# Patient Record
Sex: Female | Born: 1965 | Race: Black or African American | Hispanic: No | Smoking: Former smoker
Health system: Southern US, Community
[De-identification: ages and names within clinical notes are randomized; demographics above are authoritative.]

## PROBLEM LIST (undated history)

## (undated) DIAGNOSIS — R0602 Shortness of breath: Secondary | ICD-10-CM

## (undated) DIAGNOSIS — K449 Diaphragmatic hernia without obstruction or gangrene: Secondary | ICD-10-CM

## (undated) DIAGNOSIS — D649 Anemia, unspecified: Secondary | ICD-10-CM

## (undated) DIAGNOSIS — K589 Irritable bowel syndrome without diarrhea: Secondary | ICD-10-CM

## (undated) DIAGNOSIS — M1990 Unspecified osteoarthritis, unspecified site: Secondary | ICD-10-CM

## (undated) DIAGNOSIS — R011 Cardiac murmur, unspecified: Secondary | ICD-10-CM

## (undated) DIAGNOSIS — K219 Gastro-esophageal reflux disease without esophagitis: Secondary | ICD-10-CM

## (undated) DIAGNOSIS — T4145XA Adverse effect of unspecified anesthetic, initial encounter: Secondary | ICD-10-CM

## (undated) DIAGNOSIS — I10 Essential (primary) hypertension: Secondary | ICD-10-CM

## (undated) DIAGNOSIS — N8110 Cystocele, unspecified: Secondary | ICD-10-CM

## (undated) DIAGNOSIS — C9000 Multiple myeloma not having achieved remission: Secondary | ICD-10-CM

## (undated) DIAGNOSIS — G629 Polyneuropathy, unspecified: Secondary | ICD-10-CM

## (undated) DIAGNOSIS — T8859XA Other complications of anesthesia, initial encounter: Secondary | ICD-10-CM

## (undated) DIAGNOSIS — E7800 Pure hypercholesterolemia, unspecified: Secondary | ICD-10-CM

## (undated) DIAGNOSIS — I639 Cerebral infarction, unspecified: Secondary | ICD-10-CM

## (undated) HISTORY — PX: ESOPHAGOGASTRODUODENOSCOPY: SHX1529

## (undated) HISTORY — DX: Pure hypercholesterolemia, unspecified: E78.00

## (undated) HISTORY — DX: Gastro-esophageal reflux disease without esophagitis: K21.9

## (undated) HISTORY — DX: Shortness of breath: R06.02

## (undated) HISTORY — DX: Unspecified osteoarthritis, unspecified site: M19.90

## (undated) HISTORY — PX: COLONOSCOPY: SHX174

## (undated) HISTORY — DX: Cardiac murmur, unspecified: R01.1

## (undated) HISTORY — DX: Other complications of anesthesia, initial encounter: T88.59XA

## (undated) HISTORY — PX: WISDOM TOOTH EXTRACTION: SHX21

## (undated) HISTORY — DX: Cystocele, unspecified: N81.10

## (undated) HISTORY — PX: UPPER GASTROINTESTINAL ENDOSCOPY: SHX188

## (undated) HISTORY — DX: Anemia, unspecified: D64.9

## (undated) HISTORY — PX: BONE BIOPSY: SHX375

## (undated) HISTORY — PX: TUBAL LIGATION: SHX77

---
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
## 2003-03-21 DIAGNOSIS — C9000 Multiple myeloma not having achieved remission: Secondary | ICD-10-CM

## 2003-03-21 HISTORY — PX: BONE BIOPSY: SHX375

## 2003-03-21 HISTORY — DX: Multiple myeloma not having achieved remission: C90.00

## 2015-03-21 DIAGNOSIS — I639 Cerebral infarction, unspecified: Secondary | ICD-10-CM

## 2015-03-21 HISTORY — DX: Cerebral infarction, unspecified: I63.9

## 2016-12-05 ENCOUNTER — Emergency Department (HOSPITAL_BASED_OUTPATIENT_CLINIC_OR_DEPARTMENT_OTHER): Payer: Self-pay

## 2016-12-05 ENCOUNTER — Inpatient Hospital Stay (HOSPITAL_BASED_OUTPATIENT_CLINIC_OR_DEPARTMENT_OTHER)
Admission: EM | Admit: 2016-12-05 | Discharge: 2016-12-08 | DRG: 065 | Disposition: A | Discharge status: Home Health | Payer: Self-pay | Attending: Internal Medicine | Admitting: Internal Medicine

## 2016-12-05 ENCOUNTER — Encounter (HOSPITAL_BASED_OUTPATIENT_CLINIC_OR_DEPARTMENT_OTHER): Payer: Self-pay | Admitting: *Deleted

## 2016-12-05 DIAGNOSIS — K449 Diaphragmatic hernia without obstruction or gangrene: Secondary | ICD-10-CM | POA: Diagnosis present

## 2016-12-05 DIAGNOSIS — I10 Essential (primary) hypertension: Secondary | ICD-10-CM

## 2016-12-05 DIAGNOSIS — I160 Hypertensive urgency: Secondary | ICD-10-CM | POA: Diagnosis present

## 2016-12-05 DIAGNOSIS — R519 Headache, unspecified: Secondary | ICD-10-CM

## 2016-12-05 DIAGNOSIS — Z823 Family history of stroke: Secondary | ICD-10-CM

## 2016-12-05 DIAGNOSIS — Z87891 Personal history of nicotine dependence: Secondary | ICD-10-CM

## 2016-12-05 DIAGNOSIS — Z885 Allergy status to narcotic agent status: Secondary | ICD-10-CM

## 2016-12-05 DIAGNOSIS — M79605 Pain in left leg: Secondary | ICD-10-CM

## 2016-12-05 DIAGNOSIS — I639 Cerebral infarction, unspecified: Secondary | ICD-10-CM

## 2016-12-05 DIAGNOSIS — G8194 Hemiplegia, unspecified affecting left nondominant side: Secondary | ICD-10-CM | POA: Diagnosis present

## 2016-12-05 DIAGNOSIS — E876 Hypokalemia: Secondary | ICD-10-CM | POA: Diagnosis not present

## 2016-12-05 DIAGNOSIS — Z599 Problem related to housing and economic circumstances, unspecified: Secondary | ICD-10-CM

## 2016-12-05 DIAGNOSIS — Z8249 Family history of ischemic heart disease and other diseases of the circulatory system: Secondary | ICD-10-CM

## 2016-12-05 DIAGNOSIS — R42 Dizziness and giddiness: Secondary | ICD-10-CM

## 2016-12-05 DIAGNOSIS — G629 Polyneuropathy, unspecified: Secondary | ICD-10-CM | POA: Diagnosis present

## 2016-12-05 DIAGNOSIS — C9000 Multiple myeloma not having achieved remission: Secondary | ICD-10-CM | POA: Diagnosis present

## 2016-12-05 DIAGNOSIS — Z9114 Patient's other noncompliance with medication regimen: Secondary | ICD-10-CM

## 2016-12-05 DIAGNOSIS — F1210 Cannabis abuse, uncomplicated: Secondary | ICD-10-CM | POA: Diagnosis present

## 2016-12-05 DIAGNOSIS — I5189 Other ill-defined heart diseases: Secondary | ICD-10-CM

## 2016-12-05 DIAGNOSIS — R51 Headache: Secondary | ICD-10-CM

## 2016-12-05 DIAGNOSIS — K589 Irritable bowel syndrome without diarrhea: Secondary | ICD-10-CM | POA: Diagnosis present

## 2016-12-05 DIAGNOSIS — R000 Tachycardia, unspecified: Secondary | ICD-10-CM

## 2016-12-05 DIAGNOSIS — Q211 Atrial septal defect: Secondary | ICD-10-CM

## 2016-12-05 DIAGNOSIS — H5500 Unspecified nystagmus: Secondary | ICD-10-CM | POA: Diagnosis present

## 2016-12-05 DIAGNOSIS — I6340 Cerebral infarction due to embolism of unspecified cerebral artery: Principal | ICD-10-CM | POA: Diagnosis present

## 2016-12-05 DIAGNOSIS — G8929 Other chronic pain: Secondary | ICD-10-CM | POA: Diagnosis present

## 2016-12-05 HISTORY — DX: Diaphragmatic hernia without obstruction or gangrene: K44.9

## 2016-12-05 HISTORY — DX: Irritable bowel syndrome, unspecified: K58.9

## 2016-12-05 HISTORY — DX: Polyneuropathy, unspecified: G62.9

## 2016-12-05 HISTORY — DX: Essential (primary) hypertension: I10

## 2016-12-05 HISTORY — DX: Multiple myeloma not having achieved remission: C90.00

## 2016-12-05 LAB — CBC WITH DIFFERENTIAL/PLATELET
BASOS PCT: 0 %
Basophils Absolute: 0 10*3/uL (ref 0.0–0.1)
EOS ABS: 0 10*3/uL (ref 0.0–0.7)
Eosinophils Relative: 0 %
HCT: 45 % (ref 36.0–46.0)
Hemoglobin: 15.7 g/dL — ABNORMAL HIGH (ref 12.0–15.0)
Lymphocytes Relative: 52 %
Lymphs Abs: 3.7 10*3/uL (ref 0.7–4.0)
MCH: 28.6 pg (ref 26.0–34.0)
MCHC: 34.9 g/dL (ref 30.0–36.0)
MCV: 82.1 fL (ref 78.0–100.0)
MONO ABS: 0.7 10*3/uL (ref 0.1–1.0)
MONOS PCT: 10 %
Neutro Abs: 2.8 10*3/uL (ref 1.7–7.7)
Neutrophils Relative %: 38 %
Platelets: 190 10*3/uL (ref 150–400)
RBC: 5.48 MIL/uL — ABNORMAL HIGH (ref 3.87–5.11)
RDW: 14.4 % (ref 11.5–15.5)
WBC: 7.2 10*3/uL (ref 4.0–10.5)

## 2016-12-05 LAB — COMPREHENSIVE METABOLIC PANEL
ALBUMIN: 4.6 g/dL (ref 3.5–5.0)
ALK PHOS: 99 U/L (ref 38–126)
ALT: 18 U/L (ref 14–54)
ANION GAP: 13 (ref 5–15)
AST: 34 U/L (ref 15–41)
BUN: 15 mg/dL (ref 6–20)
CHLORIDE: 103 mmol/L (ref 101–111)
CO2: 22 mmol/L (ref 22–32)
Calcium: 10.1 mg/dL (ref 8.9–10.3)
Creatinine, Ser: 0.94 mg/dL (ref 0.44–1.00)
GFR calc Af Amer: >60 mL/min (ref >60)
GFR calc non Af Amer: >60 mL/min (ref >60)
GLUCOSE: 101 mg/dL — AB (ref 65–99)
POTASSIUM: 3.5 mmol/L (ref 3.5–5.1)
SODIUM: 138 mmol/L (ref 135–145)
Total Bilirubin: 1.1 mg/dL (ref 0.3–1.2)
Total Protein: 8.7 g/dL — ABNORMAL HIGH (ref 6.5–8.1)

## 2016-12-05 LAB — LIPASE, BLOOD: Lipase: 54 U/L — ABNORMAL HIGH (ref 11–51)

## 2016-12-05 LAB — URINALYSIS, ROUTINE W REFLEX MICROSCOPIC
BILIRUBIN URINE: NEGATIVE
Glucose, UA: NEGATIVE mg/dL
KETONES UR: 15 mg/dL — AB
Leukocytes, UA: NEGATIVE
Nitrite: NEGATIVE
Protein, ur: NEGATIVE mg/dL
SPECIFIC GRAVITY, URINE: 1.015 (ref 1.005–1.030)
pH: 6 (ref 5.0–8.0)

## 2016-12-05 LAB — RAPID URINE DRUG SCREEN, HOSP PERFORMED
Amphetamines: NOT DETECTED
BARBITURATES: NOT DETECTED
BENZODIAZEPINES: NOT DETECTED
COCAINE: NOT DETECTED
Opiates: NOT DETECTED
Tetrahydrocannabinol: POSITIVE — AB

## 2016-12-05 LAB — URINALYSIS, MICROSCOPIC (REFLEX)

## 2016-12-05 LAB — ETHANOL: Alcohol, Ethyl (B): <5 mg/dL (ref <5)

## 2016-12-05 LAB — PROTIME-INR
INR: 0.96
PROTHROMBIN TIME: 12.7 s (ref 11.4–15.2)

## 2016-12-05 LAB — TROPONIN I

## 2016-12-05 LAB — APTT: aPTT: 41 seconds — ABNORMAL HIGH (ref 24–36)

## 2016-12-05 MED ORDER — SODIUM CHLORIDE 0.9 % IV SOLN
Freq: Once | INTRAVENOUS | Status: AC
  Administered 2016-12-05: 11:00:00 via INTRAVENOUS

## 2016-12-05 MED ORDER — ASPIRIN 300 MG RE SUPP: 300.0000 mg | Freq: Every day | RECTAL | Status: DC

## 2016-12-05 MED ORDER — SODIUM CHLORIDE 0.9 % IV SOLN
INTRAVENOUS | Status: DC
  Administered 2016-12-06 – 2016-12-08 (×3): via INTRAVENOUS

## 2016-12-05 MED ORDER — KETOROLAC TROMETHAMINE 15 MG/ML IJ SOLN
15.0000 mg | Freq: Once | INTRAMUSCULAR | Status: AC
  Administered 2016-12-05: 15 mg via INTRAVENOUS
  Filled 2016-12-05: qty 1

## 2016-12-05 MED ORDER — LABETALOL HCL 5 MG/ML IV SOLN
20.0000 mg | Freq: Once | INTRAVENOUS | Status: AC
  Administered 2016-12-05: 20 mg via INTRAVENOUS
  Filled 2016-12-05: qty 4

## 2016-12-05 MED ORDER — ACETAMINOPHEN 650 MG RE SUPP: 650.0000 mg | RECTAL | Status: DC | PRN

## 2016-12-05 MED ORDER — ASPIRIN 325 MG PO TABS
325.0000 mg | ORAL_TABLET | Freq: Every day | ORAL | Status: DC
  Administered 2016-12-06 – 2016-12-08 (×3): 325 mg via ORAL
  Filled 2016-12-05 (×3): qty 1

## 2016-12-05 MED ORDER — STROKE: EARLY STAGES OF RECOVERY BOOK
Freq: Once | Status: AC
  Administered 2016-12-06: 01:00:00

## 2016-12-05 MED ORDER — ACETAMINOPHEN 500 MG PO TABS
1000.0000 mg | ORAL_TABLET | Freq: Once | ORAL | Status: AC
  Administered 2016-12-05: 1000 mg via ORAL
  Filled 2016-12-05: qty 2

## 2016-12-05 MED ORDER — ACETAMINOPHEN 160 MG/5ML PO SOLN: 650.0000 mg | ORAL | Status: DC | PRN

## 2016-12-05 MED ORDER — ACETAMINOPHEN 325 MG PO TABS
650.0000 mg | ORAL_TABLET | ORAL | Status: DC | PRN
  Administered 2016-12-06 (×2): 650 mg via ORAL
  Filled 2016-12-05 (×2): qty 2

## 2016-12-05 MED ORDER — PROMETHAZINE HCL 25 MG/ML IJ SOLN
12.5000 mg | Freq: Once | INTRAMUSCULAR | Status: AC
  Administered 2016-12-05: 12.5 mg via INTRAVENOUS
  Filled 2016-12-05: qty 1

## 2016-12-05 MED ORDER — HYDRALAZINE HCL 20 MG/ML IJ SOLN: 10.0000 mg | INTRAMUSCULAR | Status: DC | PRN

## 2016-12-05 NOTE — ED Triage Notes (Signed)
Hx of high blood pressure, states she hasn't been on her meds x 1 year. C/o elevated b/p, vertigo, vomiting, body "vibrating" x 3 days

## 2016-12-05 NOTE — ED Notes (Signed)
Numbness persists on L side

## 2016-12-05 NOTE — ED Provider Notes (Signed)
[facility] Provider Note   CSN: [id number] Arrival date & time: 12/05/16  7591     History   Chief Complaint Chief Complaint  Patient presents with  . Emesis    HPI Brittany [name] is a 51 y.o. female.  HPI Patient presents with headaches and vomiting for 3 days. She states that she is numb and vibrating over her whole body. She has a history of hypertension and has been off of her medications for a year. Face also feels numb. States both light and dark bother her but feels better with the lights on. No diarrhea. Has a history of neuropathy in all her nerves are hurting right now. Also has had some constipation but is not unusual with her irritable bowel syndrome. No chest pain. No vision changes. States that she had had some chills also. She has been vomiting stomach contents. She also has a diffuse headache.Patient is complaining that she feels dizzy. Past Medical History:  Diagnosis Date  . Hiatal hernia   . Hypertension   . IBS (irritable bowel syndrome)   . Multiple myeloma ([place])   . Peripheral neuropathy     There are no active problems to display for this patient.   Past Surgical History:  Procedure Laterality Date  . TUBAL LIGATION    . WISDOM TOOTH EXTRACTION      OB History    No data available       Home Medications    Prior to Admission medications   Not on File    Family History No family history on file.  Social History Social History  Substance Use Topics  . Smoking status: Former Smoker    Quit date: 12/06/2015  . Smokeless tobacco: Never Used  . Alcohol use No     Allergies   Other   Review of Systems Review of Systems  Constitutional: Positive for appetite change and chills. Negative for fever.  HENT: Negative for congestion.   Eyes: Positive for photophobia.  Respiratory: Negative for shortness of breath.   Cardiovascular: Negative for chest pain.  Gastrointestinal: Positive for nausea and vomiting.    Genitourinary: Negative for enuresis.  Musculoskeletal: Negative for back pain.  Neurological: Positive for headaches.  Hematological: Negative for adenopathy.  Psychiatric/Behavioral: Negative for confusion.     Physical Exam Updated Vital Signs BP (!) 198/96   Pulse 68   Temp 99.1 F (37.3 C) (Rectal)   Resp 17   Ht _0  (1.549 m)   Wt 72.6 kg (160 lb)   SpO2 98%   BMI 30.23 kg/m   Physical Exam  Constitutional: She appears [name]-developed.  HENT:  Head: Atraumatic.  Eyes: Pupils are equal, round, and reactive to light. EOM are normal.  Neck: Neck supple.  No meningismus  Cardiovascular: Normal rate.   Pulmonary/Chest: Effort normal.  Abdominal: There is tenderness.  Mild diffuse tenderness  Musculoskeletal: She exhibits no tenderness.  Neurological: She is alert.  Patient is moving her extremities. Laying in bed with her eyes closed and besides moving extremities cannot participate too much in the physical exam, particularly ventricular/cerebellar exam. No nystagmus seen.  Skin: Skin is warm. Capillary refill takes less than 2 seconds.     ED Treatments / Results  Labs (all labs ordered are listed, but only abnormal results are displayed) Labs Reviewed  COMPREHENSIVE METABOLIC PANEL - Abnormal; Notable for the following:       Result Value   Glucose, Bld 101 (*)    Total Protein  8.7 (*)    All other components within normal limits  LIPASE, BLOOD - Abnormal; Notable for the following:    Lipase 54 (*)    All other components within normal limits  CBC WITH DIFFERENTIAL/PLATELET - Abnormal; Notable for the following:    RBC 5.48 (*)    Hemoglobin 15.7 (*)    All other components within normal limits  ETHANOL  URINALYSIS, ROUTINE W REFLEX MICROSCOPIC  RAPID URINE DRUG SCREEN, HOSP PERFORMED  TROPONIN I  PROTIME-INR  APTT    EKG  EKG Interpretation  Date/Time:  Tuesday December 05 2016 09:53:11 EDT Ventricular Rate:  90 PR Interval:    QRS  Duration: 77 QT Interval:  358 QTC Calculation: 438 R Axis:   35 Text Interpretation:  Sinus rhythm Borderline T abnormalities, lateral leads Minimal ST elevation, anterior leads Baseline wander in lead(s) III V6 Confirmed by Davonna Belling [phone]) on 12/05/2016 10:00:28 AM       Radiology Ct Head Wo Contrast  Result Date: 12/05/2016 CLINICAL DATA:  Vertigo. Hypertension. History of multiple myeloma. EXAM: CT HEAD WITHOUT CONTRAST TECHNIQUE: Contiguous axial images were obtained from the base of the skull through the vertex without intravenous contrast. COMPARISON:  None. FINDINGS: Brain: The ventricles are normal in size and configuration. There is no demonstrable intracranial mass, hemorrhage, extra-axial fluid collection, or midline shift. There is decreased attenuation in the medial superior right occipital lobe. This appearance is concerning for recent infarct in this area. There is evidence of an age uncertain infarct in the posterior mid left cerebellum. Elsewhere gray-white compartments appear normal. Vascular: There is no appreciable hyperdense vessel. There is no appreciable vascular calcification. Skull: The bony calvarium appears intact. Sinuses/Orbits: There is mucosal thickening in several ethmoid air cells. Other visualized paranasal sinuses clear. Visualized orbits appear symmetric bilaterally. Other: Mastoid air cells are clear. IMPRESSION: 1. Suspect recent/ acute infarct in the superior, medial right occipital lobe. This finding warrants MRI with diffusion imaging to further evaluate. 2.  Age uncertain infarct posterior mid left cerebellum. 3. Gray-white compartments elsewhere appear unremarkable. No mass, hemorrhage, or extra-axial fluid collection. 4.  Mucosal thickening in several ethmoid air cells. Electronically Signed   By: Lowella Grip III M.D.   On: 12/05/2016 10:36    Procedures Procedures (including critical care time)  Medications Ordered in ED Medications    promethazine (PHENERGAN) injection 12.5 mg (12.5 mg Intravenous Given 12/05/16 1052)  0.9 %  sodium chloride infusion ( Intravenous New Bag/Given 12/05/16 1059)     Initial Impression / Assessment and Plan / ED Course  I have reviewed the triage vital signs and the nursing notes.  Pertinent labs & imaging results that were available during my care of the patient were reviewed by me and considered in my medical decision making (see chart for details).     Patient with hypertension and headache dizziness and "vibrating" all over her body area has been noncompliant with her medications. Somewhat difficult to get neurologic examination from due to laying in bed with a rise closed. However CT scan does show possible cerebellar stroke. Blood pressure is somewhat elevated. Will admit to hospitalist. Not a TPA candidate due to time of onset of 2-3 days ago.  Final Clinical Impressions(s) / ED Diagnoses   Final diagnoses:  Nonintractable headache, unspecified chronicity pattern, unspecified headache type  Dizziness  Hypertension, unspecified type  Noncompliance with medication regimen    New Prescriptions New Prescriptions   No medications on file  Davonna Belling, MD 12/05/16 1100

## 2016-12-05 NOTE — ED Notes (Signed)
Pt meal provided at this time.

## 2016-12-05 NOTE — ED Notes (Signed)
Patient transported to CT 

## 2016-12-05 NOTE — ED Notes (Signed)
ED Provider at bedside. 

## 2016-12-05 NOTE — ED Notes (Signed)
Okay for pt to eat/drink per Dr. Alvino [name] after passing stroke swallow screen

## 2016-12-05 NOTE — ED Notes (Signed)
EDP in to assess pt for transport at this time.

## 2016-12-05 NOTE — H&P (Signed)
History and Physical    Brittany [name] EHM:[id number] DOB: 07/19/1965 DOA: 12/05/2016  PCP: Patient, No Pcp Per  Patient coming from: Home.  Chief Complaint: Dizziness.  HPI: Brittany [name] is a 51 y.o. female with history of hypertension who has not been taking medications for the last 3 years due to financial issues presents to the ER at [place] [place] with complaints of persistent dizziness for the last 3 days. Patient also has been having headache mostly in the left side of the head with nausea and vomiting. Denies any visual symptoms or any weakness of the upper or lower extremities. Has been having tingling and numbness of the face and extremities.   ED Course: In the ER patient was found to have uncontrolled blood pressure. CT of the head shows possible stroke involving the cerebellar area. Patient is being admitted for further management of stroke.  Review of Systems: As per HPI, rest all negative.   Past Medical History:  Diagnosis Date  . Hiatal hernia   . Hypertension   . IBS (irritable bowel syndrome)   . Multiple myeloma ([place])   . Peripheral neuropathy     Past Surgical History:  Procedure Laterality Date  . BONE BIOPSY    . TUBAL LIGATION    . WISDOM TOOTH EXTRACTION       reports that she quit smoking about a year ago. She has never used smokeless tobacco. She reports that she does not drink alcohol or use drugs.  Allergies  Allergen Reactions  . Other     States can't take pain meds that end in "cet" or meds that end in "ine"    Family History  Problem Relation Age of Onset  . Hypertension Mother   . Sarcoidosis Mother   . Hypertension Father   . Stroke Maternal Uncle     Prior to Admission medications   Not on File    Physical Exam: Vitals:   12/05/16 2016 12/05/16 2030 12/05/16 2058 12/05/16 2143  BP: (!) 186/118 (!) 185/99 (!) 189/107 (!) 193/97  Pulse:  88  81  Resp: 20 19  20   Temp:    98.8 F (37.1 C)  TempSrc:    Oral    SpO2: 97% 99%  98%  Weight:    73.5 kg (162 lb 0.6 oz)  Height:    5' 1"  (1.549 m)      Constitutional: Moderately built and nourished. Vitals:   12/05/16 2016 12/05/16 2030 12/05/16 2058 12/05/16 2143  BP: (!) 186/118 (!) 185/99 (!) 189/107 (!) 193/97  Pulse:  88  81  Resp: 20 19  20   Temp:    98.8 F (37.1 C)  TempSrc:    Oral  SpO2: 97% 99%  98%  Weight:    73.5 kg (162 lb 0.6 oz)  Height:    5' 1"  (1.549 m)   Eyes: Anicteric no pallor. ENMT: No discharge from the ears eyes nose and mouth. Neck: No neck rigidity no JVD appreciated. Respiratory: No rhonchi or crepitations. Cardiovascular: S1-S2 no murmurs appreciated. Abdomen: Soft nontender bowel sounds present. Musculoskeletal: No edema. No joint effusion. Skin: No rash. Skin appears warm. Neurologic: Alert awake oriented to time place and person. Moves all extremities 5 x 5. No facial asymmetry. Tongue was midline. Pupils are equal and reacting to light. Psychiatric: Appears normal. Normal affect.   Labs on Admission: I have personally reviewed following labs and imaging studies  CBC:  Recent Labs Lab 12/05/16 1014  WBC 7.2  NEUTROABS 2.8  HGB 15.7*  HCT 45.0  MCV 82.1  PLT 818   Basic Metabolic Panel:  Recent Labs Lab 12/05/16 1014  NA 138  K 3.5  CL 103  CO2 22  GLUCOSE 101*  BUN 15  CREATININE 0.94  CALCIUM 10.1   GFR: Estimated Creatinine Clearance: 64.9 mL/min (by C-G formula based on SCr of 0.94 mg/dL). Liver Function Tests:  Recent Labs Lab 12/05/16 1014  AST 34  ALT 18  ALKPHOS 99  BILITOT 1.1  PROT 8.7*  ALBUMIN 4.6    Recent Labs Lab 12/05/16 1014  LIPASE 54*   No results for input(s): AMMONIA in the last 168 hours. Coagulation Profile:  Recent Labs Lab 12/05/16 1014  INR 0.96   Cardiac Enzymes:  Recent Labs Lab 12/05/16 1014  TROPONINI <0.03   BNP (last 3 results) No results for input(s): PROBNP in the last 8760 hours. HbA1C: No results for input(s):  HGBA1C in the last 72 hours. CBG: No results for input(s): GLUCAP in the last 168 hours. Lipid Profile: No results for input(s): CHOL, HDL, LDLCALC, TRIG, CHOLHDL, LDLDIRECT in the last 72 hours. Thyroid Function Tests: No results for input(s): TSH, T4TOTAL, FREET4, T3FREE, THYROIDAB in the last 72 hours. Anemia Panel: No results for input(s): VITAMINB12, FOLATE, FERRITIN, TIBC, IRON, RETICCTPCT in the last 72 hours. Urine analysis:    Component Value Date/Time   COLORURINE YELLOW 12/05/2016 1640   APPEARANCEUR CLEAR 12/05/2016 1640   LABSPEC 1.015 12/05/2016 1640   PHURINE 6.0 12/05/2016 1640   GLUCOSEU NEGATIVE 12/05/2016 1640   HGBUR TRACE (A) 12/05/2016 1640   BILIRUBINUR NEGATIVE 12/05/2016 1640   KETONESUR 15 (A) 12/05/2016 1640   PROTEINUR NEGATIVE 12/05/2016 1640   NITRITE NEGATIVE 12/05/2016 1640   LEUKOCYTESUR NEGATIVE 12/05/2016 1640   Sepsis Labs: @LABRCNTIP (procalcitonin:4,lacticidven:4) )No results found for this or any previous visit (from the past 240 hour(s)).   Radiological Exams on Admission: Ct Head Wo Contrast  Result Date: 12/05/2016 CLINICAL DATA:  Vertigo. Hypertension. History of multiple myeloma. EXAM: CT HEAD WITHOUT CONTRAST TECHNIQUE: Contiguous axial images were obtained from the base of the skull through the vertex without intravenous contrast. COMPARISON:  None. FINDINGS: Brain: The ventricles are normal in size and configuration. There is no demonstrable intracranial mass, hemorrhage, extra-axial fluid collection, or midline shift. There is decreased attenuation in the medial superior right occipital lobe. This appearance is concerning for recent infarct in this area. There is evidence of an age uncertain infarct in the posterior mid left cerebellum. Elsewhere gray-white compartments appear normal. Vascular: There is no appreciable hyperdense vessel. There is no appreciable vascular calcification. Skull: The bony calvarium appears intact.  Sinuses/Orbits: There is mucosal thickening in several ethmoid air cells. Other visualized paranasal sinuses clear. Visualized orbits appear symmetric bilaterally. Other: Mastoid air cells are clear. IMPRESSION: 1. Suspect recent/ acute infarct in the superior, medial right occipital lobe. This finding warrants MRI with diffusion imaging to further evaluate. 2.  Age uncertain infarct posterior mid left cerebellum. 3. Gray-white compartments elsewhere appear unremarkable. No mass, hemorrhage, or extra-axial fluid collection. 4.  Mucosal thickening in several ethmoid air cells. Electronically Signed   By: Lowella Grip III M.D.   On: 12/05/2016 10:36    EKG: Independently reviewed. Normal sinus rhythm with nonspecific ST-T changes.  Assessment/Plan Principal Problem:   Occipital stroke (HCC) Active Problems:   Stroke (cerebrum) (HCC)   Nonintractable headache   Hypertensive urgency   Hypertension    1. Occipital  stroke - discussed with on-call neurologist Dr. Wallie Char who at this time advised to get MRI brain. I have ordered MRI brain and MRA brain 2-D echo carotid Doppler lipid panel and hemoglobin A1c. Physical therapy. 2. Uncontrolled hypertension - for now we will allow for permissive hypertension. If MRI is negative for stroke then would need aggressive blood pressure control. For now I have placed patient on when necessary IV hydralazine for systolic blood pressure more than 583 and diastolic more than 074. 3. Marijuana abuse. 4. History of multiple myeloma per the patient and was treated in [place]. Patient states she is in remission.   DVT prophylaxis: SCDs. Once blood pressure better controlled change to Lovenox. Code Status: Full code.  Family Communication: Family at the bedside.  Disposition Plan: Home.  Consults called: Discussed with neurologist.  Admission status: Inpatient.    Rise Patience MD [facility] Pager [phone].  If 7PM-7AM, please  contact night-coverage [email or web address] Password [email or web address]  12/05/2016, 11:11 PM

## 2016-12-05 NOTE — ED Notes (Signed)
Numbness remains on L side.

## 2016-12-05 NOTE — Progress Notes (Signed)
Patient arrived  [facility]ia [facility] and is admitted to room 5M20.  Telemetry placed.  Patient oriented to room and unit.  [facility] notified of patient's arrival.

## 2016-12-06 ENCOUNTER — Inpatient Hospital Stay (HOSPITAL_COMMUNITY): Payer: Self-pay

## 2016-12-06 DIAGNOSIS — I10 Essential (primary) hypertension: Secondary | ICD-10-CM

## 2016-12-06 DIAGNOSIS — M79605 Pain in left leg: Secondary | ICD-10-CM

## 2016-12-06 DIAGNOSIS — I519 Heart disease, unspecified: Secondary | ICD-10-CM

## 2016-12-06 DIAGNOSIS — Z9114 Patient's other noncompliance with medication regimen: Secondary | ICD-10-CM

## 2016-12-06 DIAGNOSIS — R51 Headache: Secondary | ICD-10-CM

## 2016-12-06 DIAGNOSIS — I160 Hypertensive urgency: Secondary | ICD-10-CM

## 2016-12-06 DIAGNOSIS — R42 Dizziness and giddiness: Secondary | ICD-10-CM

## 2016-12-06 DIAGNOSIS — I5189 Other ill-defined heart diseases: Secondary | ICD-10-CM

## 2016-12-06 DIAGNOSIS — I639 Cerebral infarction, unspecified: Secondary | ICD-10-CM

## 2016-12-06 DIAGNOSIS — E876 Hypokalemia: Secondary | ICD-10-CM

## 2016-12-06 DIAGNOSIS — F1210 Cannabis abuse, uncomplicated: Secondary | ICD-10-CM

## 2016-12-06 DIAGNOSIS — Z87891 Personal history of nicotine dependence: Secondary | ICD-10-CM

## 2016-12-06 DIAGNOSIS — R000 Tachycardia, unspecified: Secondary | ICD-10-CM

## 2016-12-06 LAB — ECHOCARDIOGRAM COMPLETE
HEIGHTINCHES: 61 in
WEIGHTICAEL: 2592.61 [oz_av]

## 2016-12-06 LAB — COMPREHENSIVE METABOLIC PANEL
ALT: 15 U/L (ref 14–54)
ANION GAP: 12 (ref 5–15)
AST: 26 U/L (ref 15–41)
Albumin: 4 g/dL (ref 3.5–5.0)
Alkaline Phosphatase: 85 U/L (ref 38–126)
BUN: 10 mg/dL (ref 6–20)
CHLORIDE: 104 mmol/L (ref 101–111)
CO2: 22 mmol/L (ref 22–32)
Calcium: 9.3 mg/dL (ref 8.9–10.3)
Creatinine, Ser: 0.98 mg/dL (ref 0.44–1.00)
Glucose, Bld: 88 mg/dL (ref 65–99)
Potassium: 3.4 mmol/L — ABNORMAL LOW (ref 3.5–5.1)
SODIUM: 138 mmol/L (ref 135–145)
Total Bilirubin: 0.6 mg/dL (ref 0.3–1.2)
Total Protein: 6.9 g/dL (ref 6.5–8.1)

## 2016-12-06 LAB — VAS US CAROTID
LEFT ECA DIAS: -15 cm/s
LEFT VERTEBRAL DIAS: 18 cm/s
LICAPDIAS: -48 cm/s
LICAPSYS: -161 cm/s
Left CCA dist dias: -31 cm/s
Left CCA dist sys: -133 cm/s
Left CCA prox dias: 24 cm/s
Left CCA prox sys: 160 cm/s
Left ICA dist dias: -37 cm/s
Left ICA dist sys: -136 cm/s
RCCAPDIAS: 23 cm/s
RIGHT ECA DIAS: 19 cm/s
RIGHT VERTEBRAL DIAS: 15 cm/s
Right CCA prox sys: 124 cm/s
Right cca dist sys: -102 cm/s

## 2016-12-06 LAB — LIPID PANEL
CHOLESTEROL: 196 mg/dL (ref 0–200)
HDL: 38 mg/dL — AB (ref >40)
LDL CALC: 139 mg/dL — AB (ref 0–99)
TRIGLYCERIDES: 96 mg/dL (ref <150)
Total CHOL/HDL Ratio: 5.2 RATIO
VLDL: 19 mg/dL (ref 0–40)

## 2016-12-06 LAB — HIV ANTIBODY (ROUTINE TESTING W REFLEX): HIV Screen 4th Generation wRfx: NONREACTIVE

## 2016-12-06 LAB — CBC
HCT: 43.6 % (ref 36.0–46.0)
HEMOGLOBIN: 14.4 g/dL (ref 12.0–15.0)
MCH: 28.2 pg (ref 26.0–34.0)
MCHC: 33 g/dL (ref 30.0–36.0)
MCV: 85.5 fL (ref 78.0–100.0)
PLATELETS: 180 10*3/uL (ref 150–400)
RBC: 5.1 MIL/uL (ref 3.87–5.11)
RDW: 14.2 % (ref 11.5–15.5)
WBC: 6.2 10*3/uL (ref 4.0–10.5)

## 2016-12-06 LAB — HEMOGLOBIN A1C
HEMOGLOBIN A1C: 5.6 % (ref 4.8–5.6)
MEAN PLASMA GLUCOSE: 114.02 mg/dL

## 2016-12-06 LAB — TROPONIN I

## 2016-12-06 MED ORDER — ATORVASTATIN CALCIUM 40 MG PO TABS
40.0000 mg | ORAL_TABLET | Freq: Every day | ORAL | Status: DC
  Administered 2016-12-06 – 2016-12-07 (×2): 40 mg via ORAL
  Filled 2016-12-06 (×3): qty 1

## 2016-12-06 MED ORDER — PROMETHAZINE HCL 25 MG/ML IJ SOLN
12.5000 mg | Freq: Four times a day (QID) | INTRAMUSCULAR | Status: DC | PRN
  Administered 2016-12-06: 12.5 mg via INTRAVENOUS
  Filled 2016-12-06: qty 1

## 2016-12-06 MED ORDER — HYDRALAZINE HCL 20 MG/ML IJ SOLN
10.0000 mg | INTRAMUSCULAR | Status: DC | PRN
  Administered 2016-12-06 (×3): 10 mg via INTRAVENOUS
  Filled 2016-12-06 (×3): qty 1

## 2016-12-06 MED ORDER — ONDANSETRON HCL 4 MG/2ML IJ SOLN
INTRAMUSCULAR | Status: AC
  Administered 2016-12-06: 4 mg
  Filled 2016-12-06: qty 2

## 2016-12-06 MED ORDER — HYDROCODONE-ACETAMINOPHEN 5-325 MG PO TABS: 1.0000 | ORAL_TABLET | Freq: Four times a day (QID) | ORAL | Status: DC | PRN

## 2016-12-06 MED ORDER — POTASSIUM CHLORIDE CRYS ER 20 MEQ PO TBCR
40.0000 meq | EXTENDED_RELEASE_TABLET | Freq: Once | ORAL | Status: AC
  Administered 2016-12-06: 40 meq via ORAL
  Filled 2016-12-06: qty 2

## 2016-12-06 MED ORDER — ONDANSETRON HCL 4 MG/2ML IJ SOLN
4.0000 mg | Freq: Four times a day (QID) | INTRAMUSCULAR | Status: DC | PRN
  Administered 2016-12-06 – 2016-12-08 (×3): 4 mg via INTRAVENOUS
  Filled 2016-12-06 (×3): qty 2

## 2016-12-06 MED ORDER — HYDROCODONE-ACETAMINOPHEN 5-325 MG PO TABS
2.0000 | ORAL_TABLET | Freq: Once | ORAL | Status: AC
  Administered 2016-12-06: 2 via ORAL
  Filled 2016-12-06: qty 2

## 2016-12-06 NOTE — Progress Notes (Signed)
Pt has had nausea with vomiting. Zofran given earlier with no relief. Call MD on call. Order for phenergan given. Patients VSS. Monitoring and will continue to follow and report off to next shift.

## 2016-12-06 NOTE — Progress Notes (Signed)
PROGRESS NOTE    Brittany [name]  TMY:[id number] DOB: 27-Apr-1965 DOA: 12/05/2016 PCP: Patient, No Pcp Per   Chief Complaint  Patient presents with  . Emesis    Brief Narrative:  HPI on 12/05/2016 by Dr. Gean Birchwood  Brittany [name] is a 51 y.o. female with history of hypertension who has not been taking medications for the last 3 years due to financial issues presents to the ER at [facility] [place] with complaints of persistent dizziness for the last 3 days. Patient also has been having headache mostly in the left side of the head with nausea and vomiting. Denies any visual symptoms or any weakness of the upper or lower extremities. Has been having tingling and numbness of the face and extremities.   Assessment & Plan   Acute CVA -CT head: Suspect recent/acute infarct superior, medial right occipital lobe. Age uncertain infarct posterior mid left cerebellum -MRI brain: Acute left inferior cerebellar infarct, left PICA territory. -MRA: Unremarkable with regard to large vessel/vertebral occlusion; left PICA does not show flow related enhancement -Neurology consulted and appreciated -Echocardiogram: EF 14-10%, grade 1 diastolic dysfunction -LDL 139, hemoglobin A1c 5.6 -PT recommended CIR  Uncontrolled hypertension -Allowing for permissive hypertension -Continue IV hydralazine, will change parameters  Marijuana abuse -UDS positive for THC -Counseled  History multiple myeloma -Treated in [place], states she is in remission  Chronic pain -Patient states she was on gabapentin and other medications but cannot remember the names or dosages at this time. Patient is not been on these medications for quite some time due to loss of insurance and having no refills. -Will place patient on hydrocodone as needed -Case management consulted  Hypokalemia -Will replace and continue to monitor BMP  DVT Prophylaxis  SCDs  Code Status: Full  Family Communication: None at  bedside  Disposition Plan: Admitted. Pending stroke workup  Consultants Neurology  Procedures  Echocardiogram  Antibiotics   Anti-infectives    None      Subjective:   Brittany [name] seen and examined today.  Complains of hot flashes. Patient complains of pain in her neck and muscle spasms. States she was taking Neurontin at home however has been out of it. Denies chest pain, shortness of breath, abdominal pain.   Objective:   Vitals:   12/06/16 1235 12/06/16 1258 12/06/16 1401 12/06/16 1413  BP: (!) 209/111 (!) 217/105 (!) 219/98 (!) 197/110  Pulse: (!) 110 (!) 105 99 (!) 104  Resp:    18  Temp: 99.3 F (37.4 C)   98.3 F (36.8 C)  TempSrc: Oral   Oral  SpO2: 100%  98% 97%  Weight:      Height:       No intake or output data in the 24 hours ending 12/06/16 1436 Filed Weights   12/05/16 0949 12/05/16 2143  Weight: 72.6 kg (160 lb) 73.5 kg (162 lb 0.6 oz)    Exam  General: Well developed, well nourished, NAD, appears stated age  63: NCAT, mucous membranes moist.   Cardiovascular: S1 S2 auscultated, RRR, no murmurs  Respiratory: Clear to auscultation bilaterally with equal chest rise  Abdomen: Soft, nontender, nondistended, + bowel sounds  Extremities: warm dry without cyanosis clubbing or edema  Neuro: AAOx3, +nystagmus, strength equal and bilateral in upper/lower ext  Psych: Normal affect and demeanor  Data Reviewed: I have personally reviewed following labs and imaging studies  CBC:  Recent Labs Lab 12/05/16 1014 12/06/16 0749  WBC 7.2 6.2  NEUTROABS 2.8  --  HGB 15.7* 14.4  HCT 45.0 43.6  MCV 82.1 85.5  PLT 190 353   Basic Metabolic Panel:  Recent Labs Lab 12/05/16 1014 12/06/16 0749  NA 138 138  K 3.5 3.4*  CL 103 104  CO2 22 22  GLUCOSE 101* 88  BUN 15 10  CREATININE 0.94 0.98  CALCIUM 10.1 9.3   GFR: Estimated Creatinine Clearance: 62.3 mL/min (by C-G formula based on SCr of 0.98 mg/dL). Liver Function  Tests:  Recent Labs Lab 12/05/16 1014 12/06/16 0749  AST 34 26  ALT 18 15  ALKPHOS 99 85  BILITOT 1.1 0.6  PROT 8.7* 6.9  ALBUMIN 4.6 4.0    Recent Labs Lab 12/05/16 1014  LIPASE 54*   No results for input(s): AMMONIA in the last 168 hours. Coagulation Profile:  Recent Labs Lab 12/05/16 1014  INR 0.96   Cardiac Enzymes:  Recent Labs Lab 12/05/16 1014 12/06/16 0749  TROPONINI <0.03 <0.03   BNP (last 3 results) No results for input(s): PROBNP in the last 8760 hours. HbA1C:  Recent Labs  12/06/16 0749  HGBA1C 5.6   CBG: No results for input(s): GLUCAP in the last 168 hours. Lipid Profile:  Recent Labs  12/06/16 0749  CHOL 196  HDL 38*  LDLCALC 139*  TRIG 96  CHOLHDL 5.2   Thyroid Function Tests: No results for input(s): TSH, T4TOTAL, FREET4, T3FREE, THYROIDAB in the last 72 hours. Anemia Panel: No results for input(s): VITAMINB12, FOLATE, FERRITIN, TIBC, IRON, RETICCTPCT in the last 72 hours. Urine analysis:    Component Value Date/Time   COLORURINE YELLOW 12/05/2016 1640   APPEARANCEUR CLEAR 12/05/2016 1640   LABSPEC 1.015 12/05/2016 1640   PHURINE 6.0 12/05/2016 1640   GLUCOSEU NEGATIVE 12/05/2016 1640   HGBUR TRACE (A) 12/05/2016 1640   BILIRUBINUR NEGATIVE 12/05/2016 1640   KETONESUR 15 (A) 12/05/2016 1640   PROTEINUR NEGATIVE 12/05/2016 1640   NITRITE NEGATIVE 12/05/2016 1640   LEUKOCYTESUR NEGATIVE 12/05/2016 1640   Sepsis Labs: @LABRCNTIP (procalcitonin:4,lacticidven:4)  )No results found for this or any previous visit (from the past 240 hour(s)).    Radiology Studies: Ct Head Wo Contrast  Result Date: 12/05/2016 CLINICAL DATA:  Vertigo. Hypertension. History of multiple myeloma. EXAM: CT HEAD WITHOUT CONTRAST TECHNIQUE: Contiguous axial images were obtained from the base of the skull through the vertex without intravenous contrast. COMPARISON:  None. FINDINGS: Brain: The ventricles are normal in size and configuration. There is  no demonstrable intracranial mass, hemorrhage, extra-axial fluid collection, or midline shift. There is decreased attenuation in the medial superior right occipital lobe. This appearance is concerning for recent infarct in this area. There is evidence of an age uncertain infarct in the posterior mid left cerebellum. Elsewhere gray-white compartments appear normal. Vascular: There is no appreciable hyperdense vessel. There is no appreciable vascular calcification. Skull: The bony calvarium appears intact. Sinuses/Orbits: There is mucosal thickening in several ethmoid air cells. Other visualized paranasal sinuses clear. Visualized orbits appear symmetric bilaterally. Other: Mastoid air cells are clear. IMPRESSION: 1. Suspect recent/ acute infarct in the superior, medial right occipital lobe. This finding warrants MRI with diffusion imaging to further evaluate. 2.  Age uncertain infarct posterior mid left cerebellum. 3. Gray-white compartments elsewhere appear unremarkable. No mass, hemorrhage, or extra-axial fluid collection. 4.  Mucosal thickening in several ethmoid air cells. Electronically Signed   By: Lowella Grip III M.D.   On: 12/05/2016 10:36   Mr Brain Wo Contrast  Result Date: 12/06/2016 CLINICAL DATA:  History of hypertension, not  taking medications, dizziness. History of myeloma. EXAM: MRI HEAD WITHOUT CONTRAST MRA HEAD WITHOUT CONTRAST TECHNIQUE: Multiplanar, multiecho pulse sequences of the brain and surrounding structures were obtained without intravenous contrast. Angiographic images of the head were obtained using MRA technique without contrast. COMPARISON:  CT head 12/05/2016. FINDINGS: MRI HEAD FINDINGS Brain: Multifocal areas of restricted diffusion are seen throughout the LEFT cerebellum, predominantly LEFT posterior inferior cerebellar artery territory, consistent with acute infarction. No brainstem involvement. No significant mass effect, visible hemorrhage, or other similar areas of  acute ischemia. Normal cerebral volume.  No white matter disease. Vascular: Normal flow voids. Skull and upper cervical spine: Normal marrow signal. Sinuses/Orbits: Negative. Other: None. Compared with the CT from yesterday, the LEFT cerebellar infarct is difficult to visualize. The questioned area of hypoattenuation in the RIGHT occipital lobe is artifactual. MRA HEAD FINDINGS The internal carotid arteries are widely patent. Basilar artery is widely patent with vertebrals codominant. There is no proximal stenosis of the anterior, middle, or posterior cerebral arteries. There is patency of the BILATERAL superior cerebellar arteries and BILATERAL anterior inferior cerebellar arteries. The RIGHT posterior inferior cerebellar artery is patent. The LEFT posterior inferior cerebellar artery does not demonstrate flow related enhancement. No saccular aneurysm. IMPRESSION: Acute LEFT inferior cerebellar infarct, LEFT PICA territory. No involvement of the brainstem or other vascular territories. No hemorrhage or mass effect. MRA unremarkable with regard to large vessel/vertebral occlusion; LEFT PICA does not show flow related enhancement. Electronically Signed   By: Staci Righter M.D.   On: 12/06/2016 07:16   Mr Jodene Nam Head Wo Contrast  Result Date: 12/06/2016 CLINICAL DATA:  History of hypertension, not taking medications, dizziness. History of myeloma. EXAM: MRI HEAD WITHOUT CONTRAST MRA HEAD WITHOUT CONTRAST TECHNIQUE: Multiplanar, multiecho pulse sequences of the brain and surrounding structures were obtained without intravenous contrast. Angiographic images of the head were obtained using MRA technique without contrast. COMPARISON:  CT head 12/05/2016. FINDINGS: MRI HEAD FINDINGS Brain: Multifocal areas of restricted diffusion are seen throughout the LEFT cerebellum, predominantly LEFT posterior inferior cerebellar artery territory, consistent with acute infarction. No brainstem involvement. No significant mass effect,  visible hemorrhage, or other similar areas of acute ischemia. Normal cerebral volume.  No white matter disease. Vascular: Normal flow voids. Skull and upper cervical spine: Normal marrow signal. Sinuses/Orbits: Negative. Other: None. Compared with the CT from yesterday, the LEFT cerebellar infarct is difficult to visualize. The questioned area of hypoattenuation in the RIGHT occipital lobe is artifactual. MRA HEAD FINDINGS The internal carotid arteries are widely patent. Basilar artery is widely patent with vertebrals codominant. There is no proximal stenosis of the anterior, middle, or posterior cerebral arteries. There is patency of the BILATERAL superior cerebellar arteries and BILATERAL anterior inferior cerebellar arteries. The RIGHT posterior inferior cerebellar artery is patent. The LEFT posterior inferior cerebellar artery does not demonstrate flow related enhancement. No saccular aneurysm. IMPRESSION: Acute LEFT inferior cerebellar infarct, LEFT PICA territory. No involvement of the brainstem or other vascular territories. No hemorrhage or mass effect. MRA unremarkable with regard to large vessel/vertebral occlusion; LEFT PICA does not show flow related enhancement. Electronically Signed   By: Staci Righter M.D.   On: 12/06/2016 07:16     Scheduled Meds: . aspirin  300 mg Rectal Daily   Or  . aspirin  325 mg Oral Daily   Continuous Infusions: . sodium chloride 50 mL/hr at 12/06/16 0050     LOS: 1 day   Time Spent in minutes   30 minutes  Rahmon Heigl D.O. on 12/06/2016 at 2:36 PM  Between 7am to 7pm - Pager - [phone]  After 7pm go to [email or web address] - password [email or web address]  And look for the night coverage person covering for me after hours  [facility] Office  [phone]

## 2016-12-06 NOTE — Progress Notes (Signed)
Occupational Therapy Evaluation Patient Details Name: Brittany [name] MRN: [id number] DOB: September 04, 1965 Today's Date: 12/06/2016    History of Present Illness 51 y.o. female with history of hypertension who has not been taking medications for the last 3 years due to financial issues presented to the ER at [facility] [place] with complaints of persistent dizziness for 3 days. Patient also has been having headache mostly in the left side of the head with nausea and vomiting. 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 PMH significant for HTN; multiple myeloma and PN.    Clinical Impression   PTA, pt independent with ADL and mobility and helped take care of her mother. Pt currently requiress min A with mobility dn ADL. At this time, recommend CIR to return to independent level of function. Will follow acutely to progress rehab and maximize functional level of independence to facilitate safe DC home.     Follow Up Recommendations  CIR;Supervision/Assistance - 24 hour    Equipment Recommendations  3 in 1 bedside commode    Recommendations for Other Services Rehab consult     Precautions / Restrictions Precautions Precautions: Fall Restrictions Weight Bearing Restrictions: No      Mobility Bed Mobility Overal bed mobility: Needs Assistance Bed Mobility: Supine to Sit     Supine to sit: Supervision     General bed mobility comments: pt sitting OOB in recliner chair upon arrival  Transfers Overall transfer level: Needs assistance Equipment used: None;Rolling walker (2 wheeled) Transfers: Sit to/from Stand Sit to Stand: Min assist;Min guard         General transfer comment: increased time, cautious secondary to dizziness, min guard to min A for stability with transition    Balance Overall balance assessment: Needs assistance Sitting-balance support: Feet supported Sitting balance-Leahy Scale: Fair     Standing balance support: During functional activity;No upper  extremity supported Standing balance-Leahy Scale: Poor Standing balance comment: a when turning head                           ADL either performed or assessed with clinical judgement   ADL Overall ADL's : Needs assistance/impaired     Grooming: Minimal assistance;Standing   Upper Body Bathing: Supervision/ safety;Set up;Sitting   Lower Body Bathing: Minimal assistance;Sit to/from stand   Upper Body Dressing : Minimal assistance;Sitting   Lower Body Dressing: Minimal assistance;Sit to/from stand   Toilet Transfer: Minimal assistance;Ambulation   Toileting- Clothing Manipulation and Hygiene: Moderate assistance;Sit to/from stand       Functional mobility during ADLs: Minimal assistance (very slow ambulation. complains of feeling dizzy)       Vision Baseline Vision/History: Wears glasses Patient Visual Report: Blurring of vision Vision Assessment?: Vision impaired- to be further tested in functional context     Perception     Praxis      Pertinent Vitals/Pain Pain Assessment: No/denies pain     Hand Dominance Right   Extremity/Trunk Assessment Upper Extremity Assessment Upper Extremity Assessment: Defer to OT evaluation LUE Deficits / Details: generalized weakness. hx of painful LUE from PN per pt. States she was unable to to lift LUE above her head PTA due to pain. State she had increaed numbness PTA. LUE Sensation: decreased light touch LUE Coordination: decreased fine motor   Lower Extremity Assessment Lower Extremity Assessment: Defer to PT evaluation   Cervical / Trunk Assessment Cervical / Trunk Assessment: Other exceptions (L bias during dynamic activity)   Communication Communication  Communication: No difficulties   Cognition Arousal/Alertness: Awake/alert Behavior During Therapy: WFL for tasks assessed/performed Overall Cognitive Status: Within Functional Limits for tasks assessed                                 General  Comments: will further assess. Labile?   General Comments       Exercises Exercises: Other exercises Other Exercises Other Exercises: began education on gaze stabilization   Shoulder Instructions      Home Living Family/patient expects to be discharged to:: Private residence Living Arrangements: Parent Available Help at Discharge: Family;Available 24 hours/day Type of Home: House Home Access: Level entry     Home Layout: One level     Bathroom Shower/Tub: Tub/shower unit;Door   [employer] Toilet: Handicapped height Bathroom Accessibility: Yes How Accessible: Accessible via walker Home Equipment: None          Prior Functioning/Environment Level of Independence: Independent        Comments: lmiited driving dur to peripheral neuropathy. 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        OT Problem List: Decreased strength;Decreased activity tolerance;Impaired balance (sitting and/or standing);Impaired vision/perception;Decreased coordination;Decreased safety awareness;Obesity;Pain      OT Treatment/Interventions: Self-care/ADL training;Therapeutic exercise;Neuromuscular education;Energy conservation;DME and/or AE instruction;Therapeutic activities;Patient/family education;Balance training;Visual/perceptual remediation/compensation    OT Goals(Current goals can be found in the care plan section) Acute Rehab OT Goals Patient Stated Goal: return to PLOF OT Goal Formulation: With patient Time For Goal Achievement: 12/20/16 Potential to Achieve Goals: Good  OT Frequency: Min 3X/week   Barriers to D/C:            Co-evaluation              AM-PAC PT "6 Clicks" Daily Activity     Outcome Measure Help from another person eating meals?: None Help from another person taking care of personal grooming?: A Little Help from another person toileting, which includes using toliet, bedpan, or urinal?: A Little Help from another person bathing (including washing,  rinsing, drying)?: A Little Help from another person to put on and taking off regular upper body clothing?: A Little Help from another person to put on and taking off regular lower body clothing?: A Little 6 Click Score: 19   End of Session Equipment Utilized During Treatment: Gait belt Nurse Communication: Mobility status  Activity Tolerance: Patient tolerated treatment well Patient left: in chair;with call bell/phone within reach;with chair alarm set;with family/visitor present  OT Visit Diagnosis: Unsteadiness on feet (R26.81);Muscle weakness (generalized) (M62.81)                Time: 0940-1005 OT Time Calculation (min): 25 min Charges:  OT General Charges $OT Visit: 1 Visit OT Evaluation $OT Eval Moderate Complexity: 1 Mod OT Treatments $Self Care/Home Management : 8-22 mins G-Codes:     [facility], OT/L  [phone] 12/06/2016  Jalynn Waddell,HILLARY 12/06/2016, 3:05 PM

## 2016-12-06 NOTE — Progress Notes (Signed)
*  PRELIMINARY RESULTS* Vascular Ultrasound Carotid Duplex (Doppler) has been completed.  Preliminary findings: Bilateral: No significant (1-39%) ICA stenosis. Antegrade vertebral flow.    Landry Mellow, RDMS, RVT  12/06/2016, 3:43 PM

## 2016-12-06 NOTE — Progress Notes (Signed)
Patient is alert and oriented. C/o neck pain, nausea and dizziness with difficulty breathing. BP elevated but has been since admission, O2 & pulse indicates no distress. MD notified. Patient states she is supposed to take medications but has been unable to because she cannot afford them. Physician aware. Will continue to monitor. Medication given for pain. Monitoring vital signs. Heat pack given for neck. Patient states some improvement with these interventions.

## 2016-12-06 NOTE — Consult Note (Signed)
Requesting Physician: Dr. Ree Kida    Chief Complaint:  stroke  History obtained from:  Patient     HPI:                                                                                                                                         Brittany [name] is an 51 y.o. female with history of hypertension, myeloma that has been treated, irritable bowel, peripheral neuropathy, who has not been taking medications for the last 3 years due to financial issues presents to the ER at [facility] [place] with complaints of persistent dizziness for the last 3 days. Patient also has been having headache mostly in the left side of the head with nausea and vomiting.   Currently having nausea and feeling as if the room is spinning right to left. Feels very weak due to chronic emesis.   Date last known well: Date: 12/03/2016 Time last known well: Unable to determine tPA Given: No: out of window Modified Rankin: Rankin Score=0   Past Medical History:  Diagnosis Date  . Hiatal hernia   . Hypertension   . IBS (irritable bowel syndrome)   . Multiple myeloma ([place])   . Peripheral neuropathy     Past Surgical History:  Procedure Laterality Date  . BONE BIOPSY    . TUBAL LIGATION    . WISDOM TOOTH EXTRACTION      Family History  Problem Relation Age of Onset  . Hypertension Mother   . Sarcoidosis Mother   . Hypertension Father   . Stroke Maternal Uncle    Social History:  reports that she quit smoking about a year ago. She has never used smokeless tobacco. She reports that she does not drink alcohol or use drugs.  Allergies:  Allergies  Allergen Reactions  . Other     States can't take pain meds that end in "cet" or meds that end in "ine"    Medications:                                                                                                                           Scheduled: . aspirin  300 mg Rectal Daily   Or  . aspirin  325 mg Oral Daily    ROS:  History obtained from the patient  General ROS: negative for - chills, fatigue, fever, night sweats, weight gain or weight loss Psychological ROS: negative for - behavioral disorder, hallucinations, memory difficulties, mood swings or suicidal ideation Ophthalmic ROS: negative for - blurry vision, double vision, eye pain or loss of vision ENT ROS: negative for - epistaxis, nasal discharge, oral lesions, sore throat, tinnitus or vertigo Allergy and Immunology ROS: negative for - hives or itchy/watery eyes Hematological and Lymphatic ROS: negative for - bleeding problems, bruising or swollen lymph nodes Endocrine ROS: negative for - galactorrhea, hair pattern changes, polydipsia/polyuria or temperature intolerance Respiratory ROS: negative for - cough, hemoptysis, shortness of breath or wheezing Cardiovascular ROS: negative for - chest pain, dyspnea on exertion, edema or irregular heartbeat Gastrointestinal ROS: negative for - abdominal pain, diarrhea, hematemesis, nausea/vomiting or stool incontinence Genito-Urinary ROS: negative for - dysuria, hematuria, incontinence or urinary frequency/urgency Musculoskeletal ROS: negative for - joint swelling or muscular weakness Neurological ROS: as noted in HPI Dermatological ROS: negative for rash and skin lesion changes  Neurologic Examination:                                                                                                      Blood pressure (!) 188/80, pulse 67, temperature 98.5 F (36.9 C), temperature source Oral, resp. rate 18, height 5' 1"  (1.549 m), weight 73.5 kg (162 lb 0.6 oz), SpO2 98 %.  HEENT-  Normocephalic, no lesions, without obvious abnormality.  Normal external eye and conjunctiva.  Normal TM's bilaterally.  Normal auditory canals and external ears. Normal external nose, mucus membranes and septum.  Normal  pharynx. Cardiovascular- S1, S2 normal, pulses palpable throughout   Lungs- chest clear, no wheezing, rales, normal symmetric air entry Abdomen- normal findings: bowel sounds normal Extremities- no edema Lymph-no adenopathy palpable Musculoskeletal-no joint tenderness, deformity or swelling Skin-warm and dry, no hyperpigmentation, vitiligo, or suspicious lesions  Neurological Examination Mental Status: Alert, oriented, thought content appropriate.  Speech fluent without evidence of aphasia.  Able to follow 3 step commands without difficulty. Cranial Nerves: II:  Visual fields grossly normal,  III,IV, VI: ptosis not present, extra-ocular motions intact bilaterally, pupils equal, round, reactive to light and accommodation--if looking closely there is mild slow phase nystagmis to the right and fast phase to the left. V,VII: smile symmetric, facial light touch sensation normal bilaterally VIII: hearing normal bilaterally IX,X: uvula rises symmetrically XI: bilateral shoulder shrug XII: midline tongue extension Motor: Right : Upper extremity   5/5    Left:     Upper extremity   5/5  Lower extremity   5/5     Lower extremity   5/5 Tone and bulk:normal tone throughout; no atrophy noted Sensory: Pinprick and light touch intact throughout, bilaterally Deep Tendon Reflexes: 2+ and symmetric throughout Plantars: Right: downgoing   Left: downgoing Cerebellar: normal finger-to-nose,  and normal heel-to-shin test Gait: to be tested by PT  Lab Results: Basic Metabolic Panel:  Recent Labs Lab 12/05/16 1014 12/06/16 0749  NA 138 138  K 3.5 3.4*  CL 103 104  CO2  22 22  GLUCOSE 101* 88  BUN 15 10  CREATININE 0.94 0.98  CALCIUM 10.1 9.3    Liver Function Tests:  Recent Labs Lab 12/05/16 1014 12/06/16 0749  AST 34 26  ALT 18 15  ALKPHOS 99 85  BILITOT 1.1 0.6  PROT 8.7* 6.9  ALBUMIN 4.6 4.0    Recent Labs Lab 12/05/16 1014  LIPASE 54*   No results for input(s): AMMONIA  in the last 168 hours.  CBC:  Recent Labs Lab 12/05/16 1014 12/06/16 0749  WBC 7.2 6.2  NEUTROABS 2.8  --   HGB 15.7* 14.4  HCT 45.0 43.6  MCV 82.1 85.5  PLT 190 180    Cardiac Enzymes:  Recent Labs Lab 12/05/16 1014 12/06/16 0749  TROPONINI <0.03 <0.03    Lipid Panel:  Recent Labs Lab 12/06/16 0749  CHOL 196  TRIG 96  HDL 38*  CHOLHDL 5.2  VLDL 19  LDLCALC 139*    CBG: No results for input(s): GLUCAP in the last 168 hours.  Microbiology: No results found for this or any previous visit.  Coagulation Studies:  Recent Labs  12/05/16 1014  LABPROT 12.7  INR 0.96    Imaging: Ct Head Wo Contrast  Result Date: 12/05/2016 CLINICAL DATA:  Vertigo. Hypertension. History of multiple myeloma. EXAM: CT HEAD WITHOUT CONTRAST TECHNIQUE: Contiguous axial images were obtained from the base of the skull through the vertex without intravenous contrast. COMPARISON:  None. FINDINGS: Brain: The ventricles are normal in size and configuration. There is no demonstrable intracranial mass, hemorrhage, extra-axial fluid collection, or midline shift. There is decreased attenuation in the medial superior right occipital lobe. This appearance is concerning for recent infarct in this area. There is evidence of an age uncertain infarct in the posterior mid left cerebellum. Elsewhere gray-white compartments appear normal. Vascular: There is no appreciable hyperdense vessel. There is no appreciable vascular calcification. Skull: The bony calvarium appears intact. Sinuses/Orbits: There is mucosal thickening in several ethmoid air cells. Other visualized paranasal sinuses clear. Visualized orbits appear symmetric bilaterally. Other: Mastoid air cells are clear. IMPRESSION: 1. Suspect recent/ acute infarct in the superior, medial right occipital lobe. This finding warrants MRI with diffusion imaging to further evaluate. 2.  Age uncertain infarct posterior mid left cerebellum. 3. Gray-white  compartments elsewhere appear unremarkable. No mass, hemorrhage, or extra-axial fluid collection. 4.  Mucosal thickening in several ethmoid air cells. Electronically Signed   By: Lowella Grip III M.D.   On: 12/05/2016 10:36   Mr Brain Wo Contrast  Result Date: 12/06/2016 CLINICAL DATA:  History of hypertension, not taking medications, dizziness. History of myeloma. EXAM: MRI HEAD WITHOUT CONTRAST MRA HEAD WITHOUT CONTRAST TECHNIQUE: Multiplanar, multiecho pulse sequences of the brain and surrounding structures were obtained without intravenous contrast. Angiographic images of the head were obtained using MRA technique without contrast. COMPARISON:  CT head 12/05/2016. FINDINGS: MRI HEAD FINDINGS Brain: Multifocal areas of restricted diffusion are seen throughout the LEFT cerebellum, predominantly LEFT posterior inferior cerebellar artery territory, consistent with acute infarction. No brainstem involvement. No significant mass effect, visible hemorrhage, or other similar areas of acute ischemia. Normal cerebral volume.  No white matter disease. Vascular: Normal flow voids. Skull and upper cervical spine: Normal marrow signal. Sinuses/Orbits: Negative. Other: None. Compared with the CT from yesterday, the LEFT cerebellar infarct is difficult to visualize. The questioned area of hypoattenuation in the RIGHT occipital lobe is artifactual. MRA HEAD FINDINGS The internal carotid arteries are widely patent. Basilar artery is widely patent  with vertebrals codominant. There is no proximal stenosis of the anterior, middle, or posterior cerebral arteries. There is patency of the BILATERAL superior cerebellar arteries and BILATERAL anterior inferior cerebellar arteries. The RIGHT posterior inferior cerebellar artery is patent. The LEFT posterior inferior cerebellar artery does not demonstrate flow related enhancement. No saccular aneurysm. IMPRESSION: Acute LEFT inferior cerebellar infarct, LEFT PICA territory. No  involvement of the brainstem or other vascular territories. No hemorrhage or mass effect. MRA unremarkable with regard to large vessel/vertebral occlusion; LEFT PICA does not show flow related enhancement. Electronically Signed   By: Staci Righter M.D.   On: 12/06/2016 07:16   Mr Jodene Nam Head Wo Contrast  Result Date: 12/06/2016 CLINICAL DATA:  History of hypertension, not taking medications, dizziness. History of myeloma. EXAM: MRI HEAD WITHOUT CONTRAST MRA HEAD WITHOUT CONTRAST TECHNIQUE: Multiplanar, multiecho pulse sequences of the brain and surrounding structures were obtained without intravenous contrast. Angiographic images of the head were obtained using MRA technique without contrast. COMPARISON:  CT head 12/05/2016. FINDINGS: MRI HEAD FINDINGS Brain: Multifocal areas of restricted diffusion are seen throughout the LEFT cerebellum, predominantly LEFT posterior inferior cerebellar artery territory, consistent with acute infarction. No brainstem involvement. No significant mass effect, visible hemorrhage, or other similar areas of acute ischemia. Normal cerebral volume.  No white matter disease. Vascular: Normal flow voids. Skull and upper cervical spine: Normal marrow signal. Sinuses/Orbits: Negative. Other: None. Compared with the CT from yesterday, the LEFT cerebellar infarct is difficult to visualize. The questioned area of hypoattenuation in the RIGHT occipital lobe is artifactual. MRA HEAD FINDINGS The internal carotid arteries are widely patent. Basilar artery is widely patent with vertebrals codominant. There is no proximal stenosis of the anterior, middle, or posterior cerebral arteries. There is patency of the BILATERAL superior cerebellar arteries and BILATERAL anterior inferior cerebellar arteries. The RIGHT posterior inferior cerebellar artery is patent. The LEFT posterior inferior cerebellar artery does not demonstrate flow related enhancement. No saccular aneurysm. IMPRESSION: Acute LEFT  inferior cerebellar infarct, LEFT PICA territory. No involvement of the brainstem or other vascular territories. No hemorrhage or mass effect. MRA unremarkable with regard to large vessel/vertebral occlusion; LEFT PICA does not show flow related enhancement. Electronically Signed   By: Staci Righter M.D.   On: 12/06/2016 07:16   Assessment and plan discussed with with attending physician and they are in agreement.    Etta Quill PA-C [facility] [phone]  12/06/2016, 10:01 AM   Attending addendum Patient seen and examined independently Agree with the history and physical as dcoumented above. My assessment and plan as documented below.   Assessment: 50 y.o. female with past medical history of hypertension, multiple myeloma, acute LEFT inferior cerebellar infarct, LEFT PICA territory.  Exam notable for nystagmus otherwise no localizing symptoms.   Stroke Risk Factors - hypertension  Recommend 1. HgbA1c, fasting lipid panel 3. PT consult, OT consult, Speech consult 4. Echocardiogram and TEE if echo is normal  5. 80 mg of Atorvistatin 6. Prophylactic therapy-Antiplatelet med: ASA 325 mg daily 7. Risk factor modification 8. Telemetry monitoring 9. Frequent neuro checks 10 NPO until passes stroke swallow screen 11. Consider hypercoagulable workup and SPEP/UPEP  12 please page stroke NP  Or  PA  Or MD from 8am -4 pm  as this patient from this time will be  followed by the stroke.   You can look them up on [email or web address] [phone]  If 7pm to 7am, please call on  call as listed on AMION.

## 2016-12-06 NOTE — Evaluation (Signed)
Physical Therapy Evaluation Patient Details Name: Brittany [name] MRN: [id number] DOB: [birth year] Today's Date: 12/06/2016   History of Present Illness  51 y.o. female with history of hypertension who has not been taking medications for the last 3 years due to financial issues presented to the ER at [facility] [place] with complaints of persistent dizziness for 3 days. Patient also has been having headache mostly in the left side of the head with nausea and vomiting. 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 PMH significant for HTN; multiple myeloma and PN.   Clinical Impression  Pt presented sitting OOB in recliner chair, awake and willing to participate in therapy session. Prior to admission, pt reported that she was very independent with all functional mobility and ADLs and even assists her parents. Pt limited this session secondary to sustained dizziness in sitting and standing. Pt ambulated within room, with and without an AD. Pt with greater stability when using RW with min guard for safety. Pt is very pleasant and motivated to working with therapy services. PT recommending pt for CIR for further intensive therapy services to maximize her independence prior to returning home. PT will continue to follow acutely for mobility progression.     Follow Up Recommendations CIR    Equipment Recommendations  Other (comment) (defer to next venue)    Recommendations for Other Services Rehab consult     Precautions / Restrictions Precautions Precautions: Fall Restrictions Weight Bearing Restrictions: No      Mobility  Bed Mobility               General bed mobility comments: pt sitting OOB in recliner chair upon arrival  Transfers Overall transfer level: Needs assistance Equipment used: None;Rolling walker (2 wheeled) Transfers: Sit to/from Stand Sit to Stand: Min assist;Min guard         General transfer comment: increased time, cautious secondary to dizziness, min guard  to min A for stability with transition  Ambulation/Gait Ambulation/Gait assistance: Min guard;Min assist Ambulation Distance (Feet): 40 Feet Assistive device: Rolling walker (2 wheeled);None Gait Pattern/deviations: Step-to pattern;Decreased step length - right;Decreased step length - left;Decreased stride length Gait velocity: decreased Gait velocity interpretation: Below normal speed for age/gender General Gait Details: pt ambulated ~5' without an AD with very slow, cautious, labored gait and min A for stability; pt then ambulated with use of RW with improved stability. Distance ambulated limited secondary to dizziness.  Stairs            Wheelchair Mobility    Modified Rankin (Stroke Patients Only) Modified Rankin (Stroke Patients Only) Pre-Morbid Rankin Score: No symptoms Modified Rankin: Moderate disability     Balance Overall balance assessment: Needs assistance Sitting-balance support: Feet supported Sitting balance-Leahy Scale: Fair     Standing balance support: During functional activity;No upper extremity supported Standing balance-Leahy Scale: Poor Standing balance comment: close min guard for static stance without UE support                             Pertinent Vitals/Pain Pain Assessment: No/denies pain    Home Living Family/patient expects to be discharged to:: Private residence Living Arrangements: Parent Available Help at Discharge: Family;Available 24 hours/day Type of Home: House Home Access: Level entry     Home Layout: One level Home Equipment: None      Prior Function Level of Independence: Independent               Hand  Dominance   Dominant Hand: Right    Extremity/Trunk Assessment   Upper Extremity Assessment Upper Extremity Assessment: Defer to OT evaluation    Lower Extremity Assessment Lower Extremity Assessment: Generalized weakness (peripheral neuropathy at baseline)       Communication    Communication: No difficulties  Cognition Arousal/Alertness: Awake/alert Behavior During Therapy: WFL for tasks assessed/performed Overall Cognitive Status: Within Functional Limits for tasks assessed                                 General Comments: cognition not formally assessed but WNL for general conversation      General Comments      Exercises     Assessment/Plan    PT Assessment Patient needs continued PT services  PT Problem List Decreased strength;Decreased activity tolerance;Decreased balance;Decreased mobility;Decreased coordination;Decreased knowledge of use of DME;Decreased safety awareness;Decreased knowledge of precautions       PT Treatment Interventions DME instruction;Gait training;Stair training;Functional mobility training;Therapeutic activities;Balance training;Therapeutic exercise;Neuromuscular re-education;Patient/family education    PT Goals (Current goals can be found in the Care Plan section)  Acute Rehab PT Goals Patient Stated Goal: return to PLOF PT Goal Formulation: With patient Time For Goal Achievement: 12/20/16 Potential to Achieve Goals: Good    Frequency Min 4X/week   Barriers to discharge        Co-evaluation               AM-PAC PT "6 Clicks" Daily Activity  Outcome Measure Difficulty turning over in bed (including adjusting bedclothes, sheets and blankets)?: A Little Difficulty moving from lying on back to sitting on the side of the bed? : A Little Difficulty sitting down on and standing up from a chair with arms (e.g., wheelchair, bedside commode, etc,.)?: A Lot Help needed moving to and from a bed to chair (including a wheelchair)?: A Little Help needed walking in hospital room?: A Little Help needed climbing 3-5 steps with a railing? : A Lot 6 Click Score: 16    End of Session Equipment Utilized During Treatment: Gait belt Activity Tolerance: Other (comment) (limited secondary to reports of  dizziness) Patient left: in chair;with call bell/phone within reach;with chair alarm set Nurse Communication: Mobility status PT Visit Diagnosis: Other abnormalities of gait and mobility (R26.89);Other symptoms and signs involving the nervous system (R29.898)    Time: 1145-1200 PT Time Calculation (min) (ACUTE ONLY): 15 min   Charges:   PT Evaluation $PT Eval Moderate Complexity: 1 Mod     PT G Codes:        [place], PT, DPT [facility] 12/06/2016, 1:48 PM

## 2016-12-06 NOTE — Consult Note (Signed)
Physical Medicine and Rehabilitation Consult Reason for Consult: Dizziness with decreased functional mobility Referring Physician: [facility]  HPI: Brittany [name] is a 51 y.o. right handed female with history of remote tobacco abuse, hypertension who has not been taking his medications for the past few years due to financial issues. Per chart review, patient, and family patient lives with her parents, who are both disabled. Independent driving prior to admission. Presented 12/05/2016 with dizziness as well as headache. Blood pressure 198/96. CT reviewed, showing left cerebellar infarct.  Per CT/MRI report, acute left inferior cerebellar infarction, left PICA territory. MRA unremarkable. Patient did not receive TPA. Urine drug screen was positive for marijuana. Echocardiogram with ejection fraction of 83% grade 1 diastolic dysfunction. Carotid Dopplers are pending. Neurology consulted placed on aspirin for CVA prophylaxis. Physical therapy evaluation completed 12/06/2016 with recommendations of physical medicine rehabilitation consult.   Review of Systems  Constitutional: Negative for chills and fever.  HENT: Negative for hearing loss.   Eyes: Negative for blurred vision and double vision.  Respiratory: Negative for cough and shortness of breath.   Cardiovascular: Negative for chest pain and leg swelling.  Gastrointestinal: Positive for constipation, nausea and vomiting.  Genitourinary: Negative for dysuria and flank pain.  Musculoskeletal: Positive for myalgias.  Skin: Negative for rash.  Neurological: Positive for dizziness, focal weakness and headaches. Negative for seizures.  Psychiatric/Behavioral: The patient is nervous/anxious.   All other systems reviewed and are negative.  Past Medical History:  Diagnosis Date  . Hiatal hernia   . Hypertension   . IBS (irritable bowel syndrome)   . Multiple myeloma ([place])   . Peripheral neuropathy    Past Surgical History:  Procedure  Laterality Date  . BONE BIOPSY    . TUBAL LIGATION    . WISDOM TOOTH EXTRACTION     Family History  Problem Relation Age of Onset  . Hypertension Mother   . Sarcoidosis Mother   . Hypertension Father   . Stroke Maternal Uncle    Social History:  reports that she quit smoking about a year ago. She has never used smokeless tobacco. She reports that she does not drink alcohol or use drugs. Allergies:  Allergies  Allergen Reactions  . Other     States can't take pain meds that end in "cet" or meds that end in "ine"   No prescriptions prior to admission.    Home: Home Living Family/patient expects to be discharged to:: Private residence Living Arrangements: Parent Available Help at Discharge: Family, Available 24 hours/day Type of Home: House Home Access: Level entry [place]: One level Bathroom Shower/Tub: Tub/shower unit, Door [employer] Toilet: Handicapped height Bathroom Accessibility: Yes Home Equipment: None  Functional History: Prior Function Level of Independence: Independent Comments: lmiited driving dur to peripheral neuropathy. 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 Functional Status:  Mobility: Bed Mobility General bed mobility comments: pt sitting OOB in recliner chair upon arrival Transfers Overall transfer level: Needs assistance Equipment used: None, Rolling walker (2 wheeled) Transfers: Sit to/from Stand Sit to Stand: Min assist, Min guard General transfer comment: increased time, cautious secondary to dizziness, min guard to min A for stability with transition Ambulation/Gait Ambulation/Gait assistance: Min guard, Min assist Ambulation Distance (Feet): 40 Feet Assistive device: Rolling walker (2 wheeled), None Gait Pattern/deviations: Step-to pattern, Decreased step length - right, Decreased step length - left, Decreased stride length General Gait Details: pt ambulated ~5' without an AD with very slow, cautious, labored gait and  min A for  stability; pt then ambulated with use of RW with improved stability. Distance ambulated limited secondary to dizziness. Gait velocity: decreased Gait velocity interpretation: Below normal speed for age/gender    ADL:    Cognition: Cognition Overall Cognitive Status: Within Functional Limits for tasks assessed Arousal/Alertness: Awake/alert Orientation Level: Oriented X4 Attention: Sustained Sustained Attention: Appears intact Memory: Impaired Memory Impairment: Retrieval deficit (recalled 4/5 words independently, one with cue) Awareness: Appears intact Problem Solving: Appears intact Safety/Judgment: Appears intact Cognition Arousal/Alertness: Awake/alert Behavior During Therapy: WFL for tasks assessed/performed Overall Cognitive Status: Within Functional Limits for tasks assessed General Comments: cognition not formally assessed but WNL for general conversation  Blood pressure (!) 197/110, pulse (!) 104, temperature 98.3 F (36.8 C), temperature source Oral, resp. rate 18, height '5\' 1"'$  (1.549 m), weight 73.5 kg (162 lb 0.6 oz), SpO2 97 %. Physical Exam  Constitutional: She is oriented to person, place, and time. She appears well-developed and well-nourished.  HENT:  Head: Normocephalic and atraumatic.  Eyes: EOM are normal. Right eye exhibits no discharge. Left eye exhibits no discharge.  Neck: Normal range of motion. Neck supple. Thyromegaly present.  Cardiovascular: Normal rate, regular rhythm and normal heart sounds.   Respiratory: Effort normal and breath sounds normal. No respiratory distress.  GI: Soft. Bowel sounds are normal. She exhibits no distension.  Musculoskeletal: She exhibits no edema or tenderness.  Neurological: She is alert and oriented to person, place, and time.  She follows commands.  Fair awareness of deficits. Motor: RUE/RLE: 4+/5 proximal to distal LUE/LLE: 4-/5 (component of pain inhibition)  Skin: Skin is warm and dry.  Psychiatric: Her mood  appears anxious.    Results for orders placed or performed during the hospital encounter of 12/05/16 (from the past 24 hour(s))  Urinalysis, Routine w reflex microscopic     Status: Abnormal   Collection Time: 12/05/16  4:40 PM  Result Value Ref Range   Color, Urine YELLOW YELLOW   APPearance CLEAR CLEAR   Specific Gravity, Urine 1.015 1.005 - 1.030   pH 6.0 5.0 - 8.0   Glucose, UA NEGATIVE NEGATIVE mg/dL   Hgb urine dipstick TRACE (A) NEGATIVE   Bilirubin Urine NEGATIVE NEGATIVE   Ketones, ur 15 (A) NEGATIVE mg/dL   Protein, ur NEGATIVE NEGATIVE mg/dL   Nitrite NEGATIVE NEGATIVE   Leukocytes, UA NEGATIVE NEGATIVE  Urine rapid drug screen (hosp performed)     Status: Abnormal   Collection Time: 12/05/16  4:40 PM  Result Value Ref Range   Opiates NONE DETECTED NONE DETECTED   Cocaine NONE DETECTED NONE DETECTED   Benzodiazepines NONE DETECTED NONE DETECTED   Amphetamines NONE DETECTED NONE DETECTED   Tetrahydrocannabinol POSITIVE (A) NONE DETECTED   Barbiturates NONE DETECTED NONE DETECTED  Urinalysis, Microscopic (reflex)     Status: Abnormal   Collection Time: 12/05/16  4:40 PM  Result Value Ref Range   RBC / HPF 0-5 0 - 5 RBC/hpf   WBC, UA 0-5 0 - 5 WBC/hpf   Bacteria, UA FEW (A) NONE SEEN   Squamous Epithelial / LPF 6-30 (A) NONE SEEN   Mucus PRESENT    Hyaline Casts, UA PRESENT   HIV antibody (Routine Testing)     Status: None   Collection Time: 12/06/16  7:49 AM  Result Value Ref Range   HIV Screen 4th Generation wRfx Non Reactive Non Reactive  Hemoglobin A1c     Status: None   Collection Time: 12/06/16  7:49 AM  Result Value  Ref Range   Hgb A1c MFr Bld 5.6 4.8 - 5.6 %   Mean Plasma Glucose 114.02 mg/dL  Lipid panel     Status: Abnormal   Collection Time: 12/06/16  7:49 AM  Result Value Ref Range   Cholesterol 196 0 - 200 mg/dL   Triglycerides 96 <150 mg/dL   HDL 38 (L) >40 mg/dL   Total CHOL/HDL Ratio 5.2 RATIO   VLDL 19 0 - 40 mg/dL   LDL Cholesterol 139  (H) 0 - 99 mg/dL  Comprehensive metabolic panel     Status: Abnormal   Collection Time: 12/06/16  7:49 AM  Result Value Ref Range   Sodium 138 135 - 145 mmol/L   Potassium 3.4 (L) 3.5 - 5.1 mmol/L   Chloride 104 101 - 111 mmol/L   CO2 22 22 - 32 mmol/L   Glucose, Bld 88 65 - 99 mg/dL   BUN 10 6 - 20 mg/dL   Creatinine, Ser 0.98 0.44 - 1.00 mg/dL   Calcium 9.3 8.9 - 10.3 mg/dL   Total Protein 6.9 6.5 - 8.1 g/dL   Albumin 4.0 3.5 - 5.0 g/dL   AST 26 15 - 41 U/L   ALT 15 14 - 54 U/L   Alkaline Phosphatase 85 38 - 126 U/L   Total Bilirubin 0.6 0.3 - 1.2 mg/dL   GFR calc non Af Amer >60 >60 mL/min   GFR calc Af Amer >60 >60 mL/min   Anion gap 12 5 - 15  CBC     Status: None   Collection Time: 12/06/16  7:49 AM  Result Value Ref Range   WBC 6.2 4.0 - 10.5 K/uL   RBC 5.10 3.87 - 5.11 MIL/uL   Hemoglobin 14.4 12.0 - 15.0 g/dL   HCT 43.6 36.0 - 46.0 %   MCV 85.5 78.0 - 100.0 fL   MCH 28.2 26.0 - 34.0 pg   MCHC 33.0 30.0 - 36.0 g/dL   RDW 14.2 11.5 - 15.5 %   Platelets 180 150 - 400 K/uL  Troponin I     Status: None   Collection Time: 12/06/16  7:49 AM  Result Value Ref Range   Troponin I <0.03 <0.03 ng/mL   Ct Head Wo Contrast  Result Date: 12/05/2016 CLINICAL DATA:  Vertigo. Hypertension. History of multiple myeloma. EXAM: CT HEAD WITHOUT CONTRAST TECHNIQUE: Contiguous axial images were obtained from the base of the skull through the vertex without intravenous contrast. COMPARISON:  None. FINDINGS: Brain: The ventricles are normal in size and configuration. There is no demonstrable intracranial mass, hemorrhage, extra-axial fluid collection, or midline shift. There is decreased attenuation in the medial superior right occipital lobe. This appearance is concerning for recent infarct in this area. There is evidence of an age uncertain infarct in the posterior mid left cerebellum. Elsewhere gray-white compartments appear normal. Vascular: There is no appreciable hyperdense vessel. There  is no appreciable vascular calcification. Skull: The bony calvarium appears intact. Sinuses/Orbits: There is mucosal thickening in several ethmoid air cells. Other visualized paranasal sinuses clear. Visualized orbits appear symmetric bilaterally. Other: Mastoid air cells are clear. IMPRESSION: 1. Suspect recent/ acute infarct in the superior, medial right occipital lobe. This finding warrants MRI with diffusion imaging to further evaluate. 2.  Age uncertain infarct posterior mid left cerebellum. 3. Gray-white compartments elsewhere appear unremarkable. No mass, hemorrhage, or extra-axial fluid collection. 4.  Mucosal thickening in several ethmoid air cells. Electronically Signed   By: Lowella Grip III M.D.   On:  12/05/2016 10:36   Mr Brain Wo Contrast  Result Date: 12/06/2016 CLINICAL DATA:  History of hypertension, not taking medications, dizziness. History of myeloma. EXAM: MRI HEAD WITHOUT CONTRAST MRA HEAD WITHOUT CONTRAST TECHNIQUE: Multiplanar, multiecho pulse sequences of the brain and surrounding structures were obtained without intravenous contrast. Angiographic images of the head were obtained using MRA technique without contrast. COMPARISON:  CT head 12/05/2016. FINDINGS: MRI HEAD FINDINGS Brain: Multifocal areas of restricted diffusion are seen throughout the LEFT cerebellum, predominantly LEFT posterior inferior cerebellar artery territory, consistent with acute infarction. No brainstem involvement. No significant mass effect, visible hemorrhage, or other similar areas of acute ischemia. Normal cerebral volume.  No white matter disease. Vascular: Normal flow voids. Skull and upper cervical spine: Normal marrow signal. Sinuses/Orbits: Negative. Other: None. Compared with the CT from yesterday, the LEFT cerebellar infarct is difficult to visualize. The questioned area of hypoattenuation in the RIGHT occipital lobe is artifactual. MRA HEAD FINDINGS The internal carotid arteries are widely patent.  Basilar artery is widely patent with vertebrals codominant. There is no proximal stenosis of the anterior, middle, or posterior cerebral arteries. There is patency of the BILATERAL superior cerebellar arteries and BILATERAL anterior inferior cerebellar arteries. The RIGHT posterior inferior cerebellar artery is patent. The LEFT posterior inferior cerebellar artery does not demonstrate flow related enhancement. No saccular aneurysm. IMPRESSION: Acute LEFT inferior cerebellar infarct, LEFT PICA territory. No involvement of the brainstem or other vascular territories. No hemorrhage or mass effect. MRA unremarkable with regard to large vessel/vertebral occlusion; LEFT PICA does not show flow related enhancement. Electronically Signed   By: Staci Righter M.D.   On: 12/06/2016 07:16   Mr Jodene Nam Head Wo Contrast  Result Date: 12/06/2016 CLINICAL DATA:  History of hypertension, not taking medications, dizziness. History of myeloma. EXAM: MRI HEAD WITHOUT CONTRAST MRA HEAD WITHOUT CONTRAST TECHNIQUE: Multiplanar, multiecho pulse sequences of the brain and surrounding structures were obtained without intravenous contrast. Angiographic images of the head were obtained using MRA technique without contrast. COMPARISON:  CT head 12/05/2016. FINDINGS: MRI HEAD FINDINGS Brain: Multifocal areas of restricted diffusion are seen throughout the LEFT cerebellum, predominantly LEFT posterior inferior cerebellar artery territory, consistent with acute infarction. No brainstem involvement. No significant mass effect, visible hemorrhage, or other similar areas of acute ischemia. Normal cerebral volume.  No white matter disease. Vascular: Normal flow voids. Skull and upper cervical spine: Normal marrow signal. Sinuses/Orbits: Negative. Other: None. Compared with the CT from yesterday, the LEFT cerebellar infarct is difficult to visualize. The questioned area of hypoattenuation in the RIGHT occipital lobe is artifactual. MRA HEAD FINDINGS  The internal carotid arteries are widely patent. Basilar artery is widely patent with vertebrals codominant. There is no proximal stenosis of the anterior, middle, or posterior cerebral arteries. There is patency of the BILATERAL superior cerebellar arteries and BILATERAL anterior inferior cerebellar arteries. The RIGHT posterior inferior cerebellar artery is patent. The LEFT posterior inferior cerebellar artery does not demonstrate flow related enhancement. No saccular aneurysm. IMPRESSION: Acute LEFT inferior cerebellar infarct, LEFT PICA territory. No involvement of the brainstem or other vascular territories. No hemorrhage or mass effect. MRA unremarkable with regard to large vessel/vertebral occlusion; LEFT PICA does not show flow related enhancement. Electronically Signed   By: Staci Righter M.D.   On: 12/06/2016 07:16    Assessment/Plan: Diagnosis: Left inferior cerebellar infarct Labs and images independently reviewed.  Records reviewed and summated above. Stroke: Continue secondary stroke prophylaxis and Risk Factor Modification listed below:   Antiplatelet therapy:  Blood Pressure Management:  Continue current medication with prn's with permisive HTN per primary team Statin Agent:   Left sided hemiparesis: fit for orthosis to prevent contractures (resting hand splint for day, wrist cock up splint at night, PRAFO) Motor recovery: Fluoxetine  1. Does the need for close, 24 hr/day medical supervision in concert with the patient's rehab needs make it unreasonable for this patient to be served in a less intensive setting? Potentially 2. Co-Morbidities requiring supervision/potential complications: diastolic dysfunction (monitor for signs/symptoms of fluid overload), marijuana abuse (counsel), remote tobacco abuse, HTN with hypertensive emergency (monitor and provide prns in accordance with increased physical exertion and pain), Tachycardia (monitor in accordance with pain and increasing  activity), hypokalemia (continue to monitor and replete as necessary) 3. Due to safety, disease management, pain management and patient education, does the patient require 24 hr/day rehab nursing? Potentially 4. Does the patient require coordinated care of a physician, rehab nurse, PT (1-2 hrs/day, 5 days/week), OT (1-2 hrs/day, 5 days/week) and SLP (1-2 hrs/day, 5 days/week) to address physical and functional deficits in the context of the above medical diagnosis(es)? Potentially Addressing deficits in the following areas: balance, endurance, locomotion, strength, transferring, bathing, dressing, toileting, cognition and psychosocial support 5. Can the patient actively participate in an intensive therapy program of at least 3 hrs of therapy per day at least 5 days per week? Yes 6. The potential for patient to make measurable gains while on inpatient rehab is good and fair 7. Anticipated functional outcomes upon discharge from inpatient rehab are modified independent and supervision  with PT, modified independent and supervision with OT, independent and modified independent with SLP. 8. Estimated rehab length of stay to reach the above functional goals is: 4-7 days. 9. Anticipated D/C setting: Home 10. Anticipated post D/C treatments: HH therapy and Home excercise program 11. Overall Rehab/Functional Prognosis: good  RECOMMENDATIONS: This patient's condition is appropriate for continued rehabilitative care in the following setting: Patient doing functionally well on day of eval.  Anticipate pt will continue to progress and not require inpatient rehab by the time she is medically stable for discharge. Recommend follow up with PM&R as outpt. Patient has agreed to participate in recommended program. Potentially Note that insurance prior authorization may be required for reimbursement for recommended care.  Comment: Rehab Admissions Coordinator to follow up.  Delice Lesch, MD, ABPMR Lauraine Rinne  J., PA-C 12/06/2016

## 2016-12-06 NOTE — Care Management Note (Signed)
Case Management Note  Patient Details  Name: Brittany [name] MRN: [id number] Date of Birth: February 14, 1966  Subjective/Objective:   Pt admitted with CVA. She is from home with her parents.                  Action/Plan: PT recommending CIR. Awaiting OT eval. CM also consulted d/t patient not having insurance or PCP. CM inquired about her attending one of the [facility]. CM was able to obtain her an appointment at the [facility]. Information on the AVS.  CM also went over the use of the pharmacy at the [facility] while she is active with the clinic to assist with the cost of her medications.  If patient d/c late in the day or over the weekend she may need a [place] letter. CM following.  Expected Discharge Date:  12/08/16               Expected Discharge Plan:  [place]  In-House Referral:     Discharge planning Services  CM Consult, [facility], Medication Assistance  Post Acute Care Choice:    Choice offered to:     DME Arranged:    DME Agency:     HH Arranged:    HH Agency:     Status of Service:  In process, will continue to follow  If discussed at Long Length of Stay Meetings, dates discussed:    Additional Comments:  Pollie Friar, RN 12/06/2016, 2:05 PM

## 2016-12-06 NOTE — Progress Notes (Signed)
  Echocardiogram 2D Echocardiogram has been performed.  Brittany [name] 12/06/2016, 9:31 AM

## 2016-12-06 NOTE — Evaluation (Signed)
Speech Language Pathology Evaluation Patient Details Name: Brittany [name] MRN: [id number] DOB: [birth year] Today's Date: 12/06/2016 Time: 8675-4492 SLP Time Calculation (min) (ACUTE ONLY): 25 min  Problem List:  Patient Active Problem List   Diagnosis Date Noted  . Stroke (cerebrum) ([place]) 12/05/2016  . Nonintractable headache 12/05/2016  . 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 Hypertensive urgency 12/05/2016  . Hypertension    Past Medical History:  Past Medical History:  Diagnosis Date  . Hiatal hernia   . Hypertension   . IBS (irritable bowel syndrome)   . Multiple myeloma ([place])   . Peripheral neuropathy    Past Surgical History:  Past Surgical History:  Procedure Laterality Date  . BONE BIOPSY    . TUBAL LIGATION    . WISDOM TOOTH EXTRACTION     HPI:  51 yo female adm to [facility] with left sided weakness and nausea vomiting. Pt found to have occipital lobe and cerebellar CVA. PMH + for MM, HTN, IBS.  Speech evaluation ordered.  Pt has not worked outside of the home due to her medical issues.     Assessment / Plan / Recommendation Clinical Impression  MOCA 7.3 administered to patient *only verbal portions due to nausea from cerebellar CVA -  Pt scored 20/25 - areas of strengths include attention, language, abstract thought, orientation and recall.  Deficits included mental math task - use of written information facilitated her ability.   Lower left facial sensation is decreased compared to right.  Pt is not aphasic nor dysarthric.  SLP educated her to findings/recommendations.  No SLP follow up needed.     SLP Assessment  SLP Recommendation/Assessment: Patient does not need any further Speech Lanaguage Pathology Services SLP Visit Diagnosis: Cognitive communication deficit (R41.841)    Follow Up Recommendations  None    Frequency and Duration           SLP Evaluation Cognition  Overall Cognitive Status: Within Functional Limits for tasks assessed Arousal/Alertness:  Awake/alert Orientation Level: Oriented X4 Attention: Sustained Sustained Attention: Appears intact Memory: Impaired Memory Impairment: Retrieval deficit (recalled 4/5 words independently, one with cue) Awareness: Appears intact Problem Solving: Appears intact Safety/Judgment: Appears intact       Comprehension  Auditory Comprehension Yes/No Questions: Not tested Commands: Within Functional Limits Conversation: Complex Visual Recognition/Discrimination Discrimination: Not tested Reading Comprehension Reading Status: Not tested (secondary to pt's vision difficulties and nausea)    Expression Expression Primary Mode of Expression: Verbal Verbal Expression Overall Verbal Expression: Appears within functional limits for tasks assessed Initiation: No impairment Repetition: No impairment Naming: No impairment Pragmatics: No impairment Written Expression Dominant Hand: Right Written Expression: Not tested   Oral / Motor  Oral Motor/Sensory Function Overall Oral Motor/Sensory Function: Mild impairment Facial Sensation: Reduced left (reduced left lower facial sensation) Motor Speech Overall Motor Speech: Appears within functional limits for tasks assessed Respiration: Within functional limits Resonance: Within functional limits Articulation: Within functional limitis Intelligibility: Intelligible Motor Planning: Witnin functional limits Motor Speech Errors: Not applicable Effective Techniques: Slow rate   GO                    Macario Golds 12/06/2016, 11:29 AM  Luanna Salk, [facility] SLP [phone]

## 2016-12-07 LAB — BASIC METABOLIC PANEL
ANION GAP: 6 (ref 5–15)
BUN: 10 mg/dL (ref 6–20)
CALCIUM: 9.4 mg/dL (ref 8.9–10.3)
CHLORIDE: 106 mmol/L (ref 101–111)
CO2: 26 mmol/L (ref 22–32)
Creatinine, Ser: 0.9 mg/dL (ref 0.44–1.00)
GFR calc non Af Amer: >60 mL/min (ref >60)
GLUCOSE: 97 mg/dL (ref 65–99)
POTASSIUM: 3.6 mmol/L (ref 3.5–5.1)
Sodium: 138 mmol/L (ref 135–145)

## 2016-12-07 LAB — MAGNESIUM: Magnesium: 1.9 mg/dL (ref 1.7–2.4)

## 2016-12-07 LAB — CBC
HCT: 43.7 % (ref 36.0–46.0)
Hemoglobin: 14.4 g/dL (ref 12.0–15.0)
MCH: 28 pg (ref 26.0–34.0)
MCHC: 33 g/dL (ref 30.0–36.0)
MCV: 84.9 fL (ref 78.0–100.0)
Platelets: 190 10*3/uL (ref 150–400)
RBC: 5.15 MIL/uL — ABNORMAL HIGH (ref 3.87–5.11)
RDW: 14 % (ref 11.5–15.5)
WBC: 9.3 10*3/uL (ref 4.0–10.5)

## 2016-12-07 LAB — SEDIMENTATION RATE: SED RATE: 8 mm/h (ref 0–22)

## 2016-12-07 MED ORDER — LISINOPRIL 20 MG PO TABS
20.0000 mg | ORAL_TABLET | Freq: Every day | ORAL | Status: DC
  Administered 2016-12-07 – 2016-12-08 (×2): 20 mg via ORAL
  Filled 2016-12-07 (×2): qty 1

## 2016-12-07 NOTE — Progress Notes (Signed)
PROGRESS NOTE    Brittany [name]  WHQ:[id number] DOB: Oct 30, 1965 DOA: 12/05/2016 PCP: Patient, No Pcp Per   Chief Complaint  Patient presents with  . Emesis    Brief Narrative:  HPI on 12/05/2016 by Dr. Gean Birchwood  Brittany [name] is a 51 y.o. female with history of hypertension who has not been taking medications for the last 3 years due to financial issues presents to the ER at [place]., [place] with complaints of persistent dizziness for the last 3 days. Patient also has been having headache mostly in the left side of the head with nausea and vomiting. Denies any visual symptoms or any weakness of the upper or lower extremities. Has been having tingling and numbness of the face and extremities.   Assessment & Plan   Acute CVA -CT head: Suspect recent/acute infarct superior, medial right occipital lobe. Age uncertain infarct posterior mid left cerebellum -MRI brain: Acute left inferior cerebellar infarct, left PICA territory. -MRA: Unremarkable with regard to large vessel/vertebral occlusion; left PICA does not show flow related enhancement -Neurology consulted and appreciated -carotid doppler: No significant ICA stenosis. Antegrade vertebral flow -Echocardiogram: EF 65-99%, grade 1 diastolic dysfunction -LDL 139, hemoglobin A1c 5.6 -PT/OT recommended CIR. CIR consulted and felt patient did not inpatient rehab -PT now recommending home health -Discussed with Dr. Leonie Man, recommended patient have TEE and loop recorder however this cannot be done until Monday 12/11/2016. Also recommended ESR, ANA  Uncontrolled hypertension -Allowing for permissive hypertension -Continue IV hydralazine -Patient does not know what medication she was on in the past. We'll start her on lisinopril  Marijuana abuse -UDS positive for THC -Counseled  History multiple myeloma -Treated in [place], states she is in remission  Chronic pain -Patient states she was on gabapentin and other medications  but cannot remember the names or dosages at this time. Patient is not been on these medications for quite some time due to loss of insurance and having no refills. -Will place patient on hydrocodone as needed -Case management consulted  Hypokalemia -Resolved, continue to monitor BMP  Nausea -Possibly secondary to stroke -Continue antiemetics as needed  DVT Prophylaxis  SCDs  Code Status: Full  Family Communication: Family at bedside  Disposition Plan: Admitted. Pending TEE  Consultants Neurology  Procedures  Echocardiogram  Antibiotics   Anti-infectives    None      Subjective:   Brittany [name] seen and examined today.  Patient feeling mildly better today. Feels nausea and vomiting have mildly improved. Denies chest pain, short of breath, abdominal pain, diarrhea or constipation.  Objective:   Vitals:   12/07/16 0118 12/07/16 0528 12/07/16 1127 12/07/16 1440  BP: (!) 167/76 (!) 156/100 (!) 204/93 (!) 189/86  Pulse: (!) 125 (!) 106 100 86  Resp: 18 18 20    Temp: 99.7 F (37.6 C) 99.1 F (37.3 C) 97.7 F (36.5 C) 98.3 F (36.8 C)  TempSrc: Oral Oral Oral   SpO2: 100% 100% 98% 100%  Weight:      Height:        Intake/Output Summary (Last 24 hours) at 12/07/16 1545 Last data filed at 12/07/16 0900  Gross per 24 hour  Intake          1598.33 ml  Output                0 ml  Net          1598.33 ml   Filed Weights   12/05/16 0949 12/05/16 2143  Weight: 72.6  kg (160 lb) 73.5 kg (162 lb 0.6 oz)   Exam  General: Well developed, well nourished, NAD, appears stated age  HEENT: NCAT, mucous membranes moist.   Cardiovascular: S1 S2 auscultated, no rubs, murmurs or gallops. Regular rate and rhythm.  Respiratory: Clear to auscultation bilaterally with equal chest rise  Abdomen: Soft, nontender, nondistended, + bowel sounds  Extremities: warm dry without cyanosis clubbing or edema  Neuro: AAOx3, nonfocal  Psych: Appropriate   Data Reviewed: I have  personally reviewed following labs and imaging studies  CBC:  Recent Labs Lab 12/05/16 1014 12/06/16 0749 12/07/16 0355  WBC 7.2 6.2 9.3  NEUTROABS 2.8  --   --   HGB 15.7* 14.4 14.4  HCT 45.0 43.6 43.7  MCV 82.1 85.5 84.9  PLT 190 180 960   Basic Metabolic Panel:  Recent Labs Lab 12/05/16 1014 12/06/16 0749 12/07/16 0355  NA 138 138 138  K 3.5 3.4* 3.6  CL 103 104 106  CO2 22 22 26   GLUCOSE 101* 88 97  BUN 15 10 10   CREATININE 0.94 0.98 0.90  CALCIUM 10.1 9.3 9.4  MG  --   --  1.9   GFR: Estimated Creatinine Clearance: 67.8 mL/min (by C-G formula based on SCr of 0.9 mg/dL). Liver Function Tests:  Recent Labs Lab 12/05/16 1014 12/06/16 0749  AST 34 26  ALT 18 15  ALKPHOS 99 85  BILITOT 1.1 0.6  PROT 8.7* 6.9  ALBUMIN 4.6 4.0    Recent Labs Lab 12/05/16 1014  LIPASE 54*   No results for input(s): AMMONIA in the last 168 hours. Coagulation Profile:  Recent Labs Lab 12/05/16 1014  INR 0.96   Cardiac Enzymes:  Recent Labs Lab 12/05/16 1014 12/06/16 0749  TROPONINI <0.03 <0.03   BNP (last 3 results) No results for input(s): PROBNP in the last 8760 hours. HbA1C:  Recent Labs  12/06/16 0749  HGBA1C 5.6   CBG: No results for input(s): GLUCAP in the last 168 hours. Lipid Profile:  Recent Labs  12/06/16 0749  CHOL 196  HDL 38*  LDLCALC 139*  TRIG 96  CHOLHDL 5.2   Thyroid Function Tests: No results for input(s): TSH, T4TOTAL, FREET4, T3FREE, THYROIDAB in the last 72 hours. Anemia Panel: No results for input(s): VITAMINB12, FOLATE, FERRITIN, TIBC, IRON, RETICCTPCT in the last 72 hours. Urine analysis:    Component Value Date/Time   COLORURINE YELLOW 12/05/2016 1640   APPEARANCEUR CLEAR 12/05/2016 1640   LABSPEC 1.015 12/05/2016 1640   PHURINE 6.0 12/05/2016 1640   GLUCOSEU NEGATIVE 12/05/2016 1640   HGBUR TRACE (A) 12/05/2016 1640   BILIRUBINUR NEGATIVE 12/05/2016 1640   KETONESUR 15 (A) 12/05/2016 1640   PROTEINUR  NEGATIVE 12/05/2016 1640   NITRITE NEGATIVE 12/05/2016 1640   LEUKOCYTESUR NEGATIVE 12/05/2016 1640   Sepsis Labs: @LABRCNTIP (procalcitonin:4,lacticidven:4)  )No results found for this or any previous visit (from the past 240 hour(s)).    Radiology Studies: Mr Brain Wo Contrast  Result Date: 12/06/2016 CLINICAL DATA:  History of hypertension, not taking medications, dizziness. History of myeloma. EXAM: MRI HEAD WITHOUT CONTRAST MRA HEAD WITHOUT CONTRAST TECHNIQUE: Multiplanar, multiecho pulse sequences of the brain and surrounding structures were obtained without intravenous contrast. Angiographic images of the head were obtained using MRA technique without contrast. COMPARISON:  CT head 12/05/2016. FINDINGS: MRI HEAD FINDINGS Brain: Multifocal areas of restricted diffusion are seen throughout the LEFT cerebellum, predominantly LEFT posterior inferior cerebellar artery territory, consistent with acute infarction. No brainstem involvement. No significant mass effect,  visible hemorrhage, or other similar areas of acute ischemia. Normal cerebral volume.  No white matter disease. Vascular: Normal flow voids. Skull and upper cervical spine: Normal marrow signal. Sinuses/Orbits: Negative. Other: None. Compared with the CT from yesterday, the LEFT cerebellar infarct is difficult to visualize. The questioned area of hypoattenuation in the RIGHT occipital lobe is artifactual. MRA HEAD FINDINGS The internal carotid arteries are widely patent. Basilar artery is widely patent with vertebrals codominant. There is no proximal stenosis of the anterior, middle, or posterior cerebral arteries. There is patency of the BILATERAL superior cerebellar arteries and BILATERAL anterior inferior cerebellar arteries. The RIGHT posterior inferior cerebellar artery is patent. The LEFT posterior inferior cerebellar artery does not demonstrate flow related enhancement. No saccular aneurysm. IMPRESSION: Acute LEFT inferior cerebellar  infarct, LEFT PICA territory. No involvement of the brainstem or other vascular territories. No hemorrhage or mass effect. MRA unremarkable with regard to large vessel/vertebral occlusion; LEFT PICA does not show flow related enhancement. Electronically Signed   By: Staci Righter M.D.   On: 12/06/2016 07:16   Mr Jodene Nam Head Wo Contrast  Result Date: 12/06/2016 CLINICAL DATA:  History of hypertension, not taking medications, dizziness. History of myeloma. EXAM: MRI HEAD WITHOUT CONTRAST MRA HEAD WITHOUT CONTRAST TECHNIQUE: Multiplanar, multiecho pulse sequences of the brain and surrounding structures were obtained without intravenous contrast. Angiographic images of the head were obtained using MRA technique without contrast. COMPARISON:  CT head 12/05/2016. FINDINGS: MRI HEAD FINDINGS Brain: Multifocal areas of restricted diffusion are seen throughout the LEFT cerebellum, predominantly LEFT posterior inferior cerebellar artery territory, consistent with acute infarction. No brainstem involvement. No significant mass effect, visible hemorrhage, or other similar areas of acute ischemia. Normal cerebral volume.  No white matter disease. Vascular: Normal flow voids. Skull and upper cervical spine: Normal marrow signal. Sinuses/Orbits: Negative. Other: None. Compared with the CT from yesterday, the LEFT cerebellar infarct is difficult to visualize. The questioned area of hypoattenuation in the RIGHT occipital lobe is artifactual. MRA HEAD FINDINGS The internal carotid arteries are widely patent. Basilar artery is widely patent with vertebrals codominant. There is no proximal stenosis of the anterior, middle, or posterior cerebral arteries. There is patency of the BILATERAL superior cerebellar arteries and BILATERAL anterior inferior cerebellar arteries. The RIGHT posterior inferior cerebellar artery is patent. The LEFT posterior inferior cerebellar artery does not demonstrate flow related enhancement. No saccular  aneurysm. IMPRESSION: Acute LEFT inferior cerebellar infarct, LEFT PICA territory. No involvement of the brainstem or other vascular territories. No hemorrhage or mass effect. MRA unremarkable with regard to large vessel/vertebral occlusion; LEFT PICA does not show flow related enhancement. Electronically Signed   By: Staci Righter M.D.   On: 12/06/2016 07:16     Scheduled Meds: . aspirin  300 mg Rectal Daily   Or  . aspirin  325 mg Oral Daily  . atorvastatin  40 mg Oral q1800   Continuous Infusions: . sodium chloride 50 mL/hr at 12/06/16 2023     LOS: 2 days   Time Spent in minutes   30 minutes  Tashianna Broome D.O. on 12/07/2016 at 3:45 PM  Between 7am to 7pm - Pager - [phone]  After 7pm go to [email or web address] - password [email or web address]  And look for the night coverage person covering for me after hours  [facility] Office  [phone]

## 2016-12-07 NOTE — Progress Notes (Addendum)
PT/OT recommending CIR. CIR feels she is doing too well and can do rehab elsewhere. CM asked Jermaine with [facility] to follow her for possible [facility] services. MD aware. Pt with orders for walker and 3 in 1. Jermaine with [facility] aware and will deliver to the room.  CM following.

## 2016-12-07 NOTE — Progress Notes (Signed)
OT is now recommending HH. Dr. Delice Lesch also recommends [facility] due to functional improvement. RN CM is aware that pt is not an inpt rehab candidate at this time. We will sign off at this time. [phone]

## 2016-12-07 NOTE — Progress Notes (Signed)
STROKE TEAM PROGRESS NOTE   HISTORY OF PRESENT ILLNESS (per record) Brittany [name] is an 51 y.o. female with history of hypertension, multiple myeloma that has been treated, irritable bowel, peripheral neuropathy, who has not been taking medications for the last 3 years due to financial issues presents to the ER at [place]., [place] with complaints of persistent dizziness for the last 3 days. Patient also has been having headache mostly in the left side of the head with nausea and vomiting.   Currently having nausea and feeling as if the room is spinning right to left. Feels very weak due to chronic emesis.   Date last known well: Date: 12/03/2016 Time last known well: Unable to determine Modified Rankin: Rankin Score=0   Patient was not administered IV t-PA secondary to arriving outside of the tPA treatment window. She was admitted to General Neurology for further evaluation and treatment.   SUBJECTIVE (INTERVAL HISTORY) Her parents are at the bedside.  The pt states she was diagnosed with peripheral neuropathy in [place], but did not complete workup to diagnose cause due to financial issues.  She is not a diabetic.  Pt recently moved from [place] to live with her parents, and needs to establish care with a local PCP.  Pt states she is unemployed, and considers herself disabled due to neuropathy, but has not yet qualified for [employer].  TEE and loop recorder insertion tomorrow.   OBJECTIVE Temp:  [97.7 F (36.5 C)-99.7 F (37.6 C)] 98.3 F (36.8 C) (09/20 1440) Pulse Rate:  [86-127] 86 (09/20 1440) Cardiac Rhythm: Sinus tachycardia (09/20 0704) Resp:  [18-20] 20 (09/20 1127) BP: (151-222)/(67-115) 189/86 (09/20 1440) SpO2:  [98 %-100 %] 100 % (09/20 1440)  CBC:   Recent Labs Lab 12/05/16 1014 12/06/16 0749 12/07/16 0355  WBC 7.2 6.2 9.3  NEUTROABS 2.8  --   --   HGB 15.7* 14.4 14.4  HCT 45.0 43.6 43.7  MCV 82.1 85.5 84.9  PLT 190 180 657    Basic Metabolic Panel:    Recent Labs Lab 12/06/16 0749 12/07/16 0355  NA 138 138  K 3.4* 3.6  CL 104 106  CO2 22 26  GLUCOSE 88 97  BUN 10 10  CREATININE 0.98 0.90  CALCIUM 9.3 9.4  MG  --  1.9    Lipid Panel:     Component Value Date/Time   CHOL 196 12/06/2016 0749   TRIG 96 12/06/2016 0749   HDL 38 (L) 12/06/2016 0749   CHOLHDL 5.2 12/06/2016 0749   VLDL 19 12/06/2016 0749   LDLCALC 139 (H) 12/06/2016 0749   HgbA1c:  Lab Results  Component Value Date   HGBA1C 5.6 12/06/2016   Urine Drug Screen:     Component Value Date/Time   LABOPIA NONE DETECTED 12/05/2016 1640   COCAINSCRNUR NONE DETECTED 12/05/2016 1640   LABBENZ NONE DETECTED 12/05/2016 1640   AMPHETMU NONE DETECTED 12/05/2016 1640   THCU POSITIVE (A) 12/05/2016 1640   LABBARB NONE DETECTED 12/05/2016 1640    Alcohol Level     Component Value Date/Time   ETH <5 12/05/2016 1014    IMAGING  Mr Brain Wo Contrast 12/06/2016 IMPRESSION: Acute LEFT inferior cerebellar infarct, LEFT PICA territory. No involvement of the brainstem or other vascular territories. No hemorrhage or mass effect.   Mr Virgel Paling Wo Contrast 12/06/2016 MRA unremarkable with regard to large vessel/vertebral occlusion; LEFT PICA does not show flow related enhancement.    Carotid US 12/06/2016 Summary: - The vertebral arteries  appear patent with antegrade flow. - Findings consistent with 1-39 percent stenosis involving the right internal carotid artery and the left internal carotid artery.  TTE 12/06/2016 Study Conclusions - Left ventricle: The cavity size was normal. There was severe focal basal and mild concentric hypertrophy. Systolic function was vigorous. The estimated ejection fraction was in the range of 65% to 70%. Wall motion was normal; there were no regional wall   motion abnormalities. There was an increased relative contribution of atrial contraction to ventricular filling.  Doppler parameters are consistent with abnormal left ventricular  relaxation (grade 1 diastolic dysfunction). - Aortic valve: Valve area (Vmax): 1.75 cm^2. - Mitral valve: There was trivial regurgitation. Valve area by pressure half-time: 1.55 cm^2. - Atrial septum: There was increased thickness of the septum, consistent with lipomatous hypertrophy. - Pulmonary arteries: Systolic pressure could not be accurately estimated.  TEE 12/08/2016 pending   PHYSICAL EXAM Middle aged african american lady not in distress. . Afebrile. Head is nontraumatic. Neck is supple without bruit.    Cardiac exam no murmur or gallop. Lungs are clear to auscultation. Distal pulses are well felt. Neurological Exam ;  Awake  Alert oriented x 3. Normal speech and language.eye movements full without nystagmus.fundi were not visualized. Vision acuity and fields appear normal. Hearing is normal. Palatal movements are normal. Face symmetric. Tongue midline. Normal strength, tone, reflexes and coordination. Subjective diminished touch/pinprick  Sensation. From thigh down bilaterally Gait deferred.  ASSESSMENT/PLAN [name] is a 51 y.o. female with history of hypertension, myeloma that has been treated, irritable bowel, peripheral neuropathy, who has not been taking medications for the last 3 years due to financial issues presents to the ER at [place]., [place] with complaints of persistent dizziness for the last 3 days. Patient also has been having headache mostly in the left side of the head with nausea and vomiting. She did not receive IV t-PA due to arriving outside of the tPA treatment window.   Stroke: Acute LEFT inferior cerebellar infarct, LEFT PICA territory, likely embolic, of undetermined etiology.  TEE on 12/08/2016.  Resultant  Subjective dizziness  CT head: not performed  MRI head: Acute LEFT inferior cerebellar infarct, LEFT PICA territory.  MRA head: No LVo or high-grade stenosis  Carotid Doppler : B ICA 1-39% stenosis, VAs antegrade  2D Echo: EF 65-70%.  No source of embolus   LDL 139  HgbA1c 5.6  SCDs for VTE prophylaxis Diet Heart Room service appropriate? Yes; Fluid consistency: Thin Diet NPO time specified  No antithrombotic prior to admission, now on aspirin 325 mg daily  Patient counseled to be compliant with her antithrombotic medications  Ongoing aggressive stroke risk factor management  Therapy recommendations: CIR  Disposition: pending  Hypertension  Stable  Permissive hypertension (OK if < 220/120) but gradually normalize in 5-7 days  Long-term BP goal normotensive  Hyperlipidemia  Home meds: none  LDL 139, goal < 70  Add atorvastatin 40 mg PO daily  Continue statin at discharge  Other Stroke Risk Factors  Obesity, Body mass index is 30.62 kg/m., recommend weight loss, diet and exercise as appropriate   Family hx stroke (uncle)  History of cancer (multiple myeloma)  Other Active Problems  UDS positive for [facility] day # 2  I have personally examined this patient, reviewed notes, independently viewed imaging studies, participated in medical decision making and plan of care.ROS completed by me personally and pertinent positives fully documented  I have made any additions or clarifications  directly to the above note.  She presented with several days of dizziness secondary to embolic left posterior cerebellar infarct. Start aspirin, statin and check TEE and cardiac monitoring for A. Fib. Long discussion with patient and parents and answered questions. Greater than 50% time during this 35 minute visit was spent on counseling and coordination of care about her embolic stroke, evaluation and treatment plan discussion. Antony Contras, MD Medical Director [place] Pager: [phone] 12/07/2016 8:43 PM   To contact Stroke Continuity provider, please refer to [email or web address] After hours, contact General Neurology

## 2016-12-07 NOTE — Progress Notes (Signed)
Occupational Therapy Treatment Patient Details Name: Brittany [name] MRN: [id number] DOB: [birth year] Today's Date: 12/07/2016    History of present illness 51 y.o. female with history of hypertension who has not been taking medications for the last 3 years due to financial issues presented to the ER at [place] [place] with complaints of persistent dizziness for 3 days. Patient also has been having headache mostly in the left side of the head with nausea and vomiting. 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 PMH significant for HTN; multiple myeloma and PN.    OT comments  Pt making good progress. Completed ADL with overall set up using RW and compensatory strategies to maximize independence and reduce risk of falls. Pt reports feeling better and that she only feels unsteady instead of feeling that the room is "spinning". Will follow acutely to address home safety to reduce risk of falling and maximize independence in the home. Recommend HHOT,   Follow Up Recommendations  Home health OT;Supervision - Intermittent    Equipment Recommendations  3 in 1 bedside commode    Recommendations for Other Services      Precautions / Restrictions Precautions Precautions: Fall Restrictions Weight Bearing Restrictions: No       Mobility Bed Mobility Overal bed mobility: Modified Independent Bed Mobility: Supine to Sit     Supine to sit: Supervision        Transfers Overall transfer level: Needs assistance Equipment used: Rolling walker (2 wheeled) Transfers: Sit to/from Stand Sit to Stand: Supervision         General transfer comment: increased time, cautious secondary to dizziness, min guard for stability with transition    Balance Overall balance assessment: Needs assistance Sitting-balance support: Feet supported Sitting balance-Leahy Scale: Good     Standing balance support: During functional activity Standing balance-Leahy Scale: Fair                             ADL either performed or assessed with clinical judgement   ADL               Lower Body Bathing: Set up;Sit to/from stand           Toilet Transfer: Supervision/safety;RW;Comfort height toilet;Ambulation   Toileting- Clothing Manipulation and Hygiene: Supervision/safety;Sit to/from stand       Functional mobility during ADLs: Supervision/safety;Rolling walker General ADL Comments: Completed ADL task wtih S after set up. Pt able to complete bathing and dressing. Educatedon strategies to reduce likelihood of provoking feelings of "ddizziness" during ADl tasks. Required min vc to follow.      Vision  Pt reports vision is better. Lights kept off during session per pt's request     Perception     Praxis      Cognition Arousal/Alertness: Awake/alert Behavior During Therapy:  (tearful at times); "childlike behavior" at times Overall Cognitive Status: No family/caregiver present to determine baseline cognitive functioning Area of Impairment: Memory;Problem solving                     Memory: Decreased short-term memory       Problem Solving: Decreased initiation          Exercises     Shoulder Instructions       General Comments      Pertinent Vitals/ Pain       Pain Assessment: No/denies pain  Home Living  Prior Functioning/Environment              Frequency  Min 3X/week        Progress Toward Goals  OT Goals(current goals can now be found in the care plan section)  Progress towards OT goals: Progressing toward goals  Acute Rehab OT Goals Patient Stated Goal: return to PLOF OT Goal Formulation: With patient Time For Goal Achievement: 12/20/16 Potential to Achieve Goals: Good ADL Goals Pt Will Perform Lower Body Bathing: with modified independence;sit to/from stand Pt Will Perform Lower Body Dressing: with modified independence;sit to/from stand Pt Will  Transfer to Toilet: with modified independence;ambulating Pt Will Perform Toileting - Clothing Manipulation and hygiene: with modified independence;sit to/from stand Pt Will Perform Tub/Shower Transfer: Tub transfer;ambulating;with modified independence;rolling walker Additional ADL Goal #1: Pt will demonstrate gaze stabilization techniques independently during ADL to decrease symptoms of dizziness and reduce risk of falls.  Plan Discharge plan needs to be updated;Frequency remains appropriate    Co-evaluation                 AM-PAC PT "6 Clicks" Daily Activity     Outcome Measure   Help from another person eating meals?: None Help from another person taking care of personal grooming?: None Help from another person toileting, which includes using toliet, bedpan, or urinal?: A Little Help from another person bathing (including washing, rinsing, drying)?: None Help from another person to put on and taking off regular upper body clothing?: None Help from another person to put on and taking off regular lower body clothing?: None 6 Click Score: 23    End of Session Equipment Utilized During Treatment: Gait belt;Rolling walker  OT Visit Diagnosis: Unsteadiness on feet (R26.81);Muscle weakness (generalized) (M62.81)   Activity Tolerance Patient tolerated treatment well   Patient Left     Nurse Communication          Time: 0930-1010 OT Time Calculation (min): 40 min  Charges: OT General Charges $OT Visit: 1 Visit OT Treatments $Self Care/Home Management : 38-52 mins  [facility], OT/L  [phone] 12/07/2016   Rockie Schnoor,HILLARY 12/07/2016, 10:21 AM

## 2016-12-07 NOTE — Progress Notes (Signed)
Physical Therapy Treatment Patient Details Name: Brittany [name] MRN: [id number] DOB: 12-Oct-1965 Today's Date: 12/07/2016    History of Present Illness 51 y.o. female with history of hypertension who has not been taking medications for the last 3 years due to financial issues presented to the ER at [facility] [place] with complaints of persistent dizziness for 3 days. Patient also has been having headache mostly in the left side of the head with nausea and vomiting. 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 PMH significant for HTN; multiple myeloma and PN.     PT Comments    Pt making slow progress with mobility and continues to be limited secondary to sustained dizziness and nausea. Pt was denied for CIR and therefore d/c recommendations have been updated to HHPT with 24/7 supervision/assist for safety. Pt would continue to benefit from skilled physical therapy services at this time while admitted and after d/c to address the below listed limitations in order to improve overall safety and independence with functional mobility.    Follow Up Recommendations  Home health PT;Supervision/Assistance - 24 hour     Equipment Recommendations  Rolling walker with 5" wheels    Recommendations for Other Services       Precautions / Restrictions Precautions Precautions: Fall Restrictions Weight Bearing Restrictions: No    Mobility  Bed Mobility Overal bed mobility: Modified Independent Bed Mobility: Supine to Sit     Supine to sit: Supervision        Transfers Overall transfer level: Needs assistance Equipment used: Rolling walker (2 wheeled) Transfers: Sit to/from Stand Sit to Stand: Supervision         General transfer comment: increased time, cautious secondary to dizziness, min guard for stability with transition  Ambulation/Gait Ambulation/Gait assistance: Min guard Ambulation Distance (Feet): 50 Feet Assistive device: Rolling walker (2 wheeled) Gait  Pattern/deviations: Step-to pattern;Decreased step length - right;Decreased step length - left;Decreased stride length Gait velocity: decreased Gait velocity interpretation: Below normal speed for age/gender General Gait Details: pt with very slow, cautious gait pattern and reports of dizziness throughout (limiting distance)   Stairs            Wheelchair Mobility    Modified Rankin (Stroke Patients Only) Modified Rankin (Stroke Patients Only) Pre-Morbid Rankin Score: No symptoms Modified Rankin: Moderate disability     Balance Overall balance assessment: Needs assistance Sitting-balance support: Feet supported Sitting balance-Leahy Scale: Good     Standing balance support: During functional activity Standing balance-Leahy Scale: Fair                              Cognition Arousal/Alertness: Awake/alert Behavior During Therapy:  (tearful at times) Overall Cognitive Status: No family/caregiver present to determine baseline cognitive functioning Area of Impairment: Memory;Problem solving                     Memory: Decreased short-term memory       Problem Solving: Decreased initiation        Exercises      General Comments        Pertinent Vitals/Pain Pain Assessment: No/denies pain    Home Living                      Prior Function            PT Goals (current goals can now be found in the care plan section) Acute Rehab PT  Goals Patient Stated Goal: return to PLOF PT Goal Formulation: With patient Time For Goal Achievement: 12/20/16 Potential to Achieve Goals: Good Progress towards PT goals: Progressing toward goals    Frequency    Min 4X/week      PT Plan Discharge plan needs to be updated    Co-evaluation              AM-PAC PT "6 Clicks" Daily Activity  Outcome Measure  Difficulty turning over in bed (including adjusting bedclothes, sheets and blankets)?: A Little Difficulty moving from lying  on back to sitting on the side of the bed? : A Little Difficulty sitting down on and standing up from a chair with arms (e.g., wheelchair, bedside commode, etc,.)?: A Lot Help needed moving to and from a bed to chair (including a wheelchair)?: A Little Help needed walking in hospital room?: A Little Help needed climbing 3-5 steps with a railing? : A Lot 6 Click Score: 16    End of Session Equipment Utilized During Treatment: Gait belt Activity Tolerance: Other (comment) (limited secondary to dizziness and nausea) Patient left: in bed;with call bell/phone within reach;Other (comment) (sitting EOB with OT in room) Nurse Communication: Mobility status PT Visit Diagnosis: Other abnormalities of gait and mobility (R26.89);Other symptoms and signs involving the nervous system (T05.697)     Time: 9480-1655 PT Time Calculation (min) (ACUTE ONLY): 14 min  Charges:  $Gait Training: 8-22 mins                    G Codes:       [place], [place], [place] [employer] 12/07/2016, 11:04 AM

## 2016-12-07 NOTE — Progress Notes (Signed)
Pt observed to be lethargic in bed with her sheets wet due to wet wash cloth, vomited several times during day shift, had iv Zofran with little effect, day shift RN earlier called for Phernegan 12.5mg  order, same given at 2013, pt cleaned up with complete change of bed sheets. Pt's BP read 200/96, iv hydralazine 10mg  given accordingly at 2031. Pt reassured and made comfortable in bed, call light and family at bedside, will however continue to monitor. Obasogie-Asidi, Lilybelle Mayeda Efe

## 2016-12-07 NOTE — Progress Notes (Signed)
    [facility] has been requested to perform a transesophageal echocardiogram on 12/08/16 for CVA.  After careful review of history and examination, the risks and benefits of transesophageal echocardiogram have been explained including risks of esophageal damage, perforation (1:10,000 risk), bleeding, pharyngeal hematoma as well as other potential complications associated with conscious sedation including aspiration, arrhythmia, respiratory failure and death. Alternatives to treatment were discussed, questions were answered. Patient is willing to proceed.   Labs and vital signs are stable today.  Reino Bellis, NP-C 12/07/2016 4:44 PM

## 2016-12-08 ENCOUNTER — Inpatient Hospital Stay (HOSPITAL_COMMUNITY): Payer: Self-pay | Admitting: Certified Registered Nurse Anesthetist

## 2016-12-08 ENCOUNTER — Inpatient Hospital Stay (HOSPITAL_COMMUNITY): Payer: Self-pay

## 2016-12-08 ENCOUNTER — Encounter (HOSPITAL_COMMUNITY)
Admission: EM | Disposition: A | Discharge status: Home Health | Payer: Self-pay | Source: Home / Self Care | Attending: Internal Medicine

## 2016-12-08 ENCOUNTER — Encounter (HOSPITAL_COMMUNITY): Payer: Self-pay | Admitting: Emergency Medicine

## 2016-12-08 DIAGNOSIS — I638 Other cerebral infarction: Secondary | ICD-10-CM

## 2016-12-08 DIAGNOSIS — R2990 Unspecified symptoms and signs involving the nervous system: Secondary | ICD-10-CM

## 2016-12-08 HISTORY — PX: TEE WITHOUT CARDIOVERSION: SHX5443

## 2016-12-08 LAB — BASIC METABOLIC PANEL
Anion gap: 8 (ref 5–15)
BUN: 9 mg/dL (ref 6–20)
CALCIUM: 9.3 mg/dL (ref 8.9–10.3)
CHLORIDE: 104 mmol/L (ref 101–111)
CO2: 26 mmol/L (ref 22–32)
CREATININE: 0.9 mg/dL (ref 0.44–1.00)
Glucose, Bld: 93 mg/dL (ref 65–99)
Potassium: 3.4 mmol/L — ABNORMAL LOW (ref 3.5–5.1)
SODIUM: 138 mmol/L (ref 135–145)

## 2016-12-08 LAB — ANA W/REFLEX IF POSITIVE: Anti Nuclear Antibody(ANA): NEGATIVE

## 2016-12-08 SURGERY — ECHOCARDIOGRAM, TRANSESOPHAGEAL
Anesthesia: Monitor Anesthesia Care

## 2016-12-08 MED ORDER — PROPOFOL 500 MG/50ML IV EMUL
INTRAVENOUS | Status: DC | PRN
  Administered 2016-12-08: 100 ug/kg/min via INTRAVENOUS

## 2016-12-08 MED ORDER — LISINOPRIL 20 MG PO TABS: 20.0000 mg | ORAL_TABLET | Freq: Every day | ORAL | 0 refills | Status: DC

## 2016-12-08 MED ORDER — BUTAMBEN-TETRACAINE-BENZOCAINE 2-2-14 % EX AERO
INHALATION_SPRAY | CUTANEOUS | Status: DC | PRN
  Administered 2016-12-08: 2 via TOPICAL

## 2016-12-08 MED ORDER — ATORVASTATIN CALCIUM 40 MG PO TABS: 40.0000 mg | ORAL_TABLET | Freq: Every day | ORAL | 0 refills | Status: DC

## 2016-12-08 MED ORDER — SODIUM CHLORIDE 0.9 % IV SOLN: INTRAVENOUS | Status: DC

## 2016-12-08 MED ORDER — PROPOFOL 10 MG/ML IV BOLUS
INTRAVENOUS | Status: DC | PRN
  Administered 2016-12-08 (×3): 40 mg via INTRAVENOUS

## 2016-12-08 MED ORDER — LIDOCAINE HCL (CARDIAC) 20 MG/ML IV SOLN
INTRAVENOUS | Status: DC | PRN
  Administered 2016-12-08: 80 mg via INTRAVENOUS

## 2016-12-08 MED ORDER — ASPIRIN 325 MG PO TABS: 325.0000 mg | ORAL_TABLET | Freq: Every day | ORAL | 0 refills | Status: DC

## 2016-12-08 MED FILL — LISINOPRIL 20 MG TABS: 20 | 30 days supply | Qty: 30 | Fill #0

## 2016-12-08 MED FILL — ATORVASTATIN 40 MG TABLET: 40 | 30 days supply | Qty: 30 | Fill #0

## 2016-12-08 NOTE — Progress Notes (Signed)
OT Cancellation Note  Patient Details Name: Brittany [name] MRN: [id number] DOB: [birth year]   Cancelled Treatment:    Reason Eval/Treat Not Completed: Patient at procedure or test/ unavailable. OT will continue to follow as schedule allows.  [place] 12/08/2016, 8:24 AM  Hulda Humphrey OTR/L [phone]

## 2016-12-08 NOTE — Interval H&P Note (Signed)
History and Physical Interval Note:  12/08/2016 8:01 AM  Brittany [name]  has presented today for surgery, with the diagnosis of stroke  The various methods of treatment have been discussed with the patient and family. After consideration of risks, benefits and other options for treatment, the patient has consented to  Procedure(s): TRANSESOPHAGEAL ECHOCARDIOGRAM (TEE) (N/A) as a surgical intervention .  The patient's history has been reviewed, patient examined, no change in status, stable for surgery.  I have reviewed the patient's chart and labs.  Questions were answered to the patient's satisfaction.     [name]

## 2016-12-08 NOTE — Progress Notes (Signed)
  Echocardiogram 2D Echocardiogram has been performed.  Merrie Roof F 12/08/2016, 9:50 AM

## 2016-12-08 NOTE — Anesthesia Preprocedure Evaluation (Addendum)
Anesthesia Evaluation  Patient identified by MRN, date of birth, ID band Patient awake    Reviewed: Allergy & Precautions, H&P , NPO status , Patient's Chart, lab work & pertinent test results  Airway Mallampati: II   Neck ROM: full    Dental  (+) Poor Dentition, Dental Advisory Given   Pulmonary former smoker,    breath sounds clear to auscultation       Cardiovascular hypertension,  Rhythm:regular Rate:Normal     Neuro/Psych  Headaches,  Neuromuscular disease CVA    GI/Hepatic hiatal hernia, IBS   Endo/Other    Renal/GU      Musculoskeletal   Abdominal   Peds  Hematology Multiple myeloma   Anesthesia Other Findings   Reproductive/Obstetrics                            Anesthesia Physical Anesthesia Plan  ASA: III  Anesthesia Plan: MAC   Post-op Pain Management:    Induction: Intravenous  PONV Risk Score and Plan: 2 and Ondansetron, Dexamethasone, Treatment may vary due to age or medical condition and Propofol infusion  Airway Management Planned: Nasal Cannula  Additional Equipment:   Intra-op Plan:   Post-operative Plan:   Informed Consent: I have reviewed the patients History and Physical, chart, labs and discussed the procedure including the risks, benefits and alternatives for the proposed anesthesia with the patient or authorized representative who has indicated his/her understanding and acceptance.     Plan Discussed with: CRNA, Anesthesiologist and Surgeon  Anesthesia Plan Comments:         Anesthesia Quick Evaluation

## 2016-12-08 NOTE — Progress Notes (Signed)
PT Cancellation Note  Patient Details Name: Brittany [name] MRN: [id number] DOB: 1965-06-22   Cancelled Treatment:    Reason Eval/Treat Not Completed: Patient at procedure or test/unavailable. PT will continue to f/u with pt as available.    Clearnce Sorrel Tomie Elko 12/08/2016, 8:23 AM

## 2016-12-08 NOTE — Care Management Note (Addendum)
Case Management Note Original Note Created Pollie Friar, RN 12/06/2016, 2:05 PM  Patient Details  Name: Brittany [name] MRN: [id number] Date of Birth: [birth year]  Subjective/Objective:   Pt admitted with CVA. She is from home with her parents.                  Action/Plan: PT recommending CIR. Awaiting OT eval. CM also consulted d/t patient not having insurance or PCP. CM inquired about her attending one of the [facility]. CM was able to obtain her an appointment at the [facility]. Information on the AVS.  CM also went over the use of the pharmacy at the [facility] while she is active with the clinic to assist with the cost of her medications.  If patient d/c late in the day or over the weekend she may need a [place] letter. CM following.  Additional CM follow up notes:  12/08/16- 1020- Yaretsi Humphres RN, CM- noted HH and DME orders placed- pt un-insured- will be charity care for [facility] services with [facility]- spoke with Jermaine with [facility] who is already aware of pt- and following for [facility] services PT/OT/SW- will also deliver- DME- RW and 3n1 to room  Prior to discharge. CM to continue to follow for any further needs.   Expected Discharge Date:  12/08/16               Expected Discharge Plan:  [place]  In-House Referral:     Discharge planning Services  CM Consult, [facility], Medication Assistance [place] Acute Care Choice:  Durable Medical Equipment, Home Health Choice offered to:  Patient  DME Arranged:  3-N-1, Walker rolling DME Agency:  [place] Arranged:  PT, OT, Social Work [employer] Agency:  [place]  Status of Service:  Completed, signed off  If discussed at [employer], dates discussed:    Discharge Disposition: home/home health   Additional Comments:  12/08/16- 1700- Marvetta Gibbons RN, CM- pt to d/c home today- call received from bedside RN- Nira Conn pt will need medication assistance-  scripts have been sent to [place] provided pt [place] letter $3 cost per script (total $9) pt states she can handle this cost.   Dahlia Client, Romeo Rabon, RN 12/08/2016, 10:26 AM [phone]

## 2016-12-08 NOTE — Discharge Summary (Signed)
Physician Discharge Summary  Brittany [name] HWE:[id number] DOB: 04/06/1965 DOA: 12/05/2016  [name]: Brittany [name] Per  Admit date: 12/05/2016 Discharge date: 12/08/2016  Time spent: 45 minutes  Recommendations for Outpatient Follow-up:  Brittany [name] will be discharged to home with home health physical and occupational therapy.  Brittany [name] will need to follow up with primary care provider within one week of discharge.  Follow up with Dr. Leonie Man, neurology, in 6 weeks. Brittany [name] should continue medications as prescribed.  Brittany [name] should follow a heart healthy diet.   Discharge Diagnoses:  Acute CVA Uncontrolled hypertension Marijuana abuse History multiple myeloma Chronic pain Hypokalemia Nausea  Discharge Condition: Stable  Diet recommendation: heart healthy  Filed Weights   12/05/16 0949 12/05/16 2143 12/08/16 0825  Weight: 72.6 kg (160 lb) 73.5 kg (162 lb 0.6 oz) 73.5 kg (162 lb)    History of present illness:  on 12/05/2016 by Dr. Candace Cruise Seversonis a 51 y.o.femalewith history of hypertension who has not been taking medications for the last 3 years due to financial issues presents to the ER at [facility] with complaints of persistent dizziness for the last 3 days. Brittany [name] also has been having headache mostly in the left side of the head with nausea and vomiting. Denies any visual symptoms or any weakness of the upper or lower extremities. Has been having tingling and numbness of the face and extremities.  Hospital Course:  Acute CVA -CT head: Suspect recent/acute infarct superior, medial right occipital lobe. Age uncertain infarct posterior mid left cerebellum -MRI brain: Acute left inferior cerebellar infarct, left PICA territory. -MRA: Unremarkable with regard to large vessel/vertebral occlusion; left PICA does not show flow related enhancement -Neurology consulted and appreciated -carotid doppler: No significant ICA stenosis. Antegrade vertebral  flow -Echocardiogram: EF 89-38%, grade 1 diastolic dysfunction -LDL 139, hemoglobin A1c 5.6 -PT/OT recommended CIR. CIR consulted and felt Brittany [name] did not inpatient rehab -PT now recommending home health -Discussed with Dr. Leonie Man, recommended Brittany [name] have TEE and loop recorder. Continue aspirin and statin. Follow up in 6 weeks. -S/p TEE which showed positive PFO -Lower extremity doppler: negative for DVT or baker's cyst -Brittany [name] is to follow up with [facility] for loop recorder once she obtains insurance  Uncontrolled hypertension -Allowing for permissive hypertension -Continue IV hydralazine -Brittany [name] does not know what medication she was on in the past. We'll start her on lisinopril  Marijuana abuse -UDS positive for THC -Counseled  History multiple myeloma -Treated in [place], states she is in remission  Chronic pain -Brittany [name] states she was on gabapentin and other medications but cannot remember the names or dosages at this time. Brittany [name] is not been on these medications for quite some time due to loss of insurance and having no refills. -Will place Brittany [name] on hydrocodone as needed -Case management consulted  Hypokalemia -Resolved, continue to monitor BMP  Nausea -Possibly secondary to stroke -Continue antiemetics as needed  Procedures: Echocardiogram Carotid doppler Lower extremity doppler TEE  Consultations: Neurology Cardiology  Discharge Exam: Vitals:   12/08/16 0940 12/08/16 1445  BP: (!) 156/95 (!) 175/101  Pulse: (!) 104 (!) 101  Resp: 20 18  Temp:  98.5 F (36.9 C)  SpO2: 100% 97%   Brittany [name] states she is feeling better today. Denies chest pain, shortness of breath, abdominal pain, N/V/D/C.    General: Well developed, well nourished, NAD, appears stated age  HEENT: NCAT, mucous membranes moist.  Cardiovascular: S1 S2 auscultated, no rubs, murmurs or gallops. Regular rate and rhythm.  Respiratory: Clear to auscultation bilaterally with  equal chest rise  Abdomen: Soft, nontender, nondistended, + bowel sounds  Extremities: warm dry without cyanosis clubbing or edema  Neuro: AAOx3, nonfocal  Psych: Appropriate mood and affect  Discharge Instructions Discharge Instructions    Ambulatory referral to Physical Medicine Rehab    Complete by:  As directed    1 month post-stroke hospital follow up   Discharge instructions    Complete by:  As directed    Brittany [name] will be discharged to home with home health physical and occupational therapy.  Brittany [name] will need to follow up with primary care provider within one week of discharge.  Follow up with Dr. Leonie Man, neurology, in 6 weeks. Brittany [name] should continue medications as prescribed.  Brittany [name] should follow a heart healthy diet.     Current Discharge Medication List    START taking these medications   Details  aspirin 325 MG tablet Take 1 tablet (325 mg total) by mouth daily. Qty: 30 tablet, Refills: 0    atorvastatin (LIPITOR) 40 MG tablet Take 1 tablet (40 mg total) by mouth daily at 6 PM. Qty: 30 tablet, Refills: 0    lisinopril (PRINIVIL,ZESTRIL) 20 MG tablet Take 1 tablet (20 mg total) by mouth daily. Qty: 30 tablet, Refills: 0      CONTINUE these medications which have NOT CHANGED   Details  docusate sodium (COLACE) 100 MG capsule Take 400 mg by mouth 2 (two) times daily.    Multiple Vitamin (MULTIVITAMIN) capsule Take 4 capsules by mouth 2 (two) times daily. Centrum plus       Allergies  Allergen Reactions  . Hydrocodone Nausea And Vomiting  . Lactose Intolerance (Gi)   . Other     States can't take pain meds that end in "cet" or meds that end in "ine" Darvocet  . Oxycodone Nausea And Vomiting   Follow-up Information    [facility] Follow up on 12/19/2016.   Why:  Your appointment time is 10:30. Please arrive 15 min early and bring a picture ID and your current medications.  Contact information: [facility] [place]  [phone]       [facility].   Why:  HHPT/OT/SW arranged- They will contact you for the first appointment. Contact information: [place] [address] [place] Follow up.   Why:  rolling walker and 3n1 arranged- to be delivered to room prior to discharge Contact information: [place] [address] [phone]        Garvin Fila, MD. Schedule an appointment as soon as possible for a visit in 1 week(s).   Specialties:  Neurology, Radiology Why:  Hospital follow up: Stroke clinic Contact information: [address] [place] [address] [phone]            The results of significant diagnostics from this hospitalization (including imaging, microbiology, ancillary and laboratory) are listed below for reference.    Significant Diagnostic Studies: Ct Head Wo Contrast  Result Date: 12/05/2016 CLINICAL DATA:  Vertigo. Hypertension. History of multiple myeloma. EXAM: CT HEAD WITHOUT CONTRAST TECHNIQUE: Contiguous axial images were obtained from the base of the skull through the vertex without intravenous contrast. COMPARISON:  None. FINDINGS: Brain: The ventricles are normal in size and configuration. There is no demonstrable intracranial mass, hemorrhage, extra-axial fluid collection, or midline shift. There  is decreased attenuation in the medial superior right occipital lobe. This appearance is concerning for recent infarct in this area. There is evidence of an age uncertain infarct in the posterior mid left cerebellum. Elsewhere gray-white compartments appear normal. Vascular: There is no appreciable hyperdense vessel. There is no appreciable vascular calcification. Skull: The bony calvarium appears intact. Sinuses/Orbits: There is mucosal thickening in several ethmoid air cells. Other visualized paranasal sinuses clear. Visualized orbits appear  symmetric bilaterally. Other: Mastoid air cells are clear. IMPRESSION: 1. Suspect recent/ acute infarct in the superior, medial right occipital lobe. This finding warrants MRI with diffusion imaging to further evaluate. 2.  Age uncertain infarct posterior mid left cerebellum. 3. Gray-white compartments elsewhere appear unremarkable. No mass, hemorrhage, or extra-axial fluid collection. 4.  Mucosal thickening in several ethmoid air cells. Electronically Signed   By: Lowella Grip III M.D.   On: 12/05/2016 10:36   Mr Brain Wo Contrast  Result Date: 12/06/2016 CLINICAL DATA:  History of hypertension, not taking medications, dizziness. History of myeloma. EXAM: MRI HEAD WITHOUT CONTRAST MRA HEAD WITHOUT CONTRAST TECHNIQUE: Multiplanar, multiecho pulse sequences of the brain and surrounding structures were obtained without intravenous contrast. Angiographic images of the head were obtained using MRA technique without contrast. COMPARISON:  CT head 12/05/2016. FINDINGS: MRI HEAD FINDINGS Brain: Multifocal areas of restricted diffusion are seen throughout the LEFT cerebellum, predominantly LEFT posterior inferior cerebellar artery territory, consistent with acute infarction. No brainstem involvement. No significant mass effect, visible hemorrhage, or other similar areas of acute ischemia. Normal cerebral volume.  No white matter disease. Vascular: Normal flow voids. Skull and upper cervical spine: Normal marrow signal. Sinuses/Orbits: Negative. Other: None. Compared with the CT from yesterday, the LEFT cerebellar infarct is difficult to visualize. The questioned area of hypoattenuation in the RIGHT occipital lobe is artifactual. MRA HEAD FINDINGS The internal carotid arteries are widely patent. Basilar artery is widely patent with vertebrals codominant. There is no proximal stenosis of the anterior, middle, or posterior cerebral arteries. There is patency of the BILATERAL superior cerebellar arteries and BILATERAL  anterior inferior cerebellar arteries. The RIGHT posterior inferior cerebellar artery is patent. The LEFT posterior inferior cerebellar artery does not demonstrate flow related enhancement. No saccular aneurysm. IMPRESSION: Acute LEFT inferior cerebellar infarct, LEFT PICA territory. No involvement of the brainstem or other vascular territories. No hemorrhage or mass effect. MRA unremarkable with regard to large vessel/vertebral occlusion; LEFT PICA does not show flow related enhancement. Electronically Signed   By: Staci Righter M.D.   On: 12/06/2016 07:16   Mr Jodene Nam Head Wo Contrast  Result Date: 12/06/2016 CLINICAL DATA:  History of hypertension, not taking medications, dizziness. History of myeloma. EXAM: MRI HEAD WITHOUT CONTRAST MRA HEAD WITHOUT CONTRAST TECHNIQUE: Multiplanar, multiecho pulse sequences of the brain and surrounding structures were obtained without intravenous contrast. Angiographic images of the head were obtained using MRA technique without contrast. COMPARISON:  CT head 12/05/2016. FINDINGS: MRI HEAD FINDINGS Brain: Multifocal areas of restricted diffusion are seen throughout the LEFT cerebellum, predominantly LEFT posterior inferior cerebellar artery territory, consistent with acute infarction. No brainstem involvement. No significant mass effect, visible hemorrhage, or other similar areas of acute ischemia. Normal cerebral volume.  No white matter disease. Vascular: Normal flow voids. Skull and upper cervical spine: Normal marrow signal. Sinuses/Orbits: Negative. Other: None. Compared with the CT from yesterday, the LEFT cerebellar infarct is difficult to visualize. The questioned area of hypoattenuation in the RIGHT occipital lobe is artifactual. MRA HEAD FINDINGS The internal carotid  arteries are widely patent. Basilar artery is widely patent with vertebrals codominant. There is no proximal stenosis of the anterior, middle, or posterior cerebral arteries. There is patency of the  BILATERAL superior cerebellar arteries and BILATERAL anterior inferior cerebellar arteries. The RIGHT posterior inferior cerebellar artery is patent. The LEFT posterior inferior cerebellar artery does not demonstrate flow related enhancement. No saccular aneurysm. IMPRESSION: Acute LEFT inferior cerebellar infarct, LEFT PICA territory. No involvement of the brainstem or other vascular territories. No hemorrhage or mass effect. MRA unremarkable with regard to large vessel/vertebral occlusion; LEFT PICA does not show flow related enhancement. Electronically Signed   By: Staci Righter M.D.   On: 12/06/2016 07:16    Microbiology: No results found for this or any previous visit (from the past 240 hour(s)).   Labs: Basic Metabolic Panel:  Recent Labs Lab 12/05/16 1014 12/06/16 0749 12/07/16 0355 12/08/16 0312  NA 138 138 138 138  K 3.5 3.4* 3.6 3.4*  CL 103 104 106 104  CO2 _0 GLUCOSE 101* 88 97 93  BUN _1 CREATININE 0.94 0.98 0.90 0.90  CALCIUM 10.1 9.3 9.4 9.3  MG  --   --  1.9  --    Liver Function Tests:  Recent Labs Lab 12/05/16 1014 12/06/16 0749  AST 34 26  ALT 18 15  ALKPHOS 99 85  BILITOT 1.1 0.6  PROT 8.7* 6.9  ALBUMIN 4.6 4.0    Recent Labs Lab 12/05/16 1014  LIPASE 54*   No results for input(s): AMMONIA in the last 168 hours. CBC:  Recent Labs Lab 12/05/16 1014 12/06/16 0749 12/07/16 0355  WBC 7.2 6.2 9.3  NEUTROABS 2.8  --   --   HGB 15.7* 14.4 14.4  HCT 45.0 43.6 43.7  MCV 82.1 85.5 84.9  PLT 190 180 190   Cardiac Enzymes:  Recent Labs Lab 12/05/16 1014 12/06/16 0749  TROPONINI <0.03 <0.03   BNP: BNP (last 3 results) No results for input(s): BNP in the last 8760 hours.  ProBNP (last 3 results) No results for input(s): PROBNP in the last 8760 hours.  CBG: No results for input(s): GLUCAP in the last 168 hours.     SignedCristal Ford  [facility] 12/08/2016, 4:41 PM

## 2016-12-08 NOTE — Consult Note (Signed)
ELECTROPHYSIOLOGY CONSULT NOTE  Patient ID: Brittany [name] MRN: [id number], DOB/AGE: 1965-08-27   Admit date: 12/05/2016 Date of Consult: 12/08/2016  Primary Physician: Patient, No Pcp Per Primary Cardiologist: new to [facility] Reason for Consultation: Cryptogenic stroke; recommendations regarding Implantable Loop Recorder  History of Present Illness EP has been asked to evaluate [name] for placement of an implantable loop recorder to monitor for atrial fibrillation by Dr Leonie Man.  The patient was admitted on 12/05/2016 with dizziness.   Imaging demonstrated acute left inferior cerebellar infarct felt to be embolic 2/2 unknown source.  She has undergone workup for stroke including echocardiogram and carotid dopplers.  The patient has been monitored on telemetry which has demonstrated sinus rhythm with no arrhythmias.  Inpatient stroke work-up is to be completed with a TEE.   Echocardiogram this admission demonstrated EF 65-70%, no RWMA, LA 34.  Lab work is reviewed.  Prior to admission, the patient denies chest pain, shortness of breath, palpitations, or syncope.     She recently moved to [place] from [place] to live with her parents. She does not work 2/2 neuropathy. She has not been on medications for several months 2/2 financial issues.   Past Medical History:  Diagnosis Date  . Hiatal hernia   . Hypertension   . IBS (irritable bowel syndrome)   . Multiple myeloma ([place])   . Peripheral neuropathy      Surgical History:  Past Surgical History:  Procedure Laterality Date  . BONE BIOPSY    . TUBAL LIGATION    . WISDOM TOOTH EXTRACTION       Prescriptions Prior to Admission  Medication Sig Dispense Refill Last Dose  . docusate sodium (COLACE) 100 MG capsule Take 400 mg by mouth 2 (two) times daily.   Past Week at Unknown time  . Multiple Vitamin (MULTIVITAMIN) capsule Take 4 capsules by mouth 2 (two) times daily. Centrum plus   Past Week at Unknown time    Inpatient  Medications:  . aspirin  300 mg Rectal Daily   Or  . aspirin  325 mg Oral Daily  . atorvastatin  40 mg Oral q1800  . lisinopril  20 mg Oral Daily    Allergies:  Allergies  Allergen Reactions  . Hydrocodone Nausea And Vomiting  . Lactose Intolerance (Gi)   . Other     States can't take pain meds that end in "cet" or meds that end in "ine" Darvocet  . Oxycodone Nausea And Vomiting    Social History   Social History  . Marital status: Legally Separated    Spouse name: N/A  . Number of children: N/A  . Years of education: N/A   Occupational History  . Not on file.   Social History Main Topics  . Smoking status: Former Smoker    Quit date: 12/06/2015  . Smokeless tobacco: Never Used  . Alcohol use No  . Drug use: No  . Sexual activity: Not on file   Other Topics Concern  . Not on file   Social History Narrative  . No narrative on file     Family History  Problem Relation Age of Onset  . Hypertension Mother   . Sarcoidosis Mother   . Hypertension Father   . Stroke Maternal Uncle       Review of Systems: All other systems reviewed and are otherwise negative except as noted above.  Physical Exam: Vitals:   12/07/16 1820 12/07/16 2047 12/08/16 0023 12/08/16 0601  BP: (!) 193/98 Marland Kitchen)  162/99 (!) 155/74 (!) 170/87  Pulse: 90 (!) 108 (!) 117 100  Resp: 17 18 18 18   Temp: 98.8 F (37.1 C) 98.9 F (37.2 C) 99.3 F (37.4 C) 98.9 F (37.2 C)  TempSrc: Oral Oral Oral Oral  SpO2: 100% 99% 100% 100%  Weight:      Height:        GEN- The patient is well appearing, alert and oriented x 3 today.   Head- normocephalic, atraumatic Eyes-  Sclera clear, conjunctiva pink Ears- hearing intact Oropharynx- clear Neck- supple Lungs- Clear to ausculation bilaterally, normal work of breathing Heart- Regular rate and rhythm, no murmurs, rubs or gallops  GI- soft, NT, ND, + BS Extremities- no clubbing, cyanosis, or edema MS- no significant deformity or atrophy Skin- no  rash or lesion Psych- euthymic mood, full affect   Labs:   Lab Results  Component Value Date   WBC 9.3 12/07/2016   HGB 14.4 12/07/2016   HCT 43.7 12/07/2016   MCV 84.9 12/07/2016   PLT 190 12/07/2016    Recent Labs Lab 12/06/16 0749  12/08/16 0312  NA 138  < > 138  K 3.4*  < > 3.4*  CL 104  < > 104  CO2 22  < > 26  BUN 10  < > 9  CREATININE 0.98  < > 0.90  CALCIUM 9.3  < > 9.3  PROT 6.9  --   --   BILITOT 0.6  --   --   ALKPHOS 85  --   --   ALT 15  --   --   AST 26  --   --   GLUCOSE 88  < > 93  < > = values in this interval not displayed.   Radiology/Studies: Ct Head Wo Contrast  Result Date: 12/05/2016 CLINICAL DATA:  Vertigo. Hypertension. History of multiple myeloma. EXAM: CT HEAD WITHOUT CONTRAST TECHNIQUE: Contiguous axial images were obtained from the base of the skull through the vertex without intravenous contrast. COMPARISON:  None. FINDINGS: Brain: The ventricles are normal in size and configuration. There is no demonstrable intracranial mass, hemorrhage, extra-axial fluid collection, or midline shift. There is decreased attenuation in the medial superior right occipital lobe. This appearance is concerning for recent infarct in this area. There is evidence of an age uncertain infarct in the posterior mid left cerebellum. Elsewhere gray-white compartments appear normal. Vascular: There is no appreciable hyperdense vessel. There is no appreciable vascular calcification. Skull: The bony calvarium appears intact. Sinuses/Orbits: There is mucosal thickening in several ethmoid air cells. Other visualized paranasal sinuses clear. Visualized orbits appear symmetric bilaterally. Other: Mastoid air cells are clear. IMPRESSION: 1. Suspect recent/ acute infarct in the superior, medial right occipital lobe. This finding warrants MRI with diffusion imaging to further evaluate. 2.  Age uncertain infarct posterior mid left cerebellum. 3. Gray-white compartments elsewhere appear  unremarkable. No mass, hemorrhage, or extra-axial fluid collection. 4.  Mucosal thickening in several ethmoid air cells. Electronically Signed   By: Lowella Grip III M.D.   On: 12/05/2016 10:36   Mr Brain Wo Contrast  Result Date: 12/06/2016 CLINICAL DATA:  History of hypertension, not taking medications, dizziness. History of myeloma. EXAM: MRI HEAD WITHOUT CONTRAST MRA HEAD WITHOUT CONTRAST TECHNIQUE: Multiplanar, multiecho pulse sequences of the brain and surrounding structures were obtained without intravenous contrast. Angiographic images of the head were obtained using MRA technique without contrast. COMPARISON:  CT head 12/05/2016. FINDINGS: MRI HEAD FINDINGS Brain: Multifocal areas of restricted diffusion  are seen throughout the LEFT cerebellum, predominantly LEFT posterior inferior cerebellar artery territory, consistent with acute infarction. No brainstem involvement. No significant mass effect, visible hemorrhage, or other similar areas of acute ischemia. Normal cerebral volume.  No white matter disease. Vascular: Normal flow voids. Skull and upper cervical spine: Normal marrow signal. Sinuses/Orbits: Negative. Other: None. Compared with the CT from yesterday, the LEFT cerebellar infarct is difficult to visualize. The questioned area of hypoattenuation in the RIGHT occipital lobe is artifactual. MRA HEAD FINDINGS The internal carotid arteries are widely patent. Basilar artery is widely patent with vertebrals codominant. There is no proximal stenosis of the anterior, middle, or posterior cerebral arteries. There is patency of the BILATERAL superior cerebellar arteries and BILATERAL anterior inferior cerebellar arteries. The RIGHT posterior inferior cerebellar artery is patent. The LEFT posterior inferior cerebellar artery does not demonstrate flow related enhancement. No saccular aneurysm. IMPRESSION: Acute LEFT inferior cerebellar infarct, LEFT PICA territory. No involvement of the brainstem or  other vascular territories. No hemorrhage or mass effect. MRA unremarkable with regard to large vessel/vertebral occlusion; LEFT PICA does not show flow related enhancement. Electronically Signed   By: Staci Righter M.D.   On: 12/06/2016 07:16   Mr Jodene Nam Head Wo Contrast  Result Date: 12/06/2016 CLINICAL DATA:  History of hypertension, not taking medications, dizziness. History of myeloma. EXAM: MRI HEAD WITHOUT CONTRAST MRA HEAD WITHOUT CONTRAST TECHNIQUE: Multiplanar, multiecho pulse sequences of the brain and surrounding structures were obtained without intravenous contrast. Angiographic images of the head were obtained using MRA technique without contrast. COMPARISON:  CT head 12/05/2016. FINDINGS: MRI HEAD FINDINGS Brain: Multifocal areas of restricted diffusion are seen throughout the LEFT cerebellum, predominantly LEFT posterior inferior cerebellar artery territory, consistent with acute infarction. No brainstem involvement. No significant mass effect, visible hemorrhage, or other similar areas of acute ischemia. Normal cerebral volume.  No white matter disease. Vascular: Normal flow voids. Skull and upper cervical spine: Normal marrow signal. Sinuses/Orbits: Negative. Other: None. Compared with the CT from yesterday, the LEFT cerebellar infarct is difficult to visualize. The questioned area of hypoattenuation in the RIGHT occipital lobe is artifactual. MRA HEAD FINDINGS The internal carotid arteries are widely patent. Basilar artery is widely patent with vertebrals codominant. There is no proximal stenosis of the anterior, middle, or posterior cerebral arteries. There is patency of the BILATERAL superior cerebellar arteries and BILATERAL anterior inferior cerebellar arteries. The RIGHT posterior inferior cerebellar artery is patent. The LEFT posterior inferior cerebellar artery does not demonstrate flow related enhancement. No saccular aneurysm. IMPRESSION: Acute LEFT inferior cerebellar infarct, LEFT PICA  territory. No involvement of the brainstem or other vascular territories. No hemorrhage or mass effect. MRA unremarkable with regard to large vessel/vertebral occlusion; LEFT PICA does not show flow related enhancement. Electronically Signed   By: Staci Righter M.D.   On: 12/06/2016 07:16    12-lead ECG sinus rhythm (personally reviewed) All prior EKG's in EPIC reviewed with no documented atrial fibrillation  Telemetry sinus rhythm, ST (personally reviewed)  Assessment and Plan:  1. Cryptogenic stroke The patient presents with cryptogenic stroke.  The patient has a TEE planned for this AM. EP has been requested to consider ILR for evaluation of AF.  She currently does not have insurance. We discussed today monthly costs associated with ILR monitoring. She is clear that she can not afford and does not want to proceed at this time. When she obtains insurance, we are happy to discuss with her further.  Please call with questions.  Chanetta Marshall, NP 12/08/2016 7:21 AM  I have seen and examined this patient with Chanetta Marshall.  Agree with above, note added to reflect my findings.  On exam, RRR, no murmurs, lungs clear. Presented to the hospital with cryptogenic stroke. TEE planned for later today. Would qualify for LINQ implant, but that patient does not have insurance at the moment. Due to that, have told her to call the office when she has insurance and we would discuss LINQ further as an outpatient.    Hartley Wyke M. Nakira Litzau MD 12/08/2016 11:14 AM

## 2016-12-08 NOTE — Progress Notes (Signed)
*  PRELIMINARY RESULTS* Vascular Ultrasound Bilateral lower extremity venous duplex has been completed.  Preliminary findings: No evidence of deep vein thrombosis or baker's cysts bilaterally.   Everrett Coombe 12/08/2016, 3:50 PM

## 2016-12-08 NOTE — Progress Notes (Signed)
Occupational Therapy Treatment Patient Details Name: Brittany [name] MRN: [id number] DOB: 1966/03/19 Today's Date: 12/08/2016    History of present illness 51 y.o. female with history of hypertension who has not been taking medications for the last 3 years due to financial issues presented to the ER at [place] [place] with complaints of persistent dizziness for 3 days. Patient also has been having headache mostly in the left side of the head with nausea and vomiting. 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 PMH significant for HTN; multiple myeloma and PN.    OT comments  Pt progressing towards goals. Pt reports that dizziness is better than yesterday. Pt tearful about food tray. See general comments below. Pt educated on Psychiatrist and continued education for home set up for dizziness and to increase safety. Pt able to verbalize and practice gaze stabilization during stand pivot to recliner, OT will continue to follow in the acute setting.   Follow Up Recommendations  Home health OT;Supervision - Intermittent    Equipment Recommendations  3 in 1 bedside commode    Recommendations for Other Services      Precautions / Restrictions Precautions Precautions: Fall Restrictions Weight Bearing Restrictions: No       Mobility Bed Mobility Overal bed mobility: Modified Independent Bed Mobility: Supine to Sit     Supine to sit: Modified independent (Device/Increase time)     General bed mobility comments: use of bed rail  Transfers Overall transfer level: Needs assistance Equipment used: 1 person hand held assist Transfers: Stand Pivot Transfers   Stand pivot transfers: Min guard       General transfer comment: increased time, cautious secondary to dizziness, min guard for stability with transition    Balance Overall balance assessment: Needs assistance Sitting-balance support: Feet supported Sitting balance-Leahy Scale: Good     Standing balance support:  No upper extremity supported;During functional activity Standing balance-Leahy Scale: Fair                             ADL either performed or assessed with clinical judgement   ADL Overall ADL's : Needs assistance/impaired                     Lower Body Dressing: Supervision/safety;With adaptive equipment;Sit to/from stand Lower Body Dressing Details (indicate cue type and reason): edcuated and practiced with grabber/reacher for LB dressing Toilet Transfer: Comfort height toilet;Ambulation;Min guard Armed forces technical officer Details (indicate cue type and reason): simulated through recliner transfer         Functional mobility during ADLs: Min guard General ADL Comments: Continued/reinforced education on home set up to reduce dizziness and increase safety in addition to education on Archivist     Praxis      Cognition Arousal/Alertness: Awake/alert Behavior During Therapy: Anxious (crying) Overall Cognitive Status: No family/caregiver present to determine baseline cognitive functioning                                 General Comments: continues to be labile        Exercises     Shoulder Instructions       General Comments Pt tearful throughout session about food tray. This OT personally heard the RN call on 3 separate occations to check on the status of the Pt's tray and  gave them specific request that this patient DOES NOT EAT MEAT. Food tray showed up with fish. Talked with nutrition person who came with tray that all the Pt wants is a fruit tray. Pt tearful and nutrition said they would bring the fruit tray right away.    Pertinent Vitals/ Pain       Pain Assessment: No/denies pain  Home Living                                          Prior Functioning/Environment              Frequency  Min 3X/week        Progress Toward Goals  OT Goals(current goals can now be found in  the care plan section)  Progress towards OT goals: Progressing toward goals  Acute Rehab OT Goals Patient Stated Goal: return to PLOF OT Goal Formulation: With patient Time For Goal Achievement: 12/20/16 Potential to Achieve Goals: Good  Plan Discharge plan remains appropriate;Frequency remains appropriate    Co-evaluation                 AM-PAC PT "6 Clicks" Daily Activity     Outcome Measure   Help from another person eating meals?: None Help from another person taking care of personal grooming?: None Help from another person toileting, which includes using toliet, bedpan, or urinal?: A Little Help from another person bathing (including washing, rinsing, drying)?: None Help from another person to put on and taking off regular upper body clothing?: None Help from another person to put on and taking off regular lower body clothing?: None 6 Click Score: 23    End of Session Equipment Utilized During Treatment: Gait belt  OT Visit Diagnosis: Unsteadiness on feet (R26.81);Muscle weakness (generalized) (M62.81)   Activity Tolerance Patient tolerated treatment well   Patient Left in chair;with call bell/phone within reach   Nurse Communication Mobility status (food tray update)        Time: 6160-7371 OT Time Calculation (min): 20 min  Charges: OT General Charges $OT Visit: 1 Visit OT Treatments $Self Care/Home Management : 8-22 mins  Hulda Humphrey OTR/L [place] 12/08/2016, 2:19 PM

## 2016-12-08 NOTE — Transfer of Care (Signed)
Immediate Anesthesia Transfer of Care Note  Patient: Brittany [name]  Procedure(s) Performed: Procedure(s): TRANSESOPHAGEAL ECHOCARDIOGRAM (TEE) (N/A)  Patient Location: Endoscopy Unit  Anesthesia Type:General  Level of Consciousness: awake, alert  and oriented  Airway & Oxygen Therapy: Patient Spontanous Breathing and Patient connected to nasal cannula oxygen  Post-op Assessment: Report given to RN  Post vital signs: Reviewed and stable  Last Vitals:  Vitals:   12/08/16 0932 12/08/16 0940  BP: (!) 145/94 (!) 156/95  Pulse: (!) 104 (!) 104  Resp: 19 20  Temp:    SpO2: 100% 100%    Last Pain:  Vitals:   12/08/16 0825  TempSrc: Oral  PainSc:       Patients Stated Pain Goal: 6 (44/96/75 [id number])  Complications: No apparent anesthesia complications

## 2016-12-08 NOTE — Progress Notes (Signed)
STROKE TEAM PROGRESS NOTE   HISTORY OF PRESENT ILLNESS (per record) Brittany [name] is an 51 y.o. female with history of hypertension, multiple myeloma that has been treated, irritable bowel, peripheral neuropathy, who has not been taking medications for the last 3 years due to financial issues presents to the ER at [place]., [place] with complaints of persistent dizziness for the last 3 days. Patient also has been having headache mostly in the left side of the head with nausea and vomiting.   Currently having nausea and feeling as if the room is spinning right to left. Feels very weak due to chronic emesis.   Date last known well: Date: 12/03/2016 Time last known well: Unable to determine Modified Rankin: Rankin Score=0   Patient was not administered IV t-PA secondary to arriving outside of the tPA treatment window. She was admitted to General Neurology for further evaluation and treatment.   SUBJECTIVE (INTERVAL HISTORY) Patient just returned back from the. It showed a patent foramen ovale but no clot or vegetation. She has no neurological complaints.  OBJECTIVE Temp:  [98.3 F (36.8 C)-99.3 F (37.4 C)] 98.5 F (36.9 C) (09/21 0825) Pulse Rate:  [86-117] 104 (09/21 0940) Cardiac Rhythm: Normal sinus rhythm (09/21 0700) Resp:  [13-20] 20 (09/21 0940) BP: (145-193)/(74-105) 156/95 (09/21 0940) SpO2:  [99 %-100 %] 100 % (09/21 0940) Weight:  [162 lb (73.5 kg)] 162 lb (73.5 kg) (09/21 0825)  CBC:   Recent Labs Lab 12/05/16 1014 12/06/16 0749 12/07/16 0355  WBC 7.2 6.2 9.3  NEUTROABS 2.8  --   --   HGB 15.7* 14.4 14.4  HCT 45.0 43.6 43.7  MCV 82.1 85.5 84.9  PLT 190 180 161    Basic Metabolic Panel:   Recent Labs Lab 12/07/16 0355 12/08/16 0312  NA 138 138  K 3.6 3.4*  CL 106 104  CO2 26 26  GLUCOSE 97 93  BUN 10 9  CREATININE 0.90 0.90  CALCIUM 9.4 9.3  MG 1.9  --     Lipid Panel:     Component Value Date/Time   CHOL 196 12/06/2016 0749   TRIG 96  12/06/2016 0749   HDL 38 (L) 12/06/2016 0749   CHOLHDL 5.2 12/06/2016 0749   VLDL 19 12/06/2016 0749   LDLCALC 139 (H) 12/06/2016 0749   HgbA1c:  Lab Results  Component Value Date   HGBA1C 5.6 12/06/2016   Urine Drug Screen:     Component Value Date/Time   LABOPIA NONE DETECTED 12/05/2016 1640   COCAINSCRNUR NONE DETECTED 12/05/2016 1640   LABBENZ NONE DETECTED 12/05/2016 1640   AMPHETMU NONE DETECTED 12/05/2016 1640   THCU POSITIVE (A) 12/05/2016 1640   LABBARB NONE DETECTED 12/05/2016 1640    Alcohol Level     Component Value Date/Time   ETH <5 12/05/2016 1014    IMAGING  Mr Brain Wo Contrast 12/06/2016 IMPRESSION: Acute LEFT inferior cerebellar infarct, LEFT PICA territory. No involvement of the brainstem or other vascular territories. No hemorrhage or mass effect.   Mr Virgel Paling Wo Contrast 12/06/2016 MRA unremarkable with regard to large vessel/vertebral occlusion; LEFT PICA does not show flow related enhancement.    Carotid US 12/06/2016 Summary: - The vertebral arteries appear patent with antegrade flow. - Findings consistent with 1-39 percent stenosis involving the right internal carotid artery and the left internal carotid artery.  TTE 12/06/2016 Study Conclusions - Left ventricle: The cavity size was normal. There was severe focal basal and mild concentric hypertrophy. Systolic function was  vigorous. The estimated ejection fraction was in the range of 65% to 70%. Wall motion was normal; there were no regional wall   motion abnormalities. There was an increased relative contribution of atrial contraction to ventricular filling.  Doppler parameters are consistent with abnormal left ventricular relaxation (grade 1 diastolic dysfunction). - Aortic valve: Valve area (Vmax): 1.75 cm^2. - Mitral valve: There was trivial regurgitation. Valve area by pressure half-time: 1.55 cm^2. - Atrial septum: There was increased thickness of the septum, consistent with lipomatous  hypertrophy. - Pulmonary arteries: Systolic pressure could not be accurately estimated.  TEE 12/08/2016 No clot or vegetation. Positive PFO with right-to-left shunting by color flow Doppler   PHYSICAL EXAM Middle aged african american lady not in distress. . Afebrile. Head is nontraumatic. Neck is supple without bruit.    Cardiac exam no murmur or gallop. Lungs are clear to auscultation. Distal pulses are well felt. Neurological Exam ;  Awake  Alert oriented x 3. Normal speech and language.eye movements full without nystagmus.fundi were not visualized. Vision acuity and fields appear normal. Hearing is normal. Palatal movements are normal. Face symmetric. Tongue midline. Normal strength, tone, reflexes and coordination. Subjective diminished touch/pinprick  Sensation. From thigh down bilaterally Gait deferred.  ASSESSMENT/PLAN [name] is a 51 y.o. female with history of hypertension, myeloma that has been treated, irritable bowel, peripheral neuropathy, who has not been taking medications for the last 3 years due to financial issues presents to the ER at [place]., [place] with complaints of persistent dizziness for the last 3 days. Patient also has been having headache mostly in the left side of the head with nausea and vomiting. She did not receive IV t-PA due to arriving outside of the tPA treatment window.    Stroke: Acute LEFT inferior cerebellar infarct, LEFT PICA territory, likely embolic, of undetermined etiology.   Resultant  Subjective dizziness  CT head: not performed  MRI head: Acute LEFT inferior cerebellar infarct, LEFT PICA territory.  MRA head: No LVo or high-grade stenosis  Carotid Doppler : B ICA 1-39% stenosis, VAs antegrade  2D Echo: EF 65-70%. No source of embolus   LDL 139  HgbA1c 5.6  SCDs for VTE prophylaxis Diet Heart Room service appropriate? Yes; Fluid consistency: Thin  No antithrombotic prior to admission, now on aspirin 325 mg  daily  Patient counseled to be compliant with her antithrombotic medications  Ongoing aggressive stroke risk factor management  Therapy recommendations: CIR  Disposition: pending  Hypertension  Stable  Permissive hypertension (OK if < 220/120) but gradually normalize in 5-7 days  Long-term BP goal normotensive  Hyperlipidemia  Home meds: none  LDL 139, goal < 70  Add atorvastatin 40 mg PO daily  Continue statin at discharge  Other Stroke Risk Factors  Obesity, Body mass index is 30.61 kg/m., recommend weight loss, diet and exercise as appropriate   Family hx stroke (uncle)  History of cancer (multiple myeloma)  Other Active Problems  UDS positive for [facility] day # 3  I have personally examined this patient, reviewed notes, independently viewed imaging studies, participated in medical decision making and plan of care.ROS completed by me personally and pertinent positives fully documented  I have made any additions or clarifications directly to the above note.  She presented with several days of dizziness secondary to embolic left posterior cerebellar infarct. Continue aspirin aspirin, statin and check lower extremity venous Doppler and if negative for DVT recommend loop recorder for prolonged cardiac monitoring for  A. Fib. Long discussion with patient and  Dr. Ree Kida and answered questions. Patient was counseled to quit smoking marijuana and cigarettes. Greater than 50% time during this 25 minute visit was spent on counseling and coordination of care about her embolic stroke, evaluation and treatment plan discussion. Follow-up as an outpatient in stroke clinic in 6 weeks. Stroke team will sign off. Kindly call for questions. Antony Contras, MD Medical Director [facility] Pager: [phone] 12/08/2016 12:05 PM   To contact Stroke Continuity provider, please refer to [email or web address] After hours, contact General Neurology

## 2016-12-08 NOTE — Anesthesia Postprocedure Evaluation (Signed)
Anesthesia Post Note  Patient: Brittany [name]  Procedure(s) Performed: Procedure(s) (LRB): TRANSESOPHAGEAL ECHOCARDIOGRAM (TEE) (N/A)     Patient location during evaluation: PACU Anesthesia Type: MAC Level of consciousness: awake and alert Pain management: pain level controlled Vital Signs Assessment: post-procedure vital signs reviewed and stable Respiratory status: spontaneous breathing, nonlabored ventilation, respiratory function stable and patient connected to nasal cannula oxygen Cardiovascular status: stable and blood pressure returned to baseline Postop Assessment: no apparent nausea or vomiting Anesthetic complications: no    Last Vitals:  Vitals:   12/08/16 0932 12/08/16 0940  BP: (!) 145/94 (!) 156/95  Pulse: (!) 104 (!) 104  Resp: 19 20  Temp:    SpO2: 100% 100%    Last Pain:  Vitals:   12/08/16 0825  TempSrc: Oral  PainSc:                  [place] S

## 2016-12-08 NOTE — Discharge Instructions (Signed)
Please call [facility] at [phone] when you obtain insurance if you would like to proceed with implantable loop recorder to monitor for atrial fibrillation.

## 2016-12-08 NOTE — CV Procedure (Signed)
    PROCEDURE NOTE:  Procedure:  Transesophageal echocardiogram Operator:  [name], MD Indications:  CVA Complications: None  During this procedure the patient is administered a total of Propofol 200 mg to achieve and maintain moderate conscious sedation.  The patient's heart rate, blood pressure, and oxygen saturation are monitored continuously during the procedure by anesthesia.   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 Normal thoracic and ascending aorta.  Positive study for PFO with right to left shunting by colorflow doppler The patient tolerated the procedure well and was transferred back to their room in stable condition.  Signed: [name], MD [facility]

## 2016-12-10 ENCOUNTER — Encounter (HOSPITAL_COMMUNITY): Payer: Self-pay | Admitting: Cardiology

## 2016-12-11 ENCOUNTER — Telehealth: Payer: Self-pay | Admitting: Licensed Clinical Social Worker

## 2016-12-11 NOTE — Telephone Encounter (Signed)
LCSWA received an incoming call from pt's mother, Riki Sheer [phone].   Ms. Mikael Spray had questions on how to apply for Medicaid for pt. LCSWA explained the application process and provided social services contact information.   Caller was transferred to [employer] to schedule pt an appointment with Financial Counselor to apply for CAFA and/or orange card program.

## 2016-12-12 ENCOUNTER — Telehealth: Payer: Self-pay

## 2016-12-12 NOTE — Telephone Encounter (Signed)
Brittany [name] PT [facility] called, requesting  Verbal orders for 2xwk X 4wks then 1xwk X 2wks for strength, gait, and balance training, called her back and approved verbal orders

## 2016-12-18 ENCOUNTER — Emergency Department (HOSPITAL_COMMUNITY): Payer: Medicaid Other

## 2016-12-18 ENCOUNTER — Encounter (HOSPITAL_COMMUNITY): Payer: Self-pay | Admitting: *Deleted

## 2016-12-18 ENCOUNTER — Inpatient Hospital Stay (HOSPITAL_COMMUNITY)
Admission: EM | Admit: 2016-12-18 | Discharge: 2016-12-21 | DRG: 313 | Disposition: A | Discharge status: Home Health | Payer: Medicaid Other | Attending: Internal Medicine | Admitting: Internal Medicine

## 2016-12-18 ENCOUNTER — Other Ambulatory Visit: Payer: Self-pay

## 2016-12-18 DIAGNOSIS — Z7982 Long term (current) use of aspirin: Secondary | ICD-10-CM

## 2016-12-18 DIAGNOSIS — Z590 Homelessness: Secondary | ICD-10-CM

## 2016-12-18 DIAGNOSIS — Z888 Allergy status to other drugs, medicaments and biological substances status: Secondary | ICD-10-CM

## 2016-12-18 DIAGNOSIS — Z91040 Latex allergy status: Secondary | ICD-10-CM

## 2016-12-18 DIAGNOSIS — Z885 Allergy status to narcotic agent status: Secondary | ICD-10-CM

## 2016-12-18 DIAGNOSIS — J029 Acute pharyngitis, unspecified: Secondary | ICD-10-CM | POA: Diagnosis not present

## 2016-12-18 DIAGNOSIS — I69354 Hemiplegia and hemiparesis following cerebral infarction affecting left non-dominant side: Secondary | ICD-10-CM

## 2016-12-18 DIAGNOSIS — C9000 Multiple myeloma not having achieved remission: Secondary | ICD-10-CM | POA: Diagnosis present

## 2016-12-18 DIAGNOSIS — Z6829 Body mass index (BMI) 29.0-29.9, adult: Secondary | ICD-10-CM

## 2016-12-18 DIAGNOSIS — Y92009 Unspecified place in unspecified non-institutional (private) residence as the place of occurrence of the external cause: Secondary | ICD-10-CM

## 2016-12-18 DIAGNOSIS — E669 Obesity, unspecified: Secondary | ICD-10-CM | POA: Diagnosis present

## 2016-12-18 DIAGNOSIS — E7800 Pure hypercholesterolemia, unspecified: Secondary | ICD-10-CM | POA: Diagnosis present

## 2016-12-18 DIAGNOSIS — K589 Irritable bowel syndrome without diarrhea: Secondary | ICD-10-CM | POA: Diagnosis present

## 2016-12-18 DIAGNOSIS — Z79899 Other long term (current) drug therapy: Secondary | ICD-10-CM

## 2016-12-18 DIAGNOSIS — R0789 Other chest pain: Principal | ICD-10-CM | POA: Diagnosis present

## 2016-12-18 DIAGNOSIS — I10 Essential (primary) hypertension: Secondary | ICD-10-CM | POA: Diagnosis present

## 2016-12-18 DIAGNOSIS — Q211 Atrial septal defect: Secondary | ICD-10-CM

## 2016-12-18 DIAGNOSIS — Z87891 Personal history of nicotine dependence: Secondary | ICD-10-CM

## 2016-12-18 DIAGNOSIS — R079 Chest pain, unspecified: Secondary | ICD-10-CM | POA: Diagnosis present

## 2016-12-18 DIAGNOSIS — G629 Polyneuropathy, unspecified: Secondary | ICD-10-CM | POA: Diagnosis present

## 2016-12-18 DIAGNOSIS — Y939 Activity, unspecified: Secondary | ICD-10-CM

## 2016-12-18 DIAGNOSIS — M79602 Pain in left arm: Secondary | ICD-10-CM | POA: Diagnosis present

## 2016-12-18 DIAGNOSIS — E538 Deficiency of other specified B group vitamins: Secondary | ICD-10-CM

## 2016-12-18 DIAGNOSIS — I639 Cerebral infarction, unspecified: Secondary | ICD-10-CM | POA: Diagnosis present

## 2016-12-18 HISTORY — DX: Cerebral infarction, unspecified: I63.9

## 2016-12-18 LAB — BASIC METABOLIC PANEL
Anion gap: 9 (ref 5–15)
BUN: 5 mg/dL — AB (ref 6–20)
CALCIUM: 9.4 mg/dL (ref 8.9–10.3)
CO2: 24 mmol/L (ref 22–32)
Chloride: 108 mmol/L (ref 101–111)
Creatinine, Ser: 0.65 mg/dL (ref 0.44–1.00)
GFR calc Af Amer: >60 mL/min (ref >60)
GFR calc non Af Amer: >60 mL/min (ref >60)
GLUCOSE: 99 mg/dL (ref 65–99)
Potassium: 3.8 mmol/L (ref 3.5–5.1)
Sodium: 141 mmol/L (ref 135–145)

## 2016-12-18 LAB — CBC
HEMATOCRIT: 40.5 % (ref 36.0–46.0)
HEMOGLOBIN: 13.5 g/dL (ref 12.0–15.0)
MCH: 28.4 pg (ref 26.0–34.0)
MCHC: 33.3 g/dL (ref 30.0–36.0)
MCV: 85.1 fL (ref 78.0–100.0)
Platelets: 212 10*3/uL (ref 150–400)
RBC: 4.76 MIL/uL (ref 3.87–5.11)
RDW: 13.5 % (ref 11.5–15.5)
WBC: 7.1 10*3/uL (ref 4.0–10.5)

## 2016-12-18 LAB — I-STAT TROPONIN, ED
TROPONIN I, POC: 0.03 ng/mL (ref 0.00–0.08)
Troponin i, poc: 0.04 ng/mL (ref 0.00–0.08)

## 2016-12-18 MED ORDER — DOCUSATE SODIUM 100 MG PO CAPS
200.0000 mg | ORAL_CAPSULE | Freq: Two times a day (BID) | ORAL | Status: DC
  Administered 2016-12-19 – 2016-12-21 (×6): 200 mg via ORAL
  Filled 2016-12-18 (×7): qty 2

## 2016-12-18 MED ORDER — ACETAMINOPHEN 325 MG PO TABS
650.0000 mg | ORAL_TABLET | ORAL | Status: DC | PRN
  Administered 2016-12-19: 650 mg via ORAL
  Filled 2016-12-18: qty 2

## 2016-12-18 MED ORDER — ENOXAPARIN SODIUM 40 MG/0.4ML ~~LOC~~ SOLN
40.0000 mg | Freq: Every day | SUBCUTANEOUS | Status: DC
  Administered 2016-12-19 – 2016-12-21 (×3): 40 mg via SUBCUTANEOUS
  Filled 2016-12-18 (×4): qty 0.4

## 2016-12-18 MED ORDER — ASPIRIN 325 MG PO TABS
325.0000 mg | ORAL_TABLET | Freq: Every day | ORAL | Status: DC
  Administered 2016-12-19 – 2016-12-21 (×3): 325 mg via ORAL
  Filled 2016-12-18 (×3): qty 1

## 2016-12-18 MED ORDER — ATORVASTATIN CALCIUM 40 MG PO TABS
40.0000 mg | ORAL_TABLET | Freq: Every day | ORAL | Status: DC
  Administered 2016-12-19 – 2016-12-20 (×2): 40 mg via ORAL
  Filled 2016-12-18 (×3): qty 1

## 2016-12-18 MED ORDER — LISINOPRIL 40 MG PO TABS
40.0000 mg | ORAL_TABLET | Freq: Every day | ORAL | Status: DC
  Administered 2016-12-19 – 2016-12-21 (×3): 40 mg via ORAL
  Filled 2016-12-18 (×2): qty 1
  Filled 2016-12-18: qty 2

## 2016-12-18 MED ORDER — SENNA 8.6 MG PO TABS
2.0000 | ORAL_TABLET | Freq: Two times a day (BID) | ORAL | Status: DC
  Administered 2016-12-19 – 2016-12-21 (×5): 17.2 mg via ORAL
  Filled 2016-12-18 (×7): qty 2

## 2016-12-18 MED ORDER — ACETAMINOPHEN 500 MG PO TABS
1000.0000 mg | ORAL_TABLET | Freq: Once | ORAL | Status: DC
  Filled 2016-12-18: qty 2

## 2016-12-18 MED ORDER — ONDANSETRON HCL 4 MG/2ML IJ SOLN
4.0000 mg | Freq: Four times a day (QID) | INTRAMUSCULAR | Status: DC | PRN
  Administered 2016-12-20 (×2): 4 mg via INTRAVENOUS
  Filled 2016-12-18 (×2): qty 2

## 2016-12-18 MED ORDER — NITROGLYCERIN IN D5W 200-5 MCG/ML-% IV SOLN
0.0000 ug/min | Freq: Once | INTRAVENOUS | Status: AC
  Administered 2016-12-18: 5 ug/min via INTRAVENOUS
  Filled 2016-12-18: qty 250

## 2016-12-18 NOTE — ED Notes (Signed)
Pt aware of UA needed 
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
## 2016-12-18 NOTE — Patient Outreach (Signed)
[place] [facility]) Care Management  12/18/2016  Brittany [name] Jul 18, 1965 [id number]   EMMI: stroke Referral date: 12/18/16 Referral source: EMMI stroke red alert Referral reason: Feeling worse overall: YES,  New or worsening pain/ fever/ shortness of breath: YES, Questions / problems with meds: YES Day # 6 Attempt #1  Telephone call to patient regarding EMMI stroke. Unable to reach. HIPAA compliant voice message left with call back phone number.   PLAN: RNCM will attempt 2nd telephone call to patient within 3 business days.   Quinn Plowman RN,BSN,CCM [facility] Telephonic  [phone]

## 2016-12-18 NOTE — ED Triage Notes (Addendum)
Pt here via [facility] from home.  Was receiving PT at home for L sided deficits from stroke 2 weeks ago and began experiencing chest pain.  Pt states woke up with R arm pain that increased to 8/10.  Pain decreased to 6/10 with 1 sl nitro (pt had taken 325 of her own asa). ekg showed nsr.  Denies nausea emesis, though states sob.  184/88, hr 82, rr20, spo2 100% RA.
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
## 2016-12-18 NOTE — ED Notes (Signed)
Pt unable to use restroom at this time.
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
## 2016-12-18 NOTE — ED Provider Notes (Signed)
Medical screening examination/treatment/procedure(s) were conducted as a shared visit with non-physician practitioner(s) and myself.  I personally evaluated the patient during the encounter.   EKG Interpretation  Date/Time:  Monday December 18 2016 11:16:23 EDT Ventricular Rate:  83 PR Interval:  158 QRS Duration: 80 QT Interval:  432 QTC Calculation: 507 R Axis:   3 Text Interpretation:  Normal sinus rhythm Possible Left atrial enlargement Prolonged QT Abnormal ECG Confirmed by Lacretia Leigh ([id number]) on 12/18/2016 12:22:23 PM     51 year old female presents with son onset of right-sided chest pain with some associated right upper extremity paresthesias and tingling. Called EMS and they given nitroglycerin which made her symptoms greatly improved. Denies any other neurological symptoms. On exam her neurological exam is at baseline from her prior stroke. Will cycle cardiac enzymes and likely admit to the hospital   Lacretia Leigh, MD 12/18/16 1242

## 2016-12-18 NOTE — ED Notes (Signed)
Patient transported to X-ray 

## 2016-12-18 NOTE — ED Notes (Signed)
Explained delay to pt no complaints noted at this time

## 2016-12-19 ENCOUNTER — Encounter (HOSPITAL_COMMUNITY): Payer: Self-pay | Admitting: *Deleted

## 2016-12-19 ENCOUNTER — Ambulatory Visit: Payer: Self-pay | Admitting: Family Medicine

## 2016-12-19 ENCOUNTER — Other Ambulatory Visit: Payer: Self-pay

## 2016-12-19 DIAGNOSIS — I639 Cerebral infarction, unspecified: Secondary | ICD-10-CM

## 2016-12-19 DIAGNOSIS — E538 Deficiency of other specified B group vitamins: Secondary | ICD-10-CM

## 2016-12-19 DIAGNOSIS — R072 Precordial pain: Secondary | ICD-10-CM

## 2016-12-19 DIAGNOSIS — Z87891 Personal history of nicotine dependence: Secondary | ICD-10-CM

## 2016-12-19 DIAGNOSIS — R748 Abnormal levels of other serum enzymes: Secondary | ICD-10-CM

## 2016-12-19 LAB — BASIC METABOLIC PANEL
Anion gap: 9 (ref 5–15)
BUN: 5 mg/dL — ABNORMAL LOW (ref 6–20)
CO2: 24 mmol/L (ref 22–32)
Calcium: 9.2 mg/dL (ref 8.9–10.3)
Chloride: 105 mmol/L (ref 101–111)
Creatinine, Ser: 0.84 mg/dL (ref 0.44–1.00)
GFR calc Af Amer: >60 mL/min (ref >60)
GFR calc non Af Amer: >60 mL/min (ref >60)
Glucose, Bld: 85 mg/dL (ref 65–99)
Potassium: 3.6 mmol/L (ref 3.5–5.1)
Sodium: 138 mmol/L (ref 135–145)

## 2016-12-19 LAB — CBC
HCT: 38.5 % (ref 36.0–46.0)
Hemoglobin: 12.6 g/dL (ref 12.0–15.0)
MCH: 27.9 pg (ref 26.0–34.0)
MCHC: 32.7 g/dL (ref 30.0–36.0)
MCV: 85.2 fL (ref 78.0–100.0)
Platelets: 191 10*3/uL (ref 150–400)
RBC: 4.52 MIL/uL (ref 3.87–5.11)
RDW: 13.6 % (ref 11.5–15.5)
WBC: 5.9 10*3/uL (ref 4.0–10.5)

## 2016-12-19 LAB — URINALYSIS, ROUTINE W REFLEX MICROSCOPIC
Bilirubin Urine: NEGATIVE
GLUCOSE, UA: NEGATIVE mg/dL
Hgb urine dipstick: NEGATIVE
Ketones, ur: NEGATIVE mg/dL
LEUKOCYTES UA: NEGATIVE
NITRITE: NEGATIVE
PH: 5 (ref 5.0–8.0)
Protein, ur: NEGATIVE mg/dL
SPECIFIC GRAVITY, URINE: 1.014 (ref 1.005–1.030)

## 2016-12-19 LAB — TROPONIN I
TROPONIN I: 0.04 ng/mL — AB (ref <0.03)
Troponin I: 0.03 ng/mL (ref <0.03)
Troponin I: <0.03 ng/mL (ref <0.03)
Troponin I: <0.03 ng/mL (ref <0.03)

## 2016-12-19 LAB — RAPID URINE DRUG SCREEN, HOSP PERFORMED
AMPHETAMINES: NOT DETECTED
BARBITURATES: NOT DETECTED
BENZODIAZEPINES: NOT DETECTED
COCAINE: NOT DETECTED
Opiates: NOT DETECTED
TETRAHYDROCANNABINOL: POSITIVE — AB

## 2016-12-19 LAB — VITAMIN B12: VITAMIN B 12: 167 pg/mL — AB (ref 180–914)

## 2016-12-19 MED ORDER — HYDRALAZINE HCL 20 MG/ML IJ SOLN
10.0000 mg | Freq: Four times a day (QID) | INTRAMUSCULAR | Status: DC | PRN
  Administered 2016-12-20: 10 mg via INTRAVENOUS
  Filled 2016-12-19: qty 1

## 2016-12-19 MED ORDER — CYANOCOBALAMIN 1000 MCG/ML IJ SOLN
1000.0000 ug | Freq: Once | INTRAMUSCULAR | Status: AC
  Administered 2016-12-19: 1000 ug via INTRAMUSCULAR
  Filled 2016-12-19: qty 1

## 2016-12-19 MED ORDER — AMLODIPINE BESYLATE 5 MG PO TABS
5.0000 mg | ORAL_TABLET | Freq: Every day | ORAL | Status: DC
  Administered 2016-12-19 – 2016-12-21 (×3): 5 mg via ORAL
  Filled 2016-12-19 (×3): qty 1

## 2016-12-19 NOTE — Patient Outreach (Signed)
[facility]) Care Management  12/19/2016  Brittany [name] 04/17/1965 [id number]   Update received from Natividad Brood, RN hospitial liaison.  Patient does not qualify for [facility] care management services. Patient will continue to receive automated EMMI stroke calls.  PLAN: Patient currently inpatient.  No further follow up needed by this RNCM at this time.   Quinn Plowman RN,BSN,CCM [facility] Telephonic  [phone]
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
## 2016-12-19 NOTE — ED Notes (Signed)
Attempted report 

## 2016-12-19 NOTE — Progress Notes (Signed)
Received pt in 6e room 21 around 1530.  A/Ox4. No complaints of pain while resting. States has pain in Left arm to touch and with movement. MM moist and pink.  HRR. LSC bilat.  Has weakness in left arm.  Can move left fingers. NPO was discontinued and vegetarian diet order was placed by Dr. Eliseo Squires. Pt was oriented to room, call bell, meal/medication times.  Pt is resting in bed with call light bell within reach.  Idolina Primer, RN

## 2016-12-19 NOTE — ED Notes (Signed)
Pt states that chest pain "comes and goes" but denies any current pain at this time.

## 2016-12-19 NOTE — ED Notes (Signed)
Pt moved to inpatient hospital bed while holding in ED

## 2016-12-19 NOTE — Discharge Planning (Signed)
Pt currently active with [facility] for PT/OT/SW services and has DME 3n1.

## 2016-12-19 NOTE — Progress Notes (Signed)
Called by nurse on [place] who stated that patient was told by cardiology that she can eat. Will place vegetarian diet.  Eulogio Bear DO

## 2016-12-20 ENCOUNTER — Observation Stay (HOSPITAL_COMMUNITY): Payer: Medicaid Other

## 2016-12-20 DIAGNOSIS — J029 Acute pharyngitis, unspecified: Secondary | ICD-10-CM | POA: Diagnosis not present

## 2016-12-20 DIAGNOSIS — Z79899 Other long term (current) drug therapy: Secondary | ICD-10-CM | POA: Diagnosis not present

## 2016-12-20 DIAGNOSIS — Z590 Homelessness: Secondary | ICD-10-CM | POA: Diagnosis not present

## 2016-12-20 DIAGNOSIS — E538 Deficiency of other specified B group vitamins: Secondary | ICD-10-CM | POA: Diagnosis present

## 2016-12-20 DIAGNOSIS — R079 Chest pain, unspecified: Secondary | ICD-10-CM

## 2016-12-20 DIAGNOSIS — I10 Essential (primary) hypertension: Secondary | ICD-10-CM | POA: Diagnosis present

## 2016-12-20 DIAGNOSIS — Z87891 Personal history of nicotine dependence: Secondary | ICD-10-CM | POA: Diagnosis not present

## 2016-12-20 DIAGNOSIS — Z91040 Latex allergy status: Secondary | ICD-10-CM | POA: Diagnosis not present

## 2016-12-20 DIAGNOSIS — I69354 Hemiplegia and hemiparesis following cerebral infarction affecting left non-dominant side: Secondary | ICD-10-CM | POA: Diagnosis not present

## 2016-12-20 DIAGNOSIS — R0789 Other chest pain: Secondary | ICD-10-CM | POA: Diagnosis present

## 2016-12-20 DIAGNOSIS — M79602 Pain in left arm: Secondary | ICD-10-CM | POA: Diagnosis present

## 2016-12-20 DIAGNOSIS — C9000 Multiple myeloma not having achieved remission: Secondary | ICD-10-CM | POA: Diagnosis present

## 2016-12-20 DIAGNOSIS — E669 Obesity, unspecified: Secondary | ICD-10-CM | POA: Diagnosis present

## 2016-12-20 DIAGNOSIS — E7800 Pure hypercholesterolemia, unspecified: Secondary | ICD-10-CM | POA: Diagnosis present

## 2016-12-20 DIAGNOSIS — Y92009 Unspecified place in unspecified non-institutional (private) residence as the place of occurrence of the external cause: Secondary | ICD-10-CM | POA: Diagnosis not present

## 2016-12-20 DIAGNOSIS — K589 Irritable bowel syndrome without diarrhea: Secondary | ICD-10-CM | POA: Diagnosis present

## 2016-12-20 DIAGNOSIS — Z888 Allergy status to other drugs, medicaments and biological substances status: Secondary | ICD-10-CM | POA: Diagnosis not present

## 2016-12-20 DIAGNOSIS — Y939 Activity, unspecified: Secondary | ICD-10-CM | POA: Diagnosis not present

## 2016-12-20 DIAGNOSIS — Z6829 Body mass index (BMI) 29.0-29.9, adult: Secondary | ICD-10-CM | POA: Diagnosis not present

## 2016-12-20 DIAGNOSIS — Z7982 Long term (current) use of aspirin: Secondary | ICD-10-CM | POA: Diagnosis not present

## 2016-12-20 DIAGNOSIS — Q211 Atrial septal defect: Secondary | ICD-10-CM | POA: Diagnosis not present

## 2016-12-20 DIAGNOSIS — Z885 Allergy status to narcotic agent status: Secondary | ICD-10-CM | POA: Diagnosis not present

## 2016-12-20 DIAGNOSIS — G629 Polyneuropathy, unspecified: Secondary | ICD-10-CM | POA: Diagnosis present

## 2016-12-20 MED ORDER — NITROGLYCERIN 0.4 MG SL SUBL
SUBLINGUAL_TABLET | SUBLINGUAL | Status: AC
  Administered 2016-12-20: 0.8 mg
  Filled 2016-12-20: qty 2

## 2016-12-20 MED ORDER — IOPAMIDOL (ISOVUE-370) INJECTION 76%
INTRAVENOUS | Status: AC
  Administered 2016-12-20: 100 mL
  Filled 2016-12-20: qty 100

## 2016-12-20 MED ORDER — METOPROLOL TARTRATE 5 MG/5ML IV SOLN
INTRAVENOUS | Status: AC
  Administered 2016-12-20 (×3): 5 mg
  Filled 2016-12-20: qty 15

## 2016-12-20 MED ORDER — VITAMIN B-12 1000 MCG PO TABS
1000.0000 ug | ORAL_TABLET | Freq: Every day | ORAL | Status: DC
  Administered 2016-12-20 – 2016-12-21 (×2): 1000 ug via ORAL
  Filled 2016-12-20 (×2): qty 1

## 2016-12-20 MED ORDER — PHENOL 1.4 % MT LIQD: 1.0000 | OROMUCOSAL | Status: DC | PRN

## 2016-12-20 NOTE — Progress Notes (Signed)
[facility]  Patient Status: Active (receiving services up to time of hospitalization)  [facility] is providing the following services: PT, OT and MSW  If patient discharges after hours, please call [phone].   Brittany [name] 12/20/2016, 11:39 AM
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
## 2016-12-21 MED ORDER — LISINOPRIL 40 MG PO TABS: 40.0000 mg | ORAL_TABLET | Freq: Every day | ORAL | 0 refills | Status: DC

## 2016-12-21 MED ORDER — AMLODIPINE BESYLATE 10 MG PO TABS: 10.0000 mg | ORAL_TABLET | Freq: Every day | ORAL | 0 refills | Status: DC

## 2016-12-21 MED ORDER — CYANOCOBALAMIN 1000 MCG PO TABS: 1000.0000 ug | ORAL_TABLET | Freq: Every day | ORAL | 0 refills | Status: DC

## 2016-12-21 MED FILL — AMLODIPINE BESYLATE 10 MG T: 10 | 30 days supply | Qty: 30 | Fill #0

## 2016-12-21 MED FILL — LISINOPRIL 40 MG TABLET: 40 | 30 days supply | Qty: 30 | Fill #0

## 2016-12-21 NOTE — Progress Notes (Signed)
   Negative coronary CT with calcium score of 0.  No further ischemia w/u needed at this time.  WIll sign off. Please call with questions.  Jettie Booze, MD
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
## 2016-12-21 NOTE — Care Management Note (Addendum)
Case Management Note  Patient Details  Name: Brittany [name] MRN: [id number] Date of Birth: Feb 25, 1966  Subjective/Objective:   Pt presented for Chest Pain- Previous Stroke. PTA pt was active with [facility] for PT, OT SW. Plan for d/c home today.                  Action/Plan: Resumption orders received from MD- [facility] aware that pt will d/c today. Hospital f/u scheduled and placed on AVS. Pt has transportation home. No further needs from CM at this time.   Expected Discharge Date:  12/21/16               Expected Discharge Plan:  [place]  In-House Referral:  Clinical Social Work  Discharge planning Services  CM Consult, Follow-up appt scheduled, [facility], Medication Assistance  Post Acute Care Choice:  Home Health, Resumption of Svcs/PTA Provider Choice offered to:  Patient  DME Arranged:  N/A DME Agency:  NA  HH Arranged:  RN, PT, OT, Social Work [employer] Agency:  [place]  Status of Service:  Completed, signed off  If discussed at [employer], dates discussed:    Additional Comments:  Bethena Roys, RN 12/21/2016, 11:22 AM

## 2016-12-22 ENCOUNTER — Telehealth: Payer: Self-pay

## 2016-12-22 NOTE — Telephone Encounter (Signed)
Brittany [name] with [facility] has called stating she would like verbal orders for patient to continue HHPT. Verbal orders given. Ardelia Mems Can be reached at [phone] if there are any questions or concerns.

## 2016-12-28 ENCOUNTER — Encounter: Payer: Self-pay | Admitting: Physical Medicine & Rehabilitation

## 2017-01-02 ENCOUNTER — Encounter: Payer: Self-pay | Admitting: Family Medicine

## 2017-01-02 ENCOUNTER — Ambulatory Visit (INDEPENDENT_AMBULATORY_CARE_PROVIDER_SITE_OTHER): Payer: Self-pay | Admitting: Family Medicine

## 2017-01-02 VITALS — BP 132/77 | HR 76 | Temp 97.7°F | Resp 12 | Ht 61.0 in | Wt 155.0 lb

## 2017-01-02 DIAGNOSIS — I10 Essential (primary) hypertension: Secondary | ICD-10-CM

## 2017-01-02 DIAGNOSIS — I639 Cerebral infarction, unspecified: Secondary | ICD-10-CM

## 2017-01-02 DIAGNOSIS — R531 Weakness: Secondary | ICD-10-CM

## 2017-01-02 LAB — POCT URINALYSIS DIP (DEVICE)
Glucose, UA: NEGATIVE mg/dL
HGB URINE DIPSTICK: NEGATIVE
LEUKOCYTES UA: NEGATIVE
NITRITE: NEGATIVE
Protein, ur: 30 mg/dL — AB
UROBILINOGEN UA: 0.2 mg/dL (ref 0.0–1.0)
pH: 5.5 (ref 5.0–8.0)

## 2017-01-02 MED ORDER — AMLODIPINE BESYLATE 10 MG PO TABS: 10.0000 mg | ORAL_TABLET | Freq: Every day | ORAL | 4 refills | Status: DC

## 2017-01-02 MED ORDER — GABAPENTIN 300 MG PO CAPS: 300.0000 mg | ORAL_CAPSULE | Freq: Three times a day (TID) | ORAL | 3 refills | Status: DC

## 2017-01-02 MED ORDER — ALBUTEROL SULFATE HFA 108 (90 BASE) MCG/ACT IN AERS: 2.0000 | INHALATION_SPRAY | RESPIRATORY_TRACT | 2 refills | Status: DC | PRN

## 2017-01-02 MED FILL — VENTOLIN HFA 90 MCG INHALER: 108 (90 BAS | 17 days supply | Qty: 18 | Fill #0

## 2017-01-02 MED FILL — GABAPENTIN 300 MG CAPSULE: 300 | 30 days supply | Qty: 90 | Fill #0

## 2017-01-02 MED FILL — AMLODIPINE BESYLATE 10 MG T: 10 | 30 days supply | Qty: 30 | Fill #0 | Status: TO

## 2017-01-02 NOTE — Progress Notes (Signed)
Patient ID: Brittany [name], female    DOB: [birth year], 51 y.o.   MRN: [id number]  PCP: Scot Jun, FNP  Chief Complaint  Patient presents with  . Establish Care  . Hospitalization Follow-up    Subjective:  HPI-New Patient  Brittany [name] is a 51 y.o. female presents to establish care and for hospital follow-up.  Patient is in wheelchairand reports that she is too weak to stand during visit. Medical problems significant for stroke, hypertension, former tobacco use, multiple myeloma, grade 1 diastolic dysfunction with preserved EF and B12 deficiency. Patient originally presented to [facility] emergency department on 12/05/2016 with non-intractable headache and accelerated hypertension. CT of the head was significant for an acute inferior superior medial right occipital lobe infarct, MRI confirmed acute left inferior cerebral infarct. Hemoglobin A1c 5.6. She recently had a TEE with loop recorder placed on 12/08/2016. She is currently on statin and aspirin therapy. Continue to complain of increased dyspnea with activity. She has to sleep with pillows elevating her head. Denies wheezing or persistent cough. Reports palpitations and accelerated heart rate that occur mostly at rest. No history of anxiety. She is currently participating  in physical therapy twice per week and residing with ith family friends. Continue to feel her balance is off. Reports double vision as result of stroke. She has history of marijuana use in past although notes that she has stopped since CVA.Marland Kitchen   Social History   Social History  . Marital status: Legally Separated    Spouse name: N/A  . Number of children: N/A  . Years of education: N/A   Occupational History  . Not on file.   Social History Main Topics  . Smoking status: Former Smoker    Quit date: 12/06/2015  . Smokeless tobacco: Never Used  . Alcohol use No  . Drug use: No  . Sexual activity: Not on file   Other Topics Concern  . Not on file    Social History Narrative  . No narrative on file    Family History  Problem Relation Age of Onset  . Hypertension Mother   . Sarcoidosis Mother   . Hypertension Father   . Stroke Maternal Uncle    Review of Systems See HPI  Patient Active Problem List   Diagnosis Date Noted  . B12 deficiency 12/19/2016  . Chest pain 12/18/2016  . Cerebellar infarct ([place])   . Diastolic dysfunction   . Marijuana abuse   . History of tobacco abuse   . Benign essential HTN   . Tachycardia   . Hypokalemia   . Stroke (cerebrum) ([place]) 12/05/2016  . Nonintractable headache 12/05/2016  . Occipital stroke ([place]) 12/05/2016  . Hypertensive urgency 12/05/2016  . Hypertension     Allergies  Allergen Reactions  . Darvon [Propoxyphene] Nausea And Vomiting  . Hydrocodone Nausea And Vomiting  . Lactose Intolerance (Gi)   . Other     States can't take pain meds that end in "cet" or meds that end in "ine" Darvocet  . Oxycodone Nausea And Vomiting  . Percocet [Oxycodone-Acetaminophen] Nausea And Vomiting  . Latex Itching and Rash    Prior to Admission medications   Medication Sig Start Date End Date Taking? Authorizing Provider  amLODipine (NORVASC) 10 MG tablet Take 1 tablet (10 mg total) by mouth daily. 12/21/16   Geradine Girt, DO  aspirin 325 MG tablet Take 1 tablet (325 mg total) by mouth daily. 12/09/16   Mikhail, Velta Addison, DO  atorvastatin (LIPITOR) 40  MG tablet Take 1 tablet (40 mg total) by mouth daily at 6 PM. 12/08/16   Cristal Ford, DO  dimenhyDRINATE (DRAMAMINE) 50 MG tablet Take 50 mg by mouth every 8 (eight) hours as needed for dizziness.    [provider]  docusate sodium (COLACE) 100 MG capsule Take 200 mg by mouth 2 (two) times daily.     [provider]  lisinopril (PRINIVIL,ZESTRIL) 40 MG tablet Take 1 tablet (40 mg total) by mouth daily. 12/21/16   Geradine Girt, DO  Multiple Vitamin (MULTIVITAMIN) capsule Take 4 capsules by mouth 2 (two) times daily.  Centrum plus    [provider]  senna (SENOKOT) 8.6 MG TABS tablet Take 2 tablets by mouth 2 (two) times daily.    [provider]  vitamin B-12 1000 MCG tablet Take 1 tablet (1,000 mcg total) by mouth daily. 12/21/16   Geradine Girt, DO    Past Medical, Surgical Family and Social History reviewed and updated.    Objective:   Today's Vitals   01/02/17 1036  BP: 132/77  Pulse: 76  Resp: 12  Temp: 97.7 F (36.5 C)  TempSrc: Oral  SpO2: 100%  Weight: 155 lb (70.3 kg)  Height: 5' 1"  (1.549 m)    Wt Readings from Last 3 Encounters:  12/21/16 156 lb 14.4 oz (71.2 kg)  12/08/16 162 lb (73.5 kg)   Physical Exam  Constitutional: She is oriented to person, place, and time. She appears well-developed and well-nourished.  HENT:  Head: Normocephalic and atraumatic.  Eyes: Pupils are equal, round, and reactive to light. Conjunctivae and EOM are normal.  Neck: Normal range of motion. Neck supple. No thyromegaly present.  Cardiovascular: Normal rate, regular rhythm, normal heart sounds and intact distal pulses.   No murmur heard. Pulmonary/Chest: Effort normal and breath sounds normal. She has no wheezes. She exhibits no tenderness.  Abdominal: Soft. Bowel sounds are normal.  Musculoskeletal: Normal range of motion.  Lymphadenopathy:    She has no cervical adenopathy.  Neurological: She is alert and oriented to person, place, and time.  Limited neuro exam   Skin: Skin is warm and dry.  Psychiatric: Her behavior is normal. Judgment and thought content normal.  Flat affect    Assessment & Plan:  1. Cerebrovascular accident (CVA), unspecified mechanism ([place]), reports an intolerance statin. -continue aspirin and stroke rehabilitation. Keep scheduled follow-up with Dr. Leonie Man at [facility].  2. Essential hypertension, Controlled  -Continue amlodipine 10 mg once daily.    3. Weakness - Continue physical therapy sessions.   Orders Placed This Encounter   Procedures  . POCT urinalysis dip (device)     Meds ordered this encounter  Medications  . DISCONTD: gabapentin (NEURONTIN) 300 MG capsule    Sig: Take 1 capsule (300 mg total) by mouth 3 (three) times daily.    Dispense:  90 capsule    Refill:  3    Order Specific Question:   Supervising Provider    Answer:   Tresa Garter [id number]  . DISCONTD: amLODipine (NORVASC) 10 MG tablet    Sig: Take 1 tablet (10 mg total) by mouth daily.    Dispense:  30 tablet    Refill:  4    Order Specific Question:   Supervising Provider    Answer:   Tresa Garter [id number]  . DISCONTD: albuterol (PROVENTIL HFA;VENTOLIN HFA) 108 (90 Base) MCG/ACT inhaler    Sig: Inhale 2 puffs into the lungs every 4 (four) hours  as needed for wheezing or shortness of breath (cough, shortness of breath or wheezing.).    Dispense:  1 Inhaler    Refill:  2    Order Specific Question:   Supervising Provider    Answer:   Tresa Garter [id number]  . gabapentin (NEURONTIN) 300 MG capsule    Sig: Take 1 capsule (300 mg total) by mouth 3 (three) times daily.    Dispense:  90 capsule    Refill:  3    Order Specific Question:   Supervising Provider    Answer:   Tresa Garter [id number]  . albuterol (PROVENTIL HFA;VENTOLIN HFA) 108 (90 Base) MCG/ACT inhaler    Sig: Inhale 2 puffs into the lungs every 4 (four) hours as needed for wheezing or shortness of breath (cough, shortness of breath or wheezing.).    Dispense:  1 Inhaler    Refill:  2    Order Specific Question:   Supervising Provider    Answer:   Tresa Garter [id number]  . amLODipine (NORVASC) 10 MG tablet    Sig: Take 1 tablet (10 mg total) by mouth daily.    Dispense:  30 tablet    Refill:  4    Order Specific Question:   Supervising Provider    Answer:   Tresa Garter [[id number]]    Return for care 2 months for chronic condition management and CPE  Carroll Sage. Kenton Kingfisher, MSN, FNP-C [facility]  [address] [place], [place] [address] [phone]

## 2017-01-02 NOTE — Patient Instructions (Signed)
For shortness of breath have prescribed albuterol. You may use 2 puffs every 4-6 hours as needed for shortness of breath. If symptoms persist please follow up here at the office or go to the emergency department.  Your  blood pressure looks good today continue amlodipine 10 mg once daily.  Complete the [place] patient assistance application in order to get some financial assistance so that we can address the rest of your health care needs with specialist referrals.   Rehabilitation After a Stroke, Adult A stroke causes damage to the brain cells, which can affect your ability to walk, talk, or remember things. The impact of a stroke is different for everyone, and so is recovery. Some people have progress during the first few days after treatment. Others may take weeks or longer to make progress. Stroke rehabilitation includes a variety of treatments to help you recover and promote your independence after a stroke. You may not be able do everything that you did before the stroke, but you can learn ways to manage your lifestyle and be as independent as possible. Rehabilitation will start as soon as you are able to participate after your stroke, and it involves care from a team that may include:  Family and friends. Your loved ones know you best and can be very helpful in your recovery.  Physicians.  Nurses.  Physical and occupational therapists.  Speech-language therapists.  A nutritionist.  A psychologist.  A Education officer, museum.  Keep open communication with all members of your care team. Share your medical records if needed, and take notes about each provider's recommendations. What is physical therapy? Physical therapists (PTs) help you to improve your coordination, balance, and muscle strength. Physical therapy may involve:  Range of motion exercises.  Help to move between lying, sitting, and standing positions.  Walking with a cane or walker, if needed.  Help using  stairs.  What is occupational therapy? Occupational therapists (OTs) help you rebuild your ability to do everyday tasks, such as brushing your teeth, going to the bathroom, eating, and getting dressed. Occupational therapy may also help with:  Vision. Visual scanning is a technique that is used to prevent falls.  Memory and cognitive training. This therapy includes problem-solving techniques and relearning tasks like making a phone call.  Fine muscle movements such as buttoning a shirt or picking up small objects.  What is speech therapy? Speech-language therapists help you communicate. After a stroke, you may have problems understanding what people are saying, or you may have trouble writing, speaking, or finding the right word for what you want to say. You may also need speech therapy if you have difficulty swallowing while eating and drinking. Examples of speech-language therapies include:  Techniques to strengthen muscles used in swallowing.  Naming objects or describing pictures. This helps retrain the brain to recognize and remember words.  Exercises to strengthen the muscles involved in talking, including your tongue and lips.  Exercises to retrain your brain in understanding what you read and hear.  How often will I need therapy? Therapy will begin as soon as you are able to participate, which is often within the first few days after a stroke. Sessions will be frequent at first. For example, you may have therapy 2-3 hours a day on most days of the week during the first few months. The intensity depends on the type and severity of your stroke. You may need therapy for several months. Therapy may take place in the hospital, at a rehabilitation  center, or in your home. Are there any side effects of therapy? Therapy is safe and is usually well-tolerated. You may feel physically and mentally tired after therapy, especially during the first few weeks. Rest before therapy sessions if you  need to so you can get the most out of your rehabilitation. Follow these instructions at home:  Involve your family and friends in your recovery, if possible. Having another person to encourage you is beneficial.  Follow instructions from your speech-language therapist, nutritionist, or health care provider about what you can safely eat and drink. Eat healthy foods. If your ability to swallow was affected by the stroke, you may need to take steps to avoid choking, such as: ? Taking small bites when eating. ? Eating foods that are soft or pureed. ? Drinking liquids that have been thickened.  Maintain social connections and interactions with friends, family, and community groups. This is an important part of your recovery. Communication challenges and physical challenges may cause you to feel isolated after a stroke.  Consider joining a support group that allows you to talk about the impact of stroke on your life. A psychologist or counselor may be recommended. Your emotional recovery from stroke is just as important as your physical recovery.  Keep all follow-up visits as told by your health care providers. This is important. Summary  Stroke rehabilitation includes a variety of treatments to help you recover and promote your independence after a stroke.  Rehabilitation will start as soon as you are able to participate after your stroke, and it includes care from a team of experts.  The intensity of therapy depends on the type and severity of your stroke. You may need therapy for several months. This information is not intended to replace advice given to you by your health care provider. Make sure you discuss any questions you have with your health care provider. Document Released: 03/26/2007 Document Revised: 03/07/2016 Document Reviewed: 03/07/2016 Elsevier Interactive Patient Education  2017 [employer].

## 2017-01-04 ENCOUNTER — Telehealth: Payer: Self-pay

## 2017-01-04 NOTE — Telephone Encounter (Signed)
[place] worker called [phone]) requesting verbal orders for a 1 time visit eval, verbal orders approved and informed her to fax written documentation to ordering doctor

## 2017-01-11 ENCOUNTER — Ambulatory Visit: Payer: Self-pay | Admitting: Neurology

## 2017-01-11 ENCOUNTER — Telehealth: Payer: Self-pay

## 2017-01-11 NOTE — Telephone Encounter (Signed)
Contact home health nurse to advise patient she needs to be seen tomorrow.  She will have to be seen by [name] as I have no availability on my schedule for tomorrow.  If she experiences any worsening neurological symptoms, headache, dizziness, chest pain, shortness of breath she will actually need to go to the emergency department.  Carroll Sage. Kenton Kingfisher, MSN, FNP-C [facility]  [address] [place], [place] [address] [phone]

## 2017-01-11 NOTE — Telephone Encounter (Signed)
Shay with [facility] called and stated that patient vitals today were 170/98, temp-97.8, and O2 was 96.  Patient states that the rash is coming back and that she is having blurred vision off and on. Patient bp on Tuesday was 180/88. Please advise [phone]

## 2017-01-12 NOTE — Telephone Encounter (Signed)
Left a vm for Shay to have patient schedule appointment

## 2017-01-15 ENCOUNTER — Encounter: Payer: Self-pay | Admitting: Family Medicine

## 2017-01-15 ENCOUNTER — Ambulatory Visit (INDEPENDENT_AMBULATORY_CARE_PROVIDER_SITE_OTHER): Payer: Self-pay | Admitting: Family Medicine

## 2017-01-15 VITALS — BP 144/72 | HR 94 | Temp 98.1°F | Resp 16 | Ht 61.0 in | Wt 161.0 lb

## 2017-01-15 DIAGNOSIS — I10 Essential (primary) hypertension: Secondary | ICD-10-CM

## 2017-01-15 DIAGNOSIS — L509 Urticaria, unspecified: Secondary | ICD-10-CM

## 2017-01-15 MED ORDER — TRIAMCINOLONE ACETONIDE 0.1 % EX CREA: 1.0000 "application " | TOPICAL_CREAM | Freq: Two times a day (BID) | CUTANEOUS | 0 refills | Status: DC

## 2017-01-15 MED ORDER — METHYLPREDNISOLONE SODIUM SUCC 125 MG IJ SOLR
62.5000 mg | Freq: Once | INTRAMUSCULAR | Status: AC
  Administered 2017-01-15: 62.5 mg via INTRAMUSCULAR

## 2017-01-15 MED ORDER — PREDNISONE 20 MG PO TABS: 40.0000 mg | ORAL_TABLET | Freq: Every day | ORAL | 0 refills | Status: DC

## 2017-01-15 MED ORDER — NIFEDIPINE ER 30 MG PO TB24: 30.0000 mg | ORAL_TABLET | Freq: Every day | ORAL | 3 refills | Status: DC

## 2017-01-15 MED ORDER — DEXAMETHASONE SODIUM PHOSPHATE 10 MG/ML IJ SOLN
20.0000 mg | Freq: Once | INTRAMUSCULAR | Status: DC
Start: 2017-01-15 — End: 2017-01-15

## 2017-01-15 MED FILL — NIFEDIPINE ER 30 MG TABLET: 30 | 30 days supply | Qty: 30 | Fill #0

## 2017-01-15 MED FILL — TRIAMCINOLONE ACETONIDE 0.1: 0.1 | 30 days supply | Qty: 454 | Fill #0

## 2017-01-15 MED FILL — ?PREDNISONE 20MG TABLET: 20 | 5 days supply | Qty: 10 | Fill #0

## 2017-01-15 NOTE — Progress Notes (Signed)
Patient ID: Noelie Renfrow, female    DOB: 02-24-1966, 51 y.o.   MRN: [id number]  PCP: Scot Jun, FNP  Chief Complaint  Patient presents with  . Follow-up    blood pressure  . Rash    on back     Subjective:  HPI Woodrow Dulski is a 51 y.o. female presents for evaluation of hypertension and pruritic rash. Marka receives home health services several days weekly. Home health RN called on 01/11/17 to advise of BP elevated 170/98. Patient was asymptomatic and was only able to come into the office today.  Continues to experience shortness of breath intermittently. She notified me today that due to an ongoing worsening rash on her back another provider recently discontinue her lisinopril and Lipitor to evaluate if these medications were the source of the rash. The rash has persistent and is now present on her neck and lateral face. She admits that the rash started while in the hospital and has never completely resolved. She has only attempted relief with benadryl which induces sleep, only temporally relieves itching. Mohini reports that she lives in poor conditions and is concern that poor sanitation may also be contributing to her rash.  Social History   Social History  . Marital status: Legally Separated    Spouse name: N/A  . Number of children: N/A  . Years of education: N/A   Occupational History  . Not on file.   Social History Main Topics  . Smoking status: Former Smoker    Quit date: 12/06/2015  . Smokeless tobacco: Never Used  . Alcohol use No  . Drug use: No  . Sexual activity: Not on file   Other Topics Concern  . Not on file   Social History Narrative  . No narrative on file    Family History  Problem Relation Age of Onset  . Hypertension Mother   . Sarcoidosis Mother   . Hypertension Father   . Stroke Maternal Uncle    Review of Systems See HPI  Patient Active Problem List   Diagnosis Date Noted  . B12 deficiency 12/19/2016  . Chest pain  12/18/2016  . Cerebellar infarct ([place])   . Diastolic dysfunction   . Marijuana abuse   . History of tobacco abuse   . Benign essential HTN   . Tachycardia   . Hypokalemia   . Stroke (cerebrum) ([place]) 12/05/2016  . Nonintractable headache 12/05/2016  . Occipital stroke ([place]) 12/05/2016  . Hypertensive urgency 12/05/2016  . Hypertension     Allergies  Allergen Reactions  . Darvon [Propoxyphene] Nausea And Vomiting  . Hydrocodone Nausea And Vomiting  . Lactose Intolerance ([place])   . Other     States can't take pain meds that end in "cet" or meds that end in "ine" Darvocet  . Oxycodone Nausea And Vomiting  . Percocet [Oxycodone-Acetaminophen] Nausea And Vomiting  . Latex Itching and Rash    Prior to Admission medications   Medication Sig Start Date End Date Taking? Authorizing Provider  albuterol (PROVENTIL HFA;VENTOLIN HFA) 108 (90 Base) MCG/ACT inhaler Inhale 2 puffs into the lungs every 4 (four) hours as needed for wheezing or shortness of breath (cough, shortness of breath or wheezing.). 01/02/17  Yes Scot Jun, FNP  amLODipine (NORVASC) 10 MG tablet Take 1 tablet (10 mg total) by mouth daily. 01/02/17  Yes Scot Jun, FNP  aspirin 325 MG tablet Take 1 tablet (325 mg total) by mouth daily. 12/09/16  Yes Mikhail, Velta Addison, DO  dimenhyDRINATE (DRAMAMINE) 50 MG tablet Take 50 mg by mouth every 8 (eight) hours as needed for dizziness.   Yes [provider]  docusate sodium (COLACE) 100 MG capsule Take 200 mg by mouth 2 (two) times daily.    Yes [provider]  gabapentin (NEURONTIN) 300 MG capsule Take 1 capsule (300 mg total) by mouth 3 (three) times daily. 01/02/17  Yes Scot Jun, FNP  Multiple Vitamin (MULTIVITAMIN) capsule Take 4 capsules by mouth 2 (two) times daily. Centrum plus   Yes [provider]  senna (SENOKOT) 8.6 MG TABS tablet Take 2 tablets by mouth 2 (two) times daily.   Yes [provider]  vitamin B-12 1000  MCG tablet Take 1 tablet (1,000 mcg total) by mouth daily. Patient not taking: Reported on 01/15/2017 12/21/16   Geradine Girt, DO    Past Medical, Surgical Family and Social History reviewed and updated.    Objective:   Today's Vitals   01/15/17 1034 01/15/17 1050  BP: (!) 152/90 (!) 144/72  Pulse: 94   Resp: 16   Temp: 98.1 F (36.7 C)   TempSrc: Oral   SpO2: 100%   Weight: 161 lb (73 kg)   Height: 5\' 1"  (1.549 m)     Wt Readings from Last 3 Encounters:  01/15/17 161 lb (73 kg)  01/02/17 155 lb (70.3 kg)  12/21/16 156 lb 14.4 oz (71.2 kg)    Physical Exam  Constitutional: She is oriented to person, place, and time. She appears well-developed and well-nourished.  HENT:  Head: Normocephalic and atraumatic.  Eyes: Pupils are equal, round, and reactive to light. Conjunctivae and EOM are normal.  Neck: Normal range of motion. Neck supple.  Cardiovascular: Normal rate, regular rhythm, normal heart sounds and intact distal pulses.   Pulmonary/Chest: Effort normal and breath sounds normal. No respiratory distress. She has no wheezes. She exhibits no tenderness.  Musculoskeletal: Normal range of motion.  Neurological: She is alert and oriented to person, place, and time.  Skin: Skin is warm and dry. Rash noted. Rash is maculopapular.  Psychiatric: She has a normal mood and affect. Her behavior is normal. Judgment and thought content normal.   Assessment & Plan:  1. Essential hypertension, stable, uncontrolled. Will add procardia 30 mg with amlodipine to improve blood pressure. She is encouraged to adhere to [employer], physical activity as tolerated, and notify the office of any consistent increases of blood pressure greater than 150/90.  2. Hives - methylPREDNISolone sodium succinate (SOLU-MEDROL) 125 mg/2 mL injection 62.5 mg; Inject 1 mL (62.5 mg total) into the muscle once. -Start prednisone 40 mg x 5 days with breakfast.    Return for follow-up 4 weeks for chronic  condition management.   Carroll Sage. Kenton Kingfisher, MSN, FNP-C [facility]  [address] [place], [place] [address] [phone]

## 2017-01-15 NOTE — Patient Instructions (Signed)
Notify me here in office if BP is greater than 150/90.

## 2017-01-23 ENCOUNTER — Telehealth: Payer: Self-pay

## 2017-01-23 MED ORDER — POLYETHYLENE GLYCOL 3350 17 GM/SCOOP PO POWD: 17.0000 g | Freq: Two times a day (BID) | ORAL | 1 refills | Status: DC | PRN

## 2017-01-23 NOTE — Telephone Encounter (Signed)
Contact patient to advise she should resume medication, nifedipine. It is important for her to achieve good blood pressure control to prevent another stroke from occurring.  I will send over a prescription for Miralax to help minimize symptoms of constipation. This has been sent to [facility]. I will see her in office on 02/20/2017. She should also increase intake of foods rich in fiber to reduce constipation. Fiber recommendation is 25 grams per day.   Carroll Sage. Kenton Kingfisher, MSN, FNP-C [facility]  [address] [place],  [address] [phone]

## 2017-01-23 NOTE — Telephone Encounter (Signed)
Patient states that the Nifedipine is making her very constipated and she has stop taking it for last 2 days. Please advise

## 2017-01-23 NOTE — Telephone Encounter (Signed)
Patient notified

## 2017-02-06 MED FILL — !VENTOLIN HFA INHALER: 108 (90 BAS | 25 days supply | Qty: 18 | Fill #0

## 2017-02-06 MED FILL — AMLODIPINE BESYLATE 10 MG T: 10 | 30 days supply | Qty: 30 | Fill #0

## 2017-02-06 MED FILL — GABAPENTIN 300 MG CAPSULE: 300 | 30 days supply | Qty: 90 | Fill #0

## 2017-02-16 ENCOUNTER — Telehealth: Payer: Self-pay

## 2017-02-19 MED FILL — ?NIFEDIPINE ER 30 MG TABLET: 30 | 30 days supply | Qty: 30 | Fill #0

## 2017-02-19 NOTE — Telephone Encounter (Signed)
Handicap placard request is completed and placed on your desk.   Thanks,   Carroll Sage. Kenton Kingfisher, MSN, FNP-C [facility]  [address] [place], [place] [address] [phone]

## 2017-02-20 ENCOUNTER — Encounter: Payer: Self-pay | Admitting: Family Medicine

## 2017-02-20 ENCOUNTER — Ambulatory Visit (INDEPENDENT_AMBULATORY_CARE_PROVIDER_SITE_OTHER): Payer: Self-pay | Admitting: Family Medicine

## 2017-02-20 VITALS — BP 140/82 | HR 94 | Temp 97.9°F | Resp 14 | Ht 61.0 in | Wt 167.0 lb

## 2017-02-20 DIAGNOSIS — I10 Essential (primary) hypertension: Secondary | ICD-10-CM

## 2017-02-20 DIAGNOSIS — G629 Polyneuropathy, unspecified: Secondary | ICD-10-CM

## 2017-02-20 DIAGNOSIS — E538 Deficiency of other specified B group vitamins: Secondary | ICD-10-CM

## 2017-02-20 DIAGNOSIS — G4709 Other insomnia: Secondary | ICD-10-CM

## 2017-02-20 MED ORDER — HYDROXYZINE PAMOATE 50 MG PO CAPS: 50.0000 mg | ORAL_CAPSULE | Freq: Three times a day (TID) | ORAL | 0 refills | Status: DC | PRN

## 2017-02-20 MED ORDER — GABAPENTIN 300 MG PO CAPS: 600.0000 mg | ORAL_CAPSULE | Freq: Three times a day (TID) | ORAL | 3 refills | Status: DC

## 2017-02-20 MED ORDER — NIFEDIPINE ER 30 MG PO TB24: 60.0000 mg | ORAL_TABLET | Freq: Every day | ORAL | 3 refills | Status: DC

## 2017-02-20 MED FILL — HYDROXYZINE PAM 50 MG CAP: 50 | 5 days supply | Qty: 30 | Fill #0

## 2017-02-20 MED FILL — GABAPENTIN 300 MG CAPSULE: 300 | 30 days supply | Qty: 180 | Fill #0

## 2017-02-20 NOTE — Telephone Encounter (Signed)
Patient notified and will pick up form today at appointment

## 2017-02-20 NOTE — Progress Notes (Signed)
Patient ID: Brittany [name], female    DOB: 1965/06/06, 51 y.o.   MRN: [id number]  PCP: Scot Jun, FNP  Chief Complaint  Patient presents with  . Follow-up    2 MONTH    Subjective:  HPI Brittany [name] is a 51 y.o. female presents for evaluation of chronic condition 2 month follow-up. Medical problems significant for stroke, hypertension, former tobacco use, multiple myeloma, grade 1 diastolic dysfunction with preserved EF and B12 deficiency. [name] is ambulatory today, without use of wheelchair. She continues to complain of generalized weakness resulting from CVA. Today she complains persistent ongoing bilateral arm pain. This is not a new problem.  He characterizes the pain as sharp, burning, aching.  Times the pain is so severe that it radiates into the right side of her neck.  She has a long-standing history of chronic neuropathic pain.  Recent evaluation of the symptoms as she has no insurance.  Been unable to follow-up with neurology for post CVA follow-up due to lack of a payer source. Emsley reports home monitoring of blood pressure and obtaining readings within 676'H systolic and less than 90 diastolic.  Which are improved readings prior visits.Reports adherence to blood pressure medications. Reports efforts to adhere to low sodium diet. She is a nonsmoker, although a history of cigarette use. Denies headaches, dizziness, shortness of breath, or chest pain. Social History   Socioeconomic History  . Marital status: Legally Separated    Spouse name: Not on file  . Number of children: Not on file  . Years of education: Not on file  . Highest education level: Not on file  Social Needs  . Financial resource strain: Not on file  . Food insecurity - worry: Not on file  . Food insecurity - inability: Not on file  . Transportation needs - medical: Not on file  . Transportation needs - non-medical: Not on file  Occupational History  . Not on file  Tobacco Use  . Smoking  status: Former Smoker    Last attempt to quit: 12/06/2015    Years since quitting: 1.2  . Smokeless tobacco: Never Used  Substance and Sexual Activity  . Alcohol use: No  . Drug use: No  . Sexual activity: Not on file  Other Topics Concern  . Not on file  Social History Narrative  . Not on file    Family History  Problem Relation Age of Onset  . Hypertension Mother   . Sarcoidosis Mother   . Hypertension Father   . Stroke Maternal Uncle    Review of Systems  Constitutional: Negative for activity change, appetite change, chills, diaphoresis, fatigue, fever and unexpected weight change.  Respiratory: Negative.   Cardiovascular: Negative.   Gastrointestinal: Negative.   Genitourinary: Negative.   Skin: Negative.   Neurological: Positive for weakness. Negative for dizziness, facial asymmetry and headaches.  Hematological: Negative.   Psychiatric/Behavioral: Positive for sleep disturbance.       Insomnia-trouble falling and staying asleep    Patient Active Problem List   Diagnosis Date Noted  . B12 deficiency 12/19/2016  . Chest pain 12/18/2016  . Cerebellar infarct ([place])   . Diastolic dysfunction   . Marijuana abuse   . History of tobacco abuse   . Benign essential HTN   . Tachycardia   . Hypokalemia   . Stroke (cerebrum) ([place]) 12/05/2016  . Nonintractable headache 12/05/2016  . Occipital stroke ([place]) 12/05/2016  . Hypertensive urgency 12/05/2016  . Hypertension     Allergies  Allergen Reactions  . Darvon [Propoxyphene] Nausea And Vomiting  . Hydrocodone Nausea And Vomiting  . Lactose Intolerance ([place])   . Other     States can't take pain meds that end in "cet" or meds that end in "ine" Darvocet  . Oxycodone Nausea And Vomiting  . Percocet [Oxycodone-Acetaminophen] Nausea And Vomiting  . Latex Itching and Rash    Prior to Admission medications   Medication Sig Start Date End Date Taking? Authorizing Provider  albuterol (PROVENTIL HFA;VENTOLIN HFA) 108 (90  Base) MCG/ACT inhaler Inhale 2 puffs into the lungs every 4 (four) hours as needed for wheezing or shortness of breath (cough, shortness of breath or wheezing.). 01/02/17  Yes Scot Jun, FNP  amLODipine (NORVASC) 10 MG tablet Take 1 tablet (10 mg total) by mouth daily. 01/02/17  Yes Scot Jun, FNP  aspirin 325 MG tablet Take 1 tablet (325 mg total) by mouth daily. 12/09/16  Yes Mikhail, Velta Addison, DO  dimenhyDRINATE (DRAMAMINE) 50 MG tablet Take 50 mg by mouth every 8 (eight) hours as needed for dizziness.   Yes [provider]  gabapentin (NEURONTIN) 300 MG capsule Take 1 capsule (300 mg total) by mouth 3 (three) times daily. 01/02/17  Yes Scot Jun, FNP  NIFEdipine (PROCARDIA-XL/ADALAT CC) 30 MG 24 hr tablet Take 1 tablet (30 mg total) by mouth daily. 01/15/17  Yes Scot Jun, FNP  senna (SENOKOT) 8.6 MG TABS tablet Take 2 tablets by mouth 2 (two) times daily.   Yes [provider]  triamcinolone cream (KENALOG) 0.1 % Apply 1 application topically 2 (two) times daily. 01/15/17  Yes Scot Jun, FNP  vitamin B-12 1000 MCG tablet Take 1 tablet (1,000 mcg total) by mouth daily. 12/21/16  Yes Vann, Jessica U, DO  docusate sodium (COLACE) 100 MG capsule Take 200 mg by mouth 2 (two) times daily.     [provider]  Multiple Vitamin (MULTIVITAMIN) capsule Take 4 capsules by mouth 2 (two) times daily. Centrum plus    [provider]  polyethylene glycol powder (GLYCOLAX/MIRALAX) powder Take 17 g 2 (two) times daily as needed by mouth. Patient not taking: Reported on 02/20/2017 01/23/17   Scot Jun, FNP  predniSONE (DELTASONE) 20 MG tablet Take 2 tablets (40 mg total) by mouth daily with breakfast. Patient not taking: Reported on 02/20/2017 01/15/17   Scot Jun, FNP    Past Medical, Surgical Family and Social History reviewed and updated.    Objective:   Today's Vitals   02/20/17 1023  BP: 140/82  Pulse: 94   Resp: 14  Temp: 97.9 F (36.6 C)  TempSrc: Oral  SpO2: 100%  Weight: 167 lb (75.8 kg)  Height: 5' 1"  (1.549 m)    Wt Readings from Last 3 Encounters:  02/20/17 167 lb (75.8 kg)  01/15/17 161 lb (73 kg)  01/02/17 155 lb (70.3 kg)    Physical Exam Constitutional: She is oriented to person, place, and time. She appears well-developed and well-nourished.  HENT:  Head: Normocephalic and atraumatic.  Eyes: Pupils are equal, round, and reactive to light. Conjunctivae and EOM are normal.  Neck: Normal range of motion. Neck supple.  Cardiovascular: Normal rate, regular rhythm, normal heart sounds and intact distal pulses.   Pulmonary/Chest: Effort normal and breath sounds normal. No respiratory distress. She has no wheezes. She exhibits no tenderness.  Musculoskeletal: Normal range of motion.  Neurological: She is alert and oriented to person, place, and time. Decreased motor coordination. 4/5  BUE strength, Bilateral hand grips equal symmetrical.  Skin: Skin is warm and dry.   Psychiatric: She has a normal mood and affect. Her behavior is normal. Judgment and thought content normal.    Assessment & Plan:  1. 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  Patient likely needs a complete conduction test by neurology.  Patient has been given a [facility] financial assistance application to complete in order to obtain a payer source.  He has a history of B12 deficiency we will repeat a B12 level today.  She chronically takes B12 1000 mcg daily.  Gabapentin 600 mg 3 times daily neuropathic pain symptoms.  2. Other insomnia, problem has occurred intermittently post CVA will trial Vistaril 50 mg up to 2 times daily as needed, dose 200 mg at bedtime as needed for sleep.  3. Hypertension, unspecified type, controlled today at goal 140/80 or less.We have discussed target BP range and blood pressure goal. I have advised patient to check BP regularly and to call us back or  report to clinic if the numbers are consistently higher than 140/90. We discussed the importance of compliance with medical therapy and DASH diet recommended, consequences of uncontrolled hypertension discussed.  - continue current BP medications  4. B12 deficiency, checking B12 level today.  Patient continues to experience profound neuropathic pain of the upper extremities. Continue B12 1000 mcg daily for now.   Patient provided [facility] Application.    Meds ordered this encounter  Medications  . gabapentin (NEURONTIN) 300 MG capsule    Sig: Take 2 capsules (600 mg total) by mouth 3 (three) times daily.    Dispense:  120 capsule    Refill:  3    Order Specific Question:   Supervising Provider    Answer:   Tresa Garter [id number]  . hydrOXYzine (VISTARIL) 50 MG capsule    Sig: Take 1-2 capsules (50-100 mg total) by mouth 3 (three) times daily as needed for itching.    Dispense:  30 capsule    Refill:  0    Order Specific Question:   Supervising Provider    Answer:   Tresa Garter [id number]  . NIFEdipine (PROCARDIA-XL/ADALAT CC) 30 MG 24 hr tablet    Sig: Take 2 tablets (60 mg total) by mouth daily.    Dispense:  60 tablet    Refill:  3    Order Specific Question:   Supervising Provider    Answer:   Tresa Garter [id number]    Orders Placed This Encounter  Procedures  . Vitamin B12     RTC: 3 months for chronic condition follow-up.   Carroll Sage. Kenton Kingfisher, MSN, FNP-C [facility]  [address] [place], [place] [address] [phone]

## 2017-02-20 NOTE — Patient Instructions (Addendum)
Increased Gabapentin 600 mg, 3 times daily. I am also increasing your nifedipine 60 mg once daily in efforts to improve your blood pressure.  I am checking a B12 level to ensure is not the cause of your worsening neuropathy.  Please complete the [facility] financial assistance application in order to be evaluated by neuro specialist.   Peripheral Neuropathy Peripheral neuropathy is a type of nerve damage. It affects nerves that carry signals between the spinal cord and other parts of the body. These are called peripheral nerves. With peripheral neuropathy, one nerve or a group of nerves may be damaged. What are the causes? Many things can damage peripheral nerves. For some people with peripheral neuropathy, the cause is unknown. Some causes include:  Diabetes. This is the most common cause of peripheral neuropathy.  Injury to a nerve.  Pressure or stress on a nerve that lasts a long time.  Too little vitamin B. Alcoholism can lead to this.  Infections.  Autoimmune diseases, such as multiple sclerosis and systemic lupus erythematosus.  Inherited nerve diseases.  Some medicines, such as cancer drugs.  Toxic substances, such as lead and mercury.  Too little blood flowing to the legs.  Kidney disease.  Thyroid disease.  What are the signs or symptoms? Different people have different symptoms. The symptoms you have will depend on which of your nerves is damaged. Common symptoms include:  Loss of feeling (numbness) in the feet and hands.  Tingling in the feet and hands.  Pain that burns.  Very sensitive skin.  Weakness.  Not being able to move a part of the body (paralysis).  Muscle twitching.  Clumsiness or poor coordination.  Loss of balance.  Not being able to control your bladder.  Feeling dizzy.  Sexual problems.  How is this diagnosed? Peripheral neuropathy is a symptom, not a disease. Finding the cause of peripheral neuropathy can be hard. To figure that  out, your health care provider will take a medical history and do a physical exam. A neurological exam will also be done. This involves checking things affected by your brain, spinal cord, and nerves (nervous system). For example, your health care provider will check your reflexes, how you move, and what you can feel. Other types of tests may also be ordered, such as:  Blood tests.  A test of the fluid in your spinal cord.  Imaging tests, such as CT scans or an MRI.  Electromyography (EMG). This test checks the nerves that control muscles.  Nerve conduction velocity tests. These tests check how fast messages pass through your nerves.  Nerve biopsy. A small piece of nerve is removed. It is then checked under a microscope.  How is this treated?  Medicine is often used to treat peripheral neuropathy. Medicines may include: ? Pain-relieving medicines. Prescription or over-the-counter medicine may be suggested. ? Antiseizure medicine. This may be used for pain. ? Antidepressants. These also may help ease pain from neuropathy. ? Lidocaine. This is a numbing medicine. You might wear a patch or be given a shot. ? Mexiletine. This medicine is typically used to help control irregular heart rhythms.  Surgery. Surgery may be needed to relieve pressure on a nerve or to destroy a nerve that is causing pain.  Physical therapy to help movement.  Assistive devices to help movement. Follow these instructions at home:  Only take over-the-counter or prescription medicines as directed by your health care provider. Follow the instructions carefully for any given medicines. Do not take any  other medicines without first getting approval from your health care provider.  If you have diabetes, work closely with your health care provider to keep your blood sugar under control.  If you have numbness in your feet: ? Check every day for signs of injury or infection. Watch for redness, warmth, and  swelling. ? Wear padded socks and comfortable shoes. These help protect your feet.  Do not do things that put pressure on your damaged nerve.  Do not smoke. Smoking keeps blood from getting to damaged nerves.  Avoid or limit alcohol. Too much alcohol can cause a lack of B vitamins. These vitamins are needed for healthy nerves.  Develop a good support system. Coping with peripheral neuropathy can be stressful. Talk to a mental health specialist or join a support group if you are struggling.  Follow up with your health care provider as directed. Contact a health care provider if:  You have new signs or symptoms of peripheral neuropathy.  You are struggling emotionally from dealing with peripheral neuropathy.  You have a fever. Get help right away if:  You have an injury or infection that is not healing.  You feel very dizzy or begin vomiting.  You have chest pain.  You have trouble breathing. This information is not intended to replace advice given to you by your health care provider. Make sure you discuss any questions you have with your health care provider. Document Released: 02/24/2002 Document Revised: 08/12/2015 Document Reviewed: 11/11/2012 Elsevier Interactive Patient Education  2017 [employer]  Hypertension Hypertension is another name for high blood pressure. High blood pressure forces your heart to work harder to pump blood. This can cause problems over time. There are two numbers in a blood pressure reading. There is a top number (systolic) over a bottom number (diastolic). It is best to have a blood pressure below 120/80. Healthy choices can help lower your blood pressure. You may need medicine to help lower your blood pressure if:  Your blood pressure cannot be lowered with healthy choices.  Your blood pressure is higher than 130/80.  Follow these instructions at home: Eating and drinking  If directed, follow the DASH eating plan. This diet  includes: ? Filling half of your plate at each meal with fruits and vegetables. ? Filling one quarter of your plate at each meal with whole grains. Whole grains include whole wheat pasta, brown rice, and whole grain bread. ? Eating or drinking low-fat dairy products, such as skim milk or low-fat yogurt. ? Filling one quarter of your plate at each meal with low-fat (lean) proteins. Low-fat proteins include fish, skinless chicken, eggs, beans, and tofu. ? Avoiding fatty meat, cured and processed meat, or chicken with skin. ? Avoiding premade or processed food.  Eat less than 1,500 mg of salt (sodium) a day.  Limit alcohol use to no more than 1 drink a day for nonpregnant women and 2 drinks a day for men. One drink equals 12 oz of beer, 5 oz of wine, or 1 oz of hard liquor. Lifestyle  Work with your doctor to stay at a healthy weight or to lose weight. Ask your doctor what the best weight is for you.  Get at least 30 minutes of exercise that causes your heart to beat faster (aerobic exercise) most days of the week. This may include walking, swimming, or biking.  Get at least 30 minutes of exercise that strengthens your muscles (resistance exercise) at least 3 days a week. This may include  lifting weights or pilates.  Do not use any products that contain nicotine or tobacco. This includes cigarettes and e-cigarettes. If you need help quitting, ask your doctor.  Check your blood pressure at home as told by your doctor.  Keep all follow-up visits as told by your doctor. This is important. Medicines  Take over-the-counter and prescription medicines only as told by your doctor. Follow directions carefully.  Do not skip doses of blood pressure medicine. The medicine does not work as well if you skip doses. Skipping doses also puts you at risk for problems.  Ask your doctor about side effects or reactions to medicines that you should watch for. Contact a doctor if:  You think you are having a  reaction to the medicine you are taking.  You have headaches that keep coming back (recurring).  You feel dizzy.  You have swelling in your ankles.  You have trouble with your vision. Get help right away if:  You get a very bad headache.  You start to feel confused.  You feel weak or numb.  You feel faint.  You get very bad pain in your: ? Chest. ? Belly (abdomen).  You throw up (vomit) more than once.  You have trouble breathing. Summary  Hypertension is another name for high blood pressure.  Making healthy choices can help lower blood pressure. If your blood pressure cannot be controlled with healthy choices, you may need to take medicine. This information is not intended to replace advice given to you by your health care provider. Make sure you discuss any questions you have with your health care provider. Document Released: 08/23/2007 Document Revised: 02/02/2016 Document Reviewed: 02/02/2016 Elsevier Interactive Patient Education  [employer].

## 2017-02-21 ENCOUNTER — Ambulatory Visit: Payer: Self-pay | Admitting: Neurology

## 2017-02-21 LAB — VITAMIN B12: VITAMIN B 12: 1100 pg/mL (ref 232–1245)

## 2017-02-28 ENCOUNTER — Ambulatory Visit: Payer: Medicaid Other

## 2017-03-06 ENCOUNTER — Ambulatory Visit: Payer: Medicaid Other

## 2017-03-06 ENCOUNTER — Ambulatory Visit: Payer: Self-pay | Admitting: Family Medicine

## 2017-03-06 MED FILL — AMLODIPINE BESYLATE 10 MG T: 10 | 30 days supply | Qty: 30 | Fill #1

## 2017-03-06 MED FILL — ?NIFEDIPINE ER 30 MG TABLET: 30 | 30 days supply | Qty: 60 | Fill #0

## 2017-04-05 MED FILL — AMLODIPINE BESYLATE 10 MG T: 10 | 30 days supply | Qty: 30 | Fill #2

## 2017-04-05 MED FILL — ?NIFEDIPINE ER 30 MG TABLET: 30 | 30 days supply | Qty: 60 | Fill #1

## 2017-05-09 MED FILL — AMLODIPINE BESYLATE 10 MG T: 10 | 30 days supply | Qty: 30 | Fill #3

## 2017-05-09 MED FILL — GABAPENTIN 300 MG CAPSULE: 300 | 30 days supply | Qty: 180 | Fill #1

## 2017-05-10 MED FILL — ?NIFEDIPINE ER 30MG TAB: 30 | 30 days supply | Qty: 60 | Fill #0

## 2017-05-21 ENCOUNTER — Encounter: Payer: Self-pay | Admitting: Family Medicine

## 2017-05-21 ENCOUNTER — Ambulatory Visit (INDEPENDENT_AMBULATORY_CARE_PROVIDER_SITE_OTHER): Payer: Self-pay | Admitting: Family Medicine

## 2017-05-21 VITALS — BP 132/74 | HR 90 | Temp 98.0°F | Ht 61.0 in | Wt 169.0 lb

## 2017-05-21 DIAGNOSIS — I10 Essential (primary) hypertension: Secondary | ICD-10-CM

## 2017-05-21 DIAGNOSIS — M791 Myalgia, unspecified site: Secondary | ICD-10-CM

## 2017-05-21 DIAGNOSIS — E538 Deficiency of other specified B group vitamins: Secondary | ICD-10-CM

## 2017-05-21 DIAGNOSIS — I639 Cerebral infarction, unspecified: Secondary | ICD-10-CM

## 2017-05-21 DIAGNOSIS — G629 Polyneuropathy, unspecified: Secondary | ICD-10-CM

## 2017-05-21 LAB — POCT URINALYSIS DIP (DEVICE)
Bilirubin Urine: NEGATIVE
GLUCOSE, UA: NEGATIVE mg/dL
HGB URINE DIPSTICK: NEGATIVE
Ketones, ur: NEGATIVE mg/dL
LEUKOCYTES UA: NEGATIVE
NITRITE: NEGATIVE
Protein, ur: NEGATIVE mg/dL
Specific Gravity, Urine: 1.02 (ref 1.005–1.030)
Urobilinogen, UA: 0.2 mg/dL (ref 0.0–1.0)
pH: 6.5 (ref 5.0–8.0)

## 2017-05-21 MED ORDER — FUROSEMIDE 20 MG PO TABS: 20.0000 mg | ORAL_TABLET | Freq: Every day | ORAL | 3 refills | Status: DC | PRN

## 2017-05-21 MED ORDER — METHOCARBAMOL 500 MG PO TABS: 500.0000 mg | ORAL_TABLET | Freq: Three times a day (TID) | ORAL | 2 refills | Status: DC

## 2017-05-21 MED ORDER — POTASSIUM CHLORIDE CRYS ER 20 MEQ PO TBCR: 20.0000 meq | EXTENDED_RELEASE_TABLET | Freq: Every day | ORAL | 3 refills | Status: DC | PRN

## 2017-05-21 MED ORDER — KETOROLAC TROMETHAMINE 30 MG/ML IJ SOLN
30.0000 mg | Freq: Once | INTRAMUSCULAR | Status: AC
  Administered 2017-05-21: 30 mg via INTRAMUSCULAR

## 2017-05-21 MED FILL — FUROSEMIDE 20 MG TABLET: 20 | 30 days supply | Qty: 30 | Fill #0

## 2017-05-21 MED FILL — METHOCARBAMOL 500 MG TABLET: 500 | 30 days supply | Qty: 90 | Fill #0

## 2017-05-21 MED FILL — POTASSIUM CL ER 20 MEQ TAB: 20 | 30 days supply | Qty: 30 | Fill #0

## 2017-05-21 NOTE — Progress Notes (Signed)
Patient ID: Brittany [name], female    DOB: 05-01-1965, 52 y.o.   MRN: [id number]  PCP: Scot Jun, FNP  Chief Complaint  Patient presents with  . Follow-up    3 month on chronic condition     Subjective:  HPI Brittany [name] is a 52 y.o. female with CVA, Hypertension, Chronic Diastolic Dysfunction, and immobility presents for hypertension and chronic neuropathy follow-up.  Brittany [name] was referred to follow-up with neurology during her last office visit and she reports due to lack of payer source she is been unable to proceed with the specialty visit.  She reports she has attempted to complete financial assistance however she can not have any one verify that she is homeless as she moves around from home to home and no one is willing to certify that she is homeless. She reports that she is currently in the middle of a Medicaid and disability appeal and has undergone hearings and is currently waiting for approval.  She continues to have neuropathic pain to both upper and lower extremities.  She continues to ambulate on a walker. Today she complains of bilateral lower leg edema which only resolved with wearing compression stockings.  Once the compression stockings are removed the swelling returns.  She reports that the swelling is so severe at times she is unable to wear shoes. Reports compliance with BP medications. In the past, BP had been difficult to control. She denies headaches, chest pain, shortness of breath, new weakness, or dizziness. Current Body mass index is 31.93 kg/m.    Component Value Date/Time   BILITOT 0.6 12/06/2016 0749  .  Social History   Socioeconomic History  . Marital status: Legally Separated    Spouse name: Not on file  . Number of children: Not on file  . Years of education: Not on file  . Highest education level: Not on file  Social Needs  . Financial resource strain: Not on file  . Food insecurity - worry: Not on file  . Food insecurity - inability: Not  on file  . Transportation needs - medical: Not on file  . Transportation needs - non-medical: Not on file  Occupational History  . Not on file  Tobacco Use  . Smoking status: Former Smoker    Last attempt to quit: 12/06/2015    Years since quitting: 1.4  . Smokeless tobacco: Never Used  Substance and Sexual Activity  . Alcohol use: No  . Drug use: No  . Sexual activity: Not on file  Other Topics Concern  . Not on file  Social History Narrative  . Not on file    Family History  Problem Relation Age of Onset  . Hypertension Mother   . Sarcoidosis Mother   . Hypertension Father   . Stroke Maternal Uncle    Review of Systems Patient Active Problem List   Diagnosis Date Noted  . B12 deficiency 12/19/2016  . Chest pain 12/18/2016  . Cerebellar infarct ([place])   . Diastolic dysfunction   . Marijuana abuse   . History of tobacco abuse   . Benign essential HTN   . Tachycardia   . Hypokalemia   . Stroke (cerebrum) ([place]) 12/05/2016  . Nonintractable headache 12/05/2016  . Occipital stroke ([place]) 12/05/2016  . Hypertensive urgency 12/05/2016  . Hypertension     Allergies  Allergen Reactions  . Darvon [Propoxyphene] Nausea And Vomiting  . Hydrocodone Nausea And Vomiting  . Lactose Intolerance ([place])   . Other  States can't take pain meds that end in "cet" or meds that end in "ine" Darvocet  . Oxycodone Nausea And Vomiting  . Percocet [Oxycodone-Acetaminophen] Nausea And Vomiting  . Latex Itching and Rash    Prior to Admission medications   Medication Sig Start Date End Date Taking? Authorizing Provider  albuterol (PROVENTIL HFA;VENTOLIN HFA) 108 (90 Base) MCG/ACT inhaler Inhale 2 puffs into the lungs every 4 (four) hours as needed for wheezing or shortness of breath (cough, shortness of breath or wheezing.). 01/02/17  Yes Scot Jun, FNP  amLODipine (NORVASC) 10 MG tablet Take 1 tablet (10 mg total) by mouth daily. 01/02/17  Yes Scot Jun, FNP  aspirin  325 MG tablet Take 1 tablet (325 mg total) by mouth daily. 12/09/16  Yes Mikhail, Velta Addison, DO  dimenhyDRINATE (DRAMAMINE) 50 MG tablet Take 50 mg by mouth every 8 (eight) hours as needed for dizziness.   Yes [provider]  docusate sodium (COLACE) 100 MG capsule Take 200 mg by mouth 2 (two) times daily.    Yes [provider]  gabapentin (NEURONTIN) 300 MG capsule Take 2 capsules (600 mg total) by mouth 3 (three) times daily. 02/20/17  Yes Scot Jun, FNP  hydrOXYzine (VISTARIL) 50 MG capsule Take 1-2 capsules (50-100 mg total) by mouth 3 (three) times daily as needed for itching. 02/20/17  Yes Scot Jun, FNP  Multiple Vitamin (MULTIVITAMIN) capsule Take 4 capsules by mouth 2 (two) times daily. Centrum plus   Yes [provider]  NIFEdipine (PROCARDIA-XL/ADALAT CC) 30 MG 24 hr tablet Take 2 tablets (60 mg total) by mouth daily. 02/20/17  Yes Scot Jun, FNP  polyethylene glycol powder (GLYCOLAX/MIRALAX) powder Take 17 g 2 (two) times daily as needed by mouth. 01/23/17  Yes Scot Jun, FNP  senna (SENOKOT) 8.6 MG TABS tablet Take 2 tablets by mouth 2 (two) times daily.   Yes [provider]  triamcinolone cream (KENALOG) 0.1 % Apply 1 application topically 2 (two) times daily. 01/15/17  Yes Scot Jun, FNP  vitamin B-12 1000 MCG tablet Take 1 tablet (1,000 mcg total) by mouth daily. 12/21/16  Yes Geradine Girt, DO  predniSONE (DELTASONE) 20 MG tablet Take 2 tablets (40 mg total) by mouth daily with breakfast. Patient not taking: Reported on 02/20/2017 01/15/17   Scot Jun, FNP    Past Medical, Surgical Family and Social History reviewed and updated.    Objective:   Today's Vitals   05/21/17 1059  BP: 132/74  Pulse: 90  Temp: 98 F (36.7 C)  TempSrc: Oral  SpO2: 100%  Weight: 169 lb (76.7 kg)  Height: 5\' 1"  (1.549 m)    Wt Readings from Last 3 Encounters:  05/21/17 169 lb (76.7 kg)  02/20/17 167 lb  (75.8 kg)  01/15/17 161 lb (73 kg)    Physical Exam  Constitutional: She appears well-developed and well-nourished.  HENT:  Head: Normocephalic and atraumatic.  Eyes: Conjunctivae and EOM are normal. Pupils are equal, round, and reactive to light.  Neck: Normal range of motion. Neck supple.  Cardiovascular: Normal rate, regular rhythm, normal heart sounds and intact distal pulses.  Pulmonary/Chest: Effort normal and breath sounds normal.  Musculoskeletal: She exhibits edema.  Trace edema present BLE  Neurological: She is alert. Coordination and gait abnormal.   Assessment & Plan:  1. Muscle pain,  Chronic secondary to chronic neuropathy. Patient has been unable to follow-up with neurology as her disability insurance has not been  approved. -Will order one dose of toradol 30 MG/ML injection IM one dose. Continue Gabapentin and will add methocarbamol 500 mg three times daily for pain.   2. B12 deficiency, continue B-12 orally.   3. Hypertension, unspecified type, stable, well-controlled. We have discussed target BP range and blood pressure goal. I have advised patient to check BP regularly and to call us back or report to clinic if the numbers are consistently higher than 140/90. We discussed the importance of compliance with medical therapy and DASH diet recommended, consequences of uncontrolled hypertension discussed. Continue current BP medications  4. Cerebrovascular accident (CVA), unspecified mechanism ([place]), with residual deficits, of gait and coordination instability. Patient would benefit greatly from physical therapy.    5. Neuropathy, chronic on-going. Patient was previously referred to neurology for further evaluation of persistent symptoms, however due to financial constraints, she's been unable to follow-up. Continue Neurontin 600 mg TID.      Carroll Sage. Kenton Kingfisher, MSN, FNP-C [facility]  [address] [place], [place]  [address] [phone]

## 2017-06-04 ENCOUNTER — Telehealth: Payer: Self-pay

## 2017-06-05 NOTE — Telephone Encounter (Signed)
Patient notified that she should come to office to sign a release of records to get information to [facility]

## 2017-06-21 MED FILL — AMLODIPINE BESYLATE 10 MG T: 10 | 30 days supply | Qty: 30 | Fill #4

## 2017-06-22 MED FILL — NIFEDIPINE ER 30 MG TABLET: 30 | 30 days supply | Qty: 60 | Fill #1

## 2017-07-16 MED FILL — POTASSIUM CL ER 20 MEQ TAB: 20 | 30 days supply | Qty: 30 | Fill #1

## 2017-07-16 MED FILL — GABAPENTIN 300 MG CAPSULE: 300 | 20 days supply | Qty: 120 | Fill #2

## 2017-07-16 MED FILL — METHOCARBAMOL 500 MG TABS: 500 | 30 days supply | Qty: 90 | Fill #1

## 2017-07-16 MED FILL — FUROSEMIDE 20 MG TABLET: 20 | 30 days supply | Qty: 30 | Fill #1

## 2017-07-30 MED FILL — NIFEDIPINE ER 30 MG TABLET: 30 | 30 days supply | Qty: 30 | Fill #1

## 2017-07-30 MED FILL — AMLODIPINE BESYLATE 10 MG T: 10 | 30 days supply | Qty: 30 | Fill #0

## 2017-08-21 ENCOUNTER — Encounter: Payer: Self-pay | Admitting: Family Medicine

## 2017-08-21 ENCOUNTER — Ambulatory Visit (INDEPENDENT_AMBULATORY_CARE_PROVIDER_SITE_OTHER): Payer: Self-pay | Admitting: Family Medicine

## 2017-08-21 VITALS — BP 136/82 | HR 84 | Temp 98.0°F | Ht 61.0 in | Wt 171.6 lb

## 2017-08-21 DIAGNOSIS — Z09 Encounter for follow-up examination after completed treatment for conditions other than malignant neoplasm: Secondary | ICD-10-CM

## 2017-08-21 DIAGNOSIS — R609 Edema, unspecified: Secondary | ICD-10-CM

## 2017-08-21 DIAGNOSIS — Z0000 Encounter for general adult medical examination without abnormal findings: Secondary | ICD-10-CM

## 2017-08-21 DIAGNOSIS — G629 Polyneuropathy, unspecified: Secondary | ICD-10-CM

## 2017-08-21 DIAGNOSIS — R600 Localized edema: Secondary | ICD-10-CM

## 2017-08-21 DIAGNOSIS — G4700 Insomnia, unspecified: Secondary | ICD-10-CM

## 2017-08-21 DIAGNOSIS — I10 Essential (primary) hypertension: Secondary | ICD-10-CM

## 2017-08-21 DIAGNOSIS — Z131 Encounter for screening for diabetes mellitus: Secondary | ICD-10-CM

## 2017-08-21 DIAGNOSIS — R413 Other amnesia: Secondary | ICD-10-CM

## 2017-08-21 LAB — POCT URINALYSIS DIP (MANUAL ENTRY)
Bilirubin, UA: NEGATIVE
Blood, UA: NEGATIVE
Glucose, UA: NEGATIVE mg/dL
Ketones, POC UA: NEGATIVE mg/dL
Leukocytes, UA: NEGATIVE
Nitrite, UA: NEGATIVE
Protein Ur, POC: NEGATIVE mg/dL
Spec Grav, UA: 1.02 (ref 1.010–1.025)
Urobilinogen, UA: 0.2 E.U./dL
pH, UA: 6.5 (ref 5.0–8.0)

## 2017-08-21 LAB — POCT GLYCOSYLATED HEMOGLOBIN (HGB A1C): Hemoglobin A1C: 5.9 % — AB (ref 4.0–5.6)

## 2017-08-21 MED ORDER — GABAPENTIN 300 MG PO CAPS: 600.0000 mg | ORAL_CAPSULE | Freq: Three times a day (TID) | ORAL | 3 refills | Status: DC

## 2017-08-21 MED FILL — GABAPENTIN 300 MG CAPSULE: 300 | 20 days supply | Qty: 120 | Fill #0

## 2017-08-21 NOTE — Progress Notes (Signed)
Subjective:    Patient ID: Brittany [name], female    DOB: December 13, 1965, 51 y.o.   MRN: [id number]   PCP: Brittany Becton, NP  Chief Complaint  Patient presents with  . Follow-up    3 month HTN and chronic condition  . Urinary Retention  . Leg Swelling   HPI  Brittany [name] has a history of Stroke, Neuropathy, Multiple Myeloma, Hypertension, and Irritiable Bowel Syndrome. She is here for follow up.  Current Status: Since her last office visit she has been having problems with urinary retention. She states that she continues to have increased fluid retention also, and feels that Lasix is not helping. She continues to have increased swelling in her legs; left leg > right leg. She denies dysuria, flank pain and hematuria. She arrives today with the assistance of a rolling walker for ambulation. She denies fevers, chills, increased fatigue, and night sweats. Denies headaches, dizziness, visual changes, and falls. Denies abdominal pain, nausea, vomiting, and diarrhea. She has occasional constipation, which she uses Colace for relief.   She continues to have peripheral neuropathy in legs and arms, which she uses Gabapetin.   She feels like she is having some memory loss.   Past Medical History:  Diagnosis Date  . Hiatal hernia   . Hypertension   . IBS (irritable bowel syndrome)   . Multiple myeloma ([place])   . Peripheral neuropathy   . Stroke [facility])     Family History  Problem Relation Age of Onset  . Hypertension Mother   . Sarcoidosis Mother   . Hypertension Father   . Stroke Maternal Uncle    Social History   Socioeconomic History  . Marital status: Legally Separated    Spouse name: Not on file  . Number of children: Not on file  . Years of education: Not on file  . Highest education level: Not on file  Occupational History  . Not on file  Social Needs  . Financial resource strain: Not on file  . Food insecurity:    Worry: Not on file    Inability: Not on file  .  Transportation needs:    Medical: Not on file    Non-medical: Not on file  Tobacco Use  . Smoking status: Former Smoker    Last attempt to quit: 12/06/2015    Years since quitting: 1.7  . Smokeless tobacco: Never Used  Substance and Sexual Activity  . Alcohol use: No  . Drug use: No  . Sexual activity: Not on file  Lifestyle  . Physical activity:    Days per week: Not on file    Minutes per session: Not on file  . Stress: Not on file  Relationships  . Social connections:    Talks on phone: Not on file    Gets together: Not on file    Attends religious service: Not on file    Active member of club or organization: Not on file    Attends meetings of clubs or organizations: Not on file    Relationship status: Not on file  . Intimate partner violence:    Fear of current or ex partner: Not on file    Emotionally abused: Not on file    Physically abused: Not on file    Forced sexual activity: Not on file  Other Topics Concern  . Not on file  Social History Narrative  . Not on file    Past Surgical History:  Procedure Laterality Date  . BONE BIOPSY    .  TEE WITHOUT CARDIOVERSION N/A 12/08/2016   Procedure: TRANSESOPHAGEAL ECHOCARDIOGRAM (TEE);  Surgeon: Sueanne Margarita, MD;  [facility] ENDOSCOPY;  Service: Cardiovascular;  Laterality: N/A;  . TUBAL LIGATION    . WISDOM TOOTH EXTRACTION      There is no immunization history on file for this patient.  Current Meds  Medication Sig  . albuterol (PROVENTIL HFA;VENTOLIN HFA) 108 (90 Base) MCG/ACT inhaler Inhale 2 puffs into the lungs every 4 (four) hours as needed for wheezing or shortness of breath (cough, shortness of breath or wheezing.).  Marland Kitchen amLODipine (NORVASC) 10 MG tablet Take 1 tablet (10 mg total) by mouth daily.  Marland Kitchen aspirin 325 MG tablet Take 1 tablet (325 mg total) by mouth daily.  Marland Kitchen dimenhyDRINATE (DRAMAMINE) 50 MG tablet Take 50 mg by mouth every 8 (eight) hours as needed for dizziness.  . docusate sodium (COLACE)  100 MG capsule Take 200 mg by mouth 2 (two) times daily.   Marland Kitchen gabapentin (NEURONTIN) 300 MG capsule Take 2 capsules (600 mg total) by mouth 3 (three) times daily.  . hydrOXYzine (VISTARIL) 50 MG capsule Take 1-2 capsules (50-100 mg total) by mouth 3 (three) times daily as needed for itching.  . methocarbamol (ROBAXIN) 500 MG tablet Take 1 tablet (500 mg total) by mouth 3 (three) times daily.  . Multiple Vitamin (MULTIVITAMIN) capsule Take 4 capsules by mouth 2 (two) times daily. Centrum plus  . NIFEdipine (PROCARDIA-XL/ADALAT CC) 30 MG 24 hr tablet Take 2 tablets (60 mg total) by mouth daily.  . polyethylene glycol powder (GLYCOLAX/MIRALAX) powder Take 17 g 2 (two) times daily as needed by mouth.  . potassium chloride SA (K-DUR,KLOR-CON) 20 MEQ tablet Take 1 tablet (20 mEq total) by mouth daily as needed. Only if taking lasix.  Marland Kitchen senna (SENOKOT) 8.6 MG TABS tablet Take 2 tablets by mouth 2 (two) times daily.  Marland Kitchen triamcinolone cream (KENALOG) 0.1 % Apply 1 application topically 2 (two) times daily.  . vitamin B-12 1000 MCG tablet Take 1 tablet (1,000 mcg total) by mouth daily.  . [DISCONTINUED] furosemide (LASIX) 20 MG tablet Take 1 tablet (20 mg total) by mouth daily as needed.  . [DISCONTINUED] gabapentin (NEURONTIN) 300 MG capsule Take 2 capsules (600 mg total) by mouth 3 (three) times daily.    Allergies  Allergen Reactions  . Darvon [Propoxyphene] Nausea And Vomiting  . Hydrocodone Nausea And Vomiting  . Lactose Intolerance ([place])   . Other     States can't take pain meds that end in "cet" or meds that end in "ine" Darvocet  . Oxycodone Nausea And Vomiting  . Percocet [Oxycodone-Acetaminophen] Nausea And Vomiting  . Latex Itching and Rash    BP 136/82 (BP Location: Left Arm, Patient Position: Sitting, Cuff Size: Large)   Pulse 84   Temp 98 F (36.7 C) (Oral)   Ht 5' 1"  (1.549 m)   Wt 171 lb 9.6 oz (77.8 kg)   SpO2 98%   BMI 32.42 kg/m    Review of Systems  Constitutional:  Negative.   HENT: Negative.   Eyes: Negative.   Respiratory: Negative.   Cardiovascular: Negative.   Gastrointestinal: Negative.   Endocrine: Negative.   Genitourinary: Negative.   Musculoskeletal: Negative.   Skin: Negative.   Allergic/Immunologic: Negative.   Neurological: Negative.   Hematological: Negative.   Psychiatric/Behavioral: Negative.    Objective:   Physical Exam  Constitutional: She is oriented to person, place, and time. She appears well-developed and well-nourished.  HENT:  Head: Normocephalic.  Right Ear: External ear normal.  Left Ear: External ear normal.  Nose: Nose normal.  Mouth/Throat: Oropharynx is clear and moist.  Eyes: Pupils are equal, round, and reactive to light. Conjunctivae are normal.  Neck: Normal range of motion.  Cardiovascular: Normal rate, regular rhythm, normal heart sounds and intact distal pulses.  Pulmonary/Chest: Effort normal and breath sounds normal.  Abdominal: Soft. Bowel sounds are normal.  Musculoskeletal: Normal range of motion.  Neurological: She is alert and oriented to person, place, and time.  Skin: Skin is warm and dry. Capillary refill takes less than 2 seconds.  Psychiatric: She has a normal mood and affect. Her behavior is normal. Judgment and thought content normal.  Nursing note and vitals reviewed.  Assessment & Plan:   1. Screening for diabetes mellitus She is Pre-diabetic with a Hgb A1c at 5.9 from 5.6 on 01/02/2018. Urinalysis is negative today. She will continue to eat a healthier diet, decreasing high fats, high carbs, and high sugar content, and increasing vegetables, low-fat foods, and increasing water intake. She will also get at least 30 minutes of cardio activity on a daily basis.  - POCT urinalysis dipstick - POCT glycosylated hemoglobin (Hb A1C)  2. Insomnia, unspecified type Improving. Continue to monitor.   3. Health care maintenance - Comprehensive metabolic panel - TSH - Lipid Panel  4.  Hypertension, unspecified type Blood pressure is stable at 136/82 today. Continue Norvasc, Lasix, and Procardia. - CBC with Differential  5. Neuropathy Stable. Not worsening.  - gabapentin (NEURONTIN) 300 MG capsule; Take 2 capsules (600 mg total) by mouth 3 (three) times daily.  Dispense: 120 capsule; Refill: 3  6. Memory changes Mini-Mental Assessment Test performed today. She passed with the maximum score of 30.   7. Peripheral edema 1+ edema in legs. L > R. We will increase Lasix 20 mg to BID as needed.   8. Follow up She will follow up in 1 month.   Meds ordered this encounter  Medications  . gabapentin (NEURONTIN) 300 MG capsule    Sig: Take 2 capsules (600 mg total) by mouth 3 (three) times daily.    Dispense:  120 capsule    Refill:  3  . furosemide (LASIX) 20 MG tablet    Sig: Take 1 tablet (20 mg total) by mouth 2 (two) times daily as needed.    Dispense:  60 tablet    Refill:  [place],  MSN, FNP-BC [facility] [place] [address] [place], [place] [address] [phone]

## 2017-08-22 ENCOUNTER — Telehealth: Payer: Self-pay

## 2017-08-22 LAB — COMPREHENSIVE METABOLIC PANEL
ALT: 12 IU/L (ref 0–32)
AST: 16 IU/L (ref 0–40)
Albumin/Globulin Ratio: 1.7 (ref 1.2–2.2)
Albumin: 4.7 g/dL (ref 3.5–5.5)
Alkaline Phosphatase: 130 IU/L — ABNORMAL HIGH (ref 39–117)
BUN/Creatinine Ratio: 8 — ABNORMAL LOW (ref 9–23)
BUN: 6 mg/dL (ref 6–24)
Bilirubin Total: 0.4 mg/dL (ref 0.0–1.2)
CO2: 20 mmol/L (ref 20–29)
Calcium: 9.9 mg/dL (ref 8.7–10.2)
Chloride: 107 mmol/L — ABNORMAL HIGH (ref 96–106)
Creatinine, Ser: 0.73 mg/dL (ref 0.57–1.00)
GFR calc Af Amer: 110 mL/min/{1.73_m2} (ref >59)
GFR calc non Af Amer: 96 mL/min/{1.73_m2} (ref >59)
Globulin, Total: 2.7 g/dL (ref 1.5–4.5)
Glucose: 96 mg/dL (ref 65–99)
Potassium: 3.9 mmol/L (ref 3.5–5.2)
Sodium: 144 mmol/L (ref 134–144)
Total Protein: 7.4 g/dL (ref 6.0–8.5)

## 2017-08-22 LAB — CBC WITH DIFFERENTIAL/PLATELET
Basophils Absolute: 0 10*3/uL (ref 0.0–0.2)
Basos: 0 %
EOS (ABSOLUTE): 0.1 10*3/uL (ref 0.0–0.4)
Eos: 2 %
Hematocrit: 37.9 % (ref 34.0–46.6)
Hemoglobin: 12.8 g/dL (ref 11.1–15.9)
Immature Grans (Abs): 0 10*3/uL (ref 0.0–0.1)
Immature Granulocytes: 0 %
Lymphocytes Absolute: 2.5 10*3/uL (ref 0.7–3.1)
Lymphs: 47 %
MCH: 27.9 pg (ref 26.6–33.0)
MCHC: 33.8 g/dL (ref 31.5–35.7)
MCV: 83 fL (ref 79–97)
Monocytes Absolute: 0.5 10*3/uL (ref 0.1–0.9)
Monocytes: 10 %
Neutrophils Absolute: 2.2 10*3/uL (ref 1.4–7.0)
Neutrophils: 41 %
Platelets: 259 10*3/uL (ref 150–450)
RBC: 4.59 x10E6/uL (ref 3.77–5.28)
RDW: 14.4 % (ref 12.3–15.4)
WBC: 5.4 10*3/uL (ref 3.4–10.8)

## 2017-08-22 LAB — LIPID PANEL
Chol/HDL Ratio: 4.8 ratio — ABNORMAL HIGH (ref 0.0–4.4)
Cholesterol, Total: 209 mg/dL — ABNORMAL HIGH (ref 100–199)
HDL: 44 mg/dL (ref >39)
LDL Calculated: 138 mg/dL — ABNORMAL HIGH (ref 0–99)
Triglycerides: 133 mg/dL (ref 0–149)
VLDL Cholesterol Cal: 27 mg/dL (ref 5–40)

## 2017-08-22 LAB — TSH: TSH: 3.64 u[IU]/mL (ref 0.450–4.500)

## 2017-08-22 MED ORDER — FUROSEMIDE 20 MG PO TABS: 20.0000 mg | ORAL_TABLET | Freq: Every day | ORAL | 3 refills | Status: DC | PRN

## 2017-08-22 MED FILL — FUROSEMIDE 20 MG TABLET: 20 | 30 days supply | Qty: 30 | Fill #0

## 2017-08-22 NOTE — Telephone Encounter (Signed)
Medication resent

## 2017-08-23 MED FILL — POTASSIUM CL ER 20 MEQ TAB: 20 | 30 days supply | Qty: 30 | Fill #2

## 2017-08-24 MED ORDER — FUROSEMIDE 20 MG PO TABS: 20.0000 mg | ORAL_TABLET | Freq: Two times a day (BID) | ORAL | 1 refills | Status: DC | PRN

## 2017-08-27 NOTE — Telephone Encounter (Signed)
Patient states that her blood pressure stable when she was taking 2 tablets of the Nifedipine and now since she is only doing one her readings have been in the 180 to 160 range. Please advise?

## 2017-08-30 MED FILL — NIFEDIPINE ER 30 MG TABLET: 30 | 30 days supply | Qty: 30 | Fill #2

## 2017-08-30 MED FILL — AMLODIPINE BESYLATE 10 MG T: 10 | 30 days supply | Qty: 30 | Fill #1

## 2017-09-10 NOTE — Telephone Encounter (Signed)
-----   Message from Brittany [name], [place] sent at 09/06/2017  3:42 PM EDT ----- Regarding: "Blood Pressure Medication" [name],  Please inform patient that she is prescribed Procardia 30 mg, 2 tablets daily she is taking it correctly. She should continue 2 tablets daily as prescribed to continue better blood pressure management. We will discuss at next office visit.  Thanks!

## 2017-09-10 NOTE — Telephone Encounter (Signed)
Patient states that she was only taking it once a day and will start doing it twice a day.

## 2017-09-24 ENCOUNTER — Ambulatory Visit: Payer: Medicaid Other | Admitting: Family Medicine

## 2017-09-24 MED FILL — NIFEDIPINE ER 30 MG TABLET: 30 | 30 days supply | Qty: 30 | Fill #3

## 2017-09-24 MED FILL — POTASSIUM CL ER 20 MEQ TAB: 20 | 30 days supply | Qty: 30 | Fill #3

## 2017-09-24 MED FILL — FUROSEMIDE 20 MG TABLET: 20 | 30 days supply | Qty: 30 | Fill #1

## 2017-09-24 MED FILL — GABAPENTIN 300 MG CAPSULE: 300 | 20 days supply | Qty: 120 | Fill #1

## 2017-09-24 MED FILL — AMLODIPINE BESYLATE 10 MG T: 10 | 30 days supply | Qty: 30 | Fill #2

## 2017-09-26 ENCOUNTER — Ambulatory Visit: Payer: Medicaid Other | Admitting: Family Medicine

## 2017-09-28 ENCOUNTER — Encounter: Payer: Self-pay | Admitting: Family Medicine

## 2017-09-28 ENCOUNTER — Ambulatory Visit (INDEPENDENT_AMBULATORY_CARE_PROVIDER_SITE_OTHER): Payer: Self-pay | Admitting: Family Medicine

## 2017-09-28 VITALS — BP 136/84 | HR 84 | Temp 97.6°F | Ht 61.0 in | Wt 178.0 lb

## 2017-09-28 DIAGNOSIS — R609 Edema, unspecified: Secondary | ICD-10-CM

## 2017-09-28 DIAGNOSIS — G4700 Insomnia, unspecified: Secondary | ICD-10-CM

## 2017-09-28 DIAGNOSIS — I10 Essential (primary) hypertension: Secondary | ICD-10-CM

## 2017-09-28 DIAGNOSIS — Z09 Encounter for follow-up examination after completed treatment for conditions other than malignant neoplasm: Secondary | ICD-10-CM

## 2017-09-28 DIAGNOSIS — G629 Polyneuropathy, unspecified: Secondary | ICD-10-CM

## 2017-09-28 DIAGNOSIS — R600 Localized edema: Secondary | ICD-10-CM

## 2017-09-28 LAB — POCT URINALYSIS DIP (MANUAL ENTRY)
Bilirubin, UA: NEGATIVE
Blood, UA: NEGATIVE
Glucose, UA: NEGATIVE mg/dL
Ketones, POC UA: NEGATIVE mg/dL
Leukocytes, UA: NEGATIVE
Nitrite, UA: NEGATIVE
Protein Ur, POC: NEGATIVE mg/dL
Spec Grav, UA: 1.02 (ref 1.010–1.025)
Urobilinogen, UA: 0.2 E.U./dL
pH, UA: 6.5 (ref 5.0–8.0)

## 2017-09-28 MED ORDER — FUROSEMIDE 20 MG PO TABS: 20.0000 mg | ORAL_TABLET | Freq: Two times a day (BID) | ORAL | 2 refills | Status: DC | PRN

## 2017-09-28 NOTE — Progress Notes (Signed)
Subjective:    Patient ID: Brittany [name], female    DOB: 24-Dec-1965, 52 y.o.   MRN: [id number]   PCP: Brittany Becton, NP  Chief Complaint  Patient presents with  . Follow-up    chronic condition   HPI  Brittany [name] has a history of Stroke, Neuropathy, Multiple Myeloma, Hypertension, and Irritiable Bowel Syndrome. She is here for follow up.  Current Status: Since her last office visit she has been having problems with urinary retention.   She states that she continues to have increased fluid retention also, and feels that Lasix is not helping. She continues to have increased swelling in her legs; left leg > right leg. She denies dysuria, flank pain and hematuria. She arrives today with the assistance of a rolling walker for ambulation.   She denies fevers, chills, increased fatigue, and night sweats. Denies headaches, dizziness, visual changes, and falls.   Mild left lower quadrant abdominal pain, nausea, vomiting, and diarrhea. She has occasional constipation, which she uses Colace for relief.   She continues to have peripheral neuropathy in legs and arms, which she uses Gabapetin.   She states that her insomnia is improving.   Past Medical History:  Diagnosis Date  . Hiatal hernia   . Hypertension   . IBS (irritable bowel syndrome)   . Multiple myeloma ([place])   . Peripheral neuropathy   . Stroke [facility])     Family History  Problem Relation Age of Onset  . Hypertension Mother   . Sarcoidosis Mother   . Hypertension Father   . Stroke Maternal Uncle    Social History   Socioeconomic History  . Marital status: Legally Separated    Spouse name: Not on file  . Number of children: Not on file  . Years of education: Not on file  . Highest education level: Not on file  Occupational History  . Not on file  Social Needs  . Financial resource strain: Not on file  . Food insecurity:    Worry: Not on file    Inability: Not on file  . Transportation needs:    Medical:  Not on file    Non-medical: Not on file  Tobacco Use  . Smoking status: Former Smoker    Last attempt to quit: 12/06/2015    Years since quitting: 1.8  . Smokeless tobacco: Never Used  Substance and Sexual Activity  . Alcohol use: No  . Drug use: No  . Sexual activity: Not on file  Lifestyle  . Physical activity:    Days per week: Not on file    Minutes per session: Not on file  . Stress: Not on file  Relationships  . Social connections:    Talks on phone: Not on file    Gets together: Not on file    Attends religious service: Not on file    Active member of club or organization: Not on file    Attends meetings of clubs or organizations: Not on file    Relationship status: Not on file  . Intimate partner violence:    Fear of current or ex partner: Not on file    Emotionally abused: Not on file    Physically abused: Not on file    Forced sexual activity: Not on file  Other Topics Concern  . Not on file  Social History Narrative  . Not on file    Past Surgical History:  Procedure Laterality Date  . BONE BIOPSY    . TEE  WITHOUT CARDIOVERSION N/A 12/08/2016   Procedure: TRANSESOPHAGEAL ECHOCARDIOGRAM (TEE);  Surgeon: Sueanne Margarita, MD;  Location: [facility] ENDOSCOPY;  Service: Cardiovascular;  Laterality: N/A;  . TUBAL LIGATION    . WISDOM TOOTH EXTRACTION      There is no immunization history on file for this patient.  Current Meds  Medication Sig  . albuterol (PROVENTIL HFA;VENTOLIN HFA) 108 (90 Base) MCG/ACT inhaler Inhale 2 puffs into the lungs every 4 (four) hours as needed for wheezing or shortness of breath (cough, shortness of breath or wheezing.).  Marland Kitchen amLODipine (NORVASC) 10 MG tablet Take 1 tablet (10 mg total) by mouth daily.  Marland Kitchen aspirin 325 MG tablet Take 1 tablet (325 mg total) by mouth daily.  Marland Kitchen dimenhyDRINATE (DRAMAMINE) 50 MG tablet Take 50 mg by mouth every 8 (eight) hours as needed for dizziness.  . docusate sodium (COLACE) 100 MG capsule Take 200 mg by mouth  2 (two) times daily.   . furosemide (LASIX) 20 MG tablet Take 1 tablet (20 mg total) by mouth 2 (two) times daily as needed.  . gabapentin (NEURONTIN) 300 MG capsule Take 2 capsules (600 mg total) by mouth 3 (three) times daily.  . hydrOXYzine (VISTARIL) 50 MG capsule Take 1-2 capsules (50-100 mg total) by mouth 3 (three) times daily as needed for itching.  . methocarbamol (ROBAXIN) 500 MG tablet Take 1 tablet (500 mg total) by mouth 3 (three) times daily.  . Multiple Vitamin (MULTIVITAMIN) capsule Take 4 capsules by mouth 2 (two) times daily. Centrum plus  . NIFEdipine (PROCARDIA-XL/ADALAT CC) 30 MG 24 hr tablet Take 2 tablets (60 mg total) by mouth daily.  . polyethylene glycol powder (GLYCOLAX/MIRALAX) powder Take 17 g 2 (two) times daily as needed by mouth.  . potassium chloride SA (K-DUR,KLOR-CON) 20 MEQ tablet Take 1 tablet (20 mEq total) by mouth daily as needed. Only if taking lasix.  Marland Kitchen senna (SENOKOT) 8.6 MG TABS tablet Take 2 tablets by mouth 2 (two) times daily.  Marland Kitchen triamcinolone cream (KENALOG) 0.1 % Apply 1 application topically 2 (two) times daily.  . [DISCONTINUED] furosemide (LASIX) 20 MG tablet Take 1 tablet (20 mg total) by mouth 2 (two) times daily as needed.    Allergies  Allergen Reactions  . Darvon [Propoxyphene] Nausea And Vomiting  . Hydrocodone Nausea And Vomiting  . Lactose Intolerance (Gi)   . Other     States can't take pain meds that end in "cet" or meds that end in "ine" Darvocet  . Oxycodone Nausea And Vomiting  . Percocet [Oxycodone-Acetaminophen] Nausea And Vomiting  . Latex Itching and Rash    BP 136/84 (BP Location: Left Arm, Patient Position: Sitting, Cuff Size: Small)   Pulse 84   Temp 97.6 F (36.4 C) (Oral)   Ht '5\' 1"'$  (1.549 m)   Wt 178 lb (80.7 kg)   SpO2 100%   BMI 33.63 kg/m   Review of Systems  Constitutional: Negative.   HENT: Negative.   Eyes: Negative.   Respiratory: Negative.   Cardiovascular: Negative.   Gastrointestinal:  Negative.   Endocrine: Negative.   Genitourinary: Negative.   Musculoskeletal: Negative.   Skin: Negative.   Allergic/Immunologic: Negative.   Neurological: Negative.   Hematological: Negative.   Psychiatric/Behavioral: Negative.    Objective:   Physical Exam  Constitutional: She is oriented to person, place, and time. She appears well-developed and well-nourished.  HENT:  Head: Normocephalic.  Right Ear: External ear normal.  Left Ear: External ear normal.  Nose: Nose normal.  Mouth/Throat: Oropharynx is clear and moist.  Eyes: Pupils are equal, round, and reactive to light. Conjunctivae are normal.  Neck: Normal range of motion.  Cardiovascular: Normal rate, regular rhythm, normal heart sounds and intact distal pulses.  Pulmonary/Chest: Effort normal and breath sounds normal.  Abdominal: Soft. Bowel sounds are normal.  Musculoskeletal: Normal range of motion.  Neurological: She is alert and oriented to person, place, and time.  Skin: Skin is warm and dry. Capillary refill takes less than 2 seconds.  Psychiatric: She has a normal mood and affect. Her behavior is normal. Judgment and thought content normal.  Nursing note and vitals reviewed.  Assessment & Plan:   1. Insomnia, unspecified type Improving. Continue to monitor.   2. Hypertension, unspecified type Blood pressure is stable at 136/84 today. Continue Norvasc, Lasix, and Procardia.  3. Neuropathy Stable. Not worsening.  - gabapentin (NEURONTIN) 300 MG capsule; Take 2 capsules (600 mg total) by mouth 3 (three) times daily.  Dispense: 120 capsule; Refill: 3  4. Peripheral edema 1+ edema in legs. L > R.  She states that Lasix is effective. We will increase Lasix 20 mg to BID as needed.   5. Follow up She will follow up in 3 months.   Meds ordered this encounter  Medications  . furosemide (LASIX) 20 MG tablet    Sig: Take 1 tablet (20 mg total) by mouth 2 (two) times daily as needed.    Dispense:  60 tablet     Refill:  2    Brittany Becton,  MSN, FNP-BC [facility] [address] [place], [place] [address] [phone]

## 2017-10-17 MED FILL — FUROSEMIDE 20 MG TABLET: 20 | 30 days supply | Qty: 60 | Fill #0

## 2017-10-24 ENCOUNTER — Other Ambulatory Visit: Payer: Self-pay

## 2017-10-24 MED ORDER — POTASSIUM CHLORIDE CRYS ER 20 MEQ PO TBCR: 20.0000 meq | EXTENDED_RELEASE_TABLET | Freq: Every day | ORAL | 3 refills | Status: DC | PRN

## 2017-10-24 MED ORDER — HYDROXYZINE PAMOATE 50 MG PO CAPS: 50.0000 mg | ORAL_CAPSULE | Freq: Three times a day (TID) | ORAL | 0 refills | Status: DC | PRN

## 2017-10-24 MED FILL — POTASSIUM CL ER 20 MEQ TAB: 20 | 30 days supply | Qty: 30 | Fill #0

## 2017-10-24 MED FILL — HYDROXYZINE PAM 50 MG CAP: 50 | 5 days supply | Qty: 30 | Fill #0

## 2017-10-24 NOTE — Telephone Encounter (Signed)
Medication refilled

## 2017-10-29 ENCOUNTER — Telehealth: Payer: Self-pay

## 2017-10-29 MED ORDER — NIFEDIPINE ER 30 MG PO TB24: 60.0000 mg | ORAL_TABLET | Freq: Every day | ORAL | 1 refills | Status: DC

## 2017-10-29 MED FILL — NIFEDIPINE ER 30 MG TABLET: 30 | 30 days supply | Qty: 60 | Fill #0

## 2017-10-29 NOTE — Telephone Encounter (Signed)
Medication sent to pharmacy  

## 2017-11-02 MED FILL — METHOCARBAMOL 500 MG TABS: 500 | 30 days supply | Qty: 90 | Fill #2

## 2017-11-02 MED FILL — GABAPENTIN 300 MG CAPSULE: 300 | 20 days supply | Qty: 120 | Fill #2

## 2017-11-02 MED FILL — AMLODIPINE BESYLATE 10 MG T: 10 | 30 days supply | Qty: 30 | Fill #3

## 2017-12-03 ENCOUNTER — Other Ambulatory Visit: Payer: Self-pay

## 2017-12-03 MED ORDER — AMLODIPINE BESYLATE 10 MG PO TABS: 10.0000 mg | ORAL_TABLET | Freq: Every day | ORAL | 3 refills | Status: DC

## 2017-12-03 MED FILL — NIFEDIPINE ER 30 MG TABLET: 30 | 30 days supply | Qty: 60 | Fill #1

## 2017-12-03 MED FILL — AMLODIPINE BESYLATE 10 MG T: 10 | 30 days supply | Qty: 30 | Fill #0

## 2017-12-03 MED FILL — FUROSEMIDE 20 MG TABLET: 20 | 30 days supply | Qty: 60 | Fill #1

## 2017-12-03 MED FILL — POTASSIUM CL ER 20 MEQ TAB: 20 | 30 days supply | Qty: 30 | Fill #1

## 2017-12-03 NOTE — Telephone Encounter (Signed)
Medication resent

## 2017-12-18 MED FILL — GABAPENTIN 300 MG CAPSULE: 300 | 20 days supply | Qty: 120 | Fill #3

## 2017-12-18 MED FILL — PROAIR HFA 90 MCG INHALER: 108 (90 BAS | 16 days supply | Qty: 9 | Fill #1

## 2017-12-27 ENCOUNTER — Telehealth: Payer: Self-pay

## 2017-12-27 NOTE — Telephone Encounter (Signed)
Left a vm letting patient no that her appointment is 10/14 at 11am and to give [place] a call if she is unable to make it.

## 2017-12-31 ENCOUNTER — Encounter: Payer: Self-pay | Admitting: Family Medicine

## 2017-12-31 ENCOUNTER — Ambulatory Visit (INDEPENDENT_AMBULATORY_CARE_PROVIDER_SITE_OTHER): Payer: Medicaid Other | Admitting: Family Medicine

## 2017-12-31 VITALS — BP 144/86 | HR 84 | Temp 98.4°F | Ht 61.0 in | Wt 177.6 lb

## 2017-12-31 DIAGNOSIS — M25532 Pain in left wrist: Secondary | ICD-10-CM

## 2017-12-31 DIAGNOSIS — I639 Cerebral infarction, unspecified: Secondary | ICD-10-CM | POA: Diagnosis not present

## 2017-12-31 DIAGNOSIS — M25551 Pain in right hip: Secondary | ICD-10-CM | POA: Diagnosis not present

## 2017-12-31 DIAGNOSIS — Z09 Encounter for follow-up examination after completed treatment for conditions other than malignant neoplasm: Secondary | ICD-10-CM | POA: Diagnosis not present

## 2017-12-31 DIAGNOSIS — K219 Gastro-esophageal reflux disease without esophagitis: Secondary | ICD-10-CM

## 2017-12-31 DIAGNOSIS — R609 Edema, unspecified: Secondary | ICD-10-CM

## 2017-12-31 DIAGNOSIS — G629 Polyneuropathy, unspecified: Secondary | ICD-10-CM

## 2017-12-31 DIAGNOSIS — Z131 Encounter for screening for diabetes mellitus: Secondary | ICD-10-CM

## 2017-12-31 DIAGNOSIS — G4700 Insomnia, unspecified: Secondary | ICD-10-CM | POA: Diagnosis not present

## 2017-12-31 DIAGNOSIS — M25552 Pain in left hip: Secondary | ICD-10-CM

## 2017-12-31 DIAGNOSIS — R600 Localized edema: Secondary | ICD-10-CM

## 2017-12-31 DIAGNOSIS — I10 Essential (primary) hypertension: Secondary | ICD-10-CM

## 2017-12-31 DIAGNOSIS — N329 Bladder disorder, unspecified: Secondary | ICD-10-CM | POA: Diagnosis not present

## 2017-12-31 LAB — POCT URINALYSIS DIP (MANUAL ENTRY)
Bilirubin, UA: NEGATIVE
Blood, UA: NEGATIVE
Glucose, UA: NEGATIVE mg/dL
Ketones, POC UA: NEGATIVE mg/dL
Leukocytes, UA: NEGATIVE
Nitrite, UA: NEGATIVE
Protein Ur, POC: NEGATIVE mg/dL
Spec Grav, UA: 1.015 (ref 1.010–1.025)
Urobilinogen, UA: 0.2 E.U./dL
pH, UA: 5.5 (ref 5.0–8.0)

## 2017-12-31 LAB — POCT GLYCOSYLATED HEMOGLOBIN (HGB A1C): Hemoglobin A1C: 5.8 % — AB (ref 4.0–5.6)

## 2017-12-31 MED ORDER — NAPROXEN 500 MG PO TABS: 500.0000 mg | ORAL_TABLET | Freq: Two times a day (BID) | ORAL | 2 refills | Status: DC

## 2017-12-31 MED ORDER — OMEPRAZOLE 20 MG PO CPDR: 20.0000 mg | DELAYED_RELEASE_CAPSULE | Freq: Every day | ORAL | 2 refills | Status: DC

## 2017-12-31 MED FILL — NAPROXEN 500 MG TABLET: 500 | 30 days supply | Qty: 60 | Fill #0 | Status: TO

## 2017-12-31 MED FILL — OMEPRAZOLE 20 MG CAP: 20 | 30 days supply | Qty: 30 | Fill #0

## 2017-12-31 NOTE — Progress Notes (Signed)
Follow Up  Subjective:    Patient ID: Brittany [name], female    DOB: 1965/12/22, 52 y.o.   MRN: [id number]   Chief Complaint  Patient presents with  . Follow-up    chronic condition  . Edema    arms and legs  . Irritable Bowel Syndrome   HPI  Brittany [name] is a 52 year old female with a past medical history of Stroke, Peripheral Neuropathy, MM, IBS, Hypertension, Prolapsed Bladder, and Hiatal Hernia. She is here today for follow up.  Current Status: Since her last office visit she continues to have problems with urinary retention, because of prolapsed bladder. She states that she continues to have increased fluid retention also, and feels that Lasix is not helping. She continues to have increased swelling in her legs; left leg > right leg. She has began to have increased swelling in her left arm. She states that swelling causes pain around her ankle and heels when she ambulates.   She denies dysuria, flank pain and hematuria. She arrives today with the assistance of a rolling walker for ambulation. She reports shortness of breath on exertion. She denies visual changes, chest pain, cough, heart palpitations, and falls. She has occasionally headaches and dizziness with position changes.   She does have chronic constipation, which she takes several medications with minimal relief. She has occasional nausea r/t GERD. No reports of any other GI problems such as vomiting, and diarrhea. Denies severe headaches, confusion, seizures, double vision, and blurred vision, and vomiting. She denies suicidal ideations, homicidal ideations, or auditory hallucinations. She has mild pain in her left wrist.   Her anxiety is improving with no place to live.  She denies fevers, chills, fatigue, recent infections, weight loss, and night sweats. She has not had any headaches, visual changes, and falls. She has no reports of blood in stools, dysuria and hematuria.   Past Medical History:  Diagnosis Date  . Hiatal  hernia   . Hypertension   . IBS (irritable bowel syndrome)   . Multiple myeloma ([place])   . Peripheral neuropathy   . Stroke [facility])     Family History  Problem Relation Age of Onset  . Hypertension Mother   . Sarcoidosis Mother   . Hypertension Father   . Stroke Maternal Uncle     Social History   Socioeconomic History  . Marital status: Legally Separated    Spouse name: Not on file  . Number of children: Not on file  . Years of education: Not on file  . Highest education level: Not on file  Occupational History  . Not on file  Social Needs  . Financial resource strain: Not on file  . Food insecurity:    Worry: Not on file    Inability: Not on file  . Transportation needs:    Medical: Not on file    Non-medical: Not on file  Tobacco Use  . Smoking status: Former Smoker    Last attempt to quit: 12/06/2015    Years since quitting: 2.0  . Smokeless tobacco: Never Used  Substance and Sexual Activity  . Alcohol use: No  . Drug use: No  . Sexual activity: Not on file  Lifestyle  . Physical activity:    Days per week: Not on file    Minutes per session: Not on file  . Stress: Not on file  Relationships  . Social connections:    Talks on phone: Not on file    Gets together: Not on  file    Attends religious service: Not on file    Active member of club or organization: Not on file    Attends meetings of clubs or organizations: Not on file    Relationship status: Not on file  . Intimate partner violence:    Fear of current or ex partner: Not on file    Emotionally abused: Not on file    Physically abused: Not on file    Forced sexual activity: Not on file  Other Topics Concern  . Not on file  Social History Narrative  . Not on file    Past Surgical History:  Procedure Laterality Date  . BONE BIOPSY    . TEE WITHOUT CARDIOVERSION N/A 12/08/2016   Procedure: TRANSESOPHAGEAL ECHOCARDIOGRAM (TEE);  Surgeon: Sueanne Margarita, MD;  Location: [facility] ENDOSCOPY;  Service:  Cardiovascular;  Laterality: N/A;  . TUBAL LIGATION    . WISDOM TOOTH EXTRACTION       There is no immunization history on file for this patient.   Current Meds  Medication Sig  . albuterol (PROVENTIL HFA;VENTOLIN HFA) 108 (90 Base) MCG/ACT inhaler Inhale 2 puffs into the lungs every 4 (four) hours as needed for wheezing or shortness of breath (cough, shortness of breath or wheezing.).  Marland Kitchen amLODipine (NORVASC) 10 MG tablet Take 1 tablet (10 mg total) by mouth daily.  Marland Kitchen aspirin 325 MG tablet Take 1 tablet (325 mg total) by mouth daily.  Marland Kitchen dimenhyDRINATE (DRAMAMINE) 50 MG tablet Take 50 mg by mouth every 8 (eight) hours as needed for dizziness.  . docusate sodium (COLACE) 100 MG capsule Take 200 mg by mouth 2 (two) times daily.   . furosemide (LASIX) 20 MG tablet Take 1 tablet (20 mg total) by mouth 2 (two) times daily as needed.  . gabapentin (NEURONTIN) 300 MG capsule Take 2 capsules (600 mg total) by mouth 3 (three) times daily.  . hydrOXYzine (VISTARIL) 50 MG capsule Take 1-2 capsules (50-100 mg total) by mouth 3 (three) times daily as needed for itching.  . methocarbamol (ROBAXIN) 500 MG tablet Take 1 tablet (500 mg total) by mouth 3 (three) times daily.  . Multiple Vitamin (MULTIVITAMIN) capsule Take 4 capsules by mouth 2 (two) times daily. Centrum plus  . NIFEdipine (PROCARDIA-XL/ADALAT CC) 30 MG 24 hr tablet Take 2 tablets (60 mg total) by mouth daily.  . potassium chloride SA (K-DUR,KLOR-CON) 20 MEQ tablet Take 1 tablet (20 mEq total) by mouth daily as needed. Only if taking lasix.  Marland Kitchen senna (SENOKOT) 8.6 MG TABS tablet Take 2 tablets by mouth 2 (two) times daily.  Marland Kitchen triamcinolone cream (KENALOG) 0.1 % Apply 1 application topically 2 (two) times daily.    Allergies  Allergen Reactions  . Darvon [Propoxyphene] Nausea And Vomiting  . Hydrocodone Nausea And Vomiting  . Lactose Intolerance (Gi)   . Other     States can't take pain meds that end in "cet" or meds that end in  "ine" Darvocet  . Oxycodone Nausea And Vomiting  . Percocet [Oxycodone-Acetaminophen] Nausea And Vomiting  . Latex Itching and Rash   BP (!) 144/86 (BP Location: Left Arm, Patient Position: Sitting, Cuff Size: Large)   Pulse 84   Temp 98.4 F (36.9 C) (Oral)   Ht _0  (1.549 m)   Wt 177 lb 9.6 oz (80.6 kg)   SpO2 100%   BMI 33.56 kg/m    Review of Systems  Constitutional: Negative.   Respiratory: Negative.   Cardiovascular: Negative.  Gastrointestinal: Positive for abdominal distention (Obese).  Genitourinary: Positive for difficulty urinating.  Musculoskeletal: Positive for arthralgias (Generalized).  Skin: Negative.   Neurological: Positive for dizziness and headaches.  Psychiatric/Behavioral: Negative.    Objective:   Physical Exam  Constitutional: She is oriented to person, place, and time. She appears well-developed and well-nourished.  HENT:  Head: Normocephalic and atraumatic.  Neck: Normal range of motion. Neck supple.  Cardiovascular: Normal rate, regular rhythm, normal heart sounds and intact distal pulses.  Pulmonary/Chest: Effort normal and breath sounds normal.  Abdominal: Soft. Bowel sounds are normal.  Musculoskeletal: She exhibits edema and tenderness.  Left arm/wrist pain  Neurological: She is alert and oriented to person, place, and time.  Skin: Skin is warm and dry.  Psychiatric: She has a normal mood and affect. Her behavior is normal. Judgment and thought content normal.  Nursing note and vitals reviewed.  Assessment & Plan:   1. Hypertension, unspecified type Blood pressure is stable at 144/86 today. Continue Nifedipine and Amlodipine as prescribed. She will continue to decrease high sodium intake, excessive alcohol intake, increase potassium intake, smoking cessation, and increase physical activity of at least 30 minutes of cardio activity daily. She will continue to follow Heart Healthy or DASH diet.  2. Left wrist pain We will initiate  Naproxen today.   3. Neuropathy Stable. Continue Gabapentin as prescribed.   4. Cerebrovascular accident (CVA), unspecified mechanism ([place]) - Ambulatory referral to Neurology  5. Peripheral edema Continue Lasix as prescribed.  6. Insomnia, unspecified type Stable.  7. Bilateral hip pain We will initiate Naproxen today. - naproxen (NAPROSYN) 500 MG tablet; Take 1 tablet (500 mg total) by mouth 2 (two) times daily with a meal.  Dispense: 60 tablet; Refill: 2  8. Gastroesophageal reflux disease without esophagitis We will initiate Prilosec today.  - omeprazole (PRILOSEC) 20 MG capsule; Take 1 capsule (20 mg total) by mouth daily.  Dispense: 30 capsule; Refill: 2  9. Urinary bladder disorder She has history of prolapsed bladder.  - Ambulatory referral to Urology  10. Screening for diabetes mellitus Hgb A1c  Mildly decreased at 5.8 today, from 5.9 on 08/21/2017. She will continue to decrease foods/beverages high in sugars and carbs and follow Heart Healthy or DASH diet. Increase physical activity to at least 30 minutes cardio exercise daily.  - POCT glycosylated hemoglobin (Hb A1C) - POCT urinalysis dipstick  11. Follow up She will follow up in 3 months.   Meds ordered this encounter  Medications  . omeprazole (PRILOSEC) 20 MG capsule    Sig: Take 1 capsule (20 mg total) by mouth daily.    Dispense:  30 capsule    Refill:  2  . naproxen (NAPROSYN) 500 MG tablet    Sig: Take 1 tablet (500 mg total) by mouth 2 (two) times daily with a meal.    Dispense:  60 tablet    Refill:  2    Kathe Becton,  MSN, FNP-C [facility] [address] [place], [place] [address] [phone]

## 2017-12-31 NOTE — Patient Instructions (Signed)
Omeprazole capsules (sprinkle caps) - Rx What is this medicine? OMEPRAZOLE (oh ME pray zol) prevents the production of acid in the stomach. It is used to treat gastroesophageal reflux disease (GERD), ulcers, certain bacteria in the stomach, inflammation of the esophagus, and Zollinger-Ellison Syndrome. It is also used to treat other conditions that cause too much stomach acid. This medicine may be used for other purposes; ask your health care provider or pharmacist if you have questions. COMMON BRAND NAME(S): Prilosec What should I tell my health care provider before I take this medicine? They need to know if you have any of these conditions: -liver disease -low levels of magnesium in the blood -lupus -an unusual or allergic reaction to omeprazole, other medicines, foods, dyes, or preservatives -pregnant or trying to get pregnant -breast-feeding How should I use this medicine? Take this medicine by mouth with a glass of water. Follow the directions on the prescription label. Do not crush, break or chew the capsules. They can be opened and the contents sprinkled on a small amount of applesauce or yogurt, given with fruit juices, or swallowed immediately with water. This medicine works best if taken on an empty stomach 30 to 60 minutes before breakfast. Take your doses at regular intervals. Do not take your medicine more often than directed. Talk to your pediatrician regarding the use of this medicine in children. Special care may be needed. Overdosage: If you think you have taken too much of this medicine contact a poison control center or emergency room at once. NOTE: This medicine is only for you. Do not share this medicine with others. What if I miss a dose? If you miss a dose, take it as soon as you can. If it is almost time for your next dose, take only that dose. Do not take double or extra doses. What may interact with this medicine? Do not take this medicine with any of the following  medications: -atazanavir -clopidogrel -nelfinavir This medicine may also interact with the following medications: -ampicillin -certain medicines for anxiety or sleep -certain medicines that treat or prevent blood clots like warfarin -cyclosporine -diazepam -digoxin -disulfiram -diuretics -iron salts -methotrexate -mycophenolate mofetil -phenytoin -prescription medicine for fungal or yeast infection like itraconazole, ketoconazole, voriconazole -saquinavir -tacrolimus This list may not describe all possible interactions. Give your health care provider a list of all the medicines, herbs, non-prescription drugs, or dietary supplements you use. Also tell them if you smoke, drink alcohol, or use illegal drugs. Some items may interact with your medicine. What should I watch for while using this medicine? It can take several days before your stomach pain gets better. Check with your doctor or health care professional if your condition does not start to get better, or if it gets worse. You may need blood work done while you are taking this medicine. What side effects may I notice from receiving this medicine? Side effects that you should report to your doctor or health care professional as soon as possible: -allergic reactions like skin rash, itching or hives, swelling of the face, lips, or tongue -bone, muscle or joint pain -breathing problems -chest pain or chest tightness -dark yellow or brown urine -dizziness -fast, irregular heartbeat -feeling faint or lightheaded -fever or sore throat -muscle spasm -palpitations -rash on cheeks or arms that gets worse in the sun -redness, blistering, peeling or loosening of the skin, including inside the mouth -seizures -tremors -unusual bleeding or bruising -unusually weak or tired -yellowing of the eyes or skin Side effects  that usually do not require medical attention (report to your doctor or health care professional if they continue or  are bothersome): -constipation -diarrhea -dry mouth -headache -nausea This list may not describe all possible side effects. Call your doctor for medical advice about side effects. You may report side effects to FDA at 1-800-FDA-1088. Where should I keep my medicine? Keep out of the reach of children. Store at room temperature between 15 and 30 degrees C (59 and 86 degrees F). Protect from light and moisture. Throw away any unused medicine after the expiration date. NOTE: This sheet is a summary. It may not cover all possible information. If you have questions about this medicine, talk to your doctor, pharmacist, or health care provider.  2018 Elsevier/Gold Standard (2015-04-08 12:18:47) Naproxen Sodium oral tablet, extended-release What is this medicine? NAPROXEN (na PROX en) is a non-steroidal anti-inflammatory drug (NSAID). It is used to reduce swelling and to treat pain. This medicine may be used for dental pain, headache, or painful monthly periods. It is also used for painful joint and muscular problems such as arthritis, tendinitis, bursitis, and gout. This medicine may be used for other purposes; ask your health care provider or pharmacist if you have questions. COMMON BRAND NAME(S): Midol Extended Relief, Naprelan Dose Card What should I tell my health care provider before I take this medicine? They need to know if you have any of these conditions: -asthma -cigarette smoker -drink more than 3 alcohol containing drinks a day -heart disease or circulation problems such as heart failure or leg edema (fluid retention) -high blood pressure -kidney disease -liver disease -stomach bleeding or ulcers -an unusual or allergic reaction to naproxen, aspirin, other NSAIDs, other medicines, foods, dyes, or preservatives -pregnant or trying to get pregnant -breast-feeding How should I use this medicine? Take this medicine by mouth with a glass of water. Follow the directions on the  prescription label. Take this medicine with food if it upsets your stomach. Try to not lie down for at least 10 minutes after you take it. Take your medicine at regular intervals. Do not take your medicine more often than directed. Long-term, continuous use may increase the risk of heart attack or stroke. A special MedGuide will be given to you by the pharmacist with each prescription and refill. Be sure to read this information carefully each time. Talk to your pediatrician regarding the use of this medicine in children. Special care may be needed. Overdosage: If you think you have taken too much of this medicine contact a poison control center or emergency room at once. NOTE: This medicine is only for you. Do not share this medicine with others. What if I miss a dose? If you miss a dose, take it as soon as you can. If it is almost time for your next dose, take only that dose. Do not take double or extra doses. What may interact with this medicine? -alcohol -aspirin -cidofovir -diuretics -lithium -methotrexate -other drugs for inflammation like ketorolac or prednisone -pemetrexed -probenecid -warfarin This list may not describe all possible interactions. Give your health care provider a list of all the medicines, herbs, non-prescription drugs, or dietary supplements you use. Also tell them if you smoke, drink alcohol, or use illegal drugs. Some items may interact with your medicine. What should I watch for while using this medicine? Tell your doctor or health care professional if your pain does not get better. Talk to your doctor before taking another medicine for pain. Do not treat yourself.  This medicine does not prevent heart attack or stroke. In fact, this medicine may increase the chance of a heart attack or stroke. The chance may increase with longer use of this medicine and in people who have heart disease. If you take aspirin to prevent heart attack or stroke, talk with your doctor or  health care professional. Do not take other medicines that contain aspirin, ibuprofen, or naproxen with this medicine. Side effects such as stomach upset, nausea, or ulcers may be more likely to occur. Many medicines available without a prescription should not be taken with this medicine. This medicine can cause ulcers and bleeding in the stomach and intestines at any time during treatment. Do not smoke cigarettes or drink alcohol. These increase irritation to your stomach and can make it more susceptible to damage from this medicine. Ulcers and bleeding can happen without warning symptoms and can cause death. You may get drowsy or dizzy. Do not drive, use machinery, or do anything that needs mental alertness until you know how this medicine affects you. Do not stand or sit up quickly, especially if you are an older patient. This reduces the risk of dizzy or fainting spells. This medicine can cause you to bleed more easily. Try to avoid damage to your teeth and gums when you brush or floss your teeth. What side effects may I notice from receiving this medicine? Side effects that you should report to your doctor or health care professional as soon as possible: -black or bloody stools, blood in the urine or vomit -blurred vision -chest pain -difficulty breathing or wheezing -nausea or vomiting -severe stomach pain -skin rash, skin redness, blistering or peeling skin, hives, or itching -slurred speech or weakness on one side of the body -swelling of eyelids, throat, lips -unexplained weight gain or swelling -unusually weak or tired -yellowing of eyes or skin Side effects that usually do not require medical attention (report to your doctor or health care professional if they continue or are bothersome): -constipation -headache -heartburn This list may not describe all possible side effects. Call your doctor for medical advice about side effects. You may report side effects to FDA at  1-800-FDA-1088. Where should I keep my medicine? Keep out of the reach of children. Store at room temperature between 20 and 25 degrees C (68 and 77 degrees F). Keep container tightly closed. Throw away any unused medicine after the expiration date. NOTE: This sheet is a summary. It may not cover all possible information. If you have questions about this medicine, talk to your doctor, pharmacist, or health care provider.  2018 Elsevier/Gold Standard (2009-03-08 20:26:54)

## 2018-01-07 ENCOUNTER — Other Ambulatory Visit: Payer: Self-pay | Admitting: Family Medicine

## 2018-01-07 MED FILL — POTASSIUM CL ER 20 MEQ TAB: 20 | 30 days supply | Qty: 30 | Fill #2

## 2018-01-07 MED FILL — AMLODIPINE BESYLATE 10 MG T: 10 | 30 days supply | Qty: 30 | Fill #1

## 2018-01-07 MED FILL — FUROSEMIDE 20 MG TABLET: 20 | 30 days supply | Qty: 60 | Fill #2

## 2018-01-10 ENCOUNTER — Other Ambulatory Visit: Payer: Self-pay | Admitting: Family Medicine

## 2018-01-10 ENCOUNTER — Other Ambulatory Visit: Payer: Self-pay

## 2018-01-10 MED ORDER — NIFEDIPINE ER 30 MG PO TB24: 60.0000 mg | ORAL_TABLET | Freq: Every day | ORAL | 1 refills | Status: DC

## 2018-01-11 MED FILL — NIFEDIPINE ER 30 MG TABLET: 30 | 30 days supply | Qty: 60 | Fill #0

## 2018-01-22 ENCOUNTER — Telehealth: Payer: Self-pay

## 2018-01-22 DIAGNOSIS — G629 Polyneuropathy, unspecified: Secondary | ICD-10-CM

## 2018-01-22 MED ORDER — GABAPENTIN 300 MG PO CAPS: 600.0000 mg | ORAL_CAPSULE | Freq: Three times a day (TID) | ORAL | 1 refills | Status: DC

## 2018-01-22 MED FILL — GABAPENTIN 300 MG CAPSULE: 300 | 20 days supply | Qty: 120 | Fill #0 | Status: TO

## 2018-01-22 NOTE — Telephone Encounter (Signed)
Medication sent to pharmacy  

## 2018-01-23 ENCOUNTER — Ambulatory Visit: Payer: Medicaid Other | Admitting: Neurology

## 2018-01-23 ENCOUNTER — Encounter: Payer: Self-pay | Admitting: Neurology

## 2018-01-23 VITALS — BP 152/90 | HR 70 | Ht 61.0 in | Wt 177.0 lb

## 2018-01-23 DIAGNOSIS — I639 Cerebral infarction, unspecified: Secondary | ICD-10-CM | POA: Diagnosis not present

## 2018-01-23 DIAGNOSIS — G603 Idiopathic progressive neuropathy: Secondary | ICD-10-CM | POA: Diagnosis not present

## 2018-01-23 DIAGNOSIS — Q211 Atrial septal defect: Secondary | ICD-10-CM | POA: Diagnosis not present

## 2018-01-23 DIAGNOSIS — Q2112 Patent foramen ovale: Secondary | ICD-10-CM

## 2018-01-23 MED ORDER — TOPIRAMATE 50 MG PO TABS: 50.0000 mg | ORAL_TABLET | Freq: Two times a day (BID) | ORAL | 2 refills | Status: DC

## 2018-01-23 MED ORDER — ATORVASTATIN CALCIUM 40 MG PO TABS: 40.0000 mg | ORAL_TABLET | Freq: Every day | ORAL | 3 refills | Status: DC

## 2018-01-23 MED FILL — ATORVASTATIN CALCIUM 40 MG: 40 | 30 days supply | Qty: 30 | Fill #0 | Status: TO

## 2018-01-23 MED FILL — TOPIRAMATE 50 MG TABLET: 50 | 30 days supply | Qty: 60 | Fill #0 | Status: TO

## 2018-01-23 NOTE — Patient Instructions (Signed)
I had a long discussion with the patient with regards to her paresthesias and pain from her long-standing chronic peripheral neuropathy.  I recommend she start Topamax 50 mg daily for a week increase if tolerated without side effects to twice daily and continue gabapentin and the current dose of 600 mg 3 times daily which she is taken for years.  Check neuropathy panel labs and EMG nerve conduction study.  Continue aspirin for stroke prevention and start Lipitor 40 mg daily with aim to get LDL cholesterol below 70.  Continue treatment for hypertension with blood pressure goal below 130/90.  She was advised not to smoke and to eat a healthy diet and be active and lose weight.  Check transcranial Doppler bubble study for PFO and may consider endovascular closure in the future if PFO size is significant.  She was advised to use her wheeled walker at all times and we discussed fall and injury prevention precautions.  She will return for follow-up in 3 months or call earlier if necessary.  Neuropathic Pain Neuropathic pain is pain caused by damage to the nerves that are responsible for certain sensations in your body (sensory nerves). The pain can be caused by damage to:  The sensory nerves that send signals to your spinal cord and brain (peripheral nervous system).  The sensory nerves in your brain or spinal cord (central nervous system).  Neuropathic pain can make you more sensitive to pain. What would be a minor sensation for most people may feel very painful if you have neuropathic pain. This is usually a long-term condition that can be difficult to treat. The type of pain can differ from person to person. It may start suddenly (acute), or it may develop slowly and last for a long time (chronic). Neuropathic pain may come and go as damaged nerves heal or may stay at the same level for years. It often causes emotional distress, loss of sleep, and a lower quality of life. What are the causes? The most common  cause of damage to a sensory nerve is diabetes. Many other diseases and conditions can also cause neuropathic pain. Causes of neuropathic pain can be classified as:  Toxic. Many drugs and chemicals can cause toxic damage. The most common cause of toxic neuropathic pain is damage from drug treatment for cancer (chemotherapy).  Metabolic. This type of pain can happen when a disease causes imbalances that damage nerves. Diabetes is the most common of these diseases. Vitamin B deficiency caused by long-term alcohol abuse is another common cause.  Traumatic. Any injury that cuts, crushes, or stretches a nerve can cause damage and pain. A common example is feeling pain after losing an arm or leg (phantom limb pain).  Compression-related. If a sensory nerve gets trapped or compressed for a long period of time, the blood supply to the nerve can be cut off.  Vascular. Many blood vessel diseases can cause neuropathic pain by decreasing blood supply and oxygen to nerves.  Autoimmune. This type of pain results from diseases in which the body's defense system mistakenly attacks sensory nerves. Examples of autoimmune diseases that can cause neuropathic pain include lupus and multiple sclerosis.  Infectious. Many types of viral infections can damage sensory nerves and cause pain. Shingles infection is a common cause of this type of pain.  Inherited. Neuropathic pain can be a symptom of many diseases that are passed down through families (genetic).  What are the signs or symptoms? The main symptom is pain. Neuropathic pain is  often described as:  Burning.  Shock-like.  Stinging.  Hot or cold.  Itching.  How is this diagnosed? No single test can diagnose neuropathic pain. Your health care provider will do a physical exam and ask you about your pain. You may use a pain scale to describe how bad your pain is. You may also have tests to see if you have a high sensitivity to pain and to help find the  cause and location of any sensory nerve damage. These tests may include:  Imaging studies, such as: ? X-rays. ? CT scan. ? MRI.  Nerve conduction studies to test how well nerve signals travel through your sensory nerves (electrodiagnostic testing).  Stimulating your sensory nerves through electrodes on your skin and measuring the response in your spinal cord and brain (somatosensory evoked potentials).  How is this treated? Treatment for neuropathic pain may change over time. You may need to try different treatment options or a combination of treatments. Some options include:  Over-the-counter pain relievers.  Prescription medicines. Some medicines used to treat other conditions may also help neuropathic pain. These include medicines to: ? Control seizures (anticonvulsants). ? Relieve depression (antidepressants).  Prescription-strength pain relievers (narcotics). These are usually used when other pain relievers do not help.  Transcutaneous nerve stimulation (TENS). This uses electrical currents to block painful nerve signals. The treatment is painless.  Topical and local anesthetics. These are medicines that numb the nerves. They can be injected as a nerve block or applied to the skin.  Alternative treatments, such as: ? Acupuncture. ? Meditation. ? Massage. ? Physical therapy. ? Pain management programs. ? Counseling.  Follow these instructions at home:  Learn as much as you can about your condition.  Take medicines only as directed by your health care provider.  Work closely with all your health care providers to find what works best for you.  Have a good support system at home.  Consider joining a chronic pain support group. Contact a health care provider if:  Your pain treatments are not helping.  You are having side effects from your medicines.  You are struggling with fatigue, mood changes, depression, or anxiety. This information is not intended to replace  advice given to you by your health care provider. Make sure you discuss any questions you have with your health care provider. Document Released: 12/02/2003 Document Revised: 09/24/2015 Document Reviewed: 08/14/2013 Elsevier Interactive Patient Education  [employer].

## 2018-01-23 NOTE — Progress Notes (Signed)
[facility] [address] [place]. [place] [address] [phone]       OFFICE CONSULT NOTE  Brittany. Brittany [name] Date of Birth:  1965/05/16 Medical Record Number:  [id number]   Referring MD:  Kathe Becton, NP  Reason for Referral:  neuropathy  HPI: Brittany [name] is a 52 year pleasant African-American lady who seen today for initial office consultation visit for neuropathy.  History is provided by the patient and review of electronic medical records.  I have personally reviewed imaging films in PACS.  Patient states that she is had no diagnosis of peripheral neuropathy for more than 10 years.  She was diagnosed initially while she was living in [place].  She states she is on a neurologist who did EMG nerve conduction study to confirm the diagnosis.  Extensive testing was done but no specific etiology was determined.  She had significant paresthesias and discomfort in the feet.  She has been taking gabapentin off and on for years.  She stopped taking it a few years ago when she had no insurance but recently she has started it back for a year and is currently taking 600 mg 3 times daily which is tolerating well without side effects but feels that her pain and paresthesias in the feet and at times even in her hands is bothersome now.  She uses a wheeled walker for balance.  She had a few falls but no major injuries.  She denies significant weakness in her hands or feet.  Patient was admitted to [facility] in September 2018 with dizziness and was found to have left posterior inferior cerebellar artery infarct.  I saw her at that time.  MRI of the brain showed no dissection or large vessel occlusion.  Carotid ultrasound was unremarkable.  LDL cholesterol was 138 and A1c was 5.9.  Urine drug screen was positive for marijuana.  ESR was 8 mm.  ANA was negative.  Transesophageal echocardiogram showed no definite cardiac source of embolism but a small PFO.  Lower extremity venous  Dopplers were negative for DVT.  HIV screen was negative.  Patient was started on aspirin for stroke prevention and statin for lipids.  The patient was lost to follow-up as she had no insurance she chose not to follow-up in the clinic.  She states her dizziness did improve but since her neuropathy has gotten worse she is finding more difficulty walking and is now been using a wheeled walker.  She could not afford her medications and hence stopped taking aspirin until recently.  ROS:   14 system review of systems is positive for fatigue, shortness of breath, blurred vision, double vision, incontinence, constipation, spinning sensation, skin rash, itching, moles, urination problems, joint pain and swelling, aching muscles, headache, numbness, weakness, dizziness, insomnia and all other systems negative  PMH:  Past Medical History:  Diagnosis Date  . Hiatal hernia   . Hypertension   . IBS (irritable bowel syndrome)   . Multiple myeloma ([place])   . Peripheral neuropathy   . Stroke [facility])     Social History:  Social History   Socioeconomic History  . Marital status: Legally Separated    Spouse name: Not on file  . Number of children: Not on file  . Years of education: Not on file  . Highest education level: Not on file  Occupational History  . Not on file  Social Needs  . Financial resource strain: Not on file  . Food insecurity:    Worry: Not  on file    Inability: Not on file  . Transportation needs:    Medical: Not on file    Non-medical: Not on file  Tobacco Use  . Smoking status: Former Smoker    Last attempt to quit: 12/06/2015    Years since quitting: 2.1  . Smokeless tobacco: Never Used  Substance and Sexual Activity  . Alcohol use: No  . Drug use: Not Currently    Types: Marijuana    Comment: in the past   . Sexual activity: Not on file  Lifestyle  . Physical activity:    Days per week: Not on file    Minutes per session: Not on file  . Stress: Not on file    Relationships  . Social connections:    Talks on phone: Not on file    Gets together: Not on file    Attends religious service: Not on file    Active member of club or organization: Not on file    Attends meetings of clubs or organizations: Not on file    Relationship status: Not on file  . Intimate partner violence:    Fear of current or ex partner: Not on file    Emotionally abused: Not on file    Physically abused: Not on file    Forced sexual activity: Not on file  Other Topics Concern  . Not on file  Social History Narrative  . Not on file    Medications:   Current Outpatient Medications on File Prior to Visit  Medication Sig Dispense Refill  . albuterol (PROVENTIL HFA;VENTOLIN HFA) 108 (90 Base) MCG/ACT inhaler Inhale 2 puffs into the lungs every 4 (four) hours as needed for wheezing or shortness of breath (cough, shortness of breath or wheezing.). 1 Inhaler 2  . amLODipine (NORVASC) 10 MG tablet Take 1 tablet (10 mg total) by mouth daily. 30 tablet 3  . aspirin 325 MG tablet Take 1 tablet (325 mg total) by mouth daily. 30 tablet 0  . dimenhyDRINATE (DRAMAMINE) 50 MG tablet Take 50 mg by mouth every 8 (eight) hours as needed for dizziness.    . docusate sodium (COLACE) 100 MG capsule Take 200 mg by mouth 2 (two) times daily.     . furosemide (LASIX) 20 MG tablet Take 1 tablet (20 mg total) by mouth 2 (two) times daily as needed. 60 tablet 2  . gabapentin (NEURONTIN) 300 MG capsule Take 2 capsules (600 mg total) by mouth 3 (three) times daily. 120 capsule 1  . hydrOXYzine (VISTARIL) 50 MG capsule Take 1-2 capsules (50-100 mg total) by mouth 3 (three) times daily as needed for itching. 30 capsule 0  . methocarbamol (ROBAXIN) 500 MG tablet Take 1 tablet (500 mg total) by mouth 3 (three) times daily. 90 tablet 2  . Multiple Vitamin (MULTIVITAMIN) capsule Take 4 capsules by mouth 2 (two) times daily. Centrum plus    . naproxen (NAPROSYN) 500 MG tablet Take 1 tablet (500 mg total)  by mouth 2 (two) times daily with a meal. 60 tablet 2  . NIFEdipine (ADALAT CC) 30 MG 24 hr tablet Take 2 tablets (60 mg total) by mouth daily. 60 tablet 1  . NIFEdipine (PROCARDIA-XL/NIFEDICAL-XL) 30 MG 24 hr tablet   1  . omeprazole (PRILOSEC) 20 MG capsule Take 1 capsule (20 mg total) by mouth daily. 30 capsule 2  . polyethylene glycol powder (GLYCOLAX/MIRALAX) powder Take 17 g 2 (two) times daily as needed by mouth. 3350 g 1  .  Potassium Chloride ER 20 MEQ TBCR   3  . senna (SENOKOT) 8.6 MG TABS tablet Take 2 tablets by mouth 2 (two) times daily.    Marland Kitchen triamcinolone cream (KENALOG) 0.1 % Apply 1 application topically 2 (two) times daily. 454 g 0  . vitamin B-12 1000 MCG tablet Take 1 tablet (1,000 mcg total) by mouth daily. 30 tablet 0   No current facility-administered medications on file prior to visit.     Allergies:   Allergies  Allergen Reactions  . Darvon [Propoxyphene] Nausea And Vomiting  . Hydrocodone Nausea And Vomiting  . Lactose Intolerance (Gi)   . Other     States can't take pain meds that end in "cet" or meds that end in "ine" Darvocet  . Oxycodone Nausea And Vomiting  . Percocet [Oxycodone-Acetaminophen] Nausea And Vomiting  . Latex Itching and Rash    Physical Exam General: well developed, well nourished, middle-aged African-American lady seated, in no evident distress Head: head normocephalic and atraumatic.   Neck: supple with no carotid or supraclavicular bruits Cardiovascular: regular rate and rhythm, no murmurs Musculoskeletal: no deformity Skin:  no rash/petichiae Vascular:  Normal pulses all extremities  Neurologic Exam Mental Status: Awake and fully alert. Oriented to place and time. Recent and remote memory intact. Attention span, concentration and fund of knowledge appropriate. Mood and affect appropriate.  Cranial Nerves: Fundoscopic exam reveals sharp disc margins. Pupils equal, briskly reactive to light. Extraocular movements full without  nystagmus. Visual fields full to confrontation. Hearing intact. Facial sensation intact. Face, tongue, palate moves normally and symmetrically.  Motor: Normal bulk and tone. Normal strength in all tested extremity muscles except mild weakness of ankle dorsiflexors bilaterally.. Sensory.:  Diminished touch , pinprick , position and vibratory sensation from ankle down.  Romberg sign is weakly positive Coordination: Rapid alternating movements normal in all extremities. Finger-to-nose and heel-to-shin performed accurately bilaterally. Gait and Station: Arises from chair with t difficulty. Stance is broad-based. Gait demonstrates wide-based with mild imbalance  .  Uses a wheeled walker. Not able to heel, toe and tandem walk   Reflexes: 1+ and symmetric except both ankle jerks are absent. Toes downgoing.       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  Vascular risk factors of hypertension, hyperlipidemia and patent foramen ovale.     PLAN: I had a long discussion with the patient with regards to her paresthesias and pain from her long-standing chronic peripheral neuropathy.  I recommend she start Topamax 50 mg daily for a week increase if tolerated without side effects to twice daily and continue gabapentin and the current dose of 600 mg 3 times daily which she is taken for years.  Check neuropathy panel labs and EMG nerve conduction study.  Continue aspirin for stroke prevention and start Lipitor 40 mg daily with aim to get LDL cholesterol below 70.  Continue treatment for hypertension with blood pressure goal below 130/90.  She was advised not to smoke and to eat a healthy diet and be active and lose weight.  Check transcranial Doppler bubble study for PFO and may consider endovascular closure in the  future if PFO size is significant.  She was advised to use her wheeled walker at all times and we discussed fall and injury prevention precautions.  Greater than 50% time during this 45-minute consultation visit was spent  on counseling and coordination of care about her neuropathic pain, peripheral neuropathy, cryptogenic stroke and PFO and answering questions she will return for follow-up in 3 months or call earlier if necessary. Antony Contras, MD  [facility] [address] [place], [place] [address]  Phone [phone] Fax [phone] Note: This document was prepared with digital dictation and possible smart phrase technology. Any transcriptional errors that result from this process are unintentional.

## 2018-01-24 LAB — NEUROPATHY PANEL
A/G RATIO SPE: 1.1 (ref 0.7–1.7)
ALBUMIN ELP: 4 g/dL (ref 2.9–4.4)
ALPHA 1: 0.2 g/dL (ref 0.0–0.4)
ALPHA 2: 0.8 g/dL (ref 0.4–1.0)
Angio Convert Enzyme: 67 U/L (ref 14–82)
Anti Nuclear Antibody(ANA): NEGATIVE
Beta: 1.2 g/dL (ref 0.7–1.3)
Gamma Globulin: 1.4 g/dL (ref 0.4–1.8)
Globulin, Total: 3.7 g/dL (ref 2.2–3.9)
M-Spike, %: 0.6 g/dL — ABNORMAL HIGH
Rhuematoid fact SerPl-aCnc: <10 IU/mL (ref 0.0–13.9)
SED RATE: 68 mm/h — AB (ref 0–40)
TOTAL PROTEIN: 7.7 g/dL (ref 6.0–8.5)
TSH: 2.69 u[IU]/mL (ref 0.450–4.500)
VITAMIN B 12: 471 pg/mL (ref 232–1245)
Vit D, 25-Hydroxy: 4.9 ng/mL — ABNORMAL LOW (ref 30.0–100.0)

## 2018-01-29 ENCOUNTER — Telehealth: Payer: Self-pay

## 2018-01-29 NOTE — Telephone Encounter (Signed)
RN call patient that her vitamin d levels are low at 4.9,and needs to see her primary doctor for urgent replacement. Rest of labs are satisfactory. Rn stated the labs will be sent to her primary doctor for urgent review, and Dr Leonie Man wants her to be notified. RN stated the labs will be sent to her primary doctor, and she needs to call them today about vitamin d replacement.Pt verbalized understanding. ------

## 2018-01-29 NOTE — Telephone Encounter (Signed)
Patient saw neurology and the labs show that her vitamin D was low and that she needs to be put on a replacement. Patient is a vegetarian.

## 2018-01-29 NOTE — Telephone Encounter (Signed)
-----   Message from Garvin Fila, MD sent at 01/25/2018 11:19 AM EST ----- Kindly inform patient that vitamin D levels are very low and she needs to see her primary MD for urgent replacement. Rest of lab results appear satisfactory but all are not back yet

## 2018-01-31 ENCOUNTER — Other Ambulatory Visit: Payer: Self-pay | Admitting: Family Medicine

## 2018-01-31 DIAGNOSIS — E559 Vitamin D deficiency, unspecified: Secondary | ICD-10-CM

## 2018-01-31 MED ORDER — VITAMIN D (ERGOCALCIFEROL) 1.25 MG (50000 UNIT) PO CAPS: 50000.0000 [IU] | ORAL_CAPSULE | ORAL | 3 refills | Status: DC

## 2018-01-31 NOTE — Progress Notes (Signed)
Rx of Vitamin D sent to pharmacy today.

## 2018-01-31 NOTE — Telephone Encounter (Signed)
Patient is calling again regarding Vitamin D supplement and a vegan option.

## 2018-02-01 NOTE — Telephone Encounter (Signed)
-----   Message from Azzie Glatter, [place] sent at 01/31/2018  2:11 PM EST ----- Regarding: "Vitamin D Supplement" Brittany [name],   Please inform patient that since she has a low Vitamin D level, we recommend that she takes a daily Vitamin D supplement of 50,000 IUs once weekly to increase vitamin d levels.     Thank you.

## 2018-02-01 NOTE — Telephone Encounter (Signed)
Patient notified

## 2018-02-04 ENCOUNTER — Telehealth: Payer: Self-pay

## 2018-02-05 ENCOUNTER — Other Ambulatory Visit: Payer: Self-pay | Admitting: Family Medicine

## 2018-02-05 DIAGNOSIS — E559 Vitamin D deficiency, unspecified: Secondary | ICD-10-CM

## 2018-02-05 MED ORDER — CYANOCOBALAMIN 1000 MCG PO TABS: 1000.0000 ug | ORAL_TABLET | Freq: Every day | ORAL | 4 refills | Status: DC

## 2018-02-05 NOTE — Telephone Encounter (Signed)
Patient needs to have the Vitamin D2 sent into pharmacy because she is vegan.

## 2018-02-05 NOTE — Progress Notes (Signed)
Refill for Vitamin D sent to pharmacy today.

## 2018-02-06 ENCOUNTER — Other Ambulatory Visit: Payer: Self-pay | Admitting: Family Medicine

## 2018-02-06 DIAGNOSIS — E559 Vitamin D deficiency, unspecified: Secondary | ICD-10-CM

## 2018-02-06 MED ORDER — VITAMIN D2 10 MCG (400 UNIT) PO TABS: 10.0000 ug | ORAL_TABLET | Freq: Every day | ORAL | 6 refills | Status: DC

## 2018-02-06 NOTE — Progress Notes (Signed)
Rx for Vitamin D2 sent to pharmacy today.

## 2018-02-07 MED FILL — NIFEDIPINE ER 30 MG TABLET: 30 | 30 days supply | Qty: 60 | Fill #1

## 2018-02-07 MED FILL — OMEPRAZOLE 20 MG CAP: 20 | 30 days supply | Qty: 30 | Fill #1 | Status: TO

## 2018-02-07 MED FILL — POTASSIUM CL ER 20 MEQ TAB: 20 | 30 days supply | Qty: 30 | Fill #3

## 2018-02-07 MED FILL — AMLODIPINE BESYLATE 10 MG T: 10 | 30 days supply | Qty: 30 | Fill #2 | Status: TO

## 2018-02-18 ENCOUNTER — Ambulatory Visit (HOSPITAL_COMMUNITY)
Admission: RE | Admit: 2018-02-18 | Discharge: 2018-02-18 | Disposition: A | Discharge status: Home / Self Care | Payer: Medicaid Other | Source: Ambulatory Visit | Attending: Neurology | Admitting: Neurology

## 2018-02-18 DIAGNOSIS — Q211 Atrial septal defect: Secondary | ICD-10-CM

## 2018-02-18 DIAGNOSIS — I639 Cerebral infarction, unspecified: Secondary | ICD-10-CM | POA: Diagnosis not present

## 2018-02-18 DIAGNOSIS — Q2112 Patent foramen ovale: Secondary | ICD-10-CM

## 2018-02-18 NOTE — Progress Notes (Signed)
TCD Bubble study completed. View preliminary results under "CV Proc" in chart review.  02/18/2018 3:43 PM Maudry Mayhew, MHA, RVT, RDCS, RDMS

## 2018-02-21 ENCOUNTER — Ambulatory Visit (INDEPENDENT_AMBULATORY_CARE_PROVIDER_SITE_OTHER): Payer: Medicaid Other | Admitting: Diagnostic Neuroimaging

## 2018-02-21 ENCOUNTER — Encounter (INDEPENDENT_AMBULATORY_CARE_PROVIDER_SITE_OTHER): Payer: Medicaid Other | Admitting: Diagnostic Neuroimaging

## 2018-02-21 DIAGNOSIS — G603 Idiopathic progressive neuropathy: Secondary | ICD-10-CM | POA: Diagnosis not present

## 2018-02-21 DIAGNOSIS — Z0289 Encounter for other administrative examinations: Secondary | ICD-10-CM

## 2018-02-22 ENCOUNTER — Other Ambulatory Visit: Payer: Self-pay

## 2018-02-22 MED ORDER — NIFEDIPINE ER 30 MG PO TB24: 60.0000 mg | ORAL_TABLET | Freq: Every day | ORAL | 1 refills | Status: DC

## 2018-02-22 NOTE — Telephone Encounter (Signed)
Medication sent.

## 2018-02-26 ENCOUNTER — Ambulatory Visit: Payer: Medicaid Other | Admitting: Family Medicine

## 2018-02-26 DIAGNOSIS — R350 Frequency of micturition: Secondary | ICD-10-CM | POA: Diagnosis not present

## 2018-02-26 DIAGNOSIS — N3946 Mixed incontinence: Secondary | ICD-10-CM | POA: Diagnosis not present

## 2018-02-26 DIAGNOSIS — R351 Nocturia: Secondary | ICD-10-CM | POA: Diagnosis not present

## 2018-02-26 NOTE — Procedures (Signed)
[facility]  NCS (NERVE CONDUCTION STUDY) WITH EMG (ELECTROMYOGRAPHY) REPORT   STUDY DATE: 02/21/18 PATIENT NAME: Brittany [name] DOB: February 18, 1966 MRN: [id number]  ORDERING CLINICIAN: .Antony Contras, MD   TECHNOLOGIST: Oneita Jolly ELECTROMYOGRAPHER: Earlean Polka. Azure Budnick, MD  CLINICAL INFORMATION: 52 year old female with numbness.  FINDINGS: NERVE CONDUCTION STUDY:  Left median, right ulnar, bilateral peroneal, bilateral tibial motor responses are normal.  Right median motor response has prolonged distal latency, normal amplitude, normal conduction velocity.  Right median sensory response is prolonged peak latency and decreased amplitude.    Left median sensory response is normal.  Left median to ulnar transcarpal comparison has prolonged peak latency difference.  Bilateral ulnar, bilateral sural and bilateral superficial peroneal sensory responses are normal.   NEEDLE ELECTROMYOGRAPHY:  Needle examination of right upper and lower extremities is normal.   IMPRESSION:   Abnormal study demonstrating: - Mild bilateral median neuropathies at the wrist consistent with bilateral carpal tunnel syndrome.     INTERPRETING PHYSICIAN:  Penni Bombard, MD Certified in Neurology, Neurophysiology and Neuroimaging  [facility] [address] [place], [place] [address] [phone]   [facility]    Nerve / Sites Muscle Latency Ref. Amplitude Ref. Rel Amp Segments Distance Velocity Ref. Area    ms ms mV mV %  cm m/s m/s mVms  L Median - APB     Wrist APB 4.0 ?4.4 5.9 ?4.0 100 Wrist - APB 7   19.6     Upper arm APB 7.7  5.1  86.2 Upper arm - Wrist 21 56 ?49 16.4  R Median - APB     Wrist APB 5.4 ?4.4 4.6 ?4.0 100 Wrist - APB 7   12.3     Upper arm APB 9.3  4.6  99 Upper arm - Wrist 21 53 ?49 13.6  R Ulnar - ADM     Wrist ADM 2.2 ?3.3 8.3 ?6.0 100 Wrist - ADM 7   21.8     B.Elbow ADM 5.2  7.9  94.7 B.Elbow - Wrist 19 64 ?49 22.1     A.Elbow ADM 6.8  7.1  90 A.Elbow - B.Elbow 10 62 ?49 21.3         A.Elbow - Wrist      L Peroneal - EDB     Ankle EDB 5.2 ?6.5 2.0 ?2.0 100 Ankle - EDB 9   5.3     Fib head EDB 11.1  1.7  85.5 Fib head - Ankle 27 45 ?44 5.1     Pop fossa EDB 13.3  1.6  93.2 Pop fossa - Fib head 10 47 ?44 6.8         Pop fossa - Ankle      R Peroneal - EDB     Ankle EDB 4.4 ?6.5 5.0 ?2.0 100 Ankle - EDB 9   13.1     Fib head EDB 10.4  4.7  93.2 Fib head - Ankle 27 45 ?44 13.4     Pop fossa EDB 12.3  4.5  96.4 Pop fossa - Fib head 10 53 ?44 12.8         Pop fossa - Ankle      L Tibial - AH     Ankle AH 3.5 ?5.8 13.1 ?4.0 100 Ankle - AH 9   25.9     Pop fossa AH 11.4  8.5  64.5 Pop fossa - Ankle 34 44 ?41 21.1  R Tibial - AH  Ankle AH 3.6 ?5.8 12.0 ?4.0 100 Ankle - AH 9   23.8     Pop fossa AH 11.6  7.6  63.5 Pop fossa - Ankle 34 43 ?41 20.1                      SNC    Nerve / Sites Rec. Site Peak Lat Ref.  Amp Ref. Segments Distance Peak Diff Ref.    ms ms V V  cm ms ms  L Sural - Ankle (Calf)     Calf Ankle 3.0 ?4.4 6 ?6 Calf - Ankle 14    R Sural - Ankle (Calf)     Calf Ankle 3.2 ?4.4 8 ?6 Calf - Ankle 14    L Superficial peroneal - Ankle     Lat leg Ankle 3.9 ?4.4 8 ?6 Lat leg - Ankle 14    R Superficial peroneal - Ankle     Lat leg Ankle 3.8 ?4.4 7 ?6 Lat leg - Ankle 14    L Median, Ulnar - Transcarpal comparison     Median Palm Wrist 2.6 ?2.2 29 ?35 Median Palm - Wrist 8       Ulnar Palm Wrist 1.9 ?2.2 17 ?12 Ulnar Palm - Wrist 8          Median Palm - Ulnar Palm  0.7 ?0.4  L Median - Orthodromic (Dig II, Mid palm)     Dig II Wrist 3.3 ?3.4 11 ?10 Dig II - Wrist 13    R Median - Orthodromic (Dig II, Mid palm)     Dig II Wrist 4.1 ?3.4 4 ?10 Dig II - Wrist 13    L Ulnar - Orthodromic, (Dig V, Mid palm)     Dig V Wrist 2.6 ?3.1 15 ?5 Dig V - Wrist 11    R Ulnar - Orthodromic, (Dig V, Mid palm)     Dig V Wrist 2.5 ?3.1 15 ?5 Dig V - Wrist 97                         F  Wave      Nerve F Lat Ref.   ms ms  L Tibial - AH 44.6 ?56.0  R Tibial - AH 44.8 ?56.0  R Ulnar - ADM 23.6 ?32.0           EMG full       EMG Summary Table    Spontaneous MUAP Recruitment  Muscle IA Fib PSW Fasc Other Amp Dur. Poly Pattern  R. Deltoid Normal None None None _______ Normal Normal Normal Normal  R. Biceps brachii Normal None None None _______ Normal Normal Normal Normal  R. Triceps brachii Normal None None None _______ Normal Normal Normal Normal  R. Flexor carpi radialis Normal None None None _______ Normal Normal Normal Normal  R. First dorsal interosseous Normal None None None _______ Normal Normal Normal Normal  R. Vastus medialis Normal None None None _______ Normal Normal Normal Normal  R. Tibialis anterior Normal None None None _______ Normal Normal Normal Normal  R. Gastrocnemius (Medial head) Normal None None None _______ Normal Normal Normal Normal

## 2018-03-05 ENCOUNTER — Emergency Department (HOSPITAL_BASED_OUTPATIENT_CLINIC_OR_DEPARTMENT_OTHER)
Admission: EM | Admit: 2018-03-05 | Discharge: 2018-03-05 | Disposition: A | Discharge status: Home / Self Care | Payer: Medicaid Other | Attending: Emergency Medicine | Admitting: Emergency Medicine

## 2018-03-05 ENCOUNTER — Emergency Department (HOSPITAL_BASED_OUTPATIENT_CLINIC_OR_DEPARTMENT_OTHER): Payer: Medicaid Other

## 2018-03-05 ENCOUNTER — Encounter (HOSPITAL_BASED_OUTPATIENT_CLINIC_OR_DEPARTMENT_OTHER): Payer: Self-pay | Admitting: *Deleted

## 2018-03-05 ENCOUNTER — Other Ambulatory Visit: Payer: Self-pay

## 2018-03-05 DIAGNOSIS — K5900 Constipation, unspecified: Secondary | ICD-10-CM | POA: Diagnosis not present

## 2018-03-05 DIAGNOSIS — Z7982 Long term (current) use of aspirin: Secondary | ICD-10-CM | POA: Insufficient documentation

## 2018-03-05 DIAGNOSIS — R109 Unspecified abdominal pain: Secondary | ICD-10-CM | POA: Diagnosis not present

## 2018-03-05 DIAGNOSIS — Z79899 Other long term (current) drug therapy: Secondary | ICD-10-CM | POA: Diagnosis not present

## 2018-03-05 DIAGNOSIS — R51 Headache: Secondary | ICD-10-CM | POA: Insufficient documentation

## 2018-03-05 DIAGNOSIS — Z87891 Personal history of nicotine dependence: Secondary | ICD-10-CM | POA: Diagnosis not present

## 2018-03-05 DIAGNOSIS — R519 Headache, unspecified: Secondary | ICD-10-CM

## 2018-03-05 DIAGNOSIS — I10 Essential (primary) hypertension: Secondary | ICD-10-CM | POA: Insufficient documentation

## 2018-03-05 DIAGNOSIS — Z91040 Latex allergy status: Secondary | ICD-10-CM | POA: Insufficient documentation

## 2018-03-05 DIAGNOSIS — Z8673 Personal history of transient ischemic attack (TIA), and cerebral infarction without residual deficits: Secondary | ICD-10-CM | POA: Insufficient documentation

## 2018-03-05 LAB — BASIC METABOLIC PANEL
Anion gap: 8 (ref 5–15)
BUN: 7 mg/dL (ref 6–20)
CO2: 22 mmol/L (ref 22–32)
Calcium: 9.2 mg/dL (ref 8.9–10.3)
Chloride: 109 mmol/L (ref 98–111)
Creatinine, Ser: 0.85 mg/dL (ref 0.44–1.00)
GFR calc Af Amer: >60 mL/min (ref >60)
GLUCOSE: 94 mg/dL (ref 70–99)
Potassium: 3.5 mmol/L (ref 3.5–5.1)
Sodium: 139 mmol/L (ref 135–145)

## 2018-03-05 LAB — CBC WITH DIFFERENTIAL/PLATELET
Abs Immature Granulocytes: 0.02 10*3/uL (ref 0.00–0.07)
Basophils Absolute: 0 10*3/uL (ref 0.0–0.1)
Basophils Relative: 0 %
Eosinophils Absolute: 0.1 10*3/uL (ref 0.0–0.5)
Eosinophils Relative: 1 %
HCT: 39 % (ref 36.0–46.0)
Hemoglobin: 12.5 g/dL (ref 12.0–15.0)
Immature Granulocytes: 0 %
Lymphocytes Relative: 34 %
Lymphs Abs: 2.3 10*3/uL (ref 0.7–4.0)
MCH: 26.9 pg (ref 26.0–34.0)
MCHC: 32.1 g/dL (ref 30.0–36.0)
MCV: 83.9 fL (ref 80.0–100.0)
Monocytes Absolute: 0.6 10*3/uL (ref 0.1–1.0)
Monocytes Relative: 8 %
Neutro Abs: 3.7 10*3/uL (ref 1.7–7.7)
Neutrophils Relative %: 57 %
Platelets: 256 10*3/uL (ref 150–400)
RBC: 4.65 MIL/uL (ref 3.87–5.11)
RDW: 14.5 % (ref 11.5–15.5)
WBC: 6.6 10*3/uL (ref 4.0–10.5)
nRBC: 0 % (ref 0.0–0.2)

## 2018-03-05 MED ORDER — KETOROLAC TROMETHAMINE 30 MG/ML IJ SOLN
30.0000 mg | Freq: Once | INTRAMUSCULAR | Status: AC
  Administered 2018-03-05: 30 mg via INTRAVENOUS
  Filled 2018-03-05: qty 1

## 2018-03-05 MED ORDER — SODIUM CHLORIDE 0.9 % IV BOLUS
1000.0000 mL | Freq: Once | INTRAVENOUS | Status: AC
  Administered 2018-03-05: 1000 mL via INTRAVENOUS

## 2018-03-05 MED ORDER — ONDANSETRON HCL 4 MG/2ML IJ SOLN
4.0000 mg | Freq: Once | INTRAMUSCULAR | Status: AC
  Administered 2018-03-05: 4 mg via INTRAVENOUS
  Filled 2018-03-05: qty 2

## 2018-03-05 MED ORDER — MORPHINE SULFATE (PF) 4 MG/ML IV SOLN
4.0000 mg | Freq: Once | INTRAVENOUS | Status: DC
  Filled 2018-03-05: qty 1

## 2018-03-05 NOTE — ED Notes (Signed)
Pt. Reports she has not had a BM in 2 weeks

## 2018-03-05 NOTE — ED Triage Notes (Signed)
Headache with eyes twitching. Constipation.

## 2018-03-05 NOTE — ED Provider Notes (Signed)
[facility] EMERGENCY DEPARTMENT Provider Note   CSN: [id number] Arrival date & time: 03/05/18  1228     History   Chief Complaint Chief Complaint  Patient presents with  . Headache  . Constipation    HPI Brittany [name] is a 52 y.o. female.  Patient is a 52 year old female with past medical history of hypertension, IBS, peripheral neuropathy, and prior CVA.  She presents today with complaints of headache.  This was present upon waking from sleep this morning and associated with right eye "twitching".  She describes severe pain to the right side of her head in the temple region.  She denies any injury, trauma, or fall.  She denies any numbness, tingling, or weakness.  She also reports constipation and has not had a good bowel movement in the last 2 weeks.   The history is provided by the patient.  Headache   This is a new problem. Episode onset: This morning. The problem occurs constantly. The problem has been rapidly worsening. The headache is associated with nothing. The pain is located in the right unilateral region. The pain is severe. The pain does not radiate. Associated symptoms include nausea. Pertinent negatives include no fever, no palpitations and no vomiting.    Past Medical History:  Diagnosis Date  . Hiatal hernia   . Hypertension   . IBS (irritable bowel syndrome)   . Multiple myeloma ([place])   . Peripheral neuropathy   . Stroke [facility])     Patient Active Problem List   Diagnosis Date Noted  . B12 deficiency 12/19/2016  . Chest pain 12/18/2016  . Cerebellar infarct ([place])   . Diastolic dysfunction   . Marijuana abuse   . History of tobacco abuse   . Benign essential HTN   . Tachycardia   . Hypokalemia   . Stroke (cerebrum) ([place]) 12/05/2016  . Nonintractable headache 12/05/2016  . Occipital stroke ([place]) 12/05/2016  . Hypertensive urgency 12/05/2016  . Hypertension     Past Surgical History:  Procedure Laterality Date  . BONE BIOPSY    .  TEE WITHOUT CARDIOVERSION N/A 12/08/2016   Procedure: TRANSESOPHAGEAL ECHOCARDIOGRAM (TEE);  Surgeon: Sueanne Margarita, MD;  Location: [facility] ENDOSCOPY;  Service: Cardiovascular;  Laterality: N/A;  . TUBAL LIGATION    . WISDOM TOOTH EXTRACTION       OB History   No obstetric history on file.      Home Medications    Prior to Admission medications   Medication Sig Start Date End Date Taking? Authorizing Provider  albuterol (PROVENTIL HFA;VENTOLIN HFA) 108 (90 Base) MCG/ACT inhaler Inhale 2 puffs into the lungs every 4 (four) hours as needed for wheezing or shortness of breath (cough, shortness of breath or wheezing.). 01/02/17   Scot Jun, FNP  amLODipine (NORVASC) 10 MG tablet Take 1 tablet (10 mg total) by mouth daily. 12/03/17   Azzie Glatter, FNP  aspirin 325 MG tablet Take 1 tablet (325 mg total) by mouth daily. 12/09/16   Mikhail, Velta Addison, DO  atorvastatin (LIPITOR) 40 MG tablet Take 1 tablet (40 mg total) by mouth daily. 01/23/18   Garvin Fila, MD  cyanocobalamin 1000 MCG tablet Take 1 tablet (1,000 mcg total) by mouth daily. 02/05/18   Azzie Glatter, FNP  dimenhyDRINATE (DRAMAMINE) 50 MG tablet Take 50 mg by mouth every 8 (eight) hours as needed for dizziness.    [provider]  docusate sodium (COLACE) 100 MG capsule Take 200 mg by mouth 2 (two)  times daily.     [provider]  Ergocalciferol (VITAMIN D2) 10 MCG (400 UNIT) TABS Take 10 mcg by mouth daily. 02/06/18   Azzie Glatter, FNP  furosemide (LASIX) 20 MG tablet Take 1 tablet (20 mg total) by mouth 2 (two) times daily as needed. 09/28/17   Azzie Glatter, FNP  gabapentin (NEURONTIN) 300 MG capsule Take 2 capsules (600 mg total) by mouth 3 (three) times daily. 01/22/18   Azzie Glatter, FNP  hydrOXYzine (VISTARIL) 50 MG capsule Take 1-2 capsules (50-100 mg total) by mouth 3 (three) times daily as needed for itching. 10/24/17   Azzie Glatter, FNP  methocarbamol (ROBAXIN) 500 MG tablet  Take 1 tablet (500 mg total) by mouth 3 (three) times daily. 05/21/17   Scot Jun, FNP  Multiple Vitamin (MULTIVITAMIN) capsule Take 4 capsules by mouth 2 (two) times daily. Centrum plus    [provider]  naproxen (NAPROSYN) 500 MG tablet Take 1 tablet (500 mg total) by mouth 2 (two) times daily with a meal. 12/31/17   Azzie Glatter, FNP  NIFEdipine (ADALAT CC) 30 MG 24 hr tablet Take 2 tablets (60 mg total) by mouth daily. 02/22/18   Azzie Glatter, FNP  NIFEdipine (PROCARDIA-XL/NIFEDICAL-XL) 30 MG 24 hr tablet  01/11/18   [provider]  omeprazole (PRILOSEC) 20 MG capsule Take 1 capsule (20 mg total) by mouth daily. 12/31/17   Azzie Glatter, FNP  polyethylene glycol powder (GLYCOLAX/MIRALAX) powder Take 17 g 2 (two) times daily as needed by mouth. 01/23/17   Scot Jun, FNP  Potassium Chloride ER 20 MEQ TBCR  01/07/18   [provider]  senna (SENOKOT) 8.6 MG TABS tablet Take 2 tablets by mouth 2 (two) times daily.    [provider]  topiramate (TOPAMAX) 50 MG tablet Take 1 tablet (50 mg total) by mouth 2 (two) times daily. Start 50 mg daily x 1 week and then twice daily 01/23/18 01/23/19  Garvin Fila, MD  triamcinolone cream (KENALOG) 0.1 % Apply 1 application topically 2 (two) times daily. 01/15/17   Scot Jun, FNP    Family History Family History  Problem Relation Age of Onset  . Hypertension Mother   . Sarcoidosis Mother   . Hypertension Father   . Stroke Maternal Uncle     Social History Social History   Tobacco Use  . Smoking status: Former Smoker    Last attempt to quit: 12/06/2015    Years since quitting: 2.2  . Smokeless tobacco: Never Used  Substance Use Topics  . Alcohol use: No  . Drug use: Not Currently    Types: Marijuana    Comment: in the past      Allergies   Darvon [propoxyphene]; Hydrocodone; Lactose intolerance (gi); Other; Oxycodone; Percocet [oxycodone-acetaminophen]; and  Latex   Review of Systems Review of Systems  Constitutional: Negative for fever.  Cardiovascular: Negative for palpitations.  Gastrointestinal: Positive for nausea. Negative for vomiting.  Neurological: Positive for headaches.  All other systems reviewed and are negative.    Physical Exam Updated Vital Signs BP (!) 175/93   Pulse (!) 103   Temp 97.9 F (36.6 C) (Oral)   Resp 16   Ht 5' 1"  (1.549 m)   Wt 80.3 kg   SpO2 99%   BMI 33.45 kg/m   Physical Exam Vitals signs and nursing note reviewed.  Constitutional:      General: She is not in acute distress.  Appearance: She is well-developed. She is not diaphoretic.  HENT:     Head: Normocephalic and atraumatic.     Mouth/Throat:     Mouth: Mucous membranes are moist.  Eyes:     Extraocular Movements: Extraocular movements intact.     Pupils: Pupils are equal, round, and reactive to light.  Neck:     Musculoskeletal: Normal range of motion and neck supple.  Cardiovascular:     Rate and Rhythm: Normal rate and regular rhythm.     Heart sounds: No murmur. No friction rub. No gallop.   Pulmonary:     Effort: Pulmonary effort is normal. No respiratory distress.     Breath sounds: Normal breath sounds. No wheezing.  Abdominal:     General: Bowel sounds are normal. There is no distension.     Palpations: Abdomen is soft.     Tenderness: There is no abdominal tenderness.  Musculoskeletal: Normal range of motion.  Skin:    General: Skin is warm and dry.  Neurological:     Mental Status: She is alert and oriented to person, place, and time.     Cranial Nerves: No cranial nerve deficit, dysarthria or facial asymmetry.     Coordination: Coordination normal.      ED Treatments / Results  Labs (all labs ordered are listed, but only abnormal results are displayed) Labs Reviewed  BASIC METABOLIC PANEL  CBC WITH DIFFERENTIAL/PLATELET    EKG None  Radiology No results found.  Procedures Procedures (including  critical care time)  Medications Ordered in ED Medications  sodium chloride 0.9 % bolus 1,000 mL (has no administration in time range)  ondansetron (ZOFRAN) injection 4 mg (has no administration in time range)  morphine 4 MG/ML injection 4 mg (has no administration in time range)  ketorolac (TORADOL) 30 MG/ML injection 30 mg (has no administration in time range)     Initial Impression / Assessment and Plan / ED Course  I have reviewed the triage vital signs and the nursing notes.  Pertinent labs & imaging results that were available during my care of the patient were reviewed by me and considered in my medical decision making (see chart for details).  Patient presenting with complaints of headache and constipation.  She was given Toradol, IV fluids, and Zofran and is feeling much better.  I am uncertain as to the etiology of her headache, however her CT scan is unremarkable and neurologic exam is nonfocal.  At this point, I see no indication for admission.  She will be discharged with magnesium citrate to help with her constipation and follow-up with her primary doctor in the near future.  Final Clinical Impressions(s) / ED Diagnoses   Final diagnoses:  None    ED Discharge Orders    None       Veryl Speak, MD 03/05/18 1513

## 2018-03-05 NOTE — Discharge Instructions (Addendum)
Magnesium citrate: Drink the entire 10 ounce bottle mixed with equal parts Sprite or Gatorade for relief of constipation.  Continue other medications as previously prescribed.  Follow-up with your primary doctor if your symptoms are not improving in the next 3 to 4 days.

## 2018-03-06 ENCOUNTER — Telehealth: Payer: Self-pay | Admitting: Neurology

## 2018-03-06 ENCOUNTER — Telehealth: Payer: Self-pay

## 2018-03-06 NOTE — Telephone Encounter (Addendum)
RN call patient about wanting a DME order for splints because she cannot afford them. Rn stated a DME order is not needed to buy splints to wear. RN stated if a DME order was placed via company she will be required to pay  for a splint. Rn stated it can brought at [facility],[employer] and dme supply company. RN stated she can buy non name brand splints. PT stated she has light bill other things to pay for. Rn recommend she look online for some prices or check retail stores to see if any are on sale. Pt verbalized understanding.

## 2018-03-06 NOTE — Telephone Encounter (Signed)
Pt has called back re: the suggestion made by Dr Leonie Man that she should use carpal tunnel splints at night.  Pt asking if Dr Leonie Man will write a prescription for these because she is unable to afford them.  Please call

## 2018-03-06 NOTE — Telephone Encounter (Signed)
-----   Message from Garvin Fila, MD sent at 03/06/2018 10:54 AM EST ----- I called the patient and gave her results of the EMG nerve conduction study showing no evidence of neuropathy.  There was evidence of mild bilateral carpal tunnel and advised the patient to avoid activities with rapid repetitive wrist flexion movements and to use carpal tunnel splints at night.  She voiced understanding.

## 2018-03-06 NOTE — Telephone Encounter (Signed)
Notes recorded by Garvin Fila, MD on 03/06/2018 at 10:54 AM EST I called the patient and gave her results of the EMG nerve conduction study showing no evidence of neuropathy. There was evidence of mild bilateral carpal tunnel and advised the patient to avoid activities with rapid repetitive wrist flexion movements and to use carpal tunnel splints at night. She voiced understanding.

## 2018-03-07 ENCOUNTER — Other Ambulatory Visit: Payer: Self-pay

## 2018-03-07 DIAGNOSIS — G629 Polyneuropathy, unspecified: Secondary | ICD-10-CM

## 2018-03-07 MED ORDER — AMLODIPINE BESYLATE 10 MG PO TABS: 10.0000 mg | ORAL_TABLET | Freq: Every day | ORAL | 2 refills | Status: DC

## 2018-03-07 MED ORDER — GABAPENTIN 300 MG PO CAPS: 600.0000 mg | ORAL_CAPSULE | Freq: Three times a day (TID) | ORAL | 1 refills | Status: DC

## 2018-03-07 MED ORDER — HYDROXYZINE PAMOATE 50 MG PO CAPS: 50.0000 mg | ORAL_CAPSULE | Freq: Three times a day (TID) | ORAL | 1 refills | Status: DC | PRN

## 2018-03-07 NOTE — Telephone Encounter (Signed)
Medication sent to pharmacy  

## 2018-03-11 NOTE — Telephone Encounter (Signed)
Made in error

## 2018-03-18 DIAGNOSIS — H524 Presbyopia: Secondary | ICD-10-CM | POA: Diagnosis not present

## 2018-03-18 DIAGNOSIS — H52223 Regular astigmatism, bilateral: Secondary | ICD-10-CM | POA: Diagnosis not present

## 2018-03-19 ENCOUNTER — Other Ambulatory Visit: Payer: Self-pay

## 2018-03-19 DIAGNOSIS — K219 Gastro-esophageal reflux disease without esophagitis: Secondary | ICD-10-CM

## 2018-03-19 DIAGNOSIS — H5213 Myopia, bilateral: Secondary | ICD-10-CM | POA: Diagnosis not present

## 2018-03-19 MED ORDER — ATORVASTATIN CALCIUM 40 MG PO TABS: 40.0000 mg | ORAL_TABLET | Freq: Every day | ORAL | 3 refills | Status: DC

## 2018-03-19 MED ORDER — OMEPRAZOLE 20 MG PO CPDR: 20.0000 mg | DELAYED_RELEASE_CAPSULE | Freq: Every day | ORAL | 2 refills | Status: DC

## 2018-03-19 NOTE — Telephone Encounter (Signed)
Medication sent to pharmacy  

## 2018-03-22 ENCOUNTER — Other Ambulatory Visit: Payer: Self-pay | Admitting: Family Medicine

## 2018-03-22 ENCOUNTER — Telehealth: Payer: Self-pay

## 2018-03-22 DIAGNOSIS — E876 Hypokalemia: Secondary | ICD-10-CM

## 2018-03-22 MED ORDER — POTASSIUM CHLORIDE ER 20 MEQ PO TBCR: 20.0000 meq | EXTENDED_RELEASE_TABLET | Freq: Every day | ORAL | 3 refills | Status: DC

## 2018-03-22 NOTE — Telephone Encounter (Signed)
Is it ok to refill the Potassium Chloride. It doesn't look like it was filled through [place]

## 2018-03-25 NOTE — Telephone Encounter (Signed)
Patient notified

## 2018-03-26 ENCOUNTER — Other Ambulatory Visit: Payer: Self-pay

## 2018-03-26 DIAGNOSIS — E876 Hypokalemia: Secondary | ICD-10-CM

## 2018-03-26 MED ORDER — POTASSIUM CHLORIDE ER 20 MEQ PO TBCR: 20.0000 meq | EXTENDED_RELEASE_TABLET | Freq: Every day | ORAL | 3 refills | Status: DC

## 2018-03-26 NOTE — Telephone Encounter (Signed)
Medication sent to pharmacy  

## 2018-04-02 ENCOUNTER — Ambulatory Visit (INDEPENDENT_AMBULATORY_CARE_PROVIDER_SITE_OTHER): Payer: Medicaid Other | Admitting: Family Medicine

## 2018-04-02 ENCOUNTER — Encounter: Payer: Self-pay | Admitting: Family Medicine

## 2018-04-02 VITALS — BP 128/74 | HR 82 | Temp 97.8°F | Ht 61.0 in | Wt 171.6 lb

## 2018-04-02 DIAGNOSIS — M79604 Pain in right leg: Secondary | ICD-10-CM | POA: Diagnosis not present

## 2018-04-02 DIAGNOSIS — I10 Essential (primary) hypertension: Secondary | ICD-10-CM

## 2018-04-02 DIAGNOSIS — K219 Gastro-esophageal reflux disease without esophagitis: Secondary | ICD-10-CM

## 2018-04-02 DIAGNOSIS — K5900 Constipation, unspecified: Secondary | ICD-10-CM | POA: Diagnosis not present

## 2018-04-02 DIAGNOSIS — I639 Cerebral infarction, unspecified: Secondary | ICD-10-CM

## 2018-04-02 DIAGNOSIS — M79605 Pain in left leg: Secondary | ICD-10-CM

## 2018-04-02 DIAGNOSIS — G629 Polyneuropathy, unspecified: Secondary | ICD-10-CM

## 2018-04-02 DIAGNOSIS — E876 Hypokalemia: Secondary | ICD-10-CM | POA: Diagnosis not present

## 2018-04-02 DIAGNOSIS — Z09 Encounter for follow-up examination after completed treatment for conditions other than malignant neoplasm: Secondary | ICD-10-CM | POA: Diagnosis not present

## 2018-04-02 DIAGNOSIS — K581 Irritable bowel syndrome with constipation: Secondary | ICD-10-CM | POA: Diagnosis not present

## 2018-04-02 LAB — POCT URINALYSIS DIP (MANUAL ENTRY)
Bilirubin, UA: NEGATIVE
Blood, UA: NEGATIVE
Glucose, UA: NEGATIVE mg/dL
Ketones, POC UA: NEGATIVE mg/dL
Nitrite, UA: NEGATIVE
Spec Grav, UA: 1.025 (ref 1.010–1.025)
Urobilinogen, UA: 0.2 E.U./dL
pH, UA: 5.5 (ref 5.0–8.0)

## 2018-04-02 MED ORDER — NIFEDIPINE ER 30 MG PO TB24: 60.0000 mg | ORAL_TABLET | Freq: Every day | ORAL | 6 refills | Status: DC

## 2018-04-02 NOTE — Progress Notes (Signed)
Follow Up  Subjective:    Patient ID: Brittany [name], female    DOB: [birth year], 53 y.o.   MRN: [id number]  Chief Complaint  Patient presents with  . Follow-up    chronic condition   . leg weakness  . Constipation   HPI  Brittany [name] is a 53 year old female with a past medical history of Stroke, Peripheral Neuropathy, MM, IBS, Hypertension, and Hiatal Hernia. She is here today for follow up.  Current Status: Since her last office visit, she is doing well with c/o bilateral leg pain, r/t residual side effects from recent Stroke. She continues to have constipation. No reports of any other GI problems such as nausea, vomiting, and diarrhea.  Her last bowel movement was a few days ago. She denies visual changes, chest pain, cough, shortness of breath, heart palpitations, and falls. She has occasionally headaches and dizziness with position changes. Denies severe headaches, confusion, seizures, double vision, and blurred vision, nausea and vomiting.  She denies fevers, chills, fatigue, recent infections, weight loss, and night sweats.  She has no reports of blood in stools, dysuria and hematuria. No depression or anxiety reported. She has moderate pain today.    Review of Systems  Constitutional: Negative.   HENT: Negative.   Eyes: Negative.   Respiratory: Negative.   Cardiovascular: Negative.   Gastrointestinal: Positive for constipation.  Endocrine: Negative.   Genitourinary: Negative.   Musculoskeletal: Negative.   Skin: Negative.   Allergic/Immunologic: Negative.   Neurological: Positive for dizziness, weakness (bilateral lower extremities) and headaches.  Hematological: Negative.   Psychiatric/Behavioral: Negative.    Objective:   Physical Exam Vitals signs and nursing note reviewed.  Constitutional:      Appearance: Normal appearance.  HENT:     Head: Normocephalic and atraumatic.     Right Ear: External ear normal.     Left Ear: External ear normal.     Nose: Nose  normal.     Mouth/Throat:     Mouth: Mucous membranes are moist.     Pharynx: Oropharynx is clear.  Eyes:     Conjunctiva/sclera: Conjunctivae normal.  Neck:     Musculoskeletal: Normal range of motion and neck supple.  Cardiovascular:     Rate and Rhythm: Normal rate and regular rhythm.     Pulses: Normal pulses.     Heart sounds: Normal heart sounds.  Pulmonary:     Effort: Pulmonary effort is normal.     Breath sounds: Normal breath sounds.  Abdominal:     General: Bowel sounds are normal.     Palpations: Abdomen is soft.  Musculoskeletal: Normal range of motion.  Skin:    General: Skin is warm and dry.     Capillary Refill: Capillary refill takes less than 2 seconds.  Neurological:     General: No focal deficit present.     Mental Status: She is alert and oriented to person, place, and time.  Psychiatric:        Mood and Affect: Mood normal.        Behavior: Behavior normal.        Thought Content: Thought content normal.        Judgment: Judgment normal.    Assessment & Plan:   1. Hypertension, unspecified type Antihypertensive medications are effective. Blood pressure is 128/74 today. Continue Nifedipine and  Amlodipine as prescribed. She will continue to decrease high sodium intake, excessive alcohol intake, increase potassium intake, smoking cessation, and increase physical activity of at least  30 minutes of cardio activity daily. She will continue to follow Heart Healthy or DASH diet. - NIFEdipine (ADALAT CC) 30 MG 24 hr tablet; Take 2 tablets (60 mg total) by mouth daily.  Dispense: 60 tablet; Refill: 6  2. Cerebrovascular accident (CVA), unspecified mechanism ([place]) - Ambulatory referral to Physical Therapy  3. Constipation, unspecified constipation type - Ambulatory referral to Gastroenterology  4. Bilateral leg pain - Ambulatory referral to Physical Therapy  5. Irritable bowel syndrome with constipation Continue Colace, Miralax as prescribed.  - Ambulatory  referral to Gastroenterology  6. Neuropathy Moderate. Continue Gabapentin as prescribed. We will refer her to Physical Therapy today.   7. Hypokalemia R/t daily use of Lasix. Stable. Potassium level within normal range of 3.5 on 03/05/2018. We will continue to monitor.   8. Gastroesophageal reflux disease without esophagitis Continue Omeprazole as prescribed.   9. Follow up She will follow up in 6 months.  - POCT urinalysis dipstick  Meds ordered this encounter  Medications  . NIFEdipine (ADALAT CC) 30 MG 24 hr tablet    Sig: Take 2 tablets (60 mg total) by mouth daily.    Dispense:  60 tablet    Refill:  6     Referral Orders     Ambulatory referral to Gastroenterology     Ambulatory referral to Physical Therapy   Kathe Becton,  MSN, FNP-C [facility] [address] [place], [place] [address] [phone]

## 2018-04-09 DIAGNOSIS — H5203 Hypermetropia, bilateral: Secondary | ICD-10-CM | POA: Diagnosis not present

## 2018-04-15 ENCOUNTER — Telehealth: Payer: Self-pay | Admitting: *Deleted

## 2018-04-15 NOTE — Telephone Encounter (Signed)
Pt 3 CDs at the front desk for p/c

## 2018-04-22 ENCOUNTER — Telehealth: Payer: Self-pay

## 2018-04-22 DIAGNOSIS — R609 Edema, unspecified: Secondary | ICD-10-CM

## 2018-04-22 DIAGNOSIS — G4700 Insomnia, unspecified: Secondary | ICD-10-CM

## 2018-04-22 MED ORDER — FUROSEMIDE 20 MG PO TABS: 20.0000 mg | ORAL_TABLET | Freq: Two times a day (BID) | ORAL | 2 refills | Status: DC | PRN

## 2018-04-22 NOTE — Telephone Encounter (Signed)
Medication sent to pharmacy  

## 2018-04-24 ENCOUNTER — Telehealth: Payer: Self-pay

## 2018-04-24 ENCOUNTER — Encounter: Payer: Self-pay | Admitting: Gastroenterology

## 2018-04-24 MED ORDER — ALBUTEROL SULFATE HFA 108 (90 BASE) MCG/ACT IN AERS: 2.0000 | INHALATION_SPRAY | RESPIRATORY_TRACT | 5 refills | Status: DC | PRN

## 2018-04-24 NOTE — Telephone Encounter (Signed)
Medication refilled

## 2018-04-24 NOTE — Telephone Encounter (Signed)
Left a vm for patient to callback 

## 2018-04-26 DIAGNOSIS — R351 Nocturia: Secondary | ICD-10-CM | POA: Diagnosis not present

## 2018-04-26 DIAGNOSIS — N3946 Mixed incontinence: Secondary | ICD-10-CM | POA: Diagnosis not present

## 2018-04-26 DIAGNOSIS — R350 Frequency of micturition: Secondary | ICD-10-CM | POA: Diagnosis not present

## 2018-04-29 ENCOUNTER — Encounter: Payer: Self-pay | Admitting: Neurology

## 2018-04-29 ENCOUNTER — Ambulatory Visit: Payer: Medicaid Other | Admitting: Neurology

## 2018-04-29 VITALS — BP 136/83 | HR 109 | Wt 172.0 lb

## 2018-04-29 DIAGNOSIS — G5603 Carpal tunnel syndrome, bilateral upper limbs: Secondary | ICD-10-CM

## 2018-04-29 DIAGNOSIS — M792 Neuralgia and neuritis, unspecified: Secondary | ICD-10-CM

## 2018-04-29 MED ORDER — PREGABALIN 50 MG PO CAPS: 50.0000 mg | ORAL_CAPSULE | Freq: Three times a day (TID) | ORAL | 2 refills | Status: DC

## 2018-04-29 NOTE — Patient Instructions (Signed)
I had a long discussion with patient regarding her neuropathic pain and discussed results of EMG nerve conduction study.  I recommend she try Lyrica 50 mg 3 times daily to help with the neuropathic pain.  I also advised her to wear wrist extension splints and to avoid activities with rapid repetitive hand flexion movements.  She was advised to use a walker at all times for ambulation.  She will return for follow-up in the future in 3 months with my nurse practitioner Janett Billow or call earlier if necessary.

## 2018-04-29 NOTE — Progress Notes (Signed)
[facility] [address] [place]. [place] [address] [phone]       OFFICE CONSULT NOTE  [name] Date of Birth:  [birth year] Medical Record Number:  [id number]   Referring MD:  Brittany Becton, NP  Reason for Referral:  neuropathy  HPI: [name] is a 53 year pleasant African-American lady who seen today for initial office consultation visit for neuropathy.  History is provided by the patient and review of electronic medical records.  I have personally reviewed imaging films in PACS.  Patient states that she is had no diagnosis of peripheral neuropathy for more than 10 years.  She was diagnosed initially while she was living in [place].  She states she is on a neurologist who did EMG nerve conduction study to confirm the diagnosis.  Extensive testing was done but no specific etiology was determined.  She had significant paresthesias and discomfort in the feet.  She has been taking gabapentin off and on for years.  She stopped taking it a few years ago when she had no insurance but recently she has started it back for a year and is currently taking 600 mg 3 times daily which is tolerating well without side effects but feels that her pain and paresthesias in the feet and at times even in her hands is bothersome now.  She uses a wheeled walker for balance.  She had a few falls but no major injuries.  She denies significant weakness in her hands or feet.  Patient was admitted to [facility] in September 2018 with dizziness and was found to have left posterior inferior cerebellar artery infarct.  I saw her at that time.  MRI of the brain showed no dissection or large vessel occlusion.  Carotid ultrasound was unremarkable.  LDL cholesterol was 138 and A1c was 5.9.  Urine drug screen was positive for marijuana.  ESR was 8 mm.  ANA was negative.  Transesophageal echocardiogram showed no definite cardiac source of embolism but a small PFO.  Lower extremity venous  Dopplers were negative for DVT.  HIV screen was negative.  Patient was started on aspirin for stroke prevention and statin for lipids.  The patient was lost to follow-up as she had no insurance she chose not to follow-up in the clinic.  She states her dizziness did improve but since her neuropathy has gotten worse she is finding more difficulty walking and is now been using a wheeled walker.  She could not afford her medications and hence stopped taking aspirin until recently. Update 04/29/2008 ; She returns for follow-up after last visit 3 months ago.  She states she did not tolerate Topamax as it affected her memory and hence she stopped it.  She continues to have pain and paresthesias in her feet and sometimes in the hand as well.  She feels that the foot paresthesias have now increased to involve occasionally the left hip and buttock as well.  She remains on gabapentin 603 times daily which she is tolerating well.  She had EMG nerve conduction study done on 02/21/2018 by Dr. Leta [name] which showed no evidence of peripheral neuropathy but did show mild bilateral carpal tunnel.  She also had transcranial doppler bubble study done on 02/18/2018 which confirmed a small right-to-left intra-cardiac shunt.  The patient states she had an episode of headache on 03/05/2018 for which she went to the ER she was treated with combination of Toradol, Zofran injection as well as IV fluids which resolved her headache.  She has had no further headaches since then. ROS:   14 system review of systems is positive for fatigue, shortness of breath, blurred vision, double vision, incontinence, constipation, spinning sensation, skin rash, itching, moles, urination problems, joint pain and swelling, aching muscles, headache, numbness, weakness, dizziness, insomnia and all other systems negative  PMH:  Past Medical History:  Diagnosis Date  . Hiatal hernia   . Hypertension   . IBS (irritable bowel syndrome)   . Multiple myeloma  ([place])   . Peripheral neuropathy   . Stroke [facility])     Social History:  Social History   Socioeconomic History  . Marital status: Legally Separated    Spouse name: Not on file  . Number of children: Not on file  . Years of education: Not on file  . Highest education level: Not on file  Occupational History  . Not on file  Social Needs  . Financial resource strain: Not on file  . Food insecurity:    Worry: Not on file    Inability: Not on file  . Transportation needs:    Medical: Not on file    Non-medical: Not on file  Tobacco Use  . Smoking status: Former Smoker    Last attempt to quit: 12/06/2015    Years since quitting: 2.3  . Smokeless tobacco: Never Used  Substance and Sexual Activity  . Alcohol use: No  . Drug use: Not Currently    Types: Marijuana    Comment: in the past   . Sexual activity: Not on file  Lifestyle  . Physical activity:    Days per week: Not on file    Minutes per session: Not on file  . Stress: Not on file  Relationships  . Social connections:    Talks on phone: Not on file    Gets together: Not on file    Attends religious service: Not on file    Active member of club or organization: Not on file    Attends meetings of clubs or organizations: Not on file    Relationship status: Not on file  . Intimate partner violence:    Fear of current or ex partner: Not on file    Emotionally abused: Not on file    Physically abused: Not on file    Forced sexual activity: Not on file  Other Topics Concern  . Not on file  Social History Narrative  . Not on file    Medications:   Current Outpatient Medications on File Prior to Visit  Medication Sig Dispense Refill  . albuterol (PROVENTIL HFA;VENTOLIN HFA) 108 (90 Base) MCG/ACT inhaler Inhale 2 puffs into the lungs every 4 (four) hours as needed for wheezing or shortness of breath (cough, shortness of breath or wheezing.). 1 Inhaler 5  . amLODipine (NORVASC) 10 MG tablet Take 1 tablet (10 mg total)  by mouth daily. 30 tablet 2  . aspirin 325 MG tablet Take 1 tablet (325 mg total) by mouth daily. 30 tablet 0  . atorvastatin (LIPITOR) 40 MG tablet Take 1 tablet (40 mg total) by mouth daily. 30 tablet 3  . cyanocobalamin 1000 MCG tablet Take 1 tablet (1,000 mcg total) by mouth daily. 30 tablet 4  . dimenhyDRINATE (DRAMAMINE) 50 MG tablet Take 50 mg by mouth every 8 (eight) hours as needed for dizziness.    . docusate sodium (COLACE) 100 MG capsule Take 200 mg by mouth 2 (two) times daily.     . Ergocalciferol (VITAMIN D2) 10  MCG (400 UNIT) TABS Take 10 mcg by mouth daily. 30 tablet 6  . furosemide (LASIX) 20 MG tablet Take 1 tablet (20 mg total) by mouth 2 (two) times daily as needed. 60 tablet 2  . gabapentin (NEURONTIN) 300 MG capsule Take 2 capsules (600 mg total) by mouth 3 (three) times daily. 120 capsule 1  . hydrOXYzine (VISTARIL) 50 MG capsule Take 1-2 capsules (50-100 mg total) by mouth 3 (three) times daily as needed for itching. 30 capsule 1  . methocarbamol (ROBAXIN) 500 MG tablet Take 1 tablet (500 mg total) by mouth 3 (three) times daily. 90 tablet 2  . Multiple Vitamin (MULTIVITAMIN) capsule Take 4 capsules by mouth 2 (two) times daily. Centrum plus    . naproxen (NAPROSYN) 500 MG tablet Take 1 tablet (500 mg total) by mouth 2 (two) times daily with a meal. 60 tablet 2  . NIFEdipine (ADALAT CC) 30 MG 24 hr tablet Take 2 tablets (60 mg total) by mouth daily. 60 tablet 6  . omeprazole (PRILOSEC) 20 MG capsule Take 1 capsule (20 mg total) by mouth daily. 30 capsule 2  . polyethylene glycol powder (GLYCOLAX/MIRALAX) powder Take 17 g 2 (two) times daily as needed by mouth. 3350 g 1  . senna (SENOKOT) 8.6 MG TABS tablet Take 2 tablets by mouth 2 (two) times daily.    Marland Kitchen triamcinolone cream (KENALOG) 0.1 % Apply 1 application topically 2 (two) times daily. 454 g 0  . Potassium Chloride ER 20 MEQ TBCR Take 20 mEq by mouth daily. 30 tablet 3   No current facility-administered medications  on file prior to visit.     Allergies:   Allergies  Allergen Reactions  . Darvon [Propoxyphene] Nausea And Vomiting  . Hydrocodone Nausea And Vomiting  . Lactose Intolerance (Gi)   . Other     States can't take pain meds that end in "cet" or meds that end in "ine" Darvocet  . Oxycodone Nausea And Vomiting  . Percocet [Oxycodone-Acetaminophen] Nausea And Vomiting  . Topamax [Topiramate]     Memory made her emotional   . Latex Itching and Rash    Physical Exam General: well developed, well nourished, middle-aged African-American lady seated, in no evident distress Head: head normocephalic and atraumatic.   Neck: supple with no carotid or supraclavicular bruits Cardiovascular: regular rate and rhythm, no murmurs Musculoskeletal: no deformity Skin:  no rash/petichiae 1+ pedal edema bilateral lower extremities left greater than right Vascular:  Normal pulses all extremities  Neurologic Exam Mental Status: Awake and fully alert. Oriented to place and time. Recent and remote memory intact. Attention span, concentration and fund of knowledge appropriate. Mood and affect appropriate.  Cranial Nerves: Fundoscopic exam reveals sharp disc margins. Pupils equal, briskly reactive to light. Extraocular movements full without nystagmus. Visual fields full to confrontation. Hearing intact. Facial sensation intact. Face, tongue, palate moves normally and symmetrically.  Motor: Normal bulk and tone. Normal strength in all tested extremity muscles except mild weakness of ankle dorsiflexors bilaterally.. Sensory.:  Diminished touch , pinprick , position and vibratory sensation from ankle down.  Romberg sign is weakly positive Coordination: Rapid alternating movements normal in all extremities. Finger-to-nose and heel-to-shin performed accurately bilaterally. Gait and Station: Arises from chair with t difficulty. Stance is broad-based. Gait demonstrates wide-based with mild imbalance  .  Uses a wheeled  walker. Not able to heel, toe and tandem walk   Reflexes: 1+ and symmetric except both ankle jerks are absent. Toes downgoing.  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  Vascular risk factors of hypertension, hyperlipidemia and patent foramen ovale.  Mild bilateral carpal tunnel syndrome     PLAN: I had a long discussion with patient regarding her neuropathic pain and discussed results of EMG nerve conduction study.  I recommend she try Lyrica 50 mg 3 times daily to help with the neuropathic pain.  I also advised her to wear wrist extension splints and to avoid activities with rapid repetitive hand flexion movements.  She was advised to use a walker at all times for ambulation.  She will return for follow-up in the future in 3 months with my nurse practitioner Janett Billow or call earlier if necessary. Greater than 50% time during this 25-minute  visit was spent on counseling and coordination of care about her neuropathic pain, peripheral neuropathy, cryptogenic stroke and PFO and answering questions she will return for follow-up in 3 months or call earlier if necessary. Antony Contras, MD  [facility] [address] [place], [place] [address]  Phone [phone] Fax [phone] Note: This document was prepared with digital dictation and possible smart phrase technology. Any transcriptional errors that result from this process are unintentional.

## 2018-05-01 ENCOUNTER — Telehealth: Payer: Self-pay | Admitting: Neurology

## 2018-05-01 NOTE — Telephone Encounter (Signed)
Called the patient and advised her to taper the gabapentin to 300 mg 3 times daily for a week, 300 mg twice daily for a week, once daily for a week and then stop.  She is to continue Lyrica as prescribed

## 2018-05-01 NOTE — Telephone Encounter (Signed)
Pt started lyrica last night. Pharmacist advised her she probably did not need to take gabapentin (NEURONTIN) 300 MG capsule since starting lyrica. Please call to advise

## 2018-05-06 ENCOUNTER — Ambulatory Visit: Payer: Medicaid Other | Admitting: Physical Therapy

## 2018-05-09 ENCOUNTER — Ambulatory Visit: Payer: Medicaid Other | Admitting: Gastroenterology

## 2018-05-13 ENCOUNTER — Ambulatory Visit: Payer: Medicaid Other | Admitting: Physical Therapy

## 2018-05-21 ENCOUNTER — Telehealth: Payer: Self-pay | Admitting: Neurology

## 2018-05-21 ENCOUNTER — Other Ambulatory Visit: Payer: Self-pay

## 2018-05-21 ENCOUNTER — Ambulatory Visit: Payer: Medicaid Other | Attending: Family Medicine | Admitting: Physical Therapy

## 2018-05-21 ENCOUNTER — Telehealth: Payer: Self-pay

## 2018-05-21 ENCOUNTER — Encounter: Payer: Self-pay | Admitting: Physical Therapy

## 2018-05-21 DIAGNOSIS — R42 Dizziness and giddiness: Secondary | ICD-10-CM | POA: Insufficient documentation

## 2018-05-21 DIAGNOSIS — R262 Difficulty in walking, not elsewhere classified: Secondary | ICD-10-CM

## 2018-05-21 DIAGNOSIS — R261 Paralytic gait: Secondary | ICD-10-CM | POA: Diagnosis present

## 2018-05-21 DIAGNOSIS — R29898 Other symptoms and signs involving the musculoskeletal system: Secondary | ICD-10-CM | POA: Insufficient documentation

## 2018-05-21 DIAGNOSIS — R2681 Unsteadiness on feet: Secondary | ICD-10-CM | POA: Diagnosis present

## 2018-05-21 DIAGNOSIS — M6281 Muscle weakness (generalized): Secondary | ICD-10-CM | POA: Diagnosis present

## 2018-05-21 NOTE — Therapy (Signed)
[facility] [address]  [place], [place], [address] Phone: [phone]   Fax:  [phone]  Physical Therapy Evaluation  Patient Details  Name: Brittany [name] MRN: [id number] Date of Birth: 1965/11/04 Referring Provider (PT): Kathe Becton, FNP   Encounter Date: 05/21/2018  PT End of Session - 05/21/18 1445    Visit Number  1    Number of Visits  4    Date for PT Re-Evaluation  06/11/18    Authorization Type  Medicaid    PT Start Time  1400    PT Stop Time  1440    PT Time Calculation (min)  40 min    Activity Tolerance  Patient tolerated treatment well;Patient limited by pain    Behavior During Therapy  [facility] for tasks assessed/performed       Past Medical History:  Diagnosis Date  . Hiatal hernia   . Hypertension   . IBS (irritable bowel syndrome)   . Multiple myeloma ([place])   . Peripheral neuropathy   . Stroke [facility])     Past Surgical History:  Procedure Laterality Date  . BONE BIOPSY    . TEE WITHOUT CARDIOVERSION N/A 12/08/2016   Procedure: TRANSESOPHAGEAL ECHOCARDIOGRAM (TEE);  Surgeon: Sueanne Margarita, MD;  Location[facility] ENDOSCOPY;  Service: Cardiovascular;  Laterality: N/A;  . TUBAL LIGATION    . WISDOM TOOTH EXTRACTION      There were no vitals filed for this visit.   Subjective Assessment - 05/21/18 1402    Subjective  Patient reports that she had a CVA in Oct 2018- lasting effects from this include decreased balance, B LE swelling and pain, lack of endurance with prolonged walking. Patient now ambulating with [phone] uses this at all times. Has vertigo off and on and uses this to avoid falls. Has had 3 falls in the last year; last one was when she was recovering from anesthesia. Denies injuries from falls. LE swelling and N/T and prickling occurs below knees on B sides. Reports that this pain intermittently radiates up to buttocks, worse with elevation. Prickling worse when she performs prolonged  sitting or when she starts moving. Also reports pinched nerves in her wrists- has pain with driving, washing dishes.    Pertinent History  stroke, peripheral neuropathy, multiple myeloma, HTN, hiatal hernia, TEE with cardioversion    Limitations  Sitting;Lifting;Standing;Walking;House hold activities    How long can you stand comfortably?  2-3 min limited by pain in B LEs    How long can you walk comfortably?  3-5 min limited by fatigue    Diagnostic tests  none recent    Patient Stated Goals  work on leg strength    Currently in Pain?  Yes    Pain Score  0-No pain    Pain Location  Leg    Pain Orientation  Right;Left;Anterior;Posterior    Pain Descriptors / Indicators  Tingling;Numbness;Pins and needles    Pain Type  Chronic pain    Pain Radiating Towards  throughout entire lower legs         [facility] PT Assessment - 05/21/18 1412      Assessment   Medical Diagnosis  CVA, B LE pain    Referring Provider (PT)  Kathe Becton, FNP    Onset Date/Surgical Date  12/18/16    Hand Dominance  Right    Next MD Visit  10/01/18    Prior Therapy  Yes- HH      Precautions  Precautions  Fall      Restrictions   Weight Bearing Restrictions  No      Balance Screen   Has the patient fallen in the past 6 months  Yes    How many times?  1    Has the patient had a decrease in activity level because of a fear of falling?   Yes    Is the patient reluctant to leave their home because of a fear of falling?   Yes      [place]  Private residence    Living Arrangements  Alone    Available Help at Discharge  Family    Type of [place] Access  Level entry    [place] - 4 wheels      Prior Function   Level of Independence  Independent with household mobility with device   transportation and grocery shopping   Vocation  On disability    Leisure  being social      Cognition   Overall Cognitive Status  Within  Functional Limits for tasks assessed      Sensation   Light Touch  Impaired by gross assessment   decreased sensation over R dorsum of foot     Coordination   Gross Motor Movements are Fluid and Coordinated  Yes      Posture/Postural Control   Posture/Postural Control  Postural limitations    Postural Limitations  Rounded Shoulders;Posterior pelvic tilt      ROM / Strength   AROM / PROM / Strength  Strength;AROM      AROM   AROM Assessment Site  Ankle    Right/Left Ankle  Right;Left    Right Ankle Dorsiflexion  -4    Left Ankle Dorsiflexion  -10      Strength   Strength Assessment Site  Hip;Knee;Ankle    Right/Left Hip  Right;Left    Right Hip Flexion  3+/5    Right Hip ABduction  3+/5    Right Hip ADduction  3/5    Left Hip Flexion  3+/5    Left Hip ABduction  3+/5    Left Hip ADduction  3/5    Right/Left Knee  Right;Left    Right Knee Flexion  3+/5    Right Knee Extension  4-/5    Left Knee Flexion  4-/5    Left Knee Extension  4-/5    Right/Left Ankle  Right;Left    Right Ankle Dorsiflexion  2+/5    Right Ankle Plantar Flexion  2+/5    Left Ankle Dorsiflexion  2+/5    Left Ankle Plantar Flexion  3+/5      Palpation   Palpation comment  no TTP      Ambulation/Gait   Assistive device  4-wheeled walker    Gait Pattern  Step-to pattern;Step-through pattern;Decreased hip/knee flexion - right;Decreased hip/knee flexion - left;Decreased dorsiflexion - right;Decreased dorsiflexion - left;Poor foot clearance - right;Poor foot clearance - left;Shuffle;Right flexed knee in stance;Left flexed knee in stance;Trunk flexed    Ambulation Surface  Level;Indoor    Gait velocity  significantly decreased      Standardized Balance Assessment   Standardized Balance Assessment  Five Times Sit to Stand;Timed Up and Go Test    Five times sit to stand comments   28.9 sec with B use of armrests   heavy use of UEs and  unable to stand fully upright     Timed Up and Go Test   Normal TUG  (seconds)  25.7   with 4WW               Objective measurements completed on examination: See above findings.              PT Education - 05/21/18 1443    Education Details  prognosis, POC, HEP    Person(s) Educated  Patient    Methods  Explanation;Demonstration;Tactile cues;Verbal cues;Handout    Comprehension  Verbalized understanding;Returned demonstration       PT Short Term Goals - 05/21/18 1825      PT SHORT TERM GOAL #1   Title  Patient to be independent with initial HEP.    Time  1    Period  Weeks    Status  New    Target Date  05/28/18        PT Long Term Goals - 05/21/18 1830      PT LONG TERM GOAL #1   Title  Patient to be independent with advanced HEP.    Time  3    Period  Weeks    Status  New    Target Date  06/11/18      PT LONG TERM GOAL #2   Title  Patient to score B LE strength atleast 3+/5.     Time  3    Period  Weeks    Status  New    Target Date  06/11/18      PT LONG TERM GOAL #3   Title  Patient to improve TUG score by 10 sec in order to make progress towards decreased risk of falls.    Baseline  05/21/18 TUG 25.7 sec    Time  3    Period  Weeks    Status  New    Target Date  06/11/18      PT LONG TERM GOAL #4   Title  Patient to improve 5xSTS score by 10 sec in order to make progress towards decreased risk of falls.    Baseline  05/21/18 5xSTS 28.9 sec with B UE support    Time  3    Period  Weeks    Status  New    Target Date  06/11/18      PT LONG TERM GOAL #5   Title  Patient to report tolerance of standing/walking for 15 min before pain or fatigue limit her.     Time  3    Period  Weeks    Status  New    Target Date  06/11/18      Additional Long Term Goals   Additional Long Term Goals  Yes      PT LONG TERM GOAL #6   Title  Patient to demonstrate 0 deg of B ankle AROM to decrease risk of falls.     Time  3    Period  Weeks    Status  New    Target Date  06/11/18             Plan -  05/21/18 1446    Clinical Impression Statement  Patient is a 53y/o F, PMH significant for CVA in October 2018, presenting to OPPT with c/o LE weakness, difficulty walking, and decreased balance. Patient also reports paresthesias in B LEs from knees down to toes. Notes that she has struggled with falls and decreased balance, lack of endurance  with walking and standing, pain and swelling in B LEs. Patient currently ambulating with 4WW in the household and out in the community. Patient today with marked B LE weakness, decreased B ankle DF ROM, decreased gait speed and gait deviations, and decreased functional activity tolerance. Educated patient on gentle stretching and strengthening HEP with standing HEP to be performed at counter top for safety. Patient reported understanding. Would benefit form skilled PT services 1x/week for 3 weeks to address aforementioned impairments.     Personal Factors and Comorbidities  Comorbidity 3+;Social Background;Time since onset of injury/illness/exacerbation;Finances    Comorbidities  stroke, peripheral neuropathy, multiple myeloma, HTN, hiatal hernia, TEE with cardioversion    Examination-Activity Limitations  Bathing;Bend;Carry;Squat;Stairs;Stand;Lift;Transfers;Locomotion Level    Examination-Participation Restrictions  Cleaning;Community Activity;Shop;Driving;Yard Work;Interpersonal Relationship;Laundry;Meal Prep    Stability/Clinical Decision Making  Evolving/Moderate complexity    Clinical Decision Making  Moderate    Rehab Potential  Good    PT Frequency  1x / week    PT Duration  3 weeks    PT Treatment/Interventions  ADLs/Self Care Home Management;Cryotherapy;Electrical Stimulation;Functional mobility training;Stair training;Gait training;DME Instruction;Moist Heat;Therapeutic activities;Therapeutic exercise;Balance training;Neuromuscular re-education;Patient/family education;Passive range of motion;Manual techniques;Dry needling;Energy  conservation;Splinting;Taping;Orthotic Fit/Training;Vasopneumatic Device;Vestibular    PT Next Visit Plan  reassess HEP    Consulted and Agree with Plan of Care  Patient       Patient will benefit from skilled therapeutic intervention in order to improve the following deficits and impairments:  Abnormal gait, Decreased endurance, Decreased activity tolerance, Decreased strength, Pain, Decreased balance, Decreased mobility, Difficulty walking, Increased muscle spasms, Improper body mechanics, Decreased range of motion, Decreased safety awareness, Impaired flexibility, Postural dysfunction  Visit Diagnosis: Paralytic gait  Difficulty in walking, not elsewhere classified  Muscle weakness (generalized)  Other symptoms and signs involving the musculoskeletal system     Problem List Patient Active Problem List   Diagnosis Date Noted  . B12 deficiency 12/19/2016  . Chest pain 12/18/2016  . Cerebellar infarct ([place])   . Diastolic dysfunction   . Marijuana abuse   . History of tobacco abuse   . Benign essential HTN   . Tachycardia   . Hypokalemia   . Stroke (cerebrum) ([place]) 12/05/2016  . Nonintractable headache 12/05/2016  . Occipital stroke ([place]) 12/05/2016  . Hypertensive urgency 12/05/2016  . Hypertension     Janene Harvey, PT, DPT 05/21/18 6:38 PM   [facility] [address]  [place], [place], [address] Phone: [phone]   Fax:  [phone]  Name: Brittany [name] MRN: [id number] Date of Birth: 11-22-1965

## 2018-05-21 NOTE — Telephone Encounter (Signed)
error 

## 2018-05-22 ENCOUNTER — Other Ambulatory Visit: Payer: Self-pay | Admitting: Family Medicine

## 2018-05-22 ENCOUNTER — Encounter: Payer: Self-pay | Admitting: Gastroenterology

## 2018-05-22 ENCOUNTER — Ambulatory Visit: Payer: Medicaid Other | Admitting: Gastroenterology

## 2018-05-22 VITALS — BP 134/78 | HR 95 | Ht 61.0 in | Wt 169.5 lb

## 2018-05-22 DIAGNOSIS — K219 Gastro-esophageal reflux disease without esophagitis: Secondary | ICD-10-CM | POA: Diagnosis not present

## 2018-05-22 DIAGNOSIS — R1032 Left lower quadrant pain: Secondary | ICD-10-CM

## 2018-05-22 DIAGNOSIS — K581 Irritable bowel syndrome with constipation: Secondary | ICD-10-CM

## 2018-05-22 DIAGNOSIS — G629 Polyneuropathy, unspecified: Secondary | ICD-10-CM

## 2018-05-22 MED ORDER — OMEPRAZOLE 20 MG PO CPDR: 20.0000 mg | DELAYED_RELEASE_CAPSULE | Freq: Two times a day (BID) | ORAL | 3 refills | Status: DC

## 2018-05-22 MED ORDER — SUPREP BOWEL PREP KIT 17.5-3.13-1.6 GM/177ML PO SOLN: 1.0000 | ORAL | 0 refills | Status: DC

## 2018-05-22 MED ORDER — GABAPENTIN 300 MG PO CAPS: 600.0000 mg | ORAL_CAPSULE | Freq: Three times a day (TID) | ORAL | 1 refills | Status: DC

## 2018-05-22 NOTE — Patient Instructions (Signed)
If you are age 53 or older, your body mass index should be between 23-30. Your Body mass index is 32.03 kg/m. If this is out of the aforementioned range listed, please consider follow up with your Primary Care Provider.  If you are age 34 or younger, your body mass index should be between 19-25. Your Body mass index is 32.03 kg/m. If this is out of the aformentioned range listed, please consider follow up with your Primary Care Provider.   We have sent the following medications to your pharmacy for you to pick up at your convenience: Omeprazole Suprep  You [facility]  [id number] [address] [facility]   You are scheduled on 05/30/18  at [place] should arrive 15 minutes prior to your appointment time for registration. Please follow the written instructions below on the day of your exam:  WARNING: IF YOU ARE ALLERGIC TO IODINE/X-RAY DYE, PLEASE NOTIFY RADIOLOGY IMMEDIATELY AT [phone]! YOU WILL BE GIVEN A 13 HOUR PREMEDICATION PREP.  1) Do not eat or drink anything after 5am (4 hours prior to your test) 2) You have been given 2 bottles of oral contrast to drink. The solution may taste better if refrigerated, but do NOT add ice or any other liquid to this solution. Shake well before drinking.    Drink 1 bottle of contrast @ 7am (2 hours prior to your exam)  Drink 1 bottle of contrast @ 8am (1 hour prior to your exam)  You may take any medications as prescribed with a small amount of water, if necessary. If you take any of the following medications: METFORMIN, GLUCOPHAGE, GLUCOVANCE, AVANDAMET, RIOMET, FORTAMET, [place] MET, JANUMET, GLUMETZA or METAGLIP, you MAY be asked to HOLD this medication 48 hours AFTER the exam.  The purpose of you drinking the oral contrast is to aid in the visualization of your intestinal tract. The contrast solution may cause some diarrhea. Depending on your  individual set of symptoms, you may also receive an intravenous injection of x-ray contrast/dye. Plan on being at [facility] for 30 minutes or longer, depending on the type of exam you are having performed.  This test typically takes 30-45 minutes to complete.  If you have any questions regarding your exam or if you need to reschedule, you may call the CT department at [phone] between the hours of 8:00 am and 5:00 pm, Monday-Friday.  ______________________________________________________________   Dennis Bast have been scheduled for an endoscopy and colonoscopy. Please follow the written instructions given to you at your visit today. Please pick up your prep supplies at the pharmacy within the next 1-3 days. If you use inhalers (even only as needed), please bring them with you on the day of your procedure. Your physician has requested that you go to www.startemmi.com and enter the access code given to you at your visit today. This web site gives a general overview about your procedure. However, you should still follow specific instructions given to you by our office regarding your preparation for the procedure.  Two days before your procedure: Mix 3 packs (or capfuls) of Miralax in 48 ounces of clear liquid and drink at 6pm.  Thank you,  Dr. Jackquline Denmark

## 2018-05-22 NOTE — Progress Notes (Signed)
Chief Complaint: Abdominal pain  Referring Provider:  Azzie Glatter, FNP      ASSESSMENT AND PLAN;   #1. GERD with [place] #2. LLQ abdominal pain. #3. Colorectal cancer screening. #4. IBS with predominant constipation (exacerbated by medications especially calcium channel blockers). Nl TSH  Plan: - CT abdo/pelvis with p.o. and IV contrast. -Thereafter, proceed with EGD/colon with 2-day prep. Discussed risks & benefits. (Risks including rare perforation req laparotomy, bleeding after biopsies/polypectomy req blood transfusion, rare chance of missing neoplasms, risks of anesthesia/sedation). Benefits outweigh the risks. Patient agrees to proceed. All the questions were answered. Consent forms given for review. - Continiue senna 4/day and colace 2/day for now. - Increase omeprazole 53m po bid. - Increase water intake.   HPI:    Brittany Knoebelis a 53y.o. female  On disability With left lower quadrant abdominal pain, worse over the last few days without any associated fever or chills. Has abdominal bloating Pain gets worse on walking. Has longstanding history of constipation with pellet-like stools, lower abdominal discomfort x 10 yrs.  This does get better with defecation.  However, the above left lower quadrant pain is somewhat new.   Failed MiraLAX in the past.  Currently taking 4 senna per day and Colace 2/day.  Still would have bowel movements at the frequency of 2/week.  She would not have bowel movement for weeks if she does not take any laxatives.  No melena or hematochezia.  Denies having any significant weight loss.  Blood pressure has been hard to control previously-interestingly she is on amlodipine and nifedipine.  The constipation has gotten little worse ever since she has been on these medications.  Has longstanding history of reflux.  Was told that she has hiatal hernia.  She has been taking omeprazole 20 mg p.o. once a day and still having breakthrough  symptoms specially at night.  No weight loss.  Has E[place]/Colon over10ys ago [place]   Past Medical History:  Diagnosis Date  . Anemia   . Elevated cholesterol   . Female bladder prolapse   . Hiatal hernia   . Hypertension   . IBS (irritable bowel syndrome)   . Multiple myeloma ([place]   . Peripheral neuropathy   . Stroke ([facility]     Past Surgical History:  Procedure Laterality Date  . BONE BIOPSY    . COLONOSCOPY     over 10 years x3  . ESOPHAGOGASTRODUODENOSCOPY     incomplete-over 10 years ago   . TEE WITHOUT CARDIOVERSION N/A 12/08/2016   Procedure: TRANSESOPHAGEAL ECHOCARDIOGRAM (TEE);  Surgeon: TSueanne Margarita MD;  [facility];  Service: Cardiovascular;  Laterality: N/A;  . TUBAL LIGATION    . WISDOM TOOTH EXTRACTION      Family History  Problem Relation Age of Onset  . Hypertension Mother   . Sarcoidosis Mother        currently in remission   . Diverticulitis Mother   . Irritable bowel syndrome Mother   . Hypertension Father   . Stomach cancer Father   . Congestive Heart Failure Father   . Stroke Maternal Uncle   . Colon cancer Neg Hx   . Esophageal cancer Neg Hx     Social History   Tobacco Use  . Smoking status: Former Smoker    Last attempt to quit: 12/06/2015    Years since quitting: 2.4  . Smokeless tobacco: Never Used  Substance Use Topics  . Alcohol use: Not Currently  . Drug  use: Not Currently    Types: Marijuana    Comment: in the past     Current Outpatient Medications  Medication Sig Dispense Refill  . albuterol (PROVENTIL HFA;VENTOLIN HFA) 108 (90 Base) MCG/ACT inhaler Inhale 2 puffs into the lungs every 4 (four) hours as needed for wheezing or shortness of breath (cough, shortness of breath or wheezing.). 1 Inhaler 5  . amLODipine (NORVASC) 10 MG tablet Take 1 tablet (10 mg total) by mouth daily. 30 tablet 2  . aspirin 325 MG tablet Take 1 tablet (325 mg total) by mouth daily. 30 tablet 0  . atorvastatin (LIPITOR) 40 MG tablet  Take 1 tablet (40 mg total) by mouth daily. 30 tablet 3  . cyanocobalamin 1000 MCG tablet Take 1 tablet (1,000 mcg total) by mouth daily. 30 tablet 4  . dimenhyDRINATE (DRAMAMINE) 50 MG tablet Take 50 mg by mouth every 8 (eight) hours as needed for dizziness.    . docusate sodium (COLACE) 100 MG capsule Take 200 mg by mouth 2 (two) times daily.     . Ergocalciferol (VITAMIN D2) 10 MCG (400 UNIT) TABS Take 10 mcg by mouth daily. 30 tablet 6  . furosemide (LASIX) 20 MG tablet Take 1 tablet (20 mg total) by mouth 2 (two) times daily as needed. 60 tablet 2  . gabapentin (NEURONTIN) 300 MG capsule Take 2 capsules (600 mg total) by mouth 3 (three) times daily. 120 capsule 1  . hydrOXYzine (VISTARIL) 50 MG capsule Take 1-2 capsules (50-100 mg total) by mouth 3 (three) times daily as needed for itching. 30 capsule 1  . methocarbamol (ROBAXIN) 500 MG tablet Take 1 tablet (500 mg total) by mouth 3 (three) times daily. 90 tablet 2  . Multiple Vitamin (MULTIVITAMIN) capsule Take 4 capsules by mouth 2 (two) times daily. Centrum plus    . NIFEdipine (ADALAT CC) 30 MG 24 hr tablet Take 2 tablets (60 mg total) by mouth daily. 60 tablet 6  . omeprazole (PRILOSEC) 20 MG capsule Take 1 capsule (20 mg total) by mouth daily. 30 capsule 2  . polyethylene glycol powder (GLYCOLAX/MIRALAX) powder Take 17 g 2 (two) times daily as needed by mouth. 3350 g 1  . pregabalin (LYRICA) 50 MG capsule Take 1 capsule (50 mg total) by mouth 3 (three) times daily. 90 capsule 2  . senna (SENOKOT) 8.6 MG TABS tablet Take 2 tablets by mouth 2 (two) times daily.    [place] Kitchen triamcinolone cream (KENALOG) 0.1 % Apply 1 application topically 2 (two) times daily. 454 g 0  . Potassium Chloride ER 20 MEQ TBCR Take 20 mEq by mouth daily. 30 tablet 3   No current facility-administered medications for this visit.     Allergies  Allergen Reactions  . Darvon [Propoxyphene] Nausea And Vomiting  . Hydrocodone Nausea And Vomiting  . Lactose Intolerance  (Gi)   . Naproxen   . Other     States can't take pain meds that end in "cet" or meds that end in "ine" Darvocet  . Oxycodone Nausea And Vomiting  . Percocet [Oxycodone-Acetaminophen] Nausea And Vomiting  . Topamax [Topiramate]     Memory made her emotional   . Latex Itching and Rash    Review of Systems:  Constitutional: Denies fever, chills, diaphoresis, appetite change and has fatigue.  HEENT: Denies photophobia, eye pain, redness, hearing loss, ear pain, congestion, sore throat, rhinorrhea, sneezing, mouth sores, neck pain, neck stiffness and tinnitus.   Respiratory: Denies SOB, DOE, cough, chest tightness,  and  wheezing.   Cardiovascular: Denies chest pain, palpitations and leg swelling.  Genitourinary: Has excessive frequent urination and urine leakage.  Has prolapsed urinary bladder. Musculoskeletal: Has myalgias, back pain, joint swelling, arthralgias and gait problem.  Skin: No rash.  Neurological: Denies dizziness, seizures, syncope, weakness, light-headedness, numbness and headaches.  Hematological: Denies adenopathy. Easy bruising, personal or family bleeding history  Psychiatric/Behavioral: Has anxiety or depression, sleeping problems     Physical Exam:    BP 134/78   Pulse 95   Ht _0  (1.549 m)   Wt 169 lb 8 oz (76.9 kg)   BMI 32.03 kg/m  Filed Weights   05/22/18 1511  Weight: 169 lb 8 oz (76.9 kg)   Constitutional:  Well-developed, in no acute distress. Psychiatric: Normal mood and affect. Behavior is normal. HEENT: Pupils normal.  Conjunctivae are normal. No scleral icterus. Neck supple.  Cardiovascular: Normal rate, regular rhythm. No edema Pulmonary/chest: Effort normal and breath sounds normal. No wheezing, rales or rhonchi. Abdominal: Soft, nondistended. LLQ tenderness.  No rebound. Bowel sounds active throughout. There are no masses palpable. No hepatomegaly. Rectal:  defered Neurological: Alert and oriented to person place and time. Skin: Skin  is warm and dry. No rashes noted.  Data Reviewed: I have personally reviewed following labs and imaging studies  CBC: CBC Latest Ref Rng & Units 03/05/2018 08/21/2017 12/19/2016  WBC 4.0 - 10.5 K/uL 6.6 5.4 5.9  Hemoglobin 12.0 - 15.0 g/dL 12.5 12.8 12.6  Hematocrit 36.0 - 46.0 % 39.0 37.9 38.5  Platelets 150 - 400 K/uL 256 259 191    CMP: CMP Latest Ref Rng & Units 03/05/2018 01/23/2018 08/21/2017  Glucose 70 - 99 mg/dL 94 - 96  BUN 6 - 20 mg/dL 7 - 6  Creatinine 0.44 - 1.00 mg/dL 0.85 - 0.73  Sodium 135 - 145 mmol/L 139 - 144  Potassium 3.5 - 5.1 mmol/L 3.5 - 3.9  Chloride 98 - 111 mmol/L 109 - 107(H)  CO2 22 - 32 mmol/L 22 - 20  Calcium 8.9 - 10.3 mg/dL 9.2 - 9.9  Total Protein 6.0 - 8.5 g/dL - 7.7 7.4  Total Bilirubin 0.0 - 1.2 mg/dL - - 0.4  Alkaline Phos 39 - 117 IU/L - - 130(H)  AST 0 - 40 IU/L - - 16  ALT 0 - 32 IU/L - - 12      Carmell Austria, MD 05/22/2018, 3:45 PM  Cc: Azzie Glatter, FNP

## 2018-05-22 NOTE — Telephone Encounter (Signed)
Patient notified

## 2018-05-28 ENCOUNTER — Encounter: Payer: Self-pay | Admitting: Physical Therapy

## 2018-05-28 ENCOUNTER — Ambulatory Visit: Payer: Medicaid Other | Admitting: Physical Therapy

## 2018-05-28 VITALS — BP 138/78 | HR 97

## 2018-05-28 DIAGNOSIS — R262 Difficulty in walking, not elsewhere classified: Secondary | ICD-10-CM

## 2018-05-28 DIAGNOSIS — R42 Dizziness and giddiness: Secondary | ICD-10-CM | POA: Diagnosis not present

## 2018-05-28 DIAGNOSIS — R261 Paralytic gait: Secondary | ICD-10-CM | POA: Diagnosis not present

## 2018-05-28 DIAGNOSIS — R2681 Unsteadiness on feet: Secondary | ICD-10-CM | POA: Diagnosis not present

## 2018-05-28 DIAGNOSIS — R29898 Other symptoms and signs involving the musculoskeletal system: Secondary | ICD-10-CM

## 2018-05-28 DIAGNOSIS — M6281 Muscle weakness (generalized): Secondary | ICD-10-CM | POA: Diagnosis not present

## 2018-05-28 NOTE — Therapy (Addendum)
[facility] [address]  [place], [place], [address] Phone: [phone]   Fax:  [phone]  Physical Therapy Treatment  Patient Details  Name: Brittany [name] MRN: [id number] Date of Birth: 09-Sep-1965 Referring Provider (PT): Kathe Becton, FNP   Encounter Date: 05/28/2018  PT End of Session - 05/28/18 1449    Visit Number  2    Number of Visits  4    Date for PT Re-Evaluation  06/11/18    Authorization Type  Medicaid    Authorization Time Period  05/28/18 - 06/17/18    Authorization - Visit Number  1    Authorization - Number of Visits  3    PT Start Time  1402    PT Stop Time  1445    PT Time Calculation (min)  43 min    Equipment Utilized During Treatment  Gait belt    Activity Tolerance  Patient tolerated treatment well;Patient limited by fatigue    Behavior During Therapy  [facility] for tasks assessed/performed       Past Medical History:  Diagnosis Date  . Anemia   . Elevated cholesterol   . Female bladder prolapse   . Hiatal hernia   . Hypertension   . IBS (irritable bowel syndrome)   . Multiple myeloma ([place])   . Peripheral neuropathy   . Stroke [facility])     Past Surgical History:  Procedure Laterality Date  . BONE BIOPSY    . COLONOSCOPY     over 10 years x3  . ESOPHAGOGASTRODUODENOSCOPY     incomplete-over 10 years ago   . TEE WITHOUT CARDIOVERSION N/A 12/08/2016   Procedure: TRANSESOPHAGEAL ECHOCARDIOGRAM (TEE);  Surgeon: Sueanne Margarita, MD;  [facility]OSCOPY;  Service: Cardiovascular;  Laterality: N/A;  . TUBAL LIGATION    . WISDOM TOOTH EXTRACTION      Vitals:   05/28/18 1405  BP: 138/78  Pulse: 97  SpO2: 97%    Subjective Assessment - 05/28/18 1408    Subjective  Reports that not much is new. Has been getting treated by a GI doctor. Has been performing HEP and no questions on this. 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 Denies dizziness, weakness.    Pertinent  History  stroke, peripheral neuropathy, multiple myeloma, HTN, hiatal hernia, TEE with cardioversion    Diagnostic tests  none recent    Patient Stated Goals  work on leg strength    Currently in Pain?  Yes    Multiple Pain Sites  Yes    Pain Score  8    Pain Location  Wrist    Pain Orientation  Left    Pain Descriptors / Indicators  Sharp    Pain Type  Chronic pain    Pain Radiating Towards  radiating up L arm                       [facility] Adult PT Treatment/Exercise - 05/28/18 0001      Exercises   Exercises  Knee/Hip      Knee/Hip Exercises: Aerobic   Nustep  L1 x 4 min R UE/LEs    took a break at 3 min d/t fatigue     Knee/Hip Exercises: Standing   Terminal Knee Extension  Strengthening;Right;Left;1 set;10 reps    Terminal Knee Extension Limitations  TKE into ball 10x3" with 4WW in front for support      Knee/Hip Exercises: Seated  Long [place] [employer]  Strengthening;Right;Left;1 set;10 reps;Weights    Long Arc [employer]  2 lbs.    Long [employer] Limitations  cues for slow eccentric lower    Hamstring Curl  Strengthening;Right;Left;1 set;10 reps    Hamstring Limitations  blue TB R, green TB L   cues to decrease speed   Sit to Sand  1 set;5 reps;with UE support   pushing off knees            PT Education - 05/28/18 1446    Education Details  update to HEP; administered blue and green TB    Person(s) Educated  Patient    Methods  Explanation;Demonstration;Tactile cues;Verbal cues;Handout    Comprehension  Verbalized understanding;Returned demonstration       PT Short Term Goals - 05/28/18 1451      PT SHORT TERM GOAL #1   Title  Patient to be independent with initial HEP.    Time  1    Period  Weeks    Status  On-going    Target Date  05/28/18        PT Long Term Goals - 05/28/18 1451      PT LONG TERM GOAL #1   Title  Patient to be independent with advanced HEP.    Time  3    Period  Weeks    Status  On-going      PT LONG TERM GOAL  #2   Title  Patient to score B LE strength atleast 3+/5.     Time  3    Period  Weeks    Status  On-going      PT LONG TERM GOAL #3   Title  Patient to improve TUG score by 10 sec in order to make progress towards decreased risk of falls.    Baseline  05/21/18 TUG 25.7 sec    Time  3    Period  Weeks    Status  On-going      PT LONG TERM GOAL #4   Title  Patient to improve 5xSTS score by 10 sec in order to make progress towards decreased risk of falls.    Baseline  05/21/18 5xSTS 28.9 sec with B UE support    Time  3    Period  Weeks    Status  On-going      PT LONG TERM GOAL #5   Title  Patient to report tolerance of standing/walking for 15 min before pain or fatigue limit her.     Time  3    Period  Weeks    Status  On-going      PT LONG TERM GOAL #6   Title  Patient to demonstrate 0 deg of B ankle AROM to decrease risk of falls.     Time  3    Period  Weeks    Status  On-going            Plan - 05/28/18 1449    Clinical Impression Statement  Patient arrived to session with no new complaints. Worked on STS with cues to scoot forward in the seat, place feet wider apart, and lean forward. Patient also requiring cues to utilize breaks on 4WW before beginning ambulation.  Patient able to tolerate LAQ and HS curl with light resistance, however lacking control with these exercises- worse on L LE. Introduced [employer] as patient with visible decrease in quad control in standing- good form, however patient easily fatigued and requiring sitting  rest break in between sets. Updated HEP with LAQ and HS curl as these were well-tolerated today. Patient reported understanding and with no complaints at end of session.     Personal Factors and Comorbidities  Comorbidity 3+;Social Background;Time since onset of injury/illness/exacerbation;Finances    Comorbidities  stroke, peripheral neuropathy, multiple myeloma, HTN, hiatal hernia, TEE with cardioversion    Examination-Activity Limitations   Bathing;Bend;Carry;Squat;Stairs;Stand;Lift;Transfers;Locomotion Level    Examination-Participation Restrictions  Cleaning;Community Activity;Shop;Driving;Yard Work;Interpersonal Relationship;Laundry;Meal Prep    Stability/Clinical Decision Making  Evolving/Moderate complexity    Rehab Potential  Good    PT Frequency  1x / week    PT Duration  3 weeks    PT Treatment/Interventions  ADLs/Self Care Home Management;Cryotherapy;Electrical Stimulation;Functional mobility training;Stair training;Gait training;DME Instruction;Moist Heat;Therapeutic activities;Therapeutic exercise;Balance training;Neuromuscular re-education;Patient/family education;Passive range of motion;Manual techniques;Dry needling;Energy conservation;Splinting;Taping;Orthotic Fit/Training;Vasopneumatic Device;Vestibular    PT Next Visit Plan  progress LE strengthening, address transfers and gait    Consulted and Agree with Plan of Care  Patient       Patient will benefit from skilled therapeutic intervention in order to improve the following deficits and impairments:  Abnormal gait, Decreased endurance, Decreased activity tolerance, Decreased strength, Pain, Decreased balance, Decreased mobility, Difficulty walking, Increased muscle spasms, Improper body mechanics, Decreased range of motion, Decreased safety awareness, Impaired flexibility, Postural dysfunction  Visit Diagnosis: Paralytic gait  Difficulty in walking, not elsewhere classified  Muscle weakness (generalized)  Other symptoms and signs involving the musculoskeletal system     Problem List Patient Active Problem List   Diagnosis Date Noted  . B12 deficiency 12/19/2016  . Chest pain 12/18/2016  . Cerebellar infarct ([place])   . Diastolic dysfunction   . Marijuana abuse   . History of tobacco abuse   . Benign essential HTN   . Tachycardia   . Hypokalemia   . Stroke (cerebrum) ([place]) 12/05/2016  . Nonintractable headache 12/05/2016  . 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 Hypertensive urgency 12/05/2016  . Hypertension      Janene Harvey, PT, DPT 05/28/18 2:54 PM   [facility] [address]  [place], [place], [address] Phone: [phone]   Fax:  [phone]  Name: Brittany [name] MRN: [id number] Date of Birth: Jun 03, 1965

## 2018-05-30 ENCOUNTER — Other Ambulatory Visit: Payer: Self-pay

## 2018-05-30 ENCOUNTER — Encounter (HOSPITAL_BASED_OUTPATIENT_CLINIC_OR_DEPARTMENT_OTHER): Payer: Self-pay

## 2018-05-30 ENCOUNTER — Ambulatory Visit (HOSPITAL_BASED_OUTPATIENT_CLINIC_OR_DEPARTMENT_OTHER)
Admission: RE | Admit: 2018-05-30 | Discharge: 2018-05-30 | Disposition: A | Discharge status: Home / Self Care | Payer: Medicaid Other | Source: Ambulatory Visit | Attending: Gastroenterology | Admitting: Gastroenterology

## 2018-05-30 DIAGNOSIS — R1032 Left lower quadrant pain: Secondary | ICD-10-CM | POA: Insufficient documentation

## 2018-05-30 DIAGNOSIS — K219 Gastro-esophageal reflux disease without esophagitis: Secondary | ICD-10-CM | POA: Diagnosis present

## 2018-05-30 DIAGNOSIS — K581 Irritable bowel syndrome with constipation: Secondary | ICD-10-CM

## 2018-05-30 MED ORDER — IOHEXOL 300 MG/ML  SOLN
100.0000 mL | Freq: Once | INTRAMUSCULAR | Status: AC | PRN
  Administered 2018-05-30: 100 mL via INTRAVENOUS

## 2018-06-04 ENCOUNTER — Other Ambulatory Visit: Payer: Self-pay

## 2018-06-04 ENCOUNTER — Ambulatory Visit: Payer: Medicaid Other | Admitting: Physical Therapy

## 2018-06-04 ENCOUNTER — Telehealth: Payer: Self-pay | Admitting: Neurology

## 2018-06-04 ENCOUNTER — Encounter: Payer: Self-pay | Admitting: Physical Therapy

## 2018-06-04 VITALS — BP 142/78 | HR 97

## 2018-06-04 DIAGNOSIS — R262 Difficulty in walking, not elsewhere classified: Secondary | ICD-10-CM | POA: Diagnosis not present

## 2018-06-04 DIAGNOSIS — R29898 Other symptoms and signs involving the musculoskeletal system: Secondary | ICD-10-CM

## 2018-06-04 DIAGNOSIS — R42 Dizziness and giddiness: Secondary | ICD-10-CM | POA: Diagnosis not present

## 2018-06-04 DIAGNOSIS — R2681 Unsteadiness on feet: Secondary | ICD-10-CM | POA: Diagnosis not present

## 2018-06-04 DIAGNOSIS — R261 Paralytic gait: Secondary | ICD-10-CM | POA: Diagnosis not present

## 2018-06-04 DIAGNOSIS — M6281 Muscle weakness (generalized): Secondary | ICD-10-CM | POA: Diagnosis not present

## 2018-06-04 NOTE — Telephone Encounter (Signed)
Pt is calling in requesting therapy for her arm due to her having a pinch nerve in both her wrist and arm, she doesn't want to do surgery so she was told to call and see if she can get into therapy

## 2018-06-04 NOTE — Therapy (Signed)
[facility] [address]  [place], [place], [address] Phone: [phone]   Fax:  [phone]  Physical Therapy Treatment  Patient Details  Name: Brittany [name] MRN: [id number] Date of Birth: 06-Dec-1965 Referring Provider (PT): Kathe Becton, FNP   Encounter Date: 06/04/2018  PT End of Session - 06/04/18 1458    Visit Number  3    Number of Visits  4    Date for PT Re-Evaluation  06/11/18    Authorization Type  Medicaid    Authorization Time Period  05/28/18 - 06/17/18    Authorization - Visit Number  2    Authorization - Number of Visits  3    PT Start Time  1402    PT Stop Time  1451    PT Time Calculation (min)  49 min    Activity Tolerance  Patient tolerated treatment well;Patient limited by fatigue    Behavior During Therapy  [facility] for tasks assessed/performed       Past Medical History:  Diagnosis Date  . Anemia   . Elevated cholesterol   . Female bladder prolapse   . Hiatal hernia   . Hypertension   . IBS (irritable bowel syndrome)   . Multiple myeloma ([place])   . Peripheral neuropathy   . Stroke [facility])     Past Surgical History:  Procedure Laterality Date  . BONE BIOPSY    . COLONOSCOPY     over 10 years x3  . ESOPHAGOGASTRODUODENOSCOPY     incomplete-over 10 years ago   . TEE WITHOUT CARDIOVERSION N/A 12/08/2016   Procedure: TRANSESOPHAGEAL ECHOCARDIOGRAM (TEE);  Surgeon: Sueanne Margarita, MD;  Location: [facility] ENDOSCOPY;  Service: Cardiovascular;  Laterality: N/A;  . TUBAL LIGATION    . WISDOM TOOTH EXTRACTION      Vitals:   06/04/18 1403 06/04/18 1408  BP: (!) 142/78   Pulse: (!) 102 97  SpO2: 95% 95%    Subjective Assessment - 06/04/18 1406    Subjective  Reports that she is a little sore from doing her HEP at home.     Pertinent History  stroke, peripheral neuropathy, multiple myeloma, HTN, hiatal hernia, TEE with cardioversion    Diagnostic tests  none recent    Patient Stated Goals   work on leg strength    Currently in Pain?  Yes    Pain Score  5     Pain Location  Calf    Pain Orientation  Left;Posterior    Pain Descriptors / Indicators  Aching    Pain Type  Acute pain                       [facility] Adult PT Treatment/Exercise - 06/04/18 0001      Ambulation/Gait   Ambulation Distance (Feet)  180 Feet    Assistive device  4-wheeled walker    Gait Pattern  Step-to pattern;Step-through pattern;Decreased hip/knee flexion - right;Decreased hip/knee flexion - left;Decreased dorsiflexion - right;Decreased dorsiflexion - left;Poor foot clearance - right;Poor foot clearance - left;Shuffle;Right flexed knee in stance;Left flexed knee in stance;Trunk flexed    Ambulation Surface  Level;Indoor    Gait Comments  gait training with 4WW and cues to "stay in the walker," avoid anterior trunk lean, and increase heel strike at initial contract      Knee/Hip Exercises: Aerobic   Nustep  L1 x 6 min (35mn no UEs, 4 min w/ UEs)   break after every  2 min d/t fatigue     Knee/Hip Exercises: Standing   Heel Raises  Both;10 reps;2 sets    Heel Raises Limitations  with 4WW; heavy TCs for L TKE    Functional Squat  1 set;10 reps    Functional Squat Limitations  touching bottom on 2 foam pads with 4WW in front      Knee/Hip Exercises: Seated   Other Seated Knee/Hip Exercises  isometric ab set 10x10" with ball   cues to contract core and maintain rhythmic breathing   Sit to Sand  5 reps;with UE support;2 sets   pushing off knees; cues for glute contraction once standing     Knee/Hip Exercises: Supine   Bridges  Strengthening;Both;10 reps;2 sets    Bridges Limitations  evident hip instabiliy             PT Education - 06/04/18 1457    Education Details  update to HEP; adjustment of walker height    Person(s) Educated  Patient    Methods  Explanation;Demonstration;Tactile cues;Verbal cues;Handout    Comprehension  Returned demonstration;Verbalized understanding        PT Short Term Goals - 06/04/18 1459      PT SHORT TERM GOAL #1   Title  Patient to be independent with initial HEP.    Time  1    Period  Weeks    Status  Achieved    Target Date  05/28/18        PT Long Term Goals - 05/28/18 1451      PT LONG TERM GOAL #1   Title  Patient to be independent with advanced HEP.    Time  3    Period  Weeks    Status  On-going      PT LONG TERM GOAL #2   Title  Patient to score B LE strength atleast 3+/5.     Time  3    Period  Weeks    Status  On-going      PT LONG TERM GOAL #3   Title  Patient to improve TUG score by 10 sec in order to make progress towards decreased risk of falls.    Baseline  05/21/18 TUG 25.7 sec    Time  3    Period  Weeks    Status  On-going      PT LONG TERM GOAL #4   Title  Patient to improve 5xSTS score by 10 sec in order to make progress towards decreased risk of falls.    Baseline  05/21/18 5xSTS 28.9 sec with B UE support    Time  3    Period  Weeks    Status  On-going      PT LONG TERM GOAL #5   Title  Patient to report tolerance of standing/walking for 15 min before pain or fatigue limit her.     Time  3    Period  Weeks    Status  On-going      PT LONG TERM GOAL #6   Title  Patient to demonstrate 0 deg of B ankle AROM to decrease risk of falls.     Time  3    Period  Weeks    Status  On-going            Plan - 06/04/18 1458    Clinical Impression Statement  Patient arrived to session with no new complaints. Able to tolerate increased length of time on NuStep for cardiovascular  warm-up. Worked on STS transfers with patient requiring UE support by pushing from knees, but able to perform increased reps today without fatigue. Limited L quad stability demonstrated with heel raises, which improved with manual cues to contract quad and maintain knee straight. Introduced bridges with patient demonstrating glute weakness and hip instability, but with food effort. Tolerated gait training with 4WW  and cues to "stay in the walker," avoid anterior trunk lean, and improve heel strike at initial contract- patient demonstrated good carryover, but requiring sitting rest break d/t fatigue. Vitals WFL throughout session. Updated HEP with bridge and adjusted walker height to promote upright gait pattern. Patient reported understanding and with no complaints at end of session.     Comorbidities  stroke, peripheral neuropathy, multiple myeloma, HTN, hiatal hernia, TEE with cardioversion    PT Treatment/Interventions  ADLs/Self Care Home Management;Cryotherapy;Electrical Stimulation;Functional mobility training;Stair training;Gait training;DME Instruction;Moist Heat;Therapeutic activities;Therapeutic exercise;Balance training;Neuromuscular re-education;Patient/family education;Passive range of motion;Manual techniques;Dry needling;Energy conservation;Splinting;Taping;Orthotic Fit/Training;Vasopneumatic Device;Vestibular    PT Next Visit Plan  progress LE strengthening, address transfers and gait    Consulted and Agree with Plan of Care  Patient       Patient will benefit from skilled therapeutic intervention in order to improve the following deficits and impairments:  Abnormal gait, Decreased endurance, Decreased activity tolerance, Decreased strength, Pain, Decreased balance, Decreased mobility, Difficulty walking, Increased muscle spasms, Improper body mechanics, Decreased range of motion, Decreased safety awareness, Impaired flexibility, Postural dysfunction  Visit Diagnosis: Paralytic gait  Difficulty in walking, not elsewhere classified  Muscle weakness (generalized)  Other symptoms and signs involving the musculoskeletal system     Problem List Patient Active Problem List   Diagnosis Date Noted  . B12 deficiency 12/19/2016  . Chest pain 12/18/2016  . Cerebellar infarct ([place])   . Diastolic dysfunction   . Marijuana abuse   . History of tobacco abuse   . Benign essential HTN   .  Tachycardia   . Hypokalemia   . Stroke (cerebrum) ([place]) 12/05/2016  . Nonintractable headache 12/05/2016  . Occipital stroke ([place]) 12/05/2016  . Hypertensive urgency 12/05/2016  . Hypertension      Janene Harvey, PT, DPT 06/04/18 3:00 PM   [facility] [address]  [place], [place], [address] Phone: [phone]   Fax:  [phone]  Name: Brittany [name] MRN: [id number] Date of Birth: 12-18-1965

## 2018-06-04 NOTE — Telephone Encounter (Signed)
I called pt back wanting therapy for her carpel tunnel. I stated Dr.Sethi recommend her to wear splints in Dec 2019 and to avoid repetitive movements.I explain therapy is not recommend for carpel tunnel per Dr. Larey Dresser stated she did not want surgery. I stated he recommend splints in 03/06/2018 per our phone call message. Pt states again she cannot afford the splints because she is on disability. I advise pt the splints would help her pain and hand and wrist. Pt still has not brought the splints that were recommend last year. I ask pt if she can ask a family friend or her family to give her the money for the splints. Also to save money per month. I also stated splints in the various stores are not that high. Pt verbalized understanding.

## 2018-06-11 ENCOUNTER — Encounter: Payer: Self-pay | Admitting: Physical Therapy

## 2018-06-11 ENCOUNTER — Ambulatory Visit: Payer: Medicaid Other | Admitting: Physical Therapy

## 2018-06-11 ENCOUNTER — Other Ambulatory Visit: Payer: Self-pay

## 2018-06-11 VITALS — BP 138/78 | HR 97

## 2018-06-11 DIAGNOSIS — R42 Dizziness and giddiness: Secondary | ICD-10-CM | POA: Diagnosis not present

## 2018-06-11 DIAGNOSIS — R262 Difficulty in walking, not elsewhere classified: Secondary | ICD-10-CM

## 2018-06-11 DIAGNOSIS — R2681 Unsteadiness on feet: Secondary | ICD-10-CM | POA: Diagnosis not present

## 2018-06-11 DIAGNOSIS — R29898 Other symptoms and signs involving the musculoskeletal system: Secondary | ICD-10-CM | POA: Diagnosis not present

## 2018-06-11 DIAGNOSIS — M6281 Muscle weakness (generalized): Secondary | ICD-10-CM

## 2018-06-11 DIAGNOSIS — R261 Paralytic gait: Secondary | ICD-10-CM

## 2018-06-11 NOTE — Therapy (Signed)
[facility] [address]  [place], [place], [address] Phone: [phone]   Fax:  [phone]  Physical Therapy Treatment  Patient Details  Name: Brittany [name] MRN: [id number] Date of Birth: June 18, 1965 Referring Provider (PT): Kathe Becton, FNP   Encounter Date: 06/11/2018  PT End of Session - 06/11/18 1027    Visit Number  4    Number of Visits  16    Date for PT Re-Evaluation  08/06/18   starting after clinic reopening   Authorization Type  Medicaid    Authorization Time Period  05/28/18 - 06/17/18    Authorization - Visit Number  3    Authorization - Number of Visits  3    PT Start Time  0906    PT Stop Time  1018    PT Time Calculation (min)  72 min    Equipment Utilized During Treatment  Gait belt    Activity Tolerance  Patient tolerated treatment well;Patient limited by fatigue    Behavior During Therapy  [facility] for tasks assessed/performed       Past Medical History:  Diagnosis Date  . Anemia   . Elevated cholesterol   . Female bladder prolapse   . Hiatal hernia   . Hypertension   . IBS (irritable bowel syndrome)   . Multiple myeloma ([place])   . Peripheral neuropathy   . Stroke [facility])     Past Surgical History:  Procedure Laterality Date  . BONE BIOPSY    . COLONOSCOPY     over 10 years x3  . ESOPHAGOGASTRODUODENOSCOPY     incomplete-over 10 years ago   . TEE WITHOUT CARDIOVERSION N/A 12/08/2016   Procedure: TRANSESOPHAGEAL ECHOCARDIOGRAM (TEE);  Surgeon: Sueanne Margarita, MD;  Location: [facility] ENDOSCOPY;  Service: Cardiovascular;  Laterality: N/A;  . TUBAL LIGATION    . WISDOM TOOTH EXTRACTION      Vitals:   06/11/18 0908  BP: 138/78  Pulse: 97  SpO2: 99%    Subjective Assessment - 06/11/18 0907    Subjective  Reports that she has been having muscle cramps in her calf at night. Reports 50% improvement since starting PT. Was able to walk around the entire grocery store rather than using an  electric scooter. Reports that since starting PT she has noticed more strength in her legs and is able to stand up straighter. Reports improvement in elbow pain since adjusting walker handles last session.     Pertinent History  stroke, peripheral neuropathy, multiple myeloma, HTN, hiatal hernia, TEE with cardioversion    Diagnostic tests  none recent    Patient Stated Goals  work on leg strength    Currently in Pain?  Yes    Pain Score  6     Pain Location  Arm    Pain Orientation  Left    Pain Descriptors / Indicators  Throbbing    Pain Type  Chronic pain    Pain Radiating Towards  from forearm to hand         [facility] PT Assessment - 06/11/18 0001      Assessment   Medical Diagnosis  CVA, B LE pain    Referring Provider (PT)  Kathe Becton, FNP    Onset Date/Surgical Date  12/18/16      AROM   Right Ankle Dorsiflexion  10    Left Ankle Dorsiflexion  6      Strength   Right Hip Flexion  4/5  Right Hip ABduction  4/5    Right Hip ADduction  4-/5    Left Hip Flexion  4/5    Left Hip ABduction  4/5    Left Hip ADduction  4-/5    Right Knee Flexion  4/5    Right Knee Extension  4/5    Left Knee Flexion  4+/5    Left Knee Extension  4/5    Right Ankle Dorsiflexion  3+/5    Right Ankle Plantar Flexion  3+/5    Left Ankle Dorsiflexion  3+/5    Left Ankle Plantar Flexion  3+/5      Standardized Balance Assessment   Standardized Balance Assessment  Five Times Sit to Stand;Timed Up and Go Test    Five times sit to stand comments   23.6 with B UEs on armrests      Timed Up and Go Test   Normal TUG (seconds)  15.5   with 4WW        Vestibular Assessment - 06/11/18 0001      Symptom Behavior   Type of Dizziness   Unsteady with head/body turns;"World moves";"Funny feeling in head"   "dizziness starts in my eyes"   Frequency of Dizziness  intermittent    Symptom Nature  Motion provoked    Aggravating Factors  Activity in general;Walking in a crowd   walking, standing    Relieving Factors  Closing eyes;Rest   sitting   Progression of Symptoms  --   onset since stroke in 2018     Oculomotor Exam   Oculomotor Alignment  Normal    Spontaneous  Absent    Gaze-induced   Absent    Smooth Pursuits  Intact    Saccades  Intact    Comment  convergence intact      Vestibulo-Ocular Reflex   VOR 1 Head Only (x 1 viewing)  dizziness and difficulty with gaze fixation with vertical and horizontal VOR    VOR Cancellation  Unable to maintain gaze    Comment  dizziness and difficulty with gaze fixation with vertical and horizontal VOR cancellation- worse with horizontal               [facility] Adult PT Treatment/Exercise - 06/11/18 0001      Neuro Re-ed    Neuro Re-ed Details   standing reaching for cones with CGA x7 min   c/o dizziness "starting in my eyes"     Knee/Hip Exercises: Stretches   Gastroc Stretch  Right;Left;1 rep;30 seconds    Gastroc Stretch Limitations  standing at counter top with towel roll under toes      Knee/Hip Exercises: Aerobic   Nustep  L1 x 6 min R UE/LEs   break after 5 min     Knee/Hip Exercises: Standing   Heel Raises  Both;10 reps;2 sets    Heel Raises Limitations  at counter top; cues to avoid excess UE support    Functional Squat  1 set;10 reps    Functional Squat Limitations  at counter top; cues to avoid anterior trunk lean    Other Standing Knee Exercises  standing at counter top toe raises x10      Knee/Hip Exercises: Supine   Bridges with Diona Foley Squeeze  Strengthening;Both;1 set;10 reps             PT Education - 06/11/18 1026    Education Details  update and consolidation of HEP; discussion on objective progress with PT thus far; edu on vestibular rehab  and its uses after stroke    Person(s) Educated  Patient    Methods  Explanation;Demonstration;Tactile cues;Verbal cues;Handout    Comprehension  Verbalized understanding;Returned demonstration       PT Short Term Goals - 06/11/18 0917      PT SHORT  TERM GOAL #1   Title  Patient to be independent with initial HEP.    Time  1    Period  Weeks    Status  Achieved    Target Date  05/28/18        PT Long Term Goals - 06/11/18 0917      PT LONG TERM GOAL #1   Title  Patient to be independent with advanced HEP.    Time  6    Period  Weeks    Status  Partially Met   met for current   Target Date  08/06/18      PT LONG TERM GOAL #2   Title  Patient to score B LE strength atleast 3+/5.     Time  6    Period  Weeks    Status  Partially Met   improvements demonstrated in all muscle groups; most limited in B hip adduction, ankle DF, and PF strength   Target Date  08/06/18      PT LONG TERM GOAL #3   Title  Patient to improve TUG score by 10 sec in order to make progress towards decreased risk of falls.    Baseline  05/21/18 TUG 25.7 sec    Time  3    Period  Weeks    Status  Achieved   06/11/18 15.5 sec      PT LONG TERM GOAL #4   Title  Patient to improve 5xSTS score by 10 sec in order to make progress towards decreased risk of falls.    Baseline  05/21/18 5xSTS 28.9 sec with B UE support    Time  6    Period  Weeks    Status  Partially Met   06/11/18 23.6 sec   Target Date  08/06/18      PT LONG TERM GOAL #5   Title  Patient to report tolerance of standing/walking for 15 min before pain or fatigue limit her.     Time  6    Period  Weeks    Status  Partially Met   5 min at this time   Target Date  08/06/18      Additional Long Term Goals   Additional Long Term Goals  Yes      PT LONG TERM GOAL #6   Title  Patient to demonstrate 0 deg of B ankle AROM to decrease risk of falls.     Time  3    Period  Weeks    Status  Achieved      PT LONG TERM GOAL #7   Title  Patient to score <14 sec on TUG with LRAD to decrease risk of falls.     Time  6    Period  Weeks    Status  New    Target Date  08/06/18      PT LONG TERM GOAL #8   Title  Patient to demonstrate 10 deg of B ankle AROM to decrease risk of falls.      Time  6    Period  Weeks    Status  New    Target Date  08/06/18      PT  LONG TERM GOAL  #9   TITLE  Patient to report <2/10 dizziness and mild sway with standing horizontal and vertical head turns while gaze fixed on target.     Time  6    Period  Weeks    Status  New    Target Date  08/06/18            Plan - 06/11/18 1037    Clinical Impression Statement  Patient arrived to session with report of 50% improvement since starting PT, with improvements in LE strength and ability to stand up straighter. Patient also reporting that she was able to walk around the grocery store rather than using a scooter like she usually does. Strength testing revealed improvements in all muscle groups; still most limited in B hip adduction, ankle DF, and PF strength. Patient has met B ankle AROM goal, now reaching neutral. Patient has also met TUG goal, with improvement in her time by 10 sec. Patient has also shown improvement in 5xSTS score. Reports 5 min of standing/walking before having to sit down d/t pain or fatigue. Patient requiring safety cues to lock walker brakes and push up from arm rests with transfers. Able to progress to standing LE strengthening at counter top today with intermittent cues for form and intermittent sitting breaks d/t fatigue. Patient reporting "dizziness starting from my eyes" with standing reaching activities. Oculomotor testing reveled limited gaze stability and dizziness with vertical and horizontal VOR and VOR  cancellation. Updated HEP to address these impairments. Patient reported understanding. Advised patient to perform all standing exercises at counter top and with chair behind for safety. Patient reported understanding. Patient has shown tremendous improvement since starting PT. Updated goals to address remaining deficits as patient is still limited in her functional activity tolerance. Would benefit form skilled PT services 2x/week for 6 weeks, starting after anticipated  re-opening of PT clinic on April 6th.     Comorbidities  stroke, peripheral neuropathy, multiple myeloma, HTN, hiatal hernia, TEE with cardioversion    Rehab Potential  Good    PT Frequency  2x / week    PT Duration  6 weeks    PT Treatment/Interventions  ADLs/Self Care Home Management;Cryotherapy;Electrical Stimulation;Functional mobility training;Stair training;Gait training;DME Instruction;Moist Heat;Therapeutic activities;Therapeutic exercise;Balance training;Neuromuscular re-education;Patient/family education;Passive range of motion;Manual techniques;Dry needling;Energy conservation;Splinting;Taping;Orthotic Fit/Training;Vasopneumatic Device;Vestibular    PT Next Visit Plan  progress LE strengthening, address transfers and gait, work on gaze stabilization     Consulted and Agree with Plan of Care  Patient       Patient will benefit from skilled therapeutic intervention in order to improve the following deficits and impairments:  Abnormal gait, Decreased endurance, Decreased activity tolerance, Decreased strength, Pain, Decreased balance, Decreased mobility, Difficulty walking, Increased muscle spasms, Improper body mechanics, Decreased range of motion, Decreased safety awareness, Impaired flexibility, Postural dysfunction, Dizziness  Visit Diagnosis: Paralytic gait  Difficulty in walking, not elsewhere classified  Muscle weakness (generalized)  Unsteadiness on feet  Dizziness and giddiness  Other symptoms and signs involving the musculoskeletal system     Problem List Patient Active Problem List   Diagnosis Date Noted  . B12 deficiency 12/19/2016  . Chest pain 12/18/2016  . Cerebellar infarct ([place])   . Diastolic dysfunction   . Marijuana abuse   . History of tobacco abuse   . Benign essential HTN   . Tachycardia   . Hypokalemia   . Stroke (cerebrum) ([place]) 12/05/2016  . Nonintractable headache 12/05/2016  . Occipital stroke ([place]) 12/05/2016  .  Hypertensive urgency  12/05/2016  . Hypertension      Janene Harvey, PT, DPT 06/11/18 10:52 AM   [facility] [address]  [place], [place], [address] Phone: [phone]   Fax:  [phone]  Name: Kyung Muto MRN: [id number] Date of Birth: 1965/11/27

## 2018-06-17 ENCOUNTER — Encounter: Payer: Self-pay | Admitting: Physical Medicine & Rehabilitation

## 2018-06-24 ENCOUNTER — Encounter: Payer: Medicaid Other | Admitting: Gastroenterology

## 2018-06-25 ENCOUNTER — Ambulatory Visit: Payer: Medicaid Other

## 2018-07-09 ENCOUNTER — Ambulatory Visit: Payer: Medicaid Other

## 2018-07-11 ENCOUNTER — Other Ambulatory Visit: Payer: Self-pay

## 2018-07-11 ENCOUNTER — Encounter: Payer: Medicaid Other | Attending: Physical Medicine & Rehabilitation | Admitting: Physical Medicine & Rehabilitation

## 2018-07-11 ENCOUNTER — Ambulatory Visit: Payer: Medicaid Other | Attending: Orthopedic Surgery

## 2018-07-11 VITALS — BP 142/78 | HR 101

## 2018-07-11 DIAGNOSIS — R261 Paralytic gait: Secondary | ICD-10-CM | POA: Diagnosis present

## 2018-07-11 DIAGNOSIS — R2681 Unsteadiness on feet: Secondary | ICD-10-CM | POA: Insufficient documentation

## 2018-07-11 DIAGNOSIS — M6281 Muscle weakness (generalized): Secondary | ICD-10-CM

## 2018-07-11 DIAGNOSIS — R42 Dizziness and giddiness: Secondary | ICD-10-CM | POA: Diagnosis present

## 2018-07-11 DIAGNOSIS — R262 Difficulty in walking, not elsewhere classified: Secondary | ICD-10-CM | POA: Insufficient documentation

## 2018-07-11 DIAGNOSIS — R29898 Other symptoms and signs involving the musculoskeletal system: Secondary | ICD-10-CM | POA: Diagnosis present

## 2018-07-11 NOTE — Therapy (Signed)
[facility] [address]  [place], [place], [address] Phone: [phone]   Fax:  [phone]  Physical Therapy Treatment  Patient Details  Name: Brittany [name] MRN: [id number] Date of Birth: Mar 17, 1966 Referring Provider (PT): Kathe Becton, FNP   Encounter Date: 07/11/2018  PT End of Session - 07/11/18 0937    Visit Number  5    Number of Visits  16    Date for PT Re-Evaluation  08/06/18   starting after clinic reopening   Authorization Type  Medicaid    Authorization Time Period  06/25/18 - 08/05/18    Authorization - Visit Number  1    Authorization - Number of Visits  12    PT Start Time  0930    PT Stop Time  1017    PT Time Calculation (min)  47 min    Equipment Utilized During Treatment  --    Activity Tolerance  Patient tolerated treatment well;Patient limited by fatigue    Behavior During Therapy  [facility] for tasks assessed/performed       Past Medical History:  Diagnosis Date  . Anemia   . Elevated cholesterol   . Female bladder prolapse   . Hiatal hernia   . Hypertension   . IBS (irritable bowel syndrome)   . Multiple myeloma ([place])   . Peripheral neuropathy   . Stroke [facility])     Past Surgical History:  Procedure Laterality Date  . BONE BIOPSY    . COLONOSCOPY     over 10 years x3  . ESOPHAGOGASTRODUODENOSCOPY     incomplete-over 10 years ago   . TEE WITHOUT CARDIOVERSION N/A 12/08/2016   Procedure: TRANSESOPHAGEAL ECHOCARDIOGRAM (TEE);  Surgeon: Sueanne Margarita, MD;  Location: [facility] ENDOSCOPY;  Service: Cardiovascular;  Laterality: N/A;  . TUBAL LIGATION    . WISDOM TOOTH EXTRACTION      Vitals:   07/11/18 0937 07/11/18 1150  BP: (!) 142/78   Pulse: 98 (!) 101    Subjective Assessment - 07/11/18 0937    Subjective  Pt. reporting no issues with updated HEP.    Pertinent History  stroke, peripheral neuropathy, multiple myeloma, HTN, hiatal hernia, TEE with cardioversion    Patient Stated  Goals  work on leg strength    Currently in Pain?  No/denies    Pain Score  --    Multiple Pain Sites  No    Pain Score  3    Pain Location  Wrist    Pain Orientation  Left    Pain Descriptors / Indicators  Sharp    Pain Type  Chronic pain                       [facility] Adult PT Treatment/Exercise - 07/11/18 0954      Transfers   Transfers  Sit to Stand;Stand to Sit    Sit to Stand  6: Modified independent (Device/Increase time);5: Supervision   cues proper for proper hand placement    Stand to Sit  6: Modified independent (Device/Increase time);5: Supervision   cues provided for proper hand placement      Knee/Hip Exercises: Stretches   Gastroc Stretch  Right;Left;1 rep;30 seconds    Gastroc Stretch Limitations  standing at [employer] rail       Knee/Hip Exercises: Aerobic   Nustep  L1 x 6 min R LEs only   HR rising to 101bpm at end of 6 min  Knee/Hip Exercises: Standing   Heel Raises  Both;15 reps    Heel Raises Limitations  at TM rail     Knee Flexion  Right;Left;10 reps;Strengthening    Knee Flexion Limitations  2#    Functional Squat  1 set;3 seconds   12 reps - seated rest break taken after per pt.    Functional Squat Limitations  at TM rail     Other Standing Knee Exercises  standing at counter top toe raises x 15   seated rest break taken after this set     Knee/Hip Exercises: Seated   Long Arc Quad  Right;Left    Sit to [employer]  10 reps;without UE support               PT Short Term Goals - 06/11/18 0917      PT SHORT TERM GOAL #1   Title  Patient to be independent with initial HEP.    Time  1    Period  Weeks    Status  Achieved    Target Date  05/28/18        PT Long Term Goals - 06/11/18 0917      PT LONG TERM GOAL #1   Title  Patient to be independent with advanced HEP.    Time  6    Period  Weeks    Status  Partially Met   met for current   Target Date  08/06/18      PT LONG TERM GOAL #2   Title  Patient to score B LE  strength atleast 3+/5.     Time  6    Period  Weeks    Status  Partially Met   improvements demonstrated in all muscle groups; most limited in B hip adduction, ankle DF, and PF strength   Target Date  08/06/18      PT LONG TERM GOAL #3   Title  Patient to improve TUG score by 10 sec in order to make progress towards decreased risk of falls.    Baseline  05/21/18 TUG 25.7 sec    Time  3    Period  Weeks    Status  Achieved   06/11/18 15.5 sec      PT LONG TERM GOAL #4   Title  Patient to improve 5xSTS score by 10 sec in order to make progress towards decreased risk of falls.    Baseline  05/21/18 5xSTS 28.9 sec with B UE support    Time  6    Period  Weeks    Status  Partially Met   06/11/18 23.6 sec   Target Date  08/06/18      PT LONG TERM GOAL #5   Title  Patient to report tolerance of standing/walking for 15 min before pain or fatigue limit her.     Time  6    Period  Weeks    Status  Partially Met   5 min at this time   Target Date  08/06/18      Additional Long Term Goals   Additional Long Term Goals  Yes      PT LONG TERM GOAL #6   Title  Patient to demonstrate 0 deg of B ankle AROM to decrease risk of falls.     Time  3    Period  Weeks    Status  Achieved      PT LONG TERM GOAL #7   Title  Patient to  score <14 sec on TUG with LRAD to decrease risk of falls.     Time  6    Period  Weeks    Status  New    Target Date  08/06/18      PT LONG TERM GOAL #8   Title  Patient to demonstrate 10 deg of B ankle AROM to decrease risk of falls.     Time  6    Period  Weeks    Status  New    Target Date  08/06/18      PT LONG TERM GOAL  #9   TITLE  Patient to report <2/10 dizziness and mild sway with standing horizontal and vertical head turns while gaze fixed on target.     Time  6    Period  Weeks    Status  New    Target Date  08/06/18            Plan - 07/11/18 1151    Clinical Impression Statement  Janah reporting she has been doing well.   returning to outpatient rehab after ~ 4 weeks due to concerns for COVID-19.  Reports she has been performing HEP daily and able to demo good overall technique and effort with review today.  Able to progress repetitions with sit<>stand, heel raise, and toe raise with only fatigue noted today.  Did require occasional sitting rest breaks.  Progressing well toward goals.      Personal Factors and Comorbidities  Comorbidity 3+;Social Background;Time since onset of injury/illness/exacerbation;Finances    Comorbidities  stroke, peripheral neuropathy, multiple myeloma, HTN, hiatal hernia, TEE with cardioversion    Examination-Activity Limitations  Bathing;Bend;Carry;Squat;Stairs;Stand;Lift;Transfers;Locomotion Level    Examination-Participation Restrictions  Cleaning;Community Activity;Shop;Driving;Yard Work;Interpersonal Relationship;Laundry;Meal Prep    PT Treatment/Interventions  ADLs/Self Care Home Management;Cryotherapy;Electrical Stimulation;Functional mobility training;Stair training;Gait training;DME Instruction;Moist Heat;Therapeutic activities;Therapeutic exercise;Balance training;Neuromuscular re-education;Patient/family education;Passive range of motion;Manual techniques;Dry needling;Energy conservation;Splinting;Taping;Orthotic Fit/Training;Vasopneumatic Device;Vestibular    PT Next Visit Plan  progress LE strengthening, work on gaze stabilization     Consulted and Agree with Plan of Care  Patient       Patient will benefit from skilled therapeutic intervention in order to improve the following deficits and impairments:  Abnormal gait, Decreased endurance, Decreased activity tolerance, Decreased strength, Pain, Decreased balance, Decreased mobility, Difficulty walking, Increased muscle spasms, Improper body mechanics, Decreased range of motion, Decreased safety awareness, Impaired flexibility, Postural dysfunction, Dizziness  Visit Diagnosis: Paralytic gait  Difficulty in walking, not elsewhere  classified  Muscle weakness (generalized)  Unsteadiness on feet  Dizziness and giddiness  Other symptoms and signs involving the musculoskeletal system     Problem List Patient Active Problem List   Diagnosis Date Noted  . B12 deficiency 12/19/2016  . Chest pain 12/18/2016  . Cerebellar infarct ([place])   . Diastolic dysfunction   . Marijuana abuse   . History of tobacco abuse   . Benign essential HTN   . Tachycardia   . Hypokalemia   . Stroke (cerebrum) ([place]) 12/05/2016  . Nonintractable headache 12/05/2016  . Occipital stroke ([place]) 12/05/2016  . Hypertensive urgency 12/05/2016  . Hypertension     Bess Harvest, [place] 07/11/18 11:56 AM   [facility] [address]  [place], [place], [address] Phone: [phone]   Fax:  [phone]  Name: Timiko Offutt MRN: [id number] Date of Birth: 1965/08/07

## 2018-07-15 ENCOUNTER — Ambulatory Visit: Payer: Medicaid Other | Admitting: Physical Therapy

## 2018-07-18 ENCOUNTER — Other Ambulatory Visit: Payer: Self-pay

## 2018-07-18 ENCOUNTER — Ambulatory Visit: Payer: Medicaid Other

## 2018-07-18 ENCOUNTER — Other Ambulatory Visit: Payer: Self-pay | Admitting: Family Medicine

## 2018-07-18 VITALS — BP 146/84 | HR 87

## 2018-07-18 DIAGNOSIS — R29898 Other symptoms and signs involving the musculoskeletal system: Secondary | ICD-10-CM | POA: Diagnosis not present

## 2018-07-18 DIAGNOSIS — E876 Hypokalemia: Secondary | ICD-10-CM

## 2018-07-18 DIAGNOSIS — R262 Difficulty in walking, not elsewhere classified: Secondary | ICD-10-CM

## 2018-07-18 DIAGNOSIS — M6281 Muscle weakness (generalized): Secondary | ICD-10-CM

## 2018-07-18 DIAGNOSIS — R2681 Unsteadiness on feet: Secondary | ICD-10-CM

## 2018-07-18 DIAGNOSIS — R351 Nocturia: Secondary | ICD-10-CM | POA: Diagnosis not present

## 2018-07-18 DIAGNOSIS — R42 Dizziness and giddiness: Secondary | ICD-10-CM | POA: Diagnosis not present

## 2018-07-18 DIAGNOSIS — R261 Paralytic gait: Secondary | ICD-10-CM | POA: Diagnosis not present

## 2018-07-18 DIAGNOSIS — N3946 Mixed incontinence: Secondary | ICD-10-CM | POA: Diagnosis not present

## 2018-07-18 NOTE — Therapy (Signed)
[facility] [address]  [place], [place], [address] Phone: [phone]   Fax:  [phone]  Physical Therapy Treatment  Patient Details  Name: Brittany [name] MRN: [id number] Date of Birth: [birth year] Referring Provider (PT): Kathe Becton, FNP   Encounter Date: 07/18/2018  PT End of Session - 07/18/18 1026    Visit Number  6    Number of Visits  16    Date for PT Re-Evaluation  08/06/18   starting after clinic reopening   Authorization Type  Medicaid    Authorization Time Period  06/25/18 - 08/05/18    Authorization - Visit Number  2    Authorization - Number of Visits  12    PT Start Time  1016    PT Stop Time  1100    PT Time Calculation (min)  44 min    Activity Tolerance  Patient tolerated treatment well;Patient limited by fatigue    Behavior During Therapy  [facility] for tasks assessed/performed       Past Medical History:  Diagnosis Date  . Anemia   . Elevated cholesterol   . Female bladder prolapse   . Hiatal hernia   . Hypertension   . IBS (irritable bowel syndrome)   . Multiple myeloma ([place])   . Peripheral neuropathy   . Stroke [facility])     Past Surgical History:  Procedure Laterality Date  . BONE BIOPSY    . COLONOSCOPY     over 10 years x3  . ESOPHAGOGASTRODUODENOSCOPY     incomplete-over 10 years ago   . TEE WITHOUT CARDIOVERSION N/A 12/08/2016   Procedure: TRANSESOPHAGEAL ECHOCARDIOGRAM (TEE);  Surgeon: Sueanne Margarita, MD;  Location: [facility] ENDOSCOPY;  Service: Cardiovascular;  Laterality: N/A;  . TUBAL LIGATION    . WISDOM TOOTH EXTRACTION      Vitals:   07/18/18 1024 07/18/18 1208  BP: (!) 156/90 (!) 146/84  Pulse: 91 87  SpO2: 99% 97%    Subjective Assessment - 07/18/18 1024    Subjective  Pt. noting R hip soreness on Sunday and Monday and attributes this to overdoing HEP.  Notes, "I do have a history of R hip bursitis".      Pertinent History  stroke, peripheral neuropathy, multiple  myeloma, HTN, hiatal hernia, TEE with cardioversion    How long can you stand comfortably?  8 min limited by pain and fatigue     How long can you walk comfortably?  10 min     Diagnostic tests  none recent    Patient Stated Goals  work on leg strength    Currently in Pain?  No/denies    Pain Score  0-No pain   pain up to 10/10 at R side at worst on Sunday and Monday   Pain Location  Hip    Pain Orientation  Right    Pain Descriptors / Indicators  Aching    Pain Type  Chronic pain    Multiple Pain Sites  No                       [facility] Adult PT Treatment/Exercise - 07/18/18 1040      Transfers   Transfers  Sit to Stand;Stand to Sit    Sit to Stand  6: Modified independent (Device/Increase time);5: Supervision    Sit to Stand Details  Visual cues for safe use of DME/AE    Stand to Sit  6: Modified independent (  Device/Increase time);5: Supervision    Stand to Sit Details (indicate cue type and reason)  Verbal cues for sequencing      Self-Care   Self-Care  Other Self-Care Comments    Other Self-Care Comments   Discussed current HEP to check for tolerance as it relates to reported R hip pain which she attributes to HEP performance on Sunday and Monday of this weeek       Knee/Hip Exercises: Aerobic   Nustep  L4 x 6 min (B UE/LE) - tolerated well       Knee/Hip Exercises: Standing   Heel Raises  Both;15 reps    Heel Raises Limitations  at counter     Functional Squat  1 set;3 seconds   x 12 reps to chair + airex pad   Functional Squat Limitations  at counter     Other Standing Knee Exercises  standing at counter top toe raises x 15      Knee/Hip Exercises: Seated   Long Arc Quad  Right;Left;15 reps    Long Arc Quad Weight  2 lbs.    Sit to Sand  10 reps;with UE support   minor use of UE; focusing on anterior wt. shift      Knee/Hip Exercises: Supine   Bridges with Diona Foley Squeeze  Both;15 reps;1 set;Strengthening   3" hold - cues required to prevent pt. from  holding breath              PT Short Term Goals - 06/11/18 0917      PT SHORT TERM GOAL #1   Title  Patient to be independent with initial HEP.    Time  1    Period  Weeks    Status  Achieved    Target Date  05/28/18        PT Long Term Goals - 06/11/18 0917      PT LONG TERM GOAL #1   Title  Patient to be independent with advanced HEP.    Time  6    Period  Weeks    Status  Partially Met   met for current   Target Date  08/06/18      PT LONG TERM GOAL #2   Title  Patient to score B LE strength atleast 3+/5.     Time  6    Period  Weeks    Status  Partially Met   improvements demonstrated in all muscle groups; most limited in B hip adduction, ankle DF, and PF strength   Target Date  08/06/18      PT LONG TERM GOAL #3   Title  Patient to improve TUG score by 10 sec in order to make progress towards decreased risk of falls.    Baseline  05/21/18 TUG 25.7 sec    Time  3    Period  Weeks    Status  Achieved   06/11/18 15.5 sec      PT LONG TERM GOAL #4   Title  Patient to improve 5xSTS score by 10 sec in order to make progress towards decreased risk of falls.    Baseline  05/21/18 5xSTS 28.9 sec with B UE support    Time  6    Period  Weeks    Status  Partially Met   06/11/18 23.6 sec   Target Date  08/06/18      PT LONG TERM GOAL #5   Title  Patient to report tolerance of standing/walking for 15  min before pain or fatigue limit her.     Time  6    Period  Weeks    Status  Partially Met   5 min at this time   Target Date  08/06/18      Additional Long Term Goals   Additional Long Term Goals  Yes      PT LONG TERM GOAL #6   Title  Patient to demonstrate 0 deg of B ankle AROM to decrease risk of falls.     Time  3    Period  Weeks    Status  Achieved      PT LONG TERM GOAL #7   Title  Patient to score <14 sec on TUG with LRAD to decrease risk of falls.     Time  6    Period  Weeks    Status  New    Target Date  08/06/18      PT LONG TERM  GOAL #8   Title  Patient to demonstrate 10 deg of B ankle AROM to decrease risk of falls.     Time  6    Period  Weeks    Status  New    Target Date  08/06/18      PT LONG TERM GOAL  #9   TITLE  Patient to report <2/10 dizziness and mild sway with standing horizontal and vertical head turns while gaze fixed on target.     Time  6    Period  Weeks    Status  New    Target Date  08/06/18            Plan - 07/18/18 1047    Clinical Impression Statement  Brittany [name] doing well today.  Reports she did have R hip pain on Sunday and Monday and attributes this to possibly, "overdoing housework and home program".  Does note, "I have a history of having R hip bursitis and it felt like that".  Notes she feels this pain has self-resolved today.  Did spend some time in session today reviewing home program to check for tolerance and unable to reproduce R hip pain in session.  Tolerated all LE strengthening activities well today.  Required cueing for proper/safe use of DME + proper hand placement today for safety however able to demo good carryover of instruction following this.  Ended visit pain free.      Personal Factors and Comorbidities  Comorbidity 3+;Social Background;Time since onset of injury/illness/exacerbation;Finances    Comorbidities  stroke, peripheral neuropathy, multiple myeloma, HTN, hiatal hernia, TEE with cardioversion    Examination-Activity Limitations  Bathing;Bend;Carry;Squat;Stairs;Stand;Lift;Transfers;Locomotion Level    Examination-Participation Restrictions  Cleaning;Community Activity;Shop;Driving;Yard Work;Interpersonal Relationship;Laundry;Meal Prep    Rehab Potential  Good    PT Treatment/Interventions  ADLs/Self Care Home Management;Cryotherapy;Electrical Stimulation;Functional mobility training;Stair training;Gait training;DME Instruction;Moist Heat;Therapeutic activities;Therapeutic exercise;Balance training;Neuromuscular re-education;Patient/family education;Passive range  of motion;Manual techniques;Dry needling;Energy conservation;Splinting;Taping;Orthotic Fit/Training;Vasopneumatic Device;Vestibular    PT Next Visit Plan  progress LE strengthening, work on gaze stabilization     Consulted and Agree with Plan of Care  Patient       Patient will benefit from skilled therapeutic intervention in order to improve the following deficits and impairments:  Abnormal gait, Decreased endurance, Decreased activity tolerance, Decreased strength, Pain, Decreased balance, Decreased mobility, Difficulty walking, Increased muscle spasms, Improper body mechanics, Decreased range of motion, Decreased safety awareness, Impaired flexibility, Postural dysfunction, Dizziness  Visit Diagnosis: Paralytic gait  Difficulty in walking, not elsewhere classified  Muscle weakness (  generalized)  Unsteadiness on feet  Dizziness and giddiness     Problem List Patient Active Problem List   Diagnosis Date Noted  . B12 deficiency 12/19/2016  . Chest pain 12/18/2016  . Cerebellar infarct ([place])   . Diastolic dysfunction   . Marijuana abuse   . History of tobacco abuse   . Benign essential HTN   . Tachycardia   . Hypokalemia   . Stroke (cerebrum) ([place]) 12/05/2016  . Nonintractable headache 12/05/2016  . Occipital stroke ([place]) 12/05/2016  . Hypertensive urgency 12/05/2016  . Hypertension     Brittany [name], [place] 07/18/18 12:17 PM    [facility] [address]  [place], [place], [address] Phone: [phone]   Fax:  [phone]  Name: Brittany [name] MRN: [id number] Date of Birth: 03/09/1966

## 2018-07-23 ENCOUNTER — Ambulatory Visit: Payer: Medicaid Other | Attending: Orthopedic Surgery

## 2018-07-23 ENCOUNTER — Other Ambulatory Visit: Payer: Self-pay

## 2018-07-23 DIAGNOSIS — R29898 Other symptoms and signs involving the musculoskeletal system: Secondary | ICD-10-CM

## 2018-07-23 DIAGNOSIS — R261 Paralytic gait: Secondary | ICD-10-CM | POA: Insufficient documentation

## 2018-07-23 DIAGNOSIS — R262 Difficulty in walking, not elsewhere classified: Secondary | ICD-10-CM | POA: Diagnosis not present

## 2018-07-23 DIAGNOSIS — M6281 Muscle weakness (generalized): Secondary | ICD-10-CM | POA: Diagnosis not present

## 2018-07-23 DIAGNOSIS — R2681 Unsteadiness on feet: Secondary | ICD-10-CM | POA: Insufficient documentation

## 2018-07-23 DIAGNOSIS — R42 Dizziness and giddiness: Secondary | ICD-10-CM

## 2018-07-23 NOTE — Therapy (Signed)
[facility] [place] [address]  [place], [place], [address] Phone: [phone]   Fax:  [phone]  Physical Therapy Treatment  Patient Details  Name: Brittany [name] MRN: [id number] Date of Birth: [birth year] Referring Provider (PT): Kathe Becton, FNP   Encounter Date: 07/23/2018  PT End of Session - 07/23/18 1025    Visit Number  7    Number of Visits  16    Date for PT Re-Evaluation  08/06/18   starting after clinic reopening   Authorization Type  Medicaid    Authorization Time Period  06/25/18 - 08/05/18    Authorization - Visit Number  3    Authorization - Number of Visits  12    PT Start Time  1019    PT Stop Time  1100    PT Time Calculation (min)  41 min    Activity Tolerance  Patient tolerated treatment well;Patient limited by fatigue    Behavior During Therapy  [facility] for tasks assessed/performed       Past Medical History:  Diagnosis Date  . Anemia   . Elevated cholesterol   . Female bladder prolapse   . Hiatal hernia   . Hypertension   . IBS (irritable bowel syndrome)   . Multiple myeloma ([place])   . Peripheral neuropathy   . Stroke [facility])     Past Surgical History:  Procedure Laterality Date  . BONE BIOPSY    . COLONOSCOPY     over 10 years x3  . ESOPHAGOGASTRODUODENOSCOPY     incomplete-over 10 years ago   . TEE WITHOUT CARDIOVERSION N/A 12/08/2016   Procedure: TRANSESOPHAGEAL ECHOCARDIOGRAM (TEE);  Surgeon: Sueanne Margarita, MD;  [facility]: [facility] ENDOSCOPY;  Service: Cardiovascular;  Laterality: N/A;  . TUBAL LIGATION    . WISDOM TOOTH EXTRACTION      There were no vitals filed for this visit.  Subjective Assessment - 07/23/18 1026    Subjective  Pt. reporting she has been performing HEP daily.  Notes R hip pain is no longer bothering her.      Pertinent History  stroke, peripheral neuropathy, multiple myeloma, HTN, hiatal hernia, TEE with cardioversion    How long can you stand comfortably?  8 min  limited by pain and fatigue     Patient Stated Goals  work on leg strength    Currently in Pain?  No/denies    Pain Score  0-No pain    Multiple Pain Sites  No                       [facility] Adult PT Treatment/Exercise - 07/23/18 0001      Knee/Hip Exercises: Aerobic   Nustep  L4 x 7 min (B UE/LE) - tolerated well       Knee/Hip Exercises: Standing   Heel Raises  Both;20 reps    Heel Raises Limitations  in RW    Hip Abduction  Left;Right;5 reps;2 sets;Knee straight;Stengthening   2 sets of 5 rpes    Abduction Limitations  yellow TB at ankles; holding onto RW    Hip Extension  Right;Left;5 reps;Knee straight;2 sets;Stengthening    Extension Limitations  yellow TB at ankles; RW support    Functional Squat  1 set;3 seconds   x 13 reps to airex pad on mat table    Functional Squat Limitations  in RW with less UE support     Other Standing Knee Exercises  standing at  RW toe raises x 15      Knee/Hip Exercises: Seated   Long Arc Quad  Right;Left;15 reps    Long Arc Quad Limitations  yellow TB at ankle              PT Education - 07/23/18 1508    Education Details  HEP update; yellow looped TB issued to pt.     Person(s) Educated  Patient    Methods  Explanation;Demonstration;Verbal cues;Handout    Comprehension  Need further instruction;Verbalized understanding;Returned demonstration;Verbal cues required       PT Short Term Goals - 06/11/18 0917      PT SHORT TERM GOAL #1   Title  Patient to be independent with initial HEP.    Time  1    Period  Weeks    Status  Achieved    Target Date  05/28/18        PT Long Term Goals - 06/11/18 0917      PT LONG TERM GOAL #1   Title  Patient to be independent with advanced HEP.    Time  6    Period  Weeks    Status  Partially Met   met for current   Target Date  08/06/18      PT LONG TERM GOAL #2   Title  Patient to score B LE strength atleast 3+/5.     Time  6    Period  Weeks    Status  Partially Met    improvements demonstrated in all muscle groups; most limited in B hip adduction, ankle DF, and PF strength   Target Date  08/06/18      PT LONG TERM GOAL #3   Title  Patient to improve TUG score by 10 sec in order to make progress towards decreased risk of falls.    Baseline  05/21/18 TUG 25.7 sec    Time  3    Period  Weeks    Status  Achieved   06/11/18 15.5 sec      PT LONG TERM GOAL #4   Title  Patient to improve 5xSTS score by 10 sec in order to make progress towards decreased risk of falls.    Baseline  05/21/18 5xSTS 28.9 sec with B UE support    Time  6    Period  Weeks    Status  Partially Met   06/11/18 23.6 sec   Target Date  08/06/18      PT LONG TERM GOAL #5   Title  Patient to report tolerance of standing/walking for 15 min before pain or fatigue limit her.     Time  6    Period  Weeks    Status  Partially Met   5 min at this time   Target Date  08/06/18      Additional Long Term Goals   Additional Long Term Goals  Yes      PT LONG TERM GOAL #6   Title  Patient to demonstrate 0 deg of B ankle AROM to decrease risk of falls.     Time  3    Period  Weeks    Status  Achieved      PT LONG TERM GOAL #7   Title  Patient to score <14 sec on TUG with LRAD to decrease risk of falls.     Time  6    Period  Weeks    Status  New    Target  Date  08/06/18      PT LONG TERM GOAL #8   Title  Patient to demonstrate 10 deg of B ankle AROM to decrease risk of falls.     Time  6    Period  Weeks    Status  New    Target Date  08/06/18      PT LONG TERM GOAL  #9   TITLE  Patient to report <2/10 dizziness and mild sway with standing horizontal and vertical head turns while gaze fixed on target.     Time  6    Period  Weeks    Status  New    Target Date  08/06/18            Plan - 07/23/18 1027    Clinical Impression Statement  Notes R hip pain is no longer bothering her since last week.  Pt. reporting she has been performing HEP daily.  Tolerated  progression of standing proximal hip strengthening activities well today.  Demonstrating improved control with sit<>stand activity and verbalized she noticed improvement in strength at home.  Progressing well toward goals.      Personal Factors and Comorbidities  Comorbidity 3+;Social Background;Time since onset of injury/illness/exacerbation;Finances    Comorbidities  stroke, peripheral neuropathy, multiple myeloma, HTN, hiatal hernia, TEE with cardioversion    Examination-Activity Limitations  Bathing;Bend;Carry;Squat;Stairs;Stand;Lift;Transfers;Locomotion Level    Examination-Participation Restrictions  Cleaning;Community Activity;Shop;Driving;Yard Work;Interpersonal Relationship;Laundry;Meal Prep    PT Treatment/Interventions  ADLs/Self Care Home Management;Cryotherapy;Electrical Stimulation;Functional mobility training;Stair training;Gait training;DME Instruction;Moist Heat;Therapeutic activities;Therapeutic exercise;Balance training;Neuromuscular re-education;Patient/family education;Passive range of motion;Manual techniques;Dry needling;Energy conservation;Splinting;Taping;Orthotic Fit/Training;Vasopneumatic Device;Vestibular    PT Next Visit Plan  progress LE strengthening, work on gaze stabilization     Consulted and Agree with Plan of Care  Patient       Patient will benefit from skilled therapeutic intervention in order to improve the following deficits and impairments:  Abnormal gait, Decreased endurance, Decreased activity tolerance, Decreased strength, Pain, Decreased balance, Decreased mobility, Difficulty walking, Increased muscle spasms, Improper body mechanics, Decreased range of motion, Decreased safety awareness, Impaired flexibility, Postural dysfunction, Dizziness  Visit Diagnosis: Paralytic gait  Difficulty in walking, not elsewhere classified  Muscle weakness (generalized)  Unsteadiness on feet  Dizziness and giddiness  Other symptoms and signs involving the  musculoskeletal system     Problem List Patient Active Problem List   Diagnosis Date Noted  . B12 deficiency 12/19/2016  . Chest pain 12/18/2016  . Cerebellar infarct ([place])   . Diastolic dysfunction   . Marijuana abuse   . History of tobacco abuse   . Benign essential HTN   . Tachycardia   . Hypokalemia   . Stroke (cerebrum) ([place]) 12/05/2016  . Nonintractable headache 12/05/2016  . Occipital stroke ([place]) 12/05/2016  . Hypertensive urgency 12/05/2016  . Hypertension     Bess Harvest, [place] 07/23/18 3:12 PM   [facility] [address]  [place], [place], [address] Phone: [phone]   Fax:  [phone]  Name: Brittany [name] MRN: [id number] Date of Birth: 03-23-1965

## 2018-07-24 ENCOUNTER — Other Ambulatory Visit: Payer: Self-pay

## 2018-07-24 MED ORDER — AMLODIPINE BESYLATE 10 MG PO TABS: 10.0000 mg | ORAL_TABLET | Freq: Every day | ORAL | 2 refills | Status: DC

## 2018-07-24 NOTE — Telephone Encounter (Signed)
Medication sent to pharmacy  

## 2018-07-25 ENCOUNTER — Ambulatory Visit: Payer: Medicaid Other | Admitting: Physical Therapy

## 2018-07-25 ENCOUNTER — Encounter: Payer: Self-pay | Admitting: Physical Therapy

## 2018-07-25 ENCOUNTER — Other Ambulatory Visit: Payer: Self-pay

## 2018-07-25 VITALS — BP 136/78 | HR 99

## 2018-07-25 DIAGNOSIS — M6281 Muscle weakness (generalized): Secondary | ICD-10-CM

## 2018-07-25 DIAGNOSIS — R29898 Other symptoms and signs involving the musculoskeletal system: Secondary | ICD-10-CM | POA: Diagnosis not present

## 2018-07-25 DIAGNOSIS — R42 Dizziness and giddiness: Secondary | ICD-10-CM

## 2018-07-25 DIAGNOSIS — R2681 Unsteadiness on feet: Secondary | ICD-10-CM

## 2018-07-25 DIAGNOSIS — R262 Difficulty in walking, not elsewhere classified: Secondary | ICD-10-CM

## 2018-07-25 DIAGNOSIS — R261 Paralytic gait: Secondary | ICD-10-CM | POA: Diagnosis not present

## 2018-07-25 NOTE — Telephone Encounter (Signed)
Message sent to provider 

## 2018-07-25 NOTE — Therapy (Signed)
[facility] [address]  [address] [place], [place], [address] Phone: [phone]   Fax:  [phone]  Physical Therapy Treatment  Patient Details  Name: Brittany [name] MRN: [id number] Date of Birth: 12/03/1965 Referring Provider (PT): Natalie Stroud, FNP   Encounter Date: 07/25/2018  PT End of Session - 07/25/18 1056    Visit Number  8    Number of Visits  16    Date for PT Re-Evaluation  08/06/18   starting after clinic reopening   Authorization Type  Medicaid    Authorization Time Period  06/25/18 - 08/05/18    Authorization - Visit Number  4    Authorization - Number of Visits  12    PT Start Time  1014    PT Stop Time  1055    PT Time Calculation (min)  41 min    Equipment Utilized During Treatment  Gait belt    Activity Tolerance  Patient tolerated treatment well;Patient limited by fatigue    Behavior During Therapy  WFL for tasks assessed/performed       Past Medical History:  Diagnosis Date  . Anemia   . Elevated cholesterol   . Female bladder prolapse   . Hiatal hernia   . Hypertension   . IBS (irritable bowel syndrome)   . Multiple myeloma ([facility])   . Peripheral neuropathy   . Stroke ([facility])     Past Surgical History:  Procedure Laterality Date  . BONE BIOPSY    . COLONOSCOPY     over 10 years x3  . ESOPHAGOGASTRODUODENOSCOPY     incomplete-over 10 years ago   . TEE WITHOUT CARDIOVERSION N/A 12/08/2016   Procedure: TRANSESOPHAGEAL ECHOCARDIOGRAM (TEE);  Surgeon: Turner, Traci R, MD;  Location: [facility] ENDOSCOPY;  Service: Cardiovascular;  Laterality: N/A;  . TUBAL LIGATION    . WISDOM TOOTH EXTRACTION      Vitals:   07/25/18 1016  BP: 136/78  Pulse: 99  SpO2: 95%    Subjective Assessment - 07/25/18 1020    Subjective  Reports that she is doing well. Reports that her VOR HEP is going well but she feels dizzy on the elevator.     Pertinent History  stroke, peripheral neuropathy, multiple myeloma, HTN,  hiatal hernia, TEE with cardioversion    Diagnostic tests  none recent    Patient Stated Goals  work on leg strength    Currently in Pain?  No/denies                       [facility] Adult PT Treatment/Exercise - 07/25/18 0001      Knee/Hip Exercises: Aerobic   Nustep  L4 x 6 min (B UE/LE) - tolerated well       Knee/Hip Exercises: Standing   Terminal Knee Extension  Strengthening;Right;Left;1 set;10 reps    Terminal Knee Extension Limitations  TKE at walker with ball 10x5"    Hip Extension  Stengthening;Right;Left;1 set;10 reps;Knee straight    Extension Limitations  yellow TB at ankles; counter support    Functional Squat  1 set;3 seconds;15 reps    Functional Squat Limitations  at counter top    Other Standing Knee Exercises  sidestepping along counter top 2x length of counter with yellow TB at ankles      Knee/Hip Exercises: Seated   Sit to Sand  3 sets;5 reps;with UE support   2nd set with gaze stabilization; 3rd set on foam       Vestibular Treatment/Exercise - 07/25/18 0001      Vestibular Treatment/Exercise   Vestibular Treatment Provided  Gaze    Habituation Exercises  Seated Vertical Head Turns;Standing Horizontal Head Turns      Seated Vertical Head Turns   Number of Reps   10    Symptom Description   VOR and VOR cancellation      Standing Horizontal Head Turns   Number of Reps   10    Symptom Description   VOR and VOR cancellation              PT Short Term Goals - 06/11/18 0917      PT SHORT TERM GOAL #1   Title  Patient to be independent with initial HEP.    Time  1    Period  Weeks    Status  Achieved    Target Date  05/28/18        PT Long Term Goals - 06/11/18 0917      PT LONG TERM GOAL #1   Title  Patient to be independent with advanced HEP.    Time  6    Period  Weeks    Status  Partially Met   met for current   Target Date  08/06/18      PT LONG TERM GOAL #2   Title  Patient to score B LE strength atleast 3+/5.      Time  6    Period  Weeks    Status  Partially Met   improvements demonstrated in all muscle groups; most limited in B hip adduction, ankle DF, and PF strength   Target Date  08/06/18      PT LONG TERM GOAL #3   Title  Patient to improve TUG score by 10 sec in order to make progress towards decreased risk of falls.    Baseline  05/21/18 TUG 25.7 sec    Time  3    Period  Weeks    Status  Achieved   06/11/18 15.5 sec      PT LONG TERM GOAL #4   Title  Patient to improve 5xSTS score by 10 sec in order to make progress towards decreased risk of falls.    Baseline  05/21/18 5xSTS 28.9 sec with B UE support    Time  6    Period  Weeks    Status  Partially Met   06/11/18 23.6 sec   Target Date  08/06/18      PT LONG TERM GOAL #5   Title  Patient to report tolerance of standing/walking for 15 min before pain or fatigue limit her.     Time  6    Period  Weeks    Status  Partially Met   5 min at this time   Target Date  08/06/18      Additional Long Term Goals   Additional Long Term Goals  Yes      PT LONG TERM GOAL #6   Title  Patient to demonstrate 0 deg of B ankle AROM to decrease risk of falls.     Time  3    Period  Weeks    Status  Achieved      PT LONG TERM GOAL #7   Title  Patient to score <14 sec on TUG with LRAD to decrease risk of falls.     Time  6    Period  Weeks    Status  New      Target Date  08/06/18      PT LONG TERM GOAL #8   Title  Patient to demonstrate 10 deg of B ankle AROM to decrease risk of falls.     Time  6    Period  Weeks    Status  New    Target Date  08/06/18      PT LONG TERM GOAL  #9   TITLE  Patient to report <2/10 dizziness and mild sway with standing horizontal and vertical head turns while gaze fixed on target.     Time  6    Period  Weeks    Status  New    Target Date  08/06/18            Plan - 07/25/18 1056    Clinical Impression Statement  Patient arrived to session with no new complaints. Reports compliance with  vestibular HEP, but still reporting dizziness when she is on the elevator. Reviewed VOR HEP to correct patient's form and adjust speed for proper challenge. Worked on STS with gaze stabilization and compliant surface, challenging standing balance for several seconds once standing. Patient still quite limited in static standing balance without UE support. However, showing good improvement and tolerance in standing ther-ex. Ended session with no complaints. Patient progressing well per POC.     Comorbidities  stroke, peripheral neuropathy, multiple myeloma, HTN, hiatal hernia, TEE with cardioversion    PT Treatment/Interventions  ADLs/Self Care Home Management;Cryotherapy;Electrical Stimulation;Functional mobility training;Stair training;Gait training;DME Instruction;Moist Heat;Therapeutic activities;Therapeutic exercise;Balance training;Neuromuscular re-education;Patient/family education;Passive range of motion;Manual techniques;Dry needling;Energy conservation;Splinting;Taping;Orthotic Fit/Training;Vasopneumatic Device;Vestibular    PT Next Visit Plan  progress LE strengthening, work on gaze stabilization     Consulted and Agree with Plan of Care  Patient       Patient will benefit from skilled therapeutic intervention in order to improve the following deficits and impairments:  Abnormal gait, Decreased endurance, Decreased activity tolerance, Decreased strength, Pain, Decreased balance, Decreased mobility, Difficulty walking, Increased muscle spasms, Improper body mechanics, Decreased range of motion, Decreased safety awareness, Impaired flexibility, Postural dysfunction, Dizziness  Visit Diagnosis: Paralytic gait  Difficulty in walking, not elsewhere classified  Muscle weakness (generalized)  Unsteadiness on feet  Dizziness and giddiness  Other symptoms and signs involving the musculoskeletal system     Problem List Patient Active Problem List   Diagnosis Date Noted  . B12 deficiency  12/19/2016  . Chest pain 12/18/2016  . Cerebellar infarct ([place])   . Diastolic dysfunction   . Marijuana abuse   . History of tobacco abuse   . Benign essential HTN   . Tachycardia   . Hypokalemia   . Stroke (cerebrum) ([place]) 12/05/2016  . Nonintractable headache 12/05/2016  . Occipital stroke ([place]) 12/05/2016  . Hypertensive urgency 12/05/2016  . Hypertension     Janene Harvey, PT, DPT 07/25/18 11:00 AM    [facility] [address]  [place], [place], [address] Phone: [phone]   Fax:  [phone]  Name: Brittany [name] MRN: [id number] Date of Birth: 08/06/1965

## 2018-07-30 ENCOUNTER — Other Ambulatory Visit: Payer: Self-pay

## 2018-07-30 ENCOUNTER — Ambulatory Visit: Payer: Medicaid Other

## 2018-07-30 DIAGNOSIS — R262 Difficulty in walking, not elsewhere classified: Secondary | ICD-10-CM

## 2018-07-30 DIAGNOSIS — R2681 Unsteadiness on feet: Secondary | ICD-10-CM

## 2018-07-30 DIAGNOSIS — R42 Dizziness and giddiness: Secondary | ICD-10-CM | POA: Diagnosis not present

## 2018-07-30 DIAGNOSIS — M6281 Muscle weakness (generalized): Secondary | ICD-10-CM | POA: Diagnosis not present

## 2018-07-30 DIAGNOSIS — R29898 Other symptoms and signs involving the musculoskeletal system: Secondary | ICD-10-CM | POA: Diagnosis not present

## 2018-07-30 DIAGNOSIS — R261 Paralytic gait: Secondary | ICD-10-CM

## 2018-07-30 NOTE — Therapy (Signed)
[facility] [address]  [place], [place], [address] Phone: [phone]   Fax:  [phone]  Physical Therapy Treatment  Patient Details  Name: Brittany [name] MRN: [id number] Date of Birth: 03-16-1966 Referring Provider (PT): Kathe Becton, FNP   Encounter Date: 07/30/2018  PT End of Session - 07/30/18 1026    Visit Number  9    Number of Visits  16    Date for PT Re-Evaluation  08/06/18   starting after clinic reopening   Authorization Type  Medicaid    Authorization Time Period  06/25/18 - 08/05/18    Authorization - Visit Number  5    Authorization - Number of Visits  12    PT Start Time  1019    PT Stop Time  1100    PT Time Calculation (min)  41 min    Equipment Utilized During Treatment  --    Activity Tolerance  Patient tolerated treatment well;Patient limited by fatigue    Behavior During Therapy  [facility] for tasks assessed/performed       Past Medical History:  Diagnosis Date  . Anemia   . Elevated cholesterol   . Female bladder prolapse   . Hiatal hernia   . Hypertension   . IBS (irritable bowel syndrome)   . Multiple myeloma ([place])   . Peripheral neuropathy   . Stroke [facility])     Past Surgical History:  Procedure Laterality Date  . BONE BIOPSY    . COLONOSCOPY     over 10 years x3  . ESOPHAGOGASTRODUODENOSCOPY     incomplete-over 10 years ago   . TEE WITHOUT CARDIOVERSION N/A 12/08/2016   Procedure: TRANSESOPHAGEAL ECHOCARDIOGRAM (TEE);  Surgeon: Sueanne Margarita, MD;  Location: [facility] ENDOSCOPY;  Service: Cardiovascular;  Laterality: N/A;  . TUBAL LIGATION    . WISDOM TOOTH EXTRACTION      There were no vitals filed for this visit.  Subjective Assessment - 07/30/18 1024    Subjective  Pt. doing well today.  Reports dizziness when coming and leaving session after navigating in elevator.      Pertinent History  stroke, peripheral neuropathy, multiple myeloma, HTN, hiatal hernia, TEE with  cardioversion    Diagnostic tests  none recent    Patient Stated Goals  work on leg strength    Currently in Pain?  No/denies    Pain Score  0-No pain    Multiple Pain Sites  No                       [facility] Adult PT Treatment/Exercise - 07/30/18 0001      Knee/Hip Exercises: Aerobic   Nustep  L4 x 7 min (B UE/LE) - tolerated well       Knee/Hip Exercises: Standing   Heel Raises  Both;20 reps    Heel Raises Limitations  Heel raise/toe raise in RW    Forward Step Up  Right;Left;10 reps;Step Height: 6";Hand Hold: 2   well tolerated    Forward Step Up Limitations  RW UE support and HH support on L UE    Functional Squat  15 reps;1 set;3 seconds    Functional Squat Limitations  in RW    Wall Squat  3 seconds   x 12 reps   Wall Squat Limitations  therapist guarding knees and occasional 1 UE support on RW    Other Standing Knee Exercises  sidestepping at TM rail with yellow  TB at ankles  x 5 down/backs       Knee/Hip Exercises: Supine   Bridges  15 reps;Strengthening;Both    Bridges Limitations  Cues required to full hip extension                PT Short Term Goals - 06/11/18 0917      PT SHORT TERM GOAL #1   Title  Patient to be independent with initial HEP.    Time  1    Period  Weeks    Status  Achieved    Target Date  05/28/18        PT Long Term Goals - 06/11/18 0917      PT LONG TERM GOAL #1   Title  Patient to be independent with advanced HEP.    Time  6    Period  Weeks    Status  Partially Met   met for current   Target Date  08/06/18      PT LONG TERM GOAL #2   Title  Patient to score B LE strength atleast 3+/5.     Time  6    Period  Weeks    Status  Partially Met   improvements demonstrated in all muscle groups; most limited in B hip adduction, ankle DF, and PF strength   Target Date  08/06/18      PT LONG TERM GOAL #3   Title  Patient to improve TUG score by 10 sec in order to make progress towards decreased risk of falls.     Baseline  05/21/18 TUG 25.7 sec    Time  3    Period  Weeks    Status  Achieved   06/11/18 15.5 sec      PT LONG TERM GOAL #4   Title  Patient to improve 5xSTS score by 10 sec in order to make progress towards decreased risk of falls.    Baseline  05/21/18 5xSTS 28.9 sec with B UE support    Time  6    Period  Weeks    Status  Partially Met   06/11/18 23.6 sec   Target Date  08/06/18      PT LONG TERM GOAL #5   Title  Patient to report tolerance of standing/walking for 15 min before pain or fatigue limit her.     Time  6    Period  Weeks    Status  Partially Met   5 min at this time   Target Date  08/06/18      Additional Long Term Goals   Additional Long Term Goals  Yes      PT LONG TERM GOAL #6   Title  Patient to demonstrate 0 deg of B ankle AROM to decrease risk of falls.     Time  3    Period  Weeks    Status  Achieved      PT LONG TERM GOAL #7   Title  Patient to score <14 sec on TUG with LRAD to decrease risk of falls.     Time  6    Period  Weeks    Status  New    Target Date  08/06/18      PT LONG TERM GOAL #8   Title  Patient to demonstrate 10 deg of B ankle AROM to decrease risk of falls.     Time  6    Period  Weeks    Status  New    Target Date  08/06/18      PT LONG TERM GOAL  #9   TITLE  Patient to report <2/10 dizziness and mild sway with standing horizontal and vertical head turns while gaze fixed on target.     Time  6    Period  Weeks    Status  New    Target Date  08/06/18            Plan - 07/30/18 1031    Clinical Impression Statement  Brittany [name] reporting she felt fine after last session.  Reports she has been performing VOR HEP "correctly" after having supervising PT adjusting pt. technique last session.  Tolerated progression of LE strengthening activities with addition of wall sit and 6"step-up well today.  Ended session with pt. pain free and verbalizing LE fatigue.  Progressing well toward goals.      Personal Factors and  Comorbidities  Comorbidity 3+;Social Background;Time since onset of injury/illness/exacerbation;Finances    Comorbidities  stroke, peripheral neuropathy, multiple myeloma, HTN, hiatal hernia, TEE with cardioversion    Examination-Activity Limitations  Bathing;Bend;Carry;Squat;Stairs;Stand;Lift;Transfers;Locomotion Level    Examination-Participation Restrictions  Cleaning;Community Activity;Shop;Driving;Yard Work;Interpersonal Relationship;Laundry;Meal Prep    PT Treatment/Interventions  ADLs/Self Care Home Management;Cryotherapy;Electrical Stimulation;Functional mobility training;Stair training;Gait training;DME Instruction;Moist Heat;Therapeutic activities;Therapeutic exercise;Balance training;Neuromuscular re-education;Patient/family education;Passive range of motion;Manual techniques;Dry needling;Energy conservation;Splinting;Taping;Orthotic Fit/Training;Vasopneumatic Device;Vestibular    PT Next Visit Plan  progress LE strengthening, work on gaze stabilization     Consulted and Agree with Plan of Care  Patient       Patient will benefit from skilled therapeutic intervention in order to improve the following deficits and impairments:  Abnormal gait, Decreased endurance, Decreased activity tolerance, Decreased strength, Pain, Decreased balance, Decreased mobility, Difficulty walking, Increased muscle spasms, Improper body mechanics, Decreased range of motion, Decreased safety awareness, Impaired flexibility, Postural dysfunction, Dizziness  Visit Diagnosis: Paralytic gait  Difficulty in walking, not elsewhere classified  Muscle weakness (generalized)  Unsteadiness on feet  Dizziness and giddiness  Other symptoms and signs involving the musculoskeletal system     Problem List Patient Active Problem List   Diagnosis Date Noted  . B12 deficiency 12/19/2016  . Chest pain 12/18/2016  . Cerebellar infarct ([place])   . Diastolic dysfunction   . Marijuana abuse   . History of tobacco abuse    . Benign essential HTN   . Tachycardia   . Hypokalemia   . Stroke (cerebrum) ([place]) 12/05/2016  . Nonintractable headache 12/05/2016  . Occipital stroke ([place]) 12/05/2016  . Hypertensive urgency 12/05/2016  . Hypertension     Bess Harvest, [place] 07/30/18 12:53 PM   [facility] [address]  [place], [place], [address] Phone: [phone]   Fax:  [phone]  Name: Brittany [name] MRN: [id number] Date of Birth: 23-Jul-1965

## 2018-07-31 ENCOUNTER — Telehealth: Payer: Self-pay

## 2018-07-31 NOTE — Telephone Encounter (Signed)
Spoke with the patient and she has given verbal consent to do a [email or web address] visit and to file her insurance. E-mail has been sent and confirmed.  E-mail: [email or web address]

## 2018-08-01 ENCOUNTER — Ambulatory Visit: Payer: Medicaid Other | Admitting: Physical Therapy

## 2018-08-01 ENCOUNTER — Other Ambulatory Visit: Payer: Self-pay

## 2018-08-01 ENCOUNTER — Encounter: Payer: Self-pay | Admitting: Physical Therapy

## 2018-08-01 DIAGNOSIS — R42 Dizziness and giddiness: Secondary | ICD-10-CM | POA: Diagnosis not present

## 2018-08-01 DIAGNOSIS — R262 Difficulty in walking, not elsewhere classified: Secondary | ICD-10-CM | POA: Diagnosis not present

## 2018-08-01 DIAGNOSIS — M6281 Muscle weakness (generalized): Secondary | ICD-10-CM

## 2018-08-01 DIAGNOSIS — R261 Paralytic gait: Secondary | ICD-10-CM

## 2018-08-01 DIAGNOSIS — R29898 Other symptoms and signs involving the musculoskeletal system: Secondary | ICD-10-CM | POA: Diagnosis not present

## 2018-08-01 DIAGNOSIS — R2681 Unsteadiness on feet: Secondary | ICD-10-CM | POA: Diagnosis not present

## 2018-08-01 NOTE — Therapy (Signed)
[facility] [address]  [place], [place], [address] Phone: [phone]   Fax:  [phone]  Physical Therapy Treatment  Patient Details  Name: Brittany [name] MRN: [id number] Date of Birth: [birth year] Referring Provider (PT): Kathe Becton, FNP   Encounter Date: 08/01/2018  PT End of Session - 08/01/18 1114    Visit Number  10    Number of Visits  16    Date for PT Re-Evaluation  09/12/18    Authorization Type  Medicaid    Authorization Time Period  06/25/18 - 08/05/18    Authorization - Visit Number  6    Authorization - Number of Visits  12    PT Start Time  1013    PT Stop Time  1105    PT Time Calculation (min)  52 min    Equipment Utilized During Treatment  Gait belt    Activity Tolerance  Patient tolerated treatment well    Behavior During Therapy  WFL for tasks assessed/performed       Past Medical History:  Diagnosis Date  . Anemia   . Elevated cholesterol   . Female bladder prolapse   . Hiatal hernia   . Hypertension   . IBS (irritable bowel syndrome)   . Multiple myeloma ([place])   . Peripheral neuropathy   . Stroke [facility])     Past Surgical History:  Procedure Laterality Date  . BONE BIOPSY    . COLONOSCOPY     over 10 years x3  . ESOPHAGOGASTRODUODENOSCOPY     incomplete-over 10 years ago   . TEE WITHOUT CARDIOVERSION N/A 12/08/2016   Procedure: TRANSESOPHAGEAL ECHOCARDIOGRAM (TEE);  Surgeon: Sueanne Margarita, MD;  Location: [facility] ENDOSCOPY;  Service: Cardiovascular;  Laterality: N/A;  . TUBAL LIGATION    . WISDOM TOOTH EXTRACTION      There were no vitals filed for this visit.  Subjective Assessment - 08/01/18 1013    Subjective  Reports she is doing well. Asking about walker with handles reversed. Reports that she feels stronger since starting PT- notes that she is better able to tolerate walking in her yard. Reports 60% improvement- would like to contnue working on dizziness and balance.      Pertinent History  stroke, peripheral neuropathy, multiple myeloma, HTN, hiatal hernia, TEE with cardioversion    Diagnostic tests  none recent    Patient Stated Goals  work on leg strength    Currently in Pain?  No/denies         [facility] PT Assessment - 08/01/18 0001      Assessment   Medical Diagnosis  CVA, B LE pain    Referring Provider (PT)  Kathe Becton, FNP    Onset Date/Surgical Date  12/18/16      AROM   Right Ankle Dorsiflexion  12    Left Ankle Dorsiflexion  10      Strength   Right Hip Flexion  4/5    Right Hip ABduction  4/5    Right Hip ADduction  4/5    Left Hip Flexion  4/5    Left Hip ABduction  4/5    Left Hip ADduction  4/5    Right Knee Flexion  4-/5    Right Knee Extension  4/5    Left Knee Flexion  4-/5    Left Knee Extension  4/5    Right Ankle Dorsiflexion  3+/5    Right Ankle Plantar Flexion  3+/5  Left Ankle Dorsiflexion  3+/5    Left Ankle Plantar Flexion  3+/5      Standardized Balance Assessment   Standardized Balance Assessment  Five Times Sit to Stand;Timed Up and Go Test    Five times sit to stand comments   25.3 w/ B UEs on armrests   c/o L arm pain from pushing up from armrests     Timed Up and Go Test   Normal TUG (seconds)  18.9   taking extra time to lock/unlock walker brakes                  [facility] Adult PT Treatment/Exercise - 08/01/18 0001      Knee/Hip Exercises: Aerobic   Nustep  L4 x 6 min (B UE/LE) - tolerated well       Knee/Hip Exercises: Standing   Forward Step Up  Right;Left;1 set;Hand Hold: 2;Step Height: 6"    Forward Step Up Limitations  sink support   cues for upright posture after each rep   Other Standing Knee Exercises  anterior step up on foam at sink 5x each LE    improved posture     Vestibular Treatment/Exercise - 08/01/18 0001      Vestibular Treatment/Exercise   Habituation Exercises  Standing Horizontal Head Turns;Standing Vertical Head Turns      Standing Horizontal Head Turns    Number of Reps   10    Symptom Description   VOR   cues to maintain gaze stabiilization on target     Standing Vertical Head Turns   Number of Reps   10    Symptom Description   VOR            PT Education - 08/01/18 1111    Education Details  update and consolidation of HEP; discussion on objective progress with PT and remaining impairments    Person(s) Educated  Patient    Methods  Explanation;Demonstration;Tactile cues;Verbal cues;Handout    Comprehension  Verbalized understanding;Returned demonstration       PT Short Term Goals - 08/01/18 1028      PT SHORT TERM GOAL #1   Title  Patient to be independent with initial HEP.    Time  1    Period  Weeks    Status  Achieved    Target Date  05/28/18        PT Long Term Goals - 08/01/18 1029      PT LONG TERM GOAL #1   Title  Patient to be independent with advanced HEP.    Time  6    Period  Weeks    Status  Partially Met   met for current   Target Date  09/12/18      PT LONG TERM GOAL #2   Title  Patient to score B LE strength atleast 3+/5.     Time  6    Period  Weeks    Status  Achieved      PT LONG TERM GOAL #3   Title  Patient to improve TUG score by 10 sec in order to make progress towards decreased risk of falls.    Baseline  05/21/18 TUG 25.7 sec    Time  3    Period  Weeks    Status  Achieved   06/11/18 15.5 sec      PT LONG TERM GOAL #4   Title  Patient to improve 5xSTS score by 10 sec in order to make progress towards  decreased risk of falls.    Baseline  05/21/18 5xSTS 28.9 sec with B UE support    Time  6    Period  Weeks    Status  Partially Met   08/01/18: 25.3 sec   Target Date  09/12/18      PT LONG TERM GOAL #5   Title  Patient to report tolerance of standing/walking for 15 min before pain or fatigue limit her.     Time  6    Period  Weeks    Status  Partially Met   10 min at this time   Target Date  09/12/18      PT LONG TERM GOAL #6   Title  Patient to demonstrate 0 deg  of B ankle AROM to decrease risk of falls.     Time  3    Period  Weeks    Status  Achieved      PT LONG TERM GOAL #7   Title  Patient to score <14 sec on TUG with LRAD to decrease risk of falls.     Time  6    Period  Weeks    Status  On-going   08/01/18: 18.9 sec with 4WW   Target Date  09/12/18      PT LONG TERM GOAL #8   Title  Patient to demonstrate 10 deg of B ankle AROM to decrease risk of falls.     Time  6    Period  Weeks    Status  Achieved      PT LONG TERM GOAL  #9   TITLE  Patient to report <2/10 dizziness and mild sway with standing horizontal and vertical head turns while gaze fixed on target.     Time  6    Period  Weeks    Status  On-going   c/o trouble focusing with vertical & horizontal head turns with walker support and unsteadiness   Target Date  09/12/18            Plan - 08/01/18 1125    Clinical Impression Statement  Patient arrived to session with report of 60% improvement since starting PT. Would like to continue working on her balance and dizziness, as riding the elevator and getting into the shower is difficult for her. Notes that she feels that her legs are stronger and is now better able to tolerate walking in her backyard. Patient has met strength goal at this time; B dorsiflexion/plantarflexion and knee flexion strength most limiting. Now reporting tolerance of 10 min on her feet before pain or fatigue limit her. Has also met B ankle AROM dorsiflexion goal at this time. Patient with c/o trouble focusing with vertical & horizontal VOR with walker support, as well as feeling of unsteadiness. Patient scored slightly slower on TUG and 5xSTS testing this session, however believe this is due to improvement in safety awareness/use of walker brakes as well as more upright posture with STS. Worked on progressive LE strengthening and balance training without walker support. Patient reporting more difficulty with step ups on step compared to foam surface d/t  LE fatigue. Updated and consolidated HEP to target remaining limitations- patient reported understanding. Would benefit from continued skilled PT services 1x/week for 6 weeks to address remaining goals.     Comorbidities  stroke, peripheral neuropathy, multiple myeloma, HTN, hiatal hernia, TEE with cardioversion    PT Frequency  1x / week    PT Duration  6 weeks  PT Treatment/Interventions  ADLs/Self Care Home Management;Cryotherapy;Electrical Stimulation;Functional mobility training;Stair training;Gait training;DME Instruction;Moist Heat;Therapeutic activities;Therapeutic exercise;Balance training;Neuromuscular re-education;Patient/family education;Passive range of motion;Manual techniques;Dry needling;Energy conservation;Splinting;Taping;Orthotic Fit/Training;Vasopneumatic Device;Vestibular    PT Next Visit Plan  continue challenging standing balance, work on gaze stabilization     Consulted and Agree with Plan of Care  Patient       Patient will benefit from skilled therapeutic intervention in order to improve the following deficits and impairments:  Abnormal gait, Decreased endurance, Decreased activity tolerance, Decreased strength, Pain, Decreased balance, Decreased mobility, Difficulty walking, Increased muscle spasms, Improper body mechanics, Decreased range of motion, Decreased safety awareness, Impaired flexibility, Postural dysfunction, Dizziness  Visit Diagnosis: Paralytic gait  Difficulty in walking, not elsewhere classified  Muscle weakness (generalized)  Unsteadiness on feet  Dizziness and giddiness  Other symptoms and signs involving the musculoskeletal system     Problem List Patient Active Problem List   Diagnosis Date Noted  . B12 deficiency 12/19/2016  . Chest pain 12/18/2016  . Cerebellar infarct ([place])   . Diastolic dysfunction   . Marijuana abuse   . History of tobacco abuse   . Benign essential HTN   . Tachycardia   . Hypokalemia   . Stroke (cerebrum)  ([place]) 12/05/2016  . Nonintractable headache 12/05/2016  . Occipital stroke ([place]) 12/05/2016  . Hypertensive urgency 12/05/2016  . Hypertension     Janene Harvey, PT, DPT 08/01/18 11:28 AM    [facility] [address]  [place], [place], [address] Phone: [phone]   Fax:  [phone]  Name: Evanee Lubrano MRN: [id number] Date of Birth: [birth year]

## 2018-08-02 ENCOUNTER — Ambulatory Visit: Payer: Medicaid Other | Admitting: Physical Medicine & Rehabilitation

## 2018-08-05 ENCOUNTER — Encounter: Payer: Self-pay | Admitting: Adult Health

## 2018-08-05 ENCOUNTER — Ambulatory Visit (INDEPENDENT_AMBULATORY_CARE_PROVIDER_SITE_OTHER): Payer: Medicaid Other | Admitting: Adult Health

## 2018-08-05 ENCOUNTER — Other Ambulatory Visit: Payer: Self-pay

## 2018-08-05 ENCOUNTER — Telehealth: Payer: Self-pay | Admitting: Adult Health

## 2018-08-05 DIAGNOSIS — Z8673 Personal history of transient ischemic attack (TIA), and cerebral infarction without residual deficits: Secondary | ICD-10-CM

## 2018-08-05 DIAGNOSIS — R609 Edema, unspecified: Secondary | ICD-10-CM

## 2018-08-05 DIAGNOSIS — M792 Neuralgia and neuritis, unspecified: Secondary | ICD-10-CM | POA: Diagnosis not present

## 2018-08-05 DIAGNOSIS — M5412 Radiculopathy, cervical region: Secondary | ICD-10-CM

## 2018-08-05 DIAGNOSIS — G5603 Carpal tunnel syndrome, bilateral upper limbs: Secondary | ICD-10-CM

## 2018-08-05 MED ORDER — PREGABALIN 75 MG PO CAPS: 75.0000 mg | ORAL_CAPSULE | Freq: Three times a day (TID) | ORAL | 0 refills | Status: DC

## 2018-08-05 NOTE — Progress Notes (Signed)
[facility] [address] [place]. [place] [address] [phone]       FOLLOW UP NOTE  [name] Date of Birth:  1965-07-17 Medical Record Number:  [id number]   Referring MD:  Kathe Becton, NP Reason for Referral/visit:  neuropathy  Virtual Visit via Video Note  I connected with [name] on 08/05/18 at 12:45 PM EDT by a video enabled telemedicine application located remotely in my own home and verified that I am speaking with the correct person using two identifiers who was located at their own home.   I discussed the limitations of evaluation and management by telemedicine and the availability of in person appointments. The patient expressed understanding and agreed to proceed.   HPI:   Ms Perazzo is a 53 year pleasant African-American lady who was initially scheduled today for follow-up visit regarding neuropathy but due to COVID-19 safety precautions, visit transition to telemedicine via [email or web address] with patient's consent.    She endorses significant paresthesias and discomfort in her feet, hands bilaterally and left arm > right arm which has been present for greater than 10 years.  She did undergo EMG which did not show evidence of peripheral neuropathy but did show mild bilateral carpal tunnel syndrome. Patient was admitted to [facility] in September 2018 with dizziness and was found to have left posterior inferior cerebellar artery infarct which was evidenced on MRI.  Initial visit on 01/23/2018 with Dr. Leonie Man and recommended initiating Topamax and continue gabapentin which she was previously on.  Neuropathy panel and EMG unremarkable except for mild bilateral carpal tunnel syndrome.  She unfortunately was unable to tolerate Topamax due to memory side effects and therefore recommended initiating Lyrica at prior office visit on 04/30/2051 with discontinuing gabapentin.    At today's visit, she does endorse mild benefit with use of Lyrica  but does continue to experience paresthesias.  She does endorse occasional subjective weakness of left hand where she will frequently drop items.  Left arm pain has been worsening where she feels a burning and numbness sensation from her shoulder down to her fingertips with left-sided neck, trapezius and shoulder pain.  This pain has been present "for as long as I can remember" but has recently only been worsening.    She does endorse previously trying physical therapy for this neck pain but did not gain much benefit.  She unfortunately has not used wrist splints as recommended for carpal tunnel syndrome as she is currently on disability and unable to afford them.  She declined interest in release procedure at this time.She also endorses left>right bilateral lower extremity swelling and occasional left wrist/hand swelling.  She continues to work with her PCP in regards to the swelling and is currently participating in therapy but has not gained much benefit.    ROS:   14 system review of systems is positive for numbness, weakness, swelling and all other systems negative  PMH:  Past Medical History:  Diagnosis Date  . Anemia   . Elevated cholesterol   . Female bladder prolapse   . Hiatal hernia   . Hypertension   . IBS (irritable bowel syndrome)   . Multiple myeloma ([place])   . Peripheral neuropathy   . Stroke [facility])     Social History:  Social History   Socioeconomic History  . Marital status: Legally Separated    Spouse name: Not on file  . Number of children: Not on file  . Years of education: Not on file  .  Highest education level: Not on file  Occupational History  . Occupation: Disabled  Social Needs  . Financial resource strain: Not on file  . Food insecurity:    Worry: Not on file    Inability: Not on file  . Transportation needs:    Medical: Not on file    Non-medical: Not on file  Tobacco Use  . Smoking status: Former Smoker    Last attempt to quit: 12/06/2015    Years  since quitting: 2.6  . Smokeless tobacco: Never Used  Substance and Sexual Activity  . Alcohol use: Not Currently  . Drug use: Not Currently    Types: Marijuana    Comment: in the past   . Sexual activity: Not on file  Lifestyle  . Physical activity:    Days per week: Not on file    Minutes per session: Not on file  . Stress: Not on file  Relationships  . Social connections:    Talks on phone: Not on file    Gets together: Not on file    Attends religious service: Not on file    Active member of club or organization: Not on file    Attends meetings of clubs or organizations: Not on file    Relationship status: Not on file  . Intimate partner violence:    Fear of current or ex partner: Not on file    Emotionally abused: Not on file    Physically abused: Not on file    Forced sexual activity: Not on file  Other Topics Concern  . Not on file  Social History Narrative  . Not on file    Medications:   Current Outpatient Medications on File Prior to Visit  Medication Sig Dispense Refill  . albuterol (PROVENTIL HFA;VENTOLIN HFA) 108 (90 Base) MCG/ACT inhaler Inhale 2 puffs into the lungs every 4 (four) hours as needed for wheezing or shortness of breath (cough, shortness of breath or wheezing.). 1 Inhaler 5  . amLODipine (NORVASC) 10 MG tablet Take 1 tablet (10 mg total) by mouth daily. 30 tablet 2  . aspirin 325 MG tablet Take 1 tablet (325 mg total) by mouth daily. 30 tablet 0  . atorvastatin (LIPITOR) 40 MG tablet Take 1 tablet (40 mg total) by mouth daily. 30 tablet 3  . cyanocobalamin 1000 MCG tablet Take 1 tablet (1,000 mcg total) by mouth daily. 30 tablet 4  . dimenhyDRINATE (DRAMAMINE) 50 MG tablet Take 50 mg by mouth every 8 (eight) hours as needed for dizziness.    . docusate sodium (COLACE) 100 MG capsule Take 200 mg by mouth 2 (two) times daily.     . Ergocalciferol (VITAMIN D2) 10 MCG (400 UNIT) TABS Take 10 mcg by mouth daily. 30 tablet 6  . furosemide (LASIX) 20 MG  tablet Take 1 tablet (20 mg total) by mouth 2 (two) times daily as needed. 60 tablet 2  . gabapentin (NEURONTIN) 300 MG capsule Take 2 capsules (600 mg total) by mouth 3 (three) times daily. 120 capsule 1  . hydrOXYzine (VISTARIL) 50 MG capsule Take 1-2 capsules (50-100 mg total) by mouth 3 (three) times daily as needed for itching. 30 capsule 1  . methocarbamol (ROBAXIN) 500 MG tablet Take 1 tablet (500 mg total) by mouth 3 (three) times daily. 90 tablet 2  . Multiple Vitamin (MULTIVITAMIN) capsule Take 4 capsules by mouth 2 (two) times daily. Centrum plus    . NIFEdipine (ADALAT CC) 30 MG 24 hr tablet Take 2  tablets (60 mg total) by mouth daily. 60 tablet 6  . omeprazole (PRILOSEC) 20 MG capsule Take 1 capsule (20 mg total) by mouth daily. 30 capsule 2  . omeprazole (PRILOSEC) 20 MG capsule Take 1 capsule (20 mg total) by mouth 2 (two) times daily before a meal. 60 capsule 3  . polyethylene glycol powder (GLYCOLAX/MIRALAX) powder Take 17 g 2 (two) times daily as needed by mouth. 3350 g 1  . Potassium Chloride ER 20 MEQ TBCR Take 20 mEq by mouth daily. 30 tablet 3  . pregabalin (LYRICA) 50 MG capsule Take 1 capsule (50 mg total) by mouth 3 (three) times daily. 90 capsule 2  . senna (SENOKOT) 8.6 MG TABS tablet Take 2 tablets by mouth 2 (two) times daily.    [place] Gunning BOWEL PREP KIT 17.5-3.13-1.6 GM/177ML SOLN Take 1 kit by mouth as directed. 354 mL 0  . triamcinolone cream (KENALOG) 0.1 % Apply 1 application topically 2 (two) times daily. 454 g 0   No current facility-administered medications on file prior to visit.     Allergies:   Allergies  Allergen Reactions  . Darvon [Propoxyphene] Nausea And Vomiting  . Hydrocodone Nausea And Vomiting  . Lactose Intolerance (Gi)   . Naproxen   . Other     States can't take pain meds that end in "cet" or meds that end in "ine" Darvocet  . Oxycodone Nausea And Vomiting  . Percocet [Oxycodone-Acetaminophen] Nausea And Vomiting  . Topamax [Topiramate]      Memory made her emotional   . Latex Itching and Rash    Physical Exam General: Obese pleasant middle-aged African-American lady seated, in no evident distress Head: head normocephalic and atraumatic.     Neurologic Exam Mental Status: Awake and fully alert. Oriented to place and time. Recent and remote memory intact. Attention span, concentration and fund of knowledge appropriate. Mood and affect appropriate.  Cranial Nerves: Extraocular movements full without nystagmus. Hearing intact to voice. Facial sensation intact. Face, tongue, palate moves normally and symmetrically.  Shoulder shrug symmetric. Motor: Unable to appreciate weakness per drift assessment Sensory.:  UTA Coordination: Rapid alternating movements normal in all extremities. Finger-to-nose and heel-to-shin performed accurately bilaterally. Gait and Station: Arises from chair with mild difficulty. Stance is broad-based. Gait demonstrates wide-based with mild imbalance  .  Uses a wheeled walker.  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  Vascular risk factors of hypertension, hyperlipidemia and patent foramen ovale.  Mild bilateral carpal tunnel syndrome.  Mild benefit with use of Lyrica with ongoing bilateral foot and hand numbness and does endorse left arm radiculopathy with paresthesias with neck pain.     PLAN: Increase Lyrica dosage to 75 mg 3 times daily due to ongoing neuropathic pain  ?  Cervical radiculopathy -order placed for MRI cervical w/o contrast -neck pain present >10 years with worsening radiculopathy pain over the past couple years consisting of burning and radiating pain with numbness located left side of neck down to fingertips.  Conservative treatments attempted without  benefit.  Advised her that if imaging unremarkable, consider physical therapy to assist with cervicalgia Encouraged use of wrist splints due to evidence of bilateral carpal tunnel syndrome and avoidance of repetitive wrist flexing movements Continue to follow with PCP regarding lower extremity edema which could be  related to cardiovascular versus medication side effects such as amlodipine.  Discussion regarding use of compression stockings, limiting sodium intake, regular exercise and raising legs frequently  Follow-up in 1 month or call earlier if needed  Greater than 50% time during this 25-minute non-face-to-face visit was spent on counseling and coordination of care about her neuropathic pain, potential cervical radiculopathy and ongoing paresthesias.  All questions answered to patient satisfaction.   Venancio Poisson, AGNP-BC  [facility] [address] [place], [place] [address]  Phone [phone] Fax [phone] Note: This document was prepared with digital dictation and possible smart phrase technology. Any transcriptional errors that result from this process are unintentional.

## 2018-08-05 NOTE — Progress Notes (Signed)
I agree with the above plan 

## 2018-08-05 NOTE — Telephone Encounter (Signed)
medicaid order sent to [facility]. They will obtain the auth and reach out to the pt to schedule.  °

## 2018-08-06 ENCOUNTER — Other Ambulatory Visit: Payer: Self-pay | Admitting: Family Medicine

## 2018-08-06 DIAGNOSIS — E876 Hypokalemia: Secondary | ICD-10-CM

## 2018-08-08 ENCOUNTER — Encounter: Payer: Self-pay | Admitting: Physical Therapy

## 2018-08-08 ENCOUNTER — Other Ambulatory Visit: Payer: Self-pay

## 2018-08-08 ENCOUNTER — Ambulatory Visit: Payer: Medicaid Other | Admitting: Physical Therapy

## 2018-08-08 DIAGNOSIS — M6281 Muscle weakness (generalized): Secondary | ICD-10-CM | POA: Diagnosis not present

## 2018-08-08 DIAGNOSIS — R262 Difficulty in walking, not elsewhere classified: Secondary | ICD-10-CM | POA: Diagnosis not present

## 2018-08-08 DIAGNOSIS — R261 Paralytic gait: Secondary | ICD-10-CM | POA: Diagnosis not present

## 2018-08-08 DIAGNOSIS — R29898 Other symptoms and signs involving the musculoskeletal system: Secondary | ICD-10-CM

## 2018-08-08 DIAGNOSIS — R2681 Unsteadiness on feet: Secondary | ICD-10-CM

## 2018-08-08 DIAGNOSIS — R42 Dizziness and giddiness: Secondary | ICD-10-CM | POA: Diagnosis not present

## 2018-08-08 NOTE — Therapy (Signed)
[facility] [address]  [place], [place], [address] Phone: [phone]   Fax:  [phone]  Physical Therapy Treatment  Patient Details  Name: Brittany [name] MRN: [id number] Date of Birth: [birth year] Referring Provider (PT): Kathe Becton, FNP   Encounter Date: 08/08/2018  PT End of Session - 08/08/18 1050    Visit Number  11    Number of Visits  16    Date for PT Re-Evaluation  09/12/18    Authorization Type  Medicaid    Authorization Time Period  08/08/18 - 09/18/18    Authorization - Visit Number  1    Authorization - Number of Visits  6    PT Start Time  1245    PT Stop Time  1045    PT Time Calculation (min)  43 min    Equipment Utilized During Treatment  --    Activity Tolerance  Patient tolerated treatment well;Patient limited by fatigue    Behavior During Therapy  [facility] for tasks assessed/performed       Past Medical History:  Diagnosis Date  . Anemia   . Elevated cholesterol   . Female bladder prolapse   . Hiatal hernia   . Hypertension   . IBS (irritable bowel syndrome)   . Multiple myeloma ([place])   . Peripheral neuropathy   . Stroke [facility])     Past Surgical History:  Procedure Laterality Date  . BONE BIOPSY    . COLONOSCOPY     over 10 years x3  . ESOPHAGOGASTRODUODENOSCOPY     incomplete-over 10 years ago   . TEE WITHOUT CARDIOVERSION N/A 12/08/2016   Procedure: TRANSESOPHAGEAL ECHOCARDIOGRAM (TEE);  Surgeon: Sueanne Margarita, MD;  Location: [facility] ENDOSCOPY;  Service: Cardiovascular;  Laterality: N/A;  . TUBAL LIGATION    . WISDOM TOOTH EXTRACTION      There were no vitals filed for this visit.  Subjective Assessment - 08/08/18 1002    Subjective  Reports that she has been having more edema in her legs this week- was told it is a result of the stroke. Has been taking her fluid pill as directed by MD.     Pertinent History  stroke, peripheral neuropathy, multiple myeloma, HTN, hiatal  hernia, TEE with cardioversion    Diagnostic tests  none recent    Patient Stated Goals  work on leg strength    Pain Score  4    Pain Location  Arm   forearm   Pain Orientation  Left    Pain Descriptors / Indicators  Burning    Pain Type  Chronic pain                       [facility] Adult PT Treatment/Exercise - 08/08/18 0001      Knee/Hip Exercises: Stretches   Gastroc Stretch  Right;Left;1 rep;30 seconds    Gastroc Stretch Limitations  at treadmill rail      Knee/Hip Exercises: Aerobic   Nustep  L4 x 6 min (B UE/LE) - tolerated well       Knee/Hip Exercises: Standing   Hip Flexion  Stengthening;Both;Knee bent    Hip Flexion Limitations  marching in place on foam w/ 2/1 UE support on treadmill rail x2 min   c/o fatigue   SLS  R & L SLS on foam w/ B UE support on treadmill rail x30" each    Other Standing Knee Exercises  lateral steps over pincushion &  cone 10x each with 2/1 UE support on treadmill rail   CGA & cues for max hip flexion & DF     Knee/Hip Exercises: Seated   Long Arc Quad  Right;Left;10 reps    Long Arc Quad Limitations  red TB at ankle     Clamshell with TheraBand  Red   20x     Knee/Hip Exercises: Supine   Bridges with Ball Squeeze  Both;15 reps;1 set;Strengthening    Bridges with Clamshell  Strengthening;Both;1 set;15 reps   red TB            PT Education - 08/08/18 1050    Education Details  administered red TB for LAQ    Person(s) Educated  Patient    Methods  Explanation;Demonstration;Tactile cues;Verbal cues    Comprehension  Verbalized understanding;Returned demonstration       PT Short Term Goals - 08/01/18 1028      PT SHORT TERM GOAL #1   Title  Patient to be independent with initial HEP.    Time  1    Period  Weeks    Status  Achieved    Target Date  05/28/18        PT Long Term Goals - 08/01/18 1029      PT LONG TERM GOAL #1   Title  Patient to be independent with advanced HEP.    Time  6    Period  Weeks     Status  Partially Met   met for current   Target Date  09/12/18      PT LONG TERM GOAL #2   Title  Patient to score B LE strength atleast 3+/5.     Time  6    Period  Weeks    Status  Achieved      PT LONG TERM GOAL #3   Title  Patient to improve TUG score by 10 sec in order to make progress towards decreased risk of falls.    Baseline  05/21/18 TUG 25.7 sec    Time  3    Period  Weeks    Status  Achieved   06/11/18 15.5 sec      PT LONG TERM GOAL #4   Title  Patient to improve 5xSTS score by 10 sec in order to make progress towards decreased risk of falls.    Baseline  05/21/18 5xSTS 28.9 sec with B UE support    Time  6    Period  Weeks    Status  Partially Met   08/01/18: 25.3 sec   Target Date  09/12/18      PT LONG TERM GOAL #5   Title  Patient to report tolerance of standing/walking for 15 min before pain or fatigue limit her.     Time  6    Period  Weeks    Status  Partially Met   10 min at this time   Target Date  09/12/18      PT LONG TERM GOAL #6   Title  Patient to demonstrate 0 deg of B ankle AROM to decrease risk of falls.     Time  3    Period  Weeks    Status  Achieved      PT LONG TERM GOAL #7   Title  Patient to score <14 sec on TUG with LRAD to decrease risk of falls.     Time  6    Period  Weeks    Status  On-going   08/01/18: 18.9 sec with 4WW   Target Date  09/12/18      PT LONG TERM GOAL #8   Title  Patient to demonstrate 10 deg of B ankle AROM to decrease risk of falls.     Time  6    Period  Weeks    Status  Achieved      PT LONG TERM GOAL  #9   TITLE  Patient to report <2/10 dizziness and mild sway with standing horizontal and vertical head turns while gaze fixed on target.     Time  6    Period  Weeks    Status  On-going   c/o trouble focusing with vertical & horizontal head turns with walker support and unsteadiness   Target Date  09/12/18            Plan - 08/08/18 1051    Clinical Impression Statement  Patient  arrived to session with report of increased swelling in B LEs in the past week- patient reporting that she is taking medication for this as directed by MD. Worked on progressive hip strengthening ther-ex on mat with good effort and form. Patient with report of muscle fatigue after bridge with banded resistance. Challenged dynamic standing balance on firm and compliant surfaces with patient most limited by fatigue. Introduced SLS on foam with B UE support and cues to maintain TKE and prevent hip drop. Administered red TB for increased resistance with LAQ- patient reported understanding. Patient without complaints at end of session.     Comorbidities  stroke, peripheral neuropathy, multiple myeloma, HTN, hiatal hernia, TEE with cardioversion    PT Treatment/Interventions  ADLs/Self Care Home Management;Cryotherapy;Electrical Stimulation;Functional mobility training;Stair training;Gait training;DME Instruction;Moist Heat;Therapeutic activities;Therapeutic exercise;Balance training;Neuromuscular re-education;Patient/family education;Passive range of motion;Manual techniques;Dry needling;Energy conservation;Splinting;Taping;Orthotic Fit/Training;Vasopneumatic Device;Vestibular    PT Next Visit Plan  continue challenging standing balance, work on gaze stabilization     Consulted and Agree with Plan of Care  Patient       Patient will benefit from skilled therapeutic intervention in order to improve the following deficits and impairments:  Abnormal gait, Decreased endurance, Decreased activity tolerance, Decreased strength, Pain, Decreased balance, Decreased mobility, Difficulty walking, Increased muscle spasms, Improper body mechanics, Decreased range of motion, Decreased safety awareness, Impaired flexibility, Postural dysfunction, Dizziness  Visit Diagnosis: Paralytic gait  Difficulty in walking, not elsewhere classified  Muscle weakness (generalized)  Unsteadiness on feet  Dizziness and  giddiness  Other symptoms and signs involving the musculoskeletal system     Problem List Patient Active Problem List   Diagnosis Date Noted  . B12 deficiency 12/19/2016  . Chest pain 12/18/2016  . Cerebellar infarct ([place])   . Diastolic dysfunction   . Marijuana abuse   . History of tobacco abuse   . Benign essential HTN   . Tachycardia   . Hypokalemia   . Stroke (cerebrum) ([place]) 12/05/2016  . Nonintractable headache 12/05/2016  . Occipital stroke ([place]) 12/05/2016  . Hypertensive urgency 12/05/2016  . Hypertension     Janene Harvey, PT, DPT 08/08/18 10:56 AM    [facility] [address]  [place], [place], [address] Phone: [phone]   Fax:  [phone]  Name: Brittany [name] MRN: [id number] Date of Birth: Sep 13, 1965

## 2018-08-09 ENCOUNTER — Other Ambulatory Visit: Payer: Self-pay | Admitting: Family Medicine

## 2018-08-09 ENCOUNTER — Other Ambulatory Visit: Payer: Self-pay

## 2018-08-09 ENCOUNTER — Telehealth: Payer: Self-pay

## 2018-08-09 DIAGNOSIS — E876 Hypokalemia: Secondary | ICD-10-CM

## 2018-08-09 MED ORDER — ATORVASTATIN CALCIUM 40 MG PO TABS: 40.0000 mg | ORAL_TABLET | Freq: Every day | ORAL | 3 refills | Status: DC

## 2018-08-09 MED ORDER — POTASSIUM CHLORIDE ER 20 MEQ PO TBCR: 20.0000 meq | EXTENDED_RELEASE_TABLET | Freq: Every day | ORAL | 3 refills | Status: DC

## 2018-08-09 NOTE — Progress Notes (Signed)
Medication sent to pharmacy  

## 2018-08-09 NOTE — Telephone Encounter (Signed)
Medication sent to pharmacy  

## 2018-08-09 NOTE — Telephone Encounter (Signed)
Patient notified

## 2018-08-13 ENCOUNTER — Encounter: Payer: Self-pay | Admitting: Physical Medicine & Rehabilitation

## 2018-08-14 ENCOUNTER — Encounter: Payer: Medicaid Other | Admitting: Physical Medicine & Rehabilitation

## 2018-08-15 ENCOUNTER — Other Ambulatory Visit: Payer: Self-pay

## 2018-08-15 ENCOUNTER — Ambulatory Visit: Payer: Medicaid Other

## 2018-08-15 DIAGNOSIS — R42 Dizziness and giddiness: Secondary | ICD-10-CM

## 2018-08-15 DIAGNOSIS — M6281 Muscle weakness (generalized): Secondary | ICD-10-CM

## 2018-08-15 DIAGNOSIS — R262 Difficulty in walking, not elsewhere classified: Secondary | ICD-10-CM | POA: Diagnosis not present

## 2018-08-15 DIAGNOSIS — R2681 Unsteadiness on feet: Secondary | ICD-10-CM | POA: Diagnosis not present

## 2018-08-15 DIAGNOSIS — R29898 Other symptoms and signs involving the musculoskeletal system: Secondary | ICD-10-CM

## 2018-08-15 DIAGNOSIS — R261 Paralytic gait: Secondary | ICD-10-CM | POA: Diagnosis not present

## 2018-08-15 NOTE — Therapy (Signed)
[facility] [address]  [place], [place], [address] Phone: [phone]   Fax:  [phone]  Physical Therapy Treatment  Patient Details  Name: Brittany [name] MRN: [id number] Date of Birth: 07/24/1965 Referring Provider (PT): Kathe Becton, FNP   Encounter Date: 08/15/2018  PT End of Session - 08/15/18 0909    Visit Number  12    Number of Visits  16    Date for PT Re-Evaluation  09/12/18    Authorization Type  Medicaid    Authorization Time Period  08/08/18 - 09/18/18    Authorization - Visit Number  2    Authorization - Number of Visits  6    PT Start Time  0904    PT Stop Time  0952    PT Time Calculation (min)  48 min    Activity Tolerance  Patient tolerated treatment well;Patient limited by fatigue    Behavior During Therapy  [facility] for tasks assessed/performed       Past Medical History:  Diagnosis Date  . Anemia   . Elevated cholesterol   . Female bladder prolapse   . Hiatal hernia   . Hypertension   . IBS (irritable bowel syndrome)   . Multiple myeloma ([place])   . Peripheral neuropathy   . Stroke [facility])     Past Surgical History:  Procedure Laterality Date  . BONE BIOPSY    . COLONOSCOPY     over 10 years x3  . ESOPHAGOGASTRODUODENOSCOPY     incomplete-over 10 years ago   . TEE WITHOUT CARDIOVERSION N/A 12/08/2016   Procedure: TRANSESOPHAGEAL ECHOCARDIOGRAM (TEE);  Surgeon: Sueanne Margarita, MD;  Location: [facility] ENDOSCOPY;  Service: Cardiovascular;  Laterality: N/A;  . TUBAL LIGATION    . WISDOM TOOTH EXTRACTION      There were no vitals filed for this visit.  Subjective Assessment - 08/15/18 0908    Subjective  Pt. reporting increased L UE/LE achiness which she attributes to weather change.      Pertinent History  stroke, peripheral neuropathy, multiple myeloma, HTN, hiatal hernia, TEE with cardioversion    Diagnostic tests  none recent    Patient Stated Goals  work on leg strength     Currently in Pain?  Yes    Pain Score  6     Pain Location  Leg    Pain Orientation  Left    Pain Descriptors / Indicators  Aching    Pain Type  Chronic pain    Pain Onset  More than a month ago    Multiple Pain Sites  No    Pain Score  6    Pain Location  Arm    Pain Orientation  Left    Pain Descriptors / Indicators  Aching    Pain Type  Chronic pain    Pain Onset  More than a month ago    Pain Frequency  Constant                       [facility] Adult PT Treatment/Exercise - 08/15/18 0001      Neuro Re-ed    Neuro Re-ed Details   Alternating cone nock over/righting with R UE HH assist from therapist 2 x 7 cones each LE; sitting rest break taken between sets at mat table       Knee/Hip Exercises: Aerobic   Nustep  L4 x 6 min (B UE/LE)  - PTA discussing pt.  current HEP performance/adherence and tolerance       Knee/Hip Exercises: Standing   Heel Raises  Both;20 reps    Heel Raises Limitations  Heel raise/toe raise; 1 hand support at TM rail     Knee Flexion  Right;Left;10 reps;Strengthening   at TM rail    Knee Flexion Limitations  2#    Hip Flexion  Right;Left;10 reps;Knee bent    Hip Flexion Limitations  1#; to ~ 12" box toe-clears     Forward Step Up  Right;Left;1 set;Step Height: 6";Hand Hold: 1   x 12 reps    Forward Step Up Limitations  1 RW support; x 12 reps     Other Standing Knee Exercises  Side stepping with yellow TB at ankles and two HH assistande from therapist 2 x 20 ft       Knee/Hip Exercises: Seated   Long Arc Quad  Right;Left;10 reps    Long Arc Quad Weight  2 lbs.   3" hold    Long Arc Quad Limitations  3" hold                PT Short Term Goals - 08/01/18 1028      PT SHORT TERM GOAL #1   Title  Patient to be independent with initial HEP.    Time  1    Period  Weeks    Status  Achieved    Target Date  05/28/18        PT Long Term Goals - 08/01/18 1029      PT LONG TERM GOAL #1   Title  Patient to be independent with  advanced HEP.    Time  6    Period  Weeks    Status  Partially Met   met for current   Target Date  09/12/18      PT LONG TERM GOAL #2   Title  Patient to score B LE strength atleast 3+/5.     Time  6    Period  Weeks    Status  Achieved      PT LONG TERM GOAL #3   Title  Patient to improve TUG score by 10 sec in order to make progress towards decreased risk of falls.    Baseline  05/21/18 TUG 25.7 sec    Time  3    Period  Weeks    Status  Achieved   06/11/18 15.5 sec      PT LONG TERM GOAL #4   Title  Patient to improve 5xSTS score by 10 sec in order to make progress towards decreased risk of falls.    Baseline  05/21/18 5xSTS 28.9 sec with B UE support    Time  6    Period  Weeks    Status  Partially Met   08/01/18: 25.3 sec   Target Date  09/12/18      PT LONG TERM GOAL #5   Title  Patient to report tolerance of standing/walking for 15 min before pain or fatigue limit her.     Time  6    Period  Weeks    Status  Partially Met   10 min at this time   Target Date  09/12/18      PT LONG TERM GOAL #6   Title  Patient to demonstrate 0 deg of B ankle AROM to decrease risk of falls.     Time  3    Period  Weeks  Status  Achieved      PT LONG TERM GOAL #7   Title  Patient to score <14 sec on TUG with LRAD to decrease risk of falls.     Time  6    Period  Weeks    Status  On-going   08/01/18: 18.9 sec with 4WW   Target Date  09/12/18      PT LONG TERM GOAL #8   Title  Patient to demonstrate 10 deg of B ankle AROM to decrease risk of falls.     Time  6    Period  Weeks    Status  Achieved      PT LONG TERM GOAL  #9   TITLE  Patient to report <2/10 dizziness and mild sway with standing horizontal and vertical head turns while gaze fixed on target.     Time  6    Period  Weeks    Status  On-going   c/o trouble focusing with vertical & horizontal head turns with walker support and unsteadiness   Target Date  09/12/18            Plan - 08/15/18 0910     Clinical Impression Statement  Brittany [name] reporting increased L-sided UE/LE soreness today however attributes this to weather change.  Tolerated all standing balance and LE strengthening activities focused on SLS stability well today.  Also focused on LE toe-clears with 1# at ankle to ~ 12" box as pt. still having some difficulty stepping over shower seal.  Denies need for HEP update reporting exercises are still challenging.  Ended visit with pt. noting LE fatigue.  Noted improved confidence and stability with 6" step-ups today.  Will continue to progress toward goals.      Personal Factors and Comorbidities  Comorbidity 3+;Social Background;Time since onset of injury/illness/exacerbation;Finances    Comorbidities  stroke, peripheral neuropathy, multiple myeloma, HTN, hiatal hernia, TEE with cardioversion    Examination-Activity Limitations  Bathing;Bend;Carry;Squat;Stairs;Stand;Lift;Transfers;Locomotion Level    Examination-Participation Restrictions  Cleaning;Community Activity;Shop;Driving;Yard Work;Interpersonal Relationship;Laundry;Meal Prep    PT Treatment/Interventions  ADLs/Self Care Home Management;Cryotherapy;Electrical Stimulation;Functional mobility training;Stair training;Gait training;DME Instruction;Moist Heat;Therapeutic activities;Therapeutic exercise;Balance training;Neuromuscular re-education;Patient/family education;Passive range of motion;Manual techniques;Dry needling;Energy conservation;Splinting;Taping;Orthotic Fit/Training;Vasopneumatic Device;Vestibular    PT Next Visit Plan  continue challenging standing balance, work on gaze stabilization     Consulted and Agree with Plan of Care  Patient       Patient will benefit from skilled therapeutic intervention in order to improve the following deficits and impairments:  Abnormal gait, Decreased endurance, Decreased activity tolerance, Decreased strength, Pain, Decreased balance, Decreased mobility, Difficulty walking, Increased muscle  spasms, Improper body mechanics, Decreased range of motion, Decreased safety awareness, Impaired flexibility, Postural dysfunction, Dizziness  Visit Diagnosis: Paralytic gait  Difficulty in walking, not elsewhere classified  Muscle weakness (generalized)  Unsteadiness on feet  Dizziness and giddiness  Other symptoms and signs involving the musculoskeletal system     Problem List Patient Active Problem List   Diagnosis Date Noted  . B12 deficiency 12/19/2016  . Chest pain 12/18/2016  . Cerebellar infarct ([place])   . Diastolic dysfunction   . Marijuana abuse   . History of tobacco abuse   . Benign essential HTN   . Tachycardia   . Hypokalemia   . Stroke (cerebrum) ([place]) 12/05/2016  . Nonintractable headache 12/05/2016  . Occipital stroke ([place]) 12/05/2016  . Hypertensive urgency 12/05/2016  . Hypertension     Bess Harvest, PTA 08/15/18 10:03 AM  [facility] [address]  [place], [place], [address] Phone: [phone]   Fax:  [phone]  Name: Brittany [name] MRN: [id number] Date of Birth: 12/14/1965

## 2018-08-20 ENCOUNTER — Ambulatory Visit: Payer: Medicaid Other

## 2018-08-20 ENCOUNTER — Other Ambulatory Visit: Payer: Self-pay

## 2018-08-20 VITALS — Ht 61.0 in | Wt 170.0 lb

## 2018-08-20 DIAGNOSIS — R1032 Left lower quadrant pain: Secondary | ICD-10-CM

## 2018-08-20 DIAGNOSIS — K219 Gastro-esophageal reflux disease without esophagitis: Secondary | ICD-10-CM

## 2018-08-20 MED ORDER — NA SULFATE-K SULFATE-MG SULF 17.5-3.13-1.6 GM/177ML PO SOLN: 1.0000 | Freq: Once | ORAL | 0 refills | Status: AC

## 2018-08-20 NOTE — Progress Notes (Signed)
Per pt, no allergies to soy or egg products.Pt not taking any weight loss meds or using  O2 at home. Pt states she has a hard time waking up past sedation!  The PV was done over the phone due to COVID-19. I verified the pt's insurance and address with her. I reviewed medical history and prep instructions with the pt and will mail paperwork to her today. The pt was informed to call if she has further questions or changes prior to her procedure. She understood.

## 2018-08-21 ENCOUNTER — Other Ambulatory Visit: Payer: Medicaid Other

## 2018-08-22 ENCOUNTER — Encounter: Payer: Self-pay | Admitting: Physical Therapy

## 2018-08-22 ENCOUNTER — Other Ambulatory Visit: Payer: Self-pay

## 2018-08-22 ENCOUNTER — Ambulatory Visit: Payer: Medicaid Other | Attending: Family Medicine | Admitting: Physical Therapy

## 2018-08-22 DIAGNOSIS — R2681 Unsteadiness on feet: Secondary | ICD-10-CM | POA: Insufficient documentation

## 2018-08-22 DIAGNOSIS — R262 Difficulty in walking, not elsewhere classified: Secondary | ICD-10-CM | POA: Diagnosis present

## 2018-08-22 DIAGNOSIS — R29898 Other symptoms and signs involving the musculoskeletal system: Secondary | ICD-10-CM | POA: Insufficient documentation

## 2018-08-22 DIAGNOSIS — M6281 Muscle weakness (generalized): Secondary | ICD-10-CM

## 2018-08-22 DIAGNOSIS — R42 Dizziness and giddiness: Secondary | ICD-10-CM | POA: Diagnosis present

## 2018-08-22 DIAGNOSIS — R261 Paralytic gait: Secondary | ICD-10-CM | POA: Diagnosis present

## 2018-08-22 NOTE — Therapy (Signed)
[facility] [address]  [place], [place], [address] Phone: [phone]   Fax:  [phone]  Physical Therapy Treatment  Patient Details  Name: Brittany [name] MRN: [id number] Date of Birth: 03/24/1965 Referring Provider (PT): Kathe Becton, FNP   Encounter Date: 08/22/2018  PT End of Session - 08/22/18 0949    Visit Number  13    Number of Visits  16    Date for PT Re-Evaluation  09/12/18    Authorization Type  Medicaid    Authorization Time Period  08/08/18 - 09/18/18    Authorization - Visit Number  3    Authorization - Number of Visits  6    PT Start Time  0904    PT Stop Time  0945    PT Time Calculation (min)  41 min    Equipment Utilized During Treatment  Gait belt    Activity Tolerance  Patient tolerated treatment well;Patient limited by fatigue    Behavior During Therapy  [facility] for tasks assessed/performed       Past Medical History:  Diagnosis Date  . Anemia   . Arthritis    trigger finger in left hand  . Complication of anesthesia    per pt, hard to wake up!  . Elevated cholesterol   . Female bladder prolapse    per urologist, does not have prolaspe  . GERD (gastroesophageal reflux disease)   . Heart murmur    pt unsure.  . Hiatal hernia   . Hypertension   . IBS (irritable bowel syndrome)   . Multiple myeloma ([place]) 2005   had partial chemo  . Peripheral neuropathy   . SOB (shortness of breath) on exertion    uses an inhaler  . Stroke [facility] 2017   paralysis left arm/uses a walker    Past Surgical History:  Procedure Laterality Date  . BONE BIOPSY  2005   in her back  . COLONOSCOPY     over 10 years x3  . ESOPHAGOGASTRODUODENOSCOPY     incomplete-over 10 years ago   . TEE WITHOUT CARDIOVERSION N/A 12/08/2016   Procedure: TRANSESOPHAGEAL ECHOCARDIOGRAM (TEE);  Surgeon: Sueanne Margarita, MD;  Location: [facility] ENDOSCOPY;  Service: Cardiovascular;  Laterality: N/A;  . TUBAL LIGATION    .  WISDOM TOOTH EXTRACTION      There were no vitals filed for this visit.  Subjective Assessment - 08/22/18 0906    Subjective  Reports not much is new. Denies falls since last session. Has been gardening more lately.     Pertinent History  stroke, peripheral neuropathy, multiple myeloma, HTN, hiatal hernia, TEE with cardioversion    Diagnostic tests  none recent    Patient Stated Goals  work on leg strength    Currently in Pain?  Yes    Pain Score  4    Pain Location  Arm    Pain Orientation  Left    Pain Descriptors / Indicators  Burning;Tingling    Pain Type  Chronic pain                       [facility] Adult PT Treatment/Exercise - 08/22/18 0001      Neuro Re-ed    Neuro Re-ed Details   tandem walk 2x length of counter top with 1 UE support      Knee/Hip Exercises: Stretches   Active Hamstring Stretch  Right;Left;2 reps;20 seconds    Active Hamstring Stretch  Limitations  sitting HS stretch with heel propped on stool    Other Knee/Hip Stretches  R & L hip hike on 4" step with B UE support x10 each side      Knee/Hip Exercises: Aerobic   Nustep  L4 x 6 min (UE/LEs)      Knee/Hip Exercises: Standing   Knee Flexion  Right;Left;Strengthening;15 reps    Knee Flexion Limitations  3# HS curls at counter top    Hip Abduction  Left;Right;Knee straight;Stengthening;1 set;10 reps    Abduction Limitations  2# at counter top    Hip Extension  Stengthening;Right;Left;1 set;10 reps;Knee straight    Extension Limitations  2# at counter top   cues for TKE   SLS  R & L SLS on foam w/ R UE support on counter top 2x30" each   cues for lateral hip activation and TKE   Other Standing Knee Exercises  alt foot tap on 8" step 3x30" with 1 UE support and CGA   slow and heavy UE support on counter top              PT Short Term Goals - 08/01/18 1028      PT SHORT TERM GOAL #1   Title  Patient to be independent with initial HEP.    Time  1    Period  Weeks    Status   Achieved    Target Date  05/28/18        PT Long Term Goals - 08/01/18 1029      PT LONG TERM GOAL #1   Title  Patient to be independent with advanced HEP.    Time  6    Period  Weeks    Status  Partially Met   met for current   Target Date  09/12/18      PT LONG TERM GOAL #2   Title  Patient to score B LE strength atleast 3+/5.     Time  6    Period  Weeks    Status  Achieved      PT LONG TERM GOAL #3   Title  Patient to improve TUG score by 10 sec in order to make progress towards decreased risk of falls.    Baseline  05/21/18 TUG 25.7 sec    Time  3    Period  Weeks    Status  Achieved   06/11/18 15.5 sec      PT LONG TERM GOAL #4   Title  Patient to improve 5xSTS score by 10 sec in order to make progress towards decreased risk of falls.    Baseline  05/21/18 5xSTS 28.9 sec with B UE support    Time  6    Period  Weeks    Status  Partially Met   08/01/18: 25.3 sec   Target Date  09/12/18      PT LONG TERM GOAL #5   Title  Patient to report tolerance of standing/walking for 15 min before pain or fatigue limit her.     Time  6    Period  Weeks    Status  Partially Met   10 min at this time   Target Date  09/12/18      PT LONG TERM GOAL #6   Title  Patient to demonstrate 0 deg of B ankle AROM to decrease risk of falls.     Time  3    Period  Weeks    Status  Achieved      PT LONG TERM GOAL #7   Title  Patient to score <14 sec on TUG with LRAD to decrease risk of falls.     Time  6    Period  Weeks    Status  On-going   08/01/18: 18.9 sec with 4WW   Target Date  09/12/18      PT LONG TERM GOAL #8   Title  Patient to demonstrate 10 deg of B ankle AROM to decrease risk of falls.     Time  6    Period  Weeks    Status  Achieved      PT LONG TERM GOAL  #9   TITLE  Patient to report <2/10 dizziness and mild sway with standing horizontal and vertical head turns while gaze fixed on target.     Time  6    Period  Weeks    Status  On-going   c/o trouble  focusing with vertical & horizontal head turns with walker support and unsteadiness   Target Date  09/12/18            Plan - 08/22/18 0949    Clinical Impression Statement  Patient arrived to session with no new complaints. Patient reporting that she has started gardening for the first time since her stroke and feels that she would not have been able to return to this activity had she not had PT. Worked on dynamic LE strengthening in standing today with good effort by patient. Able to tolerate increased resistance with hamstring curls. Patient demonstrating tendency for hip drop and lateral trunk lean with single leg exercises. Thus, worked on hip hiking with patient showing some difficulty but good technique. Ended session with no complaints. Patient progressing towards goals.     Comorbidities  stroke, peripheral neuropathy, multiple myeloma, HTN, hiatal hernia, TEE with cardioversion    PT Treatment/Interventions  ADLs/Self Care Home Management;Cryotherapy;Electrical Stimulation;Functional mobility training;Stair training;Gait training;DME Instruction;Moist Heat;Therapeutic activities;Therapeutic exercise;Balance training;Neuromuscular re-education;Patient/family education;Passive range of motion;Manual techniques;Dry needling;Energy conservation;Splinting;Taping;Orthotic Fit/Training;Vasopneumatic Device;Vestibular    PT Next Visit Plan  continue challenging standing balance, work on gaze stabilization     Consulted and Agree with Plan of Care  Patient       Patient will benefit from skilled therapeutic intervention in order to improve the following deficits and impairments:  Abnormal gait, Decreased endurance, Decreased activity tolerance, Decreased strength, Pain, Decreased balance, Decreased mobility, Difficulty walking, Increased muscle spasms, Improper body mechanics, Decreased range of motion, Decreased safety awareness, Impaired flexibility, Postural dysfunction, Dizziness  Visit  Diagnosis: Paralytic gait  Difficulty in walking, not elsewhere classified  Muscle weakness (generalized)  Unsteadiness on feet  Dizziness and giddiness  Other symptoms and signs involving the musculoskeletal system     Problem List Patient Active Problem List   Diagnosis Date Noted  . B12 deficiency 12/19/2016  . Chest pain 12/18/2016  . Cerebellar infarct ([place])   . Diastolic dysfunction   . Marijuana abuse   . History of tobacco abuse   . Benign essential HTN   . Tachycardia   . Hypokalemia   . Stroke (cerebrum) ([place]) 12/05/2016  . Nonintractable headache 12/05/2016  . Occipital stroke ([place]) 12/05/2016  . Hypertensive urgency 12/05/2016  . Hypertension      Janene Harvey, PT, DPT 08/22/18 9:53 AM   [facility] [address]  [place], [place], [address] Phone: [phone]   Fax:  [phone]  Name:  Brittany [name] MRN: [id number] Date of Birth: 1965/05/06

## 2018-08-26 NOTE — Telephone Encounter (Signed)
Peer to peer scheduled 08/27/18 at 930am with Dr. Bailey Mech.

## 2018-08-26 NOTE — Telephone Encounter (Signed)
Medicaid did not approve the MRI Cervical spine.  "Based on [employer] guidelines. Your records show that you have neck pain. The reason this request cannot be approved is because 1. Guidelines may support imaging in the evaluation of suspected or known spinal disease with one of the following. Failure to improve after a recent (within three months) six week trial of physician-guided clinical care with clinical re-evaluation. Any signs or symptoms such as significant motor weakness, malignancy, infections, cauda equina syndrome, for which conservative treatment is not needed. The clinical information received fails to support this"  If you would like to do a peer to peer the phone number is [phone] option 4. It would have be scheduled by Thursday 08/29/18 and the case number is [id number].

## 2018-08-27 ENCOUNTER — Telehealth: Payer: Self-pay | Admitting: Adult Health

## 2018-08-27 NOTE — Telephone Encounter (Signed)
Peer-to-peer insurance approval for MRI cervical spine completed on 08/27/2018 at 9:30 AM with Dr. Bailey Mech.  Approval confirmation [id number]

## 2018-08-27 NOTE — Telephone Encounter (Signed)
Noted, thank you

## 2018-08-29 ENCOUNTER — Encounter: Payer: Self-pay | Admitting: Physical Therapy

## 2018-08-29 ENCOUNTER — Other Ambulatory Visit: Payer: Self-pay

## 2018-08-29 ENCOUNTER — Ambulatory Visit: Payer: Medicaid Other | Admitting: Physical Therapy

## 2018-08-29 DIAGNOSIS — R29898 Other symptoms and signs involving the musculoskeletal system: Secondary | ICD-10-CM | POA: Diagnosis not present

## 2018-08-29 DIAGNOSIS — R2681 Unsteadiness on feet: Secondary | ICD-10-CM

## 2018-08-29 DIAGNOSIS — R262 Difficulty in walking, not elsewhere classified: Secondary | ICD-10-CM

## 2018-08-29 DIAGNOSIS — R42 Dizziness and giddiness: Secondary | ICD-10-CM

## 2018-08-29 DIAGNOSIS — R261 Paralytic gait: Secondary | ICD-10-CM

## 2018-08-29 DIAGNOSIS — M6281 Muscle weakness (generalized): Secondary | ICD-10-CM | POA: Diagnosis not present

## 2018-08-29 NOTE — Therapy (Signed)
[facility] [address]  [place], [place], [address] Phone: [phone]   Fax:  [phone]  Physical Therapy Treatment  Patient Details  Name: Brittany [name] MRN: [id number] Date of Birth: 1965/06/25 Referring Provider (PT): Kathe Becton, FNP   Encounter Date: 08/29/2018  PT End of Session - 08/29/18 1043    Visit Number  14    Number of Visits  16    Date for PT Re-Evaluation  09/12/18    Authorization Type  Medicaid    Authorization Time Period  08/08/18 - 09/18/18    Authorization - Visit Number  4    Authorization - Number of Visits  6    PT Start Time  [phone]    Equipment Utilized During Treatment  Gait belt    Activity Tolerance  Patient tolerated treatment well;Patient limited by fatigue    Behavior During Therapy  [facility] for tasks assessed/performed       Past Medical History:  Diagnosis Date  . Anemia   . Arthritis    trigger finger in left hand  . Complication of anesthesia    per pt, hard to wake up!  . Elevated cholesterol   . Female bladder prolapse    per urologist, does not have prolaspe  . GERD (gastroesophageal reflux disease)   . Heart murmur    pt unsure.  . Hiatal hernia   . Hypertension   . IBS (irritable bowel syndrome)   . Multiple myeloma ([place]) 2005   had partial chemo  . Peripheral neuropathy   . SOB (shortness of breath) on exertion    uses an inhaler  . Stroke [facility]) 2017   paralysis left arm/uses a walker    Past Surgical History:  Procedure Laterality Date  . BONE BIOPSY  2005   in her back  . COLONOSCOPY     over 10 years x3  . ESOPHAGOGASTRODUODENOSCOPY     incomplete-over 10 years ago   . TEE WITHOUT CARDIOVERSION N/A 12/08/2016   Procedure: TRANSESOPHAGEAL ECHOCARDIOGRAM (TEE);  Surgeon: Sueanne Margarita, MD;  Location: [facility] ENDOSCOPY;  Service: Cardiovascular;  Laterality: N/A;  . TUBAL LIGATION    . WISDOM TOOTH EXTRACTION      There were no vitals filed for  this visit.  Subjective Assessment - 08/29/18 1001    Subjective  Reports that she has been having lower leg swelling in B legs and feet and is scheduled to see an MD about this.    Pertinent History  stroke, peripheral neuropathy, multiple myeloma, HTN, hiatal hernia, TEE with cardioversion    Diagnostic tests  none recent    Patient Stated Goals  work on leg strength    Currently in Pain?  Yes    Pain Score  5    Pain Location  Arm    Pain Orientation  Left    Pain Descriptors / Indicators  Burning;Tingling    Pain Type  Chronic pain                       [facility] Adult PT Treatment/Exercise - 08/29/18 0001      Ambulation/Gait   Ambulation Distance (Feet)  90 Feet    Assistive device  None    Gait Pattern  Step-through pattern;Poor foot clearance - right;Poor foot clearance - left;Trunk flexed;Decreased step length - right;Decreased step length - left;Ataxic;Narrow base of support;Decreased arm swing - right;Decreased arm swing - left;Trendelenburg   slight  ataxia with foot placement, narrow stance, slow   Ambulation Surface  Level;Indoor    Gait velocity  decreased    Pre-Gait Activities  R & L forward/back steping with CGA- visible extension thrust in B knees and general instability    Gait Comments  demonstrated narrow stance which requiring cueing to correct; also with decreased toe clearance as patient got fatigued; extension thrust observable throughout      Neuro Re-ed    Neuro Re-ed Details   cross body reach to cone with CGA/min A x10 each side      Knee/Hip Exercises: Stretches   Gastroc Stretch  Right;30 seconds;3 reps;Left    Gastroc Stretch Limitations  staggered at counter top   to relieve muscle spasm     Knee/Hip Exercises: Aerobic   Nustep  L4 x 6 min (UE/LEs)      Knee/Hip Exercises: Standing   Heel Raises  Right;Left;1 set;10 reps    Heel Raises Limitations  B concentric/R & L eccentric at counter top             PT Education -  08/29/18 1042    Education Details  update to HEP    Person(s) Educated  Patient    Methods  Explanation;Demonstration;Tactile cues;Verbal cues    Comprehension  Verbalized understanding;Returned demonstration       PT Short Term Goals - 08/01/18 1028      PT SHORT TERM GOAL #1   Title  Patient to be independent with initial HEP.    Time  1    Period  Weeks    Status  Achieved    Target Date  05/28/18        PT Long Term Goals - 08/01/18 1029      PT LONG TERM GOAL #1   Title  Patient to be independent with advanced HEP.    Time  6    Period  Weeks    Status  Partially Met   met for current   Target Date  09/12/18      PT LONG TERM GOAL #2   Title  Patient to score B LE strength atleast 3+/5.     Time  6    Period  Weeks    Status  Achieved      PT LONG TERM GOAL #3   Title  Patient to improve TUG score by 10 sec in order to make progress towards decreased risk of falls.    Baseline  05/21/18 TUG 25.7 sec    Time  3    Period  Weeks    Status  Achieved   06/11/18 15.5 sec      PT LONG TERM GOAL #4   Title  Patient to improve 5xSTS score by 10 sec in order to make progress towards decreased risk of falls.    Baseline  05/21/18 5xSTS 28.9 sec with B UE support    Time  6    Period  Weeks    Status  Partially Met   08/01/18: 25.3 sec   Target Date  09/12/18      PT LONG TERM GOAL #5   Title  Patient to report tolerance of standing/walking for 15 min before pain or fatigue limit her.     Time  6    Period  Weeks    Status  Partially Met   10 min at this time   Target Date  09/12/18      PT LONG TERM GOAL #  6   Title  Patient to demonstrate 0 deg of B ankle AROM to decrease risk of falls.     Time  3    Period  Weeks    Status  Achieved      PT LONG TERM GOAL #7   Title  Patient to score <14 sec on TUG with LRAD to decrease risk of falls.     Time  6    Period  Weeks    Status  On-going   08/01/18: 18.9 sec with 4WW   Target Date  09/12/18      PT  LONG TERM GOAL #8   Title  Patient to demonstrate 10 deg of B ankle AROM to decrease risk of falls.     Time  6    Period  Weeks    Status  Achieved      PT LONG TERM GOAL  #9   TITLE  Patient to report <2/10 dizziness and mild sway with standing horizontal and vertical head turns while gaze fixed on target.     Time  6    Period  Weeks    Status  On-going   c/o trouble focusing with vertical & horizontal head turns with walker support and unsteadiness   Target Date  09/12/18            Plan - 08/29/18 1045    Clinical Impression Statement  Patient arrived to session with report of swelling in B lower legs- has an MD appointment to address this issue. Educated patient on ankle pumps to help with dependent swelling- patient reported understanding. Worked on pre-gait and gait training without use of AD today in order to challenge balance and stability. Patient able to tolerate 51f of ambulation without AD and with CGA- intermittently taking standing or sitting rest breaks d/t fatigue. Patient with observable B extensor thrust in B knees throughout gait and with decreased toe clearance as she got more fatigued- thus believe patient would benefit from AFO for improved knee stability and toe clearance for safety and prevention of injury. Patient agreeable. Able to perform progressive calf strengthening today with good tolerance. Required gastroc stretch for relief of muscle soreness. Ended session without complaints. Patient showing excellent progress towards goals.    Comorbidities  stroke, peripheral neuropathy, multiple myeloma, HTN, hiatal hernia, TEE with cardioversion    PT Treatment/Interventions  ADLs/Self Care Home Management;Cryotherapy;Electrical Stimulation;Functional mobility training;Stair training;Gait training;DME Instruction;Moist Heat;Therapeutic activities;Therapeutic exercise;Balance training;Neuromuscular re-education;Patient/family education;Passive range of motion;Manual  techniques;Dry needling;Energy conservation;Splinting;Taping;Orthotic Fit/Training;Vasopneumatic Device;Vestibular    PT Next Visit Plan  continue challenging standing balance & gait, work on gaze stabilization    Consulted and Agree with Plan of Care  Patient       Patient will benefit from skilled therapeutic intervention in order to improve the following deficits and impairments:  Abnormal gait, Decreased endurance, Decreased activity tolerance, Decreased strength, Pain, Decreased balance, Decreased mobility, Difficulty walking, Increased muscle spasms, Improper body mechanics, Decreased range of motion, Decreased safety awareness, Impaired flexibility, Postural dysfunction, Dizziness  Visit Diagnosis: Paralytic gait  Difficulty in walking, not elsewhere classified  Muscle weakness (generalized)  Unsteadiness on feet  Dizziness and giddiness  Other symptoms and signs involving the musculoskeletal system     Problem List Patient Active Problem List   Diagnosis Date Noted  . B12 deficiency 12/19/2016  . Chest pain 12/18/2016  . [place]   . Diastolic dysfunction   . Marijuana abuse   . History of tobacco  abuse   . Benign essential HTN   . Tachycardia   . Hypokalemia   . Stroke (cerebrum) ([place]) 12/05/2016  . Nonintractable headache 12/05/2016  . Occipital stroke ([place]) 12/05/2016  . Hypertensive urgency 12/05/2016  . Hypertension      Janene Harvey, PT, DPT 08/29/18 10:52 AM   [facility] [address]  [place], [place], [address] Phone: [phone]   Fax:  [phone]  Name: Brittany [name] MRN: [id number] Date of Birth: Sep 15, 1965

## 2018-08-29 NOTE — Patient Instructions (Signed)
Updated HEP with B concentric/R & L eccentric heel raises

## 2018-08-30 ENCOUNTER — Telehealth: Payer: Self-pay

## 2018-08-30 NOTE — Telephone Encounter (Signed)
Pt returned your call.  

## 2018-08-30 NOTE — Telephone Encounter (Signed)
Left message and gave a call back number

## 2018-09-02 ENCOUNTER — Ambulatory Visit (AMBULATORY_SURGERY_CENTER): Payer: Medicaid Other | Admitting: Gastroenterology

## 2018-09-02 ENCOUNTER — Encounter: Payer: Self-pay | Admitting: Gastroenterology

## 2018-09-02 ENCOUNTER — Other Ambulatory Visit: Payer: Self-pay

## 2018-09-02 VITALS — BP 115/62 | HR 86 | Temp 98.6°F | Resp 16 | Ht 61.0 in | Wt 170.0 lb

## 2018-09-02 DIAGNOSIS — K449 Diaphragmatic hernia without obstruction or gangrene: Secondary | ICD-10-CM | POA: Diagnosis not present

## 2018-09-02 DIAGNOSIS — K648 Other hemorrhoids: Secondary | ICD-10-CM | POA: Diagnosis not present

## 2018-09-02 DIAGNOSIS — K581 Irritable bowel syndrome with constipation: Secondary | ICD-10-CM

## 2018-09-02 DIAGNOSIS — K219 Gastro-esophageal reflux disease without esophagitis: Secondary | ICD-10-CM

## 2018-09-02 DIAGNOSIS — D125 Benign neoplasm of sigmoid colon: Secondary | ICD-10-CM | POA: Diagnosis not present

## 2018-09-02 DIAGNOSIS — K6389 Other specified diseases of intestine: Secondary | ICD-10-CM | POA: Diagnosis not present

## 2018-09-02 DIAGNOSIS — R1032 Left lower quadrant pain: Secondary | ICD-10-CM

## 2018-09-02 DIAGNOSIS — K2970 Gastritis, unspecified, without bleeding: Secondary | ICD-10-CM

## 2018-09-02 DIAGNOSIS — K2950 Unspecified chronic gastritis without bleeding: Secondary | ICD-10-CM | POA: Diagnosis not present

## 2018-09-02 MED ORDER — SODIUM CHLORIDE 0.9 % IV SOLN: 500.0000 mL | Freq: Once | INTRAVENOUS | Status: DC

## 2018-09-02 NOTE — Op Note (Signed)
[place] Patient Name: Brittany [name] Procedure Date: 09/02/2018 1:25 PM MRN: [id number] Endoscopist: Jackquline Denmark , MD Age: 53 Referring MD:  Date of Birth: [birth year] Gender: Female Account #: [id number] Procedure:                Upper GI endoscopy Indications:              Heartburn (refractory) Medicines:                Monitored Anesthesia Care Procedure:                Pre-Anesthesia Assessment:                           - Prior to the procedure, a History and Physical                            was performed, and patient medications and                            allergies were reviewed. The patient's tolerance of                            previous anesthesia was also reviewed. The risks                            and benefits of the procedure and the sedation                            options and risks were discussed with the patient.                            All questions were answered, and informed consent                            was obtained. Prior Anticoagulants: The patient has                            taken no previous anticoagulant or antiplatelet                            agents. ASA Grade Assessment: II - A patient with                            mild systemic disease. After reviewing the risks                            and benefits, the patient was deemed in                            satisfactory condition to undergo the procedure.                           - Prior to the procedure, a History and Physical  was performed, and patient medications and                            allergies were reviewed. The patient's tolerance of                            previous anesthesia was also reviewed. The risks                            and benefits of the procedure and the sedation                            options and risks were discussed with the patient.                            All questions were answered, and  informed consent                            was obtained. Prior Anticoagulants: The patient has                            taken no previous anticoagulant or antiplatelet                            agents. ASA Grade Assessment: II - A patient with                            mild systemic disease. After reviewing the risks                            and benefits, the patient was deemed in                            satisfactory condition to undergo the procedure.                           After obtaining informed consent, the endoscope was                            passed under direct vision. Throughout the                            procedure, the patient's blood pressure, pulse, and                            oxygen saturations were monitored continuously. The                            Endoscope was introduced through the mouth, and                            advanced to the second part of duodenum. The upper  GI endoscopy was accomplished without difficulty.                            The patient tolerated the procedure well. Scope In: Scope Out: Findings:                 The examined esophagus was normal.                           The Z-line was regular and was found 35 cm from the                            incisors. Examined by NBI. No obvious hiatal                            hernias.                           Localized mild inflammation characterized by                            erythema was found in the gastric antrum. Biopsies                            were taken with a cold forceps for histology.                            Estimated blood loss: none.                           The examined duodenum was normal. Complications:            No immediate complications. Estimated Blood Loss:     Estimated blood loss: none. Impression:               -Mild gastritis.                           -Otherwise normal EGD. Recommendation:           - Patient has a  contact number available for                            emergencies. The signs and symptoms of potential                            delayed complications were discussed with the                            patient. Return to normal activities tomorrow.                            Written discharge instructions were provided to the                            patient.                           -  Resume previous diet.                           - Can try to decrease omeprazole to 20 mg p.o. once                            a day.                           - Brochures regarding GERD. Avoid eating 3 hours                            before going to bed. Can raise the head of the bed                            by 6 inches blocks.                           - Await pathology results. Jackquline Denmark, MD 09/02/2018 2:09:11 PM This report has been signed electronically.

## 2018-09-02 NOTE — Progress Notes (Signed)
Called to room to assist during endoscopic procedure.  Patient ID and intended procedure confirmed with present staff. Received instructions for my participation in the procedure from the performing physician.  

## 2018-09-02 NOTE — Patient Instructions (Signed)
Information on polyps, hemorrhoids and gastritis given to you today,  Await pathology results.  Avoid non steriodals medications.  Continue Miralax 17 g by mouth twice a day.  Avoid eating three hours before going to bed and try raising the head of the bed by six inches if possible.  Can try to decrease Omeprazole to 20 mg by mouth once a day.  YOU HAD AN ENDOSCOPIC PROCEDURE TODAY AT [facility]:   Refer to the procedure report that was given to you for any specific questions about what was found during the examination.  If the procedure report does not answer your questions, please call your gastroenterologist to clarify.  If you requested that your care partner not be given the details of your procedure findings, then the procedure report has been included in a sealed envelope for you to review at your convenience later.  YOU SHOULD EXPECT: Some feelings of bloating in the abdomen. Passage of more gas than usual.  Walking can help get rid of the air that was put into your GI tract during the procedure and reduce the bloating. If you had a lower endoscopy (such as a colonoscopy or flexible sigmoidoscopy) you may notice spotting of blood in your stool or on the toilet paper. If you underwent a bowel prep for your procedure, you may not have a normal bowel movement for a few days.  Please Note:  You might notice some irritation and congestion in your nose or some drainage.  This is from the oxygen used during your procedure.  There is no need for concern and it should clear up in a day or so.  SYMPTOMS TO REPORT IMMEDIATELY:   Following lower endoscopy (colonoscopy or flexible sigmoidoscopy):  Excessive amounts of blood in the stool  Significant tenderness or worsening of abdominal pains  Swelling of the abdomen that is new, acute  Fever of 100F or higher   Following upper endoscopy (EGD)  Vomiting of blood or coffee ground material  New chest pain or pain under the  shoulder blades  Painful or persistently difficult swallowing  New shortness of breath  Fever of 100F or higher  Black, tarry-looking stools  For urgent or emergent issues, a gastroenterologist can be reached at any hour by calling [phone].   DIET:  We do recommend a small meal at first, but then you may proceed to your regular diet.  Drink plenty of fluids but you should avoid alcoholic beverages for 24 hours.  ACTIVITY:  You should plan to take it easy for the rest of today and you should NOT DRIVE or use heavy machinery until tomorrow (because of the sedation medicines used during the test).    FOLLOW UP: Our staff will call the number listed on your records 48-72 hours following your procedure to check on you and address any questions or concerns that you may have regarding the information given to you following your procedure. If we do not reach you, we will leave a message.  We will attempt to reach you two times.  During this call, we will ask if you have developed any symptoms of COVID 19. If you develop any symptoms (ie: fever, flu-like symptoms, shortness of breath, cough etc.) before then, please call [phone].  If you test positive for Covid 19 in the 2 weeks post procedure, please call and report this information to [place].    If any biopsies were taken you will be contacted by phone or by letter  within the next 1-3 weeks.  Please call us at [phone] if you have not heard about the biopsies in 3 weeks.    SIGNATURES/CONFIDENTIALITY: You and/or your care partner have signed paperwork which will be entered into your electronic medical record.  These signatures attest to the fact that that the information above on your After Visit Summary has been reviewed and is understood.  Full responsibility of the confidentiality of this discharge information lies with you and/or your care-partner.

## 2018-09-02 NOTE — Progress Notes (Signed)
Pt's states no medical or surgical changes since previsit or office visit.  Temp CW Vitals JB 

## 2018-09-02 NOTE — Progress Notes (Signed)
PT taken to PACU. Monitors in place. VSS. Report given to RN. 

## 2018-09-02 NOTE — Op Note (Signed)
[place] Patient Name: Brittany [name] Procedure Date: 09/02/2018 1:25 PM MRN: [id number] Endoscopist: Jackquline Denmark , MD Age: 53 Referring MD:  Date of Birth: [birth year] Gender: Female Account #: [id number] Procedure:                Colonoscopy Indications:              Screening for colorectal malignant neoplasm.                            Chronic constipation. Medicines:                Monitored Anesthesia Care Procedure:                Pre-Anesthesia Assessment:                           - Prior to the procedure, a History and Physical                            was performed, and patient medications and                            allergies were reviewed. The patient's tolerance of                            previous anesthesia was also reviewed. The risks                            and benefits of the procedure and the sedation                            options and risks were discussed with the patient.                            All questions were answered, and informed consent                            was obtained. Prior Anticoagulants: The patient has                            taken no previous anticoagulant or antiplatelet                            agents. ASA Grade Assessment: II - A patient with                            mild systemic disease. After reviewing the risks                            and benefits, the patient was deemed in                            satisfactory condition to undergo the procedure.  After obtaining informed consent, the colonoscope                            was passed under direct vision. Throughout the                            procedure, the patient's blood pressure, pulse, and                            oxygen saturations were monitored continuously. The                            Colonoscope was introduced through the anus and                            advanced to the 2 cm into the ileum. The                          colonoscopy was performed without difficulty. The                            patient tolerated the procedure well. The quality                            of the bowel preparation was good. The terminal                            ileum, ileocecal valve, appendiceal orifice, and                            rectum were photographed. Scope In: 1:44:40 PM Scope Out: 1:59:22 PM Scope Withdrawal Time: 0 hours 10 minutes 49 seconds  Total Procedure Duration: 0 hours 14 minutes 42 seconds  Findings:                 A 10 mm polyp was found in the distal sigmoid                            colon, 20 cm from the anal verge.. The polyp was                            semi-pedunculated. The polyp was removed with a hot                            snare. Resection and retrieval were complete.                            Estimated blood loss: none.                           Mild melanosis coli especially in the right colon.                            The colon was somewhat redundant.  Non-bleeding internal hemorrhoids were found during                            retroflexion. The hemorrhoids were small.                           The terminal ileum appeared normal.                           The exam was otherwise without abnormality on                            direct and retroflexion views. Complications:            No immediate complications. Estimated Blood Loss:     Estimated blood loss: none. Impression:               -Colonic polyp status post polypectomy.                           -Mild melanosis coli.                           -Mild internal hemorrhoids.                           -Otherwise normal colonoscopy to TI. The colon was                            somewhat redundant. Recommendation:           - Patient has a contact number available for                            emergencies. The signs and symptoms of potential                             delayed complications were discussed with the                            patient. Return to normal activities tomorrow.                            Written discharge instructions were provided to the                            patient.                           - Resume previous diet.                           - Continue present medications.                           - Continue MiraLAX 17 g p.o. twice daily.                           -  Avoid nonsteroidals.                           - Await pathology results.                           - Repeat colonoscopy for surveillance based on                            pathology results.                           - Return to GI clinic PRN. Jackquline Denmark, MD 09/02/2018 2:04:42 PM This report has been signed electronically.

## 2018-09-04 ENCOUNTER — Telehealth: Payer: Self-pay

## 2018-09-04 NOTE — Telephone Encounter (Signed)
Second follow up phone call attempt, no answer LM 

## 2018-09-04 NOTE — Telephone Encounter (Signed)
  Follow up Call-  Call back number 09/02/2018  Post procedure Call Back phone  # [phone]  Permission to leave phone message Yes     No ID on voicemail. No message left. Will try again midday.

## 2018-09-05 ENCOUNTER — Telehealth: Payer: Self-pay

## 2018-09-05 ENCOUNTER — Ambulatory Visit: Payer: Medicaid Other

## 2018-09-05 ENCOUNTER — Other Ambulatory Visit: Payer: Self-pay

## 2018-09-05 VITALS — BP 142/82 | HR 78

## 2018-09-05 DIAGNOSIS — R262 Difficulty in walking, not elsewhere classified: Secondary | ICD-10-CM

## 2018-09-05 DIAGNOSIS — R261 Paralytic gait: Secondary | ICD-10-CM

## 2018-09-05 DIAGNOSIS — R42 Dizziness and giddiness: Secondary | ICD-10-CM | POA: Diagnosis not present

## 2018-09-05 DIAGNOSIS — R29898 Other symptoms and signs involving the musculoskeletal system: Secondary | ICD-10-CM | POA: Diagnosis not present

## 2018-09-05 DIAGNOSIS — R2681 Unsteadiness on feet: Secondary | ICD-10-CM | POA: Diagnosis not present

## 2018-09-05 DIAGNOSIS — M6281 Muscle weakness (generalized): Secondary | ICD-10-CM | POA: Diagnosis not present

## 2018-09-05 NOTE — Telephone Encounter (Signed)
Left vm for patient that visit will be mychart video due to covid 19. Advise pt on vm.

## 2018-09-05 NOTE — Therapy (Signed)
[facility] [address]  [place], [place], [address] Phone: [phone]   Fax:  [phone]  Physical Therapy Treatment  Patient Details  Name: Kassy Mcenroe MRN: [id number] Date of Birth: [birth year] Referring Provider (PT): Kathe Becton, FNP   Encounter Date: 09/05/2018  PT End of Session - 09/05/18 1016    Visit Number  15    Number of Visits  16    Date for PT Re-Evaluation  09/12/18    Authorization Type  Medicaid    Authorization Time Period  08/08/18 - 09/18/18    Authorization - Visit Number  5    Authorization - Number of Visits  6    PT Start Time  1007    PT Stop Time  1055    PT Time Calculation (min)  48 min    Equipment Utilized During Treatment  Gait belt    Activity Tolerance  Patient tolerated treatment well;Patient limited by fatigue    Behavior During Therapy  [facility] for tasks assessed/performed       Past Medical History:  Diagnosis Date  . Anemia   . Arthritis    trigger finger in left hand  . Complication of anesthesia    per pt, hard to wake up!  . Elevated cholesterol   . Female bladder prolapse    per urologist, does not have prolaspe  . GERD (gastroesophageal reflux disease)   . Heart murmur    pt unsure.  . Hiatal hernia   . Hypertension   . IBS (irritable bowel syndrome)   . Multiple myeloma ([place]) 2005   had partial chemo  . Peripheral neuropathy   . SOB (shortness of breath) on exertion    uses an inhaler  . Stroke [facility]) 2017   paralysis left arm/uses a walker    Past Surgical History:  Procedure Laterality Date  . BONE BIOPSY  2005   in her back  . COLONOSCOPY     over 10 years x3  . ESOPHAGOGASTRODUODENOSCOPY     incomplete-over 10 years ago   . TEE WITHOUT CARDIOVERSION N/A 12/08/2016   Procedure: TRANSESOPHAGEAL ECHOCARDIOGRAM (TEE);  Surgeon: Sueanne Margarita, MD;  Location: [facility] ENDOSCOPY;  Service: Cardiovascular;  Laterality: N/A;  . TUBAL LIGATION    .  UPPER GASTROINTESTINAL ENDOSCOPY    . WISDOM TOOTH EXTRACTION      Vitals:   09/05/18 1015  BP: (!) 142/82  Pulse: 78  SpO2: 97%    Subjective Assessment - 09/05/18 1254    Subjective  pt. doing well today noting 3-way hip kicker with red TB getting easy.    Pertinent History  stroke, peripheral neuropathy, multiple myeloma, HTN, hiatal hernia, TEE with cardioversion    Diagnostic tests  none recent    Patient Stated Goals  work on leg strength    Currently in Pain?  No/denies    Pain Score  0-No pain    Multiple Pain Sites  No                       [facility] Adult PT Treatment/Exercise - 09/05/18 0001      Ambulation/Gait   Ambulation Distance (Feet)  135 Feet   3 trials of 45 ft each with chair rest breaks    Assistive device  None    Gait Pattern  Step-through pattern;Poor foot clearance - right;Poor foot clearance - left;Trunk flexed;Decreased step length - right;Decreased step length - left;Ataxic;Narrow  base of support;Decreased arm swing - right;Decreased arm swing - left;Trendelenburg    Ambulation Surface  Level;Indoor    Gait velocity  decreased    Gait Comments  Cues provided for upright posture and B arm swing       Knee/Hip Exercises: Stretches   Gastroc Stretch  Right;30 seconds;3 reps;Left    Gastroc Stretch Limitations  leaning into TM rail       Knee/Hip Exercises: Aerobic   Nustep  L4 x 6 min (UE/LEs)      Knee/Hip Exercises: Standing   Heel Raises  Right;Left;1 set;10 reps    Heel Raises Limitations  B concentric/R & L eccentric at counter top   x 10 reps each LE   Hip Abduction  Right;Left;Knee straight;10 reps    Abduction Limitations  green TB looped around ankles; at TM rail    Hip Extension  Right;Left;10 reps;Knee straight    Extension Limitations  green TB looped around ankles      Knee/Hip Exercises: Seated   Long Arc Quad  Right;Left;10 reps    Long Arc Quad Weight  2 lbs.    Long Arc Quad Limitations  3" hold                 PT Short Term Goals - 08/01/18 1028      PT SHORT TERM GOAL #1   Title  Patient to be independent with initial HEP.    Time  1    Period  Weeks    Status  Achieved    Target Date  05/28/18        PT Long Term Goals - 08/01/18 1029      PT LONG TERM GOAL #1   Title  Patient to be independent with advanced HEP.    Time  6    Period  Weeks    Status  Partially Met   met for current   Target Date  09/12/18      PT LONG TERM GOAL #2   Title  Patient to score B LE strength atleast 3+/5.     Time  6    Period  Weeks    Status  Achieved      PT LONG TERM GOAL #3   Title  Patient to improve TUG score by 10 sec in order to make progress towards decreased risk of falls.    Baseline  05/21/18 TUG 25.7 sec    Time  3    Period  Weeks    Status  Achieved   06/11/18 15.5 sec      PT LONG TERM GOAL #4   Title  Patient to improve 5xSTS score by 10 sec in order to make progress towards decreased risk of falls.    Baseline  05/21/18 5xSTS 28.9 sec with B UE support    Time  6    Period  Weeks    Status  Partially Met   08/01/18: 25.3 sec   Target Date  09/12/18      PT LONG TERM GOAL #5   Title  Patient to report tolerance of standing/walking for 15 min before pain or fatigue limit her.     Time  6    Period  Weeks    Status  Partially Met   10 min at this time   Target Date  09/12/18      PT LONG TERM GOAL #6   Title  Patient to demonstrate 0 deg of B ankle  AROM to decrease risk of falls.     Time  3    Period  Weeks    Status  Achieved      PT LONG TERM GOAL #7   Title  Patient to score <14 sec on TUG with LRAD to decrease risk of falls.     Time  6    Period  Weeks    Status  On-going   08/01/18: 18.9 sec with 4WW   Target Date  09/12/18      PT LONG TERM GOAL #8   Title  Patient to demonstrate 10 deg of B ankle AROM to decrease risk of falls.     Time  6    Period  Weeks    Status  Achieved      PT LONG TERM GOAL  #9   TITLE  Patient to  report <2/10 dizziness and mild sway with standing horizontal and vertical head turns while gaze fixed on target.     Time  6    Period  Weeks    Status  On-going   c/o trouble focusing with vertical & horizontal head turns with walker support and unsteadiness   Target Date  09/12/18            Plan - 09/05/18 1040    Clinical Impression Statement  Pt. doing well today.  Notes 3-way hip kicker with red TB getting easy now thus updated HEP with green looped TB as pt. able to demo good control of movement pattern with green TB.  Pt. reporting she has not spoken with AFO rep regarding AFO coverage.  Continued gait training to improve safety with trials of gait without AD today with close supervision provided from therapist.  Some cueing provided for increased LE clearance, upright posture, and B arm swing for improved gait stability.  Pt. with good carryover today following cueing.  Ended session with pt. noting LE fatigue however feels her LE strength has improved significantly since starting therapy.    Comorbidities  stroke, peripheral neuropathy, multiple myeloma, HTN, hiatal hernia, TEE with cardioversion    Rehab Potential  Good    PT Treatment/Interventions  ADLs/Self Care Home Management;Cryotherapy;Electrical Stimulation;Functional mobility training;Stair training;Gait training;DME Instruction;Moist Heat;Therapeutic activities;Therapeutic exercise;Balance training;Neuromuscular re-education;Patient/family education;Passive range of motion;Manual techniques;Dry needling;Energy conservation;Splinting;Taping;Orthotic Fit/Training;Vasopneumatic Device;Vestibular    PT Next Visit Plan  continue challenging standing balance & gait, work on gaze stabilization    Consulted and Agree with Plan of Care  Patient       Patient will benefit from skilled therapeutic intervention in order to improve the following deficits and impairments:  Abnormal gait, Decreased endurance, Decreased activity  tolerance, Decreased strength, Pain, Decreased balance, Decreased mobility, Difficulty walking, Increased muscle spasms, Improper body mechanics, Decreased range of motion, Decreased safety awareness, Impaired flexibility, Postural dysfunction, Dizziness  Visit Diagnosis: 1. Paralytic gait   2. Difficulty in walking, not elsewhere classified   3. Muscle weakness (generalized)   4. Unsteadiness on feet   5. Dizziness and giddiness   6. Other symptoms and signs involving the musculoskeletal system        Problem List Patient Active Problem List   Diagnosis Date Noted  . B12 deficiency 12/19/2016  . Chest pain 12/18/2016  . Cerebellar infarct ([place])   . Diastolic dysfunction   . Marijuana abuse   . History of tobacco abuse   . Benign essential HTN   . Tachycardia   . Hypokalemia   . Stroke (cerebrum) ([place]) 12/05/2016  .  Nonintractable headache 12/05/2016  . Occipital stroke ([place]) 12/05/2016  . Hypertensive urgency 12/05/2016  . Hypertension     Bess Harvest, [place] 09/05/18 12:55 PM   [facility] [address]  [place], [place], [address] Phone: [phone]   Fax:  [phone]  Name: Kaycie Pegues MRN: [id number] Date of Birth: Jun 19, 1965

## 2018-09-06 ENCOUNTER — Other Ambulatory Visit: Payer: Self-pay

## 2018-09-06 ENCOUNTER — Encounter: Payer: Self-pay | Admitting: Physical Medicine & Rehabilitation

## 2018-09-06 ENCOUNTER — Encounter: Payer: Medicaid Other | Attending: Physical Medicine & Rehabilitation | Admitting: Physical Medicine & Rehabilitation

## 2018-09-06 VITALS — BP 137/83 | HR 88 | Temp 98.1°F | Ht 61.0 in | Wt 173.4 lb

## 2018-09-06 DIAGNOSIS — R208 Other disturbances of skin sensation: Secondary | ICD-10-CM

## 2018-09-06 DIAGNOSIS — M79604 Pain in right leg: Secondary | ICD-10-CM

## 2018-09-06 DIAGNOSIS — I6930 Unspecified sequelae of cerebral infarction: Secondary | ICD-10-CM

## 2018-09-06 DIAGNOSIS — E559 Vitamin D deficiency, unspecified: Secondary | ICD-10-CM | POA: Diagnosis present

## 2018-09-06 DIAGNOSIS — M79605 Pain in left leg: Secondary | ICD-10-CM | POA: Diagnosis not present

## 2018-09-06 DIAGNOSIS — G479 Sleep disorder, unspecified: Secondary | ICD-10-CM | POA: Diagnosis not present

## 2018-09-06 DIAGNOSIS — M791 Myalgia, unspecified site: Secondary | ICD-10-CM | POA: Diagnosis not present

## 2018-09-06 DIAGNOSIS — R269 Unspecified abnormalities of gait and mobility: Secondary | ICD-10-CM | POA: Diagnosis not present

## 2018-09-06 DIAGNOSIS — R600 Localized edema: Secondary | ICD-10-CM

## 2018-09-06 MED ORDER — AMITRIPTYLINE HCL 10 MG PO TABS: 10.0000 mg | ORAL_TABLET | Freq: Every day | ORAL | 1 refills | Status: DC

## 2018-09-06 MED ORDER — DULOXETINE HCL 30 MG PO CPEP: 30.0000 mg | ORAL_CAPSULE | Freq: Every day | ORAL | 1 refills | Status: DC

## 2018-09-06 NOTE — Progress Notes (Addendum)
Subjective:    Patient ID: Brittany [name], female    DOB: 06/22/1965, 53 y.o.   MRN: [id number]  HPI  Female with past medical history of CVA in 2017 with residual left-sided weakness, peripheral neuropathy, multiple myeloma, IBS, HTN, GERD, OA presents with b/l leg pain.  Started after stroke. Progressively getting worse.  Tingling, pins/needles.  Associated numbness and weakness.  Heat and stimulation improve the pain.  Prolonged activities exacerbate the pain.  She has spasms in calves, rest improves that.  Constant.  She is in therapies, which is helping.  Lyrica is helping. Denies falls recently, uses rollator.  Pain limits ambulation.   Pain Inventory Average Pain 8 Pain Right Now 5 My pain is burning, dull, stabbing, tingling and aching  In the last 24 hours, has pain interfered with the following? General activity 4 Relation with others 4 Enjoyment of life 4 What TIME of day is your pain at its worst? daytime, evening and night Sleep (in general) Fair  Pain is worse with: walking, sitting, standing and some activites Pain improves with: medication Relief from Meds: 7  Mobility use a walker how many minutes can you walk? 10 ability to climb steps?  no do you drive?  yes  Function disabled: date disabled 2018  Neuro/Psych bladder control problems weakness numbness tremor trouble walking  Prior Studies bone scan  Physicians involved in your care Primary care Dr. Harlow Ohms Neurologist Dr. Leonie Man   Family History  Problem Relation Age of Onset  . Hypertension Mother   . Sarcoidosis Mother        currently in remission   . Diverticulitis Mother   . Irritable bowel syndrome Mother   . Liver cancer Mother   . Hypertension Father   . Stomach cancer Father   . Congestive Heart Failure Father   . Stroke Maternal Uncle   . Scoliosis Brother   . Colon cancer Neg Hx   . Esophageal cancer Neg Hx   . Colon polyps Neg Hx   . Rectal cancer Neg Hx     Social History   Socioeconomic History  . Marital status: Legally Separated    Spouse name: Not on file  . Number of children: Not on file  . Years of education: Not on file  . Highest education level: Not on file  Occupational History  . Occupation: Disabled  Social Needs  . Financial resource strain: Not on file  . Food insecurity    Worry: Not on file    Inability: Not on file  . Transportation needs    Medical: Not on file    Non-medical: Not on file  Tobacco Use  . Smoking status: Former Smoker    Quit date: 12/06/2015    Years since quitting: 2.7  . Smokeless tobacco: Never Used  Substance and Sexual Activity  . Alcohol use: Not Currently  . Drug use: Not Currently    Types: Marijuana    Comment: in the past   . Sexual activity: Not on file  Lifestyle  . Physical activity    Days per week: Not on file    Minutes per session: Not on file  . Stress: Not on file  Relationships  . Social Herbalist on phone: Not on file    Gets together: Not on file    Attends religious service: Not on file    Active member of club or organization: Not on file    Attends meetings of clubs  or organizations: Not on file    Relationship status: Not on file  Other Topics Concern  . Not on file  Social History Narrative  . Not on file   Past Surgical History:  Procedure Laterality Date  . BONE BIOPSY  2005   in her back  . COLONOSCOPY     over 10 years x3  . ESOPHAGOGASTRODUODENOSCOPY     incomplete-over 10 years ago   . TEE WITHOUT CARDIOVERSION N/A 12/08/2016   Procedure: TRANSESOPHAGEAL ECHOCARDIOGRAM (TEE);  Surgeon: Sueanne Margarita, MD;  Location: [facility] ENDOSCOPY;  Service: Cardiovascular;  Laterality: N/A;  . TUBAL LIGATION    . UPPER GASTROINTESTINAL ENDOSCOPY    . WISDOM TOOTH EXTRACTION     Past Medical History:  Diagnosis Date  . Anemia   . Arthritis    trigger finger in left hand  . Complication of anesthesia    per pt, hard to wake up!  . Elevated  cholesterol   . Female bladder prolapse    per urologist, does not have prolaspe  . GERD (gastroesophageal reflux disease)   . Heart murmur    pt unsure.  . Hiatal hernia   . Hypertension   . IBS (irritable bowel syndrome)   . Multiple myeloma ([place]) 2005   had partial chemo  . Peripheral neuropathy   . SOB (shortness of breath) on exertion    uses an inhaler  . Stroke [facility]) 2017   paralysis left arm/uses a walker   BP 137/83   Pulse 88   Temp 98.1 F (36.7 C)   Ht 5' 1"  (1.549 m)   Wt 173 lb 6.4 oz (78.7 kg)   SpO2 98%   BMI 32.76 kg/m   Opioid Risk Score:   Fall Risk Score:  `1  Depression screen PHQ 2/9  Depression screen [facility] 2/9 09/06/2018 12/31/2017 09/28/2017 08/21/2017 05/21/2017 02/20/2017 01/15/2017  Decreased Interest 0 0 0 0 0 0 0  Down, Depressed, Hopeless 0 0 0 0 0 0 0  PHQ - 2 Score 0 0 0 0 0 0 0     Review of Systems  Constitutional: Positive for diaphoresis.  HENT: Negative.   Eyes: Negative.  Negative for visual disturbance.  Respiratory: Negative.   Cardiovascular: Positive for leg swelling.  Gastrointestinal: Positive for constipation.  Endocrine: Negative.   Genitourinary: Negative.   Musculoskeletal: Negative.        Spasms Arm, hand and leg pain   Skin: Negative.   Allergic/Immunologic: Negative.   Neurological: Positive for tremors, weakness and numbness.  Hematological: Negative.   Psychiatric/Behavioral: Negative.   All other systems reviewed and are negative.      Objective:   Physical Exam Gen: NAD. Vital signs reviewed HENT: Normocephalic, Atraumatic Eyes: EOMI. No discharge.  Cardio: No JVD. Pulm: Effort normal Abd: Nondistended MSK:  Gait: slow cadence.   No TTP, including back  No edema.  Neuro: CN II-XII grossly intact.    Sensation intact to light touch in all LE dermatomes  Strength  4-/5 in all LE myotomes Skin: Warm and Dry. Intact    Assessment & Plan:  Female with past medical history of CVA in 2017 with  residual left-sided weakness, peripheral neuropathy, multiple myeloma, IBS, HTN, GERD, OA presents with b/l leg pain.    1. Chronic lower extremity pain  CT head reviewed, stable, MRI left PICA infarct  Labs reviewed  She states xrays of her back were ordered  No benefit with Gabapentin  Referral information reviewed  NCS/EMG 02/2018 reviewed - LE essentially normal  Trial Heat  Cont PT, avoid TENS due to hx of CA  Will consider Voltaren gel/Lidoderm  Will order Cymbalta 71m daily with food, educated on signs/symptoms of seratonin syndrome  Cont Lyrica  Cont Robaxin, encouraged qhs.  Also discussed reducing dose to 250 and evaluation for fatigue  Will consider Mobic  Will consider referral to Psychology  Patient states main goal is to ambulate   2. Gait abnormality  Cont rollator  3. Sleep disturbance  See #1  Will order Elavil 10 qhs  4. Obesity  Will consider referral to dietitian  5. Myalgia   Will consider trigger point injections  6. Vit D Deficiency  Cont supplement  Will reorder  7. LE edema  ?history of diastolic dysfunction. Echo reviewed - relatively unremarkable  Follow up with PCP.

## 2018-09-07 LAB — VITAMIN D 25 HYDROXY (VIT D DEFICIENCY, FRACTURES): Vit D, 25-Hydroxy: 26 ng/mL — ABNORMAL LOW (ref 30.0–100.0)

## 2018-09-09 ENCOUNTER — Telehealth: Payer: Self-pay

## 2018-09-09 ENCOUNTER — Ambulatory Visit: Payer: Medicaid Other | Admitting: Physical Therapy

## 2018-09-09 ENCOUNTER — Other Ambulatory Visit: Payer: Self-pay

## 2018-09-09 ENCOUNTER — Encounter: Payer: Self-pay | Admitting: Physical Therapy

## 2018-09-09 VITALS — BP 120/65 | HR 92

## 2018-09-09 DIAGNOSIS — R261 Paralytic gait: Secondary | ICD-10-CM

## 2018-09-09 DIAGNOSIS — R2681 Unsteadiness on feet: Secondary | ICD-10-CM

## 2018-09-09 DIAGNOSIS — M6281 Muscle weakness (generalized): Secondary | ICD-10-CM | POA: Diagnosis not present

## 2018-09-09 DIAGNOSIS — R29898 Other symptoms and signs involving the musculoskeletal system: Secondary | ICD-10-CM

## 2018-09-09 DIAGNOSIS — R262 Difficulty in walking, not elsewhere classified: Secondary | ICD-10-CM | POA: Diagnosis not present

## 2018-09-09 DIAGNOSIS — R42 Dizziness and giddiness: Secondary | ICD-10-CM | POA: Diagnosis not present

## 2018-09-09 NOTE — Therapy (Signed)
[facility] [address]  [place], [place], [address] Phone: [phone]   Fax:  [phone]  Physical Therapy Treatment  Patient Details  Name: Brittany [name] MRN: [id number] Date of Birth: [birth year] Referring Provider (PT): Kathe Becton, FNP   Encounter Date: 09/09/2018  PT End of Session - 09/09/18 1715    Visit Number  16    Number of Visits  22    Date for PT Re-Evaluation  10/21/18    Authorization Type  Medicaid    Authorization Time Period  08/08/18 - 09/18/18    Authorization - Visit Number  6    Authorization - Number of Visits  6    PT Start Time  1500    PT Stop Time  1546    PT Time Calculation (min)  46 min    Equipment Utilized During Treatment  Gait belt    Activity Tolerance  Patient tolerated treatment well;Patient limited by fatigue    Behavior During Therapy  [facility] for tasks assessed/performed       Past Medical History:  Diagnosis Date  . Anemia   . Arthritis    trigger finger in left hand  . Complication of anesthesia    per pt, hard to wake up!  . Elevated cholesterol   . Female bladder prolapse    per urologist, does not have prolaspe  . GERD (gastroesophageal reflux disease)   . Heart murmur    pt unsure.  . Hiatal hernia   . Hypertension   . IBS (irritable bowel syndrome)   . Multiple myeloma ([place]) 2005   had partial chemo  . Peripheral neuropathy   . SOB (shortness of breath) on exertion    uses an inhaler  . Stroke [facility]) 2017   paralysis left arm/uses a walker    Past Surgical History:  Procedure Laterality Date  . BONE BIOPSY  2005   in her back  . COLONOSCOPY     over 10 years x3  . ESOPHAGOGASTRODUODENOSCOPY     incomplete-over 10 years ago   . TEE WITHOUT CARDIOVERSION N/A 12/08/2016   Procedure: TRANSESOPHAGEAL ECHOCARDIOGRAM (TEE);  Surgeon: Sueanne Margarita, MD;  Location: [facility] ENDOSCOPY;  Service: Cardiovascular;  Laterality: N/A;  . TUBAL LIGATION    .  UPPER GASTROINTESTINAL ENDOSCOPY    . WISDOM TOOTH EXTRACTION      Vitals:   09/09/18 1500  BP: 120/65  Pulse: 92  SpO2: 93%    Subjective Assessment - 09/09/18 1503    Subjective  Feels okay today. Reports that she is still having trouble with leg swelling. Feels she has done well with therapy but would like to continue working on her balance. Reports 80% improvement.    Pertinent History  stroke, peripheral neuropathy, multiple myeloma, HTN, hiatal hernia, TEE with cardioversion    Diagnostic tests  none recent    Patient Stated Goals  work on leg strength    Currently in Pain?  Yes    Pain Score  5     Pain Location  Leg    Pain Orientation  Right;Left    Pain Descriptors / Indicators  Aching    Pain Type  Chronic pain    Pain Score  5    Pain Location  Arm    Pain Orientation  Left    Pain Descriptors / Indicators  Burning    Pain Type  Chronic pain  [facility] PT Assessment - 09/09/18 0001      Assessment   Medical Diagnosis  CVA, B LE pain    Referring Provider (PT)  Kathe Becton, FNP    Onset Date/Surgical Date  12/18/16      Strength   Right Hip Flexion  4+/5    Right Hip ABduction  4/5    Right Hip ADduction  4/5    Left Hip Flexion  4+/5    Left Hip ABduction  4/5    Left Hip ADduction  4/5    Right Knee Flexion  4/5    Right Knee Extension  4/5    Left Knee Flexion  4-/5    Left Knee Extension  4/5    Right Ankle Dorsiflexion  4-/5    Right Ankle Plantar Flexion  3+/5    Left Ankle Dorsiflexion  3+/5    Left Ankle Plantar Flexion  3+/5      Standardized Balance Assessment   Standardized Balance Assessment  Five Times Sit to Stand;Timed Up and Go Test    Five times sit to stand comments   18.62 w/ B UEs on armrests      Timed Up and Go Test   Normal TUG (seconds)  18.87         Vestibular Assessment - 09/09/18 0001      Positional Testing   Dix-Hallpike  Dix-Hallpike Right;Dix-Hallpike Left      Dix-Hallpike Right   Dix-Hallpike  Right Duration  ~15 sec    Dix-Hallpike Right Symptoms  No nystagmus      Dix-Hallpike Left   Dix-Hallpike Left Duration  ~15 sec    Dix-Hallpike Left Symptoms  No nystagmus               [facility] Adult PT Treatment/Exercise - 09/09/18 0001      Knee/Hip Exercises: Aerobic   Nustep  L5 x 6 min (UE/LEs)      Vestibular Treatment/Exercise - 09/09/18 0001      Vestibular Treatment/Exercise   Vestibular Treatment Provided  Habituation    Habituation Exercises  Comment   sitting R & L anterior habituation 10" each           PT Education - 09/09/18 1715    Education Details  update to HEP; edu on objective progress thus far with PT    Person(s) Educated  Patient    Methods  Explanation;Demonstration;Tactile cues;Verbal cues;Handout    Comprehension  Verbalized understanding;Returned demonstration       PT Short Term Goals - 09/09/18 1509      PT SHORT TERM GOAL #1   Title  Patient to be independent with initial HEP.    Time  1    Period  Weeks    Status  Achieved    Target Date  05/28/18        PT Long Term Goals - 09/09/18 1508      PT LONG TERM GOAL #1   Title  Patient to be independent with advanced HEP.    Time  6    Period  Weeks    Status  Partially Met   met for current   Target Date  10/21/18      PT LONG TERM GOAL #2   Title  Patient to score B LE strength atleast 3+/5.     Time  6    Period  Weeks    Status  Achieved      PT LONG TERM GOAL #3  Title  Patient to improve TUG score by 10 sec in order to make progress towards decreased risk of falls.    Baseline  05/21/18 TUG 25.7 sec    Time  3    Period  Weeks    Status  Achieved   06/11/18 15.5 sec      PT LONG TERM GOAL #4   Title  Patient to improve 5xSTS score by 10 sec in order to make progress towards decreased risk of falls.    Baseline  05/21/18 5xSTS 28.9 sec with B UE support    Time  6    Period  Weeks    Status  Achieved   09/09/18: 18.62 sec     PT LONG TERM GOAL #5    Title  Patient to report tolerance of standing/walking for 15 min before pain or fatigue limit her.     Time  6    Period  Weeks    Status  Partially Met   10 min at this time   Target Date  10/21/18      Additional Long Term Goals   Additional Long Term Goals  Yes      PT LONG TERM GOAL #6   Title  Patient to demonstrate 0 deg of B ankle AROM to decrease risk of falls.     Time  3    Period  Weeks    Status  Achieved      PT LONG TERM GOAL #7   Title  Patient to score <14 sec on TUG with LRAD to decrease risk of falls.     Time  6    Period  Weeks    Status  On-going   09/09/18: 18.87 sec with 4WW   Target Date  10/21/18      PT LONG TERM GOAL #8   Title  Patient to demonstrate 10 deg of B ankle AROM to decrease risk of falls.     Time  6    Period  Weeks    Status  Achieved      PT LONG TERM GOAL  #9   TITLE  Patient to report <2/10 dizziness and mild sway with standing horizontal and vertical head turns while gaze fixed on target.     Time  6    Period  Weeks    Status  Partially Met   report of 2-3/10 dizziness with horizontal head turns with walker support; no dizziness with vertical head turns   Target Date  10/21/18      PT LONG TERM GOAL  #10   TITLE  Patient to score atleast 4/5 in B LE strength.    Time  6    Period  Weeks    Status  New    Target Date  10/21/18            Plan - 09/09/18 1725    Clinical Impression Statement  Patient arrived to session with report of 80% improvement since initial eval. Notes that she has made big improvements with therapy, but continues to be dizzy when riding the elevator, bending forward, and rolling L. Also would like to work on her balance and use of AFO once she receives it. Patient reports that she is still limited to 10 minutes of standing or walking before the onset of fatigue or pain in her legs. Updated goals with additional LE strength goal at patient has met her recent goal, but still limited in  functional activities.  Patient scored 18.87 sec on TUG testing, indicating increased risk of falls. Patient also reporting 2-3/10 dizziness with standing horizontal VOR and no dizziness with vertical VOR, which is improved from last progress note. Patient has met 5xSTS goal, now scoring 18. 62 sec compared to 28.9 sec when first measured. D/t patient's subjective report, testing R & L Dix Hallpike- both symptomatic but negative for nystagmus. Patient also reporting dizziness with sitting anterior habituation exercises. Updated HEP with this exercise and instructed patient to perform when someone else is home with her for safety. Patient reported understanding. Patient showing good progress with PT, would benefit from additional skilled PT services 1x/week for 6 weeks to address remaining goals.    PT Frequency  1x / week    PT Duration  6 weeks    PT Treatment/Interventions  ADLs/Self Care Home Management;Cryotherapy;Electrical Stimulation;Functional mobility training;Stair training;Gait training;DME Instruction;Moist Heat;Therapeutic activities;Therapeutic exercise;Balance training;Neuromuscular re-education;Patient/family education;Passive range of motion;Manual techniques;Dry needling;Energy conservation;Splinting;Taping;Orthotic Fit/Training;Vasopneumatic Device;Vestibular    PT Next Visit Plan  continue challenging standing balance & gait, work on gaze stabilization and habituation    Consulted and Agree with Plan of Care  Patient       Patient will benefit from skilled therapeutic intervention in order to improve the following deficits and impairments:  Abnormal gait, Decreased endurance, Decreased activity tolerance, Decreased strength, Pain, Decreased balance, Decreased mobility, Difficulty walking, Increased muscle spasms, Improper body mechanics, Decreased range of motion, Decreased safety awareness, Impaired flexibility, Postural dysfunction, Dizziness  Visit Diagnosis: 1. Paralytic gait   2.  Difficulty in walking, not elsewhere classified   3. Muscle weakness (generalized)   4. Unsteadiness on feet   5. Dizziness and giddiness   6. Other symptoms and signs involving the musculoskeletal system        Problem List Patient Active Problem List   Diagnosis Date Noted  . 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 B12 deficiency 12/19/2016  . Chest pain 12/18/2016  . Cerebellar infarct ([place])   . Diastolic dysfunction   . Marijuana abuse   . History of tobacco abuse   . Benign essential HTN   . Tachycardia   . Hypokalemia   . Stroke (cerebrum) ([place]) 12/05/2016  . Nonintractable headache 12/05/2016  . Occipital stroke ([place]) 12/05/2016  . Hypertensive urgency 12/05/2016  . Hypertension     Janene Harvey, PT, DPT 09/09/18 5:29 PM   [facility] [address]  [place], [place], [address] Phone: [phone]   Fax:  [phone]  Name: Brittany [name] MRN: [id number] Date of Birth: 1965/12/27

## 2018-09-09 NOTE — Telephone Encounter (Signed)
LEft 2nd vm for pt that her visit will be change to mychart due to COVID 19. LEft vm for her to call back.

## 2018-09-11 ENCOUNTER — Ambulatory Visit: Payer: Medicaid Other | Admitting: Adult Health

## 2018-09-11 NOTE — Telephone Encounter (Signed)
Medicaid Josem Kaufmann: [id number] (exp. 08/19/18 to 02/15/19) patient is scheduled at [facility] on 09/19/18.

## 2018-09-12 ENCOUNTER — Ambulatory Visit: Payer: Medicaid Other

## 2018-09-16 ENCOUNTER — Ambulatory Visit: Payer: Medicaid Other

## 2018-09-18 ENCOUNTER — Encounter: Payer: Self-pay | Admitting: Gastroenterology

## 2018-09-19 ENCOUNTER — Other Ambulatory Visit: Payer: Medicaid Other

## 2018-09-20 ENCOUNTER — Ambulatory Visit: Payer: Medicaid Other | Admitting: Physical Therapy

## 2018-09-24 ENCOUNTER — Other Ambulatory Visit: Payer: Self-pay

## 2018-09-24 ENCOUNTER — Encounter: Payer: Self-pay | Admitting: Physical Therapy

## 2018-09-24 ENCOUNTER — Ambulatory Visit: Payer: Medicaid Other | Attending: Family Medicine | Admitting: Physical Therapy

## 2018-09-24 VITALS — BP 135/80 | HR 78

## 2018-09-24 DIAGNOSIS — R42 Dizziness and giddiness: Secondary | ICD-10-CM | POA: Diagnosis present

## 2018-09-24 DIAGNOSIS — R262 Difficulty in walking, not elsewhere classified: Secondary | ICD-10-CM | POA: Insufficient documentation

## 2018-09-24 DIAGNOSIS — M6281 Muscle weakness (generalized): Secondary | ICD-10-CM | POA: Diagnosis present

## 2018-09-24 DIAGNOSIS — R2681 Unsteadiness on feet: Secondary | ICD-10-CM | POA: Diagnosis present

## 2018-09-24 DIAGNOSIS — R261 Paralytic gait: Secondary | ICD-10-CM | POA: Diagnosis present

## 2018-09-24 DIAGNOSIS — R29898 Other symptoms and signs involving the musculoskeletal system: Secondary | ICD-10-CM | POA: Insufficient documentation

## 2018-09-24 NOTE — Therapy (Signed)
[facility] [address]  [place], [place], [address] Phone: [phone]   Fax:  [phone]  Physical Therapy Treatment  Patient Details  Name: Brittany [name] MRN: [id number] Date of Birth: [birth year] Referring Provider (PT): Kathe Becton, FNP   Encounter Date: 09/24/2018  PT End of Session - 09/24/18 1200    Visit Number  17    Number of Visits  22    Date for PT Re-Evaluation  10/21/18    Authorization Type  Medicaid    Authorization Time Period  07/02 - 08/12    Authorization - Visit Number  1    Authorization - Number of Visits  6    PT Start Time  1026   pt late   PT Stop Time  1104    PT Time Calculation (min)  38 min    Equipment Utilized During Treatment  --    Activity Tolerance  Patient tolerated treatment well    Behavior During Therapy  [facility] for tasks assessed/performed       Past Medical History:  Diagnosis Date  . Anemia   . Arthritis    trigger finger in left hand  . Complication of anesthesia    per pt, hard to wake up!  . Elevated cholesterol   . Female bladder prolapse    per urologist, does not have prolaspe  . GERD (gastroesophageal reflux disease)   . Heart murmur    pt unsure.  . Hiatal hernia   . Hypertension   . IBS (irritable bowel syndrome)   . Multiple myeloma ([place]) 2005   had partial chemo  . Peripheral neuropathy   . SOB (shortness of breath) on exertion    uses an inhaler  . Stroke [facility]) 2017   paralysis left arm/uses a walker    Past Surgical History:  Procedure Laterality Date  . BONE BIOPSY  2005   in her back  . COLONOSCOPY     over 10 years x3  . ESOPHAGOGASTRODUODENOSCOPY     incomplete-over 10 years ago   . TEE WITHOUT CARDIOVERSION N/A 12/08/2016   Procedure: TRANSESOPHAGEAL ECHOCARDIOGRAM (TEE);  Surgeon: Sueanne Margarita, MD;  Location: [facility] ENDOSCOPY;  Service: Cardiovascular;  Laterality: N/A;  . TUBAL LIGATION    . UPPER GASTROINTESTINAL  ENDOSCOPY    . WISDOM TOOTH EXTRACTION      Vitals:   09/24/18 1031  BP: 135/80  Pulse: 78  SpO2: 95%    Subjective Assessment - 09/24/18 1028    Subjective  Reports that she was put on a new medicine and her throat closed up. Was unable to eat for 2 days. Has not spoken with MD. Had an early dentist appointment and has not taken her meds this AM.    Pertinent History  stroke, peripheral neuropathy, multiple myeloma, HTN, hiatal hernia, TEE with cardioversion    Diagnostic tests  none recent    Patient Stated Goals  work on leg strength    Currently in Pain?  No/denies                       [facility] Adult PT Treatment/Exercise - 09/24/18 0001      Knee/Hip Exercises: Aerobic   Nustep  L4 x 6 min (UE/LEs)      Knee/Hip Exercises: Seated   Other Seated Knee/Hip Exercises  R & L 4 way ankle with red TB x15       Vestibular Treatment/Exercise -  09/24/18 0001      Vestibular Treatment/Exercise   Vestibular Treatment Provided  Habituation    Habituation Exercises  Nestor Lewandowsky   anterior habituation 3x each side- 5/10 dizziness     Nestor Lewandowsky   Number of Reps   5    Symptom Description   5x each side with min A to assist with sit up and speed   5/10 dizziness; worse on R           PT Education - 09/24/18 1159    Education Details  update to HEP    Person(s) Educated  Patient    Methods  Explanation;Demonstration;Tactile cues;Verbal cues;Handout    Comprehension  Verbalized understanding;Returned demonstration       PT Short Term Goals - 09/09/18 1509      PT SHORT TERM GOAL #1   Title  Patient to be independent with initial HEP.    Time  1    Period  Weeks    Status  Achieved    Target Date  05/28/18        PT Long Term Goals - 09/09/18 1508      PT LONG TERM GOAL #1   Title  Patient to be independent with advanced HEP.    Time  6    Period  Weeks    Status  Partially Met   met for current   Target Date  10/21/18      PT LONG  TERM GOAL #2   Title  Patient to score B LE strength atleast 3+/5.     Time  6    Period  Weeks    Status  Achieved      PT LONG TERM GOAL #3   Title  Patient to improve TUG score by 10 sec in order to make progress towards decreased risk of falls.    Baseline  05/21/18 TUG 25.7 sec    Time  3    Period  Weeks    Status  Achieved   06/11/18 15.5 sec      PT LONG TERM GOAL #4   Title  Patient to improve 5xSTS score by 10 sec in order to make progress towards decreased risk of falls.    Baseline  05/21/18 5xSTS 28.9 sec with B UE support    Time  6    Period  Weeks    Status  Achieved   09/09/18: 18.62 sec     PT LONG TERM GOAL #5   Title  Patient to report tolerance of standing/walking for 15 min before pain or fatigue limit her.     Time  6    Period  Weeks    Status  Partially Met   10 min at this time   Target Date  10/21/18      Additional Long Term Goals   Additional Long Term Goals  Yes      PT LONG TERM GOAL #6   Title  Patient to demonstrate 0 deg of B ankle AROM to decrease risk of falls.     Time  3    Period  Weeks    Status  Achieved      PT LONG TERM GOAL #7   Title  Patient to score <14 sec on TUG with LRAD to decrease risk of falls.     Time  6    Period  Weeks    Status  On-going   09/09/18: 18.87 sec with 6VE  Target Date  10/21/18      PT LONG TERM GOAL #8   Title  Patient to demonstrate 10 deg of B ankle AROM to decrease risk of falls.     Time  6    Period  Weeks    Status  Achieved      PT LONG TERM GOAL  #9   TITLE  Patient to report <2/10 dizziness and mild sway with standing horizontal and vertical head turns while gaze fixed on target.     Time  6    Period  Weeks    Status  Partially Met   report of 2-3/10 dizziness with horizontal head turns with walker support; no dizziness with vertical head turns   Target Date  10/21/18      PT LONG TERM GOAL  #10   TITLE  Patient to score atleast 4/5 in B LE strength.    Time  6    Period   Weeks    Status  New    Target Date  10/21/18            Plan - 09/24/18 1201    Clinical Impression Statement  Patient arrived to session with report of allergic reaction to new medicine which has since resolved. Advised patient to F/U with MD. Reviewed anterior habituation exercises for improved carryover. Patient demonstrating good form with this exercise; still reporting 5/10 dizziness to each direction. Introduced [employer] with min A for supine>sit motion for increased speed. Patient more symptomatic to L side than R. Updated HEP with this exercise to be performed at home- patient reported understanding. Reviewed 4 way ankle with cues to decrease speed as patient with decreased control with eccentric phase with inversion and eversion. Ended session with no complaints.    Comorbidities  stroke, peripheral neuropathy, multiple myeloma, HTN, hiatal hernia, TEE with cardioversion    PT Treatment/Interventions  ADLs/Self Care Home Management;Cryotherapy;Electrical Stimulation;Functional mobility training;Stair training;Gait training;DME Instruction;Moist Heat;Therapeutic activities;Therapeutic exercise;Balance training;Neuromuscular re-education;Patient/family education;Passive range of motion;Manual techniques;Dry needling;Energy conservation;Splinting;Taping;Orthotic Fit/Training;Vasopneumatic Device;Vestibular    PT Next Visit Plan  continue challenging standing balance & gait, work on gaze stabilization and habituation    Consulted and Agree with Plan of Care  Patient       Patient will benefit from skilled therapeutic intervention in order to improve the following deficits and impairments:  Abnormal gait, Decreased endurance, Decreased activity tolerance, Decreased strength, Pain, Decreased balance, Decreased mobility, Difficulty walking, Increased muscle spasms, Improper body mechanics, Decreased range of motion, Decreased safety awareness, Impaired flexibility, Postural dysfunction,  Dizziness  Visit Diagnosis: 1. Paralytic gait   2. Difficulty in walking, not elsewhere classified   3. Muscle weakness (generalized)   4. Unsteadiness on feet   5. Dizziness and giddiness   6. Other symptoms and signs involving the musculoskeletal system        Problem List Patient Active Problem List   Diagnosis Date Noted  . 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 B12 deficiency 12/19/2016  . Chest pain 12/18/2016  . Cerebellar infarct ([place])   . Diastolic dysfunction   . Marijuana abuse   . History of tobacco abuse   . Benign essential HTN   . Tachycardia   . Hypokalemia   . Stroke (cerebrum) ([place]) 12/05/2016  . Nonintractable headache 12/05/2016  . Occipital stroke ([place]) 12/05/2016  . Hypertensive urgency 12/05/2016  . Hypertension     Janene Harvey, PT, DPT 09/24/18 12:06 PM   [facility] [address]  [place], [place], [address] Phone: [phone]   Fax:  [phone]  Name: Brittany [name] MRN: [id number] Date of Birth: 1965/07/21

## 2018-09-28 ENCOUNTER — Emergency Department (HOSPITAL_BASED_OUTPATIENT_CLINIC_OR_DEPARTMENT_OTHER)
Admission: EM | Admit: 2018-09-28 | Discharge: 2018-09-28 | Disposition: A | Discharge status: Home / Self Care | Payer: Medicaid Other | Attending: Emergency Medicine | Admitting: Emergency Medicine

## 2018-09-28 ENCOUNTER — Other Ambulatory Visit: Payer: Self-pay

## 2018-09-28 ENCOUNTER — Encounter (HOSPITAL_BASED_OUTPATIENT_CLINIC_OR_DEPARTMENT_OTHER): Payer: Self-pay | Admitting: *Deleted

## 2018-09-28 DIAGNOSIS — I10 Essential (primary) hypertension: Secondary | ICD-10-CM | POA: Insufficient documentation

## 2018-09-28 DIAGNOSIS — Z7982 Long term (current) use of aspirin: Secondary | ICD-10-CM | POA: Insufficient documentation

## 2018-09-28 DIAGNOSIS — Z91040 Latex allergy status: Secondary | ICD-10-CM | POA: Diagnosis not present

## 2018-09-28 DIAGNOSIS — Z87891 Personal history of nicotine dependence: Secondary | ICD-10-CM | POA: Diagnosis not present

## 2018-09-28 DIAGNOSIS — I69359 Hemiplegia and hemiparesis following cerebral infarction affecting unspecified side: Secondary | ICD-10-CM | POA: Insufficient documentation

## 2018-09-28 DIAGNOSIS — Z79899 Other long term (current) drug therapy: Secondary | ICD-10-CM | POA: Diagnosis not present

## 2018-09-28 DIAGNOSIS — B370 Candidal stomatitis: Secondary | ICD-10-CM | POA: Insufficient documentation

## 2018-09-28 DIAGNOSIS — J029 Acute pharyngitis, unspecified: Secondary | ICD-10-CM | POA: Diagnosis present

## 2018-09-28 LAB — CBG MONITORING, ED: Glucose-Capillary: 95 mg/dL (ref 70–99)

## 2018-09-28 LAB — GROUP A STREP BY PCR: Group A Strep by PCR: NOT DETECTED

## 2018-09-28 MED ORDER — FLUCONAZOLE 200 MG PO TABS: 200.0000 mg | ORAL_TABLET | Freq: Every day | ORAL | 0 refills | Status: DC

## 2018-09-28 NOTE — ED Triage Notes (Signed)
Sore throat x 1 week. Throat is "on fire". She saw her dentist and has been taking medicine for thrush. Pt ambulatory to room 1 using her own walker

## 2018-09-28 NOTE — ED Provider Notes (Signed)
[facility] EMERGENCY DEPARTMENT Provider Note   CSN: [id number] Arrival date & time: 09/28/18  1321     History   Chief Complaint Chief Complaint  Patient presents with  . Sore Throat    HPI Brittany [name] is a 53 y.o. female.     Patient with history of previous stroke, GERD on omeprazole, irritable bowel syndrome, recent endoscopy last month with Dr. Lyndel Safe showing mild gastritis, negative biopsy --presents to the emergency department with 1 week of sore throat.  Pain is described as burning.  Pain is making it difficult for her to swallow.  No fevers.  She has had some pain in her left ear but no other runny nose, congestion.  No cough or shortness of breath.  She is able to swallow however.  Saw her dentist 3 or 4 days ago and they gave her some "lozenges" for thrush in the back of her throat.  No history of diabetes.  She does not use any inhaled or other systemic steroids.  No recent antibiotics. No history of immunocompromise.  The lozenges have not been helping much.  Onset of symptoms acute.  Course is constant.     Past Medical History:  Diagnosis Date  . Anemia   . Arthritis    trigger finger in left hand  . Complication of anesthesia    per pt, hard to wake up!  . Elevated cholesterol   . Female bladder prolapse    per urologist, does not have prolaspe  . GERD (gastroesophageal reflux disease)   . Heart murmur    pt unsure.  . Hiatal hernia   . Hypertension   . IBS (irritable bowel syndrome)   . Multiple myeloma ([place]) 2005   had partial chemo  . Peripheral neuropathy   . SOB (shortness of breath) on exertion    uses an inhaler  . Stroke [facility]) 2017   paralysis left arm/uses a walker    Patient Active Problem List   Diagnosis Date Noted  . 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 B12 deficiency 12/19/2016  . Chest pain 12/18/2016  . Cerebellar infarct ([place])   . Diastolic dysfunction   . Marijuana abuse   . History of  tobacco abuse   . Benign essential HTN   . Tachycardia   . Hypokalemia   . Stroke (cerebrum) ([place]) 12/05/2016  . Nonintractable headache 12/05/2016  . Occipital stroke ([place]) 12/05/2016  . Hypertensive urgency 12/05/2016  . Hypertension     Past Surgical History:  Procedure Laterality Date  . BONE BIOPSY  2005   in her back  . COLONOSCOPY     over 10 years x3  . ESOPHAGOGASTRODUODENOSCOPY     incomplete-over 10 years ago   . TEE WITHOUT CARDIOVERSION N/A 12/08/2016   Procedure: TRANSESOPHAGEAL ECHOCARDIOGRAM (TEE);  Surgeon: Sueanne Margarita, MD;  Location: [facility] ENDOSCOPY;  Service: Cardiovascular;  Laterality: N/A;  . TUBAL LIGATION    . UPPER GASTROINTESTINAL ENDOSCOPY    . WISDOM TOOTH EXTRACTION       OB History   No obstetric history on file.      Home Medications    Prior to Admission medications   Medication Sig Start Date End Date Taking? Authorizing Provider  albuterol (PROVENTIL HFA;VENTOLIN HFA) 108 (90 Base) MCG/ACT inhaler Inhale 2 puffs into the lungs every 4 (four) hours as needed for wheezing or shortness of breath (cough, shortness of breath or wheezing.). 04/24/18   Azzie Glatter, FNP  amitriptyline (ELAVIL) 10 MG tablet Take 1 tablet (10 mg total) by mouth at bedtime. 09/06/18   Jamse Arn, MD  amLODipine (NORVASC) 10 MG tablet Take 1 tablet (10 mg total) by mouth daily. 07/24/18   Azzie Glatter, FNP  aspirin 325 MG tablet Take 1 tablet (325 mg total) by mouth daily. 12/09/16   Mikhail, Velta Addison, DO  atorvastatin (LIPITOR) 40 MG tablet Take 1 tablet (40 mg total) by mouth daily. 08/09/18   Azzie Glatter, FNP  cyanocobalamin 1000 MCG tablet Take 1 tablet (1,000 mcg total) by mouth daily. 02/05/18   Azzie Glatter, FNP  dimenhyDRINATE (DRAMAMINE) 50 MG tablet Take 50 mg by mouth every 8 (eight) hours as needed for dizziness.    [provider]  docusate sodium (COLACE) 100 MG capsule Take 200 mg by mouth 2 (two) times daily.     [provider]  DULoxetine (CYMBALTA) 30 MG capsule Take 1 capsule (30 mg total) by mouth daily. 09/06/18   Jamse Arn, MD  Ergocalciferol (VITAMIN D2) 10 MCG (400 UNIT) TABS Take 10 mcg by mouth daily. 02/06/18   Azzie Glatter, FNP  furosemide (LASIX) 20 MG tablet Take 1 tablet (20 mg total) by mouth 2 (two) times daily as needed. 04/22/18   Azzie Glatter, FNP  hydrOXYzine (VISTARIL) 50 MG capsule Take 1-2 capsules (50-100 mg total) by mouth 3 (three) times daily as needed for itching. 03/07/18   Azzie Glatter, FNP  methocarbamol (ROBAXIN) 500 MG tablet Take 1 tablet (500 mg total) by mouth 3 (three) times daily. Patient taking differently: Take 500 mg by mouth as needed.  05/21/17   Scot Jun, FNP  Multiple Vitamin (MULTIVITAMIN) capsule Take 4 capsules by mouth 2 (two) times daily. Centrum plus    [provider]  NIFEdipine (ADALAT CC) 30 MG 24 hr tablet Take 2 tablets (60 mg total) by mouth daily. 04/02/18   Azzie Glatter, FNP  omeprazole (PRILOSEC) 20 MG capsule Take 1 capsule (20 mg total) by mouth 2 (two) times daily before a meal. 05/22/18   Jackquline Denmark, MD  polyethylene glycol powder (GLYCOLAX/MIRALAX) powder Take 17 g 2 (two) times daily as needed by mouth. 01/23/17   Scot Jun, FNP  Potassium Chloride ER 20 MEQ TBCR Take 20 mEq by mouth daily for 30 days. 08/09/18 09/08/18  Azzie Glatter, FNP  pregabalin (LYRICA) 75 MG capsule Take 1 capsule (75 mg total) by mouth 3 (three) times daily. 08/05/18   Venancio Poisson, NP  senna (SENOKOT) 8.6 MG TABS tablet Take 2 tablets by mouth 2 (two) times daily.    [provider]  triamcinolone cream (KENALOG) 0.1 % Apply 1 application topically 2 (two) times daily. 01/15/17   Scot Jun, FNP    Family History Family History  Problem Relation Age of Onset  . Hypertension Mother   . Sarcoidosis Mother        currently in remission   . Diverticulitis Mother   . Irritable bowel  syndrome Mother   . Liver cancer Mother   . Hypertension Father   . Stomach cancer Father   . Congestive Heart Failure Father   . Stroke Maternal Uncle   . Scoliosis Brother   . Colon cancer Neg Hx   . Esophageal cancer Neg Hx   . Colon polyps Neg Hx   . Rectal cancer Neg Hx     Social History Social History   Tobacco Use  .  Smoking status: Former Smoker    Quit date: 12/06/2015    Years since quitting: 2.8  . Smokeless tobacco: Never Used  Substance Use Topics  . Alcohol use: Not Currently  . Drug use: Not Currently    Types: Marijuana    Comment: in the past      Allergies   Naproxen, Other, Darvon [propoxyphene], Hydrocodone, Lactose intolerance (gi), Latex, Oxycodone, Percocet [oxycodone-acetaminophen], and Topamax [topiramate]   Review of Systems Review of Systems  Constitutional: Negative for fever.  HENT: Positive for ear pain and sore throat. Negative for rhinorrhea.   Eyes: Negative for redness.  Respiratory: Negative for cough.   Cardiovascular: Negative for chest pain.  Gastrointestinal: Negative for abdominal pain, diarrhea, nausea and vomiting.  Genitourinary: Negative for dysuria.  Musculoskeletal: Negative for myalgias.  Skin: Negative for rash.  Neurological: Negative for headaches.     Physical Exam Updated Vital Signs BP (!) 152/95 (BP Location: Right Arm)   Pulse (!) 108   Temp 99 F (37.2 C) (Oral)   Resp 20   Ht 5' 1"  (1.549 m)   Wt 74.8 kg   SpO2 96%   BMI 31.18 kg/m   Physical Exam Vitals signs and nursing note reviewed.  Constitutional:      Appearance: She is well-developed.  HENT:     Head: Normocephalic and atraumatic.     Right Ear: Tympanic membrane and ear canal normal. No tenderness. Tympanic membrane is not erythematous.     Left Ear: Tympanic membrane and ear canal normal. No tenderness. Tympanic membrane is not erythematous.     Nose: No congestion or rhinorrhea.     Mouth/Throat:     Mouth: Mucous membranes are  moist.     Pharynx: Oropharyngeal exudate and posterior oropharyngeal erythema present. No pharyngeal swelling.     Comments: White plaque covering the posterior oropharynx.  Eyes:     General:        Right eye: No discharge.        Left eye: No discharge.     Conjunctiva/sclera: Conjunctivae normal.  Neck:     Musculoskeletal: Normal range of motion and neck supple.  Cardiovascular:     Rate and Rhythm: Normal rate and regular rhythm.     Heart sounds: Normal heart sounds.  Pulmonary:     Effort: Pulmonary effort is normal.     Breath sounds: Normal breath sounds.  Abdominal:     Palpations: Abdomen is soft.     Tenderness: There is no abdominal tenderness.  Skin:    General: Skin is warm and dry.  Neurological:     Mental Status: She is alert.      ED Treatments / Results  Labs (all labs ordered are listed, but only abnormal results are displayed) Labs Reviewed  GROUP A STREP BY PCR    EKG None  Radiology No results found.  Procedures Procedures (including critical care time)  Medications Ordered in ED Medications - No data to display   Initial Impression / Assessment and Plan / ED Course  I have reviewed the triage vital signs and the nursing notes.  Pertinent labs & imaging results that were available during my care of the patient were reviewed by me and considered in my medical decision making (see chart for details).        Patient seen and examined.  Strep is pending.  Will check spot CBG.  If negative, will start on course of Diflucan to treat empiric candidal pharyngitis/esophagitis.  Patient will need to follow-up with her GI doctor for further evaluation.  Vital signs reviewed and are as follows: BP (!) 152/95 (BP Location: Right Arm)   Pulse (!) 108   Temp 99 F (37.2 C) (Oral)   Resp 20   Ht 5' 1"  (1.549 m)   Wt 74.8 kg   SpO2 96%   BMI 31.18 kg/m   2:16 PM normal CBG, negative strep.  Will treat for oral candidal pharyngitis.   Patient be given Diflucan 200 mg x 10 days.  Strongly encouraged PCP/GI follow-up to ensure resolution.  Encourage return with worsening pain, inability to move neck or swallow, new symptoms such as fever.  Final Clinical Impressions(s) / ED Diagnoses   Final diagnoses:  Oral pharyngeal candidiasis   Patient with sore throat and white plaques suspicious for oral candidiasis.  Strep test is negative.  Patient without other systemic symptoms.  Normal blood sugar today.  No history of immunocompromise.  No recent antibiotics or immunosuppressants.  Will treat empirically with Diflucan and have patient follow-up.  ED Discharge Orders    None       Carlisle Cater, Hershal Coria 09/28/18 1417    Margette Fast, MD 09/28/18 2011

## 2018-09-28 NOTE — Discharge Instructions (Signed)
Please read and follow all provided instructions.  Your diagnoses today include:  1. Oral pharyngeal candidiasis     Tests performed today include:  Strep test: was negative for strep throat  Vital signs. See below for your results today.   Medications prescribed:   Diflucan - anti-yeast medication for thrush  Home care instructions:  Please read the educational materials provided and follow any instructions contained in this packet.  Follow-up instructions: Please follow-up with your primary care or GI provider in 5 days for a recheck.  Return instructions:   Please return to the Emergency Department if you experience worsening symptoms.   Return if you have worsening problems swallowing, your neck becomes swollen, you cannot swallow your saliva or your voice becomes muffled.   Return with high persistent fever, persistent vomiting, or if you have trouble breathing.   Please return if you have any other emergent concerns.  Additional Information:  Your vital signs today were: BP (!) 152/95 (BP Location: Right Arm)    Pulse (!) 108    Temp 99 F (37.2 C) (Oral)    Resp 20    Ht 5\' 1"  (1.549 m)    Wt 74.8 kg    SpO2 96%    BMI 31.18 kg/m  If your blood pressure (BP) was elevated above 135/85 this visit, please have this repeated by your doctor within one month. --------------

## 2018-09-30 ENCOUNTER — Ambulatory Visit: Payer: Medicaid Other | Admitting: Physical Therapy

## 2018-10-01 ENCOUNTER — Ambulatory Visit: Payer: Medicaid Other | Admitting: Family Medicine

## 2018-10-03 ENCOUNTER — Encounter: Payer: Medicaid Other | Attending: Physical Medicine & Rehabilitation | Admitting: Physical Medicine & Rehabilitation

## 2018-10-03 DIAGNOSIS — E559 Vitamin D deficiency, unspecified: Secondary | ICD-10-CM | POA: Insufficient documentation

## 2018-10-07 ENCOUNTER — Ambulatory Visit: Payer: Medicaid Other

## 2018-10-08 ENCOUNTER — Other Ambulatory Visit: Payer: Self-pay | Admitting: Family Medicine

## 2018-10-08 ENCOUNTER — Encounter: Payer: Self-pay | Admitting: Family Medicine

## 2018-10-08 ENCOUNTER — Ambulatory Visit (INDEPENDENT_AMBULATORY_CARE_PROVIDER_SITE_OTHER): Payer: Medicaid Other | Admitting: Family Medicine

## 2018-10-08 ENCOUNTER — Other Ambulatory Visit: Payer: Self-pay

## 2018-10-08 VITALS — BP 132/84 | HR 94 | Temp 98.1°F | Ht 61.0 in | Wt 162.2 lb

## 2018-10-08 DIAGNOSIS — Z131 Encounter for screening for diabetes mellitus: Secondary | ICD-10-CM | POA: Diagnosis not present

## 2018-10-08 DIAGNOSIS — R7303 Prediabetes: Secondary | ICD-10-CM | POA: Diagnosis not present

## 2018-10-08 DIAGNOSIS — R0602 Shortness of breath: Secondary | ICD-10-CM | POA: Diagnosis not present

## 2018-10-08 DIAGNOSIS — M79604 Pain in right leg: Secondary | ICD-10-CM | POA: Insufficient documentation

## 2018-10-08 DIAGNOSIS — K5900 Constipation, unspecified: Secondary | ICD-10-CM | POA: Diagnosis not present

## 2018-10-08 DIAGNOSIS — M79605 Pain in left leg: Secondary | ICD-10-CM | POA: Diagnosis not present

## 2018-10-08 DIAGNOSIS — N390 Urinary tract infection, site not specified: Secondary | ICD-10-CM | POA: Diagnosis not present

## 2018-10-08 DIAGNOSIS — B370 Candidal stomatitis: Secondary | ICD-10-CM | POA: Diagnosis not present

## 2018-10-08 DIAGNOSIS — R8290 Unspecified abnormal findings in urine: Secondary | ICD-10-CM | POA: Diagnosis not present

## 2018-10-08 DIAGNOSIS — I639 Cerebral infarction, unspecified: Secondary | ICD-10-CM

## 2018-10-08 DIAGNOSIS — Z09 Encounter for follow-up examination after completed treatment for conditions other than malignant neoplasm: Secondary | ICD-10-CM

## 2018-10-08 LAB — POCT URINALYSIS DIP (MANUAL ENTRY)
Bilirubin, UA: NEGATIVE
Blood, UA: NEGATIVE
Glucose, UA: NEGATIVE mg/dL
Ketones, POC UA: NEGATIVE mg/dL
Nitrite, UA: NEGATIVE
Protein Ur, POC: NEGATIVE mg/dL
Spec Grav, UA: 1.02 (ref 1.010–1.025)
Urobilinogen, UA: 0.2 E.U./dL
pH, UA: 5.5 (ref 5.0–8.0)

## 2018-10-08 LAB — POCT GLYCOSYLATED HEMOGLOBIN (HGB A1C): Hemoglobin A1C: 6.2 % — AB (ref 4.0–5.6)

## 2018-10-08 MED ORDER — SULFAMETHOXAZOLE-TRIMETHOPRIM 800-160 MG PO TABS: 1.0000 | ORAL_TABLET | Freq: Two times a day (BID) | ORAL | 0 refills | Status: AC

## 2018-10-08 MED ORDER — ALBUTEROL SULFATE HFA 108 (90 BASE) MCG/ACT IN AERS: 2.0000 | INHALATION_SPRAY | RESPIRATORY_TRACT | 6 refills | Status: DC | PRN

## 2018-10-08 MED ORDER — FLUCONAZOLE 200 MG PO TABS: 200.0000 mg | ORAL_TABLET | Freq: Every day | ORAL | 0 refills | Status: AC

## 2018-10-08 NOTE — Progress Notes (Signed)
Patient [name] Follow Up  Subjective:  Patient ID: Brittany [name], female    DOB: December 07, 1965  Age: 53 y.o. MRN: [id number]  CC:  Chief Complaint  Patient presents with  . Hospitalization Follow-up    Thrush  . Back Pain    HPI Brittany [name] is a 53 year female presents for Hospital Follow Up today.    Past Medical History:  Diagnosis Date  . Anemia   . Arthritis    trigger finger in left hand  . Complication of anesthesia    per pt, hard to wake up!  . Elevated cholesterol   . Female bladder prolapse    per urologist, does not have prolaspe  . GERD (gastroesophageal reflux disease)   . Heart murmur    pt unsure.  . Hiatal hernia   . Hypertension   . IBS (irritable bowel syndrome)   . Multiple myeloma ([place]) 2005   had partial chemo  . Peripheral neuropathy   . SOB (shortness of breath) on exertion    uses an inhaler  . 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 She continues to have pain in sorenes in her mouth and throat. She arrives via a 'rollator' for assistance with ambulation. She has residual bilateral weakness in legs and left upper extremity weakness. She continues with Physical Therapy r/t recent Stroke X once weekly. She states that PT is helping her greatly.  She denies visual changes, chest pain, cough, shortness of breath, heart palpitations, and falls. She has occasional headaches and dizziness with position changes. Denies severe headaches, confusion, seizures, double vision, and blurred vision, nausea and vomiting.  She denies fevers, chills, fatigue, recent infections, weight loss, and night sweats. She has not had any  and falls. No chest pain, heart palpitations, cough and shortness of breath reported. she has c/o constipation. No reports of  GI problems such as diarrhea. She has no reports of blood in stools, dysuria and hematuria. No depression or anxiety reported. She denies pain today.   Past Surgical History:  Procedure Laterality Date  . BONE BIOPSY  2005   in her back  . COLONOSCOPY     over 10 years x3  . ESOPHAGOGASTRODUODENOSCOPY     incomplete-over 10 years ago   . TEE WITHOUT CARDIOVERSION N/A 12/08/2016   Procedure: TRANSESOPHAGEAL ECHOCARDIOGRAM (TEE);  Surgeon: Sueanne Margarita, MD;  Location: [facility] ENDOSCOPY;  Service: Cardiovascular;  Laterality: N/A;  . TUBAL LIGATION    . UPPER GASTROINTESTINAL ENDOSCOPY    . WISDOM TOOTH EXTRACTION      Family History  Problem Relation Age of Onset  . Hypertension Mother   . Sarcoidosis Mother        currently in remission   . Diverticulitis Mother   . Irritable bowel syndrome Mother   . Liver cancer Mother   . Hypertension Father   . Stomach cancer Father   . Congestive Heart Failure Father   . Stroke Maternal Uncle   . Scoliosis Brother   . Colon cancer Neg Hx   . Esophageal cancer Neg Hx   . Colon polyps Neg Hx   . Rectal cancer Neg Hx     Social History   Socioeconomic History  . Marital status: Legally Separated    Spouse name:  Not on file  . Number of children: Not on file  . Years of education: Not on file  . Highest education level: Not on file  Occupational History  . Occupation: Disabled  Social Needs  . Financial resource strain: Not on file  . Food insecurity    Worry: Not on file    Inability: Not on file  . Transportation needs    Medical: Not on file    Non-medical: Not on file  Tobacco Use  . Smoking status: Former Smoker    Quit date: 12/06/2015    Years since quitting: 2.8  . Smokeless tobacco: Never Used  Substance and Sexual Activity  . Alcohol use: Not Currently  . Drug use: Not Currently    Types: Marijuana    Comment: in the past   . Sexual activity: Not on file  Lifestyle  . Physical activity    Days per week: Not on  file    Minutes per session: Not on file  . Stress: Not on file  Relationships  . Social Herbalist on phone: Not on file    Gets together: Not on file    Attends religious service: Not on file    Active member of club or organization: Not on file    Attends meetings of clubs or organizations: Not on file    Relationship status: Not on file  . Intimate partner violence    Fear of current or ex partner: Not on file    Emotionally abused: Not on file    Physically abused: Not on file    Forced sexual activity: Not on file  Other Topics Concern  . Not on file  Social History Narrative  . Not on file    Outpatient Medications Prior to Visit  Medication Sig Dispense Refill  . amitriptyline (ELAVIL) 10 MG tablet Take 1 tablet (10 mg total) by mouth at bedtime. 30 tablet 1  . amLODipine (NORVASC) 10 MG tablet Take 1 tablet (10 mg total) by mouth daily. 30 tablet 2  . aspirin 325 MG tablet Take 1 tablet (325 mg total) by mouth daily. 30 tablet 0  . atorvastatin (LIPITOR) 40 MG tablet Take 1 tablet (40 mg total) by mouth daily. 30 tablet 3  . cyanocobalamin 1000 MCG tablet Take 1 tablet (1,000 mcg total) by mouth daily. 30 tablet 4  . dimenhyDRINATE (DRAMAMINE) 50 MG tablet Take 50 mg by mouth every 8 (eight) hours as needed for dizziness.    . docusate sodium (COLACE) 100 MG capsule Take 200 mg by mouth 2 (two) times daily.     . DULoxetine (CYMBALTA) 30 MG capsule Take 1 capsule (30 mg total) by mouth daily. 30 capsule 1  . Ergocalciferol (VITAMIN D2) 10 MCG (400 UNIT) TABS Take 10 mcg by mouth daily. 30 tablet 6  . furosemide (LASIX) 20 MG tablet Take 1 tablet (20 mg total) by mouth 2 (two) times daily as needed. 60 tablet 2  . hydrOXYzine (VISTARIL) 50 MG capsule Take 1-2 capsules (50-100 mg total) by mouth 3 (three) times daily as needed for itching. 30 capsule 1  . methocarbamol (ROBAXIN) 500 MG tablet Take 1 tablet (500 mg total) by mouth 3 (three) times daily. (Patient  taking differently: Take 500 mg by mouth as needed. ) 90 tablet 2  . Multiple Vitamin (MULTIVITAMIN) capsule Take 4 capsules by mouth 2 (two) times daily. Centrum plus    . NIFEdipine (ADALAT CC) 30 MG 24 hr  tablet Take 2 tablets (60 mg total) by mouth daily. 60 tablet 6  . omeprazole (PRILOSEC) 20 MG capsule Take 1 capsule (20 mg total) by mouth 2 (two) times daily before a meal. 60 capsule 3  . polyethylene glycol powder (GLYCOLAX/MIRALAX) powder Take 17 g 2 (two) times daily as needed by mouth. 3350 g 1  . pregabalin (LYRICA) 75 MG capsule Take 1 capsule (75 mg total) by mouth 3 (three) times daily. 90 capsule 0  . senna (SENOKOT) 8.6 MG TABS tablet Take 2 tablets by mouth 2 (two) times daily.    Marland Kitchen triamcinolone cream (KENALOG) 0.1 % Apply 1 application topically 2 (two) times daily. 454 g 0  . albuterol (PROVENTIL HFA;VENTOLIN HFA) 108 (90 Base) MCG/ACT inhaler Inhale 2 puffs into the lungs every 4 (four) hours as needed for wheezing or shortness of breath (cough, shortness of breath or wheezing.). 1 Inhaler 5  . Potassium Chloride ER 20 MEQ TBCR Take 20 mEq by mouth daily for 30 days. 30 tablet 3  . fluconazole (DIFLUCAN) 200 MG tablet Take 1 tablet (200 mg total) by mouth daily for 10 days. 10 tablet 0   No facility-administered medications prior to visit.     Allergies  Allergen Reactions  . Naproxen     Vomiting, sweating, abd spasms  . Other     States can't take pain meds that end in "cet" or meds that end in "ine" Darvocet/severe vomiting  . Darvon [Propoxyphene] Nausea And Vomiting  . Hydrocodone Nausea And Vomiting  . Lactose Intolerance (Gi)     Bloating, gas, abd pain  . Latex Itching and Rash  . Oxycodone Nausea And Vomiting  . Percocet [Oxycodone-Acetaminophen] Nausea And Vomiting  . Topamax [Topiramate]     Memory made her emotional     ROS Review of Systems  Constitutional: Negative.   HENT: Negative.   Eyes: Negative.   Respiratory: Negative.    Cardiovascular: Negative.   Gastrointestinal: Positive for constipation.  Endocrine: Negative.   Genitourinary: Negative.   Musculoskeletal: Positive for arthralgias (bilataral leg pain) and back pain (chronic).  Skin: Negative.   Allergic/Immunologic: Negative.   Neurological: Positive for dizziness (occasional ), weakness (bilateral lower extremities) and headaches (occasional).  Hematological: Negative.   Psychiatric/Behavioral: Negative.       Objective:    Physical Exam  Constitutional: She is oriented to person, place, and time. She appears well-developed and well-nourished.  HENT:  Head: Normocephalic and atraumatic.  Eyes: Conjunctivae are normal.  Neck: Normal range of motion. Neck supple.  Cardiovascular: Normal rate, normal heart sounds and intact distal pulses.  Pulmonary/Chest: Effort normal and breath sounds normal.  Abdominal: Soft. Bowel sounds are normal.  Musculoskeletal:     Comments: Limited ROM in lower extremities.   Neurological: She is alert and oriented to person, place, and time. She has normal reflexes.  Skin: Skin is warm and dry.  Psychiatric: She has a normal mood and affect. Her behavior is normal. Judgment and thought content normal.  Nursing note and vitals reviewed.   BP 132/84 (BP Location: Left Arm, Patient Position: Sitting, Cuff Size: Small)   Pulse 94   Temp 98.1 F (36.7 C) (Oral)   Ht 5' 1"  (1.549 m)   Wt 162 lb 3.2 oz (73.6 kg)   SpO2 100%   BMI 30.65 kg/m  Wt Readings from Last 3 Encounters:  10/08/18 162 lb 3.2 oz (73.6 kg)  09/28/18 165 lb (74.8 kg)  09/06/18 173 lb 6.4 oz (  78.7 kg)     Health Maintenance Due  Topic Date Due  . PAP SMEAR-Modifier  11/17/1986  . MAMMOGRAM  11/17/2015    There are no preventive care reminders to display for this patient.  Lab Results  Component Value Date   TSH 2.690 01/23/2018   Lab Results  Component Value Date   WBC 6.6 03/05/2018   HGB 12.5 03/05/2018   HCT 39.0 03/05/2018    MCV 83.9 03/05/2018   PLT 256 03/05/2018   Lab Results  Component Value Date   NA 139 03/05/2018   K 3.5 03/05/2018   CO2 22 03/05/2018   GLUCOSE 94 03/05/2018   BUN 7 03/05/2018   CREATININE 0.85 03/05/2018   BILITOT 0.4 08/21/2017   ALKPHOS 130 (H) 08/21/2017   AST 16 08/21/2017   ALT 12 08/21/2017   PROT 7.7 01/23/2018   ALBUMIN 4.7 08/21/2017   CALCIUM 9.2 03/05/2018   ANIONGAP 8 03/05/2018   Lab Results  Component Value Date   CHOL 209 (H) 08/21/2017   Lab Results  Component Value Date   HDL 44 08/21/2017   Lab Results  Component Value Date   LDLCALC 138 (H) 08/21/2017   Lab Results  Component Value Date   TRIG 133 08/21/2017   Lab Results  Component Value Date   CHOLHDL 4.8 (H) 08/21/2017   Lab Results  Component Value Date   HGBA1C 6.2 (A) 10/08/2018      Assessment & Plan:   1. Hospital discharge follow-up  2. Oral thrush We will initiate Fluconazole today.  - fluconazole (DIFLUCAN) 200 MG tablet; Take 1 tablet (200 mg total) by mouth daily for 10 days.  Dispense: 10 tablet; Refill: 0  3. Abnormal urinalysis Results are pending.  - Urine Culture  4. Cerebrovascular accident (CVA), unspecified mechanism ([place]) Stable.   5. Bilateral leg pain  6. Constipation, unspecified constipation type Stable.   7. Shortness of breath - albuterol (VENTOLIN HFA) 108 (90 Base) MCG/ACT inhaler; Inhale 2 puffs into the lungs every 4 (four) hours as needed for wheezing or shortness of breath (cough, shortness of breath or wheezing.).  Dispense: 6.7 g; Refill: 6  8. Urinary tract infection without hematuria, site unspecified We will initiate Bactrim today.  - sulfamethoxazole-trimethoprim (BACTRIM DS) 800-160 MG tablet; Take 1 tablet by mouth 2 (two) times daily for 7 days.  Dispense: 14 tablet; Refill: 0  9. Screening for diabetes mellitus Hgb A1c is stable at 6.2 today. She will continue to decrease foods/beverages high in sugars and carbs and follow  Heart Healthy or DASH diet. Increase physical activity to at least 30 minutes cardio exercise daily.  - POCT glycosylated hemoglobin (Hb A1C) - POCT urinalysis dipstick  10. Follow up She will follow up in 3 months.   Meds ordered this encounter  Medications  . albuterol (VENTOLIN HFA) 108 (90 Base) MCG/ACT inhaler    Sig: Inhale 2 puffs into the lungs every 4 (four) hours as needed for wheezing or shortness of breath (cough, shortness of breath or wheezing.).    Dispense:  6.7 g    Refill:  6  . sulfamethoxazole-trimethoprim (BACTRIM DS) 800-160 MG tablet    Sig: Take 1 tablet by mouth 2 (two) times daily for 7 days.    Dispense:  14 tablet    Refill:  0  . fluconazole (DIFLUCAN) 200 MG tablet    Sig: Take 1 tablet (200 mg total) by mouth daily for 10 days.    Dispense:  10 tablet    Refill:  0    Orders Placed This Encounter  Procedures  . Urine Culture  . POCT glycosylated hemoglobin (Hb A1C)  . POCT urinalysis dipstick    Referral Orders  No referral(s) requested today    Kathe Becton,  MSN, FNP-BC [address] [place], [place] [address] [phone] fax    Problem List Items Addressed This Visit      Cardiovascular and Mediastinum   Stroke (cerebrum) [facility])    Other Visit Diagnoses    Hospital discharge follow-up    -  Primary   Oral thrush       Relevant Medications   sulfamethoxazole-trimethoprim (BACTRIM DS) 800-160 MG tablet   fluconazole (DIFLUCAN) 200 MG tablet   Abnormal urinalysis       Relevant Orders   Urine Culture   Bilateral leg pain       Constipation, unspecified constipation type       Shortness of breath       Relevant Medications   albuterol (VENTOLIN HFA) 108 (90 Base) MCG/ACT inhaler   Urinary tract infection without hematuria, site unspecified       Relevant Medications   sulfamethoxazole-trimethoprim (BACTRIM DS) 800-160 MG tablet    fluconazole (DIFLUCAN) 200 MG tablet   Prediabetes       Screening for diabetes mellitus       Relevant Orders   POCT glycosylated hemoglobin (Hb A1C) (Completed)   POCT urinalysis dipstick (Completed)   Follow up          Meds ordered this encounter  Medications  . albuterol (VENTOLIN HFA) 108 (90 Base) MCG/ACT inhaler    Sig: Inhale 2 puffs into the lungs every 4 (four) hours as needed for wheezing or shortness of breath (cough, shortness of breath or wheezing.).    Dispense:  6.7 g    Refill:  6  . sulfamethoxazole-trimethoprim (BACTRIM DS) 800-160 MG tablet    Sig: Take 1 tablet by mouth 2 (two) times daily for 7 days.    Dispense:  14 tablet    Refill:  0  . fluconazole (DIFLUCAN) 200 MG tablet    Sig: Take 1 tablet (200 mg total) by mouth daily for 10 days.    Dispense:  10 tablet    Refill:  0    Follow-up: Return in about 3 months (around 01/08/2019).    Azzie Glatter, FNP

## 2018-10-08 NOTE — Patient Instructions (Signed)

## 2018-10-14 ENCOUNTER — Ambulatory Visit: Payer: Medicaid Other | Admitting: Physical Therapy

## 2018-10-15 ENCOUNTER — Other Ambulatory Visit: Payer: Self-pay

## 2018-10-15 ENCOUNTER — Other Ambulatory Visit: Payer: Medicaid Other

## 2018-10-15 ENCOUNTER — Emergency Department (HOSPITAL_BASED_OUTPATIENT_CLINIC_OR_DEPARTMENT_OTHER)
Admission: EM | Admit: 2018-10-15 | Discharge: 2018-10-15 | Disposition: A | Discharge status: Home / Self Care | Payer: Medicaid Other | Attending: Emergency Medicine | Admitting: Emergency Medicine

## 2018-10-15 ENCOUNTER — Encounter (HOSPITAL_BASED_OUTPATIENT_CLINIC_OR_DEPARTMENT_OTHER): Payer: Self-pay | Admitting: *Deleted

## 2018-10-15 ENCOUNTER — Emergency Department (HOSPITAL_BASED_OUTPATIENT_CLINIC_OR_DEPARTMENT_OTHER): Payer: Medicaid Other

## 2018-10-15 DIAGNOSIS — I10 Essential (primary) hypertension: Secondary | ICD-10-CM | POA: Diagnosis not present

## 2018-10-15 DIAGNOSIS — Z79899 Other long term (current) drug therapy: Secondary | ICD-10-CM | POA: Insufficient documentation

## 2018-10-15 DIAGNOSIS — R070 Pain in throat: Secondary | ICD-10-CM | POA: Diagnosis not present

## 2018-10-15 DIAGNOSIS — R079 Chest pain, unspecified: Secondary | ICD-10-CM | POA: Diagnosis not present

## 2018-10-15 DIAGNOSIS — R0602 Shortness of breath: Secondary | ICD-10-CM | POA: Diagnosis not present

## 2018-10-15 DIAGNOSIS — Z87891 Personal history of nicotine dependence: Secondary | ICD-10-CM | POA: Insufficient documentation

## 2018-10-15 DIAGNOSIS — R0789 Other chest pain: Secondary | ICD-10-CM | POA: Diagnosis not present

## 2018-10-15 DIAGNOSIS — R05 Cough: Secondary | ICD-10-CM | POA: Insufficient documentation

## 2018-10-15 DIAGNOSIS — R799 Abnormal finding of blood chemistry, unspecified: Secondary | ICD-10-CM | POA: Diagnosis not present

## 2018-10-15 DIAGNOSIS — R0689 Other abnormalities of breathing: Secondary | ICD-10-CM | POA: Diagnosis not present

## 2018-10-15 DIAGNOSIS — R52 Pain, unspecified: Secondary | ICD-10-CM | POA: Diagnosis not present

## 2018-10-15 DIAGNOSIS — R110 Nausea: Secondary | ICD-10-CM | POA: Diagnosis not present

## 2018-10-15 LAB — COMPREHENSIVE METABOLIC PANEL
ALT: 15 U/L (ref 0–44)
AST: 20 U/L (ref 15–41)
Albumin: 4.7 g/dL (ref 3.5–5.0)
Alkaline Phosphatase: 169 U/L — ABNORMAL HIGH (ref 38–126)
Anion gap: 15 (ref 5–15)
BUN: 11 mg/dL (ref 6–20)
CO2: 18 mmol/L — ABNORMAL LOW (ref 22–32)
Calcium: 9.8 mg/dL (ref 8.9–10.3)
Chloride: 100 mmol/L (ref 98–111)
Creatinine, Ser: 1.17 mg/dL — ABNORMAL HIGH (ref 0.44–1.00)
GFR calc Af Amer: >60 mL/min (ref >60)
GFR calc non Af Amer: 54 mL/min — ABNORMAL LOW (ref >60)
Glucose, Bld: 139 mg/dL — ABNORMAL HIGH (ref 70–99)
Potassium: 4.1 mmol/L (ref 3.5–5.1)
Sodium: 133 mmol/L — ABNORMAL LOW (ref 135–145)
Total Bilirubin: 0.9 mg/dL (ref 0.3–1.2)
Total Protein: 8.9 g/dL — ABNORMAL HIGH (ref 6.5–8.1)

## 2018-10-15 LAB — URINALYSIS, ROUTINE W REFLEX MICROSCOPIC
Bilirubin Urine: NEGATIVE
Glucose, UA: NEGATIVE mg/dL
Hgb urine dipstick: NEGATIVE
Ketones, ur: NEGATIVE mg/dL
Leukocytes,Ua: NEGATIVE
Nitrite: NEGATIVE
Protein, ur: 30 mg/dL — AB
Specific Gravity, Urine: 1.025 (ref 1.005–1.030)
pH: 6 (ref 5.0–8.0)

## 2018-10-15 LAB — CBC WITH DIFFERENTIAL/PLATELET
Abs Immature Granulocytes: 0.02 10*3/uL (ref 0.00–0.07)
Basophils Absolute: 0 10*3/uL (ref 0.0–0.1)
Basophils Relative: 0 %
Eosinophils Absolute: 0 10*3/uL (ref 0.0–0.5)
Eosinophils Relative: 0 %
HCT: 42.6 % (ref 36.0–46.0)
Hemoglobin: 14.2 g/dL (ref 12.0–15.0)
Immature Granulocytes: 0 %
Lymphocytes Relative: 40 %
Lymphs Abs: 3.4 10*3/uL (ref 0.7–4.0)
MCH: 27 pg (ref 26.0–34.0)
MCHC: 33.3 g/dL (ref 30.0–36.0)
MCV: 81.1 fL (ref 80.0–100.0)
Monocytes Absolute: 0.7 10*3/uL (ref 0.1–1.0)
Monocytes Relative: 8 %
Neutro Abs: 4.4 10*3/uL (ref 1.7–7.7)
Neutrophils Relative %: 52 %
Platelets: 300 10*3/uL (ref 150–400)
RBC: 5.25 MIL/uL — ABNORMAL HIGH (ref 3.87–5.11)
RDW: 13.4 % (ref 11.5–15.5)
Smear Review: NORMAL
WBC Morphology: >10
WBC: 8.6 10*3/uL (ref 4.0–10.5)
nRBC: 0 % (ref 0.0–0.2)

## 2018-10-15 LAB — D-DIMER, QUANTITATIVE: D-Dimer, Quant: 0.5 ug/mL-FEU (ref 0.00–0.50)

## 2018-10-15 LAB — URINALYSIS, MICROSCOPIC (REFLEX): RBC / HPF: NONE SEEN RBC/hpf (ref 0–5)

## 2018-10-15 MED ORDER — DIAZEPAM 5 MG PO TABS: 5.0000 mg | ORAL_TABLET | Freq: Two times a day (BID) | ORAL | 0 refills | Status: DC | PRN

## 2018-10-15 MED ORDER — LORAZEPAM 2 MG/ML IJ SOLN
1.0000 mg | Freq: Once | INTRAMUSCULAR | Status: AC
  Administered 2018-10-15: 1 mg via INTRAVENOUS
  Filled 2018-10-15: qty 1

## 2018-10-15 MED ORDER — ONDANSETRON HCL 4 MG/2ML IJ SOLN
4.0000 mg | Freq: Once | INTRAMUSCULAR | Status: AC
  Administered 2018-10-15: 16:00:00 4 mg via INTRAVENOUS
  Filled 2018-10-15: qty 2

## 2018-10-15 MED ORDER — SODIUM CHLORIDE 0.9 % IV BOLUS
1000.0000 mL | Freq: Once | INTRAVENOUS | Status: AC
  Administered 2018-10-15: 1000 mL via INTRAVENOUS

## 2018-10-15 NOTE — ED Triage Notes (Signed)
To ED via EMS c.o sore throat. States she has been taking antibiotics for thrush and UTI. Her Covid test a week ago was negative. She is hyperventilating at triage.

## 2018-10-15 NOTE — ED Provider Notes (Signed)
[facility] EMERGENCY DEPARTMENT Provider Note   CSN: [id number] Arrival date & time: 10/15/18  1412    History   Chief Complaint Chief Complaint  Patient presents with  . Sore Throat  . Rib Pain    HPI Brittany [name] is a 53 y.o. female.     Pt presents to the ED today with right sided CP.  She said it's been going on for awhile.  She has had a cough, but tested negative for covid 1 week ago.  She has had thrush, but it is gone now.  She denies any f/c.  No sob.     Past Medical History:  Diagnosis Date  . Anemia   . Arthritis    trigger finger in left hand  . Complication of anesthesia    per pt, hard to wake up!  . Elevated cholesterol   . Female bladder prolapse    per urologist, does not have prolaspe  . GERD (gastroesophageal reflux disease)   . Heart murmur    pt unsure.  . Hiatal hernia   . Hypertension   . IBS (irritable bowel syndrome)   . Multiple myeloma ([place]) 2005   had partial chemo  . Peripheral neuropathy   . SOB (shortness of breath) on exertion    uses an inhaler  . Stroke [facility]) 2017   paralysis left arm/uses a walker    Patient Active Problem List   Diagnosis Date Noted  . Oral thrush 10/08/2018  . Bilateral leg pain 10/08/2018  . Constipation 10/08/2018  . Shortness of breath 10/08/2018  . 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 B12 deficiency 12/19/2016  . Chest pain 12/18/2016  . Cerebellar infarct ([place])   . Diastolic dysfunction   . Marijuana abuse   . History of tobacco abuse   . Benign essential HTN   . Tachycardia   . Hypokalemia   . Stroke (cerebrum) ([place]) 12/05/2016  . Nonintractable headache 12/05/2016  . Occipital stroke ([place]) 12/05/2016  . Hypertensive urgency 12/05/2016  . Hypertension     Past Surgical History:  Procedure Laterality Date  . BONE BIOPSY  2005   in her back  . COLONOSCOPY     over 10 years x3  . ESOPHAGOGASTRODUODENOSCOPY     incomplete-over 10 years ago   .  TEE WITHOUT CARDIOVERSION N/A 12/08/2016   Procedure: TRANSESOPHAGEAL ECHOCARDIOGRAM (TEE);  Surgeon: Sueanne Margarita, MD;  Location[facility] ENDOSCOPY;  Service: Cardiovascular;  Laterality: N/A;  . TUBAL LIGATION    . UPPER GASTROINTESTINAL ENDOSCOPY    . WISDOM TOOTH EXTRACTION       OB History   No obstetric history on file.      Home Medications    Prior to Admission medications   Medication Sig Start Date End Date Taking? Authorizing Provider  albuterol (VENTOLIN HFA) 108 (90 Base) MCG/ACT inhaler Inhale 2 puffs into the lungs every 4 (four) hours as needed for wheezing or shortness of breath (cough, shortness of breath or wheezing.). 10/08/18   Azzie Glatter, FNP  amitriptyline (ELAVIL) 10 MG tablet Take 1 tablet (10 mg total) by mouth at bedtime. 09/06/18   Jamse Arn, MD  amLODipine (NORVASC) 10 MG tablet Take 1 tablet (10 mg total) by mouth daily. 07/24/18   Azzie Glatter, FNP  aspirin 325 MG tablet Take 1 tablet (325 mg total) by mouth daily. 12/09/16   Mikhail, Velta Addison, DO  atorvastatin (LIPITOR) 40 MG tablet Take 1 tablet (40  mg total) by mouth daily. 08/09/18   Azzie Glatter, FNP  cyanocobalamin 1000 MCG tablet Take 1 tablet (1,000 mcg total) by mouth daily. 02/05/18   Azzie Glatter, FNP  diazepam (VALIUM) 5 MG tablet Take 1 tablet (5 mg total) by mouth every 12 (twelve) hours as needed for anxiety or muscle spasms. 10/15/18   Isla Pence, MD  dimenhyDRINATE (DRAMAMINE) 50 MG tablet Take 50 mg by mouth every 8 (eight) hours as needed for dizziness.    [provider]  docusate sodium (COLACE) 100 MG capsule Take 200 mg by mouth 2 (two) times daily.     [provider]  DULoxetine (CYMBALTA) 30 MG capsule Take 1 capsule (30 mg total) by mouth daily. 09/06/18   Jamse Arn, MD  Ergocalciferol (VITAMIN D2) 10 MCG (400 UNIT) TABS Take 10 mcg by mouth daily. 02/06/18   Azzie Glatter, FNP  fluconazole (DIFLUCAN) 200 MG tablet Take 1 tablet  (200 mg total) by mouth daily for 10 days. 10/08/18 10/18/18  Azzie Glatter, FNP  furosemide (LASIX) 20 MG tablet Take 1 tablet (20 mg total) by mouth 2 (two) times daily as needed. 04/22/18   Azzie Glatter, FNP  hydrOXYzine (VISTARIL) 50 MG capsule Take 1-2 capsules (50-100 mg total) by mouth 3 (three) times daily as needed for itching. 03/07/18   Azzie Glatter, FNP  methocarbamol (ROBAXIN) 500 MG tablet Take 1 tablet (500 mg total) by mouth 3 (three) times daily. Patient taking differently: Take 500 mg by mouth as needed.  05/21/17   Scot Jun, FNP  Multiple Vitamin (MULTIVITAMIN) capsule Take 4 capsules by mouth 2 (two) times daily. Centrum plus    [provider]  NIFEdipine (ADALAT CC) 30 MG 24 hr tablet Take 2 tablets (60 mg total) by mouth daily. 04/02/18   Azzie Glatter, FNP  omeprazole (PRILOSEC) 20 MG capsule Take 1 capsule (20 mg total) by mouth 2 (two) times daily before a meal. 05/22/18   Jackquline Denmark, MD  polyethylene glycol powder (GLYCOLAX/MIRALAX) powder Take 17 g 2 (two) times daily as needed by mouth. 01/23/17   Scot Jun, FNP  Potassium Chloride ER 20 MEQ TBCR Take 20 mEq by mouth daily for 30 days. 08/09/18 09/08/18  Azzie Glatter, FNP  pregabalin (LYRICA) 75 MG capsule Take 1 capsule (75 mg total) by mouth 3 (three) times daily. 08/05/18   Venancio Poisson, NP  senna (SENOKOT) 8.6 MG TABS tablet Take 2 tablets by mouth 2 (two) times daily.    [provider]  sulfamethoxazole-trimethoprim (BACTRIM DS) 800-160 MG tablet Take 1 tablet by mouth 2 (two) times daily for 7 days. 10/08/18 10/15/18  Azzie Glatter, FNP  triamcinolone cream (KENALOG) 0.1 % Apply 1 application topically 2 (two) times daily. 01/15/17   Scot Jun, FNP    Family History Family History  Problem Relation Age of Onset  . Hypertension Mother   . Sarcoidosis Mother        currently in remission   . Diverticulitis Mother   . Irritable bowel syndrome  Mother   . Liver cancer Mother   . Hypertension Father   . Stomach cancer Father   . Congestive Heart Failure Father   . Stroke Maternal Uncle   . Scoliosis Brother   . Colon cancer Neg Hx   . Esophageal cancer Neg Hx   . Colon polyps Neg Hx   . Rectal cancer Neg Hx  Social History Social History   Tobacco Use  . Smoking status: Former Smoker    Quit date: 12/06/2015    Years since quitting: 2.8  . Smokeless tobacco: Never Used  Substance Use Topics  . Alcohol use: Not Currently  . Drug use: Not Currently    Types: Marijuana    Comment: in the past      Allergies   Naproxen, Other, Darvon [propoxyphene], Hydrocodone, Lactose intolerance (gi), Latex, Oxycodone, Percocet [oxycodone-acetaminophen], and Topamax [topiramate]   Review of Systems Review of Systems  Cardiovascular: Positive for chest pain.  All other systems reviewed and are negative.    Physical Exam Updated Vital Signs BP 122/72   Pulse 92   Temp 98.5 F (36.9 C) (Oral)   Resp 17   Ht _0  (1.549 m)   Wt 73.6 kg   SpO2 100%   BMI 30.66 kg/m   Physical Exam Vitals signs and nursing note reviewed.  Constitutional:      Appearance: She is well-developed.  HENT:     Head: Normocephalic and atraumatic.     Mouth/Throat:     Mouth: Mucous membranes are moist.     Pharynx: Oropharynx is clear.  Eyes:     Conjunctiva/sclera: Conjunctivae normal.     Pupils: Pupils are equal, round, and reactive to light.  Neck:     Musculoskeletal: Normal range of motion and neck supple.  Cardiovascular:     Rate and Rhythm: Regular rhythm. Tachycardia present.     Heart sounds: Normal heart sounds.  Pulmonary:     Effort: Pulmonary effort is normal.     Breath sounds: Normal breath sounds.  Chest:    Abdominal:     General: Bowel sounds are normal.     Palpations: Abdomen is soft.  Skin:    General: Skin is warm.     Capillary Refill: Capillary refill takes less than 2 seconds.  Neurological:      General: No focal deficit present.     Mental Status: She is alert and oriented to person, place, and time.  Psychiatric:        Mood and Affect: Mood normal.        Behavior: Behavior normal.      ED Treatments / Results  Labs (all labs ordered are listed, but only abnormal results are displayed) Labs Reviewed  CBC WITH DIFFERENTIAL/PLATELET - Abnormal; Notable for the following components:      Result Value   RBC 5.25 (*)    All other components within normal limits  COMPREHENSIVE METABOLIC PANEL - Abnormal; Notable for the following components:   Sodium 133 (*)    CO2 18 (*)    Glucose, Bld 139 (*)    Creatinine, Ser 1.17 (*)    Total Protein 8.9 (*)    Alkaline Phosphatase 169 (*)    GFR calc non Af Amer 54 (*)    All other components within normal limits  URINALYSIS, ROUTINE W REFLEX MICROSCOPIC - Abnormal; Notable for the following components:   Protein, ur 30 (*)    All other components within normal limits  URINALYSIS, MICROSCOPIC (REFLEX) - Abnormal; Notable for the following components:   Bacteria, UA RARE (*)    All other components within normal limits  D-DIMER, QUANTITATIVE (NOT AT [facility])  PATHOLOGIST SMEAR REVIEW    EKG EKG Interpretation  Date/Time:  Tuesday October 15 2018 16:09:04 EDT Ventricular Rate:  101 PR Interval:    QRS Duration: 79 QT Interval:  357 QTC Calculation: 463 R Axis:   28 Text Interpretation:  Sinus tachycardia Probable left atrial enlargement Borderline T wave abnormalities Since last tracing rate faster Confirmed by Isla Pence [phone]) on 10/15/2018 4:26:18 PM   Radiology Dg Chest 2 View  Result Date: 10/15/2018 CLINICAL DATA:  Cough and chest pain EXAM: CHEST - 2 VIEW COMPARISON:  December 18, 2016 FINDINGS: The lungs are clear. Heart size and pulmonary vascularity are normal. No adenopathy. No pneumothorax. No bone lesions. No pneumothorax. IMPRESSION: No edema or consolidation. Electronically Signed   By: Lowella Grip  III M.D.   On: 10/15/2018 17:23    Procedures Procedures (including critical care time)  Medications Ordered in ED Medications  sodium chloride 0.9 % bolus 1,000 mL (0 mLs Intravenous Stopped 10/15/18 1806)  ondansetron (ZOFRAN) injection 4 mg (4 mg Intravenous Given 10/15/18 1609)  LORazepam (ATIVAN) injection 1 mg (1 mg Intravenous Given 10/15/18 1608)     Initial Impression / Assessment and Plan / ED Course  I have reviewed the triage vital signs and the nursing notes.  Pertinent labs & imaging results that were available during my care of the patient were reviewed by me and considered in my medical decision making (see chart for details).       Pt feels much better.  Pain is likely msk.  She is stable for d/c.  Return if worse.  Final Clinical Impressions(s) / ED Diagnoses   Final diagnoses:  Right-sided chest wall pain    ED Discharge Orders         Ordered    diazepam (VALIUM) 5 MG tablet  Every 12 hours PRN     10/15/18 Crissie Figures, MD 10/15/18 1906

## 2018-10-15 NOTE — ED Notes (Signed)
Patient verbalizes understanding of discharge instructions. Opportunity for questioning and answers were provided. Armband removed by staff, pt discharged from ED.  

## 2018-10-16 LAB — PATHOLOGIST SMEAR REVIEW: Path Review: INCREASED

## 2018-10-21 ENCOUNTER — Encounter: Payer: Self-pay | Admitting: Physical Therapy

## 2018-10-21 ENCOUNTER — Ambulatory Visit: Payer: Medicaid Other | Attending: Family Medicine | Admitting: Physical Therapy

## 2018-10-21 ENCOUNTER — Other Ambulatory Visit: Payer: Self-pay

## 2018-10-21 VITALS — BP 145/90 | HR 77

## 2018-10-21 DIAGNOSIS — R29898 Other symptoms and signs involving the musculoskeletal system: Secondary | ICD-10-CM | POA: Diagnosis present

## 2018-10-21 DIAGNOSIS — R42 Dizziness and giddiness: Secondary | ICD-10-CM | POA: Insufficient documentation

## 2018-10-21 DIAGNOSIS — R261 Paralytic gait: Secondary | ICD-10-CM

## 2018-10-21 DIAGNOSIS — R262 Difficulty in walking, not elsewhere classified: Secondary | ICD-10-CM | POA: Diagnosis present

## 2018-10-21 DIAGNOSIS — R2681 Unsteadiness on feet: Secondary | ICD-10-CM | POA: Diagnosis present

## 2018-10-21 DIAGNOSIS — M6281 Muscle weakness (generalized): Secondary | ICD-10-CM | POA: Diagnosis present

## 2018-10-21 NOTE — Therapy (Signed)
[facility] [address]  [place], [place], [address] Phone: [phone]   Fax:  [phone]  Physical Therapy Treatment  Patient Details  Name: Brittany [name] MRN: [id number] Date of Birth: November 23, 1965 Referring Provider (PT): Kathe Becton, FNP   Encounter Date: 10/21/2018  PT End of Session - 10/21/18 1141    Visit Number  18    Number of Visits  22    Date for PT Re-Evaluation  10/21/18    Authorization Type  Medicaid    Authorization Time Period  07/02 - 08/12    Authorization - Visit Number  2    Authorization - Number of Visits  6    PT Start Time  1102    PT Stop Time  1144    PT Time Calculation (min)  42 min    Equipment Utilized During Treatment  Gait belt    Activity Tolerance  Patient tolerated treatment well    Behavior During Therapy  [facility] for tasks assessed/performed       Past Medical History:  Diagnosis Date  . Anemia   . Arthritis    trigger finger in left hand  . Complication of anesthesia    per pt, hard to wake up!  . Elevated cholesterol   . Female bladder prolapse    per urologist, does not have prolaspe  . GERD (gastroesophageal reflux disease)   . Heart murmur    pt unsure.  . Hiatal hernia   . Hypertension   . IBS (irritable bowel syndrome)   . Multiple myeloma ([place]) 2005   had partial chemo  . Peripheral neuropathy   . SOB (shortness of breath) on exertion    uses an inhaler  . Stroke [facility]) 2017   paralysis left arm/uses a walker    Past Surgical History:  Procedure Laterality Date  . BONE BIOPSY  2005   in her back  . COLONOSCOPY     over 10 years x3  . ESOPHAGOGASTRODUODENOSCOPY     incomplete-over 10 years ago   . TEE WITHOUT CARDIOVERSION N/A 12/08/2016   Procedure: TRANSESOPHAGEAL ECHOCARDIOGRAM (TEE);  Surgeon: Sueanne Margarita, MD;  Location: [facility] ENDOSCOPY;  Service: Cardiovascular;  Laterality: N/A;  . TUBAL LIGATION    . UPPER GASTROINTESTINAL ENDOSCOPY     . WISDOM TOOTH EXTRACTION      Vitals:   10/21/18 1105  BP: (!) 145/90  Pulse: 77  SpO2: 100%    Subjective Assessment - 10/21/18 1109    Subjective  Reports that she has been sick- had a sore throat and oral thrush. Also been having chest pain. On saturday she fell and hurt her R side- soreness on R arm and hip. Was tested for COVID-19 and it was negative.    Pertinent History  stroke, peripheral neuropathy, multiple myeloma, HTN, hiatal hernia, TEE with cardioversion    Limitations  Sitting;Lifting;Standing;Walking;House hold activities    Diagnostic tests  none recent    Patient Stated Goals  work on leg strength    Currently in Pain?  Yes    Pain Score  6     Pain Location  Shoulder    Pain Orientation  Right    Pain Descriptors / Indicators  Aching    Pain Type  Acute pain                       [facility] Adult PT Treatment/Exercise - 10/21/18 0001  Knee/Hip Exercises: Aerobic   Nustep  L3 x 6 min (UE/LEs)      Knee/Hip Exercises: Standing   Other Standing Knee Exercises  2x20 high knee marching on foam       Knee/Hip Exercises: Seated   Other Seated Knee/Hip Exercises  STS on foam x5 with no UE support    Marching  --    Marching Limitations  --    Sit to Sand  1 set;10 reps;without UE support   cues for set up and upright posture     Knee/Hip Exercises: Supine   Bridges  Strengthening;Both;1 set;10 reps    Bridges Limitations  good form    Single Leg Bridge  Strengthening;Both;1 set;10 reps   marching bridge   Other Supine Knee/Hip Exercises  deadbug x10 each UE/LE      Knee/Hip Exercises: Sidelying   Hip ABduction  Strengthening;Right;Left;1 set;10 reps    Hip ABduction Limitations  manual cues for form and set up             PT Education - 10/21/18 1145    Education Details  update to [employer]) Educated  Patient    Methods  Explanation;Demonstration;Tactile cues;Verbal cues;Handout    Comprehension  Verbalized  understanding;Returned demonstration       PT Short Term Goals - 09/09/18 1509      PT SHORT TERM GOAL #1   Title  Patient to be independent with initial HEP.    Time  1    Period  Weeks    Status  Achieved    Target Date  05/28/18        PT Long Term Goals - 09/09/18 1508      PT LONG TERM GOAL #1   Title  Patient to be independent with advanced HEP.    Time  6    Period  Weeks    Status  Partially Met   met for current   Target Date  10/21/18      PT LONG TERM GOAL #2   Title  Patient to score B LE strength atleast 3+/5.     Time  6    Period  Weeks    Status  Achieved      PT LONG TERM GOAL #3   Title  Patient to improve TUG score by 10 sec in order to make progress towards decreased risk of falls.    Baseline  05/21/18 TUG 25.7 sec    Time  3    Period  Weeks    Status  Achieved   06/11/18 15.5 sec      PT LONG TERM GOAL #4   Title  Patient to improve 5xSTS score by 10 sec in order to make progress towards decreased risk of falls.    Baseline  05/21/18 5xSTS 28.9 sec with B UE support    Time  6    Period  Weeks    Status  Achieved   09/09/18: 18.62 sec     PT LONG TERM GOAL #5   Title  Patient to report tolerance of standing/walking for 15 min before pain or fatigue limit her.     Time  6    Period  Weeks    Status  Partially Met   10 min at this time   Target Date  10/21/18      Additional Long Term Goals   Additional Long Term Goals  Yes      PT LONG  TERM GOAL #6   Title  Patient to demonstrate 0 deg of B ankle AROM to decrease risk of falls.     Time  3    Period  Weeks    Status  Achieved      PT LONG TERM GOAL #7   Title  Patient to score <14 sec on TUG with LRAD to decrease risk of falls.     Time  6    Period  Weeks    Status  On-going   09/09/18: 18.87 sec with 4WW   Target Date  10/21/18      PT LONG TERM GOAL #8   Title  Patient to demonstrate 10 deg of B ankle AROM to decrease risk of falls.     Time  6    Period  Weeks     Status  Achieved      PT LONG TERM GOAL  #9   TITLE  Patient to report <2/10 dizziness and mild sway with standing horizontal and vertical head turns while gaze fixed on target.     Time  6    Period  Weeks    Status  Partially Met   report of 2-3/10 dizziness with horizontal head turns with walker support; no dizziness with vertical head turns   Target Date  10/21/18      PT LONG TERM GOAL  #10   TITLE  Patient to score atleast 4/5 in B LE strength.    Time  6    Period  Weeks    Status  New    Target Date  10/21/18            Plan - 10/21/18 1151    Clinical Impression Statement  Patient returns to session after being ill- was tested for COVID-19 and this was negative. Had a fall on Saturday, hurting her R shoulder and hip. Currently having pain over R collarbone- advised patient to seek medical attention if this pain continues d/t potential fracture. Patient TTP over R distal triceps and distal clavicle but without bruising or excessive swelling. Worked on dynamic balance exercises on compliant surface without UE support- patient demonstrating ankle instability but able to self-correct imbalance well. Tolerated all core and hip strengthening on mat. Patient requesting update to HEP with theses exercises. No complaints at end of session.    Comorbidities  stroke, peripheral neuropathy, multiple myeloma, HTN, hiatal hernia, TEE with cardioversion    PT Treatment/Interventions  ADLs/Self Care Home Management;Cryotherapy;Electrical Stimulation;Functional mobility training;Stair training;Gait training;DME Instruction;Moist Heat;Therapeutic activities;Therapeutic exercise;Balance training;Neuromuscular re-education;Patient/family education;Passive range of motion;Manual techniques;Dry needling;Energy conservation;Splinting;Taping;Orthotic Fit/Training;Vasopneumatic Device;Vestibular    PT Next Visit Plan  continue challenging standing balance & gait, work on gaze stabilization and  habituation    Consulted and Agree with Plan of Care  Patient       Patient will benefit from skilled therapeutic intervention in order to improve the following deficits and impairments:  Abnormal gait, Decreased endurance, Decreased activity tolerance, Decreased strength, Pain, Decreased balance, Decreased mobility, Difficulty walking, Increased muscle spasms, Improper body mechanics, Decreased range of motion, Decreased safety awareness, Impaired flexibility, Postural dysfunction, Dizziness  Visit Diagnosis: 1. Paralytic gait   2. Difficulty in walking, not elsewhere classified   3. Muscle weakness (generalized)   4. Unsteadiness on feet   5. Dizziness and giddiness   6. Other symptoms and signs involving the musculoskeletal system        Problem List Patient Active Problem List   Diagnosis Date Noted  .  Oral thrush 10/08/2018  . Bilateral leg pain 10/08/2018  . Constipation 10/08/2018  . Shortness of breath 10/08/2018  . 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 B12 deficiency 12/19/2016  . Chest pain 12/18/2016  . Cerebellar infarct ([place])   . Diastolic dysfunction   . Marijuana abuse   . History of tobacco abuse   . Benign essential HTN   . Tachycardia   . Hypokalemia   . Stroke (cerebrum) ([place]) 12/05/2016  . Nonintractable headache 12/05/2016  . Occipital stroke ([place]) 12/05/2016  . Hypertensive urgency 12/05/2016  . Hypertension      Janene Harvey, PT, DPT 10/21/18 11:55 AM   [facility] [address]  [place], [place], [address] Phone: [phone]   Fax:  [phone]  Name: Charnika Herbst MRN: [id number] Date of Birth: October 16, 1965

## 2018-10-28 ENCOUNTER — Other Ambulatory Visit: Payer: Self-pay

## 2018-10-28 ENCOUNTER — Encounter: Payer: Self-pay | Admitting: Physical Therapy

## 2018-10-28 ENCOUNTER — Ambulatory Visit: Payer: Medicaid Other | Admitting: Physical Therapy

## 2018-10-28 DIAGNOSIS — R42 Dizziness and giddiness: Secondary | ICD-10-CM | POA: Diagnosis not present

## 2018-10-28 DIAGNOSIS — R262 Difficulty in walking, not elsewhere classified: Secondary | ICD-10-CM

## 2018-10-28 DIAGNOSIS — M6281 Muscle weakness (generalized): Secondary | ICD-10-CM | POA: Diagnosis not present

## 2018-10-28 DIAGNOSIS — R2681 Unsteadiness on feet: Secondary | ICD-10-CM | POA: Diagnosis not present

## 2018-10-28 DIAGNOSIS — R29898 Other symptoms and signs involving the musculoskeletal system: Secondary | ICD-10-CM | POA: Diagnosis not present

## 2018-10-28 DIAGNOSIS — R261 Paralytic gait: Secondary | ICD-10-CM | POA: Diagnosis not present

## 2018-10-28 NOTE — Therapy (Signed)
[facility] [address]  [place], [place], [address] Phone: [phone]   Fax:  [phone]  Physical Therapy Progress Note  Patient Details  Name: Brittany [name] MRN: [id number] Date of Birth: [birth year] Referring Provider (PT): Kathe Becton, FNP  Progress Note Reporting Period 08/08/18 to 10/28/18  See note below for Objective Data and Assessment of Progress/Goals.     Encounter Date: 10/28/2018  PT End of Session - 10/28/18 1207    Visit Number  19    Number of Visits  23    Date for PT Re-Evaluation  11/25/18    Authorization Type  Medicaid    Authorization Time Period  07/02 - 08/12    Authorization - Visit Number  3    Authorization - Number of Visits  6    PT Start Time  1107    PT Stop Time  1148    PT Time Calculation (min)  41 min    Activity Tolerance  Patient tolerated treatment well    Behavior During Therapy  WFL for tasks assessed/performed       Past Medical History:  Diagnosis Date  . Anemia   . Arthritis    trigger finger in left hand  . Complication of anesthesia    per pt, hard to wake up!  . Elevated cholesterol   . Female bladder prolapse    per urologist, does not have prolaspe  . GERD (gastroesophageal reflux disease)   . Heart murmur    pt unsure.  . Hiatal hernia   . Hypertension   . IBS (irritable bowel syndrome)   . Multiple myeloma ([place]) 2005   had partial chemo  . Peripheral neuropathy   . SOB (shortness of breath) on exertion    uses an inhaler  . Stroke [facility]) 2017   paralysis left arm/uses a walker    Past Surgical History:  Procedure Laterality Date  . BONE BIOPSY  2005   in her back  . COLONOSCOPY     over 10 years x3  . ESOPHAGOGASTRODUODENOSCOPY     incomplete-over 10 years ago   . TEE WITHOUT CARDIOVERSION N/A 12/08/2016   Procedure: TRANSESOPHAGEAL ECHOCARDIOGRAM (TEE);  Surgeon: Sueanne Margarita, MD;  Location: [facility] ENDOSCOPY;  Service:  Cardiovascular;  Laterality: N/A;  . TUBAL LIGATION    . UPPER GASTROINTESTINAL ENDOSCOPY    . WISDOM TOOTH EXTRACTION      There were no vitals filed for this visit.  Subjective Assessment - 10/28/18 1109    Subjective  Reports that she is feeling like herself again. She is having a good day. Reports 75% improvement since initial eval. Would like to continue working on her balance as she had a recent fall. Is scheduled for an AFO fitting on the 12th.    Pertinent History  stroke, peripheral neuropathy, multiple myeloma, HTN, hiatal hernia, TEE with cardioversion    Diagnostic tests  none recent    Patient Stated Goals  work on leg strength    Currently in Pain?  Yes         [facility] PT Assessment - 10/28/18 0001      Assessment   Medical Diagnosis  CVA, B LE pain    Referring Provider (PT)  Kathe Becton, FNP    Onset Date/Surgical Date  12/18/16      Strength   Right Hip Flexion  4+/5    Right Hip ABduction  4/5    Right Hip  ADduction  4/5    Left Hip Flexion  4+/5    Left Hip ABduction  4/5    Left Hip ADduction  4/5    Right Knee Flexion  4/5    Right Knee Extension  4+/5    Left Knee Flexion  4/5    Left Knee Extension  4+/5    Right Ankle Dorsiflexion  4/5    Right Ankle Plantar Flexion  4/5   heavy reliance on UEs   Left Ankle Dorsiflexion  4/5    Left Ankle Plantar Flexion  4/5   heavy reliance on UEs     Standardized Balance Assessment   Standardized Balance Assessment  Timed Up and Go Test      Timed Up and Go Test   Normal TUG (seconds)  12.33                   [facility] Adult PT Treatment/Exercise - 10/28/18 0001      Neuro Re-ed    Neuro Re-ed Details   standing horizontal VOR with UE support on chair 2x10; vertical 2x10   c/o feeling "crisscrossed" & double vision; 5/10 with vertic     Knee/Hip Exercises: Aerobic   Nustep  L4 x 6 min (UE/LEs)      Knee/Hip Exercises: Sidelying   Hip ADduction  Strengthening;Right;Left;2 sets;10 reps     Hip ADduction Limitations  opposite LE propped on bolster             PT Education - 10/28/18 1207    Education Details  update/consolidation of HEP; discussion on objective progress and remaining impairments    Person(s) Educated  Patient    Methods  Explanation;Demonstration;Tactile cues;Verbal cues;Handout    Comprehension  Verbalized understanding;Returned demonstration       PT Short Term Goals - 10/28/18 1113      PT SHORT TERM GOAL #1   Title  Patient to be independent with initial HEP.    Time  1    Period  Weeks    Status  Achieved    Target Date  05/28/18        PT Long Term Goals - 10/28/18 1113      PT LONG TERM GOAL #1   Title  Patient to be independent with advanced HEP.    Time  4    Period  Weeks    Status  Partially Met   met for current   Target Date  11/25/18      PT LONG TERM GOAL #2   Title  Patient to score B LE strength atleast 3+/5.     Time  6    Period  Weeks    Status  Achieved      PT LONG TERM GOAL #3   Title  Patient to improve TUG score by 10 sec in order to make progress towards decreased risk of falls.    Baseline  05/21/18 TUG 25.7 sec    Time  3    Period  Weeks    Status  Achieved   06/11/18 15.5 sec      PT LONG TERM GOAL #4   Title  Patient to improve 5xSTS score by 10 sec in order to make progress towards decreased risk of falls.    Baseline  05/21/18 5xSTS 28.9 sec with B UE support    Time  6    Period  Weeks    Status  Achieved   09/09/18: 18.62 sec  PT LONG TERM GOAL #5   Title  Patient to report tolerance of standing/walking for 15 min before pain or fatigue limit her.     Time  4    Period  Weeks    Status  Partially Met   10 min at this time- able to stand and wash dishes now   Target Date  11/25/18      Additional Long Term Goals   Additional Long Term Goals  Yes      PT LONG TERM GOAL #6   Title  Patient to demonstrate 0 deg of B ankle AROM to decrease risk of falls.     Time  3    Period   Weeks    Status  Achieved      PT LONG TERM GOAL #7   Title  Patient to score <14 sec on TUG with LRAD to decrease risk of falls.     Time  6    Period  Weeks    Status  Achieved   12.33 sec with 4WN     PT LONG TERM GOAL #8   Title  Patient to demonstrate 10 deg of B ankle AROM to decrease risk of falls.     Time  6    Period  Weeks    Status  Achieved      PT LONG TERM GOAL  #9   TITLE  Patient to report <2/10 dizziness and mild sway with standing horizontal and vertical head turns while gaze fixed on target.     Time  4    Period  Weeks    Status  Partially Met   report of 5/10 dizziness with vertical head turns with chair support   Target Date  11/25/18      PT LONG TERM GOAL  #10   TITLE  Patient to score atleast 4/5 in B LE strength.    Time  6    Period  Weeks    Status  Achieved   showing improvements in R knee extension, L knee flexion and extension, B ankle dorsiflexion and plantar flexion strength     PT LONG TERM GOAL  #11   TITLE  Patient to score atleast 4+/5 in B LE strength.    Time  4    Period  Weeks    Status  New    Target Date  11/25/18      PT LONG TERM GOAL  #12   TITLE  Patient to report <=1/10 dizziness and good stability with bending forward to pick something off the ground.    Time  4    Period  Weeks    Status  New    Target Date  11/25/18            Plan - 10/28/18 1208    Clinical Impression Statement  Patient arrived to session with report that she is feeling better after recent illness. Notes 75% improvement since starting PT. Would like to continue working on her balance, as she has had a recent fall, as well as gait training with her new AFO which is scheduled for fitting this week. Strength testing revealed improvements in R knee extension, L knee flexion and extension, B ankle dorsiflexion and plantar flexion strength. Patient has scored 12.33 sec on TUG testing, indicating decreased risk of falls. Patient has now met strength  and gait speed goals. Reports that she is still limited to 10 min of standing/walking at this time. Today  reporting 0/10 dizziness with horizontal VOR, but with remaining 5/10 dizziness with vertical VOR. Patient struggles with vertical displacement of head and body such as vertical head turns, leaning forward, elevators. Plan to continue working on habituation and VOR to address these issues as patient's dizziness puts her at risk of recurrent falls. Patient tolerated today's session well. Reported understanding of HEP update and consolidation and without complaints at end of session. Patient is showing steady progress without plateau, thus would benefit from continued skilled PT services 1x/week for 4 weeks to address remaining goals.    Comorbidities  stroke, peripheral neuropathy, multiple myeloma, HTN, hiatal hernia, TEE with cardioversion    Rehab Potential  Good    PT Frequency  1x / week    PT Duration  4 weeks    PT Treatment/Interventions  ADLs/Self Care Home Management;Cryotherapy;Electrical Stimulation;Functional mobility training;Stair training;Gait training;DME Instruction;Moist Heat;Therapeutic activities;Therapeutic exercise;Balance training;Neuromuscular re-education;Patient/family education;Passive range of motion;Manual techniques;Dry needling;Energy conservation;Splinting;Taping;Orthotic Fit/Training;Vasopneumatic Device;Vestibular    PT Next Visit Plan  continue challenging standing balance & gait, work on gaze stabilization and habituation    Consulted and Agree with Plan of Care  Patient       Patient will benefit from skilled therapeutic intervention in order to improve the following deficits and impairments:  Abnormal gait, Decreased endurance, Decreased activity tolerance, Decreased strength, Pain, Decreased balance, Decreased mobility, Difficulty walking, Increased muscle spasms, Improper body mechanics, Decreased range of motion, Decreased safety awareness, Impaired  flexibility, Postural dysfunction, Dizziness  Visit Diagnosis: 1. Paralytic gait   2. Difficulty in walking, not elsewhere classified   3. Muscle weakness (generalized)   4. Unsteadiness on feet   5. Dizziness and giddiness   6. Other symptoms and signs involving the musculoskeletal system        Problem List Patient Active Problem List   Diagnosis Date Noted  . Oral thrush 10/08/2018  . Bilateral leg pain 10/08/2018  . Constipation 10/08/2018  . Shortness of breath 10/08/2018  . 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 B12 deficiency 12/19/2016  . Chest pain 12/18/2016  . Cerebellar infarct ([place])   . Diastolic dysfunction   . Marijuana abuse   . History of tobacco abuse   . Benign essential HTN   . Tachycardia   . Hypokalemia   . Stroke (cerebrum) ([place]) 12/05/2016  . Nonintractable headache 12/05/2016  . Occipital stroke ([place]) 12/05/2016  . Hypertensive urgency 12/05/2016  . Hypertension      Janene Harvey, PT, DPT 10/28/18 12:13 PM   [facility] [address]  [place], [place], [address] Phone: [phone]   Fax:  [phone]  Name: Brittany [name] MRN: [id number] Date of Birth: [birth year]

## 2018-11-04 ENCOUNTER — Other Ambulatory Visit: Payer: Self-pay

## 2018-11-04 ENCOUNTER — Ambulatory Visit: Payer: Medicaid Other | Admitting: Physical Therapy

## 2018-11-04 ENCOUNTER — Encounter: Payer: Self-pay | Admitting: Physical Therapy

## 2018-11-04 VITALS — BP 142/90 | HR 92

## 2018-11-04 DIAGNOSIS — R29898 Other symptoms and signs involving the musculoskeletal system: Secondary | ICD-10-CM

## 2018-11-04 DIAGNOSIS — M6281 Muscle weakness (generalized): Secondary | ICD-10-CM | POA: Diagnosis not present

## 2018-11-04 DIAGNOSIS — R42 Dizziness and giddiness: Secondary | ICD-10-CM | POA: Diagnosis not present

## 2018-11-04 DIAGNOSIS — R262 Difficulty in walking, not elsewhere classified: Secondary | ICD-10-CM

## 2018-11-04 DIAGNOSIS — R261 Paralytic gait: Secondary | ICD-10-CM | POA: Diagnosis not present

## 2018-11-04 DIAGNOSIS — R2681 Unsteadiness on feet: Secondary | ICD-10-CM | POA: Diagnosis not present

## 2018-11-04 NOTE — Therapy (Signed)
[facility] [address]  [place], [place], [address] Phone: [phone]   Fax:  [phone]  Physical Therapy Treatment  Patient Details  Name: Brittany [name] MRN: [id number] Date of Birth: 13-Aug-1965 Referring Provider (PT): Kathe Becton, FNP   Encounter Date: 11/04/2018  PT End of Session - 11/04/18 1154    Visit Number  20    Number of Visits  23    Date for PT Re-Evaluation  11/25/18    Authorization Type  Medicaid    Authorization Time Period  4 visits from 08/17 - 09/13    Authorization - Visit Number  1    Authorization - Number of Visits  4    PT Start Time  1019    PT Stop Time  1101    PT Time Calculation (min)  42 min    Activity Tolerance  Patient tolerated treatment well    Behavior During Therapy  [place] for tasks assessed/performed       Past Medical History:  Diagnosis Date  . Anemia   . Arthritis    trigger finger in left hand  . Complication of anesthesia    per pt, hard to wake up!  . Elevated cholesterol   . Female bladder prolapse    per urologist, does not have prolaspe  . GERD (gastroesophageal reflux disease)   . Heart murmur    pt unsure.  . Hiatal hernia   . Hypertension   . IBS (irritable bowel syndrome)   . Multiple myeloma ([place]) 2005   had partial chemo  . Peripheral neuropathy   . SOB (shortness of breath) on exertion    uses an inhaler  . Stroke [facility]) 2017   paralysis left arm/uses a walker    Past Surgical History:  Procedure Laterality Date  . BONE BIOPSY  2005   in her back  . COLONOSCOPY     over 10 years x3  . ESOPHAGOGASTRODUODENOSCOPY     incomplete-over 10 years ago   . TEE WITHOUT CARDIOVERSION N/A 12/08/2016   Procedure: TRANSESOPHAGEAL ECHOCARDIOGRAM (TEE);  Surgeon: Sueanne Margarita, MD;  Location: [facility] ENDOSCOPY;  Service: Cardiovascular;  Laterality: N/A;  . TUBAL LIGATION    . UPPER GASTROINTESTINAL ENDOSCOPY    . WISDOM TOOTH EXTRACTION       Vitals:   11/04/18 1047  BP: (!) 142/90  Pulse: 92  SpO2: 96%    Subjective Assessment - 11/04/18 1020    Subjective  She got her B AFOs and says that she feels like a brand new person. No issues with them so far.    Pertinent History  stroke, peripheral neuropathy, multiple myeloma, HTN, hiatal hernia, TEE with cardioversion    Diagnostic tests  none recent    Patient Stated Goals  work on leg strength    Currently in Pain?  Yes    Pain Score  6     Pain Location  Arm    Pain Orientation  Left    Pain Descriptors / Indicators  Burning;Tingling    Pain Type  Chronic pain                       [facility] Adult PT Treatment/Exercise - 11/04/18 0001      Ambulation/Gait   Ambulation Distance (Feet)  90 Feet    Assistive device  4-wheeled walker    Gait Pattern  Step-through pattern;Poor foot clearance - right;Poor foot clearance -  left;Trunk flexed;Decreased step length - right;Decreased step length - left;Ataxic;Narrow base of support;Decreased arm swing - right;Decreased arm swing - left;Trendelenburg    Ambulation Surface  Level;Indoor    Gait Comments  gait training with 4WW and 2 AFO- improvement in B toe clearance       Knee/Hip Exercises: Aerobic   Other Aerobic  3 min walk with 4WW around gym track      Knee/Hip Exercises: Standing   Knee Flexion  Right;Left;Strengthening;15 reps    Knee Flexion Limitations  2# HS curl at treamill rail   improvement in extensor thrust   Hip Abduction  Right;Left;Knee straight;15 reps    Abduction Limitations  diagonal glute med kick back with 2# at TM rail    Forward Step Up  Right;Left;1 set;Step Height: 6";Hand Hold: 1    Forward Step Up Limitations  1 UE treadmill rail support; 10x each LE      Vestibular Treatment/Exercise - 11/04/18 0001      Vestibular Treatment/Exercise   Vestibular Treatment Provided  Habituation    Habituation Exercises  Standing Vertical Head Turns      Seated Vertical Head Turns    Number of Reps   20    Symptom Description   sitting EC head turns up/down   4-5/10 dizziness     Standing Horizontal Head Turns   Number of Reps   20    Symptom Description   standing EC head turns up/down with hands hovering over walker handles   3/10 dizziness            PT Education - 11/04/18 1153    Education Details  update to HEP; advised patient to F/U with cardiologist and provider that prescribed medicine for chest pain d/t c/o chest flutter    Person(s) Educated  Patient    Methods  Explanation;Demonstration;Tactile cues;Verbal cues;Handout    Comprehension  Returned demonstration;Verbalized understanding       PT Short Term Goals - 10/28/18 1113      PT SHORT TERM GOAL #1   Title  Patient to be independent with initial HEP.    Time  1    Period  Weeks    Status  Achieved    Target Date  05/28/18        PT Long Term Goals - 10/28/18 1113      PT LONG TERM GOAL #1   Title  Patient to be independent with advanced HEP.    Time  4    Period  Weeks    Status  Partially Met   met for current   Target Date  11/25/18      PT LONG TERM GOAL #2   Title  Patient to score B LE strength atleast 3+/5.     Time  6    Period  Weeks    Status  Achieved      PT LONG TERM GOAL #3   Title  Patient to improve TUG score by 10 sec in order to make progress towards decreased risk of falls.    Baseline  05/21/18 TUG 25.7 sec    Time  3    Period  Weeks    Status  Achieved   06/11/18 15.5 sec      PT LONG TERM GOAL #4   Title  Patient to improve 5xSTS score by 10 sec in order to make progress towards decreased risk of falls.    Baseline  05/21/18 5xSTS 28.9 sec with B UE  support    Time  6    Period  Weeks    Status  Achieved   09/09/18: 18.62 sec     PT LONG TERM GOAL #5   Title  Patient to report tolerance of standing/walking for 15 min before pain or fatigue limit her.     Time  4    Period  Weeks    Status  Partially Met   10 min at this time- able to  stand and wash dishes now   Target Date  11/25/18      Additional Long Term Goals   Additional Long Term Goals  Yes      PT LONG TERM GOAL #6   Title  Patient to demonstrate 0 deg of B ankle AROM to decrease risk of falls.     Time  3    Period  Weeks    Status  Achieved      PT LONG TERM GOAL #7   Title  Patient to score <14 sec on TUG with LRAD to decrease risk of falls.     Time  6    Period  Weeks    Status  Achieved   12.33 sec with 7YP     PT LONG TERM GOAL #8   Title  Patient to demonstrate 10 deg of B ankle AROM to decrease risk of falls.     Time  6    Period  Weeks    Status  Achieved      PT LONG TERM GOAL  #9   TITLE  Patient to report <2/10 dizziness and mild sway with standing horizontal and vertical head turns while gaze fixed on target.     Time  4    Period  Weeks    Status  Partially Met   report of 5/10 dizziness with vertical head turns with chair support   Target Date  11/25/18      PT LONG TERM GOAL  #10   TITLE  Patient to score atleast 4/5 in B LE strength.    Time  6    Period  Weeks    Status  Achieved   showing improvements in R knee extension, L knee flexion and extension, B ankle dorsiflexion and plantar flexion strength     PT LONG TERM GOAL  #11   TITLE  Patient to score atleast 4+/5 in B LE strength.    Time  4    Period  Weeks    Status  New    Target Date  11/25/18      PT LONG TERM GOAL  #12   TITLE  Patient to report <=1/10 dizziness and good stability with bending forward to pick something off the ground.    Time  4    Period  Weeks    Status  New    Target Date  11/25/18            Plan - 11/04/18 1155    Clinical Impression Statement  Patient arrived to session with B AFOs donned. Patient demonstrating improvement in B toe clearance with addition of AFOs with gait training today. Also showing improvement in extensor thrust and knee stability with standing LE strengthening exercises. Patient requiring sitting rest  breaks in between exercises today d/t fatigue. Patient with report of fluttering in her chest during middle of the session- vitals WFL. Patient reporting that she was told in the ED that this was a muscle spasm, and ran  out of the meds that help with this. Encouraged patient to F/U with the provider that prescribed these to her. Patient agreeable. Worked on sitting and standing vertical head turn habituation with patient reporting mild-moderate dizziness. Updated HEP with sitting habituation- patient reported understanding and with no further complaints at end of session.    Comorbidities  stroke, peripheral neuropathy, multiple myeloma, HTN, hiatal hernia, TEE with cardioversion    Rehab Potential  Good    PT Frequency  1x / week    PT Duration  4 weeks    PT Treatment/Interventions  ADLs/Self Care Home Management;Cryotherapy;Electrical Stimulation;Functional mobility training;Stair training;Gait training;DME Instruction;Moist Heat;Therapeutic activities;Therapeutic exercise;Balance training;Neuromuscular re-education;Patient/family education;Passive range of motion;Manual techniques;Dry needling;Energy conservation;Splinting;Taping;Orthotic Fit/Training;Vasopneumatic Device;Vestibular    PT Next Visit Plan  continue challenging standing balance & gait, work on gaze stabilization and habituation    Consulted and Agree with Plan of Care  Patient       Patient will benefit from skilled therapeutic intervention in order to improve the following deficits and impairments:  Abnormal gait, Decreased endurance, Decreased activity tolerance, Decreased strength, Pain, Decreased balance, Decreased mobility, Difficulty walking, Increased muscle spasms, Improper body mechanics, Decreased range of motion, Decreased safety awareness, Impaired flexibility, Postural dysfunction, Dizziness  Visit Diagnosis: 1. Paralytic gait   2. Difficulty in walking, not elsewhere classified   3. Muscle weakness (generalized)   4.  Unsteadiness on feet   5. Dizziness and giddiness   6. Other symptoms and signs involving the musculoskeletal system        Problem List Patient Active Problem List   Diagnosis Date Noted  . Oral thrush 10/08/2018  . Bilateral leg pain 10/08/2018  . Constipation 10/08/2018  . Shortness of breath 10/08/2018  . 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 B12 deficiency 12/19/2016  . Chest pain 12/18/2016  . Cerebellar infarct ([place])   . Diastolic dysfunction   . Marijuana abuse   . History of tobacco abuse   . Benign essential HTN   . Tachycardia   . Hypokalemia   . Stroke (cerebrum) ([place]) 12/05/2016  . Nonintractable headache 12/05/2016  . Occipital stroke ([place]) 12/05/2016  . Hypertensive urgency 12/05/2016  . Hypertension      Janene Harvey, PT, DPT 11/04/18 11:58 AM   [facility] [address]  [place], [place], [address] Phone: [phone]   Fax:  [phone]  Name: Brittany [name] MRN: [id number] Date of Birth: [birth year]

## 2018-11-05 ENCOUNTER — Ambulatory Visit
Admission: RE | Admit: 2018-11-05 | Discharge: 2018-11-05 | Disposition: A | Discharge status: Home / Self Care | Payer: Medicaid Other | Source: Ambulatory Visit | Attending: Adult Health | Admitting: Adult Health

## 2018-11-05 DIAGNOSIS — M5412 Radiculopathy, cervical region: Secondary | ICD-10-CM

## 2018-11-06 IMAGING — CT CT HEAD W/O CM
3 series · 15 of 47 positions shown, 18 images · non-contrast
Comparison: None.

CLINICAL DATA: Vertigo. Hypertension. History of multiple myeloma.

EXAM:
CT HEAD WITHOUT CONTRAST
TECHNIQUE: Contiguous axial images were obtained from the base of the skull
through the vertex without intravenous contrast.

[Series 2: head wo · axial · 0.41mm/px · z∈[+954,+1084]mm · 9 of 32 slices shown, 12 images]
[im 3/32  brain]
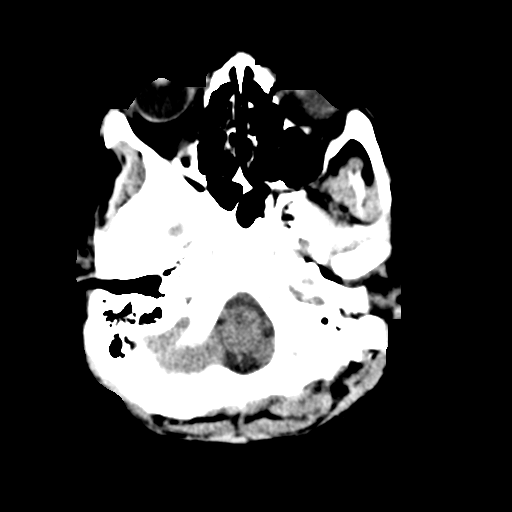
[im 3/32  bone]
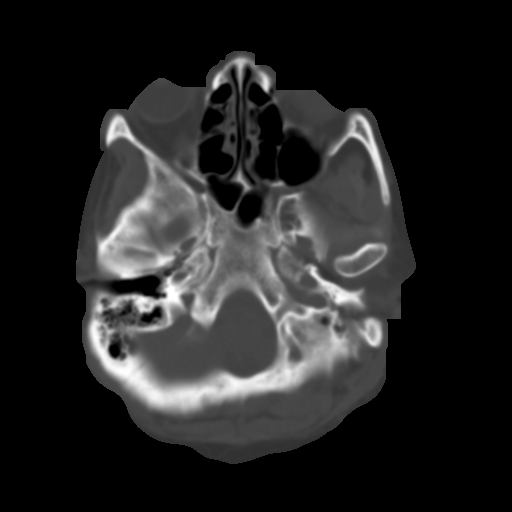
[im 6/32  brain]
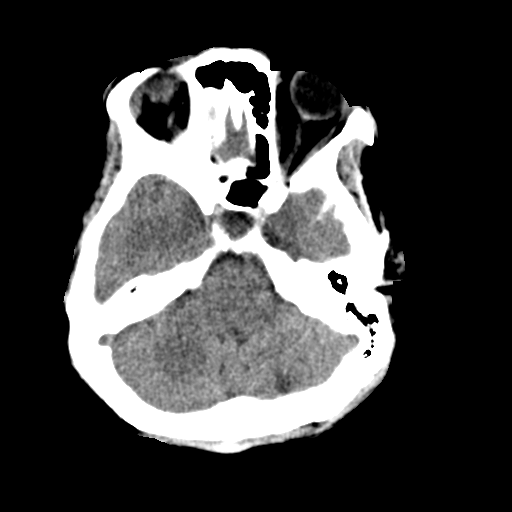
[im 9/32  brain]
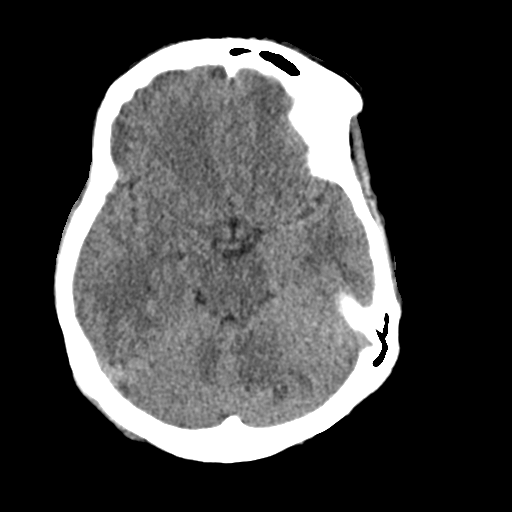
[im 12/32  brain]
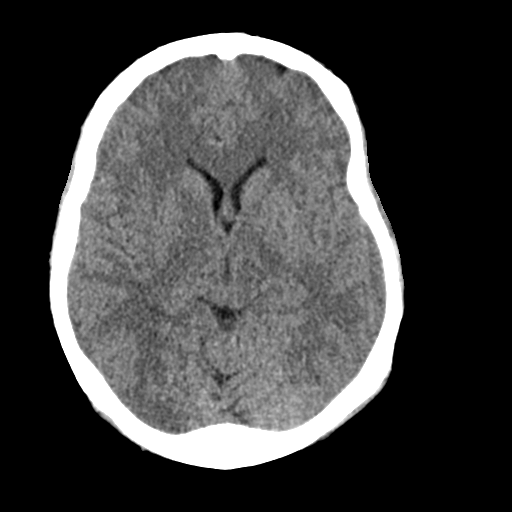
[im 17/32  brain]
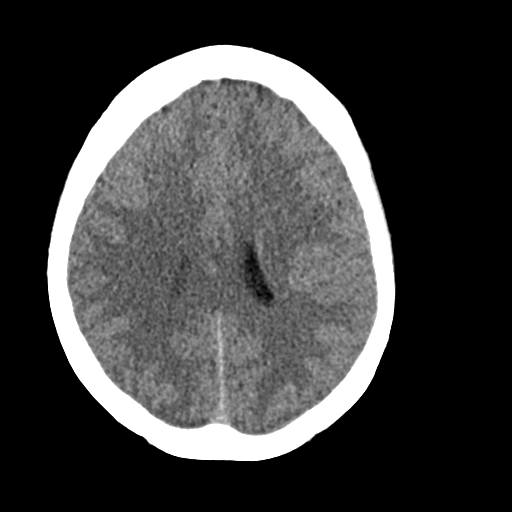
[im 17/32  bone]
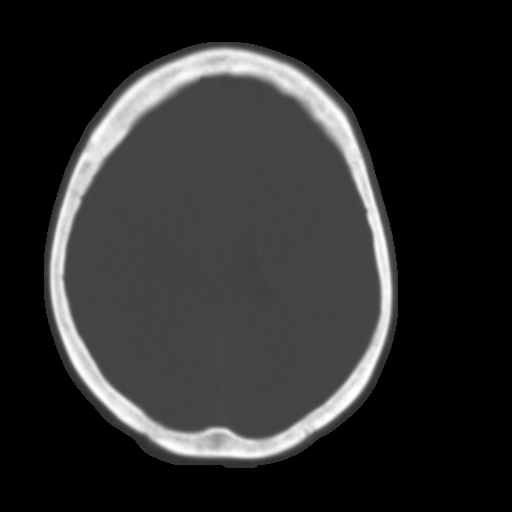
[im 20/32  brain]
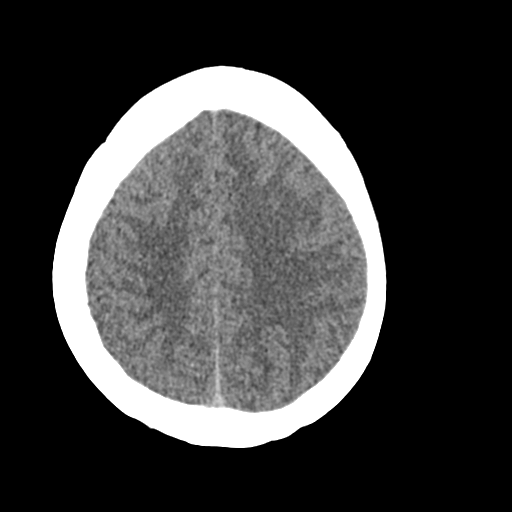
[im 23/32  brain]
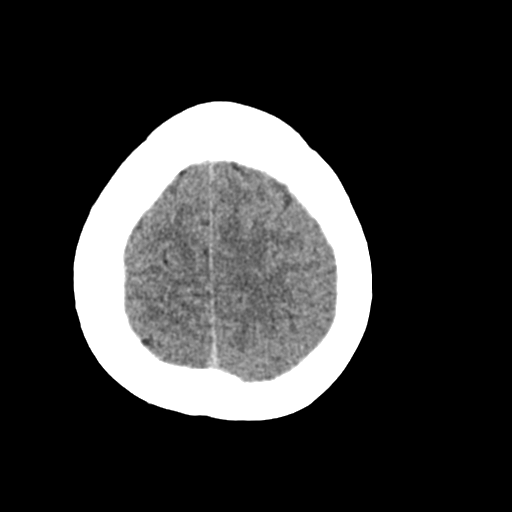
[im 26/32  brain]
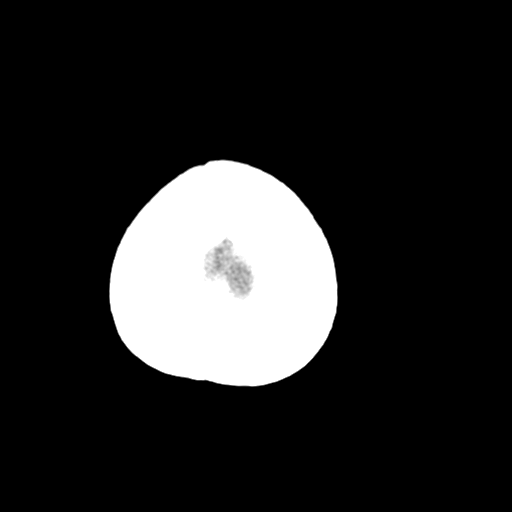
[im 29/32  brain]
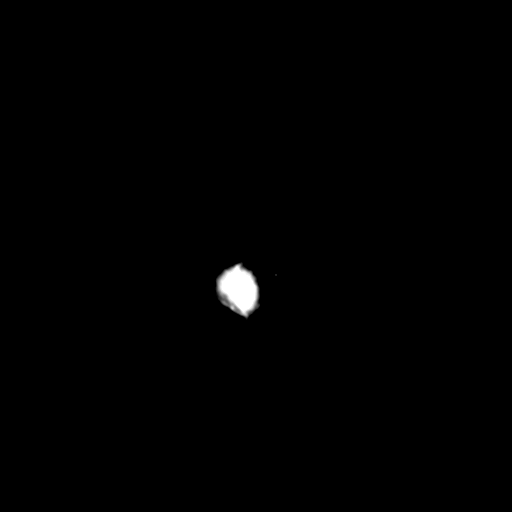
[im 29/32  bone]
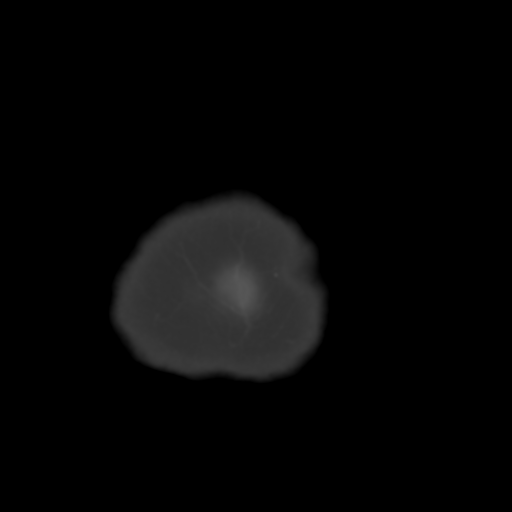

[Series 4: coronal soft · coronal · 0.31mm/px · 3 of 67 slices shown]
[im 23/67  brain]
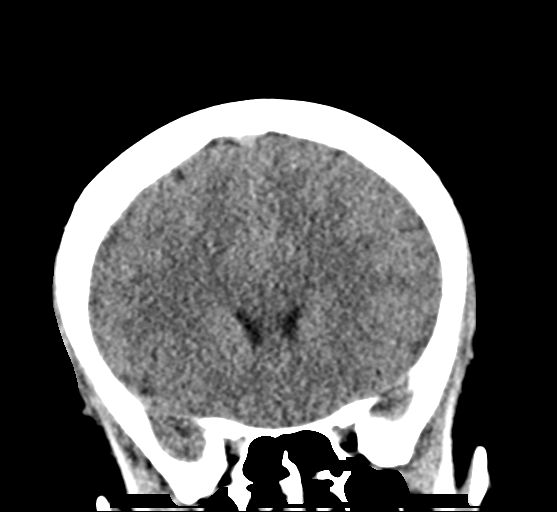
[im 30/67  brain]
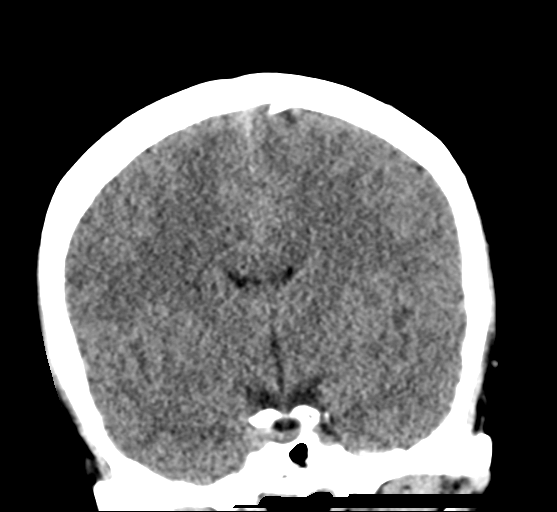
[im 37/67  brain]
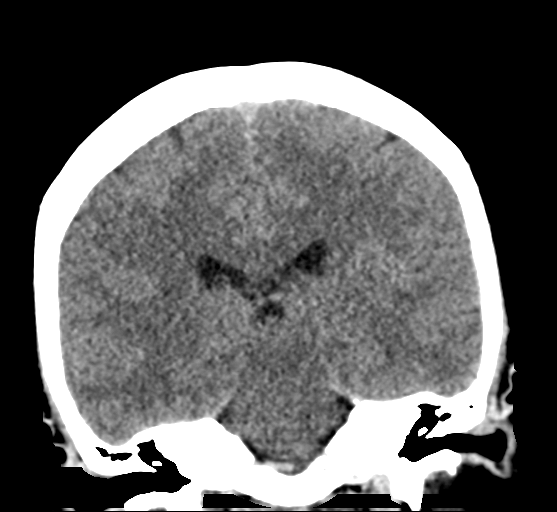

[Series 5: sag soft · sagittal · 0.31mm/px · 3 of 57 slices shown]
[im 19/57  brain]
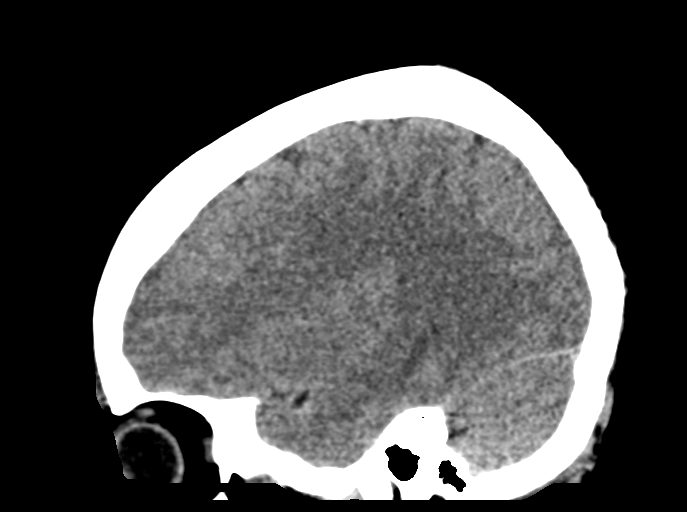
[im 29/57  brain]
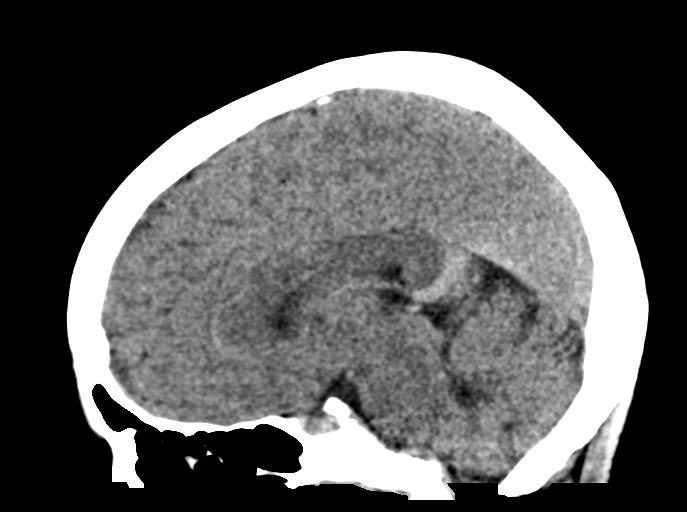
[im 38/57  brain]
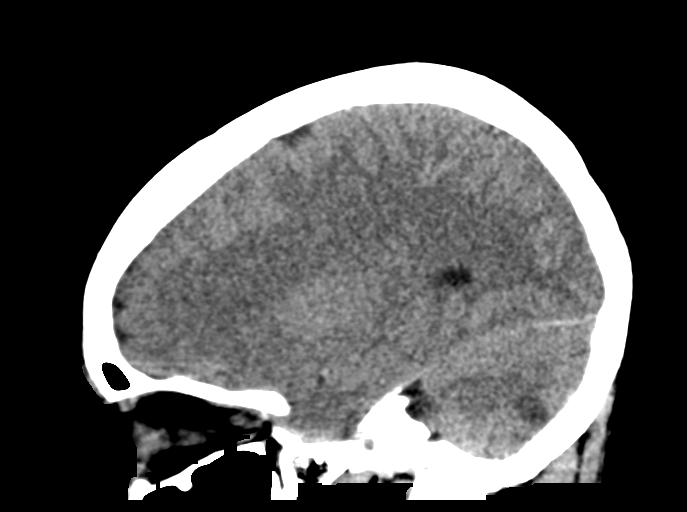

[15 of 47 positions shown; findings below may reference images not displayed]

FINDINGS: Brain: The ventricles are normal in size and configuration. There is
no demonstrable intracranial mass, hemorrhage, extra-axial fluid
collection, or midline shift.

There is decreased attenuation in the medial superior right
occipital lobe. This appearance is concerning for recent infarct in
this area. There is evidence of an age uncertain infarct in the
posterior mid left cerebellum. Elsewhere gray-white compartments
appear normal.

Vascular: There is no appreciable hyperdense vessel. There is no
appreciable vascular calcification.

Skull: The bony calvarium appears intact.

Sinuses/Orbits: There is mucosal thickening in several ethmoid air
cells. Other visualized paranasal sinuses clear. Visualized orbits
appear symmetric bilaterally.

Other: Mastoid air cells are clear.
IMPRESSION: 1. Suspect recent/ acute infarct in the superior, medial right
occipital lobe. This finding warrants MRI with diffusion imaging to
further evaluate.

2.  Age uncertain infarct posterior mid left cerebellum.

3. Gray-white compartments elsewhere appear unremarkable. No mass,
hemorrhage, or extra-axial fluid collection.

4.  Mucosal thickening in several ethmoid air cells.

## 2018-11-11 ENCOUNTER — Ambulatory Visit: Payer: Medicaid Other

## 2018-11-11 ENCOUNTER — Other Ambulatory Visit: Payer: Self-pay | Admitting: Adult Health

## 2018-11-11 ENCOUNTER — Other Ambulatory Visit: Payer: Self-pay

## 2018-11-11 ENCOUNTER — Telehealth: Payer: Self-pay

## 2018-11-11 VITALS — BP 138/84 | HR 87

## 2018-11-11 DIAGNOSIS — M6281 Muscle weakness (generalized): Secondary | ICD-10-CM

## 2018-11-11 DIAGNOSIS — R262 Difficulty in walking, not elsewhere classified: Secondary | ICD-10-CM

## 2018-11-11 DIAGNOSIS — R42 Dizziness and giddiness: Secondary | ICD-10-CM | POA: Diagnosis not present

## 2018-11-11 DIAGNOSIS — R2681 Unsteadiness on feet: Secondary | ICD-10-CM | POA: Diagnosis not present

## 2018-11-11 DIAGNOSIS — R261 Paralytic gait: Secondary | ICD-10-CM

## 2018-11-11 DIAGNOSIS — R29898 Other symptoms and signs involving the musculoskeletal system: Secondary | ICD-10-CM

## 2018-11-11 DIAGNOSIS — M5093 Cervical disc disorder, unspecified, cervicothoracic region: Secondary | ICD-10-CM

## 2018-11-11 NOTE — Telephone Encounter (Signed)
Lvm asking pt to call back to review results. [facility] # was provided.

## 2018-11-11 NOTE — Therapy (Signed)
[facility] [address]  [place], [place], [address] Phone: [phone]   Fax:  [phone]  Physical Therapy Treatment  Patient Details  Name: Brittany [name] MRN: [id number] Date of Birth: 01/23/1966 Referring Provider (PT): Kathe Becton, FNP   Encounter Date: 11/11/2018  PT End of Session - 11/11/18 1113    Visit Number  21    Number of Visits  23    Date for PT Re-Evaluation  11/25/18    Authorization Type  Medicaid    Authorization Time Period  4 visits from 08/17 - 09/13    Authorization - Visit Number  2    Authorization - Number of Visits  4    PT Start Time  1101    PT Stop Time  1144    PT Time Calculation (min)  43 min    Activity Tolerance  Patient tolerated treatment well    Behavior During Therapy  [facility] for tasks assessed/performed       Past Medical History:  Diagnosis Date  . Anemia   . Arthritis    trigger finger in left hand  . Complication of anesthesia    per pt, hard to wake up!  . Elevated cholesterol   . Female bladder prolapse    per urologist, does not have prolaspe  . GERD (gastroesophageal reflux disease)   . Heart murmur    pt unsure.  . Hiatal hernia   . Hypertension   . IBS (irritable bowel syndrome)   . Multiple myeloma ([place]) 2005   had partial chemo  . Peripheral neuropathy   . SOB (shortness of breath) on exertion    uses an inhaler  . Stroke [facility]) 2017   paralysis left arm/uses a walker    Past Surgical History:  Procedure Laterality Date  . BONE BIOPSY  2005   in her back  . COLONOSCOPY     over 10 years x3  . ESOPHAGOGASTRODUODENOSCOPY     incomplete-over 10 years ago   . TEE WITHOUT CARDIOVERSION N/A 12/08/2016   Procedure: TRANSESOPHAGEAL ECHOCARDIOGRAM (TEE);  Surgeon: Sueanne Margarita, MD;  Location: [facility] ENDOSCOPY;  Service: Cardiovascular;  Laterality: N/A;  . TUBAL LIGATION    . UPPER GASTROINTESTINAL ENDOSCOPY    . WISDOM TOOTH EXTRACTION       Vitals:   11/11/18 1110  BP: 138/84  Pulse: 87    Subjective Assessment - 11/11/18 1110    Subjective  Pt. doing well today.    Pertinent History  stroke, peripheral neuropathy, multiple myeloma, HTN, hiatal hernia, TEE with cardioversion    Diagnostic tests  none recent    Patient Stated Goals  work on leg strength    Currently in Pain?  Yes    Pain Score  6     Pain Location  Arm    Pain Orientation  Left    Pain Descriptors / Indicators  Burning;Tingling    Pain Type  Chronic pain    Multiple Pain Sites  No                       [facility] Adult PT Treatment/Exercise - 11/11/18 0001      Neuro Re-ed    Neuro Re-ed Details   Side stepping with 2 HH assistance 4 x 12 ft       Knee/Hip Exercises: Stretches   Piriformis Stretch  Right;Left;30 seconds;1 rep    Piriformis Stretch Limitations  KTOS  Knee/Hip Exercises: Aerobic   Nustep  L4 x 6 min (UE/LEs)      Knee/Hip Exercises: Standing   Heel Raises  Both;15 reps    Hip Abduction  Right;Left;10 reps;Knee straight;Stengthening    Abduction Limitations  red looped TB at ankles; 2 HH assist     Hip Extension  Right;Left;10 reps;Knee straight    Extension Limitations  red looped TB at ankles; 2 HH assist     Forward Step Up  Right;Left;10 reps;Hand Hold: 1;Step Height: 6"    Forward Step Up Limitations  chair      Knee/Hip Exercises: Seated   Sit to Sand  10 reps;1 set;without UE support   without UE pushoff     Knee/Hip Exercises: Supine   Bridges  15 reps;Both    Other Supine Knee/Hip Exercises  deadbug x12 each UE/LE               PT Short Term Goals - 10/28/18 1113      PT SHORT TERM GOAL #1   Title  Patient to be independent with initial HEP.    Time  1    Period  Weeks    Status  Achieved    Target Date  05/28/18        PT Long Term Goals - 10/28/18 1113      PT LONG TERM GOAL #1   Title  Patient to be independent with advanced HEP.    Time  4    Period  Weeks     Status  Partially Met   met for current   Target Date  11/25/18      PT LONG TERM GOAL #2   Title  Patient to score B LE strength atleast 3+/5.     Time  6    Period  Weeks    Status  Achieved      PT LONG TERM GOAL #3   Title  Patient to improve TUG score by 10 sec in order to make progress towards decreased risk of falls.    Baseline  05/21/18 TUG 25.7 sec    Time  3    Period  Weeks    Status  Achieved   06/11/18 15.5 sec      PT LONG TERM GOAL #4   Title  Patient to improve 5xSTS score by 10 sec in order to make progress towards decreased risk of falls.    Baseline  05/21/18 5xSTS 28.9 sec with B UE support    Time  6    Period  Weeks    Status  Achieved   09/09/18: 18.62 sec     PT LONG TERM GOAL #5   Title  Patient to report tolerance of standing/walking for 15 min before pain or fatigue limit her.     Time  4    Period  Weeks    Status  Partially Met   10 min at this time- able to stand and wash dishes now   Target Date  11/25/18      Additional Long Term Goals   Additional Long Term Goals  Yes      PT LONG TERM GOAL #6   Title  Patient to demonstrate 0 deg of B ankle AROM to decrease risk of falls.     Time  3    Period  Weeks    Status  Achieved      PT LONG TERM GOAL #7   Title  Patient to  score <14 sec on TUG with LRAD to decrease risk of falls.     Time  6    Period  Weeks    Status  Achieved   12.33 sec with 2AS     PT LONG TERM GOAL #8   Title  Patient to demonstrate 10 deg of B ankle AROM to decrease risk of falls.     Time  6    Period  Weeks    Status  Achieved      PT LONG TERM GOAL  #9   TITLE  Patient to report <2/10 dizziness and mild sway with standing horizontal and vertical head turns while gaze fixed on target.     Time  4    Period  Weeks    Status  Partially Met   report of 5/10 dizziness with vertical head turns with chair support   Target Date  11/25/18      PT LONG TERM GOAL  #10   TITLE  Patient to score atleast 4/5 in B  LE strength.    Time  6    Period  Weeks    Status  Achieved   showing improvements in R knee extension, L knee flexion and extension, B ankle dorsiflexion and plantar flexion strength     PT LONG TERM GOAL  #11   TITLE  Patient to score atleast 4+/5 in B LE strength.    Time  4    Period  Weeks    Status  New    Target Date  11/25/18      PT LONG TERM GOAL  #12   TITLE  Patient to report <=1/10 dizziness and good stability with bending forward to pick something off the ground.    Time  4    Period  Weeks    Status  New    Target Date  11/25/18            Plan - 11/11/18 1113    Clinical Impression Statement  Pt. denies need for HEP update at she feels HEP is still challanging.  Session focused on mild progression of LE strengthening focused on proximal hip strenthening and standing balance with reduction in UE assistance.  Pt. tolerated all activities well today and able to perform without pain.  Ended session with LE fatigue.  Will continue to progress toward goals.    Personal Factors and Comorbidities  Comorbidity 3+;Social Background;Time since onset of injury/illness/exacerbation;Finances    Comorbidities  stroke, peripheral neuropathy, multiple myeloma, HTN, hiatal hernia, TEE with cardioversion    Rehab Potential  Good    PT Treatment/Interventions  ADLs/Self Care Home Management;Cryotherapy;Electrical Stimulation;Functional mobility training;Stair training;Gait training;DME Instruction;Moist Heat;Therapeutic activities;Therapeutic exercise;Balance training;Neuromuscular re-education;Patient/family education;Passive range of motion;Manual techniques;Dry needling;Energy conservation;Splinting;Taping;Orthotic Fit/Training;Vasopneumatic Device;Vestibular    PT Next Visit Plan  continue challenging standing balance & gait, work on gaze stabilization and habituation    Consulted and Agree with Plan of Care  Patient       Patient will benefit from skilled therapeutic  intervention in order to improve the following deficits and impairments:  Abnormal gait, Decreased endurance, Decreased activity tolerance, Decreased strength, Pain, Decreased balance, Decreased mobility, Difficulty walking, Increased muscle spasms, Improper body mechanics, Decreased range of motion, Decreased safety awareness, Impaired flexibility, Postural dysfunction, Dizziness  Visit Diagnosis: Paralytic gait  Difficulty in walking, not elsewhere classified  Muscle weakness (generalized)  Unsteadiness on feet  Dizziness and giddiness  Other symptoms and signs involving the musculoskeletal system  Problem List Patient Active Problem List   Diagnosis Date Noted  . Oral thrush 10/08/2018  . Bilateral leg pain 10/08/2018  . Constipation 10/08/2018  . Shortness of breath 10/08/2018  . 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 B12 deficiency 12/19/2016  . Chest pain 12/18/2016  . Cerebellar infarct ([place])   . Diastolic dysfunction   . Marijuana abuse   . History of tobacco abuse   . Benign essential HTN   . Tachycardia   . Hypokalemia   . Stroke (cerebrum) ([place]) 12/05/2016  . Nonintractable headache 12/05/2016  . Occipital stroke ([place]) 12/05/2016  . Hypertensive urgency 12/05/2016  . Hypertension     Bess Harvest, [place] 11/11/18 12:13 PM   [facility] [address]  [place], [place], [address] Phone: [phone]   Fax:  [phone]  Name: Brittany [name] MRN: [id number] Date of Birth: 07-21-1965

## 2018-11-11 NOTE — Telephone Encounter (Signed)
Pt returned my call and I was able to advise on results. Pt verbalized understanding and will speak with PCP about orthopedic referral.

## 2018-11-11 NOTE — Progress Notes (Signed)
Referral placed to orthopedics for further evaluation with possible intervention regarding ongoing cervical neck pain with radiculopathy and recent MRI showing multiple levels of cervical disc bulging and foraminal stenosis.

## 2018-11-11 NOTE — Telephone Encounter (Signed)
-----   Message from [name], NP sent at 11/11/2018 11:13 AM EDT ----- Please advise patient that her recent imaging did show bulging disks and narrowing within her cervical spine.  I recommend evaluation by orthopedics for further management.  Referral will be placed.

## 2018-11-18 ENCOUNTER — Encounter: Payer: Self-pay | Admitting: Physical Therapy

## 2018-11-18 ENCOUNTER — Other Ambulatory Visit: Payer: Self-pay

## 2018-11-18 ENCOUNTER — Ambulatory Visit: Payer: Medicaid Other | Admitting: Physical Therapy

## 2018-11-18 DIAGNOSIS — R42 Dizziness and giddiness: Secondary | ICD-10-CM

## 2018-11-18 DIAGNOSIS — M6281 Muscle weakness (generalized): Secondary | ICD-10-CM | POA: Diagnosis not present

## 2018-11-18 DIAGNOSIS — R2681 Unsteadiness on feet: Secondary | ICD-10-CM

## 2018-11-18 DIAGNOSIS — R261 Paralytic gait: Secondary | ICD-10-CM

## 2018-11-18 DIAGNOSIS — R29898 Other symptoms and signs involving the musculoskeletal system: Secondary | ICD-10-CM

## 2018-11-18 DIAGNOSIS — R262 Difficulty in walking, not elsewhere classified: Secondary | ICD-10-CM | POA: Diagnosis not present

## 2018-11-18 NOTE — Therapy (Signed)
[facility] [address]  [place], [place], [address] Phone: [phone]   Fax:  [phone]  Physical Therapy Treatment  Patient Details  Name: Brittany [name] MRN: [id number] Date of Birth: [birth year] Referring Provider (PT): Kathe Becton, FNP   Encounter Date: 11/18/2018  PT End of Session - 11/18/18 1155    Visit Number  22    Number of Visits  23    Date for PT Re-Evaluation  11/25/18    Authorization Type  Medicaid    Authorization Time Period  4 visits from 08/17 - 09/13    Authorization - Visit Number  3    Authorization - Number of Visits  4    PT Start Time  1937    PT Stop Time  1058    PT Time Calculation (min)  40 min    Equipment Utilized During Treatment  Gait belt    Activity Tolerance  Patient tolerated treatment well    Behavior During Therapy  [facility] for tasks assessed/performed       Past Medical History:  Diagnosis Date  . Anemia   . Arthritis    trigger finger in left hand  . Complication of anesthesia    per pt, hard to wake up!  . Elevated cholesterol   . Female bladder prolapse    per urologist, does not have prolaspe  . GERD (gastroesophageal reflux disease)   . Heart murmur    pt unsure.  . Hiatal hernia   . Hypertension   . IBS (irritable bowel syndrome)   . Multiple myeloma ([place]) 2005   had partial chemo  . Peripheral neuropathy   . SOB (shortness of breath) on exertion    uses an inhaler  . Stroke [facility]) 2017   paralysis left arm/uses a walker    Past Surgical History:  Procedure Laterality Date  . BONE BIOPSY  2005   in her back  . COLONOSCOPY     over 10 years x3  . ESOPHAGOGASTRODUODENOSCOPY     incomplete-over 10 years ago   . TEE WITHOUT CARDIOVERSION N/A 12/08/2016   Procedure: TRANSESOPHAGEAL ECHOCARDIOGRAM (TEE);  Surgeon: Sueanne Margarita, MD;  Location: [facility] ENDOSCOPY;  Service: Cardiovascular;  Laterality: N/A;  . TUBAL LIGATION    . UPPER  GASTROINTESTINAL ENDOSCOPY    . WISDOM TOOTH EXTRACTION      There were no vitals filed for this visit.  Subjective Assessment - 11/18/18 1021    Subjective  Rpeorts that she had an MRI of her neck done which showed bulging discs. Has appointments with ortho and neuro this month.    Pertinent History  stroke, peripheral neuropathy, multiple myeloma, HTN, hiatal hernia, TEE with cardioversion    Currently in Pain?  Yes    Pain Score  7     Pain Location  Finger (Comment which one)   L 5th digit   Pain Orientation  Left    Pain Descriptors / Indicators  Aching;Sore    Pain Type  Acute pain                       [facility] Adult PT Treatment/Exercise - 11/18/18 0001      Neuro Re-ed    Neuro Re-ed Details   walking along counter picking up cones to/from floor; reaching to pick up bean bag + throwing into squares of agility ;ladder      Knee/Hip Exercises: Aerobic   Nustep  L5 x 6 min (UE/LEs)      Knee/Hip Exercises: Standing   Knee Flexion  Right;Left;Strengthening;1 set;10 reps    Knee Flexion Limitations  4# HS curl at counter top   difficulty     Knee/Hip Exercises: Seated   Sit to Sand  1 set;15 reps;with UE support   STS on airex with gaze stabilization and CGA     Vestibular Treatment/Exercise - 11/18/18 0001      Vestibular Treatment/Exercise   Habituation Exercises  Comment   anterior sitting habituation x3 each side      Standing Vertical Head Turns   Number of Reps   10    Symptom Description   standing VOR with CGA   slow             PT Short Term Goals - 10/28/18 1113      PT SHORT TERM GOAL #1   Title  Patient to be independent with initial HEP.    Time  1    Period  Weeks    Status  Achieved    Target Date  05/28/18        PT Long Term Goals - 10/28/18 1113      PT LONG TERM GOAL #1   Title  Patient to be independent with advanced HEP.    Time  4    Period  Weeks    Status  Partially Met   met for current   Target Date   11/25/18      PT LONG TERM GOAL #2   Title  Patient to score B LE strength atleast 3+/5.     Time  6    Period  Weeks    Status  Achieved      PT LONG TERM GOAL #3   Title  Patient to improve TUG score by 10 sec in order to make progress towards decreased risk of falls.    Baseline  05/21/18 TUG 25.7 sec    Time  3    Period  Weeks    Status  Achieved   06/11/18 15.5 sec      PT LONG TERM GOAL #4   Title  Patient to improve 5xSTS score by 10 sec in order to make progress towards decreased risk of falls.    Baseline  05/21/18 5xSTS 28.9 sec with B UE support    Time  6    Period  Weeks    Status  Achieved   09/09/18: 18.62 sec     PT LONG TERM GOAL #5   Title  Patient to report tolerance of standing/walking for 15 min before pain or fatigue limit her.     Time  4    Period  Weeks    Status  Partially Met   10 min at this time- able to stand and wash dishes now   Target Date  11/25/18      Additional Long Term Goals   Additional Long Term Goals  Yes      PT LONG TERM GOAL #6   Title  Patient to demonstrate 0 deg of B ankle AROM to decrease risk of falls.     Time  3    Period  Weeks    Status  Achieved      PT LONG TERM GOAL #7   Title  Patient to score <14 sec on TUG with LRAD to decrease risk of falls.     Time  6  Period  Weeks    Status  Achieved   12.33 sec with 4WW     PT LONG TERM GOAL #8   Title  Patient to demonstrate 10 deg of B ankle AROM to decrease risk of falls.     Time  6    Period  Weeks    Status  Achieved      PT LONG TERM GOAL  #9   TITLE  Patient to report <2/10 dizziness and mild sway with standing horizontal and vertical head turns while gaze fixed on target.     Time  4    Period  Weeks    Status  Partially Met   report of 5/10 dizziness with vertical head turns with chair support   Target Date  11/25/18      PT LONG TERM GOAL  #10   TITLE  Patient to score atleast 4/5 in B LE strength.    Time  6    Period  Weeks    Status   Achieved   showing improvements in R knee extension, L knee flexion and extension, B ankle dorsiflexion and plantar flexion strength     PT LONG TERM GOAL  #11   TITLE  Patient to score atleast 4+/5 in B LE strength.    Time  4    Period  Weeks    Status  New    Target Date  11/25/18      PT LONG TERM GOAL  #12   TITLE  Patient to report <=1/10 dizziness and good stability with bending forward to pick something off the ground.    Time  4    Period  Weeks    Status  New    Target Date  11/25/18            Plan - 11/18/18 1158    Clinical Impression Statement  Patient arrived to session with report that her recent cervical MRI revealed bulging discs- notes that she believes her UE N/T is caused by this. Worked on with habituation and VOR challenges d/t patient's remaining sensitivity to vertical position changes. Patient demonstrated mild instability with walking and picking up cones, and still reporting mild dizziness/visual distortion when coming up to stand. Continued to work on anterior habituation with beanbag toss activity- patient requiring wide BOS to maintain balance but did not require PT assistance to recover balance with forward bending. Patient overall has progressed enormously and is agreeable for D/C next session.    Personal Factors and Comorbidities  Comorbidity 3+;Social Background;Time since onset of injury/illness/exacerbation;Finances    Comorbidities  stroke, peripheral neuropathy, multiple myeloma, HTN, hiatal hernia, TEE with cardioversion    Rehab Potential  Good    PT Treatment/Interventions  ADLs/Self Care Home Management;Cryotherapy;Electrical Stimulation;Functional mobility training;Stair training;Gait training;DME Instruction;Moist Heat;Therapeutic activities;Therapeutic exercise;Balance training;Neuromuscular re-education;Patient/family education;Passive range of motion;Manual techniques;Dry needling;Energy conservation;Splinting;Taping;Orthotic  Fit/Training;Vasopneumatic Device;Vestibular    PT Next Visit Plan  continue challenging standing balance & gait, work on gaze stabilization and habituation    Consulted and Agree with Plan of Care  Patient       Patient will benefit from skilled therapeutic intervention in order to improve the following deficits and impairments:  Abnormal gait, Decreased endurance, Decreased activity tolerance, Decreased strength, Pain, Decreased balance, Decreased mobility, Difficulty walking, Increased muscle spasms, Improper body mechanics, Decreased range of motion, Decreased safety awareness, Impaired flexibility, Postural dysfunction, Dizziness  Visit Diagnosis: Paralytic gait  Difficulty in walking, not elsewhere classified  Muscle weakness (  generalized)  Unsteadiness on feet  Dizziness and giddiness  Other symptoms and signs involving the musculoskeletal system     Problem List Patient Active Problem List   Diagnosis Date Noted  . Oral thrush 10/08/2018  . Bilateral leg pain 10/08/2018  . Constipation 10/08/2018  . Shortness of breath 10/08/2018  . 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 B12 deficiency 12/19/2016  . Chest pain 12/18/2016  . Cerebellar infarct ([place])   . Diastolic dysfunction   . Marijuana abuse   . History of tobacco abuse   . Benign essential HTN   . Tachycardia   . Hypokalemia   . Stroke (cerebrum) ([place]) 12/05/2016  . Nonintractable headache 12/05/2016  . Occipital stroke ([place]) 12/05/2016  . Hypertensive urgency 12/05/2016  . Hypertension     Janene Harvey, PT, DPT 11/18/18 11:59 AM   [facility] [address]  [place], [place], [address] Phone: [phone]   Fax:  [phone]  Name: Liset Mcmonigle MRN: [id number] Date of Birth: 1965-11-19

## 2018-11-19 ENCOUNTER — Ambulatory Visit (INDEPENDENT_AMBULATORY_CARE_PROVIDER_SITE_OTHER): Payer: Medicaid Other | Admitting: Adult Health

## 2018-11-19 ENCOUNTER — Encounter: Payer: Self-pay | Admitting: Adult Health

## 2018-11-19 ENCOUNTER — Other Ambulatory Visit: Payer: Self-pay

## 2018-11-19 VITALS — BP 127/84 | HR 96 | Temp 97.1°F | Ht 61.0 in | Wt 167.0 lb

## 2018-11-19 DIAGNOSIS — Z8673 Personal history of transient ischemic attack (TIA), and cerebral infarction without residual deficits: Secondary | ICD-10-CM | POA: Diagnosis not present

## 2018-11-19 DIAGNOSIS — M5412 Radiculopathy, cervical region: Secondary | ICD-10-CM

## 2018-11-19 DIAGNOSIS — G603 Idiopathic progressive neuropathy: Secondary | ICD-10-CM | POA: Diagnosis not present

## 2018-11-19 MED ORDER — GABAPENTIN 300 MG PO CAPS: 300.0000 mg | ORAL_CAPSULE | Freq: Three times a day (TID) | ORAL | 3 refills | Status: DC

## 2018-11-19 MED ORDER — METHOCARBAMOL 500 MG PO TABS: 500.0000 mg | ORAL_TABLET | Freq: Three times a day (TID) | ORAL | 2 refills | Status: DC

## 2018-11-19 MED ORDER — VENLAFAXINE HCL ER 37.5 MG PO CP24: 75.0000 mg | ORAL_CAPSULE | Freq: Every day | ORAL | 3 refills | Status: DC

## 2018-11-19 NOTE — Progress Notes (Signed)
I agree with the above plan 

## 2018-11-19 NOTE — Progress Notes (Signed)
[facility] [address] [place]. [place] [address] [phone]       FOLLOW UP NOTE  Ms. Kalisi Bevill Date of Birth:  02-16-1966 Medical Record Number:  [id number]   Referring MD:  Kathe Becton, NP Reason for Referral/visit:  neuropathy   HPI:   Update 11/19/2018: Ms. Driggs is being seen today for ongoing neuropathy management along with past history of left PICA stroke in 11/2016.  Lyrica previously initiated with mild improvement therefore recommended increasing dose.  Due to left arm radiculopathy symptoms, MR cervical spine obtained on 11/05/2018 which showed multiple levels of disc bulging and moderate to severe foraminal stenosis.  She was referred to orthopedics for further evaluation and management with appointment scheduled in September.  She does continue to have numbness/tingling and increasing weakness compared to baseline left-sided weakness post stroke.  She states occasionally her hand will just give out on her and dropped object that she is holding.  She has continued on Lyrica 75 mg 3 times daily with initial mild benefit for the first 2 weeks but then symptoms returned.  She believes she had better benefit with use of gabapentin and requests restart of gabapentin.  She was unable to tolerate amitriptyline and duloxetine due to anticholinergic side effects.  She remains on Robaxin.  She continues to ambulate with a Rollator walker and is requesting use of a smaller walker as she has difficulty maneuvering current walker at her house.  She is also requesting shower bench.  She continues to work with physical therapy for ongoing gait difficulties.  Neuropathy pain managed by Dr. Posey Pronto at physical medicine rehab but due to COVID-19, she has not had recent follow-up visit.  She has been stable from a stroke standpoint with continuation of aspirin 325 mg daily and atorvastatin 40 mg daily without side effects.  Blood pressure today satisfactory at 127/84.  No  further concerns at this time.    Update virtual visit 08/05/2018: Ms Tasso is a 53 year pleasant African-American lady who was initially scheduled today for follow-up visit regarding neuropathy but due to COVID-19 safety precautions, visit transition to telemedicine via [email or web address] with patient's consent.   She endorses significant paresthesias and discomfort in her feet, hands bilaterally and left arm > right arm which has been present for greater than 10 years.  She did undergo EMG which did not show evidence of peripheral neuropathy but did show mild bilateral carpal tunnel syndrome. Patient was admitted to [facility] in September 2018 with dizziness and was found to have left posterior inferior cerebellar artery infarct which was evidenced on MRI. Initial visit on 01/23/2018 with Dr. Leonie Man and recommended initiating Topamax and continue gabapentin which she was previously on.  Neuropathy panel and EMG unremarkable except for mild bilateral carpal tunnel syndrome.  She unfortunately was unable to tolerate Topamax due to memory side effects and therefore recommended initiating Lyrica at prior office visit on 04/29/2018 with discontinuing gabapentin.  At today's visit, she does endorse mild benefit with use of Lyrica but does continue to experience paresthesias.  She does endorse occasional subjective weakness of left hand where she will frequently drop items.  Left arm pain has been worsening where she feels a burning and numbness sensation from her shoulder down to her fingertips with left-sided neck, trapezius and shoulder pain.  This pain has been present "for as long as I can remember" but has recently only been worsening.    She does endorse previously trying physical therapy for  this neck pain but did not gain much benefit.  She unfortunately has not used wrist splints as recommended for carpal tunnel syndrome as she is currently on disability and unable to afford them.  She declined interest in  release procedure at this time.She also endorses left>right bilateral lower extremity swelling and occasional left wrist/hand swelling.  She continues to work with her PCP in regards to the swelling and is currently participating in therapy but has not gained much benefit.    ROS:   14 system review of systems is positive for dizziness, numbness, weakness, joint pain, joint swelling, back pain, ecchymosis, muscle cramps, neck pain, neck stiffness, moles, itching, frequency of urination, abdominal pain, constipation, shortness of breath, chest pain, leg swelling, double vision, blurred vision, fatigue and excessive sweating and all other systems negative  PMH:  Past Medical History:  Diagnosis Date   Anemia    Arthritis    trigger finger in left hand   Complication of anesthesia    per pt, hard to wake up!   Elevated cholesterol    Female bladder prolapse    per urologist, does not have prolaspe   GERD (gastroesophageal reflux disease)    Heart murmur    pt unsure.   Hiatal hernia    Hypertension    IBS (irritable bowel syndrome)    Multiple myeloma ([place]) 2005   had partial chemo   Peripheral neuropathy    SOB (shortness of breath) on exertion    uses an inhaler   Stroke ([place]) 2017   paralysis left arm/uses a walker    Social History:  Social History   Socioeconomic History   Marital status: Legally Separated    Spouse name: Not on file   Number of children: Not on file   Years of education: Not on file   Highest education level: Not on file  Occupational History   Occupation: Disabled  Scientist, product/process development strain: Not on file   Food insecurity    Worry: Not on file    Inability: Not on file   Transportation needs    Medical: Not on file    Non-medical: Not on file  Tobacco Use   Smoking status: Former Smoker    Quit date: 12/06/2015    Years since quitting: 2.9   Smokeless tobacco: Never Used  Substance and Sexual Activity     Alcohol use: Not Currently   Drug use: Not Currently    Types: Marijuana    Comment: in the past    Sexual activity: Not on file  Lifestyle   Physical activity    Days per week: Not on file    Minutes per session: Not on file   Stress: Not on file  Relationships   Social connections    Talks on phone: Not on file    Gets together: Not on file    Attends religious service: Not on file    Active member of club or organization: Not on file    Attends meetings of clubs or organizations: Not on file    Relationship status: Not on file   Intimate partner violence    Fear of current or ex partner: Not on file    Emotionally abused: Not on file    Physically abused: Not on file    Forced sexual activity: Not on file  Other Topics Concern   Not on file  Social History Narrative   Not on file    Medications:  Current Outpatient Medications on File Prior to Visit  Medication Sig Dispense Refill   albuterol (VENTOLIN HFA) 108 (90 Base) MCG/ACT inhaler Inhale 2 puffs into the lungs every 4 (four) hours as needed for wheezing or shortness of breath (cough, shortness of breath or wheezing.). 6.7 g 6   amitriptyline (ELAVIL) 10 MG tablet Take 1 tablet (10 mg total) by mouth at bedtime. 30 tablet 1   amLODipine (NORVASC) 10 MG tablet Take 1 tablet (10 mg total) by mouth daily. 30 tablet 2   aspirin 325 MG tablet Take 1 tablet (325 mg total) by mouth daily. 30 tablet 0   atorvastatin (LIPITOR) 40 MG tablet Take 1 tablet (40 mg total) by mouth daily. 30 tablet 3   cyanocobalamin 1000 MCG tablet Take 1 tablet (1,000 mcg total) by mouth daily. 30 tablet 4   diazepam (VALIUM) 5 MG tablet Take 1 tablet (5 mg total) by mouth every 12 (twelve) hours as needed for anxiety or muscle spasms. 10 tablet 0   dimenhyDRINATE (DRAMAMINE) 50 MG tablet Take 50 mg by mouth every 8 (eight) hours as needed for dizziness.     docusate sodium (COLACE) 100 MG capsule Take 200 mg by mouth 2 (two)  times daily.      DULoxetine (CYMBALTA) 30 MG capsule Take 1 capsule (30 mg total) by mouth daily. 30 capsule 1   Ergocalciferol (VITAMIN D2) 10 MCG (400 UNIT) TABS Take 10 mcg by mouth daily. 30 tablet 6   furosemide (LASIX) 20 MG tablet Take 1 tablet (20 mg total) by mouth 2 (two) times daily as needed. 60 tablet 2   hydrOXYzine (VISTARIL) 50 MG capsule Take 1-2 capsules (50-100 mg total) by mouth 3 (three) times daily as needed for itching. 30 capsule 1   methocarbamol (ROBAXIN) 500 MG tablet Take 1 tablet (500 mg total) by mouth 3 (three) times daily. (Patient taking differently: Take 500 mg by mouth as needed. ) 90 tablet 2   Multiple Vitamin (MULTIVITAMIN) capsule Take 4 capsules by mouth 2 (two) times daily. Centrum plus     NIFEdipine (ADALAT CC) 30 MG 24 hr tablet Take 2 tablets (60 mg total) by mouth daily. 60 tablet 6   omeprazole (PRILOSEC) 20 MG capsule Take 1 capsule (20 mg total) by mouth 2 (two) times daily before a meal. 60 capsule 3   polyethylene glycol powder (GLYCOLAX/MIRALAX) powder Take 17 g 2 (two) times daily as needed by mouth. 3350 g 1   Potassium Chloride ER 20 MEQ TBCR Take 20 mEq by mouth daily for 30 days. 30 tablet 3   pregabalin (LYRICA) 75 MG capsule Take 1 capsule (75 mg total) by mouth 3 (three) times daily. 90 capsule 0   senna (SENOKOT) 8.6 MG TABS tablet Take 2 tablets by mouth 2 (two) times daily.     triamcinolone cream (KENALOG) 0.1 % Apply 1 application topically 2 (two) times daily. 454 g 0   No current facility-administered medications on file prior to visit.     Allergies:   Allergies  Allergen Reactions   Naproxen     Vomiting, sweating, abd spasms   Other     States can't take pain meds that end in "cet" or meds that end in "ine" Darvocet/severe vomiting   Darvon [Propoxyphene] Nausea And Vomiting   Hydrocodone Nausea And Vomiting   Lactose Intolerance (Gi)     Bloating, gas, abd pain   Latex Itching and Rash    Oxycodone Nausea And Vomiting  Percocet [Oxycodone-Acetaminophen] Nausea And Vomiting   Topamax [Topiramate]     Memory made her emotional     Today's Vitals   11/19/18 1025  BP: 127/84  Pulse: 96  Temp: (!) 97.1 F (36.2 C)  TempSrc: Oral  Weight: 167 lb (75.8 kg)  Height: _0  (1.549 m)   Body mass index is 31.55 kg/m.  Physical Exam General: well developed, well nourished,  pleasant middle-aged African-American female, seated, in no evident distress Head: head normocephalic and atraumatic.   Neck: supple with no carotid or supraclavicular bruits Cardiovascular: regular rate and rhythm, no murmurs Musculoskeletal: no deformity Skin:  no rash/petichiae Vascular:  Normal pulses all extremities   Neurologic Exam Mental Status: Awake and fully alert. Oriented to place and time. Recent and remote memory intact. Attention span, concentration and fund of knowledge appropriate. Mood and affect appropriate.  Cranial Nerves: Fundoscopic exam reveals sharp disc margins. Pupils equal, briskly reactive to light. Extraocular movements full without nystagmus. Visual fields full to confrontation. Hearing intact. Facial sensation intact. Face, tongue, palate moves normally and symmetrically.  Motor: Normal bulk and tone.  Chronic left hemiparesis from prior stroke with subjective worsening at times due to cervical radiculopathy Sensory.:  Decreased sensation BLE distally and LUE Coordination: Rapid alternating movements normal in all extremities except slightly diminished in left hand. Finger-to-nose and heel-to-shin performed accurately bilaterally. Gait and Station: Arises from chair without difficulty. Stance is normal. Gait demonstrates  broad-based gait with use of Rollator walker Reflexes: 1+ and symmetric. Toes downgoing.    IMAGING: MR CERVICAL SPINE WO CONTRAST 11/05/2018 IMPRESSION:  MRI cervical spine (without) demonstrating: - At C4-5. C5-6: disc bulging and uncovertebral  joint hypertrophy with severe left foraminal stenosis. - At C3-4: disc bulging and uncovertebral joint hypertrophy with moderate biforaminal stenosis.        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  Vascular risk factors of hypertension, hyperlipidemia and patent foramen ovale.  Mild bilateral carpal tunnel syndrome.  Due to complaints of left arm radiculopathy, MR cervical spine obtained which showed multiple levels of disc bulging and moderate to severe foraminal stenosis therefore referred to orthopedics for further management.  Difficulty tolerating duloxetine and amitriptyline due to anticholinergic side effects.     PLAN: -Denies benefit with use of Lyrica and request return to gabapentin.  Lyrica discontinued and order placed for gabapentin 300 mg 3 times daily (previously on 600 mg 3 times daily but advised her that dosage will slowly have to be increased) -Discontinued amitriptyline and duloxetine due to anticholinergic side effects -Initiate venlafaxine 37.5 mg for 7 days and then increase to 75 mg daily for ongoing neuropathic pain -Initial orthopedic evaluation scheduled in September for left arm likely cervical radiculopathy -Recommend rescheduling follow-up visit with Dr. Posey Pronto as she should continue to follow with him for ongoing pain management -this was discussed with patient and verbalized agreement -Continue aspirin and atorvastatin for secondary stroke prevention -Requesting different walker and shower chair -  will follow-up in regards to if this was sent to physical medicine rehab by therapy prior to ordering due to ongoing balance difficulties secondary to neuropathy and residual stroke deficits  Follow-up in 4 months or call earlier if needed  Greater than  50% time during this 25-minute visit was spent on counseling and coordination of care about her neuropathic pain,  potential cervical radiculopathy and ongoing paresthesias.  Discussion also regarding ongoing pain management and secondary stroke prevention.  All questions answered to patient satisfaction.   Frann Rider [facility]), AGNP-BC  [facility] [address] [place], [place] [address]  Phone [phone] Fax [phone] Note: This document was prepared with digital dictation and possible smart phrase technology. Any transcriptional errors that result from this process are unintentional.

## 2018-11-19 NOTE — Patient Instructions (Addendum)
Your Plan:  Recommend initiating Effexor 37.5 mg daily for the first 7 days and then increase to 75 mg (2 tablets) daily after that.  After the first month, please let me know if dosage needs to be increased.  Discontinue Lyrica and restart gabapentin as requested -will start at 300 mg 3 times daily  Please schedule follow-up visit with Dr. Posey Pronto for ongoing management of neuropathic pain     Thank you for coming to see [place] at [facility]. I hope we have been able to provide you high quality care today.  You may receive a patient satisfaction survey over the next few weeks. We would appreciate your feedback and comments so that we may continue to improve ourselves and the health of our patients.    Venlafaxine tablets What is this medicine? VENLAFAXINE (VEN la fax een) is used to treat depression, anxiety and panic disorder. This medicine may be used for other purposes; ask your health care provider or pharmacist if you have questions. COMMON BRAND NAME(S): Effexor What should I tell my health care provider before I take this medicine? They need to know if you have any of these conditions:  bleeding disorders  glaucoma  heart disease  high blood pressure  high cholesterol  kidney disease  liver disease  low levels of sodium in the blood  mania or bipolar disorder  seizures  suicidal thoughts, plans, or attempt; a previous suicide attempt by you or a family  take medicines that treat or prevent blood clots  thyroid disease  an unusual or allergic reaction to venlafaxine, desvenlafaxine, other medicines, foods, dyes, or preservatives  pregnant or trying to get pregnant  breast-feeding How should I use this medicine? Take this medicine by mouth with a glass of water. Follow the directions on the prescription label. Take it with food. Take your medicine at regular intervals. Do not take your medicine more often than directed. Do not stop taking  this medicine suddenly except upon the advice of your doctor. Stopping this medicine too quickly may cause serious side effects or your condition may worsen. A special MedGuide will be given to you by the pharmacist with each prescription and refill. Be sure to read this information carefully each time. Talk to your pediatrician regarding the use of this medicine in children. Special care may be needed. Overdosage: If you think you have taken too much of this medicine contact a poison control center or emergency room at once. NOTE: This medicine is only for you. Do not share this medicine with others. What if I miss a dose? If you miss a dose, take it as soon as you can. If it is almost time for your next dose, take only that dose. Do not take double or extra doses. What may interact with this medicine? Do not take this medicine with any of the following medications:  certain medicines for fungal infections like fluconazole, itraconazole, ketoconazole, posaconazole, voriconazole  cisapride  desvenlafaxine  dronedarone  duloxetine  levomilnacipran  linezolid  MAOIs like Carbex, Eldepryl, Marplan, Nardil, and Parnate  methylene blue (injected into a vein)  milnacipran  pimozide  thioridazine This medicine may also interact with the following medications:  amphetamines  aspirin and aspirin-like medicines  certain medicines for depression, anxiety, or psychotic disturbances  certain medicines for migraine headaches like almotriptan, eletriptan, frovatriptan, naratriptan, rizatriptan, sumatriptan, zolmitriptan  certain medicines for sleep  certain medicines that treat or prevent blood clots like dalteparin, enoxaparin, warfarin  cimetidine  clozapine  diuretics  fentanyl  furazolidone  indinavir  isoniazid  lithium  metoprolol  NSAIDS, medicines for pain and inflammation, like ibuprofen or naproxen  other medicines that prolong the QT interval (cause an  abnormal heart rhythm) like dofetilide, ziprasidone  procarbazine  rasagiline  supplements like St. John's wort, kava kava, valerian  tramadol  tryptophan This list may not describe all possible interactions. Give your health care provider a list of all the medicines, herbs, non-prescription drugs, or dietary supplements you use. Also tell them if you smoke, drink alcohol, or use illegal drugs. Some items may interact with your medicine. What should I watch for while using this medicine? Tell your doctor if your symptoms do not get better or if they get worse. Visit your doctor or health care professional for regular checks on your progress. Because it may take several weeks to see the full effects of this medicine, it is important to continue your treatment as prescribed by your doctor. Patients and their families should watch out for new or worsening thoughts of suicide or depression. Also watch out for sudden changes in feelings such as feeling anxious, agitated, panicky, irritable, hostile, aggressive, impulsive, severely restless, overly excited and hyperactive, or not being able to sleep. If this happens, especially at the beginning of treatment or after a change in dose, call your health care professional. This medicine can cause an increase in blood pressure. Check with your doctor for instructions on monitoring your blood pressure while taking this medicine. You may get drowsy or dizzy. Do not drive, use machinery, or do anything that needs mental alertness until you know how this medicine affects you. Do not stand or sit up quickly, especially if you are an older patient. This reduces the risk of dizzy or fainting spells. Alcohol may interfere with the effect of this medicine. Avoid alcoholic drinks. Your mouth may get dry. Chewing sugarless gum, sucking hard candy and drinking plenty of water will help. Contact your doctor if the problem does not go away or is severe. What side effects  may I notice from receiving this medicine? Side effects that you should report to your doctor or health care professional as soon as possible:  allergic reactions like skin rash, itching or hives, swelling of the face, lips, or tongue  anxious  breathing problems  confusion  changes in vision  chest pain  confusion  elevated mood, decreased need for sleep, racing thoughts, impulsive behavior  eye pain  fast, irregular heartbeat  feeling faint or lightheaded, falls  feeling agitated, angry, or irritable  hallucination, loss of contact with reality  high blood pressure  loss of balance or coordination  palpitations  redness, blistering, peeling or loosening of the skin, including inside the mouth  restlessness, pacing, inability to keep still  seizures  stiff muscles  suicidal thoughts or other mood changes  trouble passing urine or change in the amount of urine  trouble sleeping  unusual bleeding or bruising  unusually weak or tired  vomiting Side effects that usually do not require medical attention (report to your doctor or health care professional if they continue or are bothersome):  change in sex drive or performance  change in appetite or weight  constipation  dizziness  dry mouth  headache  increased sweating  nausea  tired This list may not describe all possible side effects. Call your doctor for medical advice about side effects. You may report side effects to FDA at 1-800-FDA-1088. Where should I  keep my medicine? Keep out of the reach of children. Store at a controlled temperature between 20 and 25 degrees C (68 and 77 degrees F), in a dry place. Throw away any unused medicine after the expiration date. NOTE: This sheet is a summary. It may not cover all possible information. If you have questions about this medicine, talk to your doctor, pharmacist, or health care provider.  2020 Elsevier/Gold Standard (2018-02-26 12:08:23)

## 2018-11-21 ENCOUNTER — Ambulatory Visit: Payer: Medicaid Other | Admitting: Adult Health

## 2018-11-26 ENCOUNTER — Ambulatory Visit: Payer: Medicaid Other | Attending: Family Medicine | Admitting: Physical Therapy

## 2018-11-26 ENCOUNTER — Encounter: Payer: Self-pay | Admitting: Physical Therapy

## 2018-11-26 ENCOUNTER — Other Ambulatory Visit: Payer: Self-pay

## 2018-11-26 DIAGNOSIS — R2681 Unsteadiness on feet: Secondary | ICD-10-CM | POA: Insufficient documentation

## 2018-11-26 DIAGNOSIS — M6281 Muscle weakness (generalized): Secondary | ICD-10-CM | POA: Diagnosis present

## 2018-11-26 DIAGNOSIS — R262 Difficulty in walking, not elsewhere classified: Secondary | ICD-10-CM | POA: Diagnosis present

## 2018-11-26 DIAGNOSIS — R42 Dizziness and giddiness: Secondary | ICD-10-CM | POA: Diagnosis present

## 2018-11-26 DIAGNOSIS — R261 Paralytic gait: Secondary | ICD-10-CM

## 2018-11-26 DIAGNOSIS — R29898 Other symptoms and signs involving the musculoskeletal system: Secondary | ICD-10-CM | POA: Insufficient documentation

## 2018-11-26 NOTE — Therapy (Signed)
[facility] [address]  [place], [place], [address] Phone: [phone]   Fax:  [phone]  Physical Therapy Discharge Summary  Patient Details  Name: Brittany [name] MRN: [id number] Date of Birth: 12-01-1965 Referring Provider (PT): Kathe Becton, FNP   Encounter Date: 11/26/2018  PT End of Session - 11/26/18 1150    Visit Number  23    Number of Visits  23    Date for PT Re-Evaluation  11/25/18    Authorization Type  Medicaid    Authorization Time Period  4 visits from 08/17 - 09/13    Authorization - Visit Number  4    Authorization - Number of Visits  4    PT Start Time  1104    PT Stop Time  1145    PT Time Calculation (min)  41 min    Equipment Utilized During Treatment  Gait belt    Activity Tolerance  Patient tolerated treatment well    Behavior During Therapy  [facility] for tasks assessed/performed       Past Medical History:  Diagnosis Date  . Anemia   . Arthritis    trigger finger in left hand  . Complication of anesthesia    per pt, hard to wake up!  . Elevated cholesterol   . Female bladder prolapse    per urologist, does not have prolaspe  . GERD (gastroesophageal reflux disease)   . Heart murmur    pt unsure.  . Hiatal hernia   . Hypertension   . IBS (irritable bowel syndrome)   . Multiple myeloma ([place]) 2005   had partial chemo  . Peripheral neuropathy   . SOB (shortness of breath) on exertion    uses an inhaler  . Stroke [facility]) 2017   paralysis left arm/uses a walker    Past Surgical History:  Procedure Laterality Date  . BONE BIOPSY  2005   in her back  . COLONOSCOPY     over 10 years x3  . ESOPHAGOGASTRODUODENOSCOPY     incomplete-over 10 years ago   . TEE WITHOUT CARDIOVERSION N/A 12/08/2016   Procedure: TRANSESOPHAGEAL ECHOCARDIOGRAM (TEE);  Surgeon: Sueanne Margarita, MD;  Location: [facility] ENDOSCOPY;  Service: Cardiovascular;  Laterality: N/A;  . TUBAL LIGATION    . UPPER  GASTROINTESTINAL ENDOSCOPY    . WISDOM TOOTH EXTRACTION      There were no vitals filed for this visit.  Subjective Assessment - 11/26/18 1105    Subjective  Has been feeling pretty good since last session. Reports 80% improvement since starting PT. Feels like she still needs to work on her balance. Feels ready to wrap up with PT- "It's bittersweet."    Pertinent History  stroke, peripheral neuropathy, multiple myeloma, HTN, hiatal hernia, TEE with cardioversion    Diagnostic tests  none recent    Patient Stated Goals  work on leg strength    Currently in Pain?  Yes    Pain Location  Elbow    Pain Orientation  Left    Pain Descriptors / Indicators  Burning    Pain Type  Chronic pain    Pain Radiating Towards  from elbow to hand         [facility] PT Assessment - 11/26/18 0001      Strength   Right Hip Flexion  4+/5    Right Hip ABduction  4+/5    Right Hip ADduction  4/5    Left Hip Flexion  4+/5    Left Hip ABduction  4/5    Left Hip ADduction  4/5    Right Knee Flexion  4+/5    Right Knee Extension  5/5    Left Knee Flexion  4+/5    Left Knee Extension  4+/5    Right Ankle Dorsiflexion  4+/5    Right Ankle Plantar Flexion  4+/5   15 reps   Left Ankle Dorsiflexion  4+/5    Left Ankle Plantar Flexion  4/5   13 reps with heavy UE reliance                  [facility] Adult PT Treatment/Exercise - 11/26/18 0001      Neuro Re-ed    Neuro Re-ed Details   walking with CGA picking up 5 beanbags from the ground    5-6/10 dizziness     Knee/Hip Exercises: Aerobic   Nustep  L5 x 6 min (UE/LEs)      Vestibular Treatment/Exercise - 11/26/18 0001      Standing Horizontal Head Turns   Number of Reps   20    Symptom Description   standing horizontal VOR with rollator   5/10 dizziness     Standing Vertical Head Turns   Number of Reps   20    Symptom Description   standing VOR with rollator   4/10 dizziness           PT Education - 11/26/18 1149    Education  Details  update/consolidate of HEP; discussion on objective progress with PT; edu on benefits of continuing a fitness regimen or walking program    Person(s) Educated  Patient    Methods  Explanation;Demonstration;Tactile cues;Verbal cues;Handout    Comprehension  Verbalized understanding;Returned demonstration       PT Short Term Goals - 11/26/18 1108      PT SHORT TERM GOAL #1   Title  Patient to be independent with initial HEP.    Time  1    Period  Weeks    Status  Achieved    Target Date  05/28/18        PT Long Term Goals - 11/26/18 1108      PT LONG TERM GOAL #1   Title  Patient to be independent with advanced HEP.    Time  4    Period  Weeks    Status  Achieved      PT LONG TERM GOAL #2   Title  Patient to score B LE strength atleast 3+/5.     Time  6    Period  Weeks    Status  Achieved      PT LONG TERM GOAL #3   Title  Patient to improve TUG score by 10 sec in order to make progress towards decreased risk of falls.    Baseline  05/21/18 TUG 25.7 sec    Time  3    Period  Weeks    Status  Achieved   06/11/18 15.5 sec      PT LONG TERM GOAL #4   Title  Patient to improve 5xSTS score by 10 sec in order to make progress towards decreased risk of falls.    Baseline  05/21/18 5xSTS 28.9 sec with B UE support    Time  6    Period  Weeks    Status  Achieved   09/09/18: 18.62 sec     PT LONG TERM GOAL #5   Title  Patient to report tolerance of standing/walking for 15 min before pain or fatigue limit her.     Time  4    Period  Weeks    Status  Partially Met   reports 10-15 min     PT LONG TERM GOAL #6   Title  Patient to demonstrate 0 deg of B ankle AROM to decrease risk of falls.     Time  3    Period  Weeks    Status  Achieved      PT LONG TERM GOAL #7   Title  Patient to score <14 sec on TUG with LRAD to decrease risk of falls.     Time  6    Period  Weeks    Status  Achieved   12.33 sec with 9FX     PT LONG TERM GOAL #8   Title  Patient to  demonstrate 10 deg of B ankle AROM to decrease risk of falls.     Time  6    Period  Weeks    Status  Achieved      PT LONG TERM GOAL  #9   TITLE  Patient to report <2/10 dizziness and mild sway with standing horizontal and vertical head turns while gaze fixed on target.     Time  4    Period  Weeks    Status  Partially Met   report of 5/10 dizziness with vertical head turns with chair support     PT LONG TERM GOAL  #10   TITLE  Patient to score atleast 4/5 in B LE strength.    Time  6    Period  Weeks    Status  Achieved   showing improvements in R knee extension, L knee flexion and extension, B ankle dorsiflexion and plantar flexion strength     PT LONG TERM GOAL  #11   TITLE  Patient to score atleast 4+/5 in B LE strength.    Time  4    Period  Weeks    Status  Partially Met   improved in R hip abduction, B knee flexion, R knee extension, and R plantarflexion strength     PT LONG TERM GOAL  #12   TITLE  Patient to report <=1/10 dizziness and good stability with bending forward to pick something off the ground.    Time  4    Period  Weeks    Status  Not Met   reporting 5-6/10 dizziness and instability when picking up objects from the floor without UE support           Plan - 11/26/18 1153    Clinical Impression Statement  Patient reporting 80% improvement in functional abilities since starting PT. Still feels like she needs to work on her balance, but is now able to tolerate ADLs like washing dishes and gardening, which she was not able to do before PT. Patient reports 10-15 min of standing/walking before onset of pain or fatigue. Strength testing revealed improvements in R hip abduction, B knee flexion, R knee extension, and R plantarflexion strength. Patient still with 4-6/10 dizziness with standing VOR and bending forward to pick objects of the ground. Overall, patient has met or partially met all goals at this time, with excepting of picking objects from the ground.  Updated HEP with exercises to continue LE strength improvements and educated patient on benefits of continued fitness regimen or walking program. Patient reported understanding and feels ready for discharge  at this time.    Personal Factors and Comorbidities  Comorbidity 3+;Social Background;Time since onset of injury/illness/exacerbation;Finances    Comorbidities  stroke, peripheral neuropathy, multiple myeloma, HTN, hiatal hernia, TEE with cardioversion    Rehab Potential  Good    PT Treatment/Interventions  ADLs/Self Care Home Management;Cryotherapy;Electrical Stimulation;Functional mobility training;Stair training;Gait training;DME Instruction;Moist Heat;Therapeutic activities;Therapeutic exercise;Balance training;Neuromuscular re-education;Patient/family education;Passive range of motion;Manual techniques;Dry needling;Energy conservation;Splinting;Taping;Orthotic Fit/Training;Vasopneumatic Device;Vestibular    PT Next Visit Plan  DC at this time    Consulted and Agree with Plan of Care  Patient       Patient will benefit from skilled therapeutic intervention in order to improve the following deficits and impairments:  Abnormal gait, Decreased endurance, Decreased activity tolerance, Decreased strength, Pain, Decreased balance, Decreased mobility, Difficulty walking, Increased muscle spasms, Improper body mechanics, Decreased range of motion, Decreased safety awareness, Impaired flexibility, Postural dysfunction, Dizziness  Visit Diagnosis: Paralytic gait  Difficulty in walking, not elsewhere classified  Muscle weakness (generalized)  Unsteadiness on feet  Dizziness and giddiness  Other symptoms and signs involving the musculoskeletal system     Problem List Patient Active Problem List   Diagnosis Date Noted  . Oral thrush 10/08/2018  . Bilateral leg pain 10/08/2018  . Constipation 10/08/2018  . Shortness of breath 10/08/2018  . Late effect of cerebrovascular accident (CVA)  09/06/2018  . B12 deficiency 12/19/2016  . Chest pain 12/18/2016  . Cerebellar infarct ([place])   . Diastolic dysfunction   . Marijuana abuse   . History of tobacco abuse   . Benign essential HTN   . Tachycardia   . Hypokalemia   . Stroke (cerebrum) ([place]) 12/05/2016  . Nonintractable headache 12/05/2016  . Occipital stroke ([place]) 12/05/2016  . Hypertensive urgency 12/05/2016  . Hypertension      PHYSICAL THERAPY DISCHARGE SUMMARY  Visits from Start of Care: 23  Current functional level related to goals / functional outcomes: See above clinical impression   Remaining deficits: Decreased LE strength, imbalance and dizziness with head turns and bending forward to pick up object from floor, limited standing tolerance   Education / Equipment: HEP  Plan: Patient agrees to discharge.  Patient goals were partially met. Patient is being discharged due to being pleased with the current functional level.  ?????     Janene Harvey, PT, DPT 11/26/18 11:59 AM   [facility] [address]  [place], [place], [address] Phone: [phone]   Fax:  [phone]  Name: Samone Guhl MRN: [id number] Date of Birth: 11-26-1965

## 2018-11-28 ENCOUNTER — Telehealth: Payer: Self-pay

## 2018-11-29 ENCOUNTER — Other Ambulatory Visit: Payer: Self-pay

## 2018-11-29 MED ORDER — AMLODIPINE BESYLATE 10 MG PO TABS: 10.0000 mg | ORAL_TABLET | Freq: Every day | ORAL | 2 refills | Status: DC

## 2018-11-29 NOTE — Telephone Encounter (Signed)
Sent refill to pharmacy. 

## 2018-12-04 ENCOUNTER — Encounter: Payer: Self-pay | Admitting: Orthopaedic Surgery

## 2018-12-04 ENCOUNTER — Other Ambulatory Visit: Payer: Self-pay

## 2018-12-04 ENCOUNTER — Ambulatory Visit (INDEPENDENT_AMBULATORY_CARE_PROVIDER_SITE_OTHER): Payer: Medicaid Other | Admitting: Orthopaedic Surgery

## 2018-12-04 ENCOUNTER — Ambulatory Visit (INDEPENDENT_AMBULATORY_CARE_PROVIDER_SITE_OTHER): Payer: Medicaid Other

## 2018-12-04 VITALS — BP 133/87 | HR 85 | Ht 61.0 in | Wt 167.0 lb

## 2018-12-04 DIAGNOSIS — I639 Cerebral infarction, unspecified: Secondary | ICD-10-CM

## 2018-12-04 DIAGNOSIS — M542 Cervicalgia: Secondary | ICD-10-CM | POA: Diagnosis not present

## 2018-12-05 NOTE — Progress Notes (Signed)
Office Visit Note   Patient: Brittany [name]           Date of Birth: [birth year]           MRN: [id number] Visit Date: 12/04/2018              Requested by: Frann Rider, NP [phone] [address]  [place] [address] PCP: Azzie Glatter, FNP   Assessment & Plan: Visit Diagnoses:  1. Neck pain   2. Cerebellar infarct ([place])   3. Occipital stroke [facility])     Plan: We reviewed the MRI scan with patient.  She does not have stenosis or cord changes that would suggest cervical surgery will improve her weakness.  Prescription given for narrow walker she may do well she gets a child's walker that she could leave at the bathroom door and switch walkers when she goes in her bathroom which has a narrow opening and a current walker will not fit.  Another alternative was she can put some handicap railing up in the bathroom and use this to get back and forth to the toilet.  Follow-Up Instructions: Return if symptoms worsen or fail to improve.   Orders:  Orders Placed This Encounter  Procedures  . XR Cervical Spine 2 or 3 views   No orders of the defined types were placed in this encounter.     Procedures: No procedures performed   Clinical Data: No additional findings.   Subjective: Chief Complaint  Patient presents with  . Neck - Pain    HPI 53 year old female here with a rolling walker with history of uncontrolled hypertension with stroke and left arm and hand residual weakness.  Her normal rolling walker would not fit into the bathroom.  She is requesting a narrower walker.  Patient had cerebellar infarct noted on MRI scan not visualized on last CT scan.  Cervical MRI scan 11/05/2018 showed some disc bulging at C4-5 C5-6 some foraminal stenosis on the left at C3-4 without significant central stenosis.  Review of Systems positive for CVA related to uncontrolled hypertension.  Cerebellar infarct.  B12 deficiency, occipital stroke.  Left arm weakness secondary to CVA.    Objective: Vital Signs: BP 133/87   Pulse 85   Ht _0  (1.549 m)   Wt 167 lb (75.8 kg)   BMI 31.55 kg/m   Physical Exam Constitutional:      Appearance: She is well-developed.  HENT:     Head: Normocephalic.     Right Ear: External ear normal.     Left Ear: External ear normal.  Eyes:     Pupils: Pupils are equal, round, and reactive to light.  Neck:     Thyroid: No thyromegaly.     Trachea: No tracheal deviation.  Cardiovascular:     Rate and Rhythm: Normal rate.  Pulmonary:     Effort: Pulmonary effort is normal.  Abdominal:     Palpations: Abdomen is soft.  Skin:    General: Skin is warm and dry.  Neurological:     Mental Status: She is alert and oriented to person, place, and time.  Psychiatric:        Behavior: Behavior normal.     Ortho Exam patient uses a rolling walker has difficulty gripping with the left hand mostly sets her hand on top of the rolling walker and grips a walker with her right hand.  She has bilateral AFOs.  Significant difficulty ambulating without the walker.  Specialty Comments:  No  specialty comments available.  Imaging: Xr Cervical Spine 2 Or 3 Views  Result Date: 12/05/2018 AP lateral cervical spine x-rays are obtained negative for acute fracture patient has disc space narrowing and spurring with mid cervical spondylosis.  Comparison MRI 11/07/2018. Impression: Mid cervical spondylosis.    PMFS History: Patient Active Problem List   Diagnosis Date Noted  . Oral thrush 10/08/2018  . Bilateral leg pain 10/08/2018  . Constipation 10/08/2018  . Shortness of breath 10/08/2018  . 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 B12 deficiency 12/19/2016  . Chest pain 12/18/2016  . Cerebellar infarct ([place])   . Diastolic dysfunction   . Marijuana abuse   . History of tobacco abuse   . Benign essential HTN   . Tachycardia   . Hypokalemia   . Stroke (cerebrum) ([place]) 12/05/2016  . Nonintractable headache 12/05/2016  .  Occipital stroke ([place]) 12/05/2016  . Hypertensive urgency 12/05/2016  . Hypertension    Past Medical History:  Diagnosis Date  . Anemia   . Arthritis    trigger finger in left hand  . Complication of anesthesia    per pt, hard to wake up!  . Elevated cholesterol   . Female bladder prolapse    per urologist, does not have prolaspe  . GERD (gastroesophageal reflux disease)   . Heart murmur    pt unsure.  . Hiatal hernia   . Hypertension   . IBS (irritable bowel syndrome)   . Multiple myeloma ([place]) 2005   had partial chemo  . Peripheral neuropathy   . SOB (shortness of breath) on exertion    uses an inhaler  . Stroke [facility]) 2017   paralysis left arm/uses a walker    Family History  Problem Relation Age of Onset  . Hypertension Mother   . Sarcoidosis Mother        currently in remission   . Diverticulitis Mother   . Irritable bowel syndrome Mother   . Liver cancer Mother   . Hypertension Father   . Stomach cancer Father   . Congestive Heart Failure Father   . Stroke Maternal Uncle   . Scoliosis Brother   . Colon cancer Neg Hx   . Esophageal cancer Neg Hx   . Colon polyps Neg Hx   . Rectal cancer Neg Hx     Past Surgical History:  Procedure Laterality Date  . BONE BIOPSY  2005   in her back  . COLONOSCOPY     over 10 years x3  . ESOPHAGOGASTRODUODENOSCOPY     incomplete-over 10 years ago   . TEE WITHOUT CARDIOVERSION N/A 12/08/2016   Procedure: TRANSESOPHAGEAL ECHOCARDIOGRAM (TEE);  Surgeon: Sueanne Margarita, MD;  Location[facility] ENDOSCOPY;  Service: Cardiovascular;  Laterality: N/A;  . TUBAL LIGATION    . UPPER GASTROINTESTINAL ENDOSCOPY    . WISDOM TOOTH EXTRACTION     Social History   Occupational History  . Occupation: Disabled  Tobacco Use  . Smoking status: Former Smoker    Quit date: 12/06/2015    Years since quitting: 3.0  . Smokeless tobacco: Never Used  Substance and Sexual Activity  . Alcohol use: Not Currently  . Drug use: Not Currently     Types: Marijuana    Comment: in the past   . Sexual activity: Not on file

## 2018-12-23 ENCOUNTER — Telehealth: Payer: Self-pay | Admitting: Orthopaedic Surgery

## 2018-12-23 NOTE — Telephone Encounter (Signed)
faxed

## 2018-12-23 NOTE — Telephone Encounter (Signed)
Patient called. Says the RX for her walker needs to say Rollator walker. Fax number for Advance is [phone]

## 2018-12-26 DIAGNOSIS — I639 Cerebral infarction, unspecified: Secondary | ICD-10-CM | POA: Diagnosis not present

## 2019-01-08 ENCOUNTER — Ambulatory Visit: Payer: Medicaid Other | Admitting: Family Medicine

## 2019-01-22 ENCOUNTER — Other Ambulatory Visit: Payer: Self-pay | Admitting: Family Medicine

## 2019-01-22 DIAGNOSIS — R609 Edema, unspecified: Secondary | ICD-10-CM

## 2019-01-22 DIAGNOSIS — G4700 Insomnia, unspecified: Secondary | ICD-10-CM

## 2019-02-04 ENCOUNTER — Ambulatory Visit (INDEPENDENT_AMBULATORY_CARE_PROVIDER_SITE_OTHER): Payer: Medicaid Other | Admitting: Family Medicine

## 2019-02-04 ENCOUNTER — Encounter: Payer: Self-pay | Admitting: Family Medicine

## 2019-02-04 ENCOUNTER — Other Ambulatory Visit: Payer: Self-pay

## 2019-02-04 VITALS — BP 128/62 | Temp 97.8°F | Ht 61.0 in | Wt 171.0 lb

## 2019-02-04 DIAGNOSIS — F419 Anxiety disorder, unspecified: Secondary | ICD-10-CM

## 2019-02-04 DIAGNOSIS — G8929 Other chronic pain: Secondary | ICD-10-CM

## 2019-02-04 DIAGNOSIS — Z1231 Encounter for screening mammogram for malignant neoplasm of breast: Secondary | ICD-10-CM

## 2019-02-04 DIAGNOSIS — I10 Essential (primary) hypertension: Secondary | ICD-10-CM

## 2019-02-04 DIAGNOSIS — Z09 Encounter for follow-up examination after completed treatment for conditions other than malignant neoplasm: Secondary | ICD-10-CM | POA: Diagnosis not present

## 2019-02-04 DIAGNOSIS — N390 Urinary tract infection, site not specified: Secondary | ICD-10-CM | POA: Diagnosis not present

## 2019-02-04 LAB — POCT URINALYSIS DIPSTICK
Bilirubin, UA: NEGATIVE
Blood, UA: NEGATIVE
Glucose, UA: NEGATIVE
Ketones, UA: NEGATIVE
Nitrite, UA: NEGATIVE
Protein, UA: NEGATIVE
Spec Grav, UA: 1.025 (ref 1.010–1.025)
Urobilinogen, UA: 0.2 E.U./dL
pH, UA: 6 (ref 5.0–8.0)

## 2019-02-04 MED ORDER — SULFAMETHOXAZOLE-TRIMETHOPRIM 800-160 MG PO TABS: 1.0000 | ORAL_TABLET | Freq: Two times a day (BID) | ORAL | 0 refills | Status: DC

## 2019-02-04 NOTE — Patient Instructions (Signed)
Sulfamethoxazole; Trimethoprim, SMX-TMP tablets What is this medicine? SULFAMETHOXAZOLE; TRIMETHOPRIM or SMX-TMP (suhl fuh meth OK suh zohl; trye METH oh prim) is a combination of a sulfonamide antibiotic and a second antibiotic, trimethoprim. It is used to treat or prevent certain kinds of bacterial infections. It will not work for colds, flu, or other viral infections. This medicine may be used for other purposes; ask your health care provider or pharmacist if you have questions. COMMON BRAND NAME(S): Bacter-Aid DS, Bactrim, Bactrim DS, Septra, Septra DS What should I tell my health care provider before I take this medicine? They need to know if you have any of these conditions:  anemia  asthma  being treated with anticonvulsants  if you frequently drink alcohol containing drinks  kidney disease  liver disease  low level of folic acid or VELFYBO-1-BPZWCHENI dehydrogenase  poor nutrition or malabsorption  porphyria  severe allergies  thyroid disorder  an unusual or allergic reaction to sulfamethoxazole, trimethoprim, sulfa drugs, other medicines, foods, dyes, or preservatives  pregnant or trying to get pregnant  breast-feeding How should I use this medicine? Take this medicine by mouth with a full glass of water. Follow the directions on the prescription label. Take your medicine at regular intervals. Do not take it more often than directed. Do not skip doses or stop your medicine early. Talk to your pediatrician regarding the use of this medicine in children. Special care may be needed. This medicine has been used in children as young as 92 months of age. Overdosage: If you think you have taken too much of this medicine contact a poison control center or emergency room at once. NOTE: This medicine is only for you. Do not share this medicine with others. What if I miss a dose? If you miss a dose, take it as soon as you can. If it is almost time for your next dose, take only  that dose. Do not take double or extra doses. What may interact with this medicine? Do not take this medicine with any of the following medications:  aminobenzoate potassium  dofetilide  metronidazole This medicine may also interact with the following medications:  ACE inhibitors like benazepril, enalapril, lisinopril, and ramipril  birth control pills  cyclosporine  digoxin  diuretics  indomethacin  medicines for diabetes  methenamine  methotrexate  phenytoin  potassium supplements  pyrimethamine  sulfinpyrazone  tricyclic antidepressants  warfarin This list may not describe all possible interactions. Give your health care provider a list of all the medicines, herbs, non-prescription drugs, or dietary supplements you use. Also tell them if you smoke, drink alcohol, or use illegal drugs. Some items may interact with your medicine. What should I watch for while using this medicine? Tell your doctor or health care professional if your symptoms do not improve. Drink several glasses of water a day to reduce the risk of kidney problems. Do not treat diarrhea with over the counter products. Contact your doctor if you have diarrhea that lasts more than 2 days or if it is severe and watery. This medicine can make you more sensitive to the sun. Keep out of the sun. If you cannot avoid being in the sun, wear protective clothing and use a sunscreen. Do not use sun lamps or tanning beds/booths. What side effects may I notice from receiving this medicine? Side effects that you should report to your doctor or health care professional as soon as possible:  allergic reactions like skin rash or hives, swelling of the face, lips,  or tongue  breathing problems  fever or chills, sore throat  irregular heartbeat, chest pain  joint or muscle pain  pain or difficulty passing urine  red pinpoint spots on skin  redness, blistering, peeling or loosening of the skin, including  inside the mouth  unusual bleeding or bruising  unusually weak or tired  yellowing of the eyes or skin Side effects that usually do not require medical attention (report to your doctor or health care professional if they continue or are bothersome):  diarrhea  dizziness  headache  loss of appetite  nausea, vomiting  nervousness This list may not describe all possible side effects. Call your doctor for medical advice about side effects. You may report side effects to FDA at 1-800-FDA-1088. Where should I keep my medicine? Keep out of the reach of children. Store at room temperature between 20 to 25 degrees C (68 to 77 degrees F). Protect from light. Throw away any unused medicine after the expiration date. NOTE: This sheet is a summary. It may not cover all possible information. If you have questions about this medicine, talk to your doctor, pharmacist, or health care provider.  2020 Elsevier/Gold Standard (2012-10-11 14:38:26) Urinary Tract Infection, Adult A urinary tract infection (UTI) is an infection of any part of the urinary tract. The urinary tract includes:  The kidneys.  The ureters.  The bladder.  The urethra. These organs make, store, and get rid of pee (urine) in the body. What are the causes? This is caused by germs (bacteria) in your genital area. These germs grow and cause swelling (inflammation) of your urinary tract. What increases the risk? You are more likely to develop this condition if:  You have a small, thin tube (catheter) to drain pee.  You cannot control when you pee or poop (incontinence).  You are female, and: ? You use these methods to prevent pregnancy: ? A medicine that kills sperm (spermicide). ? A device that blocks sperm (diaphragm). ? You have low levels of a female hormone (estrogen). ? You are pregnant.  You have genes that add to your risk.  You are sexually active.  You take antibiotic medicines.  You have trouble  peeing because of: ? A prostate that is bigger than normal, if you are female. ? A blockage in the part of your body that drains pee from the bladder (urethra). ? A kidney stone. ? A nerve condition that affects your bladder (neurogenic bladder). ? Not getting enough to drink. ? Not peeing often enough.  You have other conditions, such as: ? Diabetes. ? A weak disease-fighting system (immune system). ? Sickle cell disease. ? Gout. ? Injury of the spine. What are the signs or symptoms? Symptoms of this condition include:  Needing to pee right away (urgently).  Peeing often.  Peeing small amounts often.  Pain or burning when peeing.  Blood in the pee.  Pee that smells bad or not like normal.  Trouble peeing.  Pee that is cloudy.  Fluid coming from the vagina, if you are female.  Pain in the belly or lower back. Other symptoms include:  Throwing up (vomiting).  No urge to eat.  Feeling mixed up (confused).  Being tired and grouchy (irritable).  A fever.  Watery poop (diarrhea). How is this treated? This condition may be treated with:  Antibiotic medicine.  Other medicines.  Drinking enough water. Follow these instructions at home:  Medicines  Take over-the-counter and prescription medicines only as told by your doctor.    If you were prescribed an antibiotic medicine, take it as told by your doctor. Do not stop taking it even if you start to feel better. General instructions  Make sure you: ? Pee until your bladder is empty. ? Do not hold pee for a long time. ? Empty your bladder after sex. ? Wipe from front to back after pooping if you are a female. Use each tissue one time when you wipe.  Drink enough fluid to keep your pee pale yellow.  Keep all follow-up visits as told by your doctor. This is important. Contact a doctor if:  You do not get better after 1-2 days.  Your symptoms go away and then come back. Get help right away if:  You have  very bad back pain.  You have very bad pain in your lower belly.  You have a fever.  You are sick to your stomach (nauseous).  You are throwing up. Summary  A urinary tract infection (UTI) is an infection of any part of the urinary tract.  This condition is caused by germs in your genital area.  There are many risk factors for a UTI. These include having a small, thin tube to drain pee and not being able to control when you pee or poop.  Treatment includes antibiotic medicines for germs.  Drink enough fluid to keep your pee pale yellow. This information is not intended to replace advice given to you by your health care provider. Make sure you discuss any questions you have with your health care provider. Document Released: 08/23/2007 Document Revised: 02/21/2018 Document Reviewed: 09/13/2017 Elsevier Patient Education  2020 Elsevier Inc.  

## 2019-02-04 NOTE — Progress Notes (Signed)
Patient [facility]   Established Patient Office Visit  Subjective:  Patient ID: Brittany [name], female    DOB: 09-01-1965  Age: 53 y.o. MRN: [id number]  CC:  Chief Complaint  Patient presents with   Follow-up    3 month follow up HTN   Pain in Right Shoulder    grinding pain when lifting arm     HPI Brittany [name] is a 53 year old female who presents for Follow Up today.   Past Medical History:  Diagnosis Date   Anemia    Arthritis    trigger finger in left hand   Complication of anesthesia    per pt, hard to wake up!   Elevated cholesterol    Female bladder prolapse    per urologist, does not have prolaspe   GERD (gastroesophageal reflux disease)    Heart murmur    pt unsure.   Hiatal hernia    Hypertension    IBS (irritable bowel syndrome)    Multiple myeloma ([place]) 2005   had partial chemo   Peripheral neuropathy    SOB (shortness of breath) on exertion    uses an inhaler   Stroke ([place]) 2017   paralysis left arm/uses a walker   Current Status: Since her last office visit, she is doing well with no complaints.  She continues to follow up with Orthopedics as needed for She She has also been following up with Neurology and Rehab since her last office visit. She is currently having increased right shoulder pain, which she was recently following up with Pain Clinic, but has since left their office. She denies visual changes, chest pain, cough, shortness of breath, heart palpitations, and falls. She has occasional headaches and dizziness with position changes. Denies severe headaches, confusion, seizures, double vision, and blurred vision, nausea and vomiting. She denies fevers, chills, fatigue, recent infections, weight loss, and night sweats. No reports of GI problems such as nausea, vomiting, diarrhea, and constipation. She has no reports of blood in stools, dysuria and hematuria. He anxiety is moderate today.  She denies suicidal ideations, homicidal ideations, or auditory hallucinations.   Past Surgical History:  Procedure Laterality Date   BONE BIOPSY  2005   in her back   COLONOSCOPY     over 10 years x3   ESOPHAGOGASTRODUODENOSCOPY     incomplete-over 10 years ago    TEE WITHOUT CARDIOVERSION N/A 12/08/2016   Procedure: TRANSESOPHAGEAL ECHOCARDIOGRAM (TEE);  Surgeon: Sueanne Margarita, MD;  Location: [facility] ENDOSCOPY;  Service: Cardiovascular;  Laterality: N/A;   TUBAL LIGATION     UPPER GASTROINTESTINAL ENDOSCOPY     WISDOM TOOTH EXTRACTION      Family History  Problem Relation Age of Onset   Hypertension Mother    Sarcoidosis Mother        currently in remission    Diverticulitis Mother    Irritable bowel syndrome Mother    Liver cancer Mother    Hypertension Father    Stomach cancer Father    Congestive Heart Failure Father    Stroke Maternal Uncle    Scoliosis Brother    Colon cancer Neg Hx    Esophageal cancer Neg Hx    Colon polyps Neg Hx    Rectal cancer Neg Hx     Social History   Socioeconomic History   Marital status: Legally Separated    Spouse name: Not on file   Number of children: Not on file  Years of education: Not on file   Highest education level: Not on file  Occupational History   Occupation: Disabled  Social Needs   Financial resource strain: Not on file   Food insecurity    Worry: Not on file    Inability: Not on file   Transportation needs    Medical: Not on file    Non-medical: Not on file  Tobacco Use   Smoking status: Former Smoker    Quit date: 12/06/2015    Years since quitting: 3.1   Smokeless tobacco: Never Used  Substance and Sexual Activity   Alcohol use: Not Currently   Drug use: Not Currently    Types: Marijuana    Comment: in the past    Sexual activity: Not Currently  Lifestyle   Physical activity    Days per week: Not on file    Minutes per session: Not on file   Stress: Not on file    Relationships   Social connections    Talks on phone: Not on file    Gets together: Not on file    Attends religious service: Not on file    Active member of club or organization: Not on file    Attends meetings of clubs or organizations: Not on file    Relationship status: Not on file   Intimate partner violence    Fear of current or ex partner: Not on file    Emotionally abused: Not on file    Physically abused: Not on file    Forced sexual activity: Not on file  Other Topics Concern   Not on file  Social History Narrative   Not on file    Outpatient Medications Prior to Visit  Medication Sig Dispense Refill   albuterol (VENTOLIN HFA) 108 (90 Base) MCG/ACT inhaler Inhale 2 puffs into the lungs every 4 (four) hours as needed for wheezing or shortness of breath (cough, shortness of breath or wheezing.). 6.7 g 6   amLODipine (NORVASC) 10 MG tablet Take 1 tablet (10 mg total) by mouth daily. 30 tablet 2   aspirin 325 MG tablet Take 1 tablet (325 mg total) by mouth daily. 30 tablet 0   atorvastatin (LIPITOR) 40 MG tablet Take 1 tablet (40 mg total) by mouth daily. 30 tablet 3   cyanocobalamin 1000 MCG tablet Take 1 tablet (1,000 mcg total) by mouth daily. 30 tablet 4   dimenhyDRINATE (DRAMAMINE) 50 MG tablet Take 50 mg by mouth every 8 (eight) hours as needed for dizziness.     docusate sodium (COLACE) 100 MG capsule Take 200 mg by mouth 2 (two) times daily.      Ergocalciferol (VITAMIN D2) 10 MCG (400 UNIT) TABS Take 10 mcg by mouth daily. 30 tablet 6   furosemide (LASIX) 20 MG tablet Take 1 tablet by mouth twice daily as needed 180 tablet 0   gabapentin (NEURONTIN) 300 MG capsule Take 1 capsule (300 mg total) by mouth 3 (three) times daily. 90 capsule 3   hydrOXYzine (VISTARIL) 50 MG capsule Take 1-2 capsules (50-100 mg total) by mouth 3 (three) times daily as needed for itching. 30 capsule 1   methocarbamol (ROBAXIN) 500 MG tablet Take 1 tablet (500 mg total) by  mouth 3 (three) times daily. 90 tablet 2   Multiple Vitamin (MULTIVITAMIN) capsule Take 4 capsules by mouth 2 (two) times daily. Centrum plus     NIFEdipine (ADALAT CC) 30 MG 24 hr tablet Take 2 tablets (60 mg total) by mouth  daily. 60 tablet 6   omeprazole (PRILOSEC) 20 MG capsule Take 1 capsule (20 mg total) by mouth 2 (two) times daily before a meal. 60 capsule 3   senna (SENOKOT) 8.6 MG TABS tablet Take 2 tablets by mouth 2 (two) times daily.     triamcinolone cream (KENALOG) 0.1 % Apply 1 application topically 2 (two) times daily. 454 g 0   diazepam (VALIUM) 5 MG tablet Take 1 tablet (5 mg total) by mouth every 12 (twelve) hours as needed for anxiety or muscle spasms. 10 tablet 0   polyethylene glycol powder (GLYCOLAX/MIRALAX) powder Take 17 g 2 (two) times daily as needed by mouth. 3350 g 1   Potassium Chloride ER 20 MEQ TBCR Take 20 mEq by mouth daily for 30 days. 30 tablet 3   venlafaxine XR (EFFEXOR XR) 37.5 MG 24 hr capsule Take 2 capsules (75 mg total) by mouth daily with breakfast. 60 capsule 3   No facility-administered medications prior to visit.     Allergies  Allergen Reactions   Amitriptyline Other (See Comments)    Patient reported that it made her throat feel like its locking up and it also caused issues with her going to the bathroom   Duloxetine Other (See Comments)    Patient reported that it made her throat lock up and it caused her to have issue with going to the bathroom   Naproxen     Vomiting, sweating, abd spasms   Other     States can't take pain meds that end in "cet" or meds that end in "ine" Darvocet/severe vomiting   Darvon [Propoxyphene] Nausea And Vomiting   Hydrocodone Nausea And Vomiting   Lactose Intolerance (Gi)     Bloating, gas, abd pain   Latex Itching and Rash   Oxycodone Nausea And Vomiting   Percocet [Oxycodone-Acetaminophen] Nausea And Vomiting   Topamax [Topiramate]     Memory made her emotional     ROS Review of  Systems  Constitutional: Positive for fatigue.       Use of Rollator  HENT: Negative.   Eyes: Negative.   Respiratory: Negative.   Cardiovascular: Negative.   Gastrointestinal: Negative.   Endocrine: Negative.   Genitourinary: Negative.   Musculoskeletal: Positive for arthralgias (generalized).  Skin: Negative.   Allergic/Immunologic: Negative.   Neurological: Positive for dizziness (occasional ) and headaches (occasional ).  Hematological: Negative.   Psychiatric/Behavioral: Negative.       Objective:    Physical Exam  Constitutional: She is oriented to person, place, and time. She appears well-developed and well-nourished.  HENT:  Head: Normocephalic and atraumatic.  Eyes: Conjunctivae are normal.  Neck: Normal range of motion. Neck supple.  Cardiovascular: Normal rate, regular rhythm, normal heart sounds and intact distal pulses.  Pulmonary/Chest: Effort normal and breath sounds normal.  Abdominal: Soft. Bowel sounds are normal.  Musculoskeletal:     Comments: Limited ROM in extremities  Neurological: She is alert and oriented to person, place, and time. She has normal reflexes.  Skin: Skin is warm and dry.  Psychiatric: She has a normal mood and affect. Her behavior is normal. Judgment and thought content normal.  Nursing note and vitals reviewed.   BP 128/62    Temp 97.8 F (36.6 C) (Oral)    Ht '5\' 1"'$  (1.549 m)    Wt 171 lb (77.6 kg)    SpO2 98%    BMI 32.31 kg/m  Wt Readings from Last 3 Encounters:  02/04/19 171 lb (77.6 kg)  12/04/18 167 lb (75.8 kg)  11/19/18 167 lb (75.8 kg)     Health Maintenance Due  Topic Date Due   PAP SMEAR-Modifier  11/17/1986   MAMMOGRAM  11/17/2015   INFLUENZA VACCINE  10/19/2018    There are no preventive care reminders to display for this patient.  Lab Results  Component Value Date   TSH 2.690 01/23/2018   Lab Results  Component Value Date   WBC 8.6 10/15/2018   HGB 14.2 10/15/2018   HCT 42.6 10/15/2018   MCV  81.1 10/15/2018   PLT 300 10/15/2018   Lab Results  Component Value Date   NA 133 (L) 10/15/2018   K 4.1 10/15/2018   CO2 18 (L) 10/15/2018   GLUCOSE 139 (H) 10/15/2018   BUN 11 10/15/2018   CREATININE 1.17 (H) 10/15/2018   BILITOT 0.9 10/15/2018   ALKPHOS 169 (H) 10/15/2018   AST 20 10/15/2018   ALT 15 10/15/2018   PROT 8.9 (H) 10/15/2018   ALBUMIN 4.7 10/15/2018   CALCIUM 9.8 10/15/2018   ANIONGAP 15 10/15/2018   Lab Results  Component Value Date   CHOL 209 (H) 08/21/2017   Lab Results  Component Value Date   HDL 44 08/21/2017   Lab Results  Component Value Date   LDLCALC 138 (H) 08/21/2017   Lab Results  Component Value Date   TRIG 133 08/21/2017   Lab Results  Component Value Date   CHOLHDL 4.8 (H) 08/21/2017   Lab Results  Component Value Date   HGBA1C 6.2 (A) 10/08/2018      Assessment & Plan:   1. Hypertension, unspecified type The current medical regimen is effective; blood pressure is stable at 128/62 today; continue present plan and medications as prescribed. He will continue to take medications as prescribed, to decrease high sodium intake, excessive alcohol intake, increase potassium intake, smoking cessation, and increase physical activity of at least 30 minutes of cardio activity daily. He will continue to follow Heart Healthy or DASH diet. - Urinalysis Dipstick - CBC with Differential - Comp Met (CMET) - TSH - Lipid Panel - Vitamin B12 - Vitamin D, 25-hydroxy  2. Other chronic pain - Ambulatory referral to Pain Clinic  3. Urinary tract infection without hematuria, site unspecified We will initiate antibiotic today.  - sulfamethoxazole-trimethoprim (BACTRIM DS) 800-160 MG tablet; Take 1 tablet by mouth 2 (two) times daily.  Dispense: 14 tablet; Refill: 0  4. Encounter for screening mammogram for malignant neoplasm of breast This will be her initial Mammogram.  - MM Digital Diagnostic Bilat; Future  5. Anxiety Mild today.  6. Follow  up She will follow up in 6 months.   Meds ordered this encounter  Medications   sulfamethoxazole-trimethoprim (BACTRIM DS) 800-160 MG tablet    Sig: Take 1 tablet by mouth 2 (two) times daily.    Dispense:  14 tablet    Refill:  0    Orders Placed This Encounter  Procedures   MM Digital Diagnostic Bilat   CBC with Differential   Comp Met (CMET)   TSH   Lipid Panel   Vitamin B12   Vitamin D, 25-hydroxy   Ambulatory referral to Pain Clinic   Urinalysis Dipstick     Referral Orders     Ambulatory referral to [facility],  MSN, FNP-BC [facility] [address] [place], [place] [address] [phone] fax  Problem List Items Addressed This Visit  Cardiovascular and Mediastinum   Hypertension - Primary   Relevant Orders   Urinalysis Dipstick (Completed)   CBC with Differential   Comp Met (CMET)   TSH   Lipid Panel   Vitamin B12   Vitamin D, 25-hydroxy    Other Visit Diagnoses    Other chronic pain       Relevant Orders   Ambulatory referral to Pain Clinic   Urinary tract infection without hematuria, site unspecified       Relevant Medications   sulfamethoxazole-trimethoprim (BACTRIM DS) 800-160 MG tablet   Encounter for screening mammogram for malignant neoplasm of breast       Relevant Orders   MM Digital Diagnostic Bilat   Anxiety       Follow up          Meds ordered this encounter  Medications   sulfamethoxazole-trimethoprim (BACTRIM DS) 800-160 MG tablet    Sig: Take 1 tablet by mouth 2 (two) times daily.    Dispense:  14 tablet    Refill:  0    Follow-up: Return in about 6 months (around 08/04/2019).    Azzie Glatter, FNP

## 2019-02-05 DIAGNOSIS — F419 Anxiety disorder, unspecified: Secondary | ICD-10-CM | POA: Insufficient documentation

## 2019-02-05 LAB — CBC WITH DIFFERENTIAL/PLATELET
Basophils Absolute: 0 10*3/uL (ref 0.0–0.2)
Basos: 0 %
EOS (ABSOLUTE): 0.1 10*3/uL (ref 0.0–0.4)
Eos: 2 %
Hematocrit: 43.3 % (ref 34.0–46.6)
Hemoglobin: 14.5 g/dL (ref 11.1–15.9)
Immature Grans (Abs): 0 10*3/uL (ref 0.0–0.1)
Immature Granulocytes: 0 %
Lymphocytes Absolute: 2.4 10*3/uL (ref 0.7–3.1)
Lymphs: 40 %
MCH: 28 pg (ref 26.6–33.0)
MCHC: 33.5 g/dL (ref 31.5–35.7)
MCV: 84 fL (ref 79–97)
Monocytes Absolute: 0.3 10*3/uL (ref 0.1–0.9)
Monocytes: 6 %
Neutrophils Absolute: 3.1 10*3/uL (ref 1.4–7.0)
Neutrophils: 52 %
Platelets: 289 10*3/uL (ref 150–450)
RBC: 5.17 x10E6/uL (ref 3.77–5.28)
RDW: 14.1 % (ref 11.7–15.4)
WBC: 5.9 10*3/uL (ref 3.4–10.8)

## 2019-02-05 LAB — COMPREHENSIVE METABOLIC PANEL
ALT: 15 IU/L (ref 0–32)
AST: 22 IU/L (ref 0–40)
Albumin/Globulin Ratio: 1.6 (ref 1.2–2.2)
Albumin: 5 g/dL — ABNORMAL HIGH (ref 3.8–4.9)
Alkaline Phosphatase: 187 IU/L — ABNORMAL HIGH (ref 39–117)
BUN/Creatinine Ratio: 9 (ref 9–23)
BUN: 8 mg/dL (ref 6–24)
Bilirubin Total: 0.4 mg/dL (ref 0.0–1.2)
CO2: 22 mmol/L (ref 20–29)
Calcium: 10.6 mg/dL — ABNORMAL HIGH (ref 8.7–10.2)
Chloride: 102 mmol/L (ref 96–106)
Creatinine, Ser: 0.89 mg/dL (ref 0.57–1.00)
GFR calc Af Amer: 86 mL/min/{1.73_m2} (ref >59)
GFR calc non Af Amer: 74 mL/min/{1.73_m2} (ref >59)
Globulin, Total: 3.1 g/dL (ref 1.5–4.5)
Glucose: 94 mg/dL (ref 65–99)
Potassium: 4.6 mmol/L (ref 3.5–5.2)
Sodium: 140 mmol/L (ref 134–144)
Total Protein: 8.1 g/dL (ref 6.0–8.5)

## 2019-02-05 LAB — TSH: TSH: 2.74 u[IU]/mL (ref 0.450–4.500)

## 2019-02-05 LAB — LIPID PANEL
Chol/HDL Ratio: 4.6 ratio — ABNORMAL HIGH (ref 0.0–4.4)
Cholesterol, Total: 190 mg/dL (ref 100–199)
HDL: 41 mg/dL (ref >39)
LDL Chol Calc (NIH): 120 mg/dL — ABNORMAL HIGH (ref 0–99)
Triglycerides: 161 mg/dL — ABNORMAL HIGH (ref 0–149)
VLDL Cholesterol Cal: 29 mg/dL (ref 5–40)

## 2019-02-05 LAB — VITAMIN D 25 HYDROXY (VIT D DEFICIENCY, FRACTURES): Vit D, 25-Hydroxy: 22.3 ng/mL — ABNORMAL LOW (ref 30.0–100.0)

## 2019-02-05 LAB — VITAMIN B12: Vitamin B-12: 406 pg/mL (ref 232–1245)

## 2019-02-10 ENCOUNTER — Telehealth: Payer: Self-pay

## 2019-02-10 NOTE — Telephone Encounter (Signed)
Message left on patient voicemail that [employer] letter is ready for pick up. She need to pay fee & sign.

## 2019-02-27 ENCOUNTER — Other Ambulatory Visit: Payer: Self-pay | Admitting: Family Medicine

## 2019-02-27 DIAGNOSIS — Z1231 Encounter for screening mammogram for malignant neoplasm of breast: Secondary | ICD-10-CM

## 2019-02-28 ENCOUNTER — Ambulatory Visit
Admission: RE | Admit: 2019-02-28 | Discharge: 2019-02-28 | Disposition: A | Discharge status: Home / Self Care | Payer: Medicaid Other | Source: Ambulatory Visit | Attending: Family Medicine | Admitting: Family Medicine

## 2019-02-28 ENCOUNTER — Other Ambulatory Visit: Payer: Self-pay

## 2019-02-28 DIAGNOSIS — Z1231 Encounter for screening mammogram for malignant neoplasm of breast: Secondary | ICD-10-CM | POA: Diagnosis not present

## 2019-03-27 ENCOUNTER — Ambulatory Visit: Payer: Medicaid Other | Admitting: Adult Health

## 2019-03-27 NOTE — Progress Notes (Deleted)
[facility] [address] [place]. [place] [address] [phone]       FOLLOW UP NOTE  Brittany [name] Date of Birth:  02-24-1966 Medical Record Number:  [id number]   Referring MD:  Kathe Becton, NP Reason for Referral/visit:  neuropathy   HPI:   Update 03/27/2019: Brittany [name] is a 54 year old female who is being seen today for stroke follow-up as well as underlying neuropathy.  Residual stroke deficits ***.  She has since completed therapy due to ongoing improvement.  Continues to ambulate with Rollator walker and denies any recent falls.  She continues on aspirin 325 mg daily and atorvastatin 40 mg daily for secondary stroke prevention side effects.  Blood pressure today ***.  after prior visit, Lyrica discontinued as it did not provide any additional benefit about restarting gabapentin.  Her PCP recently placed a referral to pain clinic for ongoing management.  Update 11/19/2018: Brittany [name] is being seen today for ongoing neuropathy management along with past history of left PICA stroke in 11/2016.  Lyrica previously initiated with mild improvement therefore recommended increasing dose.  Due to left arm radiculopathy symptoms, MR cervical spine obtained on 11/05/2018 which showed multiple levels of disc bulging and moderate to severe foraminal stenosis.  She was referred to orthopedics for further evaluation and management with appointment scheduled in September.  She does continue to have numbness/tingling and increasing weakness compared to baseline left-sided weakness post stroke.  She states occasionally her hand will just give out on her and dropped object that she is holding.  She has continued on Lyrica 75 mg 3 times daily with initial mild benefit for the first 2 weeks but then symptoms returned.  She believes she had better benefit with use of gabapentin and requests restart of gabapentin.  She was unable to tolerate amitriptyline and duloxetine due to  anticholinergic side effects.  She remains on Robaxin.  She continues to ambulate with a Rollator walker and is requesting use of a smaller walker as she has difficulty maneuvering current walker at her house.  She is also requesting shower bench.  She continues to work with physical therapy for ongoing gait difficulties.  Neuropathy pain managed by Dr. Posey Pronto at physical medicine rehab but due to COVID-19, she has not had recent follow-up visit.  She has been stable from a stroke standpoint with continuation of aspirin 325 mg daily and atorvastatin 40 mg daily without side effects.  Blood pressure today satisfactory at 127/84.  No further concerns at this time.    Update virtual visit 08/05/2018: Brittany [name] is a 27 year pleasant African-American lady who was initially scheduled today for follow-up visit regarding neuropathy but due to COVID-19 safety precautions, visit transition to telemedicine via [email or web address] with patient's consent.   She endorses significant paresthesias and discomfort in her feet, hands bilaterally and left arm > right arm which has been present for greater than 10 years.  She did undergo EMG which did not show evidence of peripheral neuropathy but did show mild bilateral carpal tunnel syndrome. Patient was admitted to [facility] in September 2018 with dizziness and was found to have left posterior inferior cerebellar artery infarct which was evidenced on MRI. Initial visit on 01/23/2018 with Dr. Leonie Man and recommended initiating Topamax and continue gabapentin which she was previously on.  Neuropathy panel and EMG unremarkable except for mild bilateral carpal tunnel syndrome.  She unfortunately was unable to tolerate Topamax due to memory side effects and therefore recommended initiating Lyrica  at prior office visit on 04/29/2018 with discontinuing gabapentin.  At today's visit, she does endorse mild benefit with use of Lyrica but does continue to experience paresthesias.  She does  endorse occasional subjective weakness of left hand where she will frequently drop items.  Left arm pain has been worsening where she feels a burning and numbness sensation from her shoulder down to her fingertips with left-sided neck, trapezius and shoulder pain.  This pain has been present "for as long as I can remember" but has recently only been worsening.    She does endorse previously trying physical therapy for this neck pain but did not gain much benefit.  She unfortunately has not used wrist splints as recommended for carpal tunnel syndrome as she is currently on disability and unable to afford them.  She declined interest in release procedure at this time.She also endorses left>right bilateral lower extremity swelling and occasional left wrist/hand swelling.  She continues to work with her PCP in regards to the swelling and is currently participating in therapy but has not gained much benefit.    ROS:   14 system review of systems is positive for dizziness, numbness, weakness, joint pain, joint swelling, back pain, ecchymosis, muscle cramps, neck pain, neck stiffness, moles, itching, frequency of urination, abdominal pain, constipation, shortness of breath, chest pain, leg swelling, double vision, blurred vision, fatigue and excessive sweating and all other systems negative  PMH:  Past Medical History:  Diagnosis Date  . Anemia   . Arthritis    trigger finger in left hand  . Complication of anesthesia    per pt, hard to wake up!  . Elevated cholesterol   . Female bladder prolapse    per urologist, does not have prolaspe  . GERD (gastroesophageal reflux disease)   . Heart murmur    pt unsure.  . Hiatal hernia   . Hypertension   . IBS (irritable bowel syndrome)   . Multiple myeloma ([place]) 2005   had partial chemo  . Peripheral neuropathy   . SOB (shortness of breath) on exertion    uses an inhaler  . Stroke [facility]) 2017   paralysis left arm/uses a walker    Social History:    Social History   Socioeconomic History  . Marital status: Legally Separated    Spouse name: Not on file  . Number of children: Not on file  . Years of education: Not on file  . Highest education level: Not on file  Occupational History  . Occupation: Disabled  Tobacco Use  . Smoking status: Former Smoker    Quit date: 12/06/2015    Years since quitting: 3.3  . Smokeless tobacco: Never Used  Substance and Sexual Activity  . Alcohol use: Not Currently  . Drug use: Not Currently    Types: Marijuana    Comment: in the past   . Sexual activity: Not Currently  Other Topics Concern  . Not on file  Social History Narrative  . Not on file   Social Determinants of Health   Financial Resource Strain:   . Difficulty of Paying Living Expenses: Not on file  Food Insecurity:   . Worried About Charity fundraiser in the Last Year: Not on file  . Ran Out of Food in the Last Year: Not on file  Transportation Needs:   . Lack of Transportation (Medical): Not on file  . Lack of Transportation (Non-Medical): Not on file  Physical Activity:   . Days of Exercise  per Week: Not on file  . Minutes of Exercise per Session: Not on file  Stress:   . Feeling of Stress : Not on file  Social Connections:   . Frequency of Communication with Friends and Family: Not on file  . Frequency of Social Gatherings with Friends and Family: Not on file  . Attends Religious Services: Not on file  . Active Member of Clubs or Organizations: Not on file  . Attends Archivist Meetings: Not on file  . Marital Status: Not on file  Intimate Partner Violence:   . Fear of Current or Ex-Partner: Not on file  . Emotionally Abused: Not on file  . Physically Abused: Not on file  . Sexually Abused: Not on file    Medications:   Current Outpatient Medications on File Prior to Visit  Medication Sig Dispense Refill  . albuterol (VENTOLIN HFA) 108 (90 Base) MCG/ACT inhaler Inhale 2 puffs into the lungs every 4  (four) hours as needed for wheezing or shortness of breath (cough, shortness of breath or wheezing.). 6.7 g 6  . amLODipine (NORVASC) 10 MG tablet Take 1 tablet (10 mg total) by mouth daily. 30 tablet 2  . aspirin 325 MG tablet Take 1 tablet (325 mg total) by mouth daily. 30 tablet 0  . atorvastatin (LIPITOR) 40 MG tablet Take 1 tablet (40 mg total) by mouth daily. 30 tablet 3  . cyanocobalamin 1000 MCG tablet Take 1 tablet (1,000 mcg total) by mouth daily. 30 tablet 4  . dimenhyDRINATE (DRAMAMINE) 50 MG tablet Take 50 mg by mouth every 8 (eight) hours as needed for dizziness.    . docusate sodium (COLACE) 100 MG capsule Take 200 mg by mouth 2 (two) times daily.     . Ergocalciferol (VITAMIN D2) 10 MCG (400 UNIT) TABS Take 10 mcg by mouth daily. 30 tablet 6  . furosemide (LASIX) 20 MG tablet Take 1 tablet by mouth twice daily as needed 180 tablet 0  . gabapentin (NEURONTIN) 300 MG capsule Take 1 capsule (300 mg total) by mouth 3 (three) times daily. 90 capsule 3  . hydrOXYzine (VISTARIL) 50 MG capsule Take 1-2 capsules (50-100 mg total) by mouth 3 (three) times daily as needed for itching. 30 capsule 1  . methocarbamol (ROBAXIN) 500 MG tablet Take 1 tablet (500 mg total) by mouth 3 (three) times daily. 90 tablet 2  . Multiple Vitamin (MULTIVITAMIN) capsule Take 4 capsules by mouth 2 (two) times daily. Centrum plus    . NIFEdipine (ADALAT CC) 30 MG 24 hr tablet Take 2 tablets (60 mg total) by mouth daily. 60 tablet 6  . omeprazole (PRILOSEC) 20 MG capsule Take 1 capsule (20 mg total) by mouth 2 (two) times daily before a meal. 60 capsule 3  . senna (SENOKOT) 8.6 MG TABS tablet Take 2 tablets by mouth 2 (two) times daily.    Marland Kitchen sulfamethoxazole-trimethoprim (BACTRIM DS) 800-160 MG tablet Take 1 tablet by mouth 2 (two) times daily. 14 tablet 0  . triamcinolone cream (KENALOG) 0.1 % Apply 1 application topically 2 (two) times daily. 454 g 0   No current facility-administered medications on file prior  to visit.    Allergies:   Allergies  Allergen Reactions  . Amitriptyline Other (See Comments)    Patient reported that it made her throat feel like its locking up and it also caused issues with her going to the bathroom  . Duloxetine Other (See Comments)    Patient reported that it made  her throat lock up and it caused her to have issue with going to the bathroom  . Naproxen     Vomiting, sweating, abd spasms  . Other     States can't take pain meds that end in "cet" or meds that end in "ine" Darvocet/severe vomiting  . Darvon [Propoxyphene] Nausea And Vomiting  . Hydrocodone Nausea And Vomiting  . Lactose Intolerance (Gi)     Bloating, gas, abd pain  . Latex Itching and Rash  . Oxycodone Nausea And Vomiting  . Percocet [Oxycodone-Acetaminophen] Nausea And Vomiting  . Topamax [Topiramate]     Memory made her emotional     There were no vitals filed for this visit. There is no height or weight on file to calculate BMI.  Physical Exam General: well developed, well nourished,  pleasant middle-aged African-American female, seated, in no evident distress Head: head normocephalic and atraumatic.   Neck: supple with no carotid or supraclavicular bruits Cardiovascular: regular rate and rhythm, no murmurs Musculoskeletal: no deformity Skin:  no rash/petichiae Vascular:  Normal pulses all extremities   Neurologic Exam Mental Status: Awake and fully alert. Oriented to place and time. Recent and remote memory intact. Attention span, concentration and fund of knowledge appropriate. Mood and affect appropriate.  Cranial Nerves: Fundoscopic exam reveals sharp disc margins. Pupils equal, briskly reactive to light. Extraocular movements full without nystagmus. Visual fields full to confrontation. Hearing intact. Facial sensation intact. Face, tongue, palate moves normally and symmetrically.  Motor: Normal bulk and tone.  Chronic left hemiparesis from prior stroke with subjective worsening at  times due to cervical radiculopathy Sensory.:  Decreased sensation BLE distally and LUE Coordination: Rapid alternating movements normal in all extremities except slightly diminished in left hand. Finger-to-nose and heel-to-shin performed accurately bilaterally. Gait and Station: Arises from chair without difficulty. Stance is normal. Gait demonstrates  broad-based gait with use of Rollator walker Reflexes: 1+ and symmetric. Toes downgoing.    IMAGING: MR CERVICAL SPINE WO CONTRAST 11/05/2018 IMPRESSION:  MRI cervical spine (without) demonstrating: - At C4-5. C5-6: disc bulging and uncovertebral joint hypertrophy with severe left foraminal stenosis. - At C3-4: disc bulging and uncovertebral joint hypertrophy with moderate biforaminal stenosis.        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  Vascular risk factors of hypertension, hyperlipidemia and patent foramen ovale.  Mild bilateral carpal tunnel syndrome.  Due to complaints of left arm radiculopathy, MR cervical spine obtained which showed multiple levels of disc bulging and moderate to severe foraminal stenosis therefore referred to orthopedics for further management.  Difficulty tolerating duloxetine and amitriptyline due to anticholinergic side effects.     PLAN: -Denies benefit with use of Lyrica and request return to gabapentin.  Lyrica discontinued and order placed for gabapentin 300 mg 3 times daily (previously on 600 mg 3 times daily but advised her that dosage will slowly have to be increased) -Discontinued amitriptyline and duloxetine due to anticholinergic side effects -Initiate venlafaxine 37.5 mg for 7 days and then increase to 75 mg daily for ongoing neuropathic pain -Initial orthopedic evaluation scheduled in  September for left arm likely cervical radiculopathy -Recommend rescheduling follow-up visit with Dr. Posey Pronto as she should continue to follow with him for ongoing pain management -this was discussed with patient and verbalized agreement -Continue aspirin and atorvastatin for secondary stroke prevention -Requesting  different walker and shower chair -  will follow-up in regards to if this was sent to physical medicine rehab by therapy prior to ordering due to ongoing balance difficulties secondary to neuropathy and residual stroke deficits  Follow-up in 4 months or call earlier if needed  Greater than 50% time during this 25-minute visit was spent on counseling and coordination of care about her neuropathic pain, potential cervical radiculopathy and ongoing paresthesias.  Discussion also regarding ongoing pain management and secondary stroke prevention.  All questions answered to patient satisfaction.   Frann Rider, AGNP-BC  [facility] [address] [place], [place] [address]  Phone [phone] Fax [phone] Note: This document was prepared with digital dictation and possible smart phrase technology. Any transcriptional errors that result from this process are unintentional.

## 2019-03-31 ENCOUNTER — Encounter: Payer: Self-pay | Admitting: Adult Health

## 2019-04-28 ENCOUNTER — Other Ambulatory Visit: Payer: Self-pay | Admitting: Family Medicine

## 2019-04-28 DIAGNOSIS — I10 Essential (primary) hypertension: Secondary | ICD-10-CM

## 2019-04-28 NOTE — Telephone Encounter (Signed)
Patient has a follow up in May. Do you want to increase her refills?  Please advise.

## 2019-04-29 ENCOUNTER — Other Ambulatory Visit: Payer: Self-pay | Admitting: Family Medicine

## 2019-04-29 DIAGNOSIS — I10 Essential (primary) hypertension: Secondary | ICD-10-CM

## 2019-04-30 ENCOUNTER — Other Ambulatory Visit: Payer: Self-pay

## 2019-04-30 ENCOUNTER — Telehealth: Payer: Self-pay | Admitting: Family Medicine

## 2019-04-30 DIAGNOSIS — I10 Essential (primary) hypertension: Secondary | ICD-10-CM

## 2019-04-30 MED ORDER — NIFEDIPINE ER 30 MG PO TB24: 60.0000 mg | ORAL_TABLET | Freq: Every day | ORAL | 3 refills | Status: DC

## 2019-04-30 NOTE — Telephone Encounter (Signed)
Pt called about pharmacy request sent over days ago.

## 2019-07-18 ENCOUNTER — Telehealth: Payer: Self-pay

## 2019-07-18 NOTE — Telephone Encounter (Signed)
Patient will come in to fill out her part of the [employer] forms on Monday.

## 2019-07-29 ENCOUNTER — Telehealth: Payer: Self-pay | Admitting: Family Medicine

## 2019-07-29 NOTE — Telephone Encounter (Signed)
Pt called wanting to verify that her paperwork is ready and if there is anything she needs to fill out. Please follow up with pt.

## 2019-08-04 ENCOUNTER — Ambulatory Visit (HOSPITAL_COMMUNITY)
Admission: RE | Admit: 2019-08-04 | Discharge: 2019-08-04 | Disposition: A | Discharge status: Home / Self Care | Payer: Medicaid Other | Source: Ambulatory Visit | Attending: Family Medicine | Admitting: Family Medicine

## 2019-08-04 ENCOUNTER — Other Ambulatory Visit: Payer: Self-pay

## 2019-08-04 ENCOUNTER — Encounter: Payer: Self-pay | Admitting: Family Medicine

## 2019-08-04 ENCOUNTER — Ambulatory Visit (INDEPENDENT_AMBULATORY_CARE_PROVIDER_SITE_OTHER): Payer: Medicaid Other | Admitting: Family Medicine

## 2019-08-04 VITALS — BP 162/80 | HR 106 | Temp 98.3°F | Ht 61.0 in | Wt 157.0 lb

## 2019-08-04 DIAGNOSIS — M25511 Pain in right shoulder: Secondary | ICD-10-CM

## 2019-08-04 DIAGNOSIS — R531 Weakness: Secondary | ICD-10-CM | POA: Diagnosis not present

## 2019-08-04 DIAGNOSIS — M25512 Pain in left shoulder: Secondary | ICD-10-CM | POA: Diagnosis not present

## 2019-08-04 DIAGNOSIS — I639 Cerebral infarction, unspecified: Secondary | ICD-10-CM | POA: Diagnosis present

## 2019-08-04 DIAGNOSIS — M19011 Primary osteoarthritis, right shoulder: Secondary | ICD-10-CM | POA: Diagnosis not present

## 2019-08-04 DIAGNOSIS — Z0000 Encounter for general adult medical examination without abnormal findings: Secondary | ICD-10-CM | POA: Diagnosis not present

## 2019-08-04 DIAGNOSIS — G8929 Other chronic pain: Secondary | ICD-10-CM

## 2019-08-04 DIAGNOSIS — I10 Essential (primary) hypertension: Secondary | ICD-10-CM

## 2019-08-04 DIAGNOSIS — Z09 Encounter for follow-up examination after completed treatment for conditions other than malignant neoplasm: Secondary | ICD-10-CM

## 2019-08-04 LAB — POCT URINALYSIS DIPSTICK
Bilirubin, UA: NEGATIVE
Glucose, UA: NEGATIVE
Ketones, UA: NEGATIVE
Leukocytes, UA: NEGATIVE
Nitrite, UA: NEGATIVE
Protein, UA: NEGATIVE
Spec Grav, UA: 1.01 (ref 1.010–1.025)
Urobilinogen, UA: 0.2 E.U./dL
pH, UA: 6.5 (ref 5.0–8.0)

## 2019-08-04 LAB — GLUCOSE, POCT (MANUAL RESULT ENTRY): POC Glucose: 156 mg/dl — AB (ref 70–99)

## 2019-08-04 NOTE — Progress Notes (Signed)
[facility]   Established Patient Office Visit  Subjective:  Patient ID: Brittany [name], female    DOB: 03/12/1966  Age: 53 y.o. MRN: [id number]  CC:  Chief Complaint  Patient presents with  . Follow-up    HPI Brittany [name] is a 53 year old female who presents for Follow Up today.   Past Medical History:  Diagnosis Date  . Anemia   . Arthritis    trigger finger in left hand  . Complication of anesthesia    per pt, hard to wake up!  . Elevated cholesterol   . Female bladder prolapse    per urologist, does not have prolaspe  . GERD (gastroesophageal reflux disease)   . Heart murmur    pt unsure.  . Hiatal hernia   . Hypertension   . IBS (irritable bowel syndrome)   . Multiple myeloma (HCC) 2005   had partial chemo  . Peripheral neuropathy   . SOB (shortness of breath) on exertion    uses an inhaler  . Stroke (HCC) 2017   paralysis left arm/uses a walker   Current Status: Since her last office visit, she is doing well with no complaints. She is doing well, but is requesting assistive device for bathing. She also has c/o bilateral shoulder pain and is unable to perform full ROM of shoulders. She continues to ambulate with Rollator, because of residual weakness r/t to Stroke history. She denies fevers, chills, fatigue, recent infections, weight loss, and night sweats. Denies GI problems such as nausea, vomiting, diarrhea, and constipation. She has no reports of blood in stools, dysuria and hematuria. No depression or anxiety reported today. She denies suicidal ideations, homicidal ideations, or auditory hallucinations. She is taking all medications as prescribed.   Past Surgical History:  Procedure Laterality Date  . BONE BIOPSY  2005   in her back  . COLONOSCOPY     over 10 years x3  . ESOPHAGOGASTRODUODENOSCOPY     incomplete-over 10 years ago   . TEE WITHOUT CARDIOVERSION N/A 12/08/2016   Procedure: TRANSESOPHAGEAL  ECHOCARDIOGRAM (TEE);  Surgeon: Turner, Traci R, MD;  Location: [facility];  Service: Cardiovascular;  Laterality: N/A;  . TUBAL LIGATION    . UPPER GASTROINTESTINAL ENDOSCOPY    . WISDOM TOOTH EXTRACTION      Family History  Problem Relation Age of Onset  . Hypertension Mother   . Sarcoidosis Mother        currently in remission   . Diverticulitis Mother   . Irritable bowel syndrome Mother   . Liver cancer Mother   . Hypertension Father   . Stomach cancer Father   . Congestive Heart Failure Father   . Stroke Maternal Uncle   . Scoliosis Brother   . Colon cancer Neg Hx   . Esophageal cancer Neg Hx   . Colon polyps Neg Hx   . Rectal cancer Neg Hx     Social History   Socioeconomic History  . Marital status: Legally Separated    Spouse name: Not on file  . Number of children: Not on file  . Years of education: Not on file  . Highest education level: Not on file  Occupational History  . Occupation: Disabled  Tobacco Use  . Smoking status: Former Smoker    Quit date: 12/06/2015    Years since quitting: 3.6  . Smokeless tobacco: Never Used  Substance and Sexual Activity  . Alcohol use: Not Currently  .   Drug use: Not Currently    Types: Marijuana    Comment: in the past   . Sexual activity: Not Currently  Other Topics Concern  . Not on file  Social History Narrative  . Not on file   Social Determinants of Health   Financial Resource Strain:   . Difficulty of Paying Living Expenses:   Food Insecurity:   . Worried About Charity fundraiser in the Last Year:   . Arboriculturist in the Last Year:   Transportation Needs:   . Film/video editor (Medical):   Marland Kitchen Lack of Transportation (Non-Medical):   Physical Activity:   . Days of Exercise per Week:   . Minutes of Exercise per Session:   Stress:   . Feeling of Stress :   Social Connections:   . Frequency of Communication with Friends and Family:   . Frequency of Social Gatherings with Friends and Family:     . Attends Religious Services:   . Active Member of Clubs or Organizations:   . Attends Archivist Meetings:   Marland Kitchen Marital Status:   Intimate Partner Violence:   . Fear of Current or Ex-Partner:   . Emotionally Abused:   Marland Kitchen Physically Abused:   . Sexually Abused:     Outpatient Medications Prior to Visit  Medication Sig Dispense Refill  . albuterol (VENTOLIN HFA) 108 (90 Base) MCG/ACT inhaler Inhale 2 puffs into the lungs every 4 (four) hours as needed for wheezing or shortness of breath (cough, shortness of breath or wheezing.). 6.7 g 6  . amLODipine (NORVASC) 10 MG tablet Take 1 tablet (10 mg total) by mouth daily. 30 tablet 2  . aspirin 325 MG tablet Take 1 tablet (325 mg total) by mouth daily. 30 tablet 0  . atorvastatin (LIPITOR) 40 MG tablet Take 1 tablet (40 mg total) by mouth daily. 30 tablet 3  . cyanocobalamin 1000 MCG tablet Take 1 tablet (1,000 mcg total) by mouth daily. 30 tablet 4  . dimenhyDRINATE (DRAMAMINE) 50 MG tablet Take 50 mg by mouth every 8 (eight) hours as needed for dizziness.    . docusate sodium (COLACE) 100 MG capsule Take 200 mg by mouth 2 (two) times daily.     . Ergocalciferol (VITAMIN D2) 10 MCG (400 UNIT) TABS Take 10 mcg by mouth daily. 30 tablet 6  . furosemide (LASIX) 20 MG tablet Take 1 tablet by mouth twice daily as needed 180 tablet 0  . gabapentin (NEURONTIN) 300 MG capsule Take 1 capsule (300 mg total) by mouth 3 (three) times daily. 90 capsule 3  . hydrOXYzine (VISTARIL) 50 MG capsule Take 1-2 capsules (50-100 mg total) by mouth 3 (three) times daily as needed for itching. 30 capsule 1  . methocarbamol (ROBAXIN) 500 MG tablet Take 1 tablet (500 mg total) by mouth 3 (three) times daily. 90 tablet 2  . Multiple Vitamin (MULTIVITAMIN) capsule Take 4 capsules by mouth 2 (two) times daily. Centrum plus    . NIFEdipine (ADALAT CC) 30 MG 24 hr tablet Take 2 tablets (60 mg total) by mouth daily. 60 tablet 3  . omeprazole (PRILOSEC) 20 MG capsule  Take 1 capsule (20 mg total) by mouth 2 (two) times daily before a meal. 60 capsule 3  . senna (SENOKOT) 8.6 MG TABS tablet Take 2 tablets by mouth 2 (two) times daily.    Marland Kitchen triamcinolone cream (KENALOG) 0.1 % Apply 1 application topically 2 (two) times daily. 454 g 0  .  sulfamethoxazole-trimethoprim (BACTRIM DS) 800-160 MG tablet Take 1 tablet by mouth 2 (two) times daily. 14 tablet 0   No facility-administered medications prior to visit.    Allergies  Allergen Reactions  . Amitriptyline Other (See Comments)    Patient reported that it made her throat feel like its locking up and it also caused issues with her going to the bathroom  . Duloxetine Other (See Comments)    Patient reported that it made her throat lock up and it caused her to have issue with going to the bathroom  . Naproxen     Vomiting, sweating, abd spasms  . Other     States can't take pain meds that end in "cet" or meds that end in "ine" Darvocet/severe vomiting  . Darvon [Propoxyphene] Nausea And Vomiting  . Hydrocodone Nausea And Vomiting  . Lactose Intolerance (Gi)     Bloating, gas, abd pain  . Latex Itching and Rash  . Oxycodone Nausea And Vomiting  . Percocet [Oxycodone-Acetaminophen] Nausea And Vomiting  . Topamax [Topiramate]     Memory made her emotional     ROS Review of Systems  Constitutional: Negative.   HENT: Negative.   Eyes: Negative.   Respiratory: Negative.   Cardiovascular: Negative.   Gastrointestinal: Negative.   Endocrine: Negative.   Genitourinary: Negative.   Musculoskeletal: Positive for arthralgias (generalized).  Skin: Negative.   Allergic/Immunologic: Negative.   Neurological: Positive for dizziness (occasional ), weakness (generalized weakness r/t stroke history.) and headaches (occasional ).  Psychiatric/Behavioral: Negative.       Objective:    Physical Exam  Constitutional: She is oriented to person, place, and time. She appears well-developed and well-nourished.    rollator  HENT:  Head: Normocephalic and atraumatic.  Eyes: Conjunctivae are normal.  Cardiovascular: Normal rate, regular rhythm, normal heart sounds and intact distal pulses.  Pulmonary/Chest: Effort normal and breath sounds normal.  Abdominal: Soft. Bowel sounds are normal.  Musculoskeletal:     Cervical back: Normal range of motion and neck supple.     Comments: Limited ROM bilateral shoulders.   Neurological: She is alert and oriented to person, place, and time.  Skin: Skin is warm and dry.  Nursing note and vitals reviewed.   BP (!) 162/80   Pulse (!) 106   Temp 98.3 F (36.8 C)   Ht 5' 1" (1.549 m)   Wt 157 lb (71.2 kg)   SpO2 97%   BMI 29.66 kg/m  Wt Readings from Last 3 Encounters:  08/04/19 157 lb (71.2 kg)  02/04/19 171 lb (77.6 kg)  12/04/18 167 lb (75.8 kg)     Health Maintenance Due  Topic Date Due  . COVID-19 Vaccine (1) Never done  . PAP SMEAR-Modifier  Never done    There are no preventive care reminders to display for this patient.  Lab Results  Component Value Date   TSH 2.740 02/04/2019   Lab Results  Component Value Date   WBC 5.9 02/04/2019   HGB 14.5 02/04/2019   HCT 43.3 02/04/2019   MCV 84 02/04/2019   PLT 289 02/04/2019   Lab Results  Component Value Date   NA 140 02/04/2019   K 4.6 02/04/2019   CO2 22 02/04/2019   GLUCOSE 94 02/04/2019   BUN 8 02/04/2019   CREATININE 0.89 02/04/2019   BILITOT 0.4 02/04/2019   ALKPHOS 187 (H) 02/04/2019   AST 22 02/04/2019   ALT 15 02/04/2019   PROT 8.1 02/04/2019   ALBUMIN 5.0 (H)   02/04/2019   CALCIUM 10.6 (H) 02/04/2019   ANIONGAP 15 10/15/2018   Lab Results  Component Value Date   CHOL 190 02/04/2019   Lab Results  Component Value Date   HDL 41 02/04/2019   Lab Results  Component Value Date   LDLCALC 120 (H) 02/04/2019   Lab Results  Component Value Date   TRIG 161 (H) 02/04/2019   Lab Results  Component Value Date   CHOLHDL 4.6 (H) 02/04/2019   Lab Results   Component Value Date   HGBA1C 6.2 (A) 10/08/2018      Assessment & Plan:   1. Chronic pain of both shoulders  2. Cerebrovascular accident (CVA), unspecified mechanism ([place]) - Ambulatory referral to [place] Shoulder Left; Future - DG Shoulder Right; Future  3. Generalized weakness  4. Other chronic pain  5. Hypertension, unspecified type She will continue to take medications as prescribed, to decrease high sodium intake, excessive alcohol intake, increase potassium intake, smoking cessation, and increase physical activity of at least 30 minutes of cardio activity daily. She will continue to follow Heart Healthy or DASH diet.  6. Healthcare maintenance - POCT urinalysis dipstick - POCT glycosylated hemoglobin (Hb A1C) - POCT glucose (manual entry)  7. Follow up She will follow up in 6 months.   No orders of the defined types were placed in this encounter.   Orders Placed This Encounter  Procedures  . DG Shoulder Left  . DG Shoulder Right  . Ambulatory referral to Home Health  . POCT urinalysis dipstick  . POCT glycosylated hemoglobin (Hb A1C)  . POCT glucose (manual entry)     Referral Orders     Ambulatory referral to [place],  MSN, FNP-BC [place], [place] [address] [phone] fax  Problem List Items Addressed This Visit      Cardiovascular and Mediastinum   Hypertension   Stroke (cerebrum) [facility])   Relevant Orders   Ambulatory referral to [place]   DG Shoulder Left   DG Shoulder Right    Other Visit Diagnoses    Chronic pain of both shoulders    -  Primary   Generalized weakness       Other chronic pain       Healthcare maintenance       Relevant Orders   POCT urinalysis dipstick (Completed)   POCT glycosylated hemoglobin (Hb A1C)   POCT glucose (manual entry) (Completed)   Follow up          No  orders of the defined types were placed in this encounter.   Follow-up: Return in about 6 months (around 02/04/2020).    Azzie Glatter, FNP

## 2019-08-19 ENCOUNTER — Other Ambulatory Visit: Payer: Self-pay | Admitting: Family Medicine

## 2019-08-19 DIAGNOSIS — I639 Cerebral infarction, unspecified: Secondary | ICD-10-CM

## 2019-08-19 DIAGNOSIS — Z0189 Encounter for other specified special examinations: Secondary | ICD-10-CM

## 2019-08-19 DIAGNOSIS — R531 Weakness: Secondary | ICD-10-CM

## 2019-08-21 ENCOUNTER — Telehealth: Payer: Self-pay

## 2019-08-21 NOTE — Telephone Encounter (Signed)
Patient has been informed: The [employer] forms are at the front desk for pick up. Per Lanelle Bal NP she will refer her to PT/Rehab, to complete the forms.  Forms to front desk for pick up.

## 2019-08-28 ENCOUNTER — Telehealth: Payer: Self-pay | Admitting: Family Medicine

## 2019-08-28 NOTE — Telephone Encounter (Signed)
Pt having arm pain and collar bone pain. Pt has not received call regarding x-rays. Pt states she didn't get a call for therapy either. Can you verify a referral was sent for therapy

## 2019-09-23 ENCOUNTER — Telehealth: Payer: Self-pay | Admitting: Family Medicine

## 2019-09-23 NOTE — Telephone Encounter (Signed)
Pts father called wanting to know the address and phone number to the physical therapy place we referred pt to

## 2019-09-30 ENCOUNTER — Telehealth: Payer: Self-pay | Admitting: Family Medicine

## 2019-09-30 NOTE — Telephone Encounter (Signed)
Pt wants to see if they could be [facility] [address] [facility] [place]. Pt has been there previously for her legs, so she wants to go there for her arms too.

## 2019-10-01 ENCOUNTER — Encounter: Payer: Self-pay | Admitting: Family Medicine

## 2019-10-01 ENCOUNTER — Other Ambulatory Visit: Payer: Self-pay | Admitting: Family Medicine

## 2019-10-01 DIAGNOSIS — R531 Weakness: Secondary | ICD-10-CM

## 2019-10-01 DIAGNOSIS — I639 Cerebral infarction, unspecified: Secondary | ICD-10-CM

## 2019-10-06 NOTE — Telephone Encounter (Signed)
Pt was called @ 12:17 pm to let her know to come in and pick up her paperwork to be signed.

## 2019-10-07 ENCOUNTER — Other Ambulatory Visit: Payer: Self-pay | Admitting: Family Medicine

## 2019-10-07 DIAGNOSIS — Z139 Encounter for screening, unspecified: Secondary | ICD-10-CM

## 2019-10-09 NOTE — Telephone Encounter (Signed)
Pt came in on 10/07/19 and picked up her paperwork.

## 2019-10-13 ENCOUNTER — Telehealth: Payer: Self-pay | Admitting: Physical Therapy

## 2019-10-13 NOTE — Telephone Encounter (Signed)
I spoke to Brittany [name] and told her [employer] does not pay for FCE. She reported she is under managed care [employer]. Now so I asked her to contact her insurance company to see if they will pay for FCE under the [employer] managed care plan. She said she would do this and contact us about the possible coverage/authorization.

## 2019-10-23 NOTE — Telephone Encounter (Signed)
-----   Message from Azzie Glatter, [place] sent at 10/01/2019  9:21 PM EDT ----- Regarding: "Functiona Ability Forms" Patient has left forms for my completion, which I have discussed with her several times will need to be completed by PT. I previously sent referral on 08/19/2019. Referral sent again today to PT for assessment, and completion of forms (check you 'Purple' folder for her forms). Please inform patient that Physical Therapy office will contact her for appointment, so she will need to pick forms up from our office and take with her to this appointment for completion. Thanks.

## 2019-10-27 ENCOUNTER — Telehealth (INDEPENDENT_AMBULATORY_CARE_PROVIDER_SITE_OTHER): Payer: Medicaid Other | Admitting: Family Medicine

## 2019-10-27 ENCOUNTER — Other Ambulatory Visit: Payer: Self-pay

## 2019-10-27 ENCOUNTER — Encounter: Payer: Self-pay | Admitting: Family Medicine

## 2019-10-27 DIAGNOSIS — I10 Essential (primary) hypertension: Secondary | ICD-10-CM | POA: Diagnosis not present

## 2019-10-27 DIAGNOSIS — I639 Cerebral infarction, unspecified: Secondary | ICD-10-CM | POA: Diagnosis not present

## 2019-10-27 DIAGNOSIS — Z0189 Encounter for other specified special examinations: Secondary | ICD-10-CM

## 2019-10-27 DIAGNOSIS — Z139 Encounter for screening, unspecified: Secondary | ICD-10-CM | POA: Diagnosis not present

## 2019-10-27 DIAGNOSIS — R531 Weakness: Secondary | ICD-10-CM | POA: Diagnosis not present

## 2019-10-27 DIAGNOSIS — Z09 Encounter for follow-up examination after completed treatment for conditions other than malignant neoplasm: Secondary | ICD-10-CM | POA: Diagnosis not present

## 2019-10-27 NOTE — Progress Notes (Signed)
Virtual Visit via Telephone Note  I connected with Brittany [name] on 10/27/19 at  3:20 PM EDT by telephone and verified that I am speaking with the correct person using two identifiers.   I discussed the limitations, risks, security and privacy concerns of performing an evaluation and management service by telephone and the availability of in person appointments. I also discussed with the patient that there may be a patient responsible charge related to this service. The patient expressed understanding and agreed to proceed.  Televisit Today Patient Location: Home Provider Location: Office   History of Present Illness:  Past Surgical History:  Procedure Laterality Date  . BONE BIOPSY  2005   in her back  . COLONOSCOPY     over 10 years x3  . ESOPHAGOGASTRODUODENOSCOPY     incomplete-over 10 years ago   . TEE WITHOUT CARDIOVERSION N/A 12/08/2016   Procedure: TRANSESOPHAGEAL ECHOCARDIOGRAM (TEE);  Surgeon: Sueanne Margarita, MD;  Location: [facility] ENDOSCOPY;  Service: Cardiovascular;  Laterality: N/A;  . TUBAL LIGATION    . UPPER GASTROINTESTINAL ENDOSCOPY    . WISDOM TOOTH EXTRACTION     Social History   Socioeconomic History  . Marital status: Legally Separated    Spouse name: Not on file  . Number of children: Not on file  . Years of education: Not on file  . Highest education level: Not on file  Occupational History  . Occupation: Disabled  Tobacco Use  . Smoking status: Former Smoker    Quit date: 12/06/2015    Years since quitting: 3.8  . Smokeless tobacco: Never Used  Vaping Use  . Vaping Use: Never used  Substance and Sexual Activity  . Alcohol use: Not Currently  . Drug use: Not Currently    Types: Marijuana    Comment: in the past   . Sexual activity: Not Currently  Other Topics Concern  . Not on file  Social History Narrative  . Not on file   Social Determinants of Health   Financial Resource Strain:   . Difficulty of Paying Living Expenses:   Food  Insecurity:   . Worried About Charity fundraiser in the Last Year:   . Arboriculturist in the Last Year:   Transportation Needs:   . Film/video editor (Medical):   Marland Kitchen Lack of Transportation (Non-Medical):   Physical Activity:   . Days of Exercise per Week:   . Minutes of Exercise per Session:   Stress:   . Feeling of Stress :   Social Connections:   . Frequency of Communication with Friends and Family:   . Frequency of Social Gatherings with Friends and Family:   . Attends Religious Services:   . Active Member of Clubs or Organizations:   . Attends Archivist Meetings:   Marland Kitchen Marital Status:   Intimate Partner Violence:   . Fear of Current or Ex-Partner:   . Emotionally Abused:   Marland Kitchen Physically Abused:   . Sexually Abused:     Past Medical History:  Diagnosis Date  . Anemia   . Arthritis    trigger finger in left hand  . Complication of anesthesia    per pt, hard to wake up!  . Elevated cholesterol   . Female bladder prolapse    per urologist, does not have prolaspe  . GERD (gastroesophageal reflux disease)   . Heart murmur    pt unsure.  . Hiatal hernia   . Hypertension   . IBS (irritable  bowel syndrome)   . Multiple myeloma ([place]) 2005   had partial chemo  . Peripheral neuropathy   . SOB (shortness of breath) on exertion    uses an inhaler  . Stroke [facility]) 2017   paralysis left arm/uses a walker    Family History  Problem Relation Age of Onset  . Hypertension Mother   . Sarcoidosis Mother        currently in remission   . Diverticulitis Mother   . Irritable bowel syndrome Mother   . Liver cancer Mother   . Hypertension Father   . Stomach cancer Father   . Congestive Heart Failure Father   . Stroke Maternal Uncle   . Scoliosis Brother   . Colon cancer Neg Hx   . Esophageal cancer Neg Hx   . Colon polyps Neg Hx   . Rectal cancer Neg Hx     Allergies  Allergen Reactions  . Amitriptyline Other (See Comments)    Patient reported that it  made her throat feel like its locking up and it also caused issues with her going to the bathroom  . Duloxetine Other (See Comments)    Patient reported that it made her throat lock up and it caused her to have issue with going to the bathroom  . Naproxen     Vomiting, sweating, abd spasms  . Other     States can't take pain meds that end in "cet" or meds that end in "ine" Darvocet/severe vomiting  . Darvon [Propoxyphene] Nausea And Vomiting  . Hydrocodone Nausea And Vomiting  . Lactose Intolerance (Gi)     Bloating, gas, abd pain  . Latex Itching and Rash  . Oxycodone Nausea And Vomiting  . Percocet [Oxycodone-Acetaminophen] Nausea And Vomiting  . Topamax [Topiramate]     Memory made her emotional     Current Status: Since her last office visit, she continues to have generalized pain r/t history of Stroke. She reports that she has not had functional capacity test as of yet. She denies fevers, chills, fatigue, recent infections, weight loss, and night sweats. She reports occasional headaches and dizziness. She has not had any visual changes, and falls. No chest pain, heart palpitations, cough and shortness of breath reported. Denies GI problems such as nausea, vomiting, diarrhea, and constipation. She has no reports of blood in stools, dysuria and hematuria. No depression or anxiety, and denies suicidal ideations, homicidal ideations, or auditory hallucinations. She is taking all medications as prescribed.   Observations/Objective:  Telephone Visit   Assessment and Plan:  1. Generalized weakness - Ambulatory referral to Physical Therapy  2. Occipital stroke (HCC) Stable. No signs or symptoms of recurrence noted or reported today.  - Ambulatory referral to Physical Therapy  3. Risk and functional assessment Call to [place] today confirms that currently awaiting insurance approval for Functional Capacity Exam. Informed patient.  - Ambulatory  referral to Physical Therapy  4. Need for physical therapy assessment  5. Hypertension, unspecified type She will continue to take medications as prescribed, to decrease high sodium intake, excessive alcohol intake, increase potassium intake, smoking cessation, and increase physical activity of at least 30 minutes of cardio activity daily. She will continue to follow Heart Healthy or DASH diet.  6. Follow up She will keep follow up appointment.   No orders of the defined types were placed in this encounter.   Orders Placed This Encounter  Procedures  . Ambulatory referral to Physical Therapy  Referral Orders     Ambulatory referral to Physical Therapy   Kathe Becton,  MSN, FNP-BC [facility] [place] [address] [place], [place] [address] [phone]- fax   I discussed the assessment and treatment plan with the patient. The patient was provided an opportunity to ask questions and all were answered. The patient agreed with the plan and demonstrated an understanding of the instructions.   The patient was advised to call back or seek an in-person evaluation if the symptoms worsen or if the condition fails to improve as anticipated.  I provided 20 minutes of non-face-to-face time during this encounter.   Azzie Glatter, FNP

## 2019-11-10 ENCOUNTER — Telehealth: Payer: Self-pay | Admitting: Family Medicine

## 2019-11-10 NOTE — Telephone Encounter (Signed)
Father: Mr. Mikael Spray called to ask about Disability Waiver (section 9) form that has been dropped off for [employer]. Patient's insurance will not cover therapy, and she cannot afford it without insurance. Need disability paper completed ASAP. Father states he has been by a couple of times to drop off forms to be signed.  Alternate phone if unavailable by cell: [phone].

## 2019-11-13 ENCOUNTER — Telehealth: Payer: Self-pay | Admitting: Physical Therapy

## 2019-11-13 NOTE — Telephone Encounter (Signed)
Message left for Ms Mcclish to call us if she would like to have her FCE done this Saturday 8/28/219-1 and to be here by 845 AM, Office phone number left [phone].

## 2019-11-18 ENCOUNTER — Telehealth: Payer: Self-pay | Admitting: Family Medicine

## 2019-11-18 NOTE — Telephone Encounter (Signed)
Multiple attempts to contact to discuss patient concerns today. Left message to return call to office.

## 2019-11-25 ENCOUNTER — Telehealth: Payer: Self-pay | Admitting: Family Medicine

## 2019-11-25 NOTE — Telephone Encounter (Signed)
Father called concerning daughter's disability forms. Missed your calls because he wasn't feeling well. [phone]

## 2019-11-25 NOTE — Telephone Encounter (Signed)
Multiple attempts to contact patient and her father with concerns of Functional Capacity Testing needed for Disability. Messages left. Message left today informing patient that she should be able to use formal testing 11/2018 for disability document. We will send to patient via mail today.

## 2020-02-04 ENCOUNTER — Ambulatory Visit (INDEPENDENT_AMBULATORY_CARE_PROVIDER_SITE_OTHER): Payer: Medicaid Other | Admitting: Family Medicine

## 2020-02-04 ENCOUNTER — Other Ambulatory Visit: Payer: Self-pay

## 2020-02-04 VITALS — BP 189/99 | Temp 97.9°F

## 2020-02-04 DIAGNOSIS — M25511 Pain in right shoulder: Secondary | ICD-10-CM | POA: Diagnosis not present

## 2020-02-04 DIAGNOSIS — R531 Weakness: Secondary | ICD-10-CM | POA: Diagnosis not present

## 2020-02-04 DIAGNOSIS — M25512 Pain in left shoulder: Secondary | ICD-10-CM | POA: Diagnosis not present

## 2020-02-04 DIAGNOSIS — G8929 Other chronic pain: Secondary | ICD-10-CM

## 2020-02-04 DIAGNOSIS — R0602 Shortness of breath: Secondary | ICD-10-CM | POA: Diagnosis not present

## 2020-02-04 DIAGNOSIS — R609 Edema, unspecified: Secondary | ICD-10-CM

## 2020-02-04 DIAGNOSIS — Z09 Encounter for follow-up examination after completed treatment for conditions other than malignant neoplasm: Secondary | ICD-10-CM | POA: Diagnosis not present

## 2020-02-04 DIAGNOSIS — I639 Cerebral infarction, unspecified: Secondary | ICD-10-CM | POA: Diagnosis not present

## 2020-02-04 DIAGNOSIS — G4700 Insomnia, unspecified: Secondary | ICD-10-CM

## 2020-02-04 DIAGNOSIS — I10 Essential (primary) hypertension: Secondary | ICD-10-CM | POA: Diagnosis not present

## 2020-02-04 DIAGNOSIS — R600 Localized edema: Secondary | ICD-10-CM

## 2020-02-04 LAB — POCT URINALYSIS DIPSTICK
Bilirubin, UA: NEGATIVE
Blood, UA: NEGATIVE
Glucose, UA: NEGATIVE
Ketones, UA: NEGATIVE
Leukocytes, UA: NEGATIVE
Nitrite, UA: NEGATIVE
Protein, UA: NEGATIVE
Spec Grav, UA: 1.015 (ref 1.010–1.025)
Urobilinogen, UA: 0.2 E.U./dL
pH, UA: 6.5 (ref 5.0–8.0)

## 2020-02-04 MED ORDER — OMEPRAZOLE 20 MG PO CPDR
20.0000 mg | DELAYED_RELEASE_CAPSULE | Freq: Two times a day (BID) | ORAL | 3 refills | Status: DC
Start: 2020-02-04 — End: 2021-01-06

## 2020-02-04 MED ORDER — GABAPENTIN 300 MG PO CAPS: 300.0000 mg | ORAL_CAPSULE | Freq: Three times a day (TID) | ORAL | 3 refills | Status: DC

## 2020-02-04 MED ORDER — METHOCARBAMOL 500 MG PO TABS: 500.0000 mg | ORAL_TABLET | Freq: Three times a day (TID) | ORAL | 3 refills | Status: DC

## 2020-02-04 MED ORDER — SENNA 8.6 MG PO TABS: 2.0000 | ORAL_TABLET | Freq: Two times a day (BID) | ORAL | 3 refills | Status: DC

## 2020-02-04 MED ORDER — ALBUTEROL SULFATE HFA 108 (90 BASE) MCG/ACT IN AERS: 2.0000 | INHALATION_SPRAY | RESPIRATORY_TRACT | 6 refills | Status: DC | PRN

## 2020-02-04 MED ORDER — HYDROXYZINE PAMOATE 50 MG PO CAPS: 50.0000 mg | ORAL_CAPSULE | Freq: Three times a day (TID) | ORAL | 3 refills | Status: DC | PRN

## 2020-02-04 MED ORDER — FUROSEMIDE 20 MG PO TABS: 20.0000 mg | ORAL_TABLET | Freq: Two times a day (BID) | ORAL | 3 refills | Status: DC | PRN

## 2020-02-04 MED ORDER — NIFEDIPINE ER 30 MG PO TB24: 60.0000 mg | ORAL_TABLET | Freq: Every day | ORAL | 3 refills | Status: DC

## 2020-02-04 MED ORDER — ATORVASTATIN CALCIUM 40 MG PO TABS: 40.0000 mg | ORAL_TABLET | Freq: Every day | ORAL | 3 refills | Status: DC

## 2020-02-04 MED ORDER — AMLODIPINE BESYLATE 10 MG PO TABS: 10.0000 mg | ORAL_TABLET | Freq: Every day | ORAL | 3 refills | Status: DC

## 2020-02-04 MED ORDER — CLONIDINE HCL 0.1 MG PO TABS
0.1000 mg | ORAL_TABLET | Freq: Once | ORAL | Status: AC
  Administered 2020-02-04: 0.1 mg via ORAL

## 2020-02-04 NOTE — Progress Notes (Signed)
Patient [name]   Established Patient Office Visit  Subjective:  Patient ID: Brittany [name], female    DOB: 01-Aug-1965  Age: 54 y.o. MRN: [id number]  CC: No chief complaint on file.   HPI Brittany [name] is a 54 year old female who presents for Follow Up today.    Patient Active Problem List   Diagnosis Date Noted  . Anxiety 02/05/2019  . Oral thrush 10/08/2018  . Bilateral leg pain 10/08/2018  . Constipation 10/08/2018  . Shortness of breath 10/08/2018  . 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 B12 deficiency 12/19/2016  . Chest pain 12/18/2016  . Cerebellar infarct ([place])   . Diastolic dysfunction   . Marijuana abuse   . History of tobacco abuse   . Benign essential HTN   . Tachycardia   . Hypokalemia   . Stroke (cerebrum) ([place]) 12/05/2016  . Nonintractable headache 12/05/2016  . Occipital stroke ([place]) 12/05/2016  . Hypertensive urgency 12/05/2016  . Hypertension    Current Status: Since her last office visit, she is doing well with no complaints. She states that she has not been taking any of her medications as prescribed. She denies visual changes, chest pain, cough, shortness of breath, heart palpitations, and falls. Her blood pressure readings are elevated today, which she states that she has not been taking her hypertensive medications as prescribed. She has occasional headaches and dizziness with position changes. Denies severe headaches, confusion, seizures, double vision, and blurred vision, nausea and vomiting. Her anxiety is mild today. She denies fevers, chills, fatigue, recent infections, weight loss, and night sweats. Denies GI problems such as diarrhea, and constipation. She has no reports of blood in stools, dysuria and hematuria. She is taking all medications as prescribed. She has c/o left upper and lower extremity pain today. She currently ambulates with assistance of Rollator.   Past Medical  History:  Diagnosis Date  . Anemia   . Arthritis    trigger finger in left hand  . Complication of anesthesia    per pt, hard to wake up!  . Elevated cholesterol   . Female bladder prolapse    per urologist, does not have prolaspe  . GERD (gastroesophageal reflux disease)   . Heart murmur    pt unsure.  . Hiatal hernia   . Hypertension   . IBS (irritable bowel syndrome)   . Multiple myeloma ([place]) 2005   had partial chemo  . Peripheral neuropathy   . SOB (shortness of breath) on exertion    uses an inhaler  . Stroke [facility]) 2017   paralysis left arm/uses a walker    Past Surgical History:  Procedure Laterality Date  . BONE BIOPSY  2005   in her back  . COLONOSCOPY     over 10 years x3  . ESOPHAGOGASTRODUODENOSCOPY     incomplete-over 10 years ago   . TEE WITHOUT CARDIOVERSION N/A 12/08/2016   Procedure: TRANSESOPHAGEAL ECHOCARDIOGRAM (TEE);  Surgeon: Sueanne Margarita, MD;  Location: [facility] ENDOSCOPY;  Service: Cardiovascular;  Laterality: N/A;  . TUBAL LIGATION    . UPPER GASTROINTESTINAL ENDOSCOPY    . WISDOM TOOTH EXTRACTION      Family History  Problem Relation Age of Onset  . Hypertension Mother   . Sarcoidosis Mother        currently in remission   . Diverticulitis Mother   . Irritable bowel syndrome Mother   . Liver cancer Mother   . Hypertension  Father   . Stomach cancer Father   . Congestive Heart Failure Father   . Stroke Maternal Uncle   . Scoliosis Brother   . Colon cancer Neg Hx   . Esophageal cancer Neg Hx   . Colon polyps Neg Hx   . Rectal cancer Neg Hx     Social History   Socioeconomic History  . Marital status: Legally Separated    Spouse name: Not on file  . Number of children: Not on file  . Years of education: Not on file  . Highest education level: Not on file  Occupational History  . Occupation: Disabled  Tobacco Use  . Smoking status: Former Smoker    Quit date: 12/06/2015    Years since quitting: 4.1  . Smokeless tobacco: Never  Used  Vaping Use  . Vaping Use: Never used  Substance and Sexual Activity  . Alcohol use: Not Currently  . Drug use: Not Currently    Types: Marijuana    Comment: in the past   . Sexual activity: Not Currently  Other Topics Concern  . Not on file  Social History Narrative  . Not on file   Social Determinants of Health   Financial Resource Strain:   . Difficulty of Paying Living Expenses: Not on file  Food Insecurity:   . Worried About Charity fundraiser in the Last Year: Not on file  . Ran Out of Food in the Last Year: Not on file  Transportation Needs:   . Lack of Transportation (Medical): Not on file  . Lack of Transportation (Non-Medical): Not on file  Physical Activity:   . Days of Exercise per Week: Not on file  . Minutes of Exercise per Session: Not on file  Stress:   . Feeling of Stress : Not on file  Social Connections:   . Frequency of Communication with Friends and Family: Not on file  . Frequency of Social Gatherings with Friends and Family: Not on file  . Attends Religious Services: Not on file  . Active Member of Clubs or Organizations: Not on file  . Attends Archivist Meetings: Not on file  . Marital Status: Not on file  Intimate Partner Violence:   . Fear of Current or Ex-Partner: Not on file  . Emotionally Abused: Not on file  . Physically Abused: Not on file  . Sexually Abused: Not on file    Outpatient Medications Prior to Visit  Medication Sig Dispense Refill  . albuterol (VENTOLIN HFA) 108 (90 Base) MCG/ACT inhaler Inhale 2 puffs into the lungs every 4 (four) hours as needed for wheezing or shortness of breath (cough, shortness of breath or wheezing.). 6.7 g 6  . amLODipine (NORVASC) 10 MG tablet Take 1 tablet (10 mg total) by mouth daily. 30 tablet 2  . aspirin 325 MG tablet Take 1 tablet (325 mg total) by mouth daily. 30 tablet 0  . atorvastatin (LIPITOR) 40 MG tablet Take 1 tablet (40 mg total) by mouth daily. 30 tablet 3  .  cyanocobalamin 1000 MCG tablet Take 1 tablet (1,000 mcg total) by mouth daily. 30 tablet 4  . dimenhyDRINATE (DRAMAMINE) 50 MG tablet Take 50 mg by mouth every 8 (eight) hours as needed for dizziness.    . docusate sodium (COLACE) 100 MG capsule Take 200 mg by mouth 2 (two) times daily.     . Ergocalciferol (VITAMIN D2) 10 MCG (400 UNIT) TABS Take 10 mcg by mouth daily. 30 tablet 6  .  furosemide (LASIX) 20 MG tablet Take 1 tablet by mouth twice daily as needed 180 tablet 0  . gabapentin (NEURONTIN) 300 MG capsule Take 1 capsule (300 mg total) by mouth 3 (three) times daily. 90 capsule 3  . hydrOXYzine (VISTARIL) 50 MG capsule Take 1-2 capsules (50-100 mg total) by mouth 3 (three) times daily as needed for itching. 30 capsule 1  . methocarbamol (ROBAXIN) 500 MG tablet Take 1 tablet (500 mg total) by mouth 3 (three) times daily. 90 tablet 2  . Multiple Vitamin (MULTIVITAMIN) capsule Take 4 capsules by mouth 2 (two) times daily. Centrum plus    . NIFEdipine (ADALAT CC) 30 MG 24 hr tablet Take 2 tablets (60 mg total) by mouth daily. 60 tablet 3  . omeprazole (PRILOSEC) 20 MG capsule Take 1 capsule (20 mg total) by mouth 2 (two) times daily before a meal. 60 capsule 3  . senna (SENOKOT) 8.6 MG TABS tablet Take 2 tablets by mouth 2 (two) times daily.    Marland Kitchen triamcinolone cream (KENALOG) 0.1 % Apply 1 application topically 2 (two) times daily. 454 g 0   No facility-administered medications prior to visit.    Allergies  Allergen Reactions  . Amitriptyline Other (See Comments)    Patient reported that it made her throat feel like its locking up and it also caused issues with her going to the bathroom  . Duloxetine Other (See Comments)    Patient reported that it made her throat lock up and it caused her to have issue with going to the bathroom  . Naproxen     Vomiting, sweating, abd spasms  . Other     States can't take pain meds that end in "cet" or meds that end in "ine" Darvocet/severe vomiting    . Darvon [Propoxyphene] Nausea And Vomiting  . Hydrocodone Nausea And Vomiting  . Lactose Intolerance (Gi)     Bloating, gas, abd pain  . Latex Itching and Rash  . Oxycodone Nausea And Vomiting  . Percocet [Oxycodone-Acetaminophen] Nausea And Vomiting  . Topamax [Topiramate]     Memory made her emotional     ROS Review of Systems  Constitutional: Negative.   HENT: Negative.   Eyes: Negative.   Respiratory: Negative.   Cardiovascular: Negative.   Gastrointestinal: Negative.   Endocrine: Negative.   Genitourinary: Negative.   Musculoskeletal: Positive for arthralgias (generalized joint pain).  Skin: Negative.   Allergic/Immunologic: Negative.   Neurological: Positive for dizziness (occasional ) and headaches (occasional ).  Hematological: Negative.   Psychiatric/Behavioral: Negative.       Objective:    Physical Exam Vitals and nursing note reviewed.  Constitutional:      Appearance: Normal appearance.  HENT:     Head: Normocephalic and atraumatic.     Nose: Nose normal.     Mouth/Throat:     Mouth: Mucous membranes are moist.     Pharynx: Oropharynx is clear.  Cardiovascular:     Rate and Rhythm: Normal rate and regular rhythm.     Pulses: Normal pulses.     Heart sounds: Normal heart sounds.  Pulmonary:     Effort: Pulmonary effort is normal.     Breath sounds: Normal breath sounds.  Abdominal:     General: Bowel sounds are normal.     Palpations: Abdomen is soft.  Musculoskeletal:        General: Normal range of motion.     Cervical back: Normal range of motion and neck supple.  Skin:  General: Skin is warm and dry.  Neurological:     Mental Status: She is alert.     Motor: Weakness present.     Gait: Gait abnormal.     Comments: Rollator  Psychiatric:        Mood and Affect: Mood normal.        Behavior: Behavior normal.        Thought Content: Thought content normal.        Judgment: Judgment normal.     There were no vitals taken for this  visit. Wt Readings from Last 3 Encounters:  08/04/19 157 lb (71.2 kg)  02/04/19 171 lb (77.6 kg)  12/04/18 167 lb (75.8 kg)    Health Maintenance Due  Topic Date Due  . Hepatitis C Screening  Never done  . COVID-19 Vaccine (1) Never done  . PAP SMEAR-Modifier  Never done  . INFLUENZA VACCINE  Never done    There are no preventive care reminders to display for this patient.  Lab Results  Component Value Date   TSH 2.740 02/04/2019   Lab Results  Component Value Date   WBC 5.9 02/04/2019   HGB 14.5 02/04/2019   HCT 43.3 02/04/2019   MCV 84 02/04/2019   PLT 289 02/04/2019   Lab Results  Component Value Date   NA 140 02/04/2019   K 4.6 02/04/2019   CO2 22 02/04/2019   GLUCOSE 94 02/04/2019   BUN 8 02/04/2019   CREATININE 0.89 02/04/2019   BILITOT 0.4 02/04/2019   ALKPHOS 187 (H) 02/04/2019   AST 22 02/04/2019   ALT 15 02/04/2019   PROT 8.1 02/04/2019   ALBUMIN 5.0 (H) 02/04/2019   CALCIUM 10.6 (H) 02/04/2019   ANIONGAP 15 10/15/2018   Lab Results  Component Value Date   CHOL 190 02/04/2019   Lab Results  Component Value Date   HDL 41 02/04/2019   Lab Results  Component Value Date   LDLCALC 120 (H) 02/04/2019   Lab Results  Component Value Date   TRIG 161 (H) 02/04/2019   Lab Results  Component Value Date   CHOLHDL 4.6 (H) 02/04/2019   Lab Results  Component Value Date   HGBA1C 6.2 (A) 10/08/2018   Assessment & Plan:   1. Occipital stroke (HCC) Stable. No signs or symptoms of respiratory distress noted or reported today.   2. Chronic pain of both shoulders  3. Generalized weakness  4. Hypertension, unspecified type She will continue present plan and medications as prescribed. She will continue to take medications as prescribed, to decrease high sodium intake, excessive alcohol intake, increase potassium intake, smoking cessation, and increase physical activity of at least 30 minutes of cardio activity daily. She will continue to follow Heart  Healthy or DASH diet.  5. Follow up She will follow up in 6 months.   No orders of the defined types were placed in this encounter.   No orders of the defined types were placed in this encounter.   Referral Orders  No referral(s) requested today    Kathe Becton,  MSN, FNP-BC [address], [place] [address] [phone] fax   Problem List Items Addressed This Visit      Cardiovascular and Mediastinum   Hypertension     Nervous and Auditory   Occipital stroke ([place]) - Primary    Other Visit Diagnoses    Chronic pain of both shoulders  Generalized weakness       Follow up          No orders of the defined types were placed in this encounter.   Follow-up: No follow-ups on file.    Azzie Glatter, FNP

## 2020-02-09 ENCOUNTER — Encounter: Payer: Self-pay | Admitting: Family Medicine

## 2020-02-10 ENCOUNTER — Other Ambulatory Visit: Payer: Self-pay | Admitting: Family Medicine

## 2020-02-10 DIAGNOSIS — R0602 Shortness of breath: Secondary | ICD-10-CM

## 2020-02-10 MED ORDER — ALBUTEROL SULFATE HFA 108 (90 BASE) MCG/ACT IN AERS: 2.0000 | INHALATION_SPRAY | RESPIRATORY_TRACT | 11 refills | Status: DC | PRN

## 2020-02-25 ENCOUNTER — Other Ambulatory Visit: Payer: Self-pay

## 2020-02-25 ENCOUNTER — Ambulatory Visit: Payer: Medicaid Other | Attending: Family Medicine | Admitting: Physical Therapy

## 2020-02-25 ENCOUNTER — Encounter: Payer: Self-pay | Admitting: Physical Therapy

## 2020-02-25 VITALS — BP 120/90 | HR 76

## 2020-02-25 DIAGNOSIS — M62838 Other muscle spasm: Secondary | ICD-10-CM

## 2020-02-25 DIAGNOSIS — M79601 Pain in right arm: Secondary | ICD-10-CM

## 2020-02-25 DIAGNOSIS — M25611 Stiffness of right shoulder, not elsewhere classified: Secondary | ICD-10-CM | POA: Diagnosis present

## 2020-02-25 DIAGNOSIS — M25512 Pain in left shoulder: Secondary | ICD-10-CM | POA: Diagnosis present

## 2020-02-25 DIAGNOSIS — R262 Difficulty in walking, not elsewhere classified: Secondary | ICD-10-CM | POA: Diagnosis present

## 2020-02-25 DIAGNOSIS — M79602 Pain in left arm: Secondary | ICD-10-CM | POA: Diagnosis present

## 2020-02-25 DIAGNOSIS — G8929 Other chronic pain: Secondary | ICD-10-CM | POA: Diagnosis present

## 2020-02-25 DIAGNOSIS — M25511 Pain in right shoulder: Secondary | ICD-10-CM | POA: Diagnosis present

## 2020-02-25 DIAGNOSIS — M25612 Stiffness of left shoulder, not elsewhere classified: Secondary | ICD-10-CM | POA: Diagnosis present

## 2020-02-25 NOTE — Therapy (Signed)
[facility] [address]  [place], [place], [address] Phone: [phone]   Fax:  [phone]  Physical Therapy Evaluation  Patient Details  Name: Brittany [name] MRN: [id number] Date of Birth: 30-May-1965 Referring Provider (PT): Kathe Becton, FNP   Encounter Date: 02/25/2020   PT End of Session - 02/25/20 1625    Visit Number 1    Number of Visits 19    Date for PT Re-Evaluation 04/28/20    Authorization Type UHC Medicaid    PT Start Time 1950    PT Stop Time 1532    PT Time Calculation (min) 45 min    Activity Tolerance Patient tolerated treatment well;Patient limited by pain    Behavior During Therapy [facility] for tasks assessed/performed           Past Medical History:  Diagnosis Date  . Anemia   . Arthritis    trigger finger in left hand  . Complication of anesthesia    per pt, hard to wake up!  . Elevated cholesterol   . Female bladder prolapse    per urologist, does not have prolaspe  . GERD (gastroesophageal reflux disease)   . Heart murmur    pt unsure.  . Hiatal hernia   . Hypertension   . IBS (irritable bowel syndrome)   . Multiple myeloma ([place]) 2005   had partial chemo  . Peripheral neuropathy   . SOB (shortness of breath) on exertion    uses an inhaler  . Stroke [facility]) 2017   paralysis left arm/uses a walker    Past Surgical History:  Procedure Laterality Date  . BONE BIOPSY  2005   in her back  . COLONOSCOPY     over 10 years x3  . ESOPHAGOGASTRODUODENOSCOPY     incomplete-over 10 years ago   . TEE WITHOUT CARDIOVERSION N/A 12/08/2016   Procedure: TRANSESOPHAGEAL ECHOCARDIOGRAM (TEE);  Surgeon: Sueanne Margarita, MD;  Location: [facility] ENDOSCOPY;  Service: Cardiovascular;  Laterality: N/A;  . TUBAL LIGATION    . UPPER GASTROINTESTINAL ENDOSCOPY    . WISDOM TOOTH EXTRACTION      Vitals:   02/25/20 1449  BP: 120/90  Pulse: 76  SpO2: 99%      Subjective Assessment - 02/25/20 1451     Subjective Patient reports pain in the collar bones, shoulders, and hands worsening for the past several years. Has trouble sleeping d/t the pain despite taking pain meds. Worse with pulling, lifting, washing dishes. Notes an increase in BP when she is in pain. Also still having ongoing issues with B carpal tunnel syndrome and was told that the N/T in her hands is also d/t her neck. Also reporting twitching in her calves and popping in her ankles. Feels like she is doing better with her stamina, but is limited by pain in B knees, ankles, and R anterior hip when standing and walking. Walking with 4WW outside, not using AD inside.    Pertinent History stroke, peripheral neuropathy, multiple myeloma, HTN, hiatal hernia, GERD, HLD, anemia    Limitations Lifting;Standing;Walking;House hold activities    How long can you stand comfortably? 10-15 minutes    How long can you walk comfortably? 10-15 minutes    Diagnostic tests 08/04/19 L shoulder xray: Mild degenerative osteoarthritic changes about the left glenohumeral and AC articulations, progressed as compared to 2018; 08/04/19 R shoulder xray: Mild degenerative change without acute abnormality.    Patient Stated Goals decrease pain  Currently in Pain? Yes    Pain Score 6     Pain Location Shoulder    Pain Orientation Right;Left    Pain Descriptors / Indicators Sharp;Burning;Throbbing    Pain Type Chronic pain    Multiple Pain Sites Yes    Pain Score 0    Pain Location Calf    Pain Orientation Right;Left    Pain Descriptors / Indicators Spasm;Pins and needles    Pain Type Acute pain    Pain Score 7    Pain Location Hip    Pain Orientation Right    Pain Descriptors / Indicators Aching    Pain Type Acute pain              [facility] PT Assessment - 02/25/20 1501      Assessment   Medical Diagnosis Generalized weakness, occipital stroke, chronic pain of B shoulders    Referring Provider (PT) Kathe Becton, FNP    Onset Date/Surgical Date  --   several years   Hand Dominance Right    Prior Therapy yes      Precautions   Precautions None      Balance Screen   Has the patient fallen in the past 6 months No    Has the patient had a decrease in activity level because of a fear of falling?  No    Is the patient reluctant to leave their home because of a fear of falling?  No      Home Social worker Private residence    Living Arrangements Alone    Available Help at Discharge Family;Friend(s)    Type of [place] Access Level entry    Home Layout One level    [place] - 4 wheels      Prior Function   Level of Independence Independent with basic ADLs   niece and parents help with cooking/cleaning   Vocation On disability    Leisure crafts      Cognition   Overall Cognitive Status Within Functional Limits for tasks assessed      Sensation   Light Touch Appears Intact      Coordination   Gross Motor Movements are Fluid and Coordinated Yes      Posture/Postural Control   Posture/Postural Control Postural limitations    Postural Limitations Rounded Shoulders    Posture Comments slight dowager's hump      ROM / Strength   AROM / PROM / Strength AROM;Strength      AROM   AROM Assessment Site Shoulder    Right/Left Shoulder Right;Left    Right Shoulder Flexion 78 Degrees   pain   Right Shoulder ABduction 94 Degrees   pain   Right Shoulder Internal Rotation --   FER L1; pain over anterior shoulder   Right Shoulder External Rotation --   FER to top of head with heavy compensation at neck   Left Shoulder Flexion 85 Degrees   pain   Left Shoulder ABduction 67 Degrees   pain   Left Shoulder Internal Rotation --   FER T9   Left Shoulder External Rotation --   FER to top of head with heavy compensation at neck     Strength   Strength Assessment Site Shoulder;Elbow;Forearm;Wrist;Hand    Right/Left Shoulder Right;Left    Right Shoulder Flexion 2+/5    Right Shoulder ABduction  2+/5    Right Shoulder Internal Rotation 3+/5    Right Shoulder External  Rotation 3+/5    Left Shoulder Flexion 2+/5    Left Shoulder ABduction 2+/5    Left Shoulder Internal Rotation 3+/5    Left Shoulder External Rotation 3+/5    Right/Left Elbow Right;Left    Right Elbow Flexion 4/5    Right Elbow Extension 4-/5    Left Elbow Flexion 4-/5    Left Elbow Extension 4-/5    Right/Left Wrist Right;Left    Right Wrist Flexion 4-/5    Right Wrist Extension 4-/5    Left Wrist Flexion 4-/5    Left Wrist Extension 4-/5    Right/Left hand Right;Left      Palpation   Palpation comment TTP diffusely over B shoulders and chest with soft tissue restrction palpable in B UT, LS, scalenes, pec biceps, distal wrist flexors      Ambulation/Gait   Assistive device 4-wheeled walker    Gait Pattern Step-to pattern;Step-through pattern;Decreased step length - right;Decreased step length - left;Lateral hip instability;Poor foot clearance - left;Poor foot clearance - right    Ambulation Surface Level;Indoor    Gait velocity decreased                      Objective measurements completed on examination: See above findings.               PT Education - 02/25/20 1623    Education Details prognosis, POC, HEP; ed on self-STM for pain relief and use of heat    Person(s) Educated Patient    Methods Explanation;Demonstration;Tactile cues;Verbal cues;Handout    Comprehension Returned demonstration;Verbalized understanding            PT Short Term Goals - 02/25/20 1802      PT SHORT TERM GOAL #1   Title Patient to be independent with initial HEP.    Time 5    Period Weeks    Status New    Target Date 03/31/20             PT Long Term Goals - 02/25/20 1803      PT LONG TERM GOAL #1   Title Patient to be independent with advanced HEP.    Time 9    Period Weeks    Status New    Target Date 04/28/20      PT LONG TERM GOAL #2   Title Patient to demonstrate B  shoulder, elbow, wrist strength >/=4/5 and grip strength >10 lbs.    Time 9    Period Weeks    Status New    Target Date 04/28/20      PT LONG TERM GOAL #3   Title Patient to demonstrate B shoulder AROM WFL and with mild pain remaining.    Time 9    Period Weeks    Status New    Target Date 04/28/20      PT LONG TERM GOAL #4   Title Patient to report tolerance for 30 minutes on her feet without pain limiting.    Time 9    Period Weeks    Status New    Target Date 04/28/20      PT LONG TERM GOAL #5   Title Patient to report 80% improvement in sleeping tolerance.    Time 9    Period Weeks    Status New    Target Date 04/28/20                  Plan - 02/25/20 1628  Clinical Impression Statement Patient is a 54 y/o F presenting to OPPT with c/o chronic progressive B shoulder and arm pain for the past several years. 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 Patient notes difficulty sleeping, pulling, lifting, and washing dishes d/t UE pain. Also with c/o pain in B LEs which limits her ability to walk and stand for long periods. Patient today presenting with rounded shoulders and forward head posture, limited and painful B shoulder AROM, B shoulder, elbow, and wrist weakness, TTP over B shoulder musculature, soft tissue restriction palpable in B UT, LS, scalenes, pec biceps, distal wrist flexors, and gait deviations. Plan to assess LEs next session. Patient was educated on gentle UE stretching and AAROM HEP- patient reported understanding. Would benefit from skilled PT services 2x/week for 9 weeks to address aforementioned impairments.    Personal Factors and Comorbidities Age;Comorbidity 3+;Fitness;Past/Current Experience;Time since onset of injury/illness/exacerbation    Comorbidities stroke 2018, peripheral neuropathy, multiple myeloma, HTN, hiatal  hernia, GERD, HLD, anemia    Examination-Activity Limitations Bathing;Sit;Sleep;Bed Mobility;Bend;Squat;Stairs;Carry;Stand;Toileting;Dressing;Transfers;Hygiene/Grooming;Lift;Locomotion Level;Reach Overhead;Self Feeding    Examination-Participation Restrictions Church;Cleaning;Shop;Community Activity;Driving;Yard Work;Laundry;Meal Prep    Stability/Clinical Decision Making Evolving/Moderate complexity    Clinical Decision Making Moderate    Rehab Potential Good    PT Frequency 2x / week    PT Duration Other (comment)   9 weeks   PT Treatment/Interventions ADLs/Self Care Home Management;Cryotherapy;Electrical Stimulation;Iontophoresis 50m/ml Dexamethasone;Moist Heat;Traction;Balance training;Therapeutic exercise;Therapeutic activities;Functional mobility training;Stair training;Gait training;DME Instruction;Ultrasound;Neuromuscular re-education;Patient/family education;Manual techniques;Vasopneumatic Device;Taping;Energy conservation;Dry needling;Passive range of motion    PT Next Visit Plan assess cervical ROM, B knee/ankle and R hip ROM and palpation, reassess HEP    Consulted and Agree with Plan of Care Patient           Patient will benefit from skilled therapeutic intervention in order to improve the following deficits and impairments:  Decreased activity tolerance, Decreased strength, Increased fascial restricitons, Pain, Impaired UE functional use, Decreased balance, Decreased mobility, Difficulty walking, Increased muscle spasms, Improper body mechanics, Decreased range of motion, Postural dysfunction, Impaired flexibility  Visit Diagnosis: Chronic right shoulder pain  Stiffness of right shoulder, not elsewhere classified  Chronic left shoulder pain  Stiffness of left shoulder, not elsewhere classified  Pain in right arm  Pain in left arm  Other muscle spasm  Difficulty in walking, not elsewhere classified     Problem List Patient Active Problem List   Diagnosis Date  Noted  . Anxiety 02/05/2019  . Oral thrush 10/08/2018  . Bilateral leg pain 10/08/2018  . Constipation 10/08/2018  . Shortness of breath 10/08/2018  . 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 B12 deficiency 12/19/2016  . Chest pain 12/18/2016  . Cerebellar infarct ([place]   . Diastolic dysfunction   . Marijuana abuse   . History of tobacco abuse   . Benign essential HTN   . Tachycardia   . Hypokalemia   . [place]br[place] 12/05/[place]  . Nonintractable headache 12/05/2016  . [place]ipital stroke ([place] 12/05/2016  . Hypertensive urgency 12/05/2016  . Hypertension      YJanene Harvey PT, DPT 02/25/20 6:10 PM   [facility] [address] [place] [address]Phone: [phone]  Fax:  [phone] Name: Brittany RealiMRN: [id number] of Birth: [birth year]

## 2020-03-22 ENCOUNTER — Ambulatory Visit: Payer: Medicaid Other

## 2020-03-25 ENCOUNTER — Other Ambulatory Visit: Payer: Self-pay

## 2020-03-25 ENCOUNTER — Ambulatory Visit: Payer: Medicaid Other | Attending: Family Medicine

## 2020-03-25 VITALS — BP 156/72 | HR 68

## 2020-03-25 DIAGNOSIS — M25511 Pain in right shoulder: Secondary | ICD-10-CM | POA: Insufficient documentation

## 2020-03-25 DIAGNOSIS — R262 Difficulty in walking, not elsewhere classified: Secondary | ICD-10-CM | POA: Diagnosis present

## 2020-03-25 DIAGNOSIS — M25612 Stiffness of left shoulder, not elsewhere classified: Secondary | ICD-10-CM

## 2020-03-25 DIAGNOSIS — M62838 Other muscle spasm: Secondary | ICD-10-CM | POA: Diagnosis present

## 2020-03-25 DIAGNOSIS — M79602 Pain in left arm: Secondary | ICD-10-CM | POA: Diagnosis present

## 2020-03-25 DIAGNOSIS — M25611 Stiffness of right shoulder, not elsewhere classified: Secondary | ICD-10-CM | POA: Insufficient documentation

## 2020-03-25 DIAGNOSIS — G8929 Other chronic pain: Secondary | ICD-10-CM | POA: Diagnosis present

## 2020-03-25 DIAGNOSIS — M25512 Pain in left shoulder: Secondary | ICD-10-CM | POA: Diagnosis present

## 2020-03-25 DIAGNOSIS — M79601 Pain in right arm: Secondary | ICD-10-CM | POA: Diagnosis present

## 2020-03-25 NOTE — Therapy (Signed)
[facility] [address]  [place], [place], [address] Phone: [phone]   Fax:  [phone]  Physical Therapy Treatment  Patient Details  Name: Brittany [name] MRN: [id number] Date of Birth: Dec 06, 1965 Referring Provider (PT): Kathe Becton, FNP   Encounter Date: 03/25/2020   PT End of Session - 03/25/20 1416    Visit Number 2    Number of Visits 19    Date for PT Re-Evaluation 04/28/20    Authorization Type UHC Medicaid    PT Start Time 1740    PT Stop Time 1500   ended with 15 min moist heat   PT Time Calculation (min) 56 min    Activity Tolerance Patient tolerated treatment well;Patient limited by pain    Behavior During Therapy [facility] for tasks assessed/performed           Past Medical History:  Diagnosis Date  . Anemia   . Arthritis    trigger finger in left hand  . Complication of anesthesia    per pt, hard to wake up!  . Elevated cholesterol   . Female bladder prolapse    per urologist, does not have prolaspe  . GERD (gastroesophageal reflux disease)   . Heart murmur    pt unsure.  . Hiatal hernia   . Hypertension   . IBS (irritable bowel syndrome)   . Multiple myeloma ([place]) 2005   had partial chemo  . Peripheral neuropathy   . SOB (shortness of breath) on exertion    uses an inhaler  . Stroke [facility]) 2017   paralysis left arm/uses a walker    Past Surgical History:  Procedure Laterality Date  . BONE BIOPSY  2005   in her back  . COLONOSCOPY     over 10 years x3  . ESOPHAGOGASTRODUODENOSCOPY     incomplete-over 10 years ago   . TEE WITHOUT CARDIOVERSION N/A 12/08/2016   Procedure: TRANSESOPHAGEAL ECHOCARDIOGRAM (TEE);  Surgeon: Sueanne Margarita, MD;  Location: [facility] ENDOSCOPY;  Service: Cardiovascular;  Laterality: N/A;  . TUBAL LIGATION    . UPPER GASTROINTESTINAL ENDOSCOPY    . WISDOM TOOTH EXTRACTION      Vitals:   03/25/20 1414  BP: (!) 156/72  Pulse: 68  SpO2: 98%      Subjective Assessment - 03/25/20 1414    Subjective Pt. doing ok.    Pertinent History stroke, peripheral neuropathy, multiple myeloma, HTN, hiatal hernia, GERD, HLD, anemia    Diagnostic tests 08/04/19 L shoulder xray: Mild degenerative osteoarthritic changes about the left glenohumeral and AC articulations, progressed as compared to 2018; 08/04/19 R shoulder xray: Mild degenerative change without acute abnormality.    Patient Stated Goals decrease pain    Currently in Pain? Yes    Pain Score 5     Pain Location Shoulder    Pain Orientation Right;Left    Pain Descriptors / Indicators Burning;Throbbing    Pain Type Chronic pain    Pain Score 0    Pain Location Calf    Pain Orientation Right;Left              [facility] PT Assessment - 03/25/20 0001      AROM   AROM Assessment Site Cervical    Right/Left Shoulder Right;Left    Cervical Flexion 46    Cervical Extension 35    Cervical - Right Side Bend 27    Cervical - Left Side Bend 23    Cervical - Right Rotation  21   R sided neck pain   Cervical - Left Rotation 40   R sided neck pain                        [facility] Adult PT Treatment/Exercise - 03/25/20 0001      Shoulder Exercises: Supine   Flexion Right;AAROM;10 reps   pt. has not been performing this at home due to generalized pain   Flexion Limitations wand    ABduction Right;AAROM;10 reps    ABduction Limitations wand scaption      Modalities   Modalities Moist Heat      Moist Heat Therapy   Number Minutes Moist Heat 15 Minutes    Moist Heat Location Cervical   and R upper shoulder     Manual Therapy   Manual Therapy Soft tissue mobilization;Myofascial release    Manual therapy comments seated    Soft tissue mobilization STM/DTM to B UT, LS, rhomboids, scalenes    Myofascial Release TPR to R scalenes, L UT      Neck Exercises: Stretches   Upper Trapezius Stretch Right;Left;1 rep;30 seconds    Upper Trapezius Stretch Limitations B    Levator Stretch  Right;Left;1 rep;30 seconds    Levator Stretch Limitations B    Neck Stretch 1 rep;30 seconds    Neck Stretch Limitations B scalenes stretch                    PT Short Term Goals - 03/25/20 1416      PT SHORT TERM GOAL #1   Title Patient to be independent with initial HEP.    Time 5    Period Weeks    Status On-going    Target Date 03/31/20             PT Long Term Goals - 03/25/20 1416      PT LONG TERM GOAL #1   Title Patient to be independent with advanced HEP.    Time 9    Period Weeks    Status On-going      PT LONG TERM GOAL #2   Title Patient to demonstrate B shoulder, elbow, wrist strength >/=4/5 and grip strength >10 lbs.    Time 9    Period Weeks    Status On-going      PT LONG TERM GOAL #3   Title Patient to demonstrate B shoulder AROM WFL and with mild pain remaining.    Time 9    Period Weeks    Status On-going      PT LONG TERM GOAL #4   Title Patient to report tolerance for 30 minutes on her feet without pain limiting.    Time 9    Period Weeks    Status On-going      PT LONG TERM GOAL #5   Title Patient to report 80% improvement in sleeping tolerance.    Time 9    Period Weeks    Status On-going                 Plan - 03/25/20 1417    Clinical Impression Statement Brittany [name] reporting she has been having significant R UE pain which has prevented her from performing supine AAROM flexion, abduction exercises with HEP.  Denies issues with other HEP activities.  Session today started with MT focus on upper shoulder and cervical musculature to improved tissue quality and reduce tension as pt. very ttp throughout.  Cervical  AROM measurements revealing limited B rotation, side bending, and extension ROM.  Progressed cervical stretching and AAAROM shoulder activities without issue today only required occasional guidance for proper angle of motion.  Ended visit with trial of moist heat to cervical spine and upper shoulder to further reduce  tension.  Pt. leaving session noting good pain relief.    Comorbidities stroke 2018, peripheral neuropathy, multiple myeloma, HTN, hiatal hernia, GERD, HLD, anemia    Rehab Potential Good    PT Frequency 2x / week    PT Duration Other (comment)   9 weeks   PT Treatment/Interventions ADLs/Self Care Home Management;Cryotherapy;Electrical Stimulation;Iontophoresis 65m/ml Dexamethasone;Moist Heat;Traction;Balance training;Therapeutic exercise;Therapeutic activities;Functional mobility training;Stair training;Gait training;DME Instruction;Ultrasound;Neuromuscular re-education;Patient/family education;Manual techniques;Vasopneumatic Device;Taping;Energy conservation;Dry needling;Passive range of motion    PT Next Visit Plan B knee/ankle and R hip ROM and palpation, reassess HEP    Consulted and Agree with Plan of Care Patient           Patient will benefit from skilled therapeutic intervention in order to improve the following deficits and impairments:  Decreased activity tolerance,Decreased strength,Increased fascial restricitons,Pain,Impaired UE functional use,Decreased balance,Decreased mobility,Difficulty walking,Increased muscle spasms,Improper body mechanics,Decreased range of motion,Postural dysfunction,Impaired flexibility  Visit Diagnosis: Chronic right shoulder pain  Stiffness of right shoulder, not elsewhere classified  Chronic left shoulder pain  Stiffness of left shoulder, not elsewhere classified  Pain in right arm  Pain in left arm  Other muscle spasm  Difficulty in walking, not elsewhere classified     Problem List Patient Active Problem List   Diagnosis Date Noted  . Anxiety 02/05/2019  . Oral thrush 10/08/2018  . Bilateral leg pain 10/08/2018  . Constipation 10/08/2018  . Shortness of breath 10/08/2018  . 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 B12 deficiency 12/19/2016  . Chest pain 12/18/2016  . Cerebellar infarct ([place]   . Diastolic  dysfunction   . Marijuana abuse   . History of tobacco abuse   . Benign essential HTN   . Tachycardia   . Hypokalemia   . St[place] (cerebrum) ([place] 12/05/2016  . Nonintractable headache 12/05/2016  . [place]ipital stroke ([place] 12/05/2016  . Hypertensive urgency 12/05/2016  . Hypertension    MBess Harvest PDelaware01/06/22 3:12 PM    [facility] [address] [place] [address]Phone: [phone]  Fax:  [phone] Name: Brittany PanikMRN: [id number] of Birth: 813-May-1967

## 2020-03-29 ENCOUNTER — Ambulatory Visit: Payer: Medicaid Other

## 2020-03-29 ENCOUNTER — Other Ambulatory Visit: Payer: Self-pay

## 2020-03-29 VITALS — BP 162/82 | HR 87

## 2020-03-29 DIAGNOSIS — M79601 Pain in right arm: Secondary | ICD-10-CM

## 2020-03-29 DIAGNOSIS — M25611 Stiffness of right shoulder, not elsewhere classified: Secondary | ICD-10-CM

## 2020-03-29 DIAGNOSIS — M25512 Pain in left shoulder: Secondary | ICD-10-CM

## 2020-03-29 DIAGNOSIS — M25511 Pain in right shoulder: Secondary | ICD-10-CM | POA: Diagnosis not present

## 2020-03-29 DIAGNOSIS — M79602 Pain in left arm: Secondary | ICD-10-CM

## 2020-03-29 DIAGNOSIS — M25612 Stiffness of left shoulder, not elsewhere classified: Secondary | ICD-10-CM

## 2020-03-29 DIAGNOSIS — G8929 Other chronic pain: Secondary | ICD-10-CM

## 2020-03-29 DIAGNOSIS — M62838 Other muscle spasm: Secondary | ICD-10-CM

## 2020-03-29 DIAGNOSIS — R262 Difficulty in walking, not elsewhere classified: Secondary | ICD-10-CM

## 2020-03-29 NOTE — Therapy (Signed)
[facility] [address]  [place], [place], [address] Phone: [phone]   Fax:  [phone]  Physical Therapy Treatment  Patient Details  Name: Brittany [name] MRN: [id number] Date of Birth: [birth year] Referring Provider (PT): Kathe Becton, FNP   Encounter Date: 03/29/2020   PT End of Session - 03/29/20 1409    Visit Number 3    Number of Visits 19    Date for PT Re-Evaluation 04/28/20    Authorization Type UHC Medicaid    PT Start Time 1400    PT Stop Time 1445    PT Time Calculation (min) 45 min    Activity Tolerance Patient tolerated treatment well;Patient limited by pain    Behavior During Therapy [facility] for tasks assessed/performed           Past Medical History:  Diagnosis Date  . Anemia   . Arthritis    trigger finger in left hand  . Complication of anesthesia    per pt, hard to wake up!  . Elevated cholesterol   . Female bladder prolapse    per urologist, does not have prolaspe  . GERD (gastroesophageal reflux disease)   . Heart murmur    pt unsure.  . Hiatal hernia   . Hypertension   . IBS (irritable bowel syndrome)   . Multiple myeloma ([place]) 2005   had partial chemo  . Peripheral neuropathy   . SOB (shortness of breath) on exertion    uses an inhaler  . Stroke [facility]) 2017   paralysis left arm/uses a walker    Past Surgical History:  Procedure Laterality Date  . BONE BIOPSY  2005   in her back  . COLONOSCOPY     over 10 years x3  . ESOPHAGOGASTRODUODENOSCOPY     incomplete-over 10 years ago   . TEE WITHOUT CARDIOVERSION N/A 12/08/2016   Procedure: TRANSESOPHAGEAL ECHOCARDIOGRAM (TEE);  Surgeon: Sueanne Margarita, MD;  Location: [facility] ENDOSCOPY;  Service: Cardiovascular;  Laterality: N/A;  . TUBAL LIGATION    . UPPER GASTROINTESTINAL ENDOSCOPY    . WISDOM TOOTH EXTRACTION      Vitals:   03/29/20 1407  BP: (!) 162/82  Pulse: 87  SpO2: 98%     Subjective Assessment - 03/29/20  1407    Subjective Pt. reporting daily performance of HEP.    Pertinent History stroke, peripheral neuropathy, multiple myeloma, HTN, hiatal hernia, GERD, HLD, anemia    Diagnostic tests 08/04/19 L shoulder xray: Mild degenerative osteoarthritic changes about the left glenohumeral and AC articulations, progressed as compared to 2018; 08/04/19 R shoulder xray: Mild degenerative change without acute abnormality.    Patient Stated Goals decrease pain    Currently in Pain? No/denies    Pain Score 0-No pain    Pain Location Shoulder    Pain Orientation Right;Left    Multiple Pain Sites Yes    Pain Score 3    Pain Location --   Thumb   Pain Orientation Left    Pain Descriptors / Indicators Throbbing;Sharp    Pain Type Acute pain    Aggravating Factors  gripping activities                             [facility] Adult PT Treatment/Exercise - 03/29/20 0001      Knee/Hip Exercises: Stretches   Other Knee/Hip Stretches B seated and supine groin stretch x 30 sec  Knee/Hip Exercises: Aerobic   Nustep Lvl 5, 6 min (LE)      Knee/Hip Exercises: Standing   Hip Abduction Right;Left;10 reps;Knee straight    Abduction Limitations red TB at thighs      Knee/Hip Exercises: Seated   Clamshell with TheraBand Red   x 15 reps with red TB   Sit to Sand 5 reps;2 sets   no hand pushoff with red looped TB at knees     Knee/Hip Exercises: Supine   Bridges Both;10 reps;2 sets;Strengthening    Bridges Limitations cues for increased hip ROM      Shoulder Exercises: Seated   Flexion Right;Left;AAROM;10 reps    Flexion Limitations cane                  PT Education - 03/29/20 1803    Education Details HEP update    Person(s) Educated Patient    Methods Explanation;Demonstration;Verbal cues;Handout    Comprehension Returned demonstration;Verbal cues required;Verbalized understanding            PT Short Term Goals - 03/29/20 1442      PT SHORT TERM GOAL #1   Title  Patient to be independent with initial HEP.    Time 5    Period Weeks    Status Achieved    Target Date 03/31/20             PT Long Term Goals - 03/25/20 1416      PT LONG TERM GOAL #1   Title Patient to be independent with advanced HEP.    Time 9    Period Weeks    Status On-going      PT LONG TERM GOAL #2   Title Patient to demonstrate B shoulder, elbow, wrist strength >/=4/5 and grip strength >10 lbs.    Time 9    Period Weeks    Status On-going      PT LONG TERM GOAL #3   Title Patient to demonstrate B shoulder AROM WFL and with mild pain remaining.    Time 9    Period Weeks    Status On-going      PT LONG TERM GOAL #4   Title Patient to report tolerance for 30 minutes on her feet without pain limiting.    Time 9    Period Weeks    Status On-going      PT LONG TERM GOAL #5   Title Patient to report 80% improvement in sleeping tolerance.    Time 9    Period Weeks    Status On-going                 Plan - 03/29/20 1425    Clinical Impression Statement Pt. noting no issues/questions with initial HEP.  STG #1 met.Brittany [name] doing well today.  Feels upper shoulder/cervical STM helped her signficiantly since last session and has noticed reduced shoulder tension and pain since this.  Progress strengtening and stability activities today with Jacoby tolerating well however complaining of thigh pain at times relieved with rest.  HEP updated with LE/UE strengthening activities.    Comorbidities stroke 2018, peripheral neuropathy, multiple myeloma, HTN, hiatal hernia, GERD, HLD, anemia    Rehab Potential Good    PT Frequency 2x / week    PT Duration Other (comment)   9 weeks   PT Treatment/Interventions ADLs/Self Care Home Management;Cryotherapy;Electrical Stimulation;Iontophoresis 47m/ml Dexamethasone;Moist Heat;Traction;Balance training;Therapeutic exercise;Therapeutic activities;Functional mobility training;Stair training;Gait training;DME  Instruction;Ultrasound;Neuromuscular re-education;Patient/family education;Manual techniques;Vasopneumatic Device;Taping;Energy conservation;Dry needling;Passive range of  motion    PT Next Visit Plan B knee/ankle and R hip ROM and palpation    Consulted and Agree with Plan of Care Patient           Patient will benefit from skilled therapeutic intervention in order to improve the following deficits and impairments:  Decreased activity tolerance,Decreased strength,Increased fascial restricitons,Pain,Impaired UE functional use,Decreased balance,Decreased mobility,Difficulty walking,Increased muscle spasms,Improper body mechanics,Decreased range of motion,Postural dysfunction,Impaired flexibility  Visit Diagnosis: Chronic right shoulder pain  Stiffness of right shoulder, not elsewhere classified  Chronic left shoulder pain  Stiffness of left shoulder, not elsewhere classified  Pain in right arm  Pain in left arm  Other muscle spasm  Difficulty in walking, not elsewhere classified     Problem List Patient Active Problem List   Diagnosis Date Noted  . Anxiety 02/05/2019  . Oral thrush 10/08/2018  . Bilateral leg pain 10/08/2018  . Constipation 10/08/2018  . Shortness of breath 10/08/2018  . 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 B12 deficiency 12/19/2016  . Chest pain 12/18/2016  . Cerebellar infarct ([place])   . Diastolic dysfunction   . Marijuana abuse   . History of tobacco abuse   . Benign essential HTN   . Tachycardia   . Hypokalemia   . Stroke (cerebrum) ([place]) 12/05/2016  . Nonintractable headache 12/05/2016  . Occipital stroke ([place]) 12/05/2016  . Hypertensive urgency 12/05/2016  . Hypertension     Bess Harvest, [place] 03/29/20 6:06 PM   [facility] [address]  [place], [place], [address] Phone: [phone]   Fax:  [phone]  Name: Brittany [name] MRN: [id number] Date  of Birth: [birth year]

## 2020-04-01 ENCOUNTER — Other Ambulatory Visit: Payer: Self-pay

## 2020-04-01 ENCOUNTER — Ambulatory Visit: Payer: Medicaid Other

## 2020-04-01 VITALS — BP 170/62 | HR 76

## 2020-04-01 DIAGNOSIS — M25611 Stiffness of right shoulder, not elsewhere classified: Secondary | ICD-10-CM

## 2020-04-01 DIAGNOSIS — M79602 Pain in left arm: Secondary | ICD-10-CM

## 2020-04-01 DIAGNOSIS — M25512 Pain in left shoulder: Secondary | ICD-10-CM

## 2020-04-01 DIAGNOSIS — R262 Difficulty in walking, not elsewhere classified: Secondary | ICD-10-CM

## 2020-04-01 DIAGNOSIS — M25612 Stiffness of left shoulder, not elsewhere classified: Secondary | ICD-10-CM

## 2020-04-01 DIAGNOSIS — M62838 Other muscle spasm: Secondary | ICD-10-CM

## 2020-04-01 DIAGNOSIS — G8929 Other chronic pain: Secondary | ICD-10-CM

## 2020-04-01 DIAGNOSIS — M25511 Pain in right shoulder: Secondary | ICD-10-CM | POA: Diagnosis not present

## 2020-04-01 DIAGNOSIS — M79601 Pain in right arm: Secondary | ICD-10-CM

## 2020-04-01 NOTE — Therapy (Signed)
[facility] [address]  [place], [place], [address] Phone: [phone]   Fax:  [phone]  Physical Therapy Treatment  Patient Details  Name: Brittany [name] MRN: [id number] Date of Birth: April 23, 1965 Referring Provider (PT): Kathe Becton, FNP   Encounter Date: 04/01/2020   PT End of Session - 04/01/20 1421    Visit Number 4    Number of Visits 19    Date for PT Re-Evaluation 04/28/20    Authorization Type UHC Medicaid    PT Start Time 6967    PT Stop Time 1444    PT Time Calculation (min) 40 min    Activity Tolerance Patient tolerated treatment well;Patient limited by pain    Behavior During Therapy [facility] for tasks assessed/performed           Past Medical History:  Diagnosis Date  . Anemia   . Arthritis    trigger finger in left hand  . Complication of anesthesia    per pt, hard to wake up!  . Elevated cholesterol   . Female bladder prolapse    per urologist, does not have prolaspe  . GERD (gastroesophageal reflux disease)   . Heart murmur    pt unsure.  . Hiatal hernia   . Hypertension   . IBS (irritable bowel syndrome)   . Multiple myeloma ([place]) 2005   had partial chemo  . Peripheral neuropathy   . SOB (shortness of breath) on exertion    uses an inhaler  . Stroke [facility]) 2017   paralysis left arm/uses a walker    Past Surgical History:  Procedure Laterality Date  . BONE BIOPSY  2005   in her back  . COLONOSCOPY     over 10 years x3  . ESOPHAGOGASTRODUODENOSCOPY     incomplete-over 10 years ago   . TEE WITHOUT CARDIOVERSION N/A 12/08/2016   Procedure: TRANSESOPHAGEAL ECHOCARDIOGRAM (TEE);  Surgeon: Sueanne Margarita, MD;  Location: [facility] ENDOSCOPY;  Service: Cardiovascular;  Laterality: N/A;  . TUBAL LIGATION    . UPPER GASTROINTESTINAL ENDOSCOPY    . WISDOM TOOTH EXTRACTION      Vitals:   04/01/20 1416  BP: (!) 170/62  Pulse: 76  SpO2: 98%     Subjective Assessment - 04/01/20  1416    Subjective Pt. noting R shouldre and arm pain prevented her from getting a good sleep.  Notes she was up until 4am this morning.    Pertinent History stroke, peripheral neuropathy, multiple myeloma, HTN, hiatal hernia, GERD, HLD, anemia    Diagnostic tests 08/04/19 L shoulder xray: Mild degenerative osteoarthritic changes about the left glenohumeral and AC articulations, progressed as compared to 2018; 08/04/19 R shoulder xray: Mild degenerative change without acute abnormality.    Patient Stated Goals decrease pain    Currently in Pain? Yes    Pain Score 5     Pain Location Shoulder    Pain Orientation Right    Pain Descriptors / Indicators --   "buzzing"   Pain Type Chronic pain    Aggravating Factors  unsure    Pain Score 5    Pain Location --   L thumb   Pain Orientation Left    Pain Descriptors / Indicators Throbbing;Sharp    Pain Type Acute pain                             [facility] Adult PT Treatment/Exercise - 04/01/20  0001      Knee/Hip Exercises: Stretches   Piriformis Stretch Right;Left;1 rep;30 seconds    Piriformis Stretch Limitations KTOS and figure-4 in sitting      Knee/Hip Exercises: Aerobic   Nustep Lvl 3, 7 min (LE only)      Knee/Hip Exercises: Seated   Sit to [employer] 10 reps      Knee/Hip Exercises: Supine   Bridges with Diona Foley Squeeze Both;10 reps;Strengthening    Bridges with Clamshell Both;10 reps;Strengthening   hip abd/ER into red TB at knees     Knee/Hip Exercises: Sidelying   Clams R hip clam shell with red looped TB at knees x 10                    PT Short Term Goals - 03/29/20 1442      PT SHORT TERM GOAL #1   Title Patient to be independent with initial HEP.    Time 5    Period Weeks    Status Achieved    Target Date 03/31/20             PT Long Term Goals - 03/25/20 1416      PT LONG TERM GOAL #1   Title Patient to be independent with advanced HEP.    Time 9    Period Weeks    Status On-going       PT LONG TERM GOAL #2   Title Patient to demonstrate B shoulder, elbow, wrist strength >/=4/5 and grip strength >10 lbs.    Time 9    Period Weeks    Status On-going      PT LONG TERM GOAL #3   Title Patient to demonstrate B shoulder AROM WFL and with mild pain remaining.    Time 9    Period Weeks    Status On-going      PT LONG TERM GOAL #4   Title Patient to report tolerance for 30 minutes on her feet without pain limiting.    Time 9    Period Weeks    Status On-going      PT LONG TERM GOAL #5   Title Patient to report 80% improvement in sleeping tolerance.    Time 9    Period Weeks    Status On-going                 Plan - 04/01/20 1421    Clinical Impression Statement [name] reporting increased R shoulder and UE pain which prevented her from sleeping well last night.  Did have elevated BP initially to start session at 170/48mHg and denies change in medication.  Supervising PT made aware of pt. elevated BP and reached out to MD regarding this as J[place] this elevated BP is a somewhat recent trend.  progressed LE strengthening and stretching today without issue.    Comorbidities stroke 2018, peripheral neuropathy, multiple myeloma, HTN, hiatal hernia, GERD, HLD, anemia    Rehab Potential Good    PT Frequency 2x / week    PT Duration Other (comment)   9 weeks   PT Treatment/Interventions ADLs/Self Care Home Management;Cryotherapy;Electrical Stimulation;Iontophoresis 457mml Dexamethasone;Moist Heat;Traction;Balance training;Therapeutic exercise;Therapeutic activities;Functional mobility training;Stair training;Gait training;DME Instruction;Ultrasound;Neuromuscular re-education;Patient/family education;Manual techniques;Vasopneumatic Device;Taping;Energy conservation;Dry needling;Passive range of motion    Consulted and Agree with Plan of Care Patient           Patient will benefit from skilled therapeutic intervention in order to improve the following deficits  and impairments:  Decreased activity  tolerance,Decreased strength,Increased fascial restricitons,Pain,Impaired UE functional use,Decreased balance,Decreased mobility,Difficulty walking,Increased muscle spasms,Improper body mechanics,Decreased range of motion,Postural dysfunction,Impaired flexibility  Visit Diagnosis: Chronic right shoulder pain  Stiffness of right shoulder, not elsewhere classified  Chronic left shoulder pain  Stiffness of left shoulder, not elsewhere classified  Pain in right arm  Pain in left arm  Other muscle spasm  Difficulty in walking, not elsewhere classified     Problem List Patient Active Problem List   Diagnosis Date Noted  . Anxiety 02/05/2019  . Oral thrush 10/08/2018  . Bilateral leg pain 10/08/2018  . Constipation 10/08/2018  . Shortness of breath 10/08/2018  . 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 B12 deficiency 12/19/2016  . Chest pain 12/18/2016  . Cerebellar infarct ([place])   . Diastolic dysfunction   . Marijuana abuse   . History of tobacco abuse   . Benign essential HTN   . Tachycardia   . Hypokalemia   . Stroke (cerebrum) ([place]) 12/05/2016  . Nonintractable headache 12/05/2016  . Occipital stroke ([place]) 12/05/2016  . Hypertensive urgency 12/05/2016  . Hypertension     Bess Harvest, [place] 04/01/20 5:49 PM   [facility] [address]  [place], [place], [address] Phone: [phone]   Fax:  [phone]  Name: Brittany [name] MRN: [id number] Date of Birth: 01-22-1966

## 2020-04-05 ENCOUNTER — Ambulatory Visit: Payer: Medicaid Other

## 2020-04-08 ENCOUNTER — Encounter: Payer: Medicaid Other | Admitting: Physical Therapy

## 2020-04-12 ENCOUNTER — Ambulatory Visit: Payer: Medicaid Other | Admitting: Physical Therapy

## 2020-04-15 ENCOUNTER — Encounter: Payer: Self-pay | Admitting: Physical Therapy

## 2020-04-15 ENCOUNTER — Ambulatory Visit: Payer: Medicaid Other | Admitting: Physical Therapy

## 2020-04-15 ENCOUNTER — Other Ambulatory Visit: Payer: Self-pay

## 2020-04-15 VITALS — BP 160/75 | HR 82

## 2020-04-15 DIAGNOSIS — M25512 Pain in left shoulder: Secondary | ICD-10-CM

## 2020-04-15 DIAGNOSIS — G8929 Other chronic pain: Secondary | ICD-10-CM

## 2020-04-15 DIAGNOSIS — M79602 Pain in left arm: Secondary | ICD-10-CM

## 2020-04-15 DIAGNOSIS — M25511 Pain in right shoulder: Secondary | ICD-10-CM | POA: Diagnosis not present

## 2020-04-15 DIAGNOSIS — M25611 Stiffness of right shoulder, not elsewhere classified: Secondary | ICD-10-CM

## 2020-04-15 DIAGNOSIS — M79601 Pain in right arm: Secondary | ICD-10-CM

## 2020-04-15 DIAGNOSIS — M62838 Other muscle spasm: Secondary | ICD-10-CM

## 2020-04-15 DIAGNOSIS — R262 Difficulty in walking, not elsewhere classified: Secondary | ICD-10-CM

## 2020-04-15 DIAGNOSIS — M25612 Stiffness of left shoulder, not elsewhere classified: Secondary | ICD-10-CM

## 2020-04-15 NOTE — Therapy (Signed)
[facility] [address]  [place], [place], [address] Phone: [phone]   Fax:  [phone]  Physical Therapy Treatment  Patient Details  Name: Brittany [name] MRN: [id number] Date of Birth: 1965-11-26 Referring Provider (PT): Kathe Becton, FNP   Encounter Date: 04/15/2020   PT End of Session - 04/15/20 1503    Visit Number 5    Number of Visits 19    Date for PT Re-Evaluation 04/28/20    Authorization Type UHC Medicaid    PT Start Time 1401    PT Stop Time 1451    PT Time Calculation (min) 50 min    Activity Tolerance Patient tolerated treatment well    Behavior During Therapy [facility] for tasks assessed/performed           Past Medical History:  Diagnosis Date  . Anemia   . Arthritis    trigger finger in left hand  . Complication of anesthesia    per pt, hard to wake up!  . Elevated cholesterol   . Female bladder prolapse    per urologist, does not have prolaspe  . GERD (gastroesophageal reflux disease)   . Heart murmur    pt unsure.  . Hiatal hernia   . Hypertension   . IBS (irritable bowel syndrome)   . Multiple myeloma ([place]) 2005   had partial chemo  . Peripheral neuropathy   . SOB (shortness of breath) on exertion    uses an inhaler  . Stroke [facility]) 2017   paralysis left arm/uses a walker    Past Surgical History:  Procedure Laterality Date  . BONE BIOPSY  2005   in her back  . COLONOSCOPY     over 10 years x3  . ESOPHAGOGASTRODUODENOSCOPY     incomplete-over 10 years ago   . TEE WITHOUT CARDIOVERSION N/A 12/08/2016   Procedure: TRANSESOPHAGEAL ECHOCARDIOGRAM (TEE);  Surgeon: Sueanne Margarita, MD;  Location: [facility] ENDOSCOPY;  Service: Cardiovascular;  Laterality: N/A;  . TUBAL LIGATION    . UPPER GASTROINTESTINAL ENDOSCOPY    . WISDOM TOOTH EXTRACTION      Vitals:   04/15/20 1404 04/15/20 1407 04/15/20 1419  BP: (!) 190/78 (!) 175/82 (!) 160/75  Pulse: 82    SpO2: 100%        Subjective Assessment - 04/15/20 1404    Subjective Doing okay. L thumb keeps locking up and plans to f/u with her MD about this. Feels that her R groin pain has been improving but now moved to the lateral thigh. Reports compliance with HTN meds but does report that her BP increases when she is in pain. Other than pain, not having HAs or dizziness or feeling unwell.    Pertinent History stroke, peripheral neuropathy, multiple myeloma, HTN, hiatal hernia, GERD, HLD, anemia    Diagnostic tests 08/04/19 L shoulder xray: Mild degenerative osteoarthritic changes about the left glenohumeral and AC articulations, progressed as compared to 2018; 08/04/19 R shoulder xray: Mild degenerative change without acute abnormality.    Patient Stated Goals decrease pain    Currently in Pain? Yes    Pain Score 5    Pain Location --   thigh   Pain Orientation Right    Pain Descriptors / Indicators Aching    Pain Type Acute pain              [facility] PT Assessment - 04/15/20 0001      Strength   Strength Assessment Site Hip;Knee;Ankle  Right/Left Hip Right;Left    Right Hip Flexion 4-/5    Right Hip ABduction 4/5    Right Hip ADduction 4-/5    Left Hip Flexion 4-/5    Left Hip ABduction 4/5    Left Hip ADduction 4-/5    Right/Left Knee Right;Left    Right Knee Flexion 4/5    Right Knee Extension 4/5    Left Knee Flexion 4/5    Left Knee Extension 4-/5    Right/Left Ankle Right;Left    Right Ankle Dorsiflexion 4/5    Right Ankle Plantar Flexion 4/5    Right Ankle Inversion 4+/5    Right Ankle Eversion 4+/5    Left Ankle Dorsiflexion 4/5    Left Ankle Plantar Flexion 4/5    Left Ankle Inversion 4+/5    Left Ankle Eversion 4+/5      Palpation   Palpation comment TTP along R lateral glute and ITB as well as L distal ITB with intermittent trigger pts evident                         [facility] Adult PT Treatment/Exercise - 04/15/20 0001      Exercises   Exercises Knee/Hip       Knee/Hip Exercises: Stretches   ITB Stretch Right;30 seconds;2 reps    ITB Stretch Limitations 1st rep supine with strap, 2nd rep in L sidelying   best stretch in supine   Piriformis Stretch Right;1 rep;30 seconds    Piriformis Stretch Limitations supine KTOS    Other Knee/Hip Stretches R modified ER stretch 2x30"   good tolerance     Knee/Hip Exercises: Standing   Heel Raises Both;1 set;15 reps    Heel Raises Limitations knee instability evident requiring cueing for quad contraction      Knee/Hip Exercises: Seated   Long Arc Quad Strengthening;Right;Left;1 set;10 reps    Long Arc Quad Limitations yellow loop    Hamstring Curl Strengthening;Right;Left;1 set;10 reps    Hamstring Limitations red TB   green TB too difficult                 PT Education - 04/15/20 1502    Education Details update to HEP; advised patient to contact PCP to address elevated BP    Person(s) Educated Patient    Methods Explanation;Demonstration;Tactile cues;Verbal cues;Handout    Comprehension Verbalized understanding;Returned demonstration            PT Short Term Goals - 03/29/20 1442      PT SHORT TERM GOAL #1   Title Patient to be independent with initial HEP.    Time 5    Period Weeks    Status Achieved    Target Date 03/31/20             PT Long Term Goals - 03/25/20 1416      PT LONG TERM GOAL #1   Title Patient to be independent with advanced HEP.    Time 9    Period Weeks    Status On-going      PT LONG TERM GOAL #2   Title Patient to demonstrate B shoulder, elbow, wrist strength >/=4/5 and grip strength >10 lbs.    Time 9    Period Weeks    Status On-going      PT LONG TERM GOAL #3   Title Patient to demonstrate B shoulder AROM WFL and with mild pain remaining.    Time 9  Period Weeks    Status On-going      PT LONG TERM GOAL #4   Title Patient to report tolerance for 30 minutes on her feet without pain limiting.    Time 9    Period Weeks    Status  On-going      PT LONG TERM GOAL #5   Title Patient to report 80% improvement in sleeping tolerance.    Time 9    Period Weeks    Status On-going                 Plan - 04/15/20 1503    Clinical Impression Statement Patient arrived with R lateral hip/thigh pain as main priority. Systolic BP appeared to be elevated at start of session, which leveled out closer to more normal values with rest. Patient asymptomatic, thus proceeded with session while monitoring vitals. PCP has previously been made aware of patient's increasing systolic values, however with pt's hx of stroke, instructed her to contact their office for earlier appointment- patient reported understanding. Patient tolerated R TFL stretching well, with preference for supine and sitting positioning. Assessed LE strength d/t patient's c/o LE weakness. Patient does appear to have lost significant LE strength, thus worked on gentle stretching with cueing to avoid Valsalva. Patient reported understanding of HEP update and without complaints at end of session.    Comorbidities stroke 2018, peripheral neuropathy, multiple myeloma, HTN, hiatal hernia, GERD, HLD, anemia    Rehab Potential Good    PT Frequency 2x / week    PT Duration Other (comment)   9 weeks   PT Treatment/Interventions ADLs/Self Care Home Management;Cryotherapy;Electrical Stimulation;Iontophoresis 44m/ml Dexamethasone;Moist Heat;Traction;Balance training;Therapeutic exercise;Therapeutic activities;Functional mobility training;Stair training;Gait training;DME Instruction;Ultrasound;Neuromuscular re-education;Patient/family education;Manual techniques;Vasopneumatic Device;Taping;Energy conservation;Dry needling;Passive range of motion    PT Next Visit Plan progress UE and LE strength    Consulted and Agree with Plan of Care Patient           Patient will benefit from skilled therapeutic intervention in order to improve the following deficits and impairments:  Decreased  activity tolerance,Decreased strength,Increased fascial restricitons,Pain,Impaired UE functional use,Decreased balance,Decreased mobility,Difficulty walking,Increased muscle spasms,Improper body mechanics,Decreased range of motion,Postural dysfunction,Impaired flexibility  Visit Diagnosis: Chronic right shoulder pain  Stiffness of right shoulder, not elsewhere classified  Chronic left shoulder pain  Stiffness of left shoulder, not elsewhere classified  Pain in right arm  Pain in left arm  Other muscle spasm  Difficulty in walking, not elsewhere classified     Problem List Patient Active Problem List   Diagnosis Date Noted  . Anxiety 02/05/2019  . Oral thrush 10/08/2018  . Bilateral leg pain 10/08/2018  . Constipation 10/08/2018  . Shortness of breath 10/08/2018  . 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 B12 deficiency 12/19/2016  . Chest pain 12/18/2016  . Cerebellar infarct ([place]   . Diastolic dysfunction   . Marijuana abuse   . History of tobacco abuse   . Benign essential HTN   . Tachycardia   . Hypokalemia   . [place]) [place] 12/05/2016  . Nonintractable headache 12/05/2016  . Occipital stroke ([place] 12/05/2016  . Hypertensive urgency 12/05/2016  . Hypertension      YJanene Harvey PT, DPT 04/15/20 3:06 PM   [facility] [address] [place] [address]Phone: [phone]  Fax:  [phone] Name: JSarea FyfeMRN: [id number] of Birth: [birth year]

## 2020-04-27 ENCOUNTER — Other Ambulatory Visit: Payer: Self-pay

## 2020-04-27 ENCOUNTER — Ambulatory Visit: Payer: Medicaid Other | Attending: Family Medicine | Admitting: Physical Therapy

## 2020-04-27 ENCOUNTER — Encounter: Payer: Self-pay | Admitting: Physical Therapy

## 2020-04-27 VITALS — BP 142/77 | HR 76

## 2020-04-27 DIAGNOSIS — M62838 Other muscle spasm: Secondary | ICD-10-CM | POA: Diagnosis present

## 2020-04-27 DIAGNOSIS — M79601 Pain in right arm: Secondary | ICD-10-CM | POA: Insufficient documentation

## 2020-04-27 DIAGNOSIS — M25612 Stiffness of left shoulder, not elsewhere classified: Secondary | ICD-10-CM | POA: Insufficient documentation

## 2020-04-27 DIAGNOSIS — M25512 Pain in left shoulder: Secondary | ICD-10-CM | POA: Insufficient documentation

## 2020-04-27 DIAGNOSIS — G8929 Other chronic pain: Secondary | ICD-10-CM | POA: Diagnosis present

## 2020-04-27 DIAGNOSIS — M79602 Pain in left arm: Secondary | ICD-10-CM | POA: Insufficient documentation

## 2020-04-27 DIAGNOSIS — R262 Difficulty in walking, not elsewhere classified: Secondary | ICD-10-CM | POA: Insufficient documentation

## 2020-04-27 DIAGNOSIS — M25511 Pain in right shoulder: Secondary | ICD-10-CM | POA: Insufficient documentation

## 2020-04-27 DIAGNOSIS — M6281 Muscle weakness (generalized): Secondary | ICD-10-CM | POA: Insufficient documentation

## 2020-04-27 DIAGNOSIS — M25611 Stiffness of right shoulder, not elsewhere classified: Secondary | ICD-10-CM | POA: Diagnosis present

## 2020-04-27 NOTE — Therapy (Signed)
[facility] [address]  [place], [place], [address] Phone: [phone]   Fax:  [phone]  Physical Therapy Treatment  Patient Details  Name: Brittany [name] MRN: [id number] Date of Birth: [birth year] Referring Provider (PT): Kathe Becton, FNP   Encounter Date: 04/27/2020   PT End of Session - 04/27/20 1702    Visit Number 6    Number of Visits 19    Date for PT Re-Evaluation 04/28/20    Authorization Type UHC Medicaid    PT Start Time 1626   pt late   PT Stop Time 1710   ice pack   PT Time Calculation (min) 44 min    Activity Tolerance Patient tolerated treatment well;Patient limited by pain    Behavior During Therapy [facility] for tasks assessed/performed           Past Medical History:  Diagnosis Date  . Anemia   . Arthritis    trigger finger in left hand  . Complication of anesthesia    per pt, hard to wake up!  . Elevated cholesterol   . Female bladder prolapse    per urologist, does not have prolaspe  . GERD (gastroesophageal reflux disease)   . Heart murmur    pt unsure.  . Hiatal hernia   . Hypertension   . IBS (irritable bowel syndrome)   . Multiple myeloma ([place]) 2005   had partial chemo  . Peripheral neuropathy   . SOB (shortness of breath) on exertion    uses an inhaler  . Stroke [facility]) 2017   paralysis left arm/uses a walker    Past Surgical History:  Procedure Laterality Date  . BONE BIOPSY  2005   in her back  . COLONOSCOPY     over 10 years x3  . ESOPHAGOGASTRODUODENOSCOPY     incomplete-over 10 years ago   . TEE WITHOUT CARDIOVERSION N/A 12/08/2016   Procedure: TRANSESOPHAGEAL ECHOCARDIOGRAM (TEE);  Surgeon: Sueanne Margarita, MD;  Location: [facility] ENDOSCOPY;  Service: Cardiovascular;  Laterality: N/A;  . TUBAL LIGATION    . UPPER GASTROINTESTINAL ENDOSCOPY    . WISDOM TOOTH EXTRACTION      Vitals:   04/27/20 1634  BP: (!) 142/77  Pulse: 76  SpO2: 98%     Subjective  Assessment - 04/27/20 1627    Subjective Feels that she may have strained her L wrist yesterday as it is hurting a lot since last night. May have extended it back when holding a pan.    Pertinent History stroke, peripheral neuropathy, multiple myeloma, HTN, hiatal hernia, GERD, HLD, anemia    Diagnostic tests 08/04/19 L shoulder xray: Mild degenerative osteoarthritic changes about the left glenohumeral and AC articulations, progressed as compared to 2018; 08/04/19 R shoulder xray: Mild degenerative change without acute abnormality.    Patient Stated Goals decrease pain    Currently in Pain? Yes    Pain Score 6    Pain Location Wrist    Pain Orientation Left;Posterior;Lateral    Pain Descriptors / Indicators Shooting;Stabbing;Sore    Pain Type Acute pain                             [facility] Adult PT Treatment/Exercise - 04/27/20 0001      Knee/Hip Exercises: Standing   Wall Squat 1 set;10 reps    Wall Squat Limitations depth to tolerance      Knee/Hip Exercises: Seated  [employer] Strengthening;Right;Left;1 set;10 reps    [employer] Limitations red loop   good ability to reach [employer] with [employer]   x15   Hamstring Curl Strengthening;Right;Left;1 set;10 reps    Hamstring Limitations green TB   trouble reaching 90 deg flexion     Knee/Hip Exercises: Supine   Bridges with Ball Squeeze Both;10 reps;Strengthening      Knee/Hip Exercises: Sidelying   Clams R/L with yellow TB 10x each      Modalities   Modalities Cryotherapy      Cryotherapy   Number Minutes Cryotherapy 10 Minutes    Cryotherapy Location Wrist   L   Type of Cryotherapy Ice pack                  PT Education - 04/27/20 1702    Education Details Encouraged patient to ice for 10 minutes as sensation appears to be intact in this area and administered contact info for [employer] [facility] MD if pain does not resolve within a few days    Person(s) Educated Patient    Methods  Explanation    Comprehension Verbalized understanding            PT Short Term Goals - 03/29/20 1442      PT SHORT TERM GOAL #1   Title Patient to be independent with initial HEP.    Time 5    Period Weeks    Status Achieved    Target Date 03/31/20             PT Long Term Goals - 03/25/20 1416      PT LONG TERM GOAL #1   Title Patient to be independent with advanced HEP.    Time 9    Period Weeks    Status On-going      PT LONG TERM GOAL #2   Title Patient to demonstrate B shoulder, elbow, wrist strength >/=4/5 and grip strength >10 lbs.    Time 9    Period Weeks    Status On-going      PT LONG TERM GOAL #3   Title Patient to demonstrate B shoulder AROM WFL and with mild pain remaining.    Time 9    Period Weeks    Status On-going      PT LONG TERM GOAL #4   Title Patient to report tolerance for 30 minutes on her feet without pain limiting.    Time 9    Period Weeks    Status On-going      PT LONG TERM GOAL #5   Title Patient to report 80% improvement in sleeping tolerance.    Time 9    Period Weeks    Status On-going                 Plan - 04/27/20 1703    Clinical Impression Statement Patient arrived to session with report of L wrist pain since yesterday. Suggests that she may have hurt it when holding a pan last night, causing his wrist to bend back too far. Upon observation, dorsal and radial aspect of L wrist appears edematous with painful protuberance over the radial palmar aspect over the carpals. Encouraged patient to ice for 10 minutes as sensation appears to be intact in this area and administered contact info for [facility] MD if pain does not resolve within a few days. Patient performed progressive LE strengthening ther-ex today to avoid exacerbation of  L wrist pain. Patient tolerated increased resistance with LAQ and HS curl today. Able to perform hip strengthening with added resistance with good form and tolerance. Ended session with  ice pack to L wrist for pain relief. Patient without further complaints at end of session.    Comorbidities stroke 2018, peripheral neuropathy, multiple myeloma, HTN, hiatal hernia, GERD, HLD, anemia    Rehab Potential Good    PT Frequency 2x / week    PT Duration Other (comment)   9 weeks   PT Treatment/Interventions ADLs/Self Care Home Management;Cryotherapy;Electrical Stimulation;Iontophoresis 52m/ml Dexamethasone;Moist Heat;Traction;Balance training;Therapeutic exercise;Therapeutic activities;Functional mobility training;Stair training;Gait training;DME Instruction;Ultrasound;Neuromuscular re-education;Patient/family education;Manual techniques;Vasopneumatic Device;Taping;Energy conservation;Dry needling;Passive range of motion    PT Next Visit Plan progress UE and LE strength    Consulted and Agree with Plan of Care Patient           Patient will benefit from skilled therapeutic intervention in order to improve the following deficits and impairments:  Decreased activity tolerance,Decreased strength,Increased fascial restricitons,Pain,Impaired UE functional use,Decreased balance,Decreased mobility,Difficulty walking,Increased muscle spasms,Improper body mechanics,Decreased range of motion,Postural dysfunction,Impaired flexibility  Visit Diagnosis: Chronic right shoulder pain  Stiffness of right shoulder, not elsewhere classified  Chronic left shoulder pain  Stiffness of left shoulder, not elsewhere classified  Pain in right arm  Pain in left arm  Other muscle spasm  Difficulty in walking, not elsewhere classified     Problem List Patient Active Problem List   Diagnosis Date Noted  . Anxiety 02/05/2019  . Oral thrush 10/08/2018  . Bilateral leg pain 10/08/2018  . Constipation 10/08/2018  . Shortness of breath 10/08/2018  . 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 B12 deficiency 12/19/2016  . Chest pain 12/18/2016  . [place]   .  Diastolic dysfunction   . Marijuana abuse   . History of tobacco abuse   . Benign essential HTN   . Tachycardia   . Hypokalemia   . Stroke ([place]rebrum) [place] 12/05/2016  . Nonintractable headache 12/05/2016  . [place]ipital stroke [place] 12/05/2016  . Hypertensive urgency 12/05/2016  . Hypertension     YJanene Harvey PT, DPT 04/27/20 5:52 PM   [facility] [place] [address]Phone: [phone]  Fax:  [phone] Name: Brittany TheallMRN: [id number] of Birth: 808-23-1967

## 2020-05-04 ENCOUNTER — Other Ambulatory Visit: Payer: Self-pay

## 2020-05-04 ENCOUNTER — Ambulatory Visit: Payer: Medicaid Other | Admitting: Physical Therapy

## 2020-05-04 ENCOUNTER — Encounter: Payer: Self-pay | Admitting: Physical Therapy

## 2020-05-04 DIAGNOSIS — M6281 Muscle weakness (generalized): Secondary | ICD-10-CM

## 2020-05-04 DIAGNOSIS — G8929 Other chronic pain: Secondary | ICD-10-CM

## 2020-05-04 DIAGNOSIS — M79602 Pain in left arm: Secondary | ICD-10-CM

## 2020-05-04 DIAGNOSIS — M25612 Stiffness of left shoulder, not elsewhere classified: Secondary | ICD-10-CM

## 2020-05-04 DIAGNOSIS — M62838 Other muscle spasm: Secondary | ICD-10-CM

## 2020-05-04 DIAGNOSIS — M25512 Pain in left shoulder: Secondary | ICD-10-CM

## 2020-05-04 DIAGNOSIS — M25511 Pain in right shoulder: Secondary | ICD-10-CM | POA: Diagnosis not present

## 2020-05-04 DIAGNOSIS — R262 Difficulty in walking, not elsewhere classified: Secondary | ICD-10-CM

## 2020-05-04 DIAGNOSIS — M79601 Pain in right arm: Secondary | ICD-10-CM

## 2020-05-04 DIAGNOSIS — M25611 Stiffness of right shoulder, not elsewhere classified: Secondary | ICD-10-CM

## 2020-05-04 NOTE — Therapy (Signed)
[facility] [address]  [place], [place], [address] Phone: [phone]   Fax:  [phone]  Physical Therapy Progress Note  Patient Details  Name: Novia Lansberry MRN: [id number] Date of Birth: 1965-07-15 Referring Provider (PT): Kathe Becton, FNP   Encounter Date: 05/04/2020   PT End of Session - 05/04/20 1445    Visit Number 7    Number of Visits 23    Date for PT Re-Evaluation 06/29/20    Authorization Type UHC Medicaid    PT Start Time 1403    PT Stop Time 1444    PT Time Calculation (min) 41 min    Activity Tolerance Patient tolerated treatment well;Patient limited by pain    Behavior During Therapy [facility] for tasks assessed/performed           Past Medical History:  Diagnosis Date  . Anemia   . Arthritis    trigger finger in left hand  . Complication of anesthesia    per pt, hard to wake up!  . Elevated cholesterol   . Female bladder prolapse    per urologist, does not have prolaspe  . GERD (gastroesophageal reflux disease)   . Heart murmur    pt unsure.  . Hiatal hernia   . Hypertension   . IBS (irritable bowel syndrome)   . Multiple myeloma ([place]) 2005   had partial chemo  . Peripheral neuropathy   . SOB (shortness of breath) on exertion    uses an inhaler  . Stroke [facility]) 2017   paralysis left arm/uses a walker    Past Surgical History:  Procedure Laterality Date  . BONE BIOPSY  2005   in her back  . COLONOSCOPY     over 10 years x3  . ESOPHAGOGASTRODUODENOSCOPY     incomplete-over 10 years ago   . TEE WITHOUT CARDIOVERSION N/A 12/08/2016   Procedure: TRANSESOPHAGEAL ECHOCARDIOGRAM (TEE);  Surgeon: Sueanne Margarita, MD;  Location: [facility] ENDOSCOPY;  Service: Cardiovascular;  Laterality: N/A;  . TUBAL LIGATION    . UPPER GASTROINTESTINAL ENDOSCOPY    . WISDOM TOOTH EXTRACTION      There were no vitals filed for this visit.   Subjective Assessment - 05/04/20 1403    Subjective  Reports good improvement in L wrist pain since last session- ice helped a lot. Reports little improvement since initial evaluation- still struggles with standing, lifting and pulling with the L thumb, LE weakness, and R arm pain. Notes that since her niece is now in school, she does not have much help at home. Reports compliance with HEP and denies questions.    Pertinent History stroke, peripheral neuropathy, multiple myeloma, HTN, hiatal hernia, GERD, HLD, anemia    Diagnostic tests 08/04/19 L shoulder xray: Mild degenerative osteoarthritic changes about the left glenohumeral and AC articulations, progressed as compared to 2018; 08/04/19 R shoulder xray: Mild degenerative change without acute abnormality.    Patient Stated Goals decrease pain    Currently in Pain? No/denies    Pain Score 5    Pain Location Buttocks    Pain Orientation Right    Pain Descriptors / Indicators Sore;Sharp    Pain Type Acute pain              [facility] PT Assessment - 05/04/20 1410      Assessment   Medical Diagnosis Generalized weakness, occipital stroke, chronic pain of B shoulders    Referring Provider (PT) Kathe Becton, FNP  Onset Date/Surgical Date --   chronic- several years     AROM   Right Shoulder Flexion 86 Degrees    Right Shoulder ABduction 80 Degrees    Right Shoulder Internal Rotation --   FIR to L1   Right Shoulder External Rotation --   FER to forehead   Left Shoulder Flexion 91 Degrees    Left Shoulder ABduction 65 Degrees    Left Shoulder Internal Rotation --   FIR T12   Left Shoulder External Rotation --   FER to top of head     Strength   Right Shoulder Flexion 2+/5    Right Shoulder ABduction 2+/5    Right Shoulder Internal Rotation 3+/5    Right Shoulder External Rotation 3+/5    Left Shoulder Flexion 2+/5    Left Shoulder ABduction 2/5    Left Shoulder Internal Rotation 3+/5    Left Shoulder External Rotation 3+/5    Right Elbow Flexion 4/5    Right Elbow Extension 4/5     Left Elbow Flexion 4/5    Left Elbow Extension 4/5    Right Wrist Flexion 4/5    Right Wrist Extension 4/5                         [facility] Adult PT Treatment/Exercise - 05/04/20 0001      Exercises   Exercises Shoulder      Shoulder Exercises: Supine   Flexion Right;AAROM;5 reps    Flexion Limitations with wand to tolerance    ABduction AAROM;Right;Left;5 reps    Shoulder ABduction Weight (lbs) holding onto handle of SPC for improved hand comfort      Shoulder Exercises: Sidelying   External Rotation Right;Left;10 reps    External Rotation Limitations shoulder in neutral    ABduction AROM;Right;Left;5 reps    ABduction Limitations thumb up; very limited ROM d/t weakness                  PT Education - 05/04/20 1444    Education Details update to HEP; discussion on objective progress and remaining impairments    Person(s) Educated Patient    Methods Explanation;Demonstration;Tactile cues;Verbal cues;Handout    Comprehension Returned demonstration;Verbalized understanding            PT Short Term Goals - 05/04/20 1408      PT SHORT TERM GOAL #1   Title Patient to be independent with initial HEP.    Time 5    Period Weeks    Status Achieved    Target Date 03/31/20             PT Long Term Goals - 05/04/20 1409      PT LONG TERM GOAL #1   Title Patient to be independent with advanced HEP.    Time 8    Period Weeks    Status On-going   met for current   Target Date 06/29/20      PT LONG TERM GOAL #2   Title Patient to demonstrate B shoulder, elbow, wrist strength >/=4/5 and grip strength >10 lbs.    Time 8    Period Weeks    Status On-going   revealed improvement in B elbow extension and L elbow flexion and R wrist flexion/extension; shoulder strength limited   Target Date 06/29/20      PT LONG TERM GOAL #3   Title Patient to demonstrate B shoulder AROM WFL and with mild pain remaining.  Time 8    Period Weeks    Status  On-going   improved in B shoulder flexion,   Target Date 06/29/20      PT LONG TERM GOAL #4   Title Patient to report tolerance for 30 minutes on her feet without pain limiting.    Time 8    Period Weeks    Status Partially Met   reports tolerance for 15-20 minutes of standing and walking; unable to stand still for that long   Target Date 06/29/20      PT LONG TERM GOAL #5   Title Patient to report 80% improvement in sleeping tolerance.    Time 8    Period Weeks    Status Partially Met   reporting 50% improvement in sleeping tolerance with help of supportive pillow   Target Date 06/29/20      Additional Long Term Goals   Additional Long Term Goals Yes      PT LONG TERM GOAL #6   Title Patient to demonstrate B LE strength >/=4/5.    Time 8    Period Weeks    Status New    Target Date 06/29/20                 Plan - 05/04/20 1446    Clinical Impression Statement Patient arrived to session with initial report of little progress with therapy. However, after discussion, patient notes improved tolerance to 15-20 minutes on her feet before onset of pain and 50% improvement in sleeping tolerance with use of modifications. Patient does however note continued difficulty lifting and pulling with the L thumb, LE weakness, and R arm pain. Notes decreased support at home d/t her niece going to school. Strength testing revealed improvement in B elbow extension and L elbow flexion and R wrist flexion/extension. B shoulder AROM has improved in flexion, however ROM still considerably limited and demonstrated slight regression in certain movements. Shoulder ROM and strengthening was performed with adjustments made to grip for improved L hand comfort.  HEP was updated with exercises that were well-tolerated today. Patient is demonstrating progress towards goals, however considering the chronicity of her condition, will benefit from continuity therapy services 2x/week for 8 weeks.    Comorbidities  stroke 2018, peripheral neuropathy, multiple myeloma, HTN, hiatal hernia, GERD, HLD, anemia    Rehab Potential Good    PT Frequency 2x / week    PT Duration 8 weeks    PT Treatment/Interventions ADLs/Self Care Home Management;Cryotherapy;Electrical Stimulation;Iontophoresis 87m/ml Dexamethasone;Moist Heat;Traction;Balance training;Therapeutic exercise;Therapeutic activities;Functional mobility training;Stair training;Gait training;DME Instruction;Ultrasound;Neuromuscular re-education;Patient/family education;Manual techniques;Vasopneumatic Device;Taping;Energy conservation;Dry needling;Passive range of motion    PT Next Visit Plan progress UE and LE strength to tolerance    Consulted and Agree with Plan of Care Patient           Patient will benefit from skilled therapeutic intervention in order to improve the following deficits and impairments:  Decreased activity tolerance,Decreased strength,Increased fascial restricitons,Pain,Impaired UE functional use,Decreased balance,Decreased mobility,Difficulty walking,Increased muscle spasms,Improper body mechanics,Decreased range of motion,Postural dysfunction,Impaired flexibility,Abnormal gait  Visit Diagnosis: Chronic right shoulder pain  Stiffness of right shoulder, not elsewhere classified  Chronic left shoulder pain  Stiffness of left shoulder, not elsewhere classified  Pain in right arm  Pain in left arm  Other muscle spasm  Difficulty in walking, not elsewhere classified  Muscle weakness (generalized)     Problem List Patient Active Problem List   Diagnosis Date Noted  . Anxiety 02/05/2019  . Oral thrush 10/08/2018  .  Bilateral leg pain 10/08/2018  . Constipation 10/08/2018  . Shortness of breath 10/08/2018  . 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 B12 deficiency 12/19/2016  . Chest pain 12/18/2016  . Cerebellar infarct ([place])   . Diastolic dysfunction   . Marijuana abuse   . History of tobacco  abuse   . Benign essential HTN   . Tachycardia   . Hypokalemia   . Stroke (cerebrum) ([place]) 12/05/2016  . Nonintractable headache 12/05/2016  . Occipital stroke ([place]) 12/05/2016  . Hypertensive urgency 12/05/2016  . Hypertension      Janene Harvey, PT, DPT 05/04/20 6:17 PM   [facility] [address]  [place], [place], [address] Phone: [phone]   Fax:  [phone]  Name: Latonyia Lopata MRN: [id number] Date of Birth: [birth year]

## 2020-05-06 ENCOUNTER — Ambulatory Visit: Payer: Medicaid Other

## 2020-05-12 ENCOUNTER — Ambulatory Visit: Payer: Medicaid Other | Admitting: Physical Therapy

## 2020-05-12 ENCOUNTER — Other Ambulatory Visit: Payer: Self-pay | Admitting: Family Medicine

## 2020-05-12 ENCOUNTER — Telehealth: Payer: Self-pay | Admitting: Family Medicine

## 2020-05-12 DIAGNOSIS — G8929 Other chronic pain: Secondary | ICD-10-CM

## 2020-05-12 DIAGNOSIS — M25512 Pain in left shoulder: Secondary | ICD-10-CM

## 2020-05-12 DIAGNOSIS — I639 Cerebral infarction, unspecified: Secondary | ICD-10-CM

## 2020-05-12 NOTE — Telephone Encounter (Signed)
Pt called states today physical therapist suggested pt go to ortho. Please refer to Clearance Coots MD,[facility], on [employer]

## 2020-05-14 ENCOUNTER — Other Ambulatory Visit: Payer: Self-pay

## 2020-05-14 ENCOUNTER — Ambulatory Visit: Payer: Medicaid Other

## 2020-05-14 DIAGNOSIS — M25511 Pain in right shoulder: Secondary | ICD-10-CM

## 2020-05-14 DIAGNOSIS — M25611 Stiffness of right shoulder, not elsewhere classified: Secondary | ICD-10-CM

## 2020-05-14 DIAGNOSIS — M79602 Pain in left arm: Secondary | ICD-10-CM

## 2020-05-14 DIAGNOSIS — G8929 Other chronic pain: Secondary | ICD-10-CM

## 2020-05-14 DIAGNOSIS — M25512 Pain in left shoulder: Secondary | ICD-10-CM

## 2020-05-14 DIAGNOSIS — M25612 Stiffness of left shoulder, not elsewhere classified: Secondary | ICD-10-CM

## 2020-05-14 NOTE — Therapy (Signed)
[facility] [address]  [place], [place], [address] Phone: [phone]   Fax:  [phone]  Physical Therapy Treatment  Patient Details  Name: Taliah Porche MRN: [id number] Date of Birth: [birth year] Referring Provider (PT): Kathe Becton, FNP   Encounter Date: 05/14/2020   PT End of Session - 05/14/20 1149    Visit Number 8    Number of Visits 23    Date for PT Re-Evaluation 06/29/20    Authorization Type UHC Medicaid    PT Start Time 1106    PT Stop Time 1146    PT Time Calculation (min) 40 min    Activity Tolerance Patient tolerated treatment well;Patient limited by pain    Behavior During Therapy [facility] for tasks assessed/performed           Past Medical History:  Diagnosis Date  . Anemia   . Arthritis    trigger finger in left hand  . Complication of anesthesia    per pt, hard to wake up!  . Elevated cholesterol   . Female bladder prolapse    per urologist, does not have prolaspe  . GERD (gastroesophageal reflux disease)   . Heart murmur    pt unsure.  . Hiatal hernia   . Hypertension   . IBS (irritable bowel syndrome)   . Multiple myeloma ([place]) 2005   had partial chemo  . Peripheral neuropathy   . SOB (shortness of breath) on exertion    uses an inhaler  . Stroke [facility]) 2017   paralysis left arm/uses a walker    Past Surgical History:  Procedure Laterality Date  . BONE BIOPSY  2005   in her back  . COLONOSCOPY     over 10 years x3  . ESOPHAGOGASTRODUODENOSCOPY     incomplete-over 10 years ago   . TEE WITHOUT CARDIOVERSION N/A 12/08/2016   Procedure: TRANSESOPHAGEAL ECHOCARDIOGRAM (TEE);  Surgeon: Sueanne Margarita, MD;  Location: [facility] ENDOSCOPY;  Service: Cardiovascular;  Laterality: N/A;  . TUBAL LIGATION    . UPPER GASTROINTESTINAL ENDOSCOPY    . WISDOM TOOTH EXTRACTION      There were no vitals filed for this visit.   Subjective Assessment - 05/14/20 1110    Subjective Pt  reports she has had constant pain in her L wrist and R shoulder.    Pertinent History stroke, peripheral neuropathy, multiple myeloma, HTN, hiatal hernia, GERD, HLD, anemia    Diagnostic tests 08/04/19 L shoulder xray: Mild degenerative osteoarthritic changes about the left glenohumeral and AC articulations, progressed as compared to 2018; 08/04/19 R shoulder xray: Mild degenerative change without acute abnormality.    Currently in Pain? Yes    Pain Score 5     Pain Location Shoulder   L arm and hand   Pain Orientation Right;Left    Pain Descriptors / Indicators Constant;Stabbing;Sore    Pain Type Chronic pain                             [facility] Adult PT Treatment/Exercise - 05/14/20 0001      Exercises   Exercises Shoulder      Shoulder Exercises: Supine   Flexion AAROM;Both;10 reps    Flexion Limitations wand    ABduction AAROM;Both;10 reps    Shoulder ABduction Weight (lbs) cane    Other Supine Exercises rythmic stabilization all directions 1 min each UE    Other Supine Exercises B D1  flexion 10 reps      Shoulder Exercises: Standing   Other Standing Exercises press up on elevated mat table; 10 reps on elbows    Other Standing Exercises beach ball rollouts on elbows 10 reps      Manual Therapy   Manual Therapy Soft tissue mobilization    Soft tissue mobilization STM to biceps, anteromedial deltoid                    PT Short Term Goals - 05/04/20 1408      PT SHORT TERM GOAL #1   Title Patient to be independent with initial HEP.    Time 5    Period Weeks    Status Achieved    Target Date 03/31/20             PT Long Term Goals - 05/04/20 1409      PT LONG TERM GOAL #1   Title Patient to be independent with advanced HEP.    Time 8    Period Weeks    Status On-going   met for current   Target Date 06/29/20      PT LONG TERM GOAL #2   Title Patient to demonstrate B shoulder, elbow, wrist strength >/=4/5 and grip strength >10 lbs.     Time 8    Period Weeks    Status On-going   revealed improvement in B elbow extension and L elbow flexion and R wrist flexion/extension; shoulder strength limited   Target Date 06/29/20      PT LONG TERM GOAL #3   Title Patient to demonstrate B shoulder AROM WFL and with mild pain remaining.    Time 8    Period Weeks    Status On-going   improved in B shoulder flexion,   Target Date 06/29/20      PT LONG TERM GOAL #4   Title Patient to report tolerance for 30 minutes on her feet without pain limiting.    Time 8    Period Weeks    Status Partially Met   reports tolerance for 15-20 minutes of standing and walking; unable to stand still for that long   Target Date 06/29/20      PT LONG TERM GOAL #5   Title Patient to report 80% improvement in sleeping tolerance.    Time 8    Period Weeks    Status Partially Met   reporting 50% improvement in sleeping tolerance with help of supportive pillow   Target Date 06/29/20      Additional Long Term Goals   Additional Long Term Goals Yes      PT LONG TERM GOAL #6   Title Patient to demonstrate B LE strength >/=4/5.    Time 8    Period Weeks    Status New    Target Date 06/29/20                 Plan - 05/14/20 1150    Clinical Impression Statement Pt responded well to treatment, she did have shoulder pain and weakness with OH movements. Cont'd with shoulder ROM exercises along with WBing exercises for shoulder stability. Pt needed cueing to correctly perform the exercises and to target the correct shoulder muscles. Pt had tightness found along anteromedial shoulder and going into the biceps on the L. STM given to help with relaxation of the musculature. Pt responded well stating that she needed the massage to help decrease her pain and help with  moving her am. Pt stated that she was going to ice at home after therapy session.    Personal Factors and Comorbidities Age;Comorbidity 3+;Fitness;Past/Current Experience;Time since onset  of injury/illness/exacerbation    Comorbidities stroke 2018, peripheral neuropathy, multiple myeloma, HTN, hiatal hernia, GERD, HLD, anemia    PT Frequency 2x / week    PT Duration 8 weeks    PT Treatment/Interventions ADLs/Self Care Home Management;Cryotherapy;Electrical Stimulation;Iontophoresis 19m/ml Dexamethasone;Moist Heat;Traction;Balance training;Therapeutic exercise;Therapeutic activities;Functional mobility training;Stair training;Gait training;DME Instruction;Ultrasound;Neuromuscular re-education;Patient/family education;Manual techniques;Vasopneumatic Device;Taping;Energy conservation;Dry needling;Passive range of motion    PT Next Visit Plan progress UE and LE strength to tolerance    Consulted and Agree with Plan of Care Patient           Patient will benefit from skilled therapeutic intervention in order to improve the following deficits and impairments:  Decreased activity tolerance,Decreased strength,Increased fascial restricitons,Pain,Impaired UE functional use,Decreased balance,Decreased mobility,Difficulty walking,Increased muscle spasms,Improper body mechanics,Decreased range of motion,Postural dysfunction,Impaired flexibility,Abnormal gait  Visit Diagnosis: Chronic right shoulder pain  Stiffness of right shoulder, not elsewhere classified  Chronic left shoulder pain  Stiffness of left shoulder, not elsewhere classified  Pain in left arm     Problem List Patient Active Problem List   Diagnosis Date Noted  . Anxiety 02/05/2019  . Oral thrush 10/08/2018  . Bilateral leg pain 10/08/2018  . Constipation 10/08/2018  . Shortness of breath 10/08/2018  . 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 B12 deficiency 12/19/2016  . Chest pain 12/18/2016  . Cerebellar infarct ([place]   . Diastolic dysfunction   . Marijuana abuse   . History of tobacco abuse   . Benign essential HTN   . Tachycardia   . Hypokalemia   . Stroke (cerebrum) ([place] 12/05/2016   . Nonintractable headache 12/05/2016  . Occipital stroke ([place] 12/05/2016  . Hypertensive urgency 12/05/2016  . Hypertension     BArtist Pais PTA 05/14/2020, 11:59 AM  [facility] [place] [address]Phone: [phone]  Fax:  [phone] Name: JMalayla GranberryMRN: [id number] of Birth: 8Apr 29, 1967

## 2020-05-20 ENCOUNTER — Ambulatory Visit (INDEPENDENT_AMBULATORY_CARE_PROVIDER_SITE_OTHER): Payer: Medicaid Other | Admitting: Family Medicine

## 2020-05-20 ENCOUNTER — Ambulatory Visit: Payer: Self-pay

## 2020-05-20 ENCOUNTER — Other Ambulatory Visit: Payer: Self-pay

## 2020-05-20 ENCOUNTER — Ambulatory Visit: Payer: Medicaid Other | Attending: Family Medicine

## 2020-05-20 ENCOUNTER — Encounter: Payer: Self-pay | Admitting: Family Medicine

## 2020-05-20 VITALS — BP 140/72 | Ht 61.0 in | Wt 140.0 lb

## 2020-05-20 DIAGNOSIS — M79602 Pain in left arm: Secondary | ICD-10-CM

## 2020-05-20 DIAGNOSIS — M25512 Pain in left shoulder: Secondary | ICD-10-CM | POA: Insufficient documentation

## 2020-05-20 DIAGNOSIS — M25511 Pain in right shoulder: Secondary | ICD-10-CM | POA: Insufficient documentation

## 2020-05-20 DIAGNOSIS — M24111 Other articular cartilage disorders, right shoulder: Secondary | ICD-10-CM

## 2020-05-20 DIAGNOSIS — M62838 Other muscle spasm: Secondary | ICD-10-CM

## 2020-05-20 DIAGNOSIS — G8929 Other chronic pain: Secondary | ICD-10-CM

## 2020-05-20 DIAGNOSIS — M79601 Pain in right arm: Secondary | ICD-10-CM | POA: Diagnosis present

## 2020-05-20 DIAGNOSIS — M67432 Ganglion, left wrist: Secondary | ICD-10-CM

## 2020-05-20 DIAGNOSIS — M6281 Muscle weakness (generalized): Secondary | ICD-10-CM

## 2020-05-20 DIAGNOSIS — M25611 Stiffness of right shoulder, not elsewhere classified: Secondary | ICD-10-CM

## 2020-05-20 DIAGNOSIS — R262 Difficulty in walking, not elsewhere classified: Secondary | ICD-10-CM

## 2020-05-20 DIAGNOSIS — M65312 Trigger thumb, left thumb: Secondary | ICD-10-CM | POA: Diagnosis not present

## 2020-05-20 DIAGNOSIS — M25612 Stiffness of left shoulder, not elsewhere classified: Secondary | ICD-10-CM

## 2020-05-20 DIAGNOSIS — M79645 Pain in left finger(s): Secondary | ICD-10-CM | POA: Diagnosis not present

## 2020-05-20 MED ORDER — TRIAMCINOLONE ACETONIDE 40 MG/ML IJ SUSP
40.0000 mg | Freq: Once | INTRAMUSCULAR | Status: AC
  Administered 2020-05-20: 40 mg via INTRA_ARTICULAR

## 2020-05-20 NOTE — Progress Notes (Signed)
Medication Samples have been provided to the patient.  Drug name: Pennsaid     Strength: 2%      Qty: 2 boxes LOT: [id number]  Exp.Date: 07/2020  Dosing instructions: Use a pea sized amount on affected area twice daily  The patient has been instructed regarding the correct time, dose, and frequency of taking this medication, including desired effects and most common side effects.   Jonathon Resides 4:36 PM 05/20/2020

## 2020-05-20 NOTE — Patient Instructions (Signed)
Nice to meet you Please try the rub on medicine  Please try the exercises  Please try ice on the shoulder   Please send me a message in [place] with any questions or updates.  Please see me back in 4 weeks .   --Dr. Raeford Razor

## 2020-05-20 NOTE — Assessment & Plan Note (Signed)
Has a large encircling effusion extending down the biceps tendon.  Likely emanating from a degenerative change of the labrum. -Counseled on home exercise therapy and supportive care. -Could consider injection or physical therapy -Could consider MRI

## 2020-05-20 NOTE — Assessment & Plan Note (Signed)
Triggering evident on exam. -Counseled on home exercise therapy and supportive care. -Injection. -Counseled on splint. -Could consider reinjection.

## 2020-05-20 NOTE — Progress Notes (Signed)
Brittany [name] - 55 y.o. female MRN [id number]  Date of birth: January 08, 1966  SUBJECTIVE:  Including CC & ROS.  No chief complaint on file.   Brittany [name] is a 55 y.o. female that is presenting with left thumb and wrist pain.  Also presenting with right shoulder pain.  Her left wrist has been a problem for years.  Seems like the symptoms got worse.  The right shoulder is bothering her in the anterior aspect.  No specific injury or inciting event.  Has been going to physical therapy and has tried medications,.  Independent review of the right shoulder x-ray from 2021 shows no significant changes.  Independent review of the left shoulder x-ray from 2021 shows mild degenerative change at [facility] joint.   Review of Systems See HPI   HISTORY: Past Medical, Surgical, Social, and Family History Reviewed & Updated per EMR.   Pertinent Historical Findings include:  Past Medical History:  Diagnosis Date  . Anemia   . Arthritis    trigger finger in left hand  . Complication of anesthesia    per pt, hard to wake up!  . Elevated cholesterol   . Female bladder prolapse    per urologist, does not have prolaspe  . GERD (gastroesophageal reflux disease)   . Heart murmur    pt unsure.  . Hiatal hernia   . Hypertension   . IBS (irritable bowel syndrome)   . Multiple myeloma ([place]) 2005   had partial chemo  . Peripheral neuropathy   . SOB (shortness of breath) on exertion    uses an inhaler  . Stroke [facility]) 2017   paralysis left arm/uses a walker    Past Surgical History:  Procedure Laterality Date  . BONE BIOPSY  2005   in her back  . COLONOSCOPY     over 10 years x3  . ESOPHAGOGASTRODUODENOSCOPY     incomplete-over 10 years ago   . TEE WITHOUT CARDIOVERSION N/A 12/08/2016   Procedure: TRANSESOPHAGEAL ECHOCARDIOGRAM (TEE);  Surgeon: Sueanne Margarita, MD;  Location: [facility] ENDOSCOPY;  Service: Cardiovascular;  Laterality: N/A;  . TUBAL LIGATION    . UPPER GASTROINTESTINAL ENDOSCOPY    . WISDOM  TOOTH EXTRACTION      Family History  Problem Relation Age of Onset  . Hypertension Mother   . Sarcoidosis Mother        currently in remission   . Diverticulitis Mother   . Irritable bowel syndrome Mother   . Liver cancer Mother   . Hypertension Father   . Stomach cancer Father   . Congestive Heart Failure Father   . Stroke Maternal Uncle   . Scoliosis Brother   . Colon cancer Neg Hx   . Esophageal cancer Neg Hx   . Colon polyps Neg Hx   . Rectal cancer Neg Hx     Social History   Socioeconomic History  . Marital status: Legally Separated    Spouse name: Not on file  . Number of children: Not on file  . Years of education: Not on file  . Highest education level: Not on file  Occupational History  . Occupation: Disabled  Tobacco Use  . Smoking status: Former Smoker    Quit date: 12/06/2015    Years since quitting: 4.4  . Smokeless tobacco: Never Used  Vaping Use  . Vaping Use: Never used  Substance and Sexual Activity  . Alcohol use: Not Currently  . Drug use: Not Currently    Types: Marijuana  Comment: in the past   . Sexual activity: Not Currently  Other Topics Concern  . Not on file  Social History Narrative  . Not on file   Social Determinants of Health   Financial Resource Strain: Not on file  Food Insecurity: Not on file  Transportation Needs: Not on file  Physical Activity: Not on file  Stress: Not on file  Social Connections: Not on file  Intimate Partner Violence: Not on file     PHYSICAL EXAM:  VS: BP 140/72 (BP Location: Left Arm, Patient Position: Sitting, Cuff Size: Normal)   Ht 5' 1"  (1.549 m)   Wt 140 lb (63.5 kg)   BMI 26.45 kg/m  Physical Exam Gen: NAD, alert, cooperative with exam, well-appearing MSK:  Left thumb and wrist: Normal range of motion. Triggering of the thumb. Right shoulder: Normal range of motion. Normal strength resistance. Neurovascular intact  Limited ultrasound: Left wrist and thumb, right  shoulder:  Left thumb and wrist: Degenerative change of the CMC joint. Cystic change in the volar aspect axillobrachial nerve. No effusion noticed of the wrist.  Right shoulder: Effusion encircling the biceps tendon. Normal-appearing subscapularis. Supraspinatus with mild degenerative changes at the footprint. No significant effusion appreciated posterior glenohumeral joint.  Summary: Ganglion cyst in the wrist.  Encircling effusion of the biceps tendon.  Ultrasound and interpretation by Clearance Coots, MD   Aspiration/Injection Procedure Note Brittany [name] 09-Jun-1965  Procedure: Injection Indications: left thumb trigger finger   Procedure Details Consent: Risks of procedure as well as the alternatives and risks of each were explained to the (patient/caregiver).  Consent for procedure obtained. Time Out: Verified patient identification, verified procedure, site/side was marked, verified correct patient position, special equipment/implants available, medications/allergies/relevent history reviewed, required imaging and test results available.  Performed.  The area was cleaned with iodine and alcohol swabs.    The left trigger thumb was injected using 1 cc's of 40 mg Kenalog and 1 cc's of 0.25% bupivacaine with a 25 1 1/2" needle.  Ultrasound was used. Images were obtained in long views showing the injection.     A sterile dressing was applied.  Patient did tolerate procedure well.     ASSESSMENT & PLAN:   Trigger thumb of left hand Triggering evident on exam. -Counseled on home exercise therapy and supportive care. -Injection. -Counseled on splint. -Could consider reinjection.  Ganglion cyst of volar aspect of left wrist Cyst appreciated on the the volar aspect.  Has close proximity to the radial artery. -Counseled supportive care. -Provided Pennsaid samples. -Could consider aspiration or surgery.  Labral tear of shoulder, degenerative, right Has a large  encircling effusion extending down the biceps tendon.  Likely emanating from a degenerative change of the labrum. -Counseled on home exercise therapy and supportive care. -Could consider injection or physical therapy -Could consider MRI

## 2020-05-20 NOTE — Therapy (Signed)
[facility] [address]  [place], [place], [address] Phone: [phone]   Fax:  [phone]  Physical Therapy Treatment  Patient Details  Name: Brittany [name] MRN: [id number] Date of Birth: 05/12/1965 Referring Provider (PT): Kathe Becton, FNP   Encounter Date: 05/20/2020   PT End of Session - 05/20/20 1439    Visit Number 9    Number of Visits 23    Date for PT Re-Evaluation 06/29/20    Authorization Type UHC Medicaid    PT Start Time 1400    PT Stop Time 1442    PT Time Calculation (min) 42 min    Activity Tolerance Patient tolerated treatment well    Behavior During Therapy [facility] for tasks assessed/performed           Past Medical History:  Diagnosis Date  . Anemia   . Arthritis    trigger finger in left hand  . Complication of anesthesia    per pt, hard to wake up!  . Elevated cholesterol   . Female bladder prolapse    per urologist, does not have prolaspe  . GERD (gastroesophageal reflux disease)   . Heart murmur    pt unsure.  . Hiatal hernia   . Hypertension   . IBS (irritable bowel syndrome)   . Multiple myeloma ([place]) 2005   had partial chemo  . Peripheral neuropathy   . SOB (shortness of breath) on exertion    uses an inhaler  . Stroke [facility]) 2017   paralysis left arm/uses a walker    Past Surgical History:  Procedure Laterality Date  . BONE BIOPSY  2005   in her back  . COLONOSCOPY     over 10 years x3  . ESOPHAGOGASTRODUODENOSCOPY     incomplete-over 10 years ago   . TEE WITHOUT CARDIOVERSION N/A 12/08/2016   Procedure: TRANSESOPHAGEAL ECHOCARDIOGRAM (TEE);  Surgeon: Sueanne Margarita, MD;  Location: [facility] ENDOSCOPY;  Service: Cardiovascular;  Laterality: N/A;  . TUBAL LIGATION    . UPPER GASTROINTESTINAL ENDOSCOPY    . WISDOM TOOTH EXTRACTION      There were no vitals filed for this visit.   Subjective Assessment - 05/20/20 1405    Subjective Pt reported that her L wrist has  really been bothering her she is planning on seeing MD today after treatment.    Pertinent History stroke, peripheral neuropathy, multiple myeloma, HTN, hiatal hernia, GERD, HLD, anemia    Diagnostic tests 08/04/19 L shoulder xray: Mild degenerative osteoarthritic changes about the left glenohumeral and AC articulations, progressed as compared to 2018; 08/04/19 R shoulder xray: Mild degenerative change without acute abnormality.    Patient Stated Goals decrease pain    Currently in Pain? Yes    Pain Score 6     Pain Location Wrist    Pain Orientation Right;Left    Pain Descriptors / Indicators Constant    Pain Type Chronic pain                             [facility] Adult PT Treatment/Exercise - 05/20/20 0001      Exercises   Exercises Shoulder      Knee/Hip Exercises: Aerobic   Other Aerobic UBE L1x 5 min      Knee/Hip Exercises: Seated   Sit to Sand 2 sets;10 reps;without UE support   cues for eccentric lowering     Knee/Hip Exercises: Supine  Bridges Strengthening;Both;2 sets;10 reps    Straight Leg Raises Strengthening;Both;1 set;10 reps      Shoulder Exercises: Seated   Extension Strengthening;Both;10 reps    Theraband Level (Shoulder Extension) Level 2 (Red)    Row Strengthening;Both;10 reps;Theraband    Theraband Level (Shoulder Row) Level 2 (Red)    Other Seated Exercises B D2 flexion 10x    Other Seated Exercises prayer roll on physioball 10x 3 way      Shoulder Exercises: Standing   Other Standing Exercises physioball rollout 15x                    PT Short Term Goals - 05/04/20 1408      PT SHORT TERM GOAL #1   Title Patient to be independent with initial HEP.    Time 5    Period Weeks    Status Achieved    Target Date 03/31/20             PT Long Term Goals - 05/04/20 1409      PT LONG TERM GOAL #1   Title Patient to be independent with advanced HEP.    Time 8    Period Weeks    Status On-going   met for current    Target Date 06/29/20      PT LONG TERM GOAL #2   Title Patient to demonstrate B shoulder, elbow, wrist strength >/=4/5 and grip strength >10 lbs.    Time 8    Period Weeks    Status On-going   revealed improvement in B elbow extension and L elbow flexion and R wrist flexion/extension; shoulder strength limited   Target Date 06/29/20      PT LONG TERM GOAL #3   Title Patient to demonstrate B shoulder AROM WFL and with mild pain remaining.    Time 8    Period Weeks    Status On-going   improved in B shoulder flexion,   Target Date 06/29/20      PT LONG TERM GOAL #4   Title Patient to report tolerance for 30 minutes on her feet without pain limiting.    Time 8    Period Weeks    Status Partially Met   reports tolerance for 15-20 minutes of standing and walking; unable to stand still for that long   Target Date 06/29/20      PT LONG TERM GOAL #5   Title Patient to report 80% improvement in sleeping tolerance.    Time 8    Period Weeks    Status Partially Met   reporting 50% improvement in sleeping tolerance with help of supportive pillow   Target Date 06/29/20      Additional Long Term Goals   Additional Long Term Goals Yes      PT LONG TERM GOAL #6   Title Patient to demonstrate B LE strength >/=4/5.    Time 8    Period Weeks    Status New    Target Date 06/29/20                 Plan - 05/20/20 1440    Clinical Impression Statement Pt reported that she was going to MD after treatment about her L wrist and arm. Cueing for eccentric lowering during STS and to isolate shoulder muscles during exercises. Pt shows broken movements in WB positions and when going OH with her shld. Pt reported her shoulder wasn't giving her much trouble today but her L  wrist has been bothering her. Worked on some physioball rollouts for WB through the UE and ROM with the shoulders. Patient responded well to treatment.    Personal Factors and Comorbidities Age;Comorbidity 3+;Fitness;Past/Current  Experience;Time since onset of injury/illness/exacerbation    Comorbidities stroke 2018, peripheral neuropathy, multiple myeloma, HTN, hiatal hernia, GERD, HLD, anemia    PT Frequency 2x / week    PT Duration 8 weeks    PT Treatment/Interventions ADLs/Self Care Home Management;Cryotherapy;Electrical Stimulation;Iontophoresis 59m/ml Dexamethasone;Moist Heat;Traction;Balance training;Therapeutic exercise;Therapeutic activities;Functional mobility training;Stair training;Gait training;DME Instruction;Ultrasound;Neuromuscular re-education;Patient/family education;Manual techniques;Vasopneumatic Device;Taping;Energy conservation;Dry needling;Passive range of motion    PT Next Visit Plan progress UE and LE strength to tolerance    Consulted and Agree with Plan of Care Patient           Patient will benefit from skilled therapeutic intervention in order to improve the following deficits and impairments:     Visit Diagnosis: Chronic right shoulder pain  Stiffness of right shoulder, not elsewhere classified  Chronic left shoulder pain  Stiffness of left shoulder, not elsewhere classified  Pain in left arm  Pain in right arm  Other muscle spasm  Difficulty in walking, not elsewhere classified  Muscle weakness (generalized)     Problem List Patient Active Problem List   Diagnosis Date Noted  . Anxiety 02/05/2019  . Oral thrush 10/08/2018  . Bilateral leg pain 10/08/2018  . Constipation 10/08/2018  . Shortness of breath 10/08/2018  . 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 B12 deficiency 12/19/2016  . Chest pain 12/18/2016  . Cerebellar infarct ([place]   . Diastolic dysfunction   . Marijuana abuse   . History of tobacco abuse   . Benign essential HTN   . Tachycardia   . Hypokalemia   . Stroke (cerebrum) ([place] 12/05/2016  . Nonintractable headache 12/05/2016  . Occipital stroke ([place] 12/05/2016  . Hypertensive urgency 12/05/2016  . Hypertension      BArtist Pais PTA 05/20/2020, 3:39 PM  [facility] [place] [address]Phone: [phone]  Fax:  [phone] Name: JFlorabelle CardinMRN: [id number] of Birth: 810-08-1965

## 2020-05-20 NOTE — Assessment & Plan Note (Signed)
Cyst appreciated on the the volar aspect.  Has close proximity to the radial artery. -Counseled supportive care. -Provided Pennsaid samples. -Could consider aspiration or surgery.

## 2020-05-25 ENCOUNTER — Other Ambulatory Visit: Payer: Self-pay

## 2020-05-25 ENCOUNTER — Ambulatory Visit: Payer: Medicaid Other

## 2020-05-25 DIAGNOSIS — M25511 Pain in right shoulder: Secondary | ICD-10-CM

## 2020-05-25 DIAGNOSIS — M25611 Stiffness of right shoulder, not elsewhere classified: Secondary | ICD-10-CM

## 2020-05-25 DIAGNOSIS — M25612 Stiffness of left shoulder, not elsewhere classified: Secondary | ICD-10-CM

## 2020-05-25 DIAGNOSIS — G8929 Other chronic pain: Secondary | ICD-10-CM

## 2020-05-25 DIAGNOSIS — M79602 Pain in left arm: Secondary | ICD-10-CM

## 2020-05-25 NOTE — Therapy (Signed)
[facility] [address]  [place], [place], [address] Phone: [phone]   Fax:  [phone]  Physical Therapy Treatment  Patient Details  Name: Brittany [name] MRN: [id number] Date of Birth: 1965-05-21 Referring Provider (PT): Kathe Becton, FNP   Encounter Date: 05/25/2020   PT End of Session - 05/25/20 1447    Visit Number 10    Number of Visits 23    Date for PT Re-Evaluation 06/29/20    Authorization Type UHC Medicaid    PT Start Time 7416    PT Stop Time 1443    PT Time Calculation (min) 41 min    Activity Tolerance Patient tolerated treatment well    Behavior During Therapy [facility] for tasks assessed/performed           Past Medical History:  Diagnosis Date  . Anemia   . Arthritis    trigger finger in left hand  . Complication of anesthesia    per pt, hard to wake up!  . Elevated cholesterol   . Female bladder prolapse    per urologist, does not have prolaspe  . GERD (gastroesophageal reflux disease)   . Heart murmur    pt unsure.  . Hiatal hernia   . Hypertension   . IBS (irritable bowel syndrome)   . Multiple myeloma ([place]) 2005   had partial chemo  . Peripheral neuropathy   . SOB (shortness of breath) on exertion    uses an inhaler  . Stroke [facility]) 2017   paralysis left arm/uses a walker    Past Surgical History:  Procedure Laterality Date  . BONE BIOPSY  2005   in her back  . COLONOSCOPY     over 10 years x3  . ESOPHAGOGASTRODUODENOSCOPY     incomplete-over 10 years ago   . TEE WITHOUT CARDIOVERSION N/A 12/08/2016   Procedure: TRANSESOPHAGEAL ECHOCARDIOGRAM (TEE);  Surgeon: Sueanne Margarita, MD;  Location: [facility] ENDOSCOPY;  Service: Cardiovascular;  Laterality: N/A;  . TUBAL LIGATION    . UPPER GASTROINTESTINAL ENDOSCOPY    . WISDOM TOOTH EXTRACTION      There were no vitals filed for this visit.   Subjective Assessment - 05/25/20 1404    Subjective Pt reports she is going to have  an MRI, also received muscle rub from MD which works good.    Pertinent History stroke, peripheral neuropathy, multiple myeloma, HTN, hiatal hernia, GERD, HLD, anemia    Diagnostic tests 08/04/19 L shoulder xray: Mild degenerative osteoarthritic changes about the left glenohumeral and AC articulations, progressed as compared to 2018; 08/04/19 R shoulder xray: Mild degenerative change without acute abnormality.    Patient Stated Goals decrease pain    Currently in Pain? Yes    Pain Score 6    if extended                            [facility]Exercise - 05/25/20 0001      Exercises   Exercises Shoulder      Knee/Hip Exercises: Aerobic   Other Aerobic UBE L1 x 6 min      Knee/Hip Exercises: Seated   Sit to Sand 2 sets;10 reps;without UE support      Shoulder Exercises: Seated   Other Seated Exercises reaches with 1# 2x10      Shoulder Exercises: Prone   Other Prone Exercises pelvic tilt in quadriped 10 reps  Manual Therapy   Manual Therapy Soft tissue mobilization    Soft tissue mobilization STM to lateral and posterior shoulder                    PT Short Term Goals - 05/04/20 1408      PT SHORT TERM GOAL #1   Title Patient to be independent with initial HEP.    Time 5    Period Weeks    Status Achieved    Target Date 03/31/20             PT Long Term Goals - 05/04/20 1409      PT LONG TERM GOAL #1   Title Patient to be independent with advanced HEP.    Time 8    Period Weeks    Status On-going   met for current   Target Date 06/29/20      PT LONG TERM GOAL #2   Title Patient to demonstrate B shoulder, elbow, wrist strength >/=4/5 and grip strength >10 lbs.    Time 8    Period Weeks    Status On-going   revealed improvement in B elbow extension and L elbow flexion and R wrist flexion/extension; shoulder strength limited   Target Date 06/29/20      PT LONG TERM GOAL #3   Title Patient to demonstrate B shoulder  AROM WFL and with mild pain remaining.    Time 8    Period Weeks    Status On-going   improved in B shoulder flexion,   Target Date 06/29/20      PT LONG TERM GOAL #4   Title Patient to report tolerance for 30 minutes on her feet without pain limiting.    Time 8    Period Weeks    Status Partially Met   reports tolerance for 15-20 minutes of standing and walking; unable to stand still for that long   Target Date 06/29/20      PT LONG TERM GOAL #5   Title Patient to report 80% improvement in sleeping tolerance.    Time 8    Period Weeks    Status Partially Met   reporting 50% improvement in sleeping tolerance with help of supportive pillow   Target Date 06/29/20      Additional Long Term Goals   Additional Long Term Goals Yes      PT LONG TERM GOAL #6   Title Patient to demonstrate B LE strength >/=4/5.    Time 8    Period Weeks    Status New    Target Date 06/29/20                 Plan - 05/25/20 1448    Clinical Impression Statement Pt began session with pain in lateral shoulder moslty when reaching out in front of her. Did some STM to lateral shoulder pre treatment and pt reported it helped a lot with the exercises. She is mostly limited by tightness and pain in her lateral shoulder. Pt still is stiff and guarded with shoulder movements, manual cues to help pt relax and allow her shoulder to move. Pt was unable to tolerate quadriped position for too long d/t pain in her wrist. She had fatigue with STS and needed rest in between sets for recovery. Pt responded well to treatment.    Personal Factors and Comorbidities Age;Comorbidity 3+;Fitness;Past/Current Experience;Time since onset of injury/illness/exacerbation    Comorbidities stroke 2018, peripheral neuropathy, multiple myeloma, HTN, hiatal  hernia, GERD, HLD, anemia    PT Frequency 2x / week    PT Duration 8 weeks    PT Treatment/Interventions ADLs/Self Care Home Management;Cryotherapy;Electrical  Stimulation;Iontophoresis 74m/ml Dexamethasone;Moist Heat;Traction;Balance training;Therapeutic exercise;Therapeutic activities;Functional mobility training;Stair training;Gait training;DME Instruction;Ultrasound;Neuromuscular re-education;Patient/family education;Manual techniques;Vasopneumatic Device;Taping;Energy conservation;Dry needling;Passive range of motion    PT Next Visit Plan progress UE and LE strength to tolerance    Consulted and Agree with Plan of Care Patient           Patient will benefit from skilled therapeutic intervention in order to improve the following deficits and impairments:  Decreased activity tolerance,Decreased strength,Increased fascial restricitons,Pain,Impaired UE functional use,Decreased balance,Decreased mobility,Difficulty walking,Increased muscle spasms,Improper body mechanics,Decreased range of motion,Postural dysfunction,Impaired flexibility,Abnormal gait  Visit Diagnosis: Chronic right shoulder pain  Stiffness of right shoulder, not elsewhere classified  Chronic left shoulder pain  Stiffness of left shoulder, not elsewhere classified  Pain in left arm     Problem List Patient Active Problem List   Diagnosis Date Noted  . Ganglion cyst of volar aspect of left wrist 05/20/2020  . Trigger thumb of left hand 05/20/2020  . Labral tear of shoulder, degenerative, right 05/20/2020  . Anxiety 02/05/2019  . Oral thrush 10/08/2018  . Bilateral leg pain 10/08/2018  . Constipation 10/08/2018  . Shortness of breath 10/08/2018  . 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 B12 deficiency 12/19/2016  . Chest pain 12/18/2016  . Cerebellar infarct ([place]   . Diastolic dysfunction   . Marijuana abuse   . History of tobacco abuse   . Benign essential HTN   . Tachycardia   . Hypokalemia   . Stroke (cerebrum) ([place] 12/05/2016  . Nonintractable headache 12/05/2016  . Occipital stroke ([place] 12/05/2016  . Hypertensive urgency 12/05/2016   . Hypertension     BArtist Pais PTA 05/25/2020, 6:05 PM  [facility] [place] [address]Phone: [phone]  Fax:  [phone] Name: JSameeha RockefellerMRN: [id number] of Birth: [birth year]

## 2020-05-27 ENCOUNTER — Encounter: Payer: Self-pay | Admitting: Physical Therapy

## 2020-05-27 ENCOUNTER — Other Ambulatory Visit: Payer: Self-pay

## 2020-05-27 ENCOUNTER — Ambulatory Visit: Payer: Medicaid Other | Admitting: Physical Therapy

## 2020-05-27 DIAGNOSIS — M25612 Stiffness of left shoulder, not elsewhere classified: Secondary | ICD-10-CM

## 2020-05-27 DIAGNOSIS — M25511 Pain in right shoulder: Secondary | ICD-10-CM | POA: Diagnosis not present

## 2020-05-27 DIAGNOSIS — M6281 Muscle weakness (generalized): Secondary | ICD-10-CM

## 2020-05-27 DIAGNOSIS — R262 Difficulty in walking, not elsewhere classified: Secondary | ICD-10-CM

## 2020-05-27 DIAGNOSIS — M25611 Stiffness of right shoulder, not elsewhere classified: Secondary | ICD-10-CM

## 2020-05-27 DIAGNOSIS — M79601 Pain in right arm: Secondary | ICD-10-CM

## 2020-05-27 DIAGNOSIS — M62838 Other muscle spasm: Secondary | ICD-10-CM

## 2020-05-27 DIAGNOSIS — G8929 Other chronic pain: Secondary | ICD-10-CM

## 2020-05-27 DIAGNOSIS — M79602 Pain in left arm: Secondary | ICD-10-CM

## 2020-05-27 NOTE — Therapy (Signed)
[facility] [address]  [place], [place], [address] Phone: [phone]   Fax:  [phone]  Physical Therapy Treatment  Patient Details  Name: Brittany [name] MRN: [id number] Date of Birth: Jan 16, 1966 Referring Provider (PT): Kathe Becton, FNP   Encounter Date: 05/27/2020   PT End of Session - 05/27/20 1449    Visit Number 11    Number of Visits 23    Date for PT Re-Evaluation 06/29/20    Authorization Type UHC Medicaid    Progress Note Due on Visit 17    PT Start Time 1402    PT Stop Time 1456    PT Time Calculation (min) 54 min    Activity Tolerance Patient limited by pain    Behavior During Therapy [facility] for tasks assessed/performed           Past Medical History:  Diagnosis Date  . Anemia   . Arthritis    trigger finger in left hand  . Complication of anesthesia    per pt, hard to wake up!  . Elevated cholesterol   . Female bladder prolapse    per urologist, does not have prolaspe  . GERD (gastroesophageal reflux disease)   . Heart murmur    pt unsure.  . Hiatal hernia   . Hypertension   . IBS (irritable bowel syndrome)   . Multiple myeloma ([place]) 2005   had partial chemo  . Peripheral neuropathy   . SOB (shortness of breath) on exertion    uses an inhaler  . Stroke [facility]) 2017   paralysis left arm/uses a walker    Past Surgical History:  Procedure Laterality Date  . BONE BIOPSY  2005   in her back  . COLONOSCOPY     over 10 years x3  . ESOPHAGOGASTRODUODENOSCOPY     incomplete-over 10 years ago   . TEE WITHOUT CARDIOVERSION N/A 12/08/2016   Procedure: TRANSESOPHAGEAL ECHOCARDIOGRAM (TEE);  Surgeon: Sueanne Margarita, MD;  Location: [facility] ENDOSCOPY;  Service: Cardiovascular;  Laterality: N/A;  . TUBAL LIGATION    . UPPER GASTROINTESTINAL ENDOSCOPY    . WISDOM TOOTH EXTRACTION      There were no vitals filed for this visit.   Subjective Assessment - 05/27/20 1404    Subjective Reports  that she woke up with L sided neck pain and swelling today. Worse with pressure and turning her head. Everything else is feeling okay today.    Pertinent History stroke, peripheral neuropathy, multiple myeloma, HTN, hiatal hernia, GERD, HLD, anemia    Diagnostic tests 08/04/19 L shoulder xray: Mild degenerative osteoarthritic changes about the left glenohumeral and AC articulations, progressed as compared to 2018; 08/04/19 R shoulder xray: Mild degenerative change without acute abnormality.    Patient Stated Goals decrease pain    Currently in Pain? Yes    Pain Score 6    Pain Location Neck    Pain Orientation Left    Pain Descriptors / Indicators Aching;Stabbing    Pain Type Acute pain                             [facility] Adult PT Treatment/Exercise - 05/27/20 0001      Knee/Hip Exercises: Aerobic   Nustep L5 x 6 min (LEs only)      Shoulder Exercises: Seated   Other Seated Exercises B forward/back shoulder circles 5x each      Modalities   Modalities  Electrical Stimulation      Moist Heat Therapy   Number Minutes Moist Heat 15 Minutes    Moist Heat Location Cervical      Electrical Stimulation   Electrical Stimulation Location B UT    Electrical Stimulation Action IFC    Electrical Stimulation Parameters output to tolerance; 15 min    Electrical Stimulation Goals Tone;Pain      Manual Therapy   Manual Therapy Soft tissue mobilization;Myofascial release    Manual therapy comments sitting    Soft tissue mobilization STM and IASTM to L UT, LS, scalenes, cervical paraspinals    Myofascial Release manual TPR to L UT, LS, scalenes, cervical paraspinals   good twitch response; significant trigger points throughout     Neck Exercises: Stretches   Upper Trapezius Stretch Left;1 rep;30 seconds    Upper Trapezius Stretch Limitations to tolerance    Levator Stretch Left;1 rep;30 seconds    Levator Stretch Limitations to tolerance    Other Neck Stretches L scalene  stretch 2x30" to tolerance                  PT Education - 05/27/20 1449    Education Details Educated patient on use of ball on wall self-STM and use of theracane to manage muscular pain at home    Person(s) Educated Patient    Methods Explanation;Demonstration;Tactile cues;Verbal cues    Comprehension Verbalized understanding            PT Short Term Goals - 05/04/20 1408      PT SHORT TERM GOAL #1   Title Patient to be independent with initial HEP.    Time 5    Period Weeks    Status Achieved    Target Date 03/31/20             PT Long Term Goals - 05/27/20 1450      PT LONG TERM GOAL #1   Title Patient to be independent with advanced HEP.    Time 8    Period Weeks    Status On-going   met for current     PT LONG TERM GOAL #2   Title Patient to demonstrate B shoulder, elbow, wrist strength >/=4/5 and grip strength >10 lbs.    Time 8    Period Weeks    Status On-going   revealed improvement in B elbow extension and L elbow flexion and R wrist flexion/extension; shoulder strength limited     PT LONG TERM GOAL #3   Title Patient to demonstrate B shoulder AROM WFL and with mild pain remaining.    Time 8    Period Weeks    Status On-going   improved in B shoulder flexion,     PT LONG TERM GOAL #4   Title Patient to report tolerance for 30 minutes on her feet without pain limiting.    Time 8    Period Weeks    Status Partially Met   reports tolerance for 15-20 minutes of standing and walking; unable to stand still for that long     PT LONG TERM GOAL #5   Title Patient to report 80% improvement in sleeping tolerance.    Time 8    Period Weeks    Status Partially Met   reporting 50% improvement in sleeping tolerance with help of supportive pillow     PT LONG TERM GOAL #6   Title Patient to demonstrate B LE strength >/=4/5.    Time 8  Period Weeks    Status On-going                 Plan - 05/27/20 1450    Clinical Impression Statement  Patient arrived to session with report that she woke up with L sided neck pain and swelling today without known cause. Began session with gentle cervical stretching to address patient's pain. Patient with very limited ROM and poor tolerance of stretching, thus proceeded with MT. Patient demonstrated significant soft tissue restriction and multiple trigger points in the L UT, LS, scalenes, cervical paraspinals. Notes good benefit and reduction of pain to 2/10 after MT. Educated patient on use of ball on wall self-STM and use of theracane to manage this pain at home. Ended session with moist heat and e-stim to cervical musculature. Patient reported good improvement in pain at end of session.    Personal Factors and Comorbidities Age;Comorbidity 3+;Fitness;Past/Current Experience;Time since onset of injury/illness/exacerbation    Comorbidities stroke 2018, peripheral neuropathy, multiple myeloma, HTN, hiatal hernia, GERD, HLD, anemia    PT Frequency 2x / week    PT Duration 8 weeks    PT Treatment/Interventions ADLs/Self Care Home Management;Cryotherapy;Electrical Stimulation;Iontophoresis 65m/ml Dexamethasone;Moist Heat;Traction;Balance training;Therapeutic exercise;Therapeutic activities;Functional mobility training;Stair training;Gait training;DME Instruction;Ultrasound;Neuromuscular re-education;Patient/family education;Manual techniques;Vasopneumatic Device;Taping;Energy conservation;Dry needling;Passive range of motion    PT Next Visit Plan progress UE and LE strength to tolerance    Consulted and Agree with Plan of Care Patient           Patient will benefit from skilled therapeutic intervention in order to improve the following deficits and impairments:  Decreased activity tolerance,Decreased strength,Increased fascial restricitons,Pain,Impaired UE functional use,Decreased balance,Decreased mobility,Difficulty walking,Increased muscle spasms,Improper body mechanics,Decreased range of  motion,Postural dysfunction,Impaired flexibility,Abnormal gait  Visit Diagnosis: Chronic right shoulder pain  Stiffness of right shoulder, not elsewhere classified  Chronic left shoulder pain  Stiffness of left shoulder, not elsewhere classified  Pain in left arm  Pain in right arm  Other muscle spasm  Difficulty in walking, not elsewhere classified  Muscle weakness (generalized)     Problem List Patient Active Problem List   Diagnosis Date Noted  . Ganglion cyst of volar aspect of left wrist 05/20/2020  . Trigger thumb of left hand 05/20/2020  . Labral tear of shoulder, degenerative, right 05/20/2020  . Anxiety 02/05/2019  . Oral thrush 10/08/2018  . Bilateral leg pain 10/08/2018  . Constipation 10/08/2018  . Shortness of breath 10/08/2018  . 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 B12 deficiency 12/19/2016  . Chest pain 12/18/2016  . Cerebellar infarct ([place]   . Diastolic dysfunction   . Marijuana abuse   . History of tobacco abuse   . Benign essential HTN   . Tachycardia   . Hypokalemia   . Stroke (cerebrum) ([place] 12/05/[place]  . Nonintractable headache 12/05/2016  . [place]ipital stroke ([place] 12/05/2016  . Hypertensive urgency 12/05/2016  . Hypertension      YJanene Harvey PT, DPT 05/27/20 3:26 PM   [facility] [place] [address]Phone: [phone]  Fax:  [phone] Name: JVeena SturgessMRN: [id number] of Birth: [birth year]

## 2020-06-01 ENCOUNTER — Ambulatory Visit: Payer: Medicaid Other

## 2020-06-01 ENCOUNTER — Other Ambulatory Visit: Payer: Self-pay

## 2020-06-01 DIAGNOSIS — M25511 Pain in right shoulder: Secondary | ICD-10-CM | POA: Diagnosis not present

## 2020-06-01 DIAGNOSIS — M79602 Pain in left arm: Secondary | ICD-10-CM

## 2020-06-01 DIAGNOSIS — M25611 Stiffness of right shoulder, not elsewhere classified: Secondary | ICD-10-CM

## 2020-06-01 DIAGNOSIS — G8929 Other chronic pain: Secondary | ICD-10-CM

## 2020-06-01 DIAGNOSIS — M25512 Pain in left shoulder: Secondary | ICD-10-CM

## 2020-06-01 DIAGNOSIS — M25612 Stiffness of left shoulder, not elsewhere classified: Secondary | ICD-10-CM

## 2020-06-01 DIAGNOSIS — M79601 Pain in right arm: Secondary | ICD-10-CM

## 2020-06-01 NOTE — Therapy (Addendum)
[facility] [address]  [place], [place], [address] Phone: [phone]   Fax:  [phone]  Physical Therapy Treatment  Patient Details  Name: Brittany [name] MRN: [id number] Date of Birth: [birth year] Referring Provider (PT): Kathe Becton, FNP   Encounter Date: 06/01/2020   PT End of Session - 06/01/20 1818    Visit Number 12    Number of Visits 23    Date for PT Re-Evaluation 06/29/20    Authorization Type [employer] Medicaid    Progress Note Due on Visit 17    PT Start Time 1401    PT Stop Time 1454    PT Time Calculation (min) 53 min    Activity Tolerance Patient tolerated treatment well;Patient limited by pain    Behavior During Therapy [facility] for tasks assessed/performed           Past Medical History:  Diagnosis Date  . Anemia   . Arthritis    trigger finger in left hand  . Complication of anesthesia    per pt, hard to wake up!  . Elevated cholesterol   . Female bladder prolapse    per urologist, does not have prolaspe  . GERD (gastroesophageal reflux disease)   . Heart murmur    pt unsure.  . Hiatal hernia   . Hypertension   . IBS (irritable bowel syndrome)   . Multiple myeloma ([place]) 2005   had partial chemo  . Peripheral neuropathy   . SOB (shortness of breath) on exertion    uses an inhaler  . Stroke [facility]) 2017   paralysis left arm/uses a walker    Past Surgical History:  Procedure Laterality Date  . BONE BIOPSY  2005   in her back  . COLONOSCOPY     over 10 years x3  . ESOPHAGOGASTRODUODENOSCOPY     incomplete-over 10 years ago   . TEE WITHOUT CARDIOVERSION N/A 12/08/2016   Procedure: TRANSESOPHAGEAL ECHOCARDIOGRAM (TEE);  Surgeon: Sueanne Margarita, MD;  Location: [facility] ENDOSCOPY;  Service: Cardiovascular;  Laterality: N/A;  . TUBAL LIGATION    . UPPER GASTROINTESTINAL ENDOSCOPY    . WISDOM TOOTH EXTRACTION      There were no vitals filed for this visit.   Subjective Assessment -  06/01/20 1408    Subjective Pt reports that her R elbow has been bothering her today. The massage from last session helped and she is not as tight as she was.    Pertinent History stroke, peripheral neuropathy, multiple myeloma, HTN, hiatal hernia, GERD, HLD, anemia    Diagnostic tests 08/04/19 L shoulder xray: Mild degenerative osteoarthritic changes about the left glenohumeral and AC articulations, progressed as compared to 2018; 08/04/19 R shoulder xray: Mild degenerative change without acute abnormality.    Patient Stated Goals decrease pain    Currently in Pain? Yes    Pain Score 5     Pain Location Elbow    Pain Orientation Right    Pain Descriptors / Indicators Aching;Sore    Pain Type Chronic pain                             [facility] Adult PT Treatment/Exercise - 06/01/20 0001      Exercises   Exercises Shoulder      Knee/Hip Exercises: Aerobic   Nustep L4 x 6 min      Shoulder Exercises: Seated   Extension Strengthening;Both;20 reps;Theraband  Theraband Level (Shoulder Extension) Level 2 (Red)    Row Strengthening;Both;20 reps;Theraband    Theraband Level (Shoulder Row) Level 2 (Red)    Flexion Strengthening;Both;Weights;15 reps    Flexion Weight (lbs) 1    Abduction Strengthening;Both;15 reps;Weights    ABduction Weight (lbs) 1    Other Seated Exercises 5x15", orange pball rollouts flexion    Other Seated Exercises reaches with 1# 2x10   focused on up and across movements     Modalities   Modalities Electrical Stimulation      Moist Heat Therapy   Number Minutes Moist Heat 12 Minutes    Moist Heat Location Cervical      Electrical Stimulation   Electrical Stimulation Location B UT    Electrical Stimulation Action IFC    Electrical Stimulation Parameters intensity to pt tolerance    Electrical Stimulation Goals Tone;Pain                    PT Short Term Goals - 05/04/20 1408      PT SHORT TERM GOAL #1   Title Patient to be  independent with initial HEP.    Time 5    Period Weeks    Status Achieved    Target Date 03/31/20             PT Long Term Goals - 05/27/20 1450      PT LONG TERM GOAL #1   Title Patient to be independent with advanced HEP.    Time 8    Period Weeks    Status On-going   met for current     PT LONG TERM GOAL #2   Title Patient to demonstrate B shoulder, elbow, wrist strength >/=4/5 and grip strength >10 lbs.    Time 8    Period Weeks    Status On-going   revealed improvement in B elbow extension and L elbow flexion and R wrist flexion/extension; shoulder strength limited     PT LONG TERM GOAL #3   Title Patient to demonstrate B shoulder AROM WFL and with mild pain remaining.    Time 8    Period Weeks    Status On-going   improved in B shoulder flexion,     PT LONG TERM GOAL #4   Title Patient to report tolerance for 30 minutes on her feet without pain limiting.    Time 8    Period Weeks    Status Partially Met   reports tolerance for 15-20 minutes of standing and walking; unable to stand still for that long     PT LONG TERM GOAL #5   Title Patient to report 80% improvement in sleeping tolerance.    Time 8    Period Weeks    Status Partially Met   reporting 50% improvement in sleeping tolerance with help of supportive pillow     PT LONG TERM GOAL #6   Title Patient to demonstrate B LE strength >/=4/5.    Time 8    Period Weeks    Status On-going                 Plan - 06/01/20 1819    Clinical Impression Statement Pt had a good response, cues were given to isolate shoulder muscles and to prevent substitutions with scapular muscles.  Pt reported that the massage from last session helped as she is feeling a lot less tightness in her upper shoulders. Pt still having trouble with foward reaching with her  R arm but she noted that she has gotten better with it lately. She responded well to estim and heat from last visit, administered at the end of session to  address pain and increase tissue extensbility.    Personal Factors and Comorbidities Age;Comorbidity 3+;Fitness;Past/Current Experience;Time since onset of injury/illness/exacerbation    Comorbidities stroke 2018, peripheral neuropathy, multiple myeloma, HTN, hiatal hernia, GERD, HLD, anemia    PT Frequency 2x / week    PT Duration 8 weeks    PT Treatment/Interventions ADLs/Self Care Home Management;Cryotherapy;Electrical Stimulation;Iontophoresis 4m/ml Dexamethasone;Moist Heat;Traction;Balance training;Therapeutic exercise;Therapeutic activities;Functional mobility training;Stair training;Gait training;DME Instruction;Ultrasound;Neuromuscular re-education;Patient/family education;Manual techniques;Vasopneumatic Device;Taping;Energy conservation;Dry needling;Passive range of motion    PT Next Visit Plan progress UE and LE strength to tolerance    Consulted and Agree with Plan of Care Patient           Patient will benefit from skilled therapeutic intervention in order to improve the following deficits and impairments:     Visit Diagnosis: Chronic right shoulder pain  Stiffness of right shoulder, not elsewhere classified  Chronic left shoulder pain  Stiffness of left shoulder, not elsewhere classified  Pain in left arm  Pain in right arm     Problem List Patient Active Problem List   Diagnosis Date Noted  . Ganglion cyst of volar aspect of left wrist 05/20/2020  . Trigger thumb of left hand 05/20/2020  . Labral tear of shoulder, degenerative, right 05/20/2020  . Anxiety 02/05/2019  . Oral thrush 10/08/2018  . Bilateral leg pain 10/08/2018  . Constipation 10/08/2018  . Shortness of breath 10/08/2018  . 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 B12 deficiency 12/19/2016  . Chest pain 12/18/2016  . Cerebellar infarct ([place]   . Diastolic dysfunction   . Marijuana abuse   . History of tobacco abuse   . Benign essential HTN   . Tachycardia   .  Hypokalemia   . Stroke (cerebrum) ([place] 12/05/2016  . Nonintractable headache 12/05/2016  . [place]ipital stroke ([place] 12/05/2016  . Hypertensive urgency 12/05/2016  . Hypertension     BArtist Pais PTA 06/01/2020, 6:27 PM  [facility] [place] [address]Phone: [phone]  Fax:  [phone] Name: JHeleena MiceliMRN: [id number] of Birth: [birth year]  PHYSICAL THERAPY DISCHARGE SUMMARY  Visits from Start of Care: 12  Current functional level related to goals / functional outcomes: Unable to assess; patient did not return   Remaining deficits: Weakness, decreased ROM, decreased functional activity tolerance   Education / Equipment: HEP  Plan: Patient agrees to discharge.  Patient goals were not met. Patient is being discharged due to not returning since the last visit.  ?????     YJanene Harvey PT, DPT 07/05/20 1:53 PM

## 2020-06-04 ENCOUNTER — Ambulatory Visit: Payer: Medicaid Other | Admitting: Physical Therapy

## 2020-06-24 ENCOUNTER — Ambulatory Visit: Payer: Medicaid Other | Admitting: Family Medicine

## 2020-07-01 ENCOUNTER — Ambulatory Visit: Payer: Medicaid Other | Admitting: Family Medicine

## 2020-07-01 NOTE — Progress Notes (Deleted)
Brittany [name] - 55 y.o. female MRN [id number]  Date of birth: 1965-09-29  SUBJECTIVE:  Including CC & ROS.  No chief complaint on file.   Brittany [name] is a 55 y.o. female that is  ***.  ***   Review of Systems See HPI   HISTORY: Past Medical, Surgical, Social, and Family History Reviewed & Updated per EMR.   Pertinent Historical Findings include:  Past Medical History:  Diagnosis Date  . Anemia   . Arthritis    trigger finger in left hand  . Complication of anesthesia    per pt, hard to wake up!  . Elevated cholesterol   . Female bladder prolapse    per urologist, does not have prolaspe  . GERD (gastroesophageal reflux disease)   . Heart murmur    pt unsure.  . Hiatal hernia   . Hypertension   . IBS (irritable bowel syndrome)   . Multiple myeloma ([place]) 2005   had partial chemo  . Peripheral neuropathy   . SOB (shortness of breath) on exertion    uses an inhaler  . Stroke [facility]) 2017   paralysis left arm/uses a walker    Past Surgical History:  Procedure Laterality Date  . BONE BIOPSY  2005   in her back  . COLONOSCOPY     over 10 years x3  . ESOPHAGOGASTRODUODENOSCOPY     incomplete-over 10 years ago   . TEE WITHOUT CARDIOVERSION N/A 12/08/2016   Procedure: TRANSESOPHAGEAL ECHOCARDIOGRAM (TEE);  Surgeon: Sueanne Margarita, MD;  Location: [facility] ENDOSCOPY;  Service: Cardiovascular;  Laterality: N/A;  . TUBAL LIGATION    . UPPER GASTROINTESTINAL ENDOSCOPY    . WISDOM TOOTH EXTRACTION      Family History  Problem Relation Age of Onset  . Hypertension Mother   . Sarcoidosis Mother        currently in remission   . Diverticulitis Mother   . Irritable bowel syndrome Mother   . Liver cancer Mother   . Hypertension Father   . Stomach cancer Father   . Congestive Heart Failure Father   . Stroke Maternal Uncle   . Scoliosis Brother   . Colon cancer Neg Hx   . Esophageal cancer Neg Hx   . Colon polyps Neg Hx   . Rectal cancer Neg Hx     Social History    Socioeconomic History  . Marital status: Legally Separated    Spouse name: Not on file  . Number of children: Not on file  . Years of education: Not on file  . Highest education level: Not on file  Occupational History  . Occupation: Disabled  Tobacco Use  . Smoking status: Former Smoker    Quit date: 12/06/2015    Years since quitting: 4.5  . Smokeless tobacco: Never Used  Vaping Use  . Vaping Use: Never used  Substance and Sexual Activity  . Alcohol use: Not Currently  . Drug use: Not Currently    Types: Marijuana    Comment: in the past   . Sexual activity: Not Currently  Other Topics Concern  . Not on file  Social History Narrative  . Not on file   Social Determinants of Health   Financial Resource Strain: Not on file  Food Insecurity: Not on file  Transportation Needs: Not on file  Physical Activity: Not on file  Stress: Not on file  Social Connections: Not on file  Intimate Partner Violence: Not on file     PHYSICAL EXAM:  VS: There were no vitals taken for this visit. Physical Exam Gen: NAD, alert, cooperative with exam, well-appearing MSK:  ***      ASSESSMENT & PLAN:   No problem-specific Assessment & Plan notes found for this encounter.

## 2020-07-06 ENCOUNTER — Other Ambulatory Visit: Payer: Self-pay

## 2020-07-06 ENCOUNTER — Encounter: Payer: Self-pay | Admitting: Family Medicine

## 2020-07-06 ENCOUNTER — Ambulatory Visit (INDEPENDENT_AMBULATORY_CARE_PROVIDER_SITE_OTHER): Payer: Medicaid Other | Admitting: Family Medicine

## 2020-07-06 DIAGNOSIS — M67432 Ganglion, left wrist: Secondary | ICD-10-CM

## 2020-07-06 DIAGNOSIS — M24111 Other articular cartilage disorders, right shoulder: Secondary | ICD-10-CM | POA: Diagnosis present

## 2020-07-06 NOTE — Progress Notes (Signed)
Brittany [name] - 55 y.o. female MRN [id number]  Date of birth: [birth year]  SUBJECTIVE:  Including CC & ROS.  No chief complaint on file.   Brittany [name] is a 55 y.o. female that is following up for her wrist and shoulder pain.  She did get improvement with the Pennsaid.  She is still having issues with a cyst in her left wrist.   Review of Systems See HPI   HISTORY: Past Medical, Surgical, Social, and Family History Reviewed & Updated per EMR.   Pertinent Historical Findings include:  Past Medical History:  Diagnosis Date  . Anemia   . Arthritis    trigger finger in left hand  . Complication of anesthesia    per pt, hard to wake up!  . Elevated cholesterol   . Female bladder prolapse    per urologist, does not have prolaspe  . GERD (gastroesophageal reflux disease)   . Heart murmur    pt unsure.  . Hiatal hernia   . Hypertension   . IBS (irritable bowel syndrome)   . Multiple myeloma ([place]) 2005   had partial chemo  . Peripheral neuropathy   . SOB (shortness of breath) on exertion    uses an inhaler  . Stroke [facility]) 2017   paralysis left arm/uses a walker    Past Surgical History:  Procedure Laterality Date  . BONE BIOPSY  2005   in her back  . COLONOSCOPY     over 10 years x3  . ESOPHAGOGASTRODUODENOSCOPY     incomplete-over 10 years ago   . TEE WITHOUT CARDIOVERSION N/A 12/08/2016   Procedure: TRANSESOPHAGEAL ECHOCARDIOGRAM (TEE);  Surgeon: Sueanne Margarita, MD;  Location: [facility] ENDOSCOPY;  Service: Cardiovascular;  Laterality: N/A;  . TUBAL LIGATION    . UPPER GASTROINTESTINAL ENDOSCOPY    . WISDOM TOOTH EXTRACTION      Family History  Problem Relation Age of Onset  . Hypertension Mother   . Sarcoidosis Mother        currently in remission   . Diverticulitis Mother   . Irritable bowel syndrome Mother   . Liver cancer Mother   . Hypertension Father   . Stomach cancer Father   . Congestive Heart Failure Father   . Stroke Maternal Uncle   . Scoliosis  Brother   . Colon cancer Neg Hx   . Esophageal cancer Neg Hx   . Colon polyps Neg Hx   . Rectal cancer Neg Hx     Social History   Socioeconomic History  . Marital status: Legally Separated    Spouse name: Not on file  . Number of children: Not on file  . Years of education: Not on file  . Highest education level: Not on file  Occupational History  . Occupation: Disabled  Tobacco Use  . Smoking status: Former Smoker    Quit date: 12/06/2015    Years since quitting: 4.5  . Smokeless tobacco: Never Used  Vaping Use  . Vaping Use: Never used  Substance and Sexual Activity  . Alcohol use: Not Currently  . Drug use: Not Currently    Types: Marijuana    Comment: in the past   . Sexual activity: Not Currently  Other Topics Concern  . Not on file  Social History Narrative  . Not on file   Social Determinants of Health   Financial Resource Strain: Not on file  Food Insecurity: Not on file  Transportation Needs: Not on file  Physical Activity: Not on file  Stress: Not on file  Social Connections: Not on file  Intimate Partner Violence: Not on file     PHYSICAL EXAM:  VS: BP (!) 186/90 (BP Location: Left Arm, Patient Position: Sitting, Cuff Size: Large)   Ht _0  (1.549 m)   Wt 136 lb (61.7 kg)   BMI 25.70 kg/m  Physical Exam Gen: NAD, alert, cooperative with exam, well-appearing   ASSESSMENT & PLAN:   Labral tear of shoulder, degenerative, right Did get improvement with PT and pennsaid  - counseled on home exercise therapy and supportive care - continue PT  - provdid pennsaid smaples.   Ganglion cyst of volar aspect of left wrist Still having some issues with the cyst.  She wants to wait on the aspiration as of now. -Counseled supportive care. -Can aspirate going forward

## 2020-07-06 NOTE — Assessment & Plan Note (Signed)
Did get improvement with PT and pennsaid  - counseled on home exercise therapy and supportive care - continue PT  - provdid pennsaid smaples.

## 2020-07-06 NOTE — Progress Notes (Signed)
Medication Samples have been provided to the patient.  Drug name: Pennsaid       Strength: 2%      Qty: 2 boxes  BTD:[id number]  Exp.Date: 08/2021 Dosing instructions: use a pea sized amount twice daily The patient has been instructed regarding the correct time, dose, and frequency of taking this medication, including desired effects and most common side effects.   Brittany [name] 2:26 PM 07/06/2020

## 2020-07-06 NOTE — Patient Instructions (Signed)
Good to see you Please continue physical therapy  Please try the pennsaid   Please send me a message in MyChart with any questions or updates.  Please follow up when you want to drain the cyst.   --Dr. Raeford Razor

## 2020-07-06 NOTE — Assessment & Plan Note (Addendum)
Still having some issues with the cyst.  She wants to wait on the aspiration as of now. -Counseled supportive care. -Can aspirate going forward

## 2020-08-03 ENCOUNTER — Ambulatory Visit: Payer: Medicaid Other | Admitting: Family Medicine

## 2020-08-06 ENCOUNTER — Ambulatory Visit: Payer: Medicaid Other | Admitting: Nurse Practitioner

## 2020-09-03 ENCOUNTER — Telehealth: Payer: Self-pay | Admitting: Family Medicine

## 2020-09-03 NOTE — Telephone Encounter (Signed)
..   Medicaid Managed Care   Unsuccessful Outreach Note  09/03/2020 Name: Brittany [name] MRN: [id number] DOB: [birth year]  Referred by: Azzie Glatter, FNP (Inactive) Reason for referral : High Risk Managed Medicaid (Called [name] today to get her scheduled with the [facility] Team. I left her a message on her VM.)   An unsuccessful telephone outreach was attempted today. The patient was referred to the case management team for assistance with care management and care coordination.   Follow Up Plan: The care management team will reach out to the patient again over the next 7-14 days.   [place]

## 2020-09-22 ENCOUNTER — Other Ambulatory Visit: Payer: Self-pay | Admitting: *Deleted

## 2020-09-22 ENCOUNTER — Telehealth: Payer: Self-pay | Admitting: Family Medicine

## 2020-09-22 ENCOUNTER — Other Ambulatory Visit: Payer: Self-pay

## 2020-09-22 DIAGNOSIS — Z139 Encounter for screening, unspecified: Secondary | ICD-10-CM

## 2020-09-22 NOTE — Patient Outreach (Signed)
Medicaid Managed Care   Nurse Care Manager Note  09/22/2020 Name:  Brittany [name] MRN:  [id number] DOB:  1965-10-05  Brittany [name] is an 55 y.o. year old female who is a primary patient of Azzie Glatter, FNP (Inactive).  The [facility] Managed Care Coordination team was consulted for assistance with:    HTN Chronic pain  Ms. Deberry was given information about Medicaid Managed Care Coordination team services today. [name] agreed to services and verbal consent obtained.  Engaged with patient by telephone for initial visit in response to provider referral for case management and/or care coordination services.   Assessments/Interventions:  Review of past medical history, allergies, medications, health status, including review of consultants reports, laboratory and other test data, was performed as part of comprehensive evaluation and provision of chronic care management services.  SDOH (Social Determinants of Health) assessments and interventions performed:   Care Plan  Allergies  Allergen Reactions   Amitriptyline Other (See Comments)    Patient reported that it made her throat feel like its locking up and it also caused issues with her going to the bathroom   Duloxetine Other (See Comments)    Patient reported that it made her throat lock up and it caused her to have issue with going to the bathroom   Naproxen     Vomiting, sweating, abd spasms   Other     States can't take pain meds that end in "cet" or meds that end in "ine" Darvocet/severe vomiting   Darvon [Propoxyphene] Nausea And Vomiting   Hydrocodone Nausea And Vomiting   Lactose Intolerance (Gi)     Bloating, gas, abd pain   Latex Itching and Rash   Oxycodone Nausea And Vomiting   Percocet [Oxycodone-Acetaminophen] Nausea And Vomiting   Topamax [Topiramate]     Memory made her emotional     Medications Reviewed Today     Reviewed by Melissa Montane, RN (Registered Nurse) on 09/22/20 at 1321  Med List  Status: <None>   Medication Order Taking? Sig Documenting Provider Last Dose Status Informant  albuterol (VENTOLIN HFA) 108 (90 Base) MCG/ACT inhaler [id number] No Inhale 2 puffs into the lungs every 4 (four) hours as needed for wheezing or shortness of breath.  Patient not taking: Reported on 09/22/2020   Azzie Glatter, FNP Not Taking Active   amLODipine (NORVASC) 10 MG tablet [id number] Yes Take 1 tablet (10 mg total) by mouth daily. Azzie Glatter, FNP Taking Active   aspirin 325 MG tablet [id number] Yes Take 1 tablet (325 mg total) by mouth daily. Cristal Ford, DO Taking Active Self  atorvastatin (LIPITOR) 40 MG tablet [id number] Yes Take 1 tablet (40 mg total) by mouth daily. Azzie Glatter, FNP Taking Active   cyanocobalamin 1000 MCG tablet [id number] No Take 1 tablet (1,000 mcg total) by mouth daily.  Patient not taking: No sig reported   Azzie Glatter, FNP Not Taking Active   dimenhyDRINATE (DRAMAMINE) 50 MG tablet [id number] No Take 50 mg by mouth every 8 (eight) hours as needed for dizziness.  Patient not taking: No sig reported   [provider] Not Taking Active Self           Med Note [name] Dome Aug 04, 2019  2:20 PM) As needed.   docusate sodium (COLACE) 100 MG capsule [id number] Yes Take 200 mg by mouth 2 (two) times daily. [provider] Taking Active Self  Ergocalciferol (VITAMIN D2) 10 MCG (400 UNIT)  TABS [id number] Yes Take 10 mcg by mouth daily. Azzie Glatter, FNP Taking Active   furosemide (LASIX) 20 MG tablet [id number] No Take 1 tablet (20 mg total) by mouth 2 (two) times daily as needed.  Patient not taking: Reported on 09/22/2020   Azzie Glatter, FNP Not Taking Active   gabapentin (NEURONTIN) 300 MG capsule [id number] Yes Take 1 capsule (300 mg total) by mouth 3 (three) times daily. Azzie Glatter, FNP Taking Active   hydrOXYzine (VISTARIL) 50 MG capsule [id number] No Take 1-2 capsules (50-100 mg total) by mouth 3 (three)  times daily as needed for itching.  Patient not taking: Reported on 09/22/2020   Azzie Glatter, FNP Not Taking Active   methocarbamol (ROBAXIN) 500 MG tablet [id number] No Take 1 tablet (500 mg total) by mouth 3 (three) times daily.  Patient not taking: Reported on 09/22/2020   Azzie Glatter, FNP Not Taking Active   Multiple Vitamin (MULTIVITAMIN) capsule [id number] Yes Take 4 capsules by mouth 2 (two) times daily. Centrum plus [provider] Taking Active Self  NIFEdipine (ADALAT CC) 30 MG 24 hr tablet [id number] No Take 2 tablets (60 mg total) by mouth daily.  Patient not taking: Reported on 09/22/2020   Azzie Glatter, FNP Not Taking Active   omeprazole (PRILOSEC) 20 MG capsule [id number] No Take 1 capsule (20 mg total) by mouth 2 (two) times daily before a meal.  Patient not taking: Reported on 09/22/2020   Azzie Glatter, FNP Not Taking Active   senna (SENOKOT) 8.6 MG TABS tablet [id number] No Take 2 tablets (17.2 mg total) by mouth 2 (two) times daily.  Patient not taking: Reported on 09/22/2020   Azzie Glatter, FNP Not Taking Active   triamcinolone cream (KENALOG) 0.1 % [id number] No Apply 1 application topically 2 (two) times daily.  Patient not taking: No sig reported   Scot Jun, FNP Not Taking Active            Med Note Azzie Glatter   Mon Aug 04, 2019  2:21 PM) As needed.             Patient Active Problem List   Diagnosis Date Noted   Ganglion cyst of volar aspect of left wrist 05/20/2020   Trigger thumb of left hand 05/20/2020   Labral tear of shoulder, degenerative, right 05/20/2020   Anxiety 02/05/2019   Oral thrush 10/08/2018   Bilateral leg pain 10/08/2018   Constipation 10/08/2018   Shortness of breath 10/08/2018   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   B12 deficiency 12/19/2016   Chest pain 12/18/2016   Cerebellar infarct ([place])    Diastolic dysfunction    Marijuana abuse    History of tobacco abuse     Benign essential HTN    Tachycardia    Hypokalemia    Stroke (cerebrum) ([place]) 12/05/2016   Nonintractable headache 12/05/2016   Occipital stroke ([place]) 12/05/2016   Hypertensive urgency 12/05/2016   Hypertension     Conditions to be addressed/monitored per PCP order:  HTN and chronic pain  Care Plan : Adult Health Management  Updates made by Melissa Montane, RN since 09/22/2020 12:00 AM     Problem: Chronic Pain Management (Chronic Pain)      Long-Range Goal: Chronic Pain Managed   Start Date: 09/22/2020  Expected End Date: 12/27/2020  This Visit's Progress: On track  Priority: High  Note:   Current Barriers:  Knowledge Deficits related to  self-health management of acute or chronic pain Chronic Disease Management support and education needs related to chronic pain-Ms. Dant has an appointment with a new PCP. She would like to discuss several concerns with her new PCP(new pain in both feet, needing a shower chair and BP monitor). She does not take all of her medications due to financial strain. She uses her monthly income to pay rent, insurance, power and gas. She has very little money left over. Chronic Disease Management support and education needs related to chronic pain Financial Constraints.  Does not adhere to prescribed medication regimen-Patient is unable to afford all of her medications Clinical Goal(s):  patient will verbalize understanding of plan for pain management. , patient will attend all scheduled medical appointments: 10/06/20 with new PCP, patient will demonstrate use of different relaxation  skills and/or diversional activities to assist with pain reduction (distraction, imagery, relaxation, massage, acupressure, TENS, heat, and cold application., and patient will use pharmacological and nonpharmacological pain relief strategies as prescribed.  Interventions:  Pain assessment performed Medications reviewed Discussed plans with patient for ongoing care management  follow up and provided patient with direct contact information for care management team Evaluation of current treatment plan related to pain and patient's adherence to plan as established by provider. Advised patient to make a list of things that she would like to discuss at her next appointment with new PCP Provided education to patient re: managing pain Reviewed medications with patient and discussed the importance of taking all medications as directed Referral to [facility] Pharmacist for medication management Referral to [place] for financial and food resources Patient Goals/Self Care Activities:  - make a list of things you want to discuss at new PCP appointment - develop a personal pain management plan - keep track of prescription refills - plan exercise or activity when pain is best controlled - start a pain diary - track what makes the pain worse and what makes it better - use ice or heat for pain relief - work slower and less intense when having pain  Follow Up Plan: Telephone follow up appointment with care management team member scheduled for:10/26/20 @ 1pm     Problem: Hypertension (Hypertension)      Long-Range Goal: Hypertension Monitored   Start Date: 09/22/2020  Expected End Date: 12/27/2020  This Visit's Progress: On track  Priority: High  Note:   Objective:  Last practice recorded BP readings:  BP Readings from Last 3 Encounters:  07/06/20 (!) 186/90  05/20/20 140/72  04/27/20 (!) 142/77   Most recent eGFR/CrCl: No results found for: EGFR  No components found for: CRCL  Current Barriers:  Knowledge Deficits related to basic understanding of hypertension pathophysiology and self care management Difficulty obtaining medications Non-adherence to prescribed medication regimen Financial Constraints.  Does not contact provider office for questions/concerns Case Manager Clinical Goal(s):  patient will verbalize understanding of plan for hypertension  management patient will attend all scheduled medical appointments: 10/06/20 with new PCP patient will demonstrate improved adherence to prescribed treatment plan for hypertension as evidenced by taking all medications as prescribed, monitoring and recording blood pressure as directed, adhering to low sodium/DASH diet Interventions:  Provided education to patient re: stroke prevention, s/s of heart attack and stroke, DASH diet, complications of uncontrolled blood pressure Reviewed medications with patient and discussed importance of compliance Discussed plans with patient for ongoing care management follow up and provided patient with direct contact information for care management team Reviewed scheduled/upcoming provider appointments including: 10/06/20  with new PCP Referral to [facility] Pharmacist for medication management Referral to [facility] Care Guide for assistance with financial and food resources Patient Goals/Self-Care Activities:  - Self administers medications as prescribed - Attends all scheduled provider appointments - Calls provider office for new concerns, questions, or BP outside discussed parameters - Checks BP and records as discussed - Follows a low sodium diet/DASH diet  RNCM will follow up with a telephone visit on 10/26/20 @ 1pm       Follow Up:  Patient agrees to Care Plan and Follow-up.  Plan: The Managed Medicaid care management team will reach out to the patient again over the next 30 days.  Date/time of next scheduled RN care management/care coordination outreach:  10/26/20 @ 1pm  Lurena Joiner RN, [place] RN Care Coordinator

## 2020-09-22 NOTE — Patient Instructions (Signed)
Visit Information  Brittany [name] was given information about Medicaid Managed Care team care coordination services as a part of their [place] Medicaid benefit. Brittany [name] verbally consented to engagement with the [facility] Managed Care team.   For questions related to your [facility], please call: [phone] or visit the homepage here: [email or web address]  If you would like to schedule transportation through your [facility], please call the following number at least 2 days in advance of your appointment: [phone].   Call the [place] at [phone], at any time, 24 hours a day, 7 days a week. If you are in danger or need immediate medical attention call 911.  If you would like help to quit smoking, call 1-800-QUIT-NOW 757-485-6883) OR Espaol: 1-855-Djelo-Ya (2-119-417-4081) o para ms informacin haga clic aqu or Text READY to 200-400 to register via text  Brittany [name] - following are the goals we discussed in your visit today:   Goals Addressed             This Visit's Progress    Manage Chronic Pain       Timeframe:  Long-Range Goal Priority:  High Start Date:   09/22/20                          Expected End Date:  12/27/20                       Follow Up Date 09/22/2020    - make a list of things you want to discuss at new PCP appointment - develop a personal pain management plan - keep track of prescription refills - plan exercise or activity when pain is best controlled - start a pain diary - track what makes the pain worse and what makes it better - use ice or heat for pain relief - work slower and less intense when having pain    Why is this important?   Day-to-day life can be hard when you have chronic pain.  Pain medicine is just one piece of the treatment puzzle.  You can try these action steps to help you  manage your pain.    Notes:       Track and Manage My Blood Pressure-Hypertension       Timeframe:  Long-Range Goal Priority:  High Start Date: 09/22/20                            Expected End Date:  12/27/20                     Follow Up Date 10/26/20   - Self administers medications as prescribed - Attends all scheduled provider appointments - Calls provider office for new concerns, questions, or BP outside discussed parameters - Checks BP and records as discussed - Follows a low sodium diet/DASH diet     Why is this important?   You won't feel high blood pressure, but it can still hurt your blood vessels.  High blood pressure can cause heart or kidney problems. It can also cause a stroke.  Making lifestyle changes like losing a little weight or eating less salt will help.  Checking your blood pressure at home and at different times of the day can help to control blood pressure.  If the doctor prescribes medicine remember to take  it the way the doctor ordered.  Call the office if you cannot afford the medicine or if there are questions about it.              Please see education materials related to pain and diet provided as print materials.   The patient verbalized understanding of instructions provided today and agreed to receive a mailed copy of patient instruction and/or educational materials.  Telephone follow up appointment with Managed Medicaid care management team member scheduled for:10/26/20 @ [place] RN, [place] RN Care Coordinator   Following is a copy of your plan of care:  Patient Care Plan: Adult Health Management     Problem Identified: Chronic Pain Management (Chronic Pain)      Long-Range Goal: Chronic Pain Managed   Start Date: 09/22/2020  Expected End Date: 12/27/2020  This Visit's Progress: On track  Priority: High  Note:   Current Barriers:  Knowledge Deficits related to self-health management of  acute or chronic pain Chronic Disease Management support and education needs related to chronic pain-Brittany [name] has an appointment with a new PCP. She would like to discuss several concerns with her new PCP(new pain in both feet, needing a shower chair and BP monitor). She does not take all of her medications due to financial strain. She uses her monthly income to pay rent, insurance, power and gas. She has very little money left over. Chronic Disease Management support and education needs related to chronic pain Financial Constraints.  Does not adhere to prescribed medication regimen-Patient is unable to afford all of her medications Clinical Goal(s):  patient will verbalize understanding of plan for pain management. , patient will attend all scheduled medical appointments: 10/06/20 with new PCP, patient will demonstrate use of different relaxation  skills and/or diversional activities to assist with pain reduction (distraction, imagery, relaxation, massage, acupressure, TENS, heat, and cold application., and patient will use pharmacological and nonpharmacological pain relief strategies as prescribed.  Interventions:  Pain assessment performed Medications reviewed Discussed plans with patient for ongoing care management follow up and provided patient with direct contact information for care management team Evaluation of current treatment plan related to pain and patient's adherence to plan as established by provider. Advised patient to make a list of things that she would like to discuss at her next appointment with new PCP Provided education to patient re: managing pain Reviewed medications with patient and discussed the importance of taking all medications as directed Referral to [facility] Pharmacist for medication management Referral to [place] for financial and food resources Patient Goals/Self Care Activities:  - make a list of things you want to discuss at new PCP appointment - develop a  personal pain management plan - keep track of prescription refills - plan exercise or activity when pain is best controlled - start a pain diary - track what makes the pain worse and what makes it better - use ice or heat for pain relief - work slower and less intense when having pain  Follow Up Plan: Telephone follow up appointment with care management team member scheduled for:10/26/20 @ 1pm     Problem Identified: Hypertension (Hypertension)      Long-Range Goal: Hypertension Monitored   Start Date: 09/22/2020  Expected End Date: 12/27/2020  This Visit's Progress: On track  Priority: High  Note:   Objective:  Last practice recorded BP readings:  BP Readings from Last 3 Encounters:  07/06/20 (!) 186/90  05/20/20 140/72  04/27/20 (!) 142/77   Most recent eGFR/CrCl: No results found for: EGFR  No components found for: CRCL  Current Barriers:  Knowledge Deficits related to basic understanding of hypertension pathophysiology and self care management Difficulty obtaining medications Non-adherence to prescribed medication regimen Financial Constraints.  Does not contact provider office for questions/concerns Case Manager Clinical Goal(s):  patient will verbalize understanding of plan for hypertension management patient will attend all scheduled medical appointments: 10/06/20 with new PCP patient will demonstrate improved adherence to prescribed treatment plan for hypertension as evidenced by taking all medications as prescribed, monitoring and recording blood pressure as directed, adhering to low sodium/DASH diet Interventions:  Provided education to patient re: stroke prevention, s/s of heart attack and stroke, DASH diet, complications of uncontrolled blood pressure Reviewed medications with patient and discussed importance of compliance Discussed plans with patient for ongoing care management follow up and provided patient with direct contact information for care management  team Reviewed scheduled/upcoming provider appointments including: 10/06/20 with new PCP Referral to [facility] Pharmacist for medication management Referral to [place] for assistance with financial and food resources Patient Goals/Self-Care Activities:  - Self administers medications as prescribed - Attends all scheduled provider appointments - Calls provider office for new concerns, questions, or BP outside discussed parameters - Checks BP and records as discussed - Follows a low sodium diet/DASH diet  RNCM will follow up with a telephone visit on 10/26/20 @ 1pm

## 2020-09-22 NOTE — Telephone Encounter (Signed)
..   Medicaid Managed Care   Unsuccessful Outreach Note  09/22/2020 Name: Brittany [name] MRN: [id number] DOB: June 11, 1965  Referred by: Azzie Glatter, FNP (Inactive) Reason for referral : High Risk Managed Medicaid (Attempted to reach the patient today to get her scheduled for a phone visit with the [facility] Pharmacist.I left my name and number for her to call me back.)   An unsuccessful telephone outreach was attempted today. The patient was referred to the case management team for assistance with care management and care coordination.   Follow Up Plan: The care management team will reach out to the patient again over the next 7-14 days.   [name]

## 2020-09-24 ENCOUNTER — Telehealth: Payer: Self-pay | Admitting: *Deleted

## 2020-09-24 NOTE — Telephone Encounter (Signed)
   Telephone encounter was:  Unsuccessful.  09/24/2020 Name: Brittany [name] MRN: [id number] DOB: 10/02/1965  Unsuccessful outbound call made today to assist with:  Food Insecurity  Outreach Attempt:  1st Attempt  A HIPAA compliant voice message was left requesting a return call.  Instructed patient to call back at   Instructed patient to call back at [phone]  at their earliest convenience. .  [place], Care Management  [phone] [address] , [place] [address] Email : [email or web address]

## 2020-09-27 ENCOUNTER — Telehealth: Payer: Self-pay | Admitting: *Deleted

## 2020-09-27 NOTE — Telephone Encounter (Signed)
   Telephone encounter was:  Successful.  09/27/2020 Name: Brittany [name] MRN: [id number] DOB: 02-03-1966  Brittany [name] is a 55 y.o. year old female who is a primary care patient of Azzie Glatter, FNP (Inactive) . The community resource team was consulted for assistance with [place] guide performed the following interventions: Patient provided with information about care guide support team and interviewed to confirm resource needs.Lives alone and needs food and trouble  to pay bills emailed all requested information on food resources   Follow Up Plan:  No further follow up planned at this time. The patient has been provided with needed resources.  [place], Care Management  [phone] [address] , [place] [address] Email : [email or web address]

## 2020-09-29 ENCOUNTER — Telehealth: Payer: Self-pay | Admitting: Family Medicine

## 2020-09-29 ENCOUNTER — Telehealth: Payer: Self-pay

## 2020-09-29 NOTE — Telephone Encounter (Signed)
Potassium

## 2020-09-29 NOTE — Telephone Encounter (Signed)
..   Medicaid Managed Care   Unsuccessful Outreach Note  09/29/2020 Name: Brittany [name] MRN: [id number] DOB: 09/12/1965  Referred by: Azzie Glatter, FNP (Inactive) Reason for referral : High Risk Managed Medicaid (Attempted to reach patient today to get her scheduled for a phone visit with the MM Pharmacist. I left my name and numberon her VM.)   A second unsuccessful telephone outreach was attempted today. The patient was referred to the case management team for assistance with care management and care coordination.   Follow Up Plan: The care management team will reach out to the patient again over the next 7-14 days.   [place]

## 2020-10-01 NOTE — Telephone Encounter (Signed)
Patient stated she will wait until her appointment to have labs drawn.

## 2020-10-01 NOTE — Telephone Encounter (Signed)
Called to speak to patient regarding refill needed for potassium. She is aware it was discontinued 01/2019 due to her finishing the dose prescribed. Patient stated she feel she needs it because her knees are swollen like "grapefruit" She does have a follow up appointment with you on next Wednesday 7/20.

## 2020-10-06 ENCOUNTER — Ambulatory Visit (INDEPENDENT_AMBULATORY_CARE_PROVIDER_SITE_OTHER): Payer: Medicaid Other | Admitting: Nurse Practitioner

## 2020-10-06 ENCOUNTER — Telehealth: Payer: Self-pay | Admitting: Family Medicine

## 2020-10-06 ENCOUNTER — Other Ambulatory Visit: Payer: Self-pay

## 2020-10-06 ENCOUNTER — Encounter: Payer: Self-pay | Admitting: Nurse Practitioner

## 2020-10-06 VITALS — BP 166/79 | HR 92 | Temp 97.6°F | Ht 61.0 in | Wt 142.1 lb

## 2020-10-06 DIAGNOSIS — G4700 Insomnia, unspecified: Secondary | ICD-10-CM | POA: Diagnosis not present

## 2020-10-06 DIAGNOSIS — Z1159 Encounter for screening for other viral diseases: Secondary | ICD-10-CM

## 2020-10-06 DIAGNOSIS — I10 Essential (primary) hypertension: Secondary | ICD-10-CM | POA: Diagnosis not present

## 2020-10-06 DIAGNOSIS — Z08 Encounter for follow-up examination after completed treatment for malignant neoplasm: Secondary | ICD-10-CM | POA: Diagnosis not present

## 2020-10-06 DIAGNOSIS — R609 Edema, unspecified: Secondary | ICD-10-CM

## 2020-10-06 DIAGNOSIS — M2550 Pain in unspecified joint: Secondary | ICD-10-CM

## 2020-10-06 DIAGNOSIS — Z85828 Personal history of other malignant neoplasm of skin: Secondary | ICD-10-CM | POA: Diagnosis not present

## 2020-10-06 DIAGNOSIS — G894 Chronic pain syndrome: Secondary | ICD-10-CM

## 2020-10-06 DIAGNOSIS — R7303 Prediabetes: Secondary | ICD-10-CM | POA: Diagnosis not present

## 2020-10-06 DIAGNOSIS — R0602 Shortness of breath: Secondary | ICD-10-CM

## 2020-10-06 LAB — POCT URINALYSIS DIPSTICK
Bilirubin, UA: NEGATIVE
Glucose, UA: NEGATIVE
Ketones, UA: NEGATIVE
Leukocytes, UA: NEGATIVE
Nitrite, UA: NEGATIVE
Protein, UA: NEGATIVE
Spec Grav, UA: 1.01 (ref 1.010–1.025)
Urobilinogen, UA: 0.2 E.U./dL
pH, UA: 6.5 (ref 5.0–8.0)

## 2020-10-06 LAB — POCT GLYCOSYLATED HEMOGLOBIN (HGB A1C): Hemoglobin A1C: 5.8 % — AB (ref 4.0–5.6)

## 2020-10-06 LAB — GLUCOSE, POCT (MANUAL RESULT ENTRY): POC Glucose: 122 mg/dl — AB (ref 70–99)

## 2020-10-06 MED ORDER — HYDROXYZINE PAMOATE 50 MG PO CAPS
50.0000 mg | ORAL_CAPSULE | Freq: Three times a day (TID) | ORAL | 3 refills | Status: DC | PRN
  Filled 2020-10-06: qty 90, 15d supply, fill #0

## 2020-10-06 MED ORDER — SENNA 8.6 MG PO TABS
2.0000 | ORAL_TABLET | Freq: Two times a day (BID) | ORAL | 3 refills | Status: AC
  Filled 2020-10-06: qty 360, 90d supply, fill #0

## 2020-10-06 MED ORDER — DICLOFENAC SODIUM 2 % EX SOLN
40.0000 mg | Freq: Two times a day (BID) | CUTANEOUS | 0 refills | Status: AC | PRN
  Filled 2020-10-06: qty 112, 30d supply, fill #0

## 2020-10-06 MED ORDER — METHOCARBAMOL 500 MG PO TABS
500.0000 mg | ORAL_TABLET | Freq: Three times a day (TID) | ORAL | 3 refills | Status: DC
  Filled 2020-10-06: qty 39, 13d supply, fill #0

## 2020-10-06 MED ORDER — FUROSEMIDE 20 MG PO TABS
20.0000 mg | ORAL_TABLET | Freq: Two times a day (BID) | ORAL | 3 refills | Status: DC | PRN
  Filled 2020-10-06: qty 60, 30d supply, fill #0

## 2020-10-06 MED ORDER — GABAPENTIN 300 MG PO CAPS
300.0000 mg | ORAL_CAPSULE | Freq: Three times a day (TID) | ORAL | 3 refills | Status: DC
  Filled 2020-10-06: qty 90, 30d supply, fill #0

## 2020-10-06 MED ORDER — BLOOD PRESSURE MONITOR KIT
1.0000 | PACK | Freq: Every day | 0 refills | Status: AC
  Filled 2020-10-06: qty 1, fill #0

## 2020-10-06 MED ORDER — ATORVASTATIN CALCIUM 40 MG PO TABS
40.0000 mg | ORAL_TABLET | Freq: Every day | ORAL | 3 refills | Status: DC
  Filled 2020-10-06: qty 90, 90d supply, fill #0

## 2020-10-06 MED ORDER — AMLODIPINE BESYLATE 10 MG PO TABS
10.0000 mg | ORAL_TABLET | Freq: Every day | ORAL | 3 refills | Status: DC
  Filled 2020-10-06: qty 30, 30d supply, fill #0

## 2020-10-06 MED ORDER — ALBUTEROL SULFATE HFA 108 (90 BASE) MCG/ACT IN AERS
2.0000 | INHALATION_SPRAY | RESPIRATORY_TRACT | 11 refills | Status: DC | PRN
  Filled 2020-10-06: qty 8.5, 17d supply, fill #0

## 2020-10-06 NOTE — Telephone Encounter (Signed)
Pt's LOV 07/06/20 --per patient this topical ointment works & she as for more samples :   Medication Samples have been provided to the patient.   Drug name: Pennsaid       Strength: 2%      Qty: 2 boxes  TJQ:[id number]                 Exp.Date: 08/2021 Dosing instructions: use a pea sized amount twice daily The patient has been instructed regarding the correct time, dose, and frequency of taking this medication, including desired effects and most common side effects.   --Forwarding request to med asst for  review w/provider is approved.  --glh

## 2020-10-06 NOTE — Progress Notes (Signed)
[place], [place]  [address] Phone:  [phone]   Fax:  [phone]   Established Patient Office Visit  Subjective:  Patient ID: Brittany [name], female    DOB: [birth year]  Age: 55 y.o. MRN: [id number]  CC:  Chief Complaint  Patient presents with   Follow-up    Requesting referral to dermatology for new "moles" on skin. Referral to podiatry due to dark toenails. Reqeusting refill and blood pressure machine.     HPI Brittany [name] presents for follow up. A former patient of NP Stroud. She  has a past medical history of Anemia, Arthritis, Complication of anesthesia, Elevated cholesterol, Female bladder prolapse, GERD (gastroesophageal reflux disease), Heart murmur, Hiatal hernia, Hypertension, IBS (irritable bowel syndrome), Multiple myeloma ([place]) (2005), Peripheral neuropathy, SOB (shortness of breath) on exertion, and Stroke ([place]) (2017).   Hypertension Patient is here for follow-up of elevated blood pressure. She is not exercising and is adherent to a low-salt diet. Cardiac symptoms: dyspnea and irregular heart beat. Patient denies chest pain and near-syncope. Cardiovascular risk factors: hypertension and sedentary lifestyle. Use of agents associated with hypertension: none. History of target organ damage: stroke.  She has chronic pain. Her mother has scardiosis and maternal grandmother had lupus.    Past Medical History:  Diagnosis Date   Anemia    Arthritis    trigger finger in left hand   Complication of anesthesia    per pt, hard to wake up!   Elevated cholesterol    Female bladder prolapse    per urologist, does not have prolaspe   GERD (gastroesophageal reflux disease)    Heart murmur    pt unsure.   Hiatal hernia    Hypertension    IBS (irritable bowel syndrome)    Multiple myeloma ([place]) 2005   had partial chemo   Peripheral neuropathy    SOB (shortness of breath) on exertion    uses an inhaler   Stroke [facility])  2017   paralysis left arm/uses a walker    Past Surgical History:  Procedure Laterality Date   BONE BIOPSY  2005   in her back   COLONOSCOPY     over 10 years x3   ESOPHAGOGASTRODUODENOSCOPY     incomplete-over 10 years ago    TEE WITHOUT CARDIOVERSION N/A 12/08/2016   Procedure: TRANSESOPHAGEAL ECHOCARDIOGRAM (TEE);  Surgeon: Sueanne Margarita, MD;  Location: [facility] ENDOSCOPY;  Service: Cardiovascular;  Laterality: N/A;   TUBAL LIGATION     UPPER GASTROINTESTINAL ENDOSCOPY     WISDOM TOOTH EXTRACTION      Family History  Problem Relation Age of Onset   Hypertension Mother    Sarcoidosis Mother        currently in remission    Diverticulitis Mother    Irritable bowel syndrome Mother    Liver cancer Mother    Hypertension Father    Stomach cancer Father    Congestive Heart Failure Father    Stroke Maternal Uncle    Scoliosis Brother    Colon cancer Neg Hx    Esophageal cancer Neg Hx    Colon polyps Neg Hx    Rectal cancer Neg Hx     Social History   Socioeconomic History   Marital status: Legally Separated    Spouse name: Not on file   Number of children: Not on file   Years of education: Not on file   Highest education level: Not on file  Occupational History  Occupation: Disabled  Tobacco Use   Smoking status: Former    Types: Cigarettes    Quit date: 12/06/2015    Years since quitting: 4.8   Smokeless tobacco: Never  Vaping Use   Vaping Use: Never used  Substance and Sexual Activity   Alcohol use: Not Currently   Drug use: Not Currently    Types: Marijuana    Comment: in the past    Sexual activity: Not Currently  Other Topics Concern   Not on file  Social History Narrative   Not on file   Social Determinants of Health   Financial Resource Strain: Not on file  Food Insecurity: Not on file  Transportation Needs: Not on file  Physical Activity: Not on file  Stress: Not on file  Social Connections: Not on file  Intimate Partner Violence: Not on file     Outpatient Medications Prior to Visit  Medication Sig Dispense Refill   albuterol (VENTOLIN HFA) 108 (90 Base) MCG/ACT inhaler Inhale 2 puffs into the lungs every 4 (four) hours as needed for wheezing or shortness of breath. 8 g 11   amLODipine (NORVASC) 10 MG tablet Take 1 tablet (10 mg total) by mouth daily. 30 tablet 3   aspirin 325 MG tablet Take 1 tablet (325 mg total) by mouth daily. 30 tablet 0   atorvastatin (LIPITOR) 40 MG tablet Take 1 tablet (40 mg total) by mouth daily. 30 tablet 3   cloNIDine (CATAPRES) 0.2 MG tablet Take by mouth.     cyanocobalamin 1000 MCG tablet Take 1 tablet (1,000 mcg total) by mouth daily. 30 tablet 4   Diclofenac Sodium 2 % SOLN Apply topically.     dimenhyDRINATE (DRAMAMINE) 50 MG tablet Take 50 mg by mouth every 8 (eight) hours as needed for dizziness.     docusate sodium (COLACE) 100 MG capsule Take 200 mg by mouth 2 (two) times daily.     Ergocalciferol (VITAMIN D2) 10 MCG (400 UNIT) TABS Take 10 mcg by mouth daily. 30 tablet 6   furosemide (LASIX) 20 MG tablet Take 1 tablet (20 mg total) by mouth 2 (two) times daily as needed. 60 tablet 3   gabapentin (NEURONTIN) 300 MG capsule Take 1 capsule (300 mg total) by mouth 3 (three) times daily. 90 capsule 3   hydrOXYzine (VISTARIL) 50 MG capsule Take 1-2 capsules (50-100 mg total) by mouth 3 (three) times daily as needed for itching. 90 capsule 3   methocarbamol (ROBAXIN) 500 MG tablet Take 1 tablet (500 mg total) by mouth 3 (three) times daily. 90 tablet 3   Multiple Vitamin (MULTIVITAMIN) capsule Take 4 capsules by mouth 2 (two) times daily. Centrum plus     NIFEdipine (ADALAT CC) 30 MG 24 hr tablet Take 2 tablets (60 mg total) by mouth daily. 60 tablet 3   omeprazole (PRILOSEC) 20 MG capsule Take 1 capsule (20 mg total) by mouth 2 (two) times daily before a meal. 60 capsule 3   senna (SENOKOT) 8.6 MG TABS tablet Take 2 tablets (17.2 mg total) by mouth 2 (two) times daily. 60 tablet 3   triamcinolone  cream (KENALOG) 0.1 % Apply 1 application topically 2 (two) times daily. 454 g 0   No facility-administered medications prior to visit.    Allergies  Allergen Reactions   Amitriptyline Other (See Comments)    Patient reported that it made her throat feel like its locking up and it also caused issues with her going to the bathroom   Duloxetine  Other (See Comments)    Patient reported that it made her throat lock up and it caused her to have issue with going to the bathroom   Naproxen     Vomiting, sweating, abd spasms   Other     States can't take pain meds that end in "cet" or meds that end in "ine" Darvocet/severe vomiting   Darvon [Propoxyphene] Nausea And Vomiting   Hydrocodone Nausea And Vomiting   Lactose Intolerance (Gi)     Bloating, gas, abd pain   Latex Itching and Rash   Oxycodone Nausea And Vomiting   Percocet [Oxycodone-Acetaminophen] Nausea And Vomiting   Topamax [Topiramate]     Memory made her emotional     ROS Review of Systems  HENT:  Positive for dental problem (unable to use dentures).   Respiratory:  Positive for shortness of breath (with activity).   Cardiovascular:  Positive for chest pain (occasional with activity).       Increased heart rate  Gastrointestinal: Negative.   Endocrine: Negative.   Genitourinary:        Urinary incontience  Musculoskeletal:  Positive for joint swelling (knee L>R).  Skin:        She has thick skin growing in her hand and her feet.    Allergic/Immunologic: Negative.      Objective:    Physical Exam  BP (!) 170/81 (BP Location: Left Arm, Patient Position: Sitting)   Pulse 88   Temp 97.6 F (36.4 C)   Ht _0  (1.549 m)   Wt 142 lb 0.8 oz (64.4 kg)   SpO2 98%   BMI 26.84 kg/m  Wt Readings from Last 3 Encounters:  10/06/20 142 lb 0.8 oz (64.4 kg)  07/06/20 136 lb (61.7 kg)  05/20/20 140 lb (63.5 kg)     Health Maintenance Due  Topic Date Due   COVID-19 Vaccine (1) Never done   Hepatitis C Screening   Never done   PAP SMEAR-Modifier  Never done   Zoster Vaccines- Shingrix (1 of 2) Never done    There are no preventive care reminders to display for this patient.  Lab Results  Component Value Date   TSH 2.740 02/04/2019   Lab Results  Component Value Date   WBC 5.9 02/04/2019   HGB 14.5 02/04/2019   HCT 43.3 02/04/2019   MCV 84 02/04/2019   PLT 289 02/04/2019   Lab Results  Component Value Date   NA 140 02/04/2019   K 4.6 02/04/2019   CO2 22 02/04/2019   GLUCOSE 94 02/04/2019   BUN 8 02/04/2019   CREATININE 0.89 02/04/2019   BILITOT 0.4 02/04/2019   ALKPHOS 187 (H) 02/04/2019   AST 22 02/04/2019   ALT 15 02/04/2019   PROT 8.1 02/04/2019   ALBUMIN 5.0 (H) 02/04/2019   CALCIUM 10.6 (H) 02/04/2019   ANIONGAP 15 10/15/2018   Lab Results  Component Value Date   CHOL 190 02/04/2019   Lab Results  Component Value Date   HDL 41 02/04/2019   Lab Results  Component Value Date   LDLCALC 120 (H) 02/04/2019   Lab Results  Component Value Date   TRIG 161 (H) 02/04/2019   Lab Results  Component Value Date   CHOLHDL 4.6 (H) 02/04/2019   Lab Results  Component Value Date   HGBA1C 5.8 (A) 10/06/2020      Assessment & Plan:   Problem List Items Addressed This Visit       Cardiovascular and Mediastinum  Hypertension - Primary Persistent Encouraged on going compliance with current medication regimen Encouraged home monitoring and recording BP <130/80 Eating a heart-healthy diet with less salt Encouraged regular physical activity  Recommend Weight loss     Relevant Medications   cloNIDine (CATAPRES) 0.2 MG tablet   amLODipine (NORVASC) 10 MG tablet   atorvastatin (LIPITOR) 40 MG tablet   furosemide (LASIX) 20 MG tablet   Other Relevant Orders   Urinalysis Dipstick (Completed)   Benign essential HTN   Relevant Medications   cloNIDine (CATAPRES) 0.2 MG tablet   amLODipine (NORVASC) 10 MG tablet   atorvastatin (LIPITOR) 40 MG tablet   furosemide  (LASIX) 20 MG tablet     Other   Shortness of breath Persistent however stable   Relevant Medications   albuterol (VENTOLIN HFA) 108 (90 Base) MCG/ACT inhaler   Other Visit Diagnoses     Peripheral edema   Persistent Encourage compliance with current treatment plan Work up to evaluate for Heart failure, hepatic and renal disease, venous insufficiency  Avoid sitting or staying in one position for long periods of time. Encourage movement Avoid eating too much salt Avoid NSAIDs Recommend elevation above heart level several times and day  Proper fitting compression socks or stocking Skin protection: keep skin clean, moisturized and free of injury    Relevant Medications   furosemide (LASIX) 20 MG tablet   Prediabetes     Persistent current A1c 5.8% Consider home glucose monitoring Lifestyle modification dietary changes and regular daily exercise Encourage blood pressure control goal <120/80 and maintaining total cholesterol <200 Follow-up every 3 to 6 months for reevaluation Education material provided    Relevant Orders   HgB A1c (Completed)   Glucose (CBG) (Completed)   Arthralgia, unspecified joint     Ongoing   Relevant Orders   Ambulatory referral to Podiatry   Uric Acid   Chronic pain syndrome       Relevant Medications   gabapentin (NEURONTIN) 300 MG capsule   methocarbamol (ROBAXIN) 500 MG tablet   Other Relevant Orders   Shower chair   Arthritis Panel (Completed)   Encounter for hepatitis C screening test for low risk patient       Relevant Orders   Hepatitis C antibody (Completed)   Encounter for follow-up surveillance of skin cancer       Relevant Orders   Ambulatory referral to Dermatology       No orders of the defined types were placed in this encounter.   Follow-up: No follow-ups on file.    Vevelyn Francois, NP

## 2020-10-06 NOTE — Telephone Encounter (Signed)
Medication Samples have been provided to the patient.  Drug name: Pennsaid       Strength: 2%        Qty: 2 boxes  LOT: [id number]  Exp.Date: 09/2021  Dosing instructions: use a pea sized amount  The patient has been instructed regarding the correct time, dose, and frequency of taking this medication, including desired effects and most common side effects.   Brittany [name] 5:04 PM 10/06/2020

## 2020-10-06 NOTE — Patient Instructions (Signed)

## 2020-10-07 LAB — ARTHRITIS PANEL
Anti Nuclear Antibody (ANA): NEGATIVE
Rheumatoid fact SerPl-aCnc: <10 IU/mL (ref <14.0)
Sed Rate: 15 mm/hr (ref 0–40)
Uric Acid: 4.4 mg/dL (ref 3.0–7.2)

## 2020-10-07 LAB — HEPATITIS C ANTIBODY: Hep C Virus Ab: <0.1 s/co ratio (ref 0.0–0.9)

## 2020-10-11 ENCOUNTER — Other Ambulatory Visit: Payer: Self-pay

## 2020-10-11 NOTE — Patient Outreach (Signed)
Medicaid Managed Care    Pharmacy Note  10/11/2020 Name: Brittany [name] MRN: [id number] DOB: 1966/02/02  Brittany [name] is a 55 y.o. year old female who is a primary care patient of Vevelyn Francois, NP. The [facility] Managed Care Coordination team was consulted for assistance with disease management and care coordination needs.    Engaged with patient Engaged with patient by telephone for initial visit in response to referral for case management and/or care coordination services.  [name] was given information about Managed Medicaid Care Coordination team services today. [name] agreed to services and verbal consent obtained.   Objective:  Lab Results  Component Value Date   CREATININE 0.89 02/04/2019   CREATININE 1.17 (H) 10/15/2018   CREATININE 0.85 03/05/2018    Lab Results  Component Value Date   HGBA1C 5.8 (A) 10/06/2020       Component Value Date/Time   CHOL 190 02/04/2019 1450   TRIG 161 (H) 02/04/2019 1450   HDL 41 02/04/2019 1450   CHOLHDL 4.6 (H) 02/04/2019 1450   CHOLHDL 5.2 12/06/2016 0749   VLDL 19 12/06/2016 0749   LDLCALC 120 (H) 02/04/2019 1450    Other: (TSH, CBC, Vit D, etc.)  Clinical ASCVD: Yes  The ASCVD Risk score [name] DC Jr., et al., 2013) failed to calculate for the following reasons:   The patient has a prior MI or stroke diagnosis    Other: (CHADS2VASc if Afib, PHQ9 if depression, MMRC or CAT for COPD, ACT, DEXA)  BP Readings from Last 3 Encounters:  10/06/20 (!) 166/79  07/06/20 (!) 186/90  05/20/20 140/72    Assessment/Interventions: Review of patient past medical history, allergies, medications, health status, including review of consultants reports, laboratory and other test data, was performed as part of comprehensive evaluation and provision of chronic care management services.   HTN -Doesn't have money for a cuff Amlodipine 10m Clonidine 0.251m-Extensive time spent on counseling patient on blood pressure goal  and impact of each antihypertensive medication on their blood pressure and risk reduction for CV disease.  Used analogies to explain the need for multiple antihypertensive medications to achieve BP goals and that it is often a silent disease with no symptoms.  Plan: At goal,  patient stable/ symptoms controlled   Edema Furosemide 2057mlan: At goal,  patient stable/ symptoms controlled   Itching -Hasn't taken in months Hydroxyzine 56m35mN Plan: At goal,  patient stable/ symptoms controlled   Pain (Left thumb/wrist AND Right Shoulder) 10/11/20: -Without Meds: 100/10 -With Meds: 4/10 -Sports Med, "Independent review of the right shoulder x-ray from 2021 shows no significant changes.  Independent review of the left shoulder x-ray from 2021 shows [facility] Pennsaid 2% (Samples from Sports Med) Gabapentin 300mg58m PRN Methocarbamol 500mg 61mPRN Plan: At goal,  patient stable/ symptoms controlled  Hx Occipital Stroke -Sept 2018 Atorvastatin 40mg A59m25mg -O108m15 minutes spent counseling patient on role of hyperlipidemia on heart disease and the impact of each lipid subclass with emphasis on LDL and heart disease risk.  Explained role of statins in prevention of CV disease complications and actual risk for adverse effects with statin treatment.  Counseled patient on genetic component placing them at risk for hyperlipidemia. Plan: At goal,  patient stable/ symptoms controlled   SDOH (Social Determinants of Health) assessments and interventions performed:    Care Plan  Allergies  Allergen Reactions   Amitriptyline Other (See Comments)    Patient reported that it  made her throat feel like its locking up and it also caused issues with her going to the bathroom   Duloxetine Other (See Comments)    Patient reported that it made her throat lock up and it caused her to have issue with going to the bathroom   Naproxen     Vomiting, sweating, abd spasms    Other     States can't take pain meds that end in "cet" or meds that end in "ine" Darvocet/severe vomiting   Darvon [Propoxyphene] Nausea And Vomiting   Hydrocodone Nausea And Vomiting   Lactose Intolerance (Gi)     Bloating, gas, abd pain   Latex Itching and Rash   Oxycodone Nausea And Vomiting   Percocet [Oxycodone-Acetaminophen] Nausea And Vomiting   Topamax [Topiramate]     Memory made her emotional     Medications Reviewed Today     Reviewed by [name], [facility] (Pharmacist) on 10/11/20 at 1332  Med List Status: <None>   Medication Order Taking? Sig Documenting Provider Last Dose Status Informant  albuterol (VENTOLIN HFA) 108 (90 Base) MCG/ACT inhaler [id number]  Inhale 2 puffs into the lungs every 4 (four) hours as needed for wheezing or shortness of breath. Vevelyn Francois, NP  Active   amLODipine (NORVASC) 10 MG tablet [id number] Yes Take 1 tablet (10 mg total) by mouth daily. Vevelyn Francois, NP Taking Active   aspirin 325 MG tablet [id number] Yes Take 1 tablet (325 mg total) by mouth daily. Cristal Ford, DO Taking Active Self  atorvastatin (LIPITOR) 40 MG tablet [id number] Yes Take 1 tablet (40 mg total) by mouth daily. Vevelyn Francois, NP Taking Active   Blood Pressure Monitor KIT [id number]  1 kit by Does not apply route daily. Vevelyn Francois, NP  Active   cloNIDine (CATAPRES) 0.2 MG tablet [id number] Yes Take by mouth. [provider] Taking Active   cyanocobalamin 1000 MCG tablet [id number] Yes Take 1 tablet (1,000 mcg total) by mouth daily. Azzie Glatter, FNP Taking Active   Diclofenac Sodium 2 % SOLN [id number] Yes Place 40 mg onto the skin 2 (two) times daily as needed (for knee pain). (40 mg = 2 pump actuations). Use as directed. Vevelyn Francois, NP Taking Active   dimenhyDRINATE (DRAMAMINE) 50 MG tablet [id number] Yes Take 50 mg by mouth every 8 (eight) hours as needed for dizziness. [provider] Taking Active            Med Note [name] Dome Aug 04, 2019  2:20 PM) As needed.   docusate sodium (COLACE) 100 MG capsule [id number] Yes Take 200 mg by mouth 2 (two) times daily. [provider] Taking Active Self  Ergocalciferol (VITAMIN D2) 10 MCG (400 UNIT) TABS [id number] Yes Take 10 mcg by mouth daily. Azzie Glatter, FNP Taking Active   furosemide (LASIX) 20 MG tablet [id number] Yes Take 1 tablet (20 mg total) by mouth 2 (two) times daily as needed. Vevelyn Francois, NP Taking Active   gabapentin (NEURONTIN) 300 MG capsule [id number] Yes Take 1 capsule (300 mg total) by mouth 3 (three) times daily. Vevelyn Francois, NP Taking Active   hydrOXYzine (VISTARIL) 50 MG capsule [id number] Yes Take 1-2 capsules (50-100 mg total) by mouth 3 (three) times daily as needed for itching. Vevelyn Francois, NP Taking Active   methocarbamol (ROBAXIN) 500 MG tablet [id number] Yes Take 1 tablet (500 mg total) by mouth 3 (three) times daily. Edison Pace, [employer]  M, NP Taking Active   Multiple Vitamin (MULTIVITAMIN) capsule [id number]  Take 4 capsules by mouth 2 (two) times daily. Centrum plus [provider]  Active Self  omeprazole (PRILOSEC) 20 MG capsule [id number] Yes Take 1 capsule (20 mg total) by mouth 2 (two) times daily before a meal. Azzie Glatter, FNP Taking Active   senna (SENOKOT) 8.6 MG TABS tablet [id number] Yes Take 2 tablets (17.2 mg total) by mouth 2 (two) times daily. Vevelyn Francois, NP Taking Active   triamcinolone cream (KENALOG) 0.1 % [id number] Yes Apply 1 application topically 2 (two) times daily. Scot Jun, FNP Taking Active            Med Note Azzie Glatter   Mon Aug 04, 2019  2:21 PM) As needed.             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   B12 deficiency 12/19/2016   Chest pain 12/18/2016   Cerebellar infarct ([place])    Diastolic dysfunction    Marijuana abuse    History of tobacco abuse    Benign essential HTN    Tachycardia    Hypokalemia    Stroke (cerebrum) ([place]) 12/05/2016   Nonintractable headache 12/05/2016   Occipital stroke ([place]) 12/05/2016   Hypertensive urgency 12/05/2016   Hypertension     Conditions to be addressed/monitored: HTN  Patient Care Plan: Adult Health Management     Problem Identified: Chronic Pain Management (Chronic Pain)      Long-Range Goal: Chronic Pain Managed   Start Date: 09/22/2020  Expected End Date: 12/27/2020  This Visit's Progress: On track  Priority: High  Note:   Current Barriers:  Knowledge Deficits related to self-health management of acute or chronic pain Chronic Disease Management support and education needs related to chronic pain-Ms. Klassen has an appointment with a new PCP. She would like to discuss several concerns with her new PCP(new pain in both feet, needing a shower chair and BP monitor). She does not take all of her medications due to financial strain. She uses her monthly income to pay rent, insurance, power and gas. She has very little money left over. Chronic Disease Management support and education needs related to chronic pain Financial Constraints.  Does not adhere to prescribed medication regimen-Patient is unable to afford all of her medications Clinical Goal(s):  patient will verbalize understanding of plan for pain management. , patient will attend all scheduled medical appointments: 10/06/20 with new PCP, patient will demonstrate use of different relaxation  skills and/or diversional activities to assist with pain reduction (distraction, imagery, relaxation, massage, acupressure, TENS, heat, and cold application., and patient will  use pharmacological and nonpharmacological pain relief strategies as prescribed.  Interventions:  Pain assessment performed Medications reviewed Discussed plans with patient for ongoing care management follow up and provided patient with direct contact information for care management team Evaluation of current treatment plan related to pain and patient's adherence to plan as established by provider. Advised patient to make a list of things that she would like to discuss at her next appointment with new PCP Provided education  to patient re: managing pain Reviewed medications with patient and discussed the importance of taking all medications as directed Referral to MM Pharmacist for medication management Referral to [place] for financial and food resources Patient Goals/Self Care Activities:  - make a list of things you want to discuss at new PCP appointment - develop a personal pain management plan - keep track of prescription refills - plan exercise or activity when pain is best controlled - start a pain diary - track what makes the pain worse and what makes it better - use ice or heat for pain relief - work slower and less intense when having pain  Follow Up Plan: Telephone follow up appointment with care management team member scheduled for:10/26/20 @ 1pm     Problem Identified: Hypertension (Hypertension)      Long-Range Goal: Hypertension Monitored   Start Date: 09/22/2020  Expected End Date: 12/27/2020  This Visit's Progress: On track  Priority: High  Note:   Objective:  Last practice recorded BP readings:  BP Readings from Last 3 Encounters:  07/06/20 (!) 186/90  05/20/20 140/72  04/27/20 (!) 142/77   Most recent eGFR/CrCl: No results found for: EGFR  No components found for: CRCL  Current Barriers:  Knowledge Deficits related to basic understanding of hypertension pathophysiology and self care management Difficulty obtaining medications Non-adherence to  prescribed medication regimen Financial Constraints.  Does not contact provider office for questions/concerns Case Manager Clinical Goal(s):  patient will verbalize understanding of plan for hypertension management patient will attend all scheduled medical appointments: 10/06/20 with new PCP patient will demonstrate improved adherence to prescribed treatment plan for hypertension as evidenced by taking all medications as prescribed, monitoring and recording blood pressure as directed, adhering to low sodium/DASH diet Interventions:  Provided education to patient re: stroke prevention, s/s of heart attack and stroke, DASH diet, complications of uncontrolled blood pressure Reviewed medications with patient and discussed importance of compliance Discussed plans with patient for ongoing care management follow up and provided patient with direct contact information for care management team Reviewed scheduled/upcoming provider appointments including: 10/06/20 with new PCP Referral to MM Pharmacist for medication management Referral to [place] for assistance with financial and food resources Patient Goals/Self-Care Activities:  - Self administers medications as prescribed - Attends all scheduled provider appointments - Calls provider office for new concerns, questions, or BP outside discussed parameters - Checks BP and records as discussed - Follows a low sodium diet/DASH diet  RNCM will follow up with a telephone visit on 10/26/20 @ 1pm      Patient Care Plan: Medication Management     Problem Identified: Health Promotion or Disease Self-Management (General Plan of Care)      Goal: Medication Management   Note:   Current Barriers:  Unable to independently afford treatment regimen   Pharmacist Clinical Goal(s):  Over the next 90 days, patient will verbalize ability to afford treatment regimen through collaboration with PharmD and provider    Interventions: Inter-disciplinary  care team collaboration (see longitudinal plan of care) Comprehensive medication review performed; medication list updated in electronic medical record    Patient Goals/Self-Care Activities Over the next 90 days, patient will:  - take medications as prescribed  Follow Up Plan: The patient has been provided with contact information for the care management team and has been advised to call with any health related questions or concerns.      Task: Mutually Develop and Royce Macadamia Achievement of Patient Goals  Note:   Care Management Activities:    - verbalization of feelings encouraged    Notes:      Medication Assistance:  Patient can't afford medications. Because she's Medicaid, there is potential that she could get meds at $0 co-pay if she found a Pharmacy that will waive co-pay. Will look into this further, provided patient with this idea and she will call pharmacies herself to see if she can find one locally  Follow up: Agree   Plan: The patient has been provided with contact information for the care management team and has been advised to call with any health related questions or concerns.   [place] Constable, Pharm.D., Managed Medicaid Pharmacist - [phone]

## 2020-10-11 NOTE — Patient Instructions (Signed)
Visit Information  Ms. Lucado was given information about Medicaid Managed Care team care coordination services as a part of their [place] Medicaid benefit. Martavia Tye verbally consented to engagement with the [facility] Managed Care team.   If you are experiencing a medical emergency, please call 911 or report to your local emergency department or urgent care.   If you have a non-emergency medical problem during routine business hours, please contact your provider's office and ask to speak with a nurse.   For questions related to your [facility], please call: [phone] or visit the homepage here: [email or web address]  If you would like to schedule transportation through your [facility], please call the following number at least 2 days in advance of your appointment: [phone].   Call the [place] at [phone], at any time, 24 hours a day, 7 days a week. If you are in danger or need immediate medical attention call 911.  If you would like help to quit smoking, call 1-800-QUIT-NOW 3612987197) OR Espaol: 1-855-Djelo-Ya (6-811-572-6203) o para ms informacin haga clic aqu or Text READY to 200-400 to register via text  Ms. Gruner - following are the goals we discussed in your visit today:   Goals Addressed             This Visit's Progress    Manage My Medicine       Timeframe:  Short-Term Goal Priority:  High Start Date:                             Expected End Date:                       Follow Up Date 01/10/21    - call for medicine refill 2 or 3 days before it runs out    Why is this important?   These steps will help you keep on track with your medicines.   Notes: Patient will call [facility] to see if they can deliver meds for $0        Please see education materials related to HTN provided as  print materials.   Patient verbalizes understanding of instructions provided today.   The Managed Medicaid care management team will reach out to the patient again over the next 90 days.   [place] Constable, Pharm.D., Managed Medicaid Pharmacist [phone]   Following is a copy of your plan of care:  Patient Care Plan: Adult Health Management     Problem Identified: Chronic Pain Management (Chronic Pain)      Long-Range Goal: Chronic Pain Managed   Start Date: 09/22/2020  Expected End Date: 12/27/2020  This Visit's Progress: On track  Priority: High  Note:   Current Barriers:  Knowledge Deficits related to self-health management of acute or chronic pain Chronic Disease Management support and education needs related to chronic pain-Ms. Eugene has an appointment with a new PCP. She would like to discuss several concerns with her new PCP(new pain in both feet, needing a shower chair and BP monitor). She does not take all of her medications due to financial strain. She uses her monthly income to pay rent, insurance, power and gas. She has very little money left over. Chronic Disease Management support and education needs related to chronic pain Financial Constraints.  Does not adhere to prescribed medication regimen-Patient is unable to afford all of  her medications Clinical Goal(s):  patient will verbalize understanding of plan for pain management. , patient will attend all scheduled medical appointments: 10/06/20 with new PCP, patient will demonstrate use of different relaxation  skills and/or diversional activities to assist with pain reduction (distraction, imagery, relaxation, massage, acupressure, TENS, heat, and cold application., and patient will use pharmacological and nonpharmacological pain relief strategies as prescribed.  Interventions:  Pain assessment performed Medications reviewed Discussed plans with patient for ongoing care management follow up and provided patient with  direct contact information for care management team Evaluation of current treatment plan related to pain and patient's adherence to plan as established by provider. Advised patient to make a list of things that she would like to discuss at her next appointment with new PCP Provided education to patient re: managing pain Reviewed medications with patient and discussed the importance of taking all medications as directed Referral to MM Pharmacist for medication management Referral to [place] for financial and food resources Patient Goals/Self Care Activities:  - make a list of things you want to discuss at new PCP appointment - develop a personal pain management plan - keep track of prescription refills - plan exercise or activity when pain is best controlled - start a pain diary - track what makes the pain worse and what makes it better - use ice or heat for pain relief - work slower and less intense when having pain  Follow Up Plan: Telephone follow up appointment with care management team member scheduled for:10/26/20 @ 1pm     Problem Identified: Hypertension (Hypertension)      Long-Range Goal: Hypertension Monitored   Start Date: 09/22/2020  Expected End Date: 12/27/2020  This Visit's Progress: On track  Priority: High  Note:   Objective:  Last practice recorded BP readings:  BP Readings from Last 3 Encounters:  07/06/20 (!) 186/90  05/20/20 140/72  04/27/20 (!) 142/77   Most recent eGFR/CrCl: No results found for: EGFR  No components found for: CRCL  Current Barriers:  Knowledge Deficits related to basic understanding of hypertension pathophysiology and self care management Difficulty obtaining medications Non-adherence to prescribed medication regimen Financial Constraints.  Does not contact provider office for questions/concerns Case Manager Clinical Goal(s):  patient will verbalize understanding of plan for hypertension management patient will attend all  scheduled medical appointments: 10/06/20 with new PCP patient will demonstrate improved adherence to prescribed treatment plan for hypertension as evidenced by taking all medications as prescribed, monitoring and recording blood pressure as directed, adhering to low sodium/DASH diet Interventions:  Provided education to patient re: stroke prevention, s/s of heart attack and stroke, DASH diet, complications of uncontrolled blood pressure Reviewed medications with patient and discussed importance of compliance Discussed plans with patient for ongoing care management follow up and provided patient with direct contact information for care management team Reviewed scheduled/upcoming provider appointments including: 10/06/20 with new PCP Referral to MM Pharmacist for medication management Referral to [place] for assistance with financial and food resources Patient Goals/Self-Care Activities:  - Self administers medications as prescribed - Attends all scheduled provider appointments - Calls provider office for new concerns, questions, or BP outside discussed parameters - Checks BP and records as discussed - Follows a low sodium diet/DASH diet  RNCM will follow up with a telephone visit on 10/26/20 @ 1pm      Patient Care Plan: Medication Management     Problem Identified: Health Promotion or Disease Self-Management (General Plan of Care)  Goal: Medication Management   Note:   Current Barriers:  Unable to independently afford treatment regimen   Pharmacist Clinical Goal(s):  Over the next 90 days, patient will verbalize ability to afford treatment regimen through collaboration with PharmD and provider    Interventions: Inter-disciplinary care team collaboration (see longitudinal plan of care) Comprehensive medication review performed; medication list updated in electronic medical record    Patient Goals/Self-Care Activities Over the next 90 days, patient will:  - take  medications as prescribed  Follow Up Plan: The patient has been provided with contact information for the care management team and has been advised to call with any health related questions or concerns.      Task: Mutually Develop and Royce Macadamia Achievement of Patient Goals   Note:   Care Management Activities:    - verbalization of feelings encouraged    Notes:

## 2020-10-14 ENCOUNTER — Ambulatory Visit (INDEPENDENT_AMBULATORY_CARE_PROVIDER_SITE_OTHER): Payer: Medicaid Other | Admitting: Podiatry

## 2020-10-14 ENCOUNTER — Ambulatory Visit (INDEPENDENT_AMBULATORY_CARE_PROVIDER_SITE_OTHER): Payer: Medicaid Other

## 2020-10-14 ENCOUNTER — Other Ambulatory Visit: Payer: Self-pay

## 2020-10-14 DIAGNOSIS — M79674 Pain in right toe(s): Secondary | ICD-10-CM

## 2020-10-14 DIAGNOSIS — M722 Plantar fascial fibromatosis: Secondary | ICD-10-CM

## 2020-10-14 DIAGNOSIS — B351 Tinea unguium: Secondary | ICD-10-CM | POA: Diagnosis not present

## 2020-10-14 MED ORDER — CICLOPIROX 8 % EX SOLN: Freq: Every day | CUTANEOUS | 0 refills | Status: DC

## 2020-10-15 ENCOUNTER — Telehealth: Payer: Self-pay | Admitting: Podiatry

## 2020-10-15 NOTE — Telephone Encounter (Signed)
Called patient and the patient is on the way over here to pick up the night splint. Lattie Haw

## 2020-10-15 NOTE — Telephone Encounter (Signed)
Patient calling to inform Dr. Jacqualyn Posey that she had a good nights rest with the boot and would like one for the other foot. She is willing to come pick it up. Will this be ok ?

## 2020-10-18 ENCOUNTER — Telehealth: Payer: Self-pay | Admitting: Podiatry

## 2020-10-18 ENCOUNTER — Other Ambulatory Visit: Payer: Self-pay | Admitting: Podiatry

## 2020-10-18 MED ORDER — CICLOPIROX 8 % EX SOLN: Freq: Every day | CUTANEOUS | 0 refills | Status: DC

## 2020-10-18 NOTE — Telephone Encounter (Signed)
Patient needs medication sent to the correct pharmacy. It has been changed to [facility]

## 2020-10-18 NOTE — Progress Notes (Signed)
Subjective:   Patient ID: Brittany [name], female   DOB: 55 y.o.   MRN: [id number]   HPI 55 year old female presents the office today for concerns of bilateral heel pain.  She does have a history of stroke and since the stroke she has been having swelling and pain to her heels.  She also describes swelling and pain to her left knee.  She states that she previously been doing physical therapy which is helpful and she feels that she may need to go back to this.  She denies any recent injury or trauma.  Also considering her nails becoming thickened discolored as well as calluses on side of her big toes.  No ulcerations that she reports.  No recent treatment for these issues.  No other concerns.   Review of Systems  All other systems reviewed and are negative.    Past Medical History:  Diagnosis Date   Anemia    Arthritis    trigger finger in left hand   Complication of anesthesia    per pt, hard to wake up!   Elevated cholesterol    Female bladder prolapse    per urologist, does not have prolaspe   GERD (gastroesophageal reflux disease)    Heart murmur    pt unsure.   Hiatal hernia    Hypertension    IBS (irritable bowel syndrome)    Multiple myeloma ([place]) 2005   had partial chemo   Peripheral neuropathy    SOB (shortness of breath) on exertion    uses an inhaler   Stroke [facility]) 2017   paralysis left arm/uses a walker    Past Surgical History:  Procedure Laterality Date   BONE BIOPSY  2005   in her back   COLONOSCOPY     over 10 years x3   ESOPHAGOGASTRODUODENOSCOPY     incomplete-over 10 years ago    TEE WITHOUT CARDIOVERSION N/A 12/08/2016   Procedure: TRANSESOPHAGEAL ECHOCARDIOGRAM (TEE);  Surgeon: Sueanne Margarita, MD;  Location: [facility] ENDOSCOPY;  Service: Cardiovascular;  Laterality: N/A;   TUBAL LIGATION     UPPER GASTROINTESTINAL ENDOSCOPY     WISDOM TOOTH EXTRACTION       Current Outpatient Medications:    albuterol (VENTOLIN HFA) 108 (90 Base) MCG/ACT inhaler,  Inhale 2 puffs into the lungs every 4 (four) hours as needed for wheezing or shortness of breath., Disp: 8.5 g, Rfl: 11   amLODipine (NORVASC) 10 MG tablet, Take 1 tablet (10 mg total) by mouth daily., Disp: 30 tablet, Rfl: 3   aspirin 325 MG tablet, Take 1 tablet (325 mg total) by mouth daily., Disp: 30 tablet, Rfl: 0   atorvastatin (LIPITOR) 40 MG tablet, Take 1 tablet (40 mg total) by mouth daily., Disp: 90 tablet, Rfl: 3   Blood Pressure Monitor KIT, 1 kit by Does not apply route daily., Disp: 1 kit, Rfl: 0   ciclopirox (PENLAC) 8 % solution, Apply topically at bedtime. Apply over nail and surrounding skin. Apply daily over previous coat. After seven (7) days, may remove with alcohol and continue cycle., Disp: 6.6 mL, Rfl: 0   cloNIDine (CATAPRES) 0.2 MG tablet, Take by mouth., Disp: , Rfl:    cyanocobalamin 1000 MCG tablet, Take 1 tablet (1,000 mcg total) by mouth daily., Disp: 30 tablet, Rfl: 4   Diclofenac Sodium 2 % SOLN, Place 40 mg onto the skin 2 (two) times daily as needed (for knee pain). (40 mg = 2 pump actuations). Use as directed., Disp: 112 g, Rfl:  0   dimenhyDRINATE (DRAMAMINE) 50 MG tablet, Take 50 mg by mouth every 8 (eight) hours as needed for dizziness., Disp: , Rfl:    docusate sodium (COLACE) 100 MG capsule, Take 200 mg by mouth 2 (two) times daily., Disp: , Rfl:    Ergocalciferol (VITAMIN D2) 10 MCG (400 UNIT) TABS, Take 10 mcg by mouth daily., Disp: 30 tablet, Rfl: 6   furosemide (LASIX) 20 MG tablet, Take 1 tablet (20 mg total) by mouth 2 (two) times daily as needed., Disp: 60 tablet, Rfl: 3   gabapentin (NEURONTIN) 300 MG capsule, Take 1 capsule (300 mg total) by mouth 3 (three) times daily., Disp: 90 capsule, Rfl: 3   hydrOXYzine (VISTARIL) 50 MG capsule, Take 1-2 capsules (50-100 mg total) by mouth 3 (three) times daily as needed for itching., Disp: 90 capsule, Rfl: 3   methocarbamol (ROBAXIN) 500 MG tablet, Take 1 tablet (500 mg total) by mouth 3 (three) times daily.,  Disp: 90 tablet, Rfl: 3   Multiple Vitamin (MULTIVITAMIN) capsule, Take 4 capsules by mouth 2 (two) times daily. Centrum plus, Disp: , Rfl:    omeprazole (PRILOSEC) 20 MG capsule, Take 1 capsule (20 mg total) by mouth 2 (two) times daily before a meal., Disp: 60 capsule, Rfl: 3   senna (SENOKOT) 8.6 MG TABS tablet, Take 2 tablets (17.2 mg total) by mouth 2 (two) times daily., Disp: 360 tablet, Rfl: 3   triamcinolone cream (KENALOG) 0.1 %, Apply 1 application topically 2 (two) times daily., Disp: 454 g, Rfl: 0  Allergies  Allergen Reactions   Amitriptyline Other (See Comments)    Patient reported that it made her throat feel like its locking up and it also caused issues with her going to the bathroom   Duloxetine Other (See Comments)    Patient reported that it made her throat lock up and it caused her to have issue with going to the bathroom   Naproxen     Vomiting, sweating, abd spasms   Other     States can't take pain meds that end in "cet" or meds that end in "ine" Darvocet/severe vomiting   Darvon [Propoxyphene] Nausea And Vomiting   Hydrocodone Nausea And Vomiting   Lactose Intolerance (Gi)     Bloating, gas, abd pain   Latex Itching and Rash   Oxycodone Nausea And Vomiting   Percocet [Oxycodone-Acetaminophen] Nausea And Vomiting   Topamax [Topiramate]     Memory made her emotional           Objective:  Physical Exam  General: AAO x3, NAD  Dermatological: Nails are very hypertrophic and dystrophic with brown discoloration.  Nails are tender x10.  No edema or signs of infection of the toenail sites.  Hyperkeratotic lesions medial hallux.  No underlying ulceration drainage or signs of infection.  No open lesions today.  Vascular: Dorsalis Pedis artery and Posterior Tibial artery pedal pulses are 2/4 bilateral with immedate capillary fill time. There is no pain with calf compression, swelling, warmth, erythema.   Neruologic: Grossly intact via light touch bilateral.    Musculoskeletal: There is tenderness palpation on the plantar medial tubercle of the calcaneus at the insertion of plantar fascial bilaterally.  There is no pain with lateral compression of calcaneus.  No pain to the posterior calcaneus Achilles tendon.  Flexor, extensor tendons appear to be intact.  Decreased dorsiflexion of the ankles bilaterally.  Plantar flexion intact.  Gait: Uses a walker and AFOs      Assessment:  Bilateral heel pain, Planter fasciitis; onychomycosis, callus     Plan:  -Treatment options discussed including all alternatives, risks, and complications -Etiology of symptoms were discussed -X-rays were obtained and reviewed with the patient.  No evidence of acute fracture or stress fracture identified today. -Regards to the heel pain we discussed stretching, icing daily.  Will refer to physical therapy at [facility].  I dispensed a night splint.  She came to the office the next day stating that with the best night sleep she had a long time and requested another which was dispensed. -Debride the nails x10 without any complications or bleeding.  Prescribed Penlac.  Also there is a debride the hyperkeratotic lesions leading complications of bleeding.  Moisturizer daily.  Trula Slade DPM

## 2020-10-19 ENCOUNTER — Other Ambulatory Visit: Payer: Self-pay

## 2020-10-22 DIAGNOSIS — G894 Chronic pain syndrome: Secondary | ICD-10-CM | POA: Diagnosis not present

## 2020-10-26 ENCOUNTER — Other Ambulatory Visit: Payer: Self-pay | Admitting: *Deleted

## 2020-10-26 NOTE — Patient Instructions (Signed)
Visit Information  [name]  - as a part of your Medicaid benefit, you are eligible for care management and care coordination services at no cost or copay. I was unable to reach you by phone today but would be happy to help you with your health related needs. Please feel free to call me @ [phone].   A member of the Managed Medicaid care management team will reach out to you again over the next 21 days.   Lurena Joiner RN, BSN [place]  [employer] manager

## 2020-10-26 NOTE — Patient Outreach (Signed)
Care Coordination  10/26/2020  Brittany [name] 1965-10-11 [id number]   Medicaid Managed Care   Unsuccessful Outreach Note  10/26/2020 Name: Brittany [name] MRN: [id number] DOB: February 05, 1966  Referred by: Vevelyn Francois, NP Reason for referral : High Risk Managed Medicaid (Unsuccessful RNCM telephone outreach)   An unsuccessful telephone outreach was attempted today. The patient was referred to the case management team for assistance with care management and care coordination.   Follow Up Plan: A HIPAA compliant phone message was left for the patient providing contact information and requesting a return call.   Lurena Joiner RN, BSN [place]  [employer] manager

## 2020-11-04 ENCOUNTER — Ambulatory Visit: Payer: Medicaid Other | Admitting: Physical Therapy

## 2020-11-17 ENCOUNTER — Other Ambulatory Visit: Payer: Self-pay | Admitting: *Deleted

## 2020-11-17 NOTE — Patient Instructions (Signed)
Visit Information  Ms. Jacyn Gentry  - as a part of your Medicaid benefit, you are eligible for care management and care coordination services at no cost or copay. I was unable to reach you by phone today but would be happy to help you with your health related needs. Please feel free to call me @ [phone].   A member of the Managed Medicaid care management team will reach out to you again over the next 21 days.   Lurena Joiner RN, BSN [place]  [employer] manager

## 2020-11-17 NOTE — Patient Outreach (Signed)
Care Coordination  11/17/2020  Brittany [name] Apr 30, 1965 [id number]   Medicaid Managed Care   Unsuccessful Outreach Note  11/17/2020 Name: Brittany [name] MRN: [id number] DOB: October 29, 1965  Referred by: Vevelyn Francois, NP Reason for referral : High Risk Managed Medicaid (Unsuccessful RNCM follow up telephone outreach)   A second unsuccessful telephone outreach was attempted today. The patient was referred to the case management team for assistance with care management and care coordination.   Follow Up Plan: A HIPAA compliant phone message was left for the patient providing contact information and requesting a return call.   Lurena Joiner RN, BSN [place]  [employer] manager

## 2020-11-23 ENCOUNTER — Telehealth: Payer: Self-pay

## 2020-11-23 NOTE — Telephone Encounter (Signed)
Change her inhaler

## 2020-11-24 NOTE — Telephone Encounter (Signed)
Spoke to the patient and she stated that she has been be having a bad reaction to the albuterol inhaler x 1 month ago. Pt states her symptoms are sore throat, hoarseness,tightness in her chest with pain. Pt states that her symptoms went on for a week before she realized that the albuterol was the cause of her reactions. Pt symptoms have subsided as of today due to her discontinuing the medication. Pt would like an alternative inhaler.

## 2020-12-07 ENCOUNTER — Encounter: Payer: Self-pay | Admitting: Physical Therapy

## 2020-12-07 ENCOUNTER — Other Ambulatory Visit: Payer: Self-pay | Admitting: *Deleted

## 2020-12-07 ENCOUNTER — Other Ambulatory Visit: Payer: Self-pay

## 2020-12-07 ENCOUNTER — Ambulatory Visit: Payer: Medicaid Other | Attending: Podiatry | Admitting: Physical Therapy

## 2020-12-07 DIAGNOSIS — M25572 Pain in left ankle and joints of left foot: Secondary | ICD-10-CM | POA: Diagnosis not present

## 2020-12-07 DIAGNOSIS — R2689 Other abnormalities of gait and mobility: Secondary | ICD-10-CM | POA: Insufficient documentation

## 2020-12-07 DIAGNOSIS — M25571 Pain in right ankle and joints of right foot: Secondary | ICD-10-CM | POA: Insufficient documentation

## 2020-12-07 DIAGNOSIS — M6281 Muscle weakness (generalized): Secondary | ICD-10-CM | POA: Insufficient documentation

## 2020-12-07 NOTE — Progress Notes (Unsigned)
Pt came to get samples

## 2020-12-07 NOTE — Addendum Note (Signed)
Addended by: Rennie Natter on: 12/07/2020 02:41 PM   Modules accepted: Orders

## 2020-12-07 NOTE — Patient Instructions (Signed)
Access Code: [email or web address] URL: [email or web address] Date: 12/07/2020 Prepared by: Glenetta Hew  Exercises Gastroc Stretch on Wall - 2 x daily - 7 x weekly - 1 sets - 3 reps - 30 hold Soleus Stretch on Wall - 2 x daily - 7 x weekly - 1 sets - 3 reps - 30 hold Seated Ankle Alphabet - 1 x daily - 7 x weekly - 1 sets - 1 reps Seated Plantar Fascia Mobilization with Small Ball (Mirrored) - 2 x daily - 7 x weekly - 20 reps  Patient Education Plantar Fasciitis

## 2020-12-07 NOTE — Progress Notes (Unsigned)
Medication Samples have been provided to the patient.  Drug name: *** pennsaid      Strength: *** 2%       Qty: ***2 boxes  LOT: ***[id number]  Exp.Date: ***10/23  Dosing instructions: ***use a pea size amount to the affected area  The patient has been instructed regarding the correct time, dose, and frequency of taking this medication, including desired effects and most common side effects.   April Manson 2:33 PM 12/07/2020

## 2020-12-07 NOTE — Therapy (Signed)
[facility] [address]  [place], [place], [address] Phone: [phone]   Fax:  [phone]  Physical Therapy Evaluation  Patient Details  Name: Brittany [name] MRN: [id number] Date of Birth: 11-Mar-1966 Referring Provider (PT): Celesta Gentile DPM   Encounter Date: 12/07/2020   PT End of Session - 12/07/20 1423     Visit Number 1    Number of Visits 12    Date for PT Re-Evaluation 01/18/21    Authorization Type U[employer] Medicaid    Progress Note Due on Visit 10    PT Start Time 1325    PT Stop Time 1410    PT Time Calculation (min) 45 min    Activity Tolerance Patient tolerated treatment well    Behavior During Therapy [facility] for tasks assessed/performed             Past Medical History:  Diagnosis Date   Anemia    Arthritis    trigger finger in left hand   Complication of anesthesia    per pt, hard to wake up!   Elevated cholesterol    Female bladder prolapse    per urologist, does not have prolaspe   GERD (gastroesophageal reflux disease)    Heart murmur    pt unsure.   Hiatal hernia    Hypertension    IBS (irritable bowel syndrome)    Multiple myeloma ([place]) 2005   had partial chemo   Peripheral neuropathy    SOB (shortness of breath) on exertion    uses an inhaler   Stroke [facility]) 2017   paralysis left arm/uses a walker    Past Surgical History:  Procedure Laterality Date   BONE BIOPSY  2005   in her back   COLONOSCOPY     over 10 years x3   ESOPHAGOGASTRODUODENOSCOPY     incomplete-over 10 years ago    TEE WITHOUT CARDIOVERSION N/A 12/08/2016   Procedure: TRANSESOPHAGEAL ECHOCARDIOGRAM (TEE);  Surgeon: Sueanne Margarita, MD;  Location: [facility] ENDOSCOPY;  Service: Cardiovascular;  Laterality: N/A;   TUBAL LIGATION     UPPER GASTROINTESTINAL ENDOSCOPY     WISDOM TOOTH EXTRACTION      There were no vitals filed for this visit.    Subjective Assessment - 12/07/20 1328     Subjective Patient  reported she started to have some heel pain, went to foot doctor who told her she has bone spurs in heel and gave her night splints to use, which has helped.  She also has AFOs issued by [employer] following CVA, and these are causing a lot of pain when walking, rubbing on callouses on big toe.  Also gets some pain/swelling in L knee and weakness.    Pertinent History CVA    Limitations Walking;Standing    How long can you sit comfortably? 30 minutes then needs to move due to L knee pain    How long can you stand comfortably? 30 minutes    How long can you walk comfortably? ~ 10 minutes, limited by SOB and LLE pain/weakness    Diagnostic tests X-rays of heels -  No evidence of acute fracture or stress fracture identified.    Patient Stated Goals "leg strength and walk better"    Currently in Pain? Yes    Pain Score 8    with prolonged walking   Pain Location Heel    Pain Orientation Right;Left    Pain Descriptors / Indicators Sharp;Sore    Pain  Type Chronic pain   since the stroke   Pain Onset Other (comment)   4 years ago   Pain Frequency Intermittent    Aggravating Factors  walking    Pain Relieving Factors sitting down    Effect of Pain on Daily Activities can't shop without taking breaks, sometimes has to go home and return later.                [facility] PT Assessment - 12/07/20 0001       Assessment   Medical Diagnosis M72.2 (ICD-10-CM) - Bilateral plantar fasciitis    Referring Provider (PT) Celesta Gentile DPM    Onset Date/Surgical Date --   chronic ~ 4 years   Next MD Visit 01/17/2021    Prior Therapy none for feet      Precautions   Precautions Fall    Precaution Comments history of CVA, balance deficits, uses 4WRW      Restrictions   Weight Bearing Restrictions No      Balance Screen   Has the patient fallen in the past 6 months No    Has the patient had a decrease in activity level because of a fear of falling?  No    Is the patient reluctant to leave their home  because of a fear of falling?  No      Home Environment   Living Environment Private residence    Living Arrangements Alone    Type of [place] Access Level entry    Home Layout One level    [place] - 4 wheels      Prior Function   Level of [place] device for independence    Vocation On disability    Leisure paint, garden      Cognition   Overall Cognitive Status Impaired/Different from baseline      Observation/Other Assessments   Focus on Therapeutic Outcomes (FOTO)  foot 43; predicted outcome 62 after 14 visits      Sensation   Light Touch Appears Intact   can detect light touch (pen click) all areas plantar feet bil     Tone   Assessment Location Right Lower Extremity;Left Lower Extremity      ROM / Strength   AROM / PROM / Strength AROM;PROM;Strength      AROM   Overall AROM  Deficits    AROM Assessment Site Ankle    Right/Left Ankle Right;Left    Right Ankle Dorsiflexion 0    Right Ankle Plantar Flexion 50    Right Ankle Inversion 40    Right Ankle Eversion 25    Left Ankle Dorsiflexion 0    Left Ankle Plantar Flexion 70    Left Ankle Inversion 40    Left Ankle Eversion 30      Strength   Overall Strength Deficits    Strength Assessment Site Hip;Knee;Ankle    Right/Left Hip Right;Left    Right Hip Flexion 4+/5    Right Hip ABduction 4+/5    Right Hip ADduction 4/5    Left Hip Flexion 4/5    Left Hip ABduction 4+/5    Left Hip ADduction 4/5    Right/Left Knee Right;Left    Right Knee Flexion 4+/5    Right Knee Extension 4+/5    Left Knee Flexion 4+/5   slow, guarded   Left Knee Extension 4+/5   slow, guarded   Right/Left Ankle Right;Left    Right Ankle Dorsiflexion 4+/5  Right Ankle Plantar Flexion 4+/5    Left Ankle Dorsiflexion 4+/5    Left Ankle Plantar Flexion 4+/5      Flexibility   Soft Tissue Assessment /Muscle Length yes    Hamstrings --   tightness bil gastroc and soleus, no changed in  ankle DF with bent knee or extended knee     Palpation   Palpation comment tenderness bil calcaneal tuberosity plantar surface,  plantar fascia, L lateral calcaneal tuberosity      Ambulation/Gait   Ambulation/Gait Yes    Ambulation/Gait Assistance 6: Modified independent (Device/Increase time)    Assistive device 4-wheeled walker    Gait Pattern Step-through pattern;Wide base of support;Ataxic;Right foot flat;Left foot flat      RLE Tone   RLE Tone Within Functional Limits      LLE Tone   LLE Tone Within Functional Limits                        Objective measurements completed on examination: See above findings.       [place] Adult PT Treatment/Exercise - 12/07/20 0001       Ankle Exercises: Stretches   Soleus Stretch 3 reps;30 seconds    Soleus Stretch Limitations demo, HEP    Gastroc Stretch 3 reps;30 seconds    Gastroc Stretch Limitations demo, HEP      Ankle Exercises: Seated   ABC's 1 rep    ABC's Limitations demo, HEP                     PT Education - 12/07/20 1413     Education Details educated on findings, POC, initial HEP.  Access Code: [email or web address]    Person(s) Educated Patient    Methods Explanation;Demonstration;Handout;Verbal cues    Comprehension Verbalized understanding;Returned demonstration;Need further instruction              PT Short Term Goals - 12/07/20 1431       PT SHORT TERM GOAL #1   Title Patient to be independent with initial HEP.    Baseline initial HEP given    Time 2    Period Weeks    Status New    Target Date 12/21/20               PT Long Term Goals - 12/07/20 1432       PT LONG TERM GOAL #1   Title Patient to be independent with advanced HEP.    Time 6    Period Weeks    Status New    Target Date 01/18/21      PT LONG TERM GOAL #2   Title Pt. will demonstrate 4+/5 bil LE strength throughout for safety with gait.    Baseline 4/5 hip strength    Time 6    Period Weeks    Status  New    Target Date 01/18/21      PT LONG TERM GOAL #3   Title Pt. will demonstrate at least 10 deg bil ankle DF for improve gait mechanics.    Baseline 0 deg ankle DF bil, gastroc and soleus tightness.    Time 6    Period Weeks    Status New    Target Date 01/18/21      PT LONG TERM GOAL #4   Title Patient to report tolerance for 30 minutes on her feet without pain limiting.    Baseline increased bil heel pain with standing walking +10  min    Time 6    Period Weeks    Status New    Target Date 01/18/21      PT LONG TERM GOAL #5   Title Pt. will be assessed for fall risk with appropriate goal TBD    Time 6    Period Weeks    Status New    Target Date 01/18/21                    Plan - 12/07/20 1424     Clinical Impression Statement Brittany [name] is a 55 year old female referred for bil. plantar fasciitis.  This started about 4 years ago following her CVA.  She has AFOs but these cause increased foot pain so not wearing today.  Has also been issued night splints which have helped.  Examination results consistent with MD findings.  She demonstrates pain/tightness bil heels and plantar fascia, tightness in both gastroc and soleus limiting ankle DF bil, and decreased tolerance to standing/walking.  She was instructed in initial HEP today for stretches and plantar fascia self mobs, and instructed to bring AFOs next session so can evaluate.  She would benefit from skilled physical therapy to decrease pain and improve gait and balance.    Personal Factors and Comorbidities Comorbidity 3+    Comorbidities history of CVA, balance deficits, L knee pain, HTN    Examination-Activity Limitations Locomotion Level;Stairs;Stand    Examination-Participation Restrictions Community Activity;Shop    Stability/Clinical Decision Making Evolving/Moderate complexity    Clinical Decision Making Moderate    Rehab Potential Good    PT Frequency 2x / week    PT Duration 6 weeks    PT  Treatment/Interventions ADLs/Self Care Home Management;Cryotherapy;Moist Heat;Stair training;Therapeutic activities;Gait training;Therapeutic exercise;Balance training;Neuromuscular re-education;Orthotic Fit/Training;Patient/family education;Manual techniques;Passive range of motion;Dry needling;Taping;Joint Manipulations    PT Next Visit Plan review HEP for stretches, assess balance, ankle/LE strengthening.    PT Home Exercise Plan Access Code: [email or web address]    [email or web address] and Agree with Plan of Care Patient             Patient will benefit from skilled therapeutic intervention in order to improve the following deficits and impairments:  Abnormal gait, Decreased activity tolerance, Decreased endurance, Decreased range of motion, Decreased strength, Increased fascial restricitons, Decreased balance, Decreased mobility, Difficulty walking, Increased muscle spasms, Impaired flexibility, Pain  Visit Diagnosis: Pain in left ankle and joints of left foot  Pain in right ankle and joints of right foot  Other abnormalities of gait and mobility  Muscle weakness (generalized)     Problem List Patient Active Problem List   Diagnosis Date Noted   Ganglion cyst of volar aspect of left wrist 05/20/2020   Trigger thumb of left hand 05/20/2020   Labral tear of shoulder, degenerative, right 05/20/2020   Anxiety 02/05/2019   Oral thrush 10/08/2018   Bilateral leg pain 10/08/2018   Constipation 10/08/2018   Shortness of breath 10/08/2018   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   B12 deficiency 12/19/2016   Chest pain 12/18/2016   Cerebellar infarct ([place])    Diastolic dysfunction    Marijuana abuse    History of tobacco abuse    Benign essential HTN    Tachycardia    Hypokalemia    Stroke (cerebrum) ([place]) 12/05/2016   Nonintractable headache 12/05/2016   Occipital stroke ([place]) 12/05/2016   Hypertensive urgency 12/05/2016   Hypertension     Rennie Natter, PT,  DPT 12/07/2020, 2:36  PM  [facility] [address]  [place], [place], [address] Phone: [phone]   Fax:  [phone]  Name: Brittany [name] MRN: [id number] Date of Birth: 1966/02/05

## 2020-12-08 ENCOUNTER — Other Ambulatory Visit: Payer: Self-pay | Admitting: *Deleted

## 2020-12-08 NOTE — Patient Instructions (Signed)
Visit Information  [name] was given information about Medicaid Managed Care team care coordination services as a part of their [place] Medicaid benefit. Brittany [name] verbally consented to engagement with the [facility] Managed Care team.   If you are experiencing a medical emergency, please call 911 or report to your local emergency department or urgent care.   If you have a non-emergency medical problem during routine business hours, please contact your provider's office and ask to speak with a nurse.   For questions related to your [facility], please call: [phone] or visit the homepage here: [email or web address]  If you would like to schedule transportation through your [facility], please call the following number at least 2 days in advance of your appointment: [phone].   Call the [place] at [phone], at any time, 24 hours a day, 7 days a week. If you are in danger or need immediate medical attention call 911.  If you would like help to quit smoking, call 1-800-QUIT-NOW 814-662-9592) OR Espaol: 1-855-Djelo-Ya (5-188-416-6063) o para ms informacin haga clic aqu or Text READY to 200-400 to register via text  [name] - following are the goals we discussed in your visit today:   Goals Addressed             This Visit's Progress    Manage Chronic Pain       Timeframe:  Long-Range Goal Priority:  High Start Date:   09/22/20                          Expected End Date:  02/08/21                       Follow Up Date 02/08/2021    - make a list of things you want to discuss at new PCP appointment - develop a personal pain management plan - keep track of prescription refills - plan exercise or activity when pain is best controlled - start a pain diary - track what makes the pain worse and what makes it  better - use ice or heat for pain relief - work slower and less intense when having pain    Why is this important?   Day-to-day life can be hard when you have chronic pain.  Pain medicine is just one piece of the treatment puzzle.  You can try these action steps to help you manage your pain.         Track and Manage My Blood Pressure-Hypertension       Timeframe:  Long-Range Goal Priority:  High Start Date: 09/22/20                            Expected End Date:  02/08/21                     Follow Up Date 02/08/21   - Self administers medications as prescribed - Attends all scheduled provider appointments - Calls provider office for new concerns, questions, or BP outside discussed parameters - Checks BP and records as discussed - Follows a low sodium diet/DASH diet - increase exercise to 30 min/day for 5 days a week     Why is this important?   You won't feel high blood pressure, but it can still hurt your blood vessels.  High blood pressure  can cause heart or kidney problems. It can also cause a stroke.  Making lifestyle changes like losing a little weight or eating less salt will help.  Checking your blood pressure at home and at different times of the day can help to control blood pressure.  If the doctor prescribes medicine remember to take it the way the doctor ordered.  Call the office if you cannot afford the medicine or if there are questions about it.             Please see education materials related to HTN provided as print materials.   The patient verbalized understanding of instructions provided today and agreed to receive a mailed copy of patient instruction and/or educational materials.  Telephone follow up appointment with Managed Medicaid care management team member scheduled for:02/08/21 @ 3:45pm  Lurena Joiner RN, BSN [place] RN Care Coordinator   Following is a copy of your plan of care:  Patient Care Plan: Adult  Health Management     Problem Identified: Chronic Pain Management (Chronic Pain)      Long-Range Goal: Chronic Pain Managed   Start Date: 09/22/2020  Expected End Date: 02/08/2021  Recent Progress: On track  Priority: High  Note:   Current Barriers:  Knowledge Deficits related to self-health management of acute or chronic pain Chronic Disease Management support and education needs related to chronic pain-[name] has an appointment with a new PCP. She would like to discuss several concerns with her new PCP(new pain in both feet, needing a shower chair and BP monitor). She does not take all of her medications due to financial strain. She uses her monthly income to pay rent, insurance, power and gas. She has very little money left over.-Update-[name] is seeing a podiatrist and starting PT for her feet pain(plantar fasciitis). She is getting her medications delivered and taking as directed. She has received resources and very thankful for the assistance. She is having pain and numbness in her arms at night while trying to sleep. Chronic Disease Management support and education needs related to chronic pain Financial Constraints.  Does not adhere to prescribed medication regimen-Patient is unable to afford all of her medications Clinical Goal(s):  patient will verbalize understanding of plan for pain management. , patient will attend all scheduled medical appointments: 10/06/20 with new PCP, patient will demonstrate use of different relaxation  skills and/or diversional activities to assist with pain reduction (distraction, imagery, relaxation, massage, acupressure, TENS, heat, and cold application., and patient will use pharmacological and nonpharmacological pain relief strategies as prescribed.  Interventions:  Discussed plans with patient for ongoing care management follow up and provided patient with direct contact information for care management team Evaluation of current treatment plan  related to pain and patient's adherence to plan as established by provider. Advised patient to make a list of things that she would like to discuss at her next appointment with PCP Provided education to patient re: managing pain Reviewed medications with patient and discussed the importance of taking all medications as directed Patient Goals/Self Care Activities:  - make a list of things you want to discuss at new PCP appointment - develop a personal pain management plan - keep track of prescription refills - plan exercise or activity when pain is best controlled - start a pain diary - track what makes the pain worse and what makes it better - use ice or heat for pain relief - work slower and less intense when  having pain  Follow Up Plan: Telephone follow up appointment with care management team member scheduled for:02/08/21 @ 3:45pm     Problem Identified: Hypertension (Hypertension)      Long-Range Goal: Hypertension Monitored   Start Date: 09/22/2020  Expected End Date: 02/08/2021  Recent Progress: On track  Priority: High  Note:   Objective:  Last practice recorded BP readings:  BP Readings from Last 3 Encounters:  10/06/20 (!) 166/79  07/06/20 (!) 186/90  05/20/20 140/72   Most recent eGFR/CrCl: No results found for: EGFR  No components found for: CRCL  Current Barriers:  Knowledge Deficits related to basic understanding of hypertension pathophysiology and self care management-[name] is checking her BP daily, reading today 149/93. She cooks most of her food and likes to eat fruits and vegetables. She has all of her medications and is taking as prescribed. Difficulty obtaining medications Non-adherence to prescribed medication regimen Financial Constraints.  Does not contact provider office for questions/concerns Case Manager Clinical Goal(s):  patient will verbalize understanding of plan for hypertension management patient will attend all scheduled medical  appointments: 10/06/20 with new PCP patient will demonstrate improved adherence to prescribed treatment plan for hypertension as evidenced by taking all medications as prescribed, monitoring and recording blood pressure as directed, adhering to low sodium/DASH diet Interventions:  Provided education to patient re: stroke prevention, s/s of heart attack and stroke, DASH diet, complications of uncontrolled blood pressure Reviewed medications with patient and discussed importance of compliance Discussed plans with patient for ongoing care management follow up and provided patient with direct contact information for care management team Reviewed scheduled/upcoming provider appointments including: Physical Therapy, PCP and TFC  Patient Goals/Self-Care Activities:  - Self administers medications as prescribed - Attends all scheduled provider appointments - Calls provider office for new concerns, questions, or BP outside discussed parameters - Checks BP and records as discussed - Follows a low sodium diet/DASH diet - increase exercise to 30 min/day for 5 days a week  RNCM will follow up with a telephone visit on 02/08/21 @ 3:45pm      Patient Care Plan: Medication Management     Problem Identified: Health Promotion or Disease Self-Management (General Plan of Care)      Goal: Medication Management   Note:   Current Barriers:  Unable to independently afford treatment regimen   Pharmacist Clinical Goal(s):  Over the next 90 days, patient will verbalize ability to afford treatment regimen through collaboration with PharmD and provider    Interventions: Inter-disciplinary care team collaboration (see longitudinal plan of care) Comprehensive medication review performed; medication list updated in electronic medical record    Patient Goals/Self-Care Activities Over the next 90 days, patient will:  - take medications as prescribed  Follow Up Plan: The patient has been provided with  contact information for the care management team and has been advised to call with any health related questions or concerns.

## 2020-12-08 NOTE — Patient Outreach (Addendum)
Medicaid Managed Care   Nurse Care Manager Note  12/08/2020 Name:  Brittany [name] MRN:  [id number] DOB:  01-Dec-1965  Brittany [name] is an 55 y.o. year old female who is a primary patient of Vevelyn Francois, NP.  [facility] Managed Care Coordination team was consulted for assistance with:    HTN Hx stroke  [name] was given information about Medicaid Managed Care Coordination team services today. [name] Patient agreed to services and verbal consent obtained.  Engaged with patient by telephone for follow up visit in response to provider referral for case management and/or care coordination services.   Assessments/Interventions:  Review of past medical history, allergies, medications, health status, including review of consultants reports, laboratory and other test data, was performed as part of comprehensive evaluation and provision of chronic care management services.  SDOH (Social Determinants of Health) assessments and interventions performed: SDOH Interventions    Flowsheet Row Most Recent Value  SDOH Interventions   Food Insecurity Interventions Other (Comment)  [Care Guide referral placed, patient received resources]  Transportation Interventions Intervention Not Indicated       Care Plan  Allergies  Allergen Reactions   Albuterol Swelling and Palpitations   Amitriptyline Other (See Comments)    Patient reported that it made her throat feel like its locking up and it also caused issues with her going to the bathroom   Duloxetine Other (See Comments)    Patient reported that it made her throat lock up and it caused her to have issue with going to the bathroom   Naproxen     Vomiting, sweating, abd spasms   Other     States can't take pain meds that end in "cet" or meds that end in "ine" Darvocet/severe vomiting   Darvon [Propoxyphene] Nausea And Vomiting   Hydrocodone Nausea And Vomiting   Lactose Intolerance (Gi)     Bloating, gas, abd pain   Latex  Itching and Rash   Oxycodone Nausea And Vomiting   Percocet [Oxycodone-Acetaminophen] Nausea And Vomiting   Topamax [Topiramate]     Memory made her emotional     Medications Reviewed Today     Reviewed by Melissa Montane, RN (Registered Nurse) on 12/08/20 at [place] List Status: <None>   Medication Order Taking? Sig Documenting Provider Last Dose Status Informant  albuterol (VENTOLIN HFA) 108 (90 Base) MCG/ACT inhaler [id number] No Inhale 2 puffs into the lungs every 4 (four) hours as needed for wheezing or shortness of breath.  Patient not taking: Reported on 12/08/2020   Vevelyn Francois, NP Not Taking Active            Med Note (Paislyn Domenico A   Wed Dec 08, 2020  3:52 PM) Had reaction, has new inhaler  amLODipine (NORVASC) 10 MG tablet [id number] Yes Take 1 tablet (10 mg total) by mouth daily. Vevelyn Francois, NP Taking Active   aspirin 325 MG tablet [id number] Yes Take 1 tablet (325 mg total) by mouth daily. Cristal Ford, DO Taking Active Self  atorvastatin (LIPITOR) 40 MG tablet [id number] Yes Take 1 tablet (40 mg total) by mouth daily. Vevelyn Francois, NP Taking Active   Blood Pressure Monitor KIT [id number] Yes 1 kit by Does not apply route daily. Vevelyn Francois, NP Taking Active   ciclopirox [facility]) 8 % solution [id number] Yes Apply topically at bedtime. Apply over nail and surrounding skin. Apply daily over previous coat. After seven (7) days, may remove with alcohol and continue  cycle. Trula Slade, DPM Taking Active   cloNIDine (CATAPRES) 0.2 MG tablet [id number] No Take by mouth.  Patient not taking: Reported on 12/08/2020   [provider] Not Taking Active   cyanocobalamin 1000 MCG tablet [id number] Yes Take 1 tablet (1,000 mcg total) by mouth daily. Azzie Glatter, FNP Taking Active   Diclofenac Sodium 2 % SOLN [id number] Yes Place 40 mg onto the skin 2 (two) times daily as needed (for knee pain). (40 mg = 2 pump actuations). Use as directed. Vevelyn Francois,  NP Taking Active   dimenhyDRINATE (DRAMAMINE) 50 MG tablet [id number] Yes Take 50 mg by mouth every 8 (eight) hours as needed for dizziness. [provider] Taking Active            Med Note [name] Dome Aug 04, 2019  2:20 PM) As needed.   docusate sodium (COLACE) 100 MG capsule [id number] Yes Take 200 mg by mouth 2 (two) times daily. [provider] Taking Active Self  Ergocalciferol (VITAMIN D2) 10 MCG (400 UNIT) TABS [id number] Yes Take 10 mcg by mouth daily. Azzie Glatter, FNP Taking Active   furosemide (LASIX) 20 MG tablet [id number] Yes Take 1 tablet (20 mg total) by mouth 2 (two) times daily as needed. Vevelyn Francois, NP Taking Active   gabapentin (NEURONTIN) 300 MG capsule [id number] Yes Take 1 capsule (300 mg total) by mouth 3 (three) times daily. Vevelyn Francois, NP Taking Active   hydrOXYzine (VISTARIL) 50 MG capsule [id number] Yes Take 1-2 capsules (50-100 mg total) by mouth 3 (three) times daily as needed for itching. Vevelyn Francois, NP Taking Active   methocarbamol (ROBAXIN) 500 MG tablet [id number] Yes Take 1 tablet (500 mg total) by mouth 3 (three) times daily. Vevelyn Francois, NP Taking Active   Multiple Vitamin (MULTIVITAMIN) capsule [id number] Yes Take 4 capsules by mouth 2 (two) times daily. Centrum plus [provider] Taking Active Self  omeprazole (PRILOSEC) 20 MG capsule [id number] Yes Take 1 capsule (20 mg total) by mouth 2 (two) times daily before a meal. Azzie Glatter, FNP Taking Active   senna (SENOKOT) 8.6 MG TABS tablet [id number] Yes Take 2 tablets (17.2 mg total) by mouth 2 (two) times daily. Vevelyn Francois, NP Taking Active   triamcinolone cream (KENALOG) 0.1 % [id number] No Apply 1 application topically 2 (two) times daily.  Patient not taking: Reported on 12/08/2020   Scot Jun, FNP Not Taking Active            Med Note Azzie Glatter   Mon Aug 04, 2019  2:21 PM) As needed.             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   B12 deficiency 12/19/2016   Chest pain 12/18/2016   Cerebellar infarct ([place])    Diastolic dysfunction    Marijuana abuse    History of tobacco abuse    Benign essential HTN    Tachycardia    Hypokalemia    Stroke (cerebrum) ([place]) 12/05/2016   Nonintractable headache 12/05/2016   Occipital  stroke ([place]) 12/05/2016   Hypertensive urgency 12/05/2016   Hypertension     Conditions to be addressed/monitored per PCP order:  HTN and hx stroke  Care Plan : Adult Health Management  Updates made by Melissa Montane, RN since 12/08/2020 12:00 AM     Problem: Chronic Pain Management (Chronic Pain)      Long-Range Goal: Chronic Pain Managed   Start Date: 09/22/2020  Expected End Date: 02/08/2021  Recent Progress: On track  Priority: High  Note:   Current Barriers:  Knowledge Deficits related to self-health management of acute or chronic pain Chronic Disease Management support and education needs related to chronic pain-Ms. Cuthbert has an appointment with a new PCP. She would like to discuss several concerns with her new PCP(new pain in both feet, needing a shower chair and BP monitor). She does not take all of her medications due to financial strain. She uses her monthly income to pay rent, insurance, power and gas. She has very little money left over.-Update-Ms. Tirey is seeing a podiatrist and starting PT for her feet pain(plantar fasciitis). She is getting her medications delivered and taking as directed. She has received resources and very thankful for the assistance. She is having pain and numbness in  her arms at night while trying to sleep. Chronic Disease Management support and education needs related to chronic pain Financial Constraints.  Does not adhere to prescribed medication regimen-Patient is unable to afford all of her medications Clinical Goal(s):  patient will verbalize understanding of plan for pain management. , patient will attend all scheduled medical appointments: 10/06/20 with new PCP, patient will demonstrate use of different relaxation  skills and/or diversional activities to assist with pain reduction (distraction, imagery, relaxation, massage, acupressure, TENS, heat, and cold application., and patient will use pharmacological and nonpharmacological pain relief strategies as prescribed.  Interventions:  Discussed plans with patient for ongoing care management follow up and provided patient with direct contact information for care management team Evaluation of current treatment plan related to pain and patient's adherence to plan as established by provider. Advised patient to make a list of things that she would like to discuss at her next appointment with PCP Provided education to patient re: managing pain Reviewed medications with patient and discussed the importance of taking all medications as directed Patient Goals/Self Care Activities:  - make a list of things you want to discuss at new PCP appointment - develop a personal pain management plan - keep track of prescription refills - plan exercise or activity when pain is best controlled - start a pain diary - track what makes the pain worse and what makes it better - use ice or heat for pain relief - work slower and less intense when having pain  Follow Up Plan: Telephone follow up appointment with care management team member scheduled for:02/08/21 @ 3:45pm     Problem: Hypertension (Hypertension)      Long-Range Goal: Hypertension Monitored   Start Date: 09/22/2020  Expected End Date: 02/08/2021  Recent  Progress: On track  Priority: High  Note:   Objective:  Last practice recorded BP readings:  BP Readings from Last 3 Encounters:  10/06/20 (!) 166/79  07/06/20 (!) 186/90  05/20/20 140/72   Most recent eGFR/CrCl: No results found for: EGFR  No components found for: CRCL  Current Barriers:  Knowledge Deficits related to basic understanding of hypertension pathophysiology and self care management-Ms. Walsworth is checking her BP daily, reading today 149/93. She cooks most of  her food and likes to eat fruits and vegetables. She has all of her medications and is taking as prescribed. Difficulty obtaining medications Non-adherence to prescribed medication regimen Financial Constraints.  Does not contact provider office for questions/concerns Case Manager Clinical Goal(s):  patient will verbalize understanding of plan for hypertension management patient will attend all scheduled medical appointments: 10/06/20 with new PCP patient will demonstrate improved adherence to prescribed treatment plan for hypertension as evidenced by taking all medications as prescribed, monitoring and recording blood pressure as directed, adhering to low sodium/DASH diet Interventions:  Provided education to patient re: stroke prevention, s/s of heart attack and stroke, DASH diet, complications of uncontrolled blood pressure Reviewed medications with patient and discussed importance of compliance Discussed plans with patient for ongoing care management follow up and provided patient with direct contact information for care management team Reviewed scheduled/upcoming provider appointments including: Physical Therapy, PCP and TFC  Patient Goals/Self-Care Activities:  - Self administers medications as prescribed - Attends all scheduled provider appointments - Calls provider office for new concerns, questions, or BP outside discussed parameters - Checks BP and records as discussed - Follows a low sodium diet/DASH  diet - increase exercise to 30 min/day for 5 days a week  RNCM will follow up with a telephone visit on 02/08/21 @ 3:45pm       Follow Up:  Patient agrees to Care Plan and Follow-up.  Plan: The Managed Medicaid care management team will reach out to the patient again over the next 60 days.  Date/time of next scheduled RN care management/care coordination outreach:  02/08/21 @ 3:45pm  Lurena Joiner RN, BSN [place]  [employer] manager

## 2020-12-13 ENCOUNTER — Encounter: Payer: Self-pay | Admitting: Physical Therapy

## 2020-12-13 ENCOUNTER — Ambulatory Visit: Payer: Medicaid Other | Admitting: Physical Therapy

## 2020-12-13 ENCOUNTER — Other Ambulatory Visit: Payer: Self-pay

## 2020-12-13 DIAGNOSIS — M25572 Pain in left ankle and joints of left foot: Secondary | ICD-10-CM

## 2020-12-13 DIAGNOSIS — R2689 Other abnormalities of gait and mobility: Secondary | ICD-10-CM

## 2020-12-13 DIAGNOSIS — M25571 Pain in right ankle and joints of right foot: Secondary | ICD-10-CM

## 2020-12-13 DIAGNOSIS — M6281 Muscle weakness (generalized): Secondary | ICD-10-CM

## 2020-12-13 NOTE — Therapy (Signed)
[facility] [address]  [place], [place], [address] Phone: [phone]   Fax:  [phone]  Physical Therapy Treatment  Patient Details  Name: Brittany [name] MRN: [id number] Date of Birth: [birth year] Referring Provider (PT): Celesta Gentile DPM   Encounter Date: 12/13/2020   PT End of Session - 12/13/20 1812     Visit Number 2    Number of Visits 12    Date for PT Re-Evaluation 01/18/21    Authorization Type UHC Medicaid    Progress Note Due on Visit 10    PT Start Time 1450    PT Stop Time 1530    PT Time Calculation (min) 40 min    Activity Tolerance Patient tolerated treatment well    Behavior During Therapy [facility] for tasks assessed/performed             Past Medical History:  Diagnosis Date   Anemia    Arthritis    trigger finger in left hand   Complication of anesthesia    per pt, hard to wake up!   Elevated cholesterol    Female bladder prolapse    per urologist, does not have prolaspe   GERD (gastroesophageal reflux disease)    Heart murmur    pt unsure.   Hiatal hernia    Hypertension    IBS (irritable bowel syndrome)    Multiple myeloma ([place]) 2005   had partial chemo   Peripheral neuropathy    SOB (shortness of breath) on exertion    uses an inhaler   Stroke [facility]) 2017   paralysis left arm/uses a walker    Past Surgical History:  Procedure Laterality Date   BONE BIOPSY  2005   in her back   COLONOSCOPY     over 10 years x3   ESOPHAGOGASTRODUODENOSCOPY     incomplete-over 10 years ago    TEE WITHOUT CARDIOVERSION N/A 12/08/2016   Procedure: TRANSESOPHAGEAL ECHOCARDIOGRAM (TEE);  Surgeon: Sueanne Margarita, MD;  Location: [facility] ENDOSCOPY;  Service: Cardiovascular;  Laterality: N/A;   TUBAL LIGATION     UPPER GASTROINTESTINAL ENDOSCOPY     WISDOM TOOTH EXTRACTION      There were no vitals filed for this visit.   Subjective Assessment - 12/13/20 1456     Subjective Brittany [name]  reports her hands are going numb at night when sleeping and wants to know what to do about that.  She has her AFOs today, they feel a little better since she "cut out" the callous.  L knee is very painful today as well.    Pertinent History CVA    Limitations Walking;Standing    How long can you sit comfortably? 30 minutes then needs to move due to L knee pain    How long can you stand comfortably? 30 minutes    How long can you walk comfortably? ~ 10 minutes, limited by SOB and LLE pain/weakness    Diagnostic tests X-rays of heels -  No evidence of acute fracture or stress fracture identified.    Patient Stated Goals "leg strength and walk better"    Currently in Pain? Yes    Pain Score 7     Pain Location Knee    Pain Orientation Left    Pain Onset Other (comment)   4 years ago   Multiple Pain Sites Yes    Pain Score 5    Pain Location Heel    Pain Orientation Right;Left  [facility] Adult PT Treatment/Exercise - 12/13/20 0001       Manual Therapy   Manual Therapy Joint mobilization;Soft tissue mobilization    Manual therapy comments to bil ankles/feet in prone to improve ROM    Joint Mobilization mobs to rear foot and fore foot    Soft tissue mobilization IASTM with s/s tools to plantar fascia, achilles insertion, and gastrocs.  Tolerated well.      Ankle Exercises: Stretches   Gastroc Stretch 2 reps;20 seconds    Gastroc Stretch Limitations seated with towel      Ankle Exercises: Seated   BAPS Sitting;Level 2;10 reps;Limitations   x 10 DF/PF, x 10 Ever/Inv, x10 CW, x 10 CCW, bil LE, tactile cues and minA to complete full ROM     Ankle Exercises: Aerobic   Nustep L5 x 6 min                     PT Education - 12/13/20 1812     Education Details progressed HEP    Person(s) Educated Patient    Methods Explanation;Demonstration;Verbal cues;Handout    Comprehension Verbalized understanding;Returned demonstration               PT Short Term Goals - 12/13/20 1819       PT SHORT TERM GOAL #1   Title Patient to be independent with initial HEP.    Baseline initial HEP given    Time 2    Period Weeks    Status Achieved   12/13/20- reports compliance.   Target Date 12/21/20               PT Long Term Goals - 12/07/20 1432       PT LONG TERM GOAL #1   Title Patient to be independent with advanced HEP.    Time 6    Period Weeks    Status New    Target Date 01/18/21      PT LONG TERM GOAL #2   Title Pt. will demonstrate 4+/5 bil LE strength throughout for safety with gait.    Baseline 4/5 hip strength    Time 6    Period Weeks    Status New    Target Date 01/18/21      PT LONG TERM GOAL #3   Title Pt. will demonstrate at least 10 deg bil ankle DF for improve gait mechanics.    Baseline 0 deg ankle DF bil, gastroc and soleus tightness.    Time 6    Period Weeks    Status New    Target Date 01/18/21      PT LONG TERM GOAL #4   Title Patient to report tolerance for 30 minutes on her feet without pain limiting.    Baseline increased bil heel pain with standing walking +10 min    Time 6    Period Weeks    Status New    Target Date 01/18/21      PT LONG TERM GOAL #5   Title Pt. will be assessed for fall risk with appropriate goal TBD    Time 6    Period Weeks    Status New    Target Date 01/18/21                   Plan - 12/13/20 1813     Clinical Impression Statement Brittany [name] was wearing her AFOs today - she had carbon fiber AFOs, checked and there was nothing protruding or  any areas of concerns today, she reports they were not hurting today.  She does IR L foot compared to R during gait.   She reports exercises are helping and good compliance, still having trouble in AM, so added seated towel stretch to do before getting out of bed in addition to ankle circles.  She demonstrated improve R ankle DF today compared to L ankle DF, which is still neutral.  Recommended  trying wrist splints at night to improve hand numbness with sleep.  Interventions today focused on ankle strengthening followed by manual therapy to improve ankle ROM and decrease pain. She would benefit from continued skilled therapy.    Personal Factors and Comorbidities Comorbidity 3+    Comorbidities history of CVA, balance deficits, L knee pain, HTN    Examination-Activity Limitations Locomotion Level;Stairs;Stand    Examination-Participation Restrictions Community Activity;Shop    Stability/Clinical Decision Making Evolving/Moderate complexity    Rehab Potential Good    PT Frequency 2x / week    PT Duration 6 weeks    PT Treatment/Interventions ADLs/Self Care Home Management;Cryotherapy;Moist Heat;Stair training;Therapeutic activities;Gait training;Therapeutic exercise;Balance training;Neuromuscular re-education;Orthotic Fit/Training;Patient/family education;Manual techniques;Passive range of motion;Dry needling;Taping;Joint Manipulations    PT Next Visit Plan review HEP for stretches, assess balance, ankle/LE strengthening.    PT Home Exercise Plan Access Code: [email or web address]    Consulted and Agree with Plan of Care Patient             Patient will benefit from skilled therapeutic intervention in order to improve the following deficits and impairments:  Abnormal gait, Decreased activity tolerance, Decreased endurance, Decreased range of motion, Decreased strength, Increased fascial restricitons, Decreased balance, Decreased mobility, Difficulty walking, Increased muscle spasms, Impaired flexibility, Pain  Visit Diagnosis: Pain in left ankle and joints of left foot  Pain in right ankle and joints of right foot  Other abnormalities of gait and mobility  Muscle weakness (generalized)     Problem List Patient Active Problem List   Diagnosis Date Noted   Ganglion cyst of volar aspect of left wrist 05/20/2020   Trigger thumb of left hand 05/20/2020   Labral tear of shoulder,  degenerative, right 05/20/2020   Anxiety 02/05/2019   Oral thrush 10/08/2018   Bilateral leg pain 10/08/2018   Constipation 10/08/2018   Shortness of breath 10/08/2018   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  [place], [place], [address] Phone: [phone]   Fax:  [phone]  Name: Brittany [name] MRN: [id number] Date of Birth: 12-15-1965

## 2020-12-13 NOTE — Patient Instructions (Signed)
Access Code: [email or web address] URL: [email or web address] Date: 12/13/2020 Prepared by: Glenetta Hew  Exercises Long Sitting Calf Stretch with Strap - 1 x daily - 7 x weekly - 1 sets - 3 reps - 15 hold Seated Gastroc Stretch with Strap - 1 x daily - 7 x weekly - 1 sets - 3 reps - 15 hold

## 2020-12-21 ENCOUNTER — Ambulatory Visit: Payer: Medicaid Other | Attending: Podiatry | Admitting: Physical Therapy

## 2020-12-21 ENCOUNTER — Other Ambulatory Visit: Payer: Self-pay

## 2020-12-21 ENCOUNTER — Encounter: Payer: Self-pay | Admitting: Physical Therapy

## 2020-12-21 DIAGNOSIS — M6281 Muscle weakness (generalized): Secondary | ICD-10-CM | POA: Diagnosis present

## 2020-12-21 DIAGNOSIS — M25572 Pain in left ankle and joints of left foot: Secondary | ICD-10-CM | POA: Insufficient documentation

## 2020-12-21 DIAGNOSIS — M25571 Pain in right ankle and joints of right foot: Secondary | ICD-10-CM | POA: Diagnosis present

## 2020-12-21 DIAGNOSIS — R2689 Other abnormalities of gait and mobility: Secondary | ICD-10-CM | POA: Insufficient documentation

## 2020-12-21 NOTE — Therapy (Signed)
[facility] [address]  [place], [place], [address] Phone: [phone]   Fax:  [phone]  Physical Therapy Treatment  Patient Details  Name: Brittany [name] MRN: [id number] Date of Birth: 11/28/1965 Referring Provider (PT): Celesta Gentile DPM   Encounter Date: 12/21/2020   PT End of Session - 12/21/20 1514     Visit Number 3    Number of Visits 12    Date for PT Re-Evaluation 01/18/21    Authorization Type UHC Medicaid    Progress Note Due on Visit 10    PT Start Time 1317    PT Stop Time 1358    PT Time Calculation (min) 41 min    Activity Tolerance Patient tolerated treatment well    Behavior During Therapy [facility] for tasks assessed/performed             Past Medical History:  Diagnosis Date   Anemia    Arthritis    trigger finger in left hand   Complication of anesthesia    per pt, hard to wake up!   Elevated cholesterol    Female bladder prolapse    per urologist, does not have prolaspe   GERD (gastroesophageal reflux disease)    Heart murmur    pt unsure.   Hiatal hernia    Hypertension    IBS (irritable bowel syndrome)    Multiple myeloma ([place]) 2005   had partial chemo   Peripheral neuropathy    SOB (shortness of breath) on exertion    uses an inhaler   Stroke [facility]) 2017   paralysis left arm/uses a walker    Past Surgical History:  Procedure Laterality Date   BONE BIOPSY  2005   in her back   COLONOSCOPY     over 10 years x3   ESOPHAGOGASTRODUODENOSCOPY     incomplete-over 10 years ago    TEE WITHOUT CARDIOVERSION N/A 12/08/2016   Procedure: TRANSESOPHAGEAL ECHOCARDIOGRAM (TEE);  Surgeon: Sueanne Margarita, MD;  Location: [facility] ENDOSCOPY;  Service: Cardiovascular;  Laterality: N/A;   TUBAL LIGATION     UPPER GASTROINTESTINAL ENDOSCOPY     WISDOM TOOTH EXTRACTION      There were no vitals filed for this visit.   Subjective Assessment - 12/21/20 1511     Subjective Brittany [name]  reports having a "rough" day, thinks her IBS symptoms are acting up, having sharp chest pain symptoms earlier in the day.  She reports her feet are doing better, only hurt where those "knots" (thickened skin buildup on great toes) are.  Reports compliance with HEP.    Pertinent History CVA    Limitations Walking;Standing    How long can you sit comfortably? 30 minutes then needs to move due to L knee pain    How long can you stand comfortably? 30 minutes    How long can you walk comfortably? ~ 10 minutes, limited by SOB and LLE pain/weakness    Diagnostic tests X-rays of heels -  No evidence of acute fracture or stress fracture identified.    Patient Stated Goals "leg strength and walk better"    Currently in Pain? No/denies    Pain Onset Other (comment)   4 years ago                              [facility] Adult PT Treatment/Exercise - 12/21/20 0001       Manual Therapy  Manual Therapy Joint mobilization;Soft tissue mobilization    Manual therapy comments to bil ankles/feet in prone to improve ROM    Joint Mobilization mobs to rear foot and fore foot    Soft tissue mobilization IASTM with s/s tools to plantar fascia, achilles insertion, and gastrocs.  Tolerated well.      Ankle Exercises: Aerobic   Nustep L5 x 3 min                       PT Short Term Goals - 12/13/20 1819       PT SHORT TERM GOAL #1   Title Patient to be independent with initial HEP.    Baseline initial HEP given    Time 2    Period Weeks    Status Achieved   12/13/20- reports compliance.   Target Date 12/21/20               PT Long Term Goals - 12/21/20 1517       PT LONG TERM GOAL #1   Title Patient to be independent with advanced HEP.    Time 6    Period Weeks    Status On-going      PT LONG TERM GOAL #2   Title Pt. will demonstrate 4+/5 bil LE strength throughout for safety with gait.    Baseline 4/5 hip strength    Time 6    Period Weeks    Status On-going       PT LONG TERM GOAL #3   Title Pt. will demonstrate at least 10 deg bil ankle DF for improve gait mechanics.    Baseline 0 deg ankle DF bil, gastroc and soleus tightness.    Time 6    Period Weeks    Status On-going   12/21/20- noted improved DF bil     PT LONG TERM GOAL #4   Title Patient to report tolerance for 30 minutes on her feet without pain limiting.    Baseline increased bil heel pain with standing walking +10 min    Time 6    Period Weeks    Status On-going   12/21/20- reporting decreasing heel pain, pain in great toe due to callouses     PT LONG TERM GOAL #5   Title Pt. will be assessed for fall risk with appropriate goal TBD    Time 6    Period Weeks    Status New                   Plan - 12/21/20 1515     Clinical Impression Statement Brittany [name] demonstrated decreased tolerance to exercise today secondary to IBS symptoms, so focused interventions on manual therapy to decrease pain in feet and improve ankle ROM.  While did not formally measure ankle ROM noted significant improvement in bil ankle PF with L ankle PF beyond neutral today.  She had tightness throughout anter tib/gastrocs/and fibularis muscles.  discussed dry needling as potential option to decrease muscle tightness/trigger points. She reported decreased pain and discomfort after interventions today and would benefit from continued skilled therapy.    Personal Factors and Comorbidities Comorbidity 3+    Comorbidities history of CVA, balance deficits, L knee pain, HTN    Examination-Activity Limitations Locomotion Level;Stairs;Stand    Examination-Participation Restrictions Community Activity;Shop    Stability/Clinical Decision Making Evolving/Moderate complexity    Rehab Potential Good    PT Frequency 2x / week    PT Duration 6 weeks  PT Treatment/Interventions ADLs/Self Care Home Management;Cryotherapy;Moist Heat;Stair training;Therapeutic activities;Gait training;Therapeutic exercise;Balance  training;Neuromuscular re-education;Orthotic Fit/Training;Patient/family education;Manual techniques;Passive range of motion;Dry needling;Taping;Joint Manipulations    PT Next Visit Plan review HEP for stretches, assess balance, ankle/LE strengthening.    PT Home Exercise Plan Access Code: [email or web address]    Consulted and Agree with Plan of Care Patient             Patient will benefit from skilled therapeutic intervention in order to improve the following deficits and impairments:  Abnormal gait, Decreased activity tolerance, Decreased endurance, Decreased range of motion, Decreased strength, Increased fascial restricitons, Decreased balance, Decreased mobility, Difficulty walking, Increased muscle spasms, Impaired flexibility, Pain  Visit Diagnosis: Pain in left ankle and joints of left foot  Pain in right ankle and joints of right foot  Other abnormalities of gait and mobility  Muscle weakness (generalized)     Problem List Patient Active Problem List   Diagnosis Date Noted   Ganglion cyst of volar aspect of left wrist 05/20/2020   Trigger thumb of left hand 05/20/2020   Labral tear of shoulder, degenerative, right 05/20/2020   Anxiety 02/05/2019   Oral thrush 10/08/2018   Bilateral leg pain 10/08/2018   Constipation 10/08/2018   Shortness of breath 10/08/2018   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  [place], [place], [address] Phone: [phone]    Fax:  [phone]  Name: Brittany [name] MRN: [id number] Date of Birth: 01-Nov-1965

## 2020-12-22 ENCOUNTER — Ambulatory Visit: Payer: Medicaid Other

## 2020-12-22 DIAGNOSIS — M25571 Pain in right ankle and joints of right foot: Secondary | ICD-10-CM

## 2020-12-22 DIAGNOSIS — R2689 Other abnormalities of gait and mobility: Secondary | ICD-10-CM

## 2020-12-22 DIAGNOSIS — M25572 Pain in left ankle and joints of left foot: Secondary | ICD-10-CM

## 2020-12-22 DIAGNOSIS — M6281 Muscle weakness (generalized): Secondary | ICD-10-CM

## 2020-12-22 NOTE — Therapy (Signed)
[facility] [address]  [place], [place], [address] Phone: [phone]   Fax:  [phone]  Physical Therapy Treatment  Patient Details  Name: Brittany [name] MRN: [id number] Date of Birth: 1965/09/17 Referring Provider (PT): Celesta Gentile DPM   Encounter Date: 12/22/2020   PT End of Session - 12/22/20 1447     Visit Number 4    Number of Visits 12    Date for PT Re-Evaluation 01/18/21    Authorization Type UHC Medicaid    Progress Note Due on Visit 10    PT Start Time 1402    PT Stop Time 1444    PT Time Calculation (min) 42 min    Activity Tolerance Patient tolerated treatment well    Behavior During Therapy [facility] for tasks assessed/performed             Past Medical History:  Diagnosis Date   Anemia    Arthritis    trigger finger in left hand   Complication of anesthesia    per pt, hard to wake up!   Elevated cholesterol    Female bladder prolapse    per urologist, does not have prolaspe   GERD (gastroesophageal reflux disease)    Heart murmur    pt unsure.   Hiatal hernia    Hypertension    IBS (irritable bowel syndrome)    Multiple myeloma ([place]) 2005   had partial chemo   Peripheral neuropathy    SOB (shortness of breath) on exertion    uses an inhaler   Stroke [facility]) 2017   paralysis left arm/uses a walker    Past Surgical History:  Procedure Laterality Date   BONE BIOPSY  2005   in her back   COLONOSCOPY     over 10 years x3   ESOPHAGOGASTRODUODENOSCOPY     incomplete-over 10 years ago    TEE WITHOUT CARDIOVERSION N/A 12/08/2016   Procedure: TRANSESOPHAGEAL ECHOCARDIOGRAM (TEE);  Surgeon: Sueanne Margarita, MD;  Location: [facility] ENDOSCOPY;  Service: Cardiovascular;  Laterality: N/A;   TUBAL LIGATION     UPPER GASTROINTESTINAL ENDOSCOPY     WISDOM TOOTH EXTRACTION      There were no vitals filed for this visit.   Subjective Assessment - 12/22/20 1404     Subjective Pt reports  that she isn't having any sharp chest pain today. The massages have been helping. Still having some foot pain more in her R foot today.    Pertinent History CVA    Limitations Walking;Standing    How long can you sit comfortably? 30 minutes then needs to move due to L knee pain    How long can you stand comfortably? 30 minutes    How long can you walk comfortably? ~ 10 minutes, limited by SOB and LLE pain/weakness    Diagnostic tests X-rays of heels -  No evidence of acute fracture or stress fracture identified.    Patient Stated Goals "leg strength and walk better"    Currently in Pain? Yes    Pain Score 8     Pain Location Foot    Pain Orientation Right    Pain Descriptors / Indicators Throbbing    Pain Type Chronic pain    Pain Onset Other (comment)   4 years ago                              [facility] Adult  PT Treatment/Exercise - 12/22/20 0001       Manual Therapy   Manual Therapy Soft tissue mobilization    Soft tissue mobilization STM to B plantar fascia      Ankle Exercises: Aerobic   Nustep L2 x 6 min (LE only), dropped level due to c/o knee pain      Ankle Exercises: Stretches   Plantar Fascia Stretch 30 seconds   with towel iin sitting   Gastroc Stretch 2 reps;30 seconds    Gastroc Stretch Limitations Bilat; seated with strap      Ankle Exercises: Seated   Heel Raises Both;20 reps    Toe Raise 20 reps    Other Seated Ankle Exercises B toe curls 20x    Other Seated Ankle Exercises R toe yoga 10x                       PT Short Term Goals - 12/13/20 1819       PT SHORT TERM GOAL #1   Title Patient to be independent with initial HEP.    Baseline initial HEP given    Time 2    Period Weeks    Status Achieved   12/13/20- reports compliance.   Target Date 12/21/20               PT Long Term Goals - 12/21/20 1517       PT LONG TERM GOAL #1   Title Patient to be independent with advanced HEP.    Time 6    Period Weeks     Status On-going      PT LONG TERM GOAL #2   Title Pt. will demonstrate 4+/5 bil LE strength throughout for safety with gait.    Baseline 4/5 hip strength    Time 6    Period Weeks    Status On-going      PT LONG TERM GOAL #3   Title Pt. will demonstrate at least 10 deg bil ankle DF for improve gait mechanics.    Baseline 0 deg ankle DF bil, gastroc and soleus tightness.    Time 6    Period Weeks    Status On-going   12/21/20- noted improved DF bil     PT LONG TERM GOAL #4   Title Patient to report tolerance for 30 minutes on her feet without pain limiting.    Baseline increased bil heel pain with standing walking +10 min    Time 6    Period Weeks    Status On-going   12/21/20- reporting decreasing heel pain, pain in great toe due to callouses     PT LONG TERM GOAL #5   Title Pt. will be assessed for fall risk with appropriate goal TBD    Time 6    Period Weeks    Status New                   Plan - 12/22/20 1447     Clinical Impression Statement Pt had no c/o sharp chest pains today. She noted increased R foot pain w/o a known cause. Worked more on foot and ankle mobility today, gave instructions with exercises for form and technique. She also had reports of L knee pain during nustep warm up. Finished session with STM to B plantar fascia to reduce pain and increase flexibility of the fascia. She may benefit from propriceptive exercses to improve ankle strength and balance.    Personal Factors and  Comorbidities Comorbidity 3+    Comorbidities history of CVA, balance deficits, L knee pain, HTN    Examination-Activity Limitations Locomotion Level;Stairs;Stand    Examination-Participation Restrictions Community Activity;Shop    Stability/Clinical Decision Making Evolving/Moderate complexity    Rehab Potential Good    PT Frequency 2x / week    PT Duration 6 weeks    PT Treatment/Interventions ADLs/Self Care Home Management;Cryotherapy;Moist Heat;Stair training;Therapeutic  activities;Gait training;Therapeutic exercise;Balance training;Neuromuscular re-education;Orthotic Fit/Training;Patient/family education;Manual techniques;Passive range of motion;Dry needling;Taping;Joint Manipulations    PT Next Visit Plan review HEP for stretches, assess balance, ankle/LE strengthening.    PT Home Exercise Plan Access Code: [email or web address]    Consulted and Agree with Plan of Care Patient             Patient will benefit from skilled therapeutic intervention in order to improve the following deficits and impairments:  Abnormal gait, Decreased activity tolerance, Decreased endurance, Decreased range of motion, Decreased strength, Increased fascial restricitons, Decreased balance, Decreased mobility, Difficulty walking, Increased muscle spasms, Impaired flexibility, Pain  Visit Diagnosis: Pain in left ankle and joints of left foot  Pain in right ankle and joints of right foot  Other abnormalities of gait and mobility  Muscle weakness (generalized)     Problem List Patient Active Problem List   Diagnosis Date Noted   Ganglion cyst of volar aspect of left wrist 05/20/2020   Trigger thumb of left hand 05/20/2020   Labral tear of shoulder, degenerative, right 05/20/2020   Anxiety 02/05/2019   Oral thrush 10/08/2018   Bilateral leg pain 10/08/2018   Constipation 10/08/2018   Shortness of breath 10/08/2018   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  [place], [place], [address] Phone: [phone]   Fax:  [phone]  Name: Brittany [name] MRN: [id number] Date of Birth: [birth year]

## 2020-12-28 ENCOUNTER — Other Ambulatory Visit: Payer: Self-pay

## 2020-12-28 ENCOUNTER — Ambulatory Visit: Payer: Medicaid Other

## 2020-12-28 DIAGNOSIS — M25571 Pain in right ankle and joints of right foot: Secondary | ICD-10-CM

## 2020-12-28 DIAGNOSIS — M25572 Pain in left ankle and joints of left foot: Secondary | ICD-10-CM | POA: Diagnosis not present

## 2020-12-28 DIAGNOSIS — M6281 Muscle weakness (generalized): Secondary | ICD-10-CM

## 2020-12-28 DIAGNOSIS — R2689 Other abnormalities of gait and mobility: Secondary | ICD-10-CM

## 2020-12-28 NOTE — Therapy (Signed)
[facility] [address]  [place], [place], [address] Phone: [phone]   Fax:  [phone]  Physical Therapy Treatment  Patient Details  Name: Brittany [name] MRN: [id number] Date of Birth: March 10, 1966 Referring Provider (PT): Celesta Gentile DPM   Encounter Date: 12/28/2020   PT End of Session - 12/28/20 1442     Visit Number 5    Number of Visits 12    Date for PT Re-Evaluation 01/18/21    Authorization Type UHC Medicaid    Progress Note Due on Visit 10    PT Start Time 1400    PT Stop Time 1444    PT Time Calculation (min) 44 min    Activity Tolerance Patient tolerated treatment well    Behavior During Therapy [facility] for tasks assessed/performed             Past Medical History:  Diagnosis Date   Anemia    Arthritis    trigger finger in left hand   Complication of anesthesia    per pt, hard to wake up!   Elevated cholesterol    Female bladder prolapse    per urologist, does not have prolaspe   GERD (gastroesophageal reflux disease)    Heart murmur    pt unsure.   Hiatal hernia    Hypertension    IBS (irritable bowel syndrome)    Multiple myeloma ([place]) 2005   had partial chemo   Peripheral neuropathy    SOB (shortness of breath) on exertion    uses an inhaler   Stroke [facility]) 2017   paralysis left arm/uses a walker    Past Surgical History:  Procedure Laterality Date   BONE BIOPSY  2005   in her back   COLONOSCOPY     over 10 years x3   ESOPHAGOGASTRODUODENOSCOPY     incomplete-over 10 years ago    TEE WITHOUT CARDIOVERSION N/A 12/08/2016   Procedure: TRANSESOPHAGEAL ECHOCARDIOGRAM (TEE);  Surgeon: Sueanne Margarita, MD;  Location: [facility] ENDOSCOPY;  Service: Cardiovascular;  Laterality: N/A;   TUBAL LIGATION     UPPER GASTROINTESTINAL ENDOSCOPY     WISDOM TOOTH EXTRACTION      There were no vitals filed for this visit.   Subjective Assessment - 12/28/20 1403     Subjective Pt reports  continued pain on her R foot around the big toe.    Pertinent History CVA    Diagnostic tests X-rays of heels -  No evidence of acute fracture or stress fracture identified.    Patient Stated Goals "leg strength and walk better"    Currently in Pain? Yes    Pain Score 8     Pain Location Foot    Pain Orientation Right    Pain Descriptors / Indicators Sharp;Throbbing    Pain Type Chronic pain                               [facility] Adult PT Treatment/Exercise - 12/28/20 0001       Manual Therapy   Manual Therapy Soft tissue mobilization    Soft tissue mobilization IASTM to R plantar fascia, gastroc, soleus, achilles insertion, peroneals      Ankle Exercises: Aerobic   Nustep L3x89mn      Ankle Exercises: Stretches   Soleus Stretch 2 reps;30 seconds    Gastroc Stretch 2 reps;30 seconds      Ankle Exercises: Seated  Ankle Circles/Pumps AROM;Both   ABCs                      PT Short Term Goals - 12/13/20 1819       PT SHORT TERM GOAL #1   Title Patient to be independent with initial HEP.    Baseline initial HEP given    Time 2    Period Weeks    Status Achieved   12/13/20- reports compliance.   Target Date 12/21/20               PT Long Term Goals - 12/21/20 1517       PT LONG TERM GOAL #1   Title Patient to be independent with advanced HEP.    Time 6    Period Weeks    Status On-going      PT LONG TERM GOAL #2   Title Pt. will demonstrate 4+/5 bil LE strength throughout for safety with gait.    Baseline 4/5 hip strength    Time 6    Period Weeks    Status On-going      PT LONG TERM GOAL #3   Title Pt. will demonstrate at least 10 deg bil ankle DF for improve gait mechanics.    Baseline 0 deg ankle DF bil, gastroc and soleus tightness.    Time 6    Period Weeks    Status On-going   12/21/20- noted improved DF bil     PT LONG TERM GOAL #4   Title Patient to report tolerance for 30 minutes on her feet without pain  limiting.    Baseline increased bil heel pain with standing walking +10 min    Time 6    Period Weeks    Status On-going   12/21/20- reporting decreasing heel pain, pain in great toe due to callouses     PT LONG TERM GOAL #5   Title Pt. will be assessed for fall risk with appropriate goal TBD    Time 6    Period Weeks    Status New                   Plan - 12/28/20 1443     Clinical Impression Statement Pt presented with hard calluses on the bottom of both big toes, she was educated on reaching out to her doctor about this. Due to her reporting more R foot pain MT focused on that side and more ROM noted after manual work. Followed with stretches and more stiffness noted with the left ankle during ABCs. Pt would benefit from more soft tissue work and stretches to reduce pain and restore functional ROM.    Personal Factors and Comorbidities Comorbidity 3+    Comorbidities history of CVA, balance deficits, L knee pain, HTN    PT Frequency 2x / week    PT Duration 6 weeks    PT Treatment/Interventions ADLs/Self Care Home Management;Cryotherapy;Moist Heat;Stair training;Therapeutic activities;Gait training;Therapeutic exercise;Balance training;Neuromuscular re-education;Orthotic Fit/Training;Patient/family education;Manual techniques;Passive range of motion;Dry needling;Taping;Joint Manipulations    PT Next Visit Plan review HEP for stretches, assess balance, ankle/LE strengthening.    PT Home Exercise Plan Access Code: [email or web address]    Consulted and Agree with Plan of Care Patient             Patient will benefit from skilled therapeutic intervention in order to improve the following deficits and impairments:  Abnormal gait, Decreased activity tolerance, Decreased endurance, Decreased range of motion, Decreased strength,  Increased fascial restricitons, Decreased balance, Decreased mobility, Difficulty walking, Increased muscle spasms, Impaired flexibility, Pain  Visit  Diagnosis: Pain in left ankle and joints of left foot  Pain in right ankle and joints of right foot  Other abnormalities of gait and mobility  Muscle weakness (generalized)     Problem List Patient Active Problem List   Diagnosis Date Noted   Ganglion cyst of volar aspect of left wrist 05/20/2020   Trigger thumb of left hand 05/20/2020   Labral tear of shoulder, degenerative, right 05/20/2020   Anxiety 02/05/2019   Oral thrush 10/08/2018   Bilateral leg pain 10/08/2018   Constipation 10/08/2018   Shortness of breath 10/08/2018   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  [place], [place], [address] Phone: [phone]   Fax:  [phone]  Name: Enrika Aguado MRN: [id number] Date of Birth: [birth year]

## 2020-12-29 ENCOUNTER — Ambulatory Visit: Payer: Medicaid Other

## 2020-12-29 DIAGNOSIS — R2689 Other abnormalities of gait and mobility: Secondary | ICD-10-CM

## 2020-12-29 DIAGNOSIS — M6281 Muscle weakness (generalized): Secondary | ICD-10-CM

## 2020-12-29 DIAGNOSIS — M25572 Pain in left ankle and joints of left foot: Secondary | ICD-10-CM

## 2020-12-29 DIAGNOSIS — M25571 Pain in right ankle and joints of right foot: Secondary | ICD-10-CM

## 2020-12-29 NOTE — Therapy (Signed)
[facility] [address]  [place], [place], [address] Phone: [phone]   Fax:  [phone]  Physical Therapy Treatment  Patient Details  Name: Brittany [name] MRN: [id number] Date of Birth: 09-Feb-1966 Referring Provider (PT): Celesta Gentile DPM   Encounter Date: 12/29/2020   PT End of Session - 12/29/20 1446     Visit Number 6    Number of Visits 12    Date for PT Re-Evaluation 01/18/21    Authorization Type UHC Medicaid    Progress Note Due on Visit 10    PT Start Time 1402    PT Stop Time 1443    PT Time Calculation (min) 41 min    Activity Tolerance Patient tolerated treatment well    Behavior During Therapy [facility] for tasks assessed/performed             Past Medical History:  Diagnosis Date   Anemia    Arthritis    trigger finger in left hand   Complication of anesthesia    per pt, hard to wake up!   Elevated cholesterol    Female bladder prolapse    per urologist, does not have prolaspe   GERD (gastroesophageal reflux disease)    Heart murmur    pt unsure.   Hiatal hernia    Hypertension    IBS (irritable bowel syndrome)    Multiple myeloma ([place]) 2005   had partial chemo   Peripheral neuropathy    SOB (shortness of breath) on exertion    uses an inhaler   Stroke [facility]) 2017   paralysis left arm/uses a walker    Past Surgical History:  Procedure Laterality Date   BONE BIOPSY  2005   in her back   COLONOSCOPY     over 10 years x3   ESOPHAGOGASTRODUODENOSCOPY     incomplete-over 10 years ago    TEE WITHOUT CARDIOVERSION N/A 12/08/2016   Procedure: TRANSESOPHAGEAL ECHOCARDIOGRAM (TEE);  Surgeon: Sueanne Margarita, MD;  Location: [facility] ENDOSCOPY;  Service: Cardiovascular;  Laterality: N/A;   TUBAL LIGATION     UPPER GASTROINTESTINAL ENDOSCOPY     WISDOM TOOTH EXTRACTION      There were no vitals filed for this visit.   Subjective Assessment - 12/29/20 1409     Subjective Pt reports  that her R big toe is bothering her again and waiting for doctor appointment. Also reports some L knee pain.    Pertinent History CVA    Diagnostic tests X-rays of heels -  No evidence of acute fracture or stress fracture identified.    Patient Stated Goals "leg strength and walk better"    Currently in Pain? Yes    Pain Score 8     Pain Location Foot    Pain Orientation Right    Pain Descriptors / Indicators Throbbing    Pain Type Chronic pain                               [facility] Adult PT Treatment/Exercise - 12/29/20 0001       Exercises   Exercises Knee/Hip      Knee/Hip Exercises: Supine   Hip Adduction Isometric Strengthening;Both;10 reps    Hip Adduction Isometric Limitations 5 sec hold    Bridges Strengthening;Both;2 sets;10 reps    Other Supine Knee/Hip Exercises supine marches 2x10 red TB    Other Supine Knee/Hip Exercises clams with red  TB 10x3"      Manual Therapy   Manual Therapy Soft tissue mobilization    Soft tissue mobilization IASTM to R/L gastroc, soleus, achilles insertion, peroneals      Ankle Exercises: Aerobic   Nustep L4x79mn                       PT Short Term Goals - 12/13/20 1819       PT SHORT TERM GOAL #1   Title Patient to be independent with initial HEP.    Baseline initial HEP given    Time 2    Period Weeks    Status Achieved   12/13/20- reports compliance.   Target Date 12/21/20               PT Long Term Goals - 12/21/20 1517       PT LONG TERM GOAL #1   Title Patient to be independent with advanced HEP.    Time 6    Period Weeks    Status On-going      PT LONG TERM GOAL #2   Title Pt. will demonstrate 4+/5 bil LE strength throughout for safety with gait.    Baseline 4/5 hip strength    Time 6    Period Weeks    Status On-going      PT LONG TERM GOAL #3   Title Pt. will demonstrate at least 10 deg bil ankle DF for improve gait mechanics.    Baseline 0 deg ankle DF bil, gastroc and  soleus tightness.    Time 6    Period Weeks    Status On-going   12/21/20- noted improved DF bil     PT LONG TERM GOAL #4   Title Patient to report tolerance for 30 minutes on her feet without pain limiting.    Baseline increased bil heel pain with standing walking +10 min    Time 6    Period Weeks    Status On-going   12/21/20- reporting decreasing heel pain, pain in great toe due to callouses     PT LONG TERM GOAL #5   Title Pt. will be assessed for fall risk with appropriate goal TBD    Time 6    Period Weeks    Status New                   Plan - 12/29/20 1446     Clinical Impression Statement Pt still reports pain along her R big toe on the weight bearing surface, for which she is going back to her podiatrist.  Incorporated hip strengthening exercises in supine for proximal stability for the ankles and c/o L knee pain. Ran out of time before we were able to update HEP with hip strengthening so will plan to do so next session. Ended with STM to decrease muscle tension and improve ankle flexibility.    Personal Factors and Comorbidities Comorbidity 3+    Comorbidities history of CVA, balance deficits, L knee pain, HTN    PT Frequency 2x / week    PT Duration 6 weeks    PT Treatment/Interventions ADLs/Self Care Home Management;Cryotherapy;Moist Heat;Stair training;Therapeutic activities;Gait training;Therapeutic exercise;Balance training;Neuromuscular re-education;Orthotic Fit/Training;Patient/family education;Manual techniques;Passive range of motion;Dry needling;Taping;Joint Manipulations    PT Next Visit Plan update hip strengthening HEP; assess balance, ankle/LE strengthening.    PT Home Exercise Plan Access Code: [email or web address]    Consulted and Agree with Plan of Care Patient  Patient will benefit from skilled therapeutic intervention in order to improve the following deficits and impairments:  Abnormal gait, Decreased activity tolerance, Decreased endurance,  Decreased range of motion, Decreased strength, Increased fascial restricitons, Decreased balance, Decreased mobility, Difficulty walking, Increased muscle spasms, Impaired flexibility, Pain  Visit Diagnosis: Pain in left ankle and joints of left foot  Pain in right ankle and joints of right foot  Other abnormalities of gait and mobility  Muscle weakness (generalized)     Problem List Patient Active Problem List   Diagnosis Date Noted   Ganglion cyst of volar aspect of left wrist 05/20/2020   Trigger thumb of left hand 05/20/2020   Labral tear of shoulder, degenerative, right 05/20/2020   Anxiety 02/05/2019   Oral thrush 10/08/2018   Bilateral leg pain 10/08/2018   Constipation 10/08/2018   Shortness of breath 10/08/2018   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  [place], [place], [address] Phone: [phone]   Fax:  [phone]  Name: Brittany [name] MRN: [id number] Date of Birth: 09/17/1965

## 2021-01-04 ENCOUNTER — Ambulatory Visit: Payer: Medicaid Other

## 2021-01-05 ENCOUNTER — Ambulatory Visit: Payer: Medicaid Other | Admitting: Physical Therapy

## 2021-01-06 ENCOUNTER — Other Ambulatory Visit: Payer: Self-pay

## 2021-01-06 ENCOUNTER — Ambulatory Visit (INDEPENDENT_AMBULATORY_CARE_PROVIDER_SITE_OTHER): Payer: Medicaid Other | Admitting: Nurse Practitioner

## 2021-01-06 ENCOUNTER — Encounter: Payer: Self-pay | Admitting: Nurse Practitioner

## 2021-01-06 VITALS — BP 135/65 | HR 82 | Temp 97.6°F | Ht 61.0 in | Wt 156.0 lb

## 2021-01-06 DIAGNOSIS — R7303 Prediabetes: Secondary | ICD-10-CM

## 2021-01-06 DIAGNOSIS — L84 Corns and callosities: Secondary | ICD-10-CM | POA: Diagnosis not present

## 2021-01-06 DIAGNOSIS — Z13220 Encounter for screening for lipoid disorders: Secondary | ICD-10-CM | POA: Diagnosis not present

## 2021-01-06 DIAGNOSIS — R609 Edema, unspecified: Secondary | ICD-10-CM | POA: Diagnosis not present

## 2021-01-06 DIAGNOSIS — I10 Essential (primary) hypertension: Secondary | ICD-10-CM | POA: Diagnosis not present

## 2021-01-06 LAB — POCT GLYCOSYLATED HEMOGLOBIN (HGB A1C)
HbA1c POC (<> result, manual entry): 5.9 % (ref 4.0–5.6)
HbA1c, POC (controlled diabetic range): 5.9 % (ref 0.0–7.0)
HbA1c, POC (prediabetic range): 5.9 % (ref 5.7–6.4)
Hemoglobin A1C: 5.9 % — AB (ref 4.0–5.6)

## 2021-01-06 MED ORDER — OMEPRAZOLE 20 MG PO CPDR: 20.0000 mg | DELAYED_RELEASE_CAPSULE | Freq: Two times a day (BID) | ORAL | 3 refills | Status: DC

## 2021-01-06 MED ORDER — AMLODIPINE BESYLATE 10 MG PO TABS: 10.0000 mg | ORAL_TABLET | Freq: Every day | ORAL | 3 refills | Status: DC

## 2021-01-06 MED ORDER — TRIAMCINOLONE ACETONIDE 0.1 % EX CREA: 1.0000 "application " | TOPICAL_CREAM | Freq: Two times a day (BID) | CUTANEOUS | 2 refills | Status: DC

## 2021-01-06 NOTE — Patient Instructions (Signed)
Managing Your Hypertension Hypertension, also called high blood pressure, is when the force of the blood pressing against the walls of the arteries is too strong. Arteries are blood vessels that carry blood from your heart throughout your body. Hypertension forces the heart to work harder to pump blood and may cause the arteries to become narrow or stiff. Understanding blood pressure readings Your personal target blood pressure may vary depending on your medical conditions, your age, and other factors. A blood pressure reading includes a higher number over a lower number. Ideally, your blood pressure should be below 120/80. You should know that: The first, or top, number is called the systolic pressure. It is a measure of the pressure in your arteries as your heart beats. The second, or bottom number, is called the diastolic pressure. It is a measure of the pressure in your arteries as the heart relaxes. Blood pressure is classified into four stages. Based on your blood pressure reading, your health care provider may use the following stages to determine what type of treatment you need, if any. Systolic pressure and diastolic pressure are measured in a unit called mmHg. Normal Systolic pressure: below 120. Diastolic pressure: below 80. Elevated Systolic pressure: 120-129. Diastolic pressure: below 80. Hypertension stage 1 Systolic pressure: 130-139. Diastolic pressure: 80-89. Hypertension stage 2 Systolic pressure: 140 or above. Diastolic pressure: 90 or above. How can this condition affect me? Managing your hypertension is an important responsibility. Over time, hypertension can damage the arteries and decrease blood flow to important parts of the body, including the brain, heart, and kidneys. Having untreated or uncontrolled hypertension can lead to: A heart attack. A stroke. A weakened blood vessel (aneurysm). Heart failure. Kidney damage. Eye damage. Metabolic syndrome. Memory and  concentration problems. Vascular dementia. What actions can I take to manage this condition? Hypertension can be managed by making lifestyle changes and possibly by taking medicines. Your health care provider will help you make a plan to bring your blood pressure within a normal range. Nutrition  Eat a diet that is high in fiber and potassium, and low in salt (sodium), added sugar, and fat. An example eating plan is called the Dietary Approaches to Stop Hypertension (DASH) diet. To eat this way: Eat plenty of fresh fruits and vegetables. Try to fill one-half of your plate at each meal with fruits and vegetables. Eat whole grains, such as whole-wheat pasta, brown rice, or whole-grain bread. Fill about one-fourth of your plate with whole grains. Eat low-fat dairy products. Avoid fatty cuts of meat, processed or cured meats, and poultry with skin. Fill about one-fourth of your plate with lean proteins such as fish, chicken without skin, beans, eggs, and tofu. Avoid pre-made and processed foods. These tend to be higher in sodium, added sugar, and fat. Reduce your daily sodium intake. Most people with hypertension should eat less than 1,500 mg of sodium a day. Lifestyle  Work with your health care provider to maintain a healthy body weight or to lose weight. Ask what an ideal weight is for you. Get at least 30 minutes of exercise that causes your heart to beat faster (aerobic exercise) most days of the week. Activities may include walking, swimming, or biking. Include exercise to strengthen your muscles (resistance exercise), such as weight lifting, as part of your weekly exercise routine. Try to do these types of exercises for 30 minutes at least 3 days a week. Do not use any products that contain nicotine or tobacco, such as cigarettes, e-cigarettes,   and chewing tobacco. If you need help quitting, ask your health care provider. Control any long-term (chronic) conditions you have, such as high  cholesterol or diabetes. Identify your sources of stress and find ways to manage stress. This may include meditation, deep breathing, or making time for fun activities. Alcohol use Do not drink alcohol if: Your health care provider tells you not to drink. You are pregnant, may be pregnant, or are planning to become pregnant. If you drink alcohol: Limit how much you use to: 0-1 drink a day for women. 0-2 drinks a day for men. Be aware of how much alcohol is in your drink. In the [place] one drink equals one 12 oz bottle of beer (355 mL), one 5 oz glass of wine (148 mL), or one 1 oz glass of hard liquor (44 mL). Medicines Your health care provider may prescribe medicine if lifestyle changes are not enough to get your blood pressure under control and if: Your systolic blood pressure is 130 or higher. Your diastolic blood pressure is 80 or higher. Take medicines only as told by your health care provider. Follow the directions carefully. Blood pressure medicines must be taken as told by your health care provider. The medicine does not work as well when you skip doses. Skipping doses also puts you at risk for problems. Monitoring Before you monitor your blood pressure: Do not smoke, drink caffeinated beverages, or exercise within 30 minutes before taking a measurement. Use the bathroom and empty your bladder (urinate). Sit quietly for at least 5 minutes before taking measurements. Monitor your blood pressure at home as told by your health care provider. To do this: Sit with your back straight and supported. Place your feet flat on the floor. Do not cross your legs. Support your arm on a flat surface, such as a table. Make sure your upper arm is at heart level. Each time you measure, take two or three readings one minute apart and record the results. You may also need to have your blood pressure checked regularly by your health care provider. General information Talk with your health care  provider about your diet, exercise habits, and other lifestyle factors that may be contributing to hypertension. Review all the medicines you take with your health care provider because there may be side effects or interactions. Keep all visits as told by your health care provider. Your health care provider can help you create and adjust your plan for managing your high blood pressure. Where to find more information National Heart, Lung, and Blood Institute: www.nhlbi.nih.gov American Heart Association: www.heart.org Contact a health care provider if: You think you are having a reaction to medicines you have taken. You have repeated (recurrent) headaches. You feel dizzy. You have swelling in your ankles. You have trouble with your vision. Get help right away if: You develop a severe headache or confusion. You have unusual weakness or numbness, or you feel faint. You have severe pain in your chest or abdomen. You vomit repeatedly. You have trouble breathing. These symptoms may represent a serious problem that is an emergency. Do not wait to see if the symptoms will go away. Get medical help right away. Call your local emergency services (911 in the [place] Do not drive yourself to the hospital. Summary Hypertension is when the force of blood pumping through your arteries is too strong. If this condition is not controlled, it may put you at risk for serious complications. Your personal target blood pressure may vary depending on   your medical conditions, your age, and other factors. For most people, a normal blood pressure is less than 120/80. Hypertension is managed by lifestyle changes, medicines, or both. Lifestyle changes to help manage hypertension include losing weight, eating a healthy, low-sodium diet, exercising more, stopping smoking, and limiting alcohol. This information is not intended to replace advice given to you by your health care provider. Make sure you discuss any questions  you have with your health care provider. Document Revised: 04/11/2019 Document Reviewed: 02/04/2019 Elsevier Patient Education  2022 [place].  Prediabetes Eating Plan Prediabetes is a condition that causes blood sugar (glucose) levels to be higher than normal. This increases the risk for developing type 2 diabetes (type 2 diabetes mellitus). Working with a health care provider or nutrition specialist (dietitian) to make diet and lifestyle changes can help prevent the onset of diabetes. These changes may help you: Control your blood glucose levels. Improve your cholesterol levels. Manage your blood pressure. What are tips for following this plan? Reading food labels Read food labels to check the amount of fat, salt (sodium), and sugar in prepackaged foods. Avoid foods that have: Saturated fats. Trans fats. Added sugars. Avoid foods that have more than 300 milligrams (mg) of sodium per serving. Limit your sodium intake to less than 2,300 mg each day. Shopping Avoid buying pre-made and processed foods. Avoid buying drinks with added sugar. Cooking Cook with olive oil. Do not use butter, lard, or ghee. Bake, broil, grill, steam, or boil foods. Avoid frying. Meal planning  Work with your dietitian to create an eating plan that is right for you. This may include tracking how many calories you take in each day. Use a food diary, notebook, or mobile application to track what you eat at each meal. Consider following a Mediterranean diet. This includes: Eating several servings of fresh fruits and vegetables each day. Eating fish at least twice a week. Eating one serving each day of whole grains, beans, nuts, and seeds. Using olive oil instead of other fats. Limiting alcohol. Limiting red meat. Using nonfat or low-fat dairy products. Consider following a plant-based diet. This includes dietary choices that focus on eating mostly vegetables and fruit, grains, beans, nuts, and seeds. If  you have high blood pressure, you may need to limit your sodium intake or follow a diet such as the DASH (Dietary Approaches to Stop Hypertension) eating plan. The DASH diet aims to lower high blood pressure. Lifestyle Set weight loss goals with help from your health care team. It is recommended that most people with prediabetes lose 7% of their body weight. Exercise for at least 30 minutes 5 or more days a week. Attend a support group or seek support from a mental health counselor. Take over-the-counter and prescription medicines only as told by your health care provider. What foods are recommended? Fruits Berries. Bananas. Apples. Oranges. Grapes. Papaya. Mango. Pomegranate. Kiwi. Grapefruit. Cherries. Vegetables Lettuce. Spinach. Peas. Beets. Cauliflower. Cabbage. Broccoli. Carrots. Tomatoes. Squash. Eggplant. Herbs. Peppers. Onions. Cucumbers. Brussels sprouts. Grains Whole grains, such as whole-wheat or whole-grain breads, crackers, cereals, and pasta. Unsweetened oatmeal. Bulgur. Barley. Quinoa. Brown rice. Corn or whole-wheat flour tortillas or taco shells. Meats and other proteins Seafood. Poultry without skin. Lean cuts of pork and beef. Tofu. Eggs. Nuts. Beans. Dairy Low-fat or fat-free dairy products, such as yogurt, cottage cheese, and cheese. Beverages Water. Tea. Coffee. Sugar-free or diet soda. Seltzer water. Low-fat or nonfat milk. Milk alternatives, such as soy or almond milk. Fats and oils  Olive oil. Canola oil. Sunflower oil. Grapeseed oil. Avocado. Walnuts. Sweets and desserts Sugar-free or low-fat pudding. Sugar-free or low-fat ice cream and other frozen treats. Seasonings and condiments Herbs. Sodium-free spices. Mustard. Relish. Low-salt, low-sugar ketchup. Low-salt, low-sugar barbecue sauce. Low-fat or fat-free mayonnaise. The items listed above may not be a complete list of recommended foods and beverages. Contact a dietitian for more information. What foods are not  recommended? Fruits Fruits canned with syrup. Vegetables Canned vegetables. Frozen vegetables with butter or cream sauce. Grains Refined white flour and flour products, such as bread, pasta, snack foods, and cereals. Meats and other proteins Fatty cuts of meat. Poultry with skin. Breaded or fried meat. Processed meats. Dairy Full-fat yogurt, cheese, or milk. Beverages Sweetened drinks, such as iced tea and soda. Fats and oils Butter. Lard. Ghee. Sweets and desserts Baked goods, such as cake, cupcakes, pastries, cookies, and cheesecake. Seasonings and condiments Spice mixes with added salt. Ketchup. Barbecue sauce. Mayonnaise. The items listed above may not be a complete list of foods and beverages that are not recommended. Contact a dietitian for more information. Where to find more information American Diabetes Association: www.diabetes.org Summary You may need to make diet and lifestyle changes to help prevent the onset of diabetes. These changes can help you control blood sugar, improve cholesterol levels, and manage blood pressure. Set weight loss goals with help from your health care team. It is recommended that most people with prediabetes lose 7% of their body weight. Consider following a Mediterranean diet. This includes eating plenty of fresh fruits and vegetables, whole grains, beans, nuts, seeds, fish, and low-fat dairy, and using olive oil instead of other fats. This information is not intended to replace advice given to you by your health care provider. Make sure you discuss any questions you have with your health care provider. Document Revised: 06/05/2019 Document Reviewed: 06/05/2019 Elsevier Patient Education  [place].

## 2021-01-06 NOTE — Progress Notes (Signed)
[place], [place]  [address] Phone:  [phone]   Fax:  [phone]   Established Patient Office Visit  Subjective:  Patient ID: Brittany [name], female    DOB: 1965-09-25  Age: 55 y.o. MRN: [id number]  CC:  Chief Complaint  Patient presents with   Follow-up    Follow up;Hypertension and prediabetes Pt states that her IBS is flaring up again. Pt also states that she has calluses on both big toes and on her left hand.      HPI Brittany [name] presents for follow up. She  has a past medical history of Anemia, Arthritis, Complication of anesthesia, Elevated cholesterol, Female bladder prolapse, GERD (gastroesophageal reflux disease), Heart murmur, Hiatal hernia, Hypertension, IBS (irritable bowel syndrome), Multiple myeloma ([place]) (2005), Peripheral neuropathy, SOB (shortness of breath) on exertion, and Stroke ([place]) (2017).   She was seen to follow up for her HTN and prediabetes. She has been compliant with her medications. She has had a 6 pound weight loss. Denies headache, dizziness, visual changes, shortness of breath, dyspnea on exertion, chest pain, nausea, vomiting or any edema.  Her biggest concern today is her left hand and feet callus. She has been seen by podiatry and reports removal along with home removal. However the calluses continue to return. She is concern that it maybe a wart.   Past Medical History:  Diagnosis Date   Anemia    Arthritis    trigger finger in left hand   Complication of anesthesia    per pt, hard to wake up!   Elevated cholesterol    Female bladder prolapse    per urologist, does not have prolaspe   GERD (gastroesophageal reflux disease)    Heart murmur    pt unsure.   Hiatal hernia    Hypertension    IBS (irritable bowel syndrome)    Multiple myeloma ([place]) 2005   had partial chemo   Peripheral neuropathy    SOB (shortness of breath) on exertion    uses an inhaler   Stroke [facility]) 2017    paralysis left arm/uses a walker    Past Surgical History:  Procedure Laterality Date   BONE BIOPSY  2005   in her back   COLONOSCOPY     over 10 years x3   ESOPHAGOGASTRODUODENOSCOPY     incomplete-over 10 years ago    TEE WITHOUT CARDIOVERSION N/A 12/08/2016   Procedure: TRANSESOPHAGEAL ECHOCARDIOGRAM (TEE);  Surgeon: Sueanne Margarita, MD;  Location: [facility] ENDOSCOPY;  Service: Cardiovascular;  Laterality: N/A;   TUBAL LIGATION     UPPER GASTROINTESTINAL ENDOSCOPY     WISDOM TOOTH EXTRACTION      Family History  Problem Relation Age of Onset   Hypertension Mother    Sarcoidosis Mother        currently in remission    Diverticulitis Mother    Irritable bowel syndrome Mother    Liver cancer Mother    Hypertension Father    Stomach cancer Father    Congestive Heart Failure Father    Stroke Maternal Uncle    Scoliosis Brother    Colon cancer Neg Hx    Esophageal cancer Neg Hx    Colon polyps Neg Hx    Rectal cancer Neg Hx     Social History   Socioeconomic History   Marital status: Legally Separated    Spouse name: Not on file   Number of children: Not on file   Years  of education: Not on file   Highest education level: Not on file  Occupational History   Occupation: Disabled  Tobacco Use   Smoking status: Former    Types: Cigarettes    Quit date: 12/06/2015    Years since quitting: 5.0   Smokeless tobacco: Never  Vaping Use   Vaping Use: Never used  Substance and Sexual Activity   Alcohol use: Not Currently   Drug use: Not Currently    Types: Marijuana    Comment: in the past    Sexual activity: Not Currently  Other Topics Concern   Not on file  Social History Narrative   Not on file   Social Determinants of Health   Financial Resource Strain: Not on file  Food Insecurity: Food Insecurity Present   Worried About [place] in the Last Year: Sometimes true   [place] in the Last Year: Sometimes true  Transportation Needs: No  Transportation Needs   Lack of Transportation (Medical): No   Lack of Transportation (Non-Medical): No  Physical Activity: Not on file  Stress: Not on file  Social Connections: Not on file  Intimate Partner Violence: Not on file    Outpatient Medications Prior to Visit  Medication Sig Dispense Refill   albuterol (VENTOLIN HFA) 108 (90 Base) MCG/ACT inhaler Inhale 2 puffs into the lungs every 4 (four) hours as needed for wheezing or shortness of breath. 8.5 g 11   aspirin 325 MG tablet Take 1 tablet (325 mg total) by mouth daily. 30 tablet 0   atorvastatin (LIPITOR) 40 MG tablet Take 1 tablet (40 mg total) by mouth daily. 90 tablet 3   Blood Pressure Monitor KIT 1 kit by Does not apply route daily. 1 kit 0   ciclopirox (PENLAC) 8 % solution Apply topically at bedtime. Apply over nail and surrounding skin. Apply daily over previous coat. After seven (7) days, may remove with alcohol and continue cycle. 6.6 mL 0   cyanocobalamin 1000 MCG tablet Take 1 tablet (1,000 mcg total) by mouth daily. 30 tablet 4   dimenhyDRINATE (DRAMAMINE) 50 MG tablet Take 50 mg by mouth every 8 (eight) hours as needed for dizziness.     docusate sodium (COLACE) 100 MG capsule Take 200 mg by mouth 2 (two) times daily.     Ergocalciferol (VITAMIN D2) 10 MCG (400 UNIT) TABS Take 10 mcg by mouth daily. 30 tablet 6   furosemide (LASIX) 20 MG tablet Take 1 tablet (20 mg total) by mouth 2 (two) times daily as needed. 60 tablet 3   gabapentin (NEURONTIN) 300 MG capsule Take 1 capsule (300 mg total) by mouth 3 (three) times daily. 90 capsule 3   hydrOXYzine (VISTARIL) 50 MG capsule Take 1-2 capsules (50-100 mg total) by mouth 3 (three) times daily as needed for itching. 90 capsule 3   methocarbamol (ROBAXIN) 500 MG tablet Take 1 tablet (500 mg total) by mouth 3 (three) times daily. 90 tablet 3   Multiple Vitamin (MULTIVITAMIN) capsule Take 4 capsules by mouth 2 (two) times daily. Centrum plus     senna (SENOKOT) 8.6 MG TABS  tablet Take 2 tablets (17.2 mg total) by mouth 2 (two) times daily. 360 tablet 3   amLODipine (NORVASC) 10 MG tablet Take 1 tablet (10 mg total) by mouth daily. 30 tablet 3   omeprazole (PRILOSEC) 20 MG capsule Take 1 capsule (20 mg total) by mouth 2 (two) times daily before a meal. 60 capsule 3  triamcinolone cream (KENALOG) 0.1 % Apply 1 application topically 2 (two) times daily. 454 g 0   cloNIDine (CATAPRES) 0.2 MG tablet Take by mouth. (Patient not taking: No sig reported)     No facility-administered medications prior to visit.    Allergies  Allergen Reactions   Albuterol Swelling and Palpitations   Amitriptyline Other (See Comments)    Patient reported that it made her throat feel like its locking up and it also caused issues with her going to the bathroom   Duloxetine Other (See Comments)    Patient reported that it made her throat lock up and it caused her to have issue with going to the bathroom   Naproxen     Vomiting, sweating, abd spasms   Other     States can't take pain meds that end in "cet" or meds that end in "ine" Darvocet/severe vomiting   Darvon [Propoxyphene] Nausea And Vomiting   Hydrocodone Nausea And Vomiting   Lactose Intolerance (Gi)     Bloating, gas, abd pain   Latex Itching and Rash   Oxycodone Nausea And Vomiting   Percocet [Oxycodone-Acetaminophen] Nausea And Vomiting   Topamax [Topiramate]     Memory made her emotional     ROS Review of Systems  Genitourinary:        Incontinence urinary       Objective:    Physical Exam Vitals and nursing note reviewed.  Constitutional:      Appearance: Normal appearance.  HENT:     Head: Normocephalic and atraumatic.     Nose: Nose normal.     Mouth/Throat:     Mouth: Mucous membranes are moist.     Pharynx: Oropharynx is clear.  Cardiovascular:     Rate and Rhythm: Normal rate and regular rhythm.     Pulses: Normal pulses.     Heart sounds: Normal heart sounds.  Pulmonary:     Effort:  Pulmonary effort is normal.     Breath sounds: Normal breath sounds.  Abdominal:     General: Bowel sounds are normal.     Palpations: Abdomen is soft.  Musculoskeletal:        General: Normal range of motion.     Cervical back: Normal range of motion and neck supple.  Skin:    General: Skin is warm and dry.     Comments: Thickness of skin to left hand lateral aspect   Neurological:     Mental Status: She is alert.     Motor: Weakness present.     Gait: Gait abnormal.     Comments: Rollator  Psychiatric:        Mood and Affect: Mood normal.        Behavior: Behavior normal.        Thought Content: Thought content normal.        Judgment: Judgment normal.    BP 135/65   Pulse 82   Temp 97.6 F (36.4 C)   Ht 5' 1"  (1.549 m)   Wt 156 lb (70.8 kg)   SpO2 100%   BMI 29.48 kg/m  Wt Readings from Last 3 Encounters:  01/06/21 156 lb (70.8 kg)  10/06/20 142 lb 0.8 oz (64.4 kg)  07/06/20 136 lb (61.7 kg)     Health Maintenance Due  Topic Date Due   PAP SMEAR-Modifier  Never done   Zoster Vaccines- Shingrix (1 of 2) Never done    There are no preventive care reminders to display for this  patient.  Lab Results  Component Value Date   TSH 2.740 02/04/2019   Lab Results  Component Value Date   WBC 5.9 02/04/2019   HGB 14.5 02/04/2019   HCT 43.3 02/04/2019   MCV 84 02/04/2019   PLT 289 02/04/2019   Lab Results  Component Value Date   NA 140 02/04/2019   K 4.6 02/04/2019   CO2 22 02/04/2019   GLUCOSE 94 02/04/2019   BUN 8 02/04/2019   CREATININE 0.89 02/04/2019   BILITOT 0.4 02/04/2019   ALKPHOS 187 (H) 02/04/2019   AST 22 02/04/2019   ALT 15 02/04/2019   PROT 8.1 02/04/2019   ALBUMIN 5.0 (H) 02/04/2019   CALCIUM 10.6 (H) 02/04/2019   ANIONGAP 15 10/15/2018   Lab Results  Component Value Date   CHOL 190 02/04/2019   Lab Results  Component Value Date   HDL 41 02/04/2019   Lab Results  Component Value Date   LDLCALC 120 (H) 02/04/2019   Lab  Results  Component Value Date   TRIG 161 (H) 02/04/2019   Lab Results  Component Value Date   CHOLHDL 4.6 (H) 02/04/2019   Lab Results  Component Value Date   HGBA1C 5.9 (A) 01/06/2021   HGBA1C 5.9 01/06/2021   HGBA1C 5.9 01/06/2021   HGBA1C 5.9 01/06/2021      Assessment & Plan:   Problem List Items Addressed This Visit       Cardiovascular and Mediastinum   Hypertension Stable  Encouraged on going compliance with current medication regimen Encouraged home monitoring and recording BP <130/80 Eating a heart-healthy diet with less salt Encouraged regular physical activity  Recommend Weight loss     Relevant Medications   amLODipine (NORVASC) 10 MG tablet   Other Relevant Orders   Comp. Metabolic Panel (12) (Completed)   Other Visit Diagnoses     Prediabetes    -  Primary Stable  Consider home glucose monitoring Weight loss at least 5% of current body weight is can be achieved with lifestyle modification dietary changes and regular daily exercise Encourage blood pressure control goal <120/80 and maintaining total cholesterol <200 Follow-up every 3 to 6 months for reevaluation Education material provided    Relevant Orders   HgB A1c (Completed)   Comp. Metabolic Panel (12) (Completed)   Peripheral edema     resolved   Screening for cholesterol level       Relevant Orders   Lipid panel (Completed)   Callus of palm of hand    Recurrent to hand and feet Will have podiatry culture for evaluation of wart       Meds ordered this encounter  Medications   triamcinolone cream (KENALOG) 0.1 %    Sig: Apply 1 application topically 2 (two) times daily.    Dispense:  454 g    Refill:  2   amLODipine (NORVASC) 10 MG tablet    Sig: Take 1 tablet (10 mg total) by mouth daily.    Dispense:  90 tablet    Refill:  3    Order Specific Question:   Supervising Provider    Answer:   Tresa Garter [[id number]]   omeprazole (PRILOSEC) 20 MG capsule    Sig: Take 1  capsule (20 mg total) by mouth 2 (two) times daily before a meal.    Dispense:  180 capsule    Refill:  3    Order Specific Question:   Supervising Provider    Answer:   Tresa Garter [  [id number]]    Follow-up: Return in about 6 months (around 07/07/2021).    Vevelyn Francois, NP

## 2021-01-07 LAB — COMP. METABOLIC PANEL (12)
AST: 19 IU/L (ref 0–40)
Albumin/Globulin Ratio: 2 (ref 1.2–2.2)
Albumin: 4.7 g/dL (ref 3.8–4.9)
Alkaline Phosphatase: 158 IU/L — ABNORMAL HIGH (ref 44–121)
BUN/Creatinine Ratio: 8 — ABNORMAL LOW (ref 9–23)
BUN: 6 mg/dL (ref 6–24)
Bilirubin Total: 0.4 mg/dL (ref 0.0–1.2)
Calcium: 9.3 mg/dL (ref 8.7–10.2)
Chloride: 108 mmol/L — ABNORMAL HIGH (ref 96–106)
Creatinine, Ser: 0.8 mg/dL (ref 0.57–1.00)
Globulin, Total: 2.4 g/dL (ref 1.5–4.5)
Glucose: 87 mg/dL (ref 70–99)
Potassium: 4.6 mmol/L (ref 3.5–5.2)
Sodium: 145 mmol/L — ABNORMAL HIGH (ref 134–144)
Total Protein: 7.1 g/dL (ref 6.0–8.5)
eGFR: 87 mL/min/{1.73_m2} (ref >59)

## 2021-01-07 LAB — LIPID PANEL
Chol/HDL Ratio: 3.7 ratio (ref 0.0–4.4)
Cholesterol, Total: 144 mg/dL (ref 100–199)
HDL: 39 mg/dL — ABNORMAL LOW (ref >39)
LDL Chol Calc (NIH): 81 mg/dL (ref 0–99)
Triglycerides: 136 mg/dL (ref 0–149)
VLDL Cholesterol Cal: 24 mg/dL (ref 5–40)

## 2021-01-08 ENCOUNTER — Encounter: Payer: Self-pay | Admitting: Nurse Practitioner

## 2021-01-10 ENCOUNTER — Ambulatory Visit: Payer: Self-pay

## 2021-01-11 ENCOUNTER — Ambulatory Visit: Payer: Medicaid Other

## 2021-01-11 ENCOUNTER — Other Ambulatory Visit: Payer: Self-pay

## 2021-01-11 DIAGNOSIS — M25572 Pain in left ankle and joints of left foot: Secondary | ICD-10-CM

## 2021-01-11 DIAGNOSIS — R2689 Other abnormalities of gait and mobility: Secondary | ICD-10-CM

## 2021-01-11 DIAGNOSIS — M25571 Pain in right ankle and joints of right foot: Secondary | ICD-10-CM

## 2021-01-11 DIAGNOSIS — M6281 Muscle weakness (generalized): Secondary | ICD-10-CM

## 2021-01-11 NOTE — Therapy (Addendum)
[facility] [address]  [place], [place], [address] Phone: [phone]   Fax:  [phone]  Physical Therapy Treatment/Progress Note  Progress Note Reporting Period 12/07/2020 to 01/11/2021  See note below for Objective Data and Assessment of Progress/Goals.   Patient is making good progress towards goals, but due to mobility issues has not been able to attend all visits in her plan of care so extending additional 6 weeks until 02/22/2021 to fully address both feet pain and balance deficits.    Patient Details  Name: Brittany [name] MRN: [id number] Date of Birth: [birth year] Referring Provider (PT): Celesta Gentile DPM   Encounter Date: 01/11/2021   PT End of Session - 01/11/21 1445     Visit Number 7    Number of Visits 12    Date for PT Re-Evaluation 01/18/21    Authorization Type [employer] Medicaid    Progress Note Due on Visit 10    PT Start Time 1400    PT Stop Time 1441    PT Time Calculation (min) 41 min    Activity Tolerance Patient tolerated treatment well    Behavior During Therapy WFL for tasks assessed/performed             Past Medical History:  Diagnosis Date   Anemia    Arthritis    trigger finger in left hand   Complication of anesthesia    per pt, hard to wake up!   Elevated cholesterol    Female bladder prolapse    per urologist, does not have prolaspe   GERD (gastroesophageal reflux disease)    Heart murmur    pt unsure.   Hiatal hernia    Hypertension    IBS (irritable bowel syndrome)    Multiple myeloma ([place]) 2005   had partial chemo   Peripheral neuropathy    SOB (shortness of breath) on exertion    uses an inhaler   Stroke [facility] 2017   paralysis left arm/uses a walker    Past Surgical History:  Procedure Laterality Date   BONE BIOPSY  2005   in her back   COLONOSCOPY     over 10 years x3   ESOPHAGOGASTRODUODENOSCOPY     incomplete-over 10 years ago    TEE WITHOUT  CARDIOVERSION N/A 12/08/2016   Procedure: TRANSESOPHAGEAL ECHOCARDIOGRAM (TEE);  Surgeon: Sueanne Margarita, MD;  Location: [facility] ENDOSCOPY;  Service: Cardiovascular;  Laterality: N/A;   TUBAL LIGATION     UPPER GASTROINTESTINAL ENDOSCOPY     WISDOM TOOTH EXTRACTION      There were no vitals filed for this visit.   Subjective Assessment - 01/11/21 1403     Subjective Pt reports that her toes have been hurting still, L knee is having a good day today. Has an appointment with her foot doctor on the 30th.    Pertinent History CVA    Diagnostic tests X-rays of heels -  No evidence of acute fracture or stress fracture identified.    Patient Stated Goals "leg strength and walk better"    Currently in Pain? Yes    Pain Score 8     Pain Location Foot    Pain Orientation Right;Left    Pain Descriptors / Indicators Throbbing                [facility] PT Assessment - 01/11/21 0001       ROM / Strength   AROM / PROM / Strength Strength  AROM   Right Ankle Dorsiflexion 5    Right Ankle Plantar Flexion 53    Right Ankle Inversion 38    Right Ankle Eversion 32    Left Ankle Dorsiflexion 6    Left Ankle Plantar Flexion 70    Left Ankle Inversion 31    Left Ankle Eversion 21      Strength   Right Hip Flexion 4-/5    Right Hip ABduction 4+/5    Right Hip ADduction 4-/5    Left Hip Flexion 4-/5    Left Hip ABduction 4/5    Left Hip ADduction 4-/5    Right Knee Flexion 4+/5    Right Knee Extension 4+/5    Left Knee Flexion 4/5    Left Knee Extension 4/5    Right Ankle Dorsiflexion 4+/5    Right Ankle Plantar Flexion 4+/5    Left Ankle Dorsiflexion 4+/5    Left Ankle Plantar Flexion 4+/5                           [facility] Adult PT Treatment/Exercise - 01/11/21 0001       Knee/Hip Exercises: Aerobic   Nustep L4x60mn      Knee/Hip Exercises: Seated   Long Arc Quad AROM;Both;10 reps    Ball Squeeze 10x3"    Marching Strengthening;Both;10 reps    Marching  Limitations red TB                     PT Education - 01/11/21 1444     Education Details HEP update and review with patient    Person(s) Educated Patient    Methods Explanation;Demonstration;Handout;Verbal cues    Comprehension Verbalized understanding;Returned demonstration;Verbal cues required              PT Short Term Goals - 12/13/20 1819       PT SHORT TERM GOAL #1   Title Patient to be independent with initial HEP.    Baseline initial HEP given    Time 2    Period Weeks    Status Achieved   12/13/20- reports compliance.   Target Date 12/21/20               PT Long Term Goals - 01/11/21 1413       PT LONG TERM GOAL #1   Title Patient to be independent with advanced HEP.    Time 6    Period Weeks    Status On-going    Target Date 02/22/21      PT LONG TERM GOAL #2   Title Pt. will demonstrate 4+/5 bil LE strength throughout for safety with gait.    Baseline 4/5 hip strength    Time 6    Period Weeks    Status On-going   01/11/21; global weakness in hips and knees below 4+/5   Target Date 02/22/21      PT LONG TERM GOAL #3   Title Pt. will demonstrate at least 10 deg bil ankle DF for improve gait mechanics.    Baseline 0 deg ankle DF bil, gastroc and soleus tightness.    Time 6    Period Weeks    Status On-going   01/11/21- improvements made in B ankle DF   Target Date 02/22/21      PT LONG TERM GOAL #4   Title Patient to report tolerance for 30 minutes on her feet without pain limiting.    Baseline  increased bil heel pain with standing walking +10 min    Time 6    Period Weeks    Status On-going   01/11/21- limited to 5 min due to heel and callous pain   Target Date 02/22/21      PT LONG TERM GOAL #5   Title Pt. will be assessed for fall risk with appropriate goal TBD    Time 6    Period Weeks    Status On-going    Target Date 02/22/21                   Plan - 01/11/21 1446     Clinical Impression Statement Pt  has made improvements in B ankle DF but still showing some stiffness. She demonstrates general hip and knee weakness which could be contributing to her heel pain. She is limited in standing tolerance to ~5 min before requiring seated breaks and having increased foot pain from callouses. She demonstrates good B ankle strength, increased ROM but still limited by proximal LE weakness and instability. Provided HEP updates for hip strengthening based on strength test today. She has made progress toward her goals, would benefit from continued PT to improve gait tolerance and to decrease B foot pain to improve overall function or mobility.    Personal Factors and Comorbidities Comorbidity 3+    Comorbidities history of CVA, balance deficits, L knee pain, HTN    PT Frequency 2x / week    PT Duration 6 weeks    PT Treatment/Interventions ADLs/Self Care Home Management;Cryotherapy;Moist Heat;Stair training;Therapeutic activities;Gait training;Therapeutic exercise;Balance training;Neuromuscular re-education;Orthotic Fit/Training;Patient/family education;Manual techniques;Passive range of motion;Dry needling;Taping;Joint Manipulations    PT Next Visit Plan update hip strengthening HEP; assess balance, ankle/LE strengthening.    PT Home Exercise Plan Access Code: [email or web address]    Consulted and Agree with Plan of Care Patient             Patient will benefit from skilled therapeutic intervention in order to improve the following deficits and impairments:  Abnormal gait, Decreased activity tolerance, Decreased endurance, Decreased range of motion, Decreased strength, Increased fascial restricitons, Decreased balance, Decreased mobility, Difficulty walking, Increased muscle spasms, Impaired flexibility, Pain  Visit Diagnosis: Pain in left ankle and joints of left foot  Pain in right ankle and joints of right foot  Other abnormalities of gait and mobility  Muscle weakness (generalized)     Problem  List Patient Active Problem List   Diagnosis Date Noted   Ganglion cyst of volar aspect of left wrist 05/20/2020   Trigger thumb of left hand 05/20/2020   Labral tear of shoulder, degenerative, right 05/20/2020   Anxiety 02/05/2019   Oral thrush 10/08/2018   Bilateral leg pain 10/08/2018   Constipation 10/08/2018   Shortness of breath 10/08/2018   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   B12 deficiency 12/19/2016   Chest pain 12/18/2016   Cerebellar infarct ([place])    Diastolic dysfunction    Marijuana abuse    History of tobacco abuse    Benign essential HTN    Tachycardia    Hypokalemia    Stroke (cerebrum) ([place]) 12/05/2016   Nonintractable headache 12/05/2016   Occipital stroke ([place]) 12/05/2016   Hypertensive urgency 12/05/2016   Hypertension     Artist Pais, PTA 01/11/2021, 6:43 PM  [facility] [address]  [place], [place], [address] Phone: [phone]   Fax:  [phone]  Name: Brittany [name] MRN:  [id number] Date of Birth: [birth year]  Patient is making good progress towards goals, but due to mobility issues has not been able to attend all visits in her plan of care so extending additional 6 weeks until 02/22/2021 to fully address both feet pain and balance deficits.   Rennie Natter, PT, DPT

## 2021-01-11 NOTE — Addendum Note (Signed)
Addended by: Rennie Natter on: 01/11/2021 06:45 PM   Modules accepted: Orders

## 2021-01-17 ENCOUNTER — Encounter: Payer: Self-pay | Admitting: Podiatry

## 2021-01-17 ENCOUNTER — Ambulatory Visit (INDEPENDENT_AMBULATORY_CARE_PROVIDER_SITE_OTHER): Payer: Medicaid Other | Admitting: Podiatry

## 2021-01-17 ENCOUNTER — Other Ambulatory Visit: Payer: Self-pay

## 2021-01-17 DIAGNOSIS — M79674 Pain in right toe(s): Secondary | ICD-10-CM | POA: Diagnosis not present

## 2021-01-17 DIAGNOSIS — M79675 Pain in left toe(s): Secondary | ICD-10-CM | POA: Diagnosis not present

## 2021-01-17 DIAGNOSIS — M722 Plantar fascial fibromatosis: Secondary | ICD-10-CM

## 2021-01-17 DIAGNOSIS — B351 Tinea unguium: Secondary | ICD-10-CM

## 2021-01-17 MED ORDER — CICLOPIROX 8 % EX SOLN: Freq: Every day | CUTANEOUS | 2 refills | Status: DC

## 2021-01-17 MED ORDER — AMMONIUM LACTATE 12 % EX LOTN: 1.0000 "application " | TOPICAL_LOTION | CUTANEOUS | 2 refills | Status: DC | PRN

## 2021-01-19 ENCOUNTER — Encounter: Payer: Self-pay | Admitting: Physical Therapy

## 2021-01-19 ENCOUNTER — Ambulatory Visit: Payer: Medicaid Other | Attending: Podiatry | Admitting: Physical Therapy

## 2021-01-19 ENCOUNTER — Other Ambulatory Visit: Payer: Self-pay

## 2021-01-19 DIAGNOSIS — M25572 Pain in left ankle and joints of left foot: Secondary | ICD-10-CM | POA: Insufficient documentation

## 2021-01-19 DIAGNOSIS — M6281 Muscle weakness (generalized): Secondary | ICD-10-CM | POA: Insufficient documentation

## 2021-01-19 DIAGNOSIS — R2689 Other abnormalities of gait and mobility: Secondary | ICD-10-CM | POA: Diagnosis present

## 2021-01-19 DIAGNOSIS — R262 Difficulty in walking, not elsewhere classified: Secondary | ICD-10-CM | POA: Insufficient documentation

## 2021-01-19 DIAGNOSIS — M25571 Pain in right ankle and joints of right foot: Secondary | ICD-10-CM | POA: Diagnosis present

## 2021-01-19 NOTE — Therapy (Addendum)
[facility] [address]  [place], [place], [address] Phone: [phone]   Fax:  [phone]  Physical Therapy Treatment  Patient Details  Name: Brittany [name] MRN: [id number] Date of Birth: 1965/05/14 Referring Provider (PT): Celesta Gentile DPM   Encounter Date: 01/19/2021   PT End of Session - 01/19/21 0947     Visit Number 8    Number of Visits 12    Date for PT Re-Evaluation 02/22/21    Authorization Type U[employer] Medicaid    Progress Note Due on Visit 17    PT Start Time [phone]    PT Stop Time 1028    PT Time Calculation (min) 46 min    Activity Tolerance Patient tolerated treatment well    Behavior During Therapy [facility] for tasks assessed/performed             Past Medical History:  Diagnosis Date   Anemia    Arthritis    trigger finger in left hand   Complication of anesthesia    per pt, hard to wake up!   Elevated cholesterol    Female bladder prolapse    per urologist, does not have prolaspe   GERD (gastroesophageal reflux disease)    Heart murmur    pt unsure.   Hiatal hernia    Hypertension    IBS (irritable bowel syndrome)    Multiple myeloma ([place]) 2005   had partial chemo   Peripheral neuropathy    SOB (shortness of breath) on exertion    uses an inhaler   Stroke [facility]) 2017   paralysis left arm/uses a walker    Past Surgical History:  Procedure Laterality Date   BONE BIOPSY  2005   in her back   COLONOSCOPY     over 10 years x3   ESOPHAGOGASTRODUODENOSCOPY     incomplete-over 10 years ago    TEE WITHOUT CARDIOVERSION N/A 12/08/2016   Procedure: TRANSESOPHAGEAL ECHOCARDIOGRAM (TEE);  Surgeon: Sueanne Margarita, MD;  Location: [facility] ENDOSCOPY;  Service: Cardiovascular;  Laterality: N/A;   TUBAL LIGATION     UPPER GASTROINTESTINAL ENDOSCOPY     WISDOM TOOTH EXTRACTION      There were no vitals filed for this visit.   Subjective Assessment - 01/19/21 0944     Subjective Patient  reports her ankles are doing much better, she had the callouses removed yesterday and took pain medication prior to her appointment.    Pertinent History CVA    Diagnostic tests X-rays of heels -  No evidence of acute fracture or stress fracture identified.    Patient Stated Goals "leg strength and walk better"    Currently in Pain? No/denies                [facility] PT Assessment - 01/19/21 0001       Transfers   Five time sit to stand comments  1 minute, hand on thighs, very unsteady      Balance   Balance Assessed Yes      Standardized Balance Assessment   Standardized Balance Assessment Berg Balance Test      Berg Balance Test   Sit to Stand Able to stand  independently using hands    Standing Unsupported Able to stand 30 seconds unsupported   1 min   Sitting with Back Unsupported but Feet Supported on Floor or Stool Able to sit safely and securely 2 minutes    Stand to Sit Controls descent by  using hands    Transfers Able to transfer with verbal cueing and /or supervision    Standing Unsupported with Eyes Closed Able to stand 10 seconds with supervision   no increase in sway with eyes closed, able to maintain for 30 sec, but generally unsteady with/without eyes closed.   Standing Unsupported with Feet Together Able to place feet together independently and stand for 1 minute with supervision    From Standing, Reach Forward with Outstretched Arm Reaches forward but needs supervision    From Standing Position, Pick up Object from [place] to pick up shoe, needs supervision    From Standing Position, Turn to Look Behind Over each Shoulder Needs supervision when turning    Turn 360 Degrees Needs close supervision or verbal cueing    Standing Unsupported, Alternately Place Feet on Step/Stool Needs assistance to keep from falling or unable to try   able to step with L foot to stool only   Standing Unsupported, One Foot in [place] help to step but can hold 15 seconds    Standing  on One Leg Tries to lift leg/unable to hold 3 seconds but remains standing independently    Total Score 28    Berg comment: high risk of falls                           [facility] Adult PT Treatment/Exercise - 01/19/21 0001       Knee/Hip Exercises: Aerobic   Nustep L4x81mn      Knee/Hip Exercises: Seated   Long Arc Quad Strengthening;Both;5 reps    Long Arc Quad Limitations added to HEP    Other Seated Knee/Hip Exercises seated quad sets x 5 sec hold, added to HEP    Sit to [employer]5 reps   1 minute needed, added to HEP     Knee/Hip Exercises: Supine   Quad Sets Strengthening;Both;5 reps    Quad Sets Limitations 5 sec hold, added to [employer]10 reps    Straight Leg Raises Limitations unable                     PT Education - 01/19/21 1158     Education Details HEP update    Person(s) Educated Patient    Methods Explanation;Demonstration;Verbal cues;Handout    Comprehension Verbalized understanding;Returned demonstration              PT Short Term Goals - 12/13/20 1819       PT SHORT TERM GOAL #1   Title Patient to be independent with initial HEP.    Baseline initial HEP given    Time 2    Period Weeks    Status Achieved   12/13/20- reports compliance.   Target Date 12/21/20               PT Long Term Goals - 01/19/21 1033       PT LONG TERM GOAL #1   Title Patient to be independent with advanced HEP.    Time 6    Period Weeks    Status On-going   01/19/21- compliant with current, progressed   Target Date 02/22/21      PT LONG TERM GOAL #2   Title Pt. will demonstrate 4+/5 bil LE strength throughout for safety with gait.    Baseline 4/5 hip strength    Time 6    Period Weeks    Status On-going  01/11/21; global weakness in hips and knees below 4+/5     PT LONG TERM GOAL #3   Title Pt. will demonstrate at least 10 deg bil ankle DF for improve gait mechanics.    Baseline 0 deg ankle DF bil, gastroc and soleus tightness.     Time 6    Period Weeks    Status On-going   01/11/21- improvements made in B ankle DF     PT LONG TERM GOAL #4   Title Patient to report tolerance for 30 minutes on her feet without pain limiting.    Baseline increased bil heel pain with standing walking +10 min    Time 6    Period Weeks    Status On-going   01/11/21- limited to 5 min due to heel and callous pain     PT LONG TERM GOAL #5   Title Pt. will demonstrate improved functional LE strength by improving 5x STS to <45 sec to decrease fall risk.    Baseline 1 minute    Time 6    Period Weeks    Status New      Additional Long Term Goals   Additional Long Term Goals Yes      PT LONG TERM GOAL #6   Title Patient will demonstrate decreased risk of falls by improving Berg Score to >35/56 to be safe not using walker indoors.    Baseline 28/56 increased risk for falls, indicates need for walker at all times, patient furniture surfs at home.    Time 6    Period Weeks    Status New    Target Date 02/22/21                   Plan - 01/19/21 1031     Clinical Impression Statement Patient reports significant improvement in pain and would like to start focusing on balance/hip strengthening.  Today she participated in balance testing - her 5x STS was 1 minute and her Berg Score was 28/56, both indicating high risk for falls.   Reviewed and progressed HEP for hip strengthening today to improve balance, also discussed standing at counter for longer periods to improve endurance, as fatigues after 1 minute of standing.  She would benefit from continued skilled therapy.    Personal Factors and Comorbidities Comorbidity 3+    Comorbidities history of CVA, balance deficits, L knee pain, HTN    PT Frequency 2x / week    PT Duration 6 weeks    PT Treatment/Interventions ADLs/Self Care Home Management;Cryotherapy;Moist Heat;Stair training;Therapeutic activities;Gait training;Therapeutic exercise;Balance training;Neuromuscular  re-education;Orthotic Fit/Training;Patient/family education;Manual techniques;Passive range of motion;Dry needling;Taping;Joint Manipulations    PT Next Visit Plan update hip strengthening HEP; assess balance, ankle/LE strengthening.    PT Home Exercise Plan Access Code: [email or web address]    Consulted and Agree with Plan of Care Patient             Patient will benefit from skilled therapeutic intervention in order to improve the following deficits and impairments:  Abnormal gait, Decreased activity tolerance, Decreased endurance, Decreased range of motion, Decreased strength, Increased fascial restricitons, Decreased balance, Decreased mobility, Difficulty walking, Increased muscle spasms, Impaired flexibility, Pain  Visit Diagnosis: Other abnormalities of gait and mobility  Muscle weakness (generalized)  Pain in left ankle and joints of left foot  Pain in right ankle and joints of right foot     Problem List Patient Active Problem List   Diagnosis Date Noted   Ganglion cyst of volar aspect  of left wrist 05/20/2020   Trigger thumb of left hand 05/20/2020   Labral tear of shoulder, degenerative, right 05/20/2020   Anxiety 02/05/2019   Oral thrush 10/08/2018   Bilateral leg pain 10/08/2018   Constipation 10/08/2018   Shortness of breath 10/08/2018   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   B12 deficiency 12/19/2016   Chest pain 12/18/2016   Cerebellar infarct ([place])    Diastolic dysfunction    Marijuana abuse    History of tobacco abuse    Benign essential HTN    Tachycardia    Hypokalemia    Stroke (cerebrum) ([place]) 12/05/2016   Nonintractable headache 12/05/2016   Occipital stroke ([place]) 12/05/2016   Hypertensive urgency 12/05/2016   Hypertension     Rennie Natter, PT, DPT 01/19/2021, 11:58 AM  [facility] [address]  [place], [place], [address] Phone: [phone]   Fax:   [phone]  Name: Doyne Ellinger MRN: [id number] Date of Birth: [birth year]

## 2021-01-19 NOTE — Progress Notes (Signed)
Subjective: 55 y.o. returns the office today for painful, elongated, thickened toenails which she  cannot trim herself as well as for calluses.  She states that she still doing physical therapy which has been helpful.  No recent injury or falls otherwise and she has no new concerns today.  PCP: Vevelyn Francois, NP Last Seen: January 06, 2021  A1c: 5.9 on January 06, 2021  Objective: AAO 3, NAD DP/PT pulses palpable, CRT less than 3 seconds Nails hypertrophic, dystrophic, elongated, brittle, discolored 10. There is tenderness overlying the nails 1-5 bilaterally. There is no surrounding erythema or drainage along the nail sites. Mild hyperkeratotic tissue noted along the medial hallux.  No underlying ulceration drainage or any signs of infection. There is mild tenderness palpation of the course of insertion of the plantar fascia.  There is no area of pinpoint tenderness otherwise there is no pain with lateral compression.  EXTR, extensor tendons appear to be intact.  No pain with Achilles tendon. No pain with calf compression, swelling, warmth, erythema.  Assessment: Patient presents with symptomatic onychomycosis; plantar fasciitis  Plan: -Treatment options including alternatives, risks, complications were discussed -Nails sharply debrided 10 without complication/bleeding. -As a courtesy debrided the calluses x2 without any complications or bleeding.  Recommend moisturizer and offloading. -Continue physical therapy.  Discussed supportive shoe gear. -Discussed daily foot inspection. If there are any changes, to call the office immediately.  -Follow-up in 3 months or sooner if any problems are to arise. In the meantime, encouraged to call the office with any questions, concerns, changes symptoms.  Celesta Gentile, DPM

## 2021-01-19 NOTE — Patient Instructions (Signed)
Access Code: [email or web address] URL: [email or web address] Date: 01/19/2021 Prepared by: Glenetta Hew  Exercises Sit to Stand - 1 x daily - 7 x weekly - 2 sets - 10 reps Seated Long Arc Quad - 1 x daily - 7 x weekly - 2 sets - 10 reps Seated Quad Set - 3 x daily - 7 x weekly - 1 sets - 10 reps - 10 sec hold Standing Quad Set - 3 x daily - 7 x weekly - 2 sets - 10 reps - 10 sec hold

## 2021-01-21 ENCOUNTER — Telehealth: Payer: Self-pay | Admitting: Pharmacist

## 2021-01-21 ENCOUNTER — Ambulatory Visit: Payer: Self-pay

## 2021-01-21 NOTE — Patient Outreach (Signed)
01/21/2021 Name: Brittany [name] MRN: [id number] DOB: 15-Apr-1965  Referred by: Vevelyn Francois, NP Reason for referral : No chief complaint on file.   An unsuccessful telephone outreach was attempted today. The patient was referred to the case management team for assistance with care management and care coordination.    Follow Up Plan: A HIPAA compliant phone message was left for the patient providing contact information and requesting a return call. and The Managed Medicaid care management team will reach out to the patient again over the next 10 days.   Hughes Better PharmD, CPP High Risk Managed Medicaid [place] [phone]

## 2021-01-24 ENCOUNTER — Other Ambulatory Visit: Payer: Self-pay

## 2021-01-24 DIAGNOSIS — R609 Edema, unspecified: Secondary | ICD-10-CM

## 2021-01-24 DIAGNOSIS — I10 Essential (primary) hypertension: Secondary | ICD-10-CM

## 2021-01-24 DIAGNOSIS — R0602 Shortness of breath: Secondary | ICD-10-CM

## 2021-01-24 MED ORDER — HYDROXYZINE PAMOATE 50 MG PO CAPS: 50.0000 mg | ORAL_CAPSULE | Freq: Three times a day (TID) | ORAL | 3 refills | Status: DC | PRN

## 2021-01-24 MED ORDER — ALBUTEROL SULFATE HFA 108 (90 BASE) MCG/ACT IN AERS: 2.0000 | INHALATION_SPRAY | RESPIRATORY_TRACT | 11 refills | Status: DC | PRN

## 2021-01-24 MED ORDER — FUROSEMIDE 20 MG PO TABS: 20.0000 mg | ORAL_TABLET | Freq: Two times a day (BID) | ORAL | 3 refills | Status: DC | PRN

## 2021-01-24 MED ORDER — ATORVASTATIN CALCIUM 40 MG PO TABS: 40.0000 mg | ORAL_TABLET | Freq: Every day | ORAL | 3 refills | Status: DC

## 2021-01-24 MED ORDER — METHOCARBAMOL 500 MG PO TABS: 500.0000 mg | ORAL_TABLET | Freq: Three times a day (TID) | ORAL | 3 refills | Status: DC

## 2021-01-24 MED ORDER — NIFEDIPINE ER OSMOTIC RELEASE 30 MG PO TB24: 30.0000 mg | ORAL_TABLET | Freq: Two times a day (BID) | ORAL | 3 refills | Status: DC

## 2021-01-26 ENCOUNTER — Encounter: Payer: Self-pay | Admitting: Physical Therapy

## 2021-01-26 ENCOUNTER — Ambulatory Visit: Payer: Medicaid Other | Admitting: Physical Therapy

## 2021-01-26 ENCOUNTER — Other Ambulatory Visit: Payer: Self-pay

## 2021-01-26 DIAGNOSIS — M6281 Muscle weakness (generalized): Secondary | ICD-10-CM

## 2021-01-26 DIAGNOSIS — M25572 Pain in left ankle and joints of left foot: Secondary | ICD-10-CM

## 2021-01-26 DIAGNOSIS — R2689 Other abnormalities of gait and mobility: Secondary | ICD-10-CM

## 2021-01-26 DIAGNOSIS — M25571 Pain in right ankle and joints of right foot: Secondary | ICD-10-CM

## 2021-01-26 DIAGNOSIS — R262 Difficulty in walking, not elsewhere classified: Secondary | ICD-10-CM

## 2021-01-26 NOTE — Therapy (Signed)
[facility] [address]  [name], [place], [address] Phone: [phone]   Fax:  [phone]  Physical Therapy Treatment  Patient Details  Name: Brittany [name] MRN: [id number] Date of Birth: November 09, 1965 Referring Provider (PT): Celesta Gentile DPM   Encounter Date: 01/26/2021   PT End of Session - 01/26/21 1625     Visit Number 9    Number of Visits 17    Date for PT Re-Evaluation 02/22/21    Authorization Type UHC Medicaid    Progress Note Due on Visit 17    PT Start Time 1620    PT Stop Time 1658    PT Time Calculation (min) 38 min    Activity Tolerance Patient tolerated treatment well    Behavior During Therapy WFL for tasks assessed/performed             Past Medical History:  Diagnosis Date   Anemia    Arthritis    trigger finger in left hand   Complication of anesthesia    per pt, hard to wake up!   Elevated cholesterol    Female bladder prolapse    per urologist, does not have prolaspe   GERD (gastroesophageal reflux disease)    Heart murmur    pt unsure.   Hiatal hernia    Hypertension    IBS (irritable bowel syndrome)    Multiple myeloma ([place]) 2005   had partial chemo   Peripheral neuropathy    SOB (shortness of breath) on exertion    uses an inhaler   Stroke [facility]) 2017   paralysis left arm/uses a walker    Past Surgical History:  Procedure Laterality Date   BONE BIOPSY  2005   in her back   COLONOSCOPY     over 10 years x3   ESOPHAGOGASTRODUODENOSCOPY     incomplete-over 10 years ago    TEE WITHOUT CARDIOVERSION N/A 12/08/2016   Procedure: TRANSESOPHAGEAL ECHOCARDIOGRAM (TEE);  Surgeon: Sueanne Margarita, MD;  Location: [facility] ENDOSCOPY;  Service: Cardiovascular;  Laterality: N/A;   TUBAL LIGATION     UPPER GASTROINTESTINAL ENDOSCOPY     WISDOM TOOTH EXTRACTION      There were no vitals filed for this visit.   Subjective Assessment - 01/26/21 1624     Subjective Patient  reports her feet are doing well.  She has been busy today with errands, and chest feels a little tight even after inhaler.    Pertinent History CVA    Diagnostic tests X-rays of heels -  No evidence of acute fracture or stress fracture identified.    Patient Stated Goals "leg strength and walk better"    Currently in Pain? No/denies                               [facility] Adult PT Treatment/Exercise - 01/26/21 0001       Exercises   Exercises Knee/Hip      Knee/Hip Exercises: Aerobic   Nustep L5x4mn      Knee/Hip Exercises: Standing   Heel Raises Both;2 sets;10 reps    Knee Flexion Strengthening;Both;10 reps    Hip Abduction Stengthening;Both;10 reps    Hip Extension Stengthening;Both;10 reps    Functional Squat --    Other Standing Knee Exercises 2 x 200' with 40TUU   Other Standing Knee Exercises side steps 4 x 10 bil at counter for support.  PT Short Term Goals - 12/13/20 1819       PT SHORT TERM GOAL #1   Title Patient to be independent with initial HEP.    Baseline initial HEP given    Time 2    Period Weeks    Status Achieved   12/13/20- reports compliance.   Target Date 12/21/20               PT Long Term Goals - 01/19/21 1033       PT LONG TERM GOAL #1   Title Patient to be independent with advanced HEP.    Time 6    Period Weeks    Status On-going   01/19/21- compliant with current, progressed   Target Date 02/22/21      PT LONG TERM GOAL #2   Title Pt. will demonstrate 4+/5 bil LE strength throughout for safety with gait.    Baseline 4/5 hip strength    Time 6    Period Weeks    Status On-going   01/11/21; global weakness in hips and knees below 4+/5     PT LONG TERM GOAL #3   Title Pt. will demonstrate at least 10 deg bil ankle DF for improve gait mechanics.    Baseline 0 deg ankle DF bil, gastroc and soleus tightness.    Time 6    Period Weeks    Status On-going   01/11/21- improvements  made in B ankle DF     PT LONG TERM GOAL #4   Title Patient to report tolerance for 30 minutes on her feet without pain limiting.    Baseline increased bil heel pain with standing walking +10 min    Time 6    Period Weeks    Status On-going   01/11/21- limited to 5 min due to heel and callous pain     PT LONG TERM GOAL #5   Title Pt. will demonstrate improved functional LE strength by improving 5x STS to <45 sec to decrease fall risk.    Baseline 1 minute    Time 6    Period Weeks    Status New      Additional Long Term Goals   Additional Long Term Goals Yes      PT LONG TERM GOAL #6   Title Patient will demonstrate decreased risk of falls by improving Berg Score to >35/56 to be safe not using walker indoors.    Baseline 28/56 increased risk for falls, indicates need for walker at all times, patient furniture surfs at home.    Time 6    Period Weeks    Status New    Target Date 02/22/21                   Plan - 01/26/21 1704     Clinical Impression Statement [name] demonstrates significantly more LE endurance today, consistent with report of working on HEP and standing tolerance at home.  She was able to tolerate progression of standing exercises today, although noted rapid fatigue with standing hip abduction/extension/HS curls on stance leg.  She also had difficulty due to report of asthma with cold air.  She is to continue current HEP and would benefit from continued skilled therapy.    Personal Factors and Comorbidities Comorbidity 3+    Comorbidities history of CVA, balance deficits, L knee pain, HTN    PT Frequency 2x / week    PT Duration 6 weeks    PT Treatment/Interventions ADLs/Self  Care Home Management;Cryotherapy;Moist Heat;Stair training;Therapeutic activities;Gait training;Therapeutic exercise;Balance training;Neuromuscular re-education;Orthotic Fit/Training;Patient/family education;Manual techniques;Passive range of motion;Dry needling;Taping;Joint  Manipulations    PT Next Visit Plan update hip strengthening HEP; assess balance, ankle/LE strengthening.    PT Home Exercise Plan Access Code: [email or web address]    Consulted and Agree with Plan of Care Patient             Patient will benefit from skilled therapeutic intervention in order to improve the following deficits and impairments:  Abnormal gait, Decreased activity tolerance, Decreased endurance, Decreased range of motion, Decreased strength, Increased fascial restricitons, Decreased balance, Decreased mobility, Difficulty walking, Increased muscle spasms, Impaired flexibility, Pain  Visit Diagnosis: Other abnormalities of gait and mobility  Muscle weakness (generalized)  Pain in left ankle and joints of left foot  Pain in right ankle and joints of right foot  Difficulty in walking, not elsewhere classified     Problem List Patient Active Problem List   Diagnosis Date Noted   Ganglion cyst of volar aspect of left wrist 05/20/2020   Trigger thumb of left hand 05/20/2020   Labral tear of shoulder, degenerative, right 05/20/2020   Anxiety 02/05/2019   Oral thrush 10/08/2018   Bilateral leg pain 10/08/2018   Constipation 10/08/2018   Shortness of breath 10/08/2018   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  [place], [place], [address] Phone: [phone]   Fax:  [phone]  Name: Deshia Vanderhoof MRN: [id number] Date of Birth: 01-Apr-1965

## 2021-02-02 ENCOUNTER — Ambulatory Visit: Payer: Medicaid Other | Admitting: Physical Therapy

## 2021-02-02 ENCOUNTER — Other Ambulatory Visit: Payer: Self-pay

## 2021-02-02 ENCOUNTER — Encounter: Payer: Self-pay | Admitting: Physical Therapy

## 2021-02-02 DIAGNOSIS — M6281 Muscle weakness (generalized): Secondary | ICD-10-CM

## 2021-02-02 DIAGNOSIS — M25572 Pain in left ankle and joints of left foot: Secondary | ICD-10-CM

## 2021-02-02 DIAGNOSIS — R2689 Other abnormalities of gait and mobility: Secondary | ICD-10-CM

## 2021-02-02 DIAGNOSIS — R262 Difficulty in walking, not elsewhere classified: Secondary | ICD-10-CM

## 2021-02-02 DIAGNOSIS — M25571 Pain in right ankle and joints of right foot: Secondary | ICD-10-CM

## 2021-02-02 NOTE — Therapy (Addendum)
PHYSICAL THERAPY DISCHARGE SUMMARY  Visits from Start of Care: 10  Current functional level related to goals / functional outcomes: Patient consistently reported no pain in heels, which was original referral.     Remaining deficits: Significant LE weakness, gait and balance deficits   Education / Equipment: HEP  Plan:  Patient goals were not met. Patient is being discharged due to attendance policy.  Patient no-showed last appointment, Medicaid authorization has expired, and patient has not called to schedule new appointment or been seen since 02/02/2021.  She would need new referral to return to PT.    Rennie Natter, PT, DPT 03/25/2021   [facility] [address]  [place], [place], [address] Phone: [phone]   Fax:  [phone]  Physical Therapy Treatment  Patient Details  Name: Brittany [name] MRN: [id number] Date of Birth: September 21, 1965 Referring Provider (PT): Celesta Gentile DPM   Encounter Date: 02/02/2021   PT End of Session - 02/02/21 1714     Visit Number 10    Number of Visits 17    Date for PT Re-Evaluation 02/22/21    Authorization Type [employer] Medicaid    Progress Note Due on Visit 17    PT Start Time 1710    PT Stop Time 1745    PT Time Calculation (min) 35 min    Activity Tolerance Patient tolerated treatment well    Behavior During Therapy WFL for tasks assessed/performed             Past Medical History:  Diagnosis Date   Anemia    Arthritis    trigger finger in left hand   Complication of anesthesia    per pt, hard to wake up!   Elevated cholesterol    Female bladder prolapse    per urologist, does not have prolaspe   GERD (gastroesophageal reflux disease)    Heart murmur    pt unsure.   Hiatal hernia    Hypertension    IBS (irritable bowel syndrome)    Multiple myeloma ([place]) 2005   had partial chemo   Peripheral neuropathy    SOB (shortness of breath) on exertion     uses an inhaler   Stroke [facility]) 2017   paralysis left arm/uses a walker    Past Surgical History:  Procedure Laterality Date   BONE BIOPSY  2005   in her back   COLONOSCOPY     over 10 years x3   ESOPHAGOGASTRODUODENOSCOPY     incomplete-over 10 years ago    TEE WITHOUT CARDIOVERSION N/A 12/08/2016   Procedure: TRANSESOPHAGEAL ECHOCARDIOGRAM (TEE);  Surgeon: Sueanne Margarita, MD;  Location: [facility] ENDOSCOPY;  Service: Cardiovascular;  Laterality: N/A;   TUBAL LIGATION     UPPER GASTROINTESTINAL ENDOSCOPY     WISDOM TOOTH EXTRACTION      There were no vitals filed for this visit.   Subjective Assessment - 02/02/21 1714     Subjective Patient reports the cold weather makes her body hurt, so she stayed home today and rested until therapy today.    Pertinent History CVA    Diagnostic tests X-rays of heels -  No evidence of acute fracture or stress fracture identified.    Patient Stated Goals "leg strength and walk better"    Currently in Pain? No/denies  [facility] Adult PT Treatment/Exercise - 02/02/21 0001       Exercises   Exercises Knee/Hip      Knee/Hip Exercises: Aerobic   Nustep L5x61mn      Knee/Hip Exercises: Standing   Heel Raises Both;2 sets;10 reps    Heel Raises Limitations bil UE support on counter    Hip Abduction Stengthening;Both;10 reps;2 sets    Abduction Limitations bil UE support on counter    Hip Extension Stengthening;Both;10 reps;2 sets    Extension Limitations bil UE support on counter    Functional Squat 2 sets;10 reps    Functional Squat Limitations chair behind for safety, with bil UE support.    Other Standing Knee Exercises gait with 4WRW x 250'                       PT Short Term Goals - 12/13/20 1819       PT SHORT TERM GOAL #1   Title Patient to be independent with initial HEP.    Baseline initial HEP given    Time 2    Period Weeks    Status Achieved   12/13/20- reports  compliance.   Target Date 12/21/20               PT Long Term Goals - 01/19/21 1033       PT LONG TERM GOAL #1   Title Patient to be independent with advanced HEP.    Time 6    Period Weeks    Status On-going   01/19/21- compliant with current, progressed   Target Date 02/22/21      PT LONG TERM GOAL #2   Title Pt. will demonstrate 4+/5 bil LE strength throughout for safety with gait.    Baseline 4/5 hip strength    Time 6    Period Weeks    Status On-going   01/11/21; global weakness in hips and knees below 4+/5     PT LONG TERM GOAL #3   Title Pt. will demonstrate at least 10 deg bil ankle DF for improve gait mechanics.    Baseline 0 deg ankle DF bil, gastroc and soleus tightness.    Time 6    Period Weeks    Status On-going   01/11/21- improvements made in B ankle DF     PT LONG TERM GOAL #4   Title Patient to report tolerance for 30 minutes on her feet without pain limiting.    Baseline increased bil heel pain with standing walking +10 min    Time 6    Period Weeks    Status On-going   01/11/21- limited to 5 min due to heel and callous pain     PT LONG TERM GOAL #5   Title Pt. will demonstrate improved functional LE strength by improving 5x STS to <45 sec to decrease fall risk.    Baseline 1 minute    Time 6    Period Weeks    Status New      Additional Long Term Goals   Additional Long Term Goals Yes      PT LONG TERM GOAL #6   Title Patient will demonstrate decreased risk of falls by improving Berg Score to >35/56 to be safe not using walker indoors.    Baseline 28/56 increased risk for falls, indicates need for walker at all times, patient furniture surfs at home.    Time 6    Period Weeks    Status  New    Target Date 02/22/21                   Plan - 02/02/21 1753     Clinical Impression Statement [name] continues to demonstrate improved standing tolerance, although today limited by complaint of L arm pain when using hand to stabilize,  also complaining of arm discomfort with prolonged walking.  When seated with gentle postural exercises (retro shoulder rolls and stretch) complaint disappeared.  May be due to posture with gait, will continue to monitor.  Otherwise able to complete exercises with improved stability and needing fewer rest breaks and able to increase reps.  She would benefit from continued skilled therapy.    Personal Factors and Comorbidities Comorbidity 3+    Comorbidities history of CVA, balance deficits, L knee pain, HTN    PT Frequency 2x / week    PT Duration 6 weeks    PT Treatment/Interventions ADLs/Self Care Home Management;Cryotherapy;Moist Heat;Stair training;Therapeutic activities;Gait training;Therapeutic exercise;Balance training;Neuromuscular re-education;Orthotic Fit/Training;Patient/family education;Manual techniques;Passive range of motion;Dry needling;Taping;Joint Manipulations    PT Next Visit Plan update hip strengthening HEP; assess balance, ankle/LE strengthening.    PT Home Exercise Plan Access Code: [email or web address]    Consulted and Agree with Plan of Care Patient             Patient will benefit from skilled therapeutic intervention in order to improve the following deficits and impairments:  Abnormal gait, Decreased activity tolerance, Decreased endurance, Decreased range of motion, Decreased strength, Increased fascial restricitons, Decreased balance, Decreased mobility, Difficulty walking, Increased muscle spasms, Impaired flexibility, Pain  Visit Diagnosis: Other abnormalities of gait and mobility  Muscle weakness (generalized)  Pain in left ankle and joints of left foot  Pain in right ankle and joints of right foot  Difficulty in walking, not elsewhere classified     Problem List Patient Active Problem List   Diagnosis Date Noted   Ganglion cyst of volar aspect of left wrist 05/20/2020   Trigger thumb of left hand 05/20/2020   Labral tear of shoulder, degenerative, right  05/20/2020   Anxiety 02/05/2019   Oral thrush 10/08/2018   Bilateral leg pain 10/08/2018   Constipation 10/08/2018   Shortness of breath 10/08/2018   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   B12 deficiency 12/19/2016   Chest pain 12/18/2016   Cerebellar infarct ([place])    Diastolic dysfunction    Marijuana abuse    History of tobacco abuse    Benign essential HTN    Tachycardia    Hypokalemia    Stroke (cerebrum) ([place]) 12/05/2016   Nonintractable headache 12/05/2016   Occipital stroke ([place]) 12/05/2016   Hypertensive urgency 12/05/2016   Hypertension     Rennie Natter, PT, DPT 02/02/2021, 5:56 PM  [facility] [address]  [place], [place], [address] Phone: [phone]   Fax:  [phone]  Name: Brittany [name] MRN: [id number] Date of Birth: [birth year]

## 2021-02-08 ENCOUNTER — Other Ambulatory Visit: Payer: Self-pay | Admitting: *Deleted

## 2021-02-08 NOTE — Patient Outreach (Signed)
Care Coordination  02/08/2021  Sharyl Panchal January 31, 1966 [id number]   Medicaid Managed Care   Unsuccessful Outreach Note  02/08/2021 Name: Brittany [name] MRN: [id number] DOB: [birth year]  Referred by: Vevelyn Francois, NP Reason for referral : High Risk Managed Medicaid (Unsuccessful RNCM follow up outreach)   An unsuccessful telephone outreach was attempted today. The patient was referred to the case management team for assistance with care management and care coordination.   Follow Up Plan: A HIPAA compliant phone message was left for the patient providing contact information and requesting a return call.   Lurena Joiner RN, BSN [place]  [employer] manager

## 2021-02-08 NOTE — Patient Instructions (Signed)
Visit Information  Ms. Adileny Delon  - as a part of your Medicaid benefit, you are eligible for care management and care coordination services at no cost or copay. I was unable to reach you by phone today but would be happy to help you with your health related needs. Please feel free to call me @ [phone].   A member of the Managed Medicaid care management team will reach out to you again over the next 14 days.   Lurena Joiner RN, BSN [place]  [employer] manager

## 2021-02-09 ENCOUNTER — Ambulatory Visit: Payer: Medicaid Other | Admitting: Physical Therapy

## 2021-02-15 ENCOUNTER — Ambulatory Visit: Payer: Medicaid Other

## 2021-02-17 ENCOUNTER — Ambulatory Visit: Payer: Medicaid Other | Admitting: Physical Therapy

## 2021-02-17 ENCOUNTER — Telehealth: Payer: Self-pay | Admitting: Nurse Practitioner

## 2021-02-17 NOTE — Telephone Encounter (Signed)
..   Medicaid Managed Care   Unsuccessful Outreach Note  02/17/2021 Name: Brittany [name] MRN: [id number] DOB: 1965/08/18  Referred by: Vevelyn Francois, NP Reason for referral : High Risk Managed Medicaid (Called patient today to get her phone visit with the MM Pharmacist rescheduled. I left my name and number on her VM.)   An unsuccessful telephone outreach was attempted today. The patient was referred to the case management team for assistance with care management and care coordination.   Follow Up Plan: The care management team will reach out to the patient again over the next 7 days.   [place]

## 2021-02-21 ENCOUNTER — Ambulatory Visit: Payer: Medicaid Other | Attending: Podiatry | Admitting: Physical Therapy

## 2021-02-22 ENCOUNTER — Other Ambulatory Visit: Payer: Self-pay | Admitting: *Deleted

## 2021-02-22 ENCOUNTER — Other Ambulatory Visit: Payer: Self-pay

## 2021-02-22 NOTE — Patient Outreach (Signed)
Medicaid Managed Care   Nurse Care Manager Note  02/22/2021 Name:  Brittany [name] MRN:  [id number] DOB:  08/24/1965  Brittany [name] is an 55 y.o. year old female who is a primary patient of Vevelyn Francois, NP.  The [facility] Managed Care Coordination team was consulted for assistance with:    HTN Chronic pain  Brittany [name] was given information about Medicaid Managed Care Coordination team services today. Brittany [name] Patient agreed to services and verbal consent obtained.  Engaged with patient by telephone for follow up visit in response to provider referral for case management and/or care coordination services.   Assessments/Interventions:  Review of past medical history, allergies, medications, health status, including review of consultants reports, laboratory and other test data, was performed as part of comprehensive evaluation and provision of chronic care management services.  SDOH (Social Determinants of Health) assessments and interventions performed: SDOH Interventions    Flowsheet Row Most Recent Value  SDOH Interventions   Housing Interventions Intervention Not Indicated       Care Plan  Allergies  Allergen Reactions   Albuterol Swelling and Palpitations   Amitriptyline Other (See Comments)    Patient reported that it made her throat feel like its locking up and it also caused issues with her going to the bathroom   Duloxetine Other (See Comments)    Patient reported that it made her throat lock up and it caused her to have issue with going to the bathroom   Naproxen     Vomiting, sweating, abd spasms   Other     States can't take pain meds that end in "cet" or meds that end in "ine" Darvocet/severe vomiting   Darvon [Propoxyphene] Nausea And Vomiting   Hydrocodone Nausea And Vomiting   Lactose Intolerance (Gi)     Bloating, gas, abd pain   Latex Itching and Rash   Oxycodone Nausea And Vomiting   Percocet [Oxycodone-Acetaminophen] Nausea And Vomiting    Topamax [Topiramate]     Memory made her emotional     Medications Reviewed Today     Reviewed by Melissa Montane, RN (Registered Nurse) on 02/22/21 at [place] List Status: <None>   Medication Order Taking? Sig Documenting Provider Last Dose Status Informant  albuterol (VENTOLIN HFA) 108 (90 Base) MCG/ACT inhaler [id number] Yes Inhale 2 puffs into the lungs every 4 (four) hours as needed for wheezing or shortness of breath. Vevelyn Francois, NP Taking Active   amLODipine (NORVASC) 10 MG tablet [id number] Yes Take 1 tablet (10 mg total) by mouth daily. Vevelyn Francois, NP Taking Active   ammonium lactate (AMLACTIN) 12 % lotion [id number] Yes Apply 1 application topically as needed for dry skin. Trula Slade, DPM Taking Active   aspirin 325 MG tablet [id number] Yes Take 1 tablet (325 mg total) by mouth daily. Cristal Ford, DO Taking Active Self  atorvastatin (LIPITOR) 40 MG tablet [id number] Yes Take 1 tablet (40 mg total) by mouth daily. Vevelyn Francois, NP Taking Active   Blood Pressure Monitor KIT [id number] Yes 1 kit by Does not apply route daily. Vevelyn Francois, NP Taking Active   ciclopirox [facility]) 8 % solution [id number] Yes Apply topically at bedtime. Apply over nail and surrounding skin. Apply daily over previous coat. After seven (7) days, may remove with alcohol and continue cycle. Trula Slade, DPM Taking Active   cyanocobalamin 1000 MCG tablet [id number] Yes Take 1 tablet (1,000 mcg total) by mouth daily. Azzie Glatter, FNP  Taking Active   dimenhyDRINATE (DRAMAMINE) 50 MG tablet [id number] No Take 50 mg by mouth every 8 (eight) hours as needed for dizziness.  Patient not taking: Reported on 02/22/2021   [provider] Not Taking Active            Med Note [name] Aug 04, 2019  2:20 PM) As needed.   docusate sodium (COLACE) 100 MG capsule [id number] Yes Take 200 mg by mouth 2 (two) times daily. [provider] Taking Active Self   Ergocalciferol (VITAMIN D2) 10 MCG (400 UNIT) TABS [id number] Yes Take 10 mcg by mouth daily. Azzie Glatter, FNP Taking Active   furosemide (LASIX) 20 MG tablet [id number] Yes Take 1 tablet (20 mg total) by mouth 2 (two) times daily as needed. Vevelyn Francois, NP Taking Active   gabapentin (NEURONTIN) 300 MG capsule [id number] Yes Take 1 capsule (300 mg total) by mouth 3 (three) times daily. Vevelyn Francois, NP Taking Active   hydrOXYzine (VISTARIL) 50 MG capsule [id number] Yes Take 1-2 capsules (50-100 mg total) by mouth 3 (three) times daily as needed for itching. Vevelyn Francois, NP Taking Active   methocarbamol (ROBAXIN) 500 MG tablet [id number] Yes Take 1 tablet (500 mg total) by mouth 3 (three) times daily. Vevelyn Francois, NP Taking Active   Multiple Vitamin (MULTIVITAMIN) capsule [id number] Yes Take 4 capsules by mouth 2 (two) times daily. Centrum plus [provider] Taking Active Self  NIFEdipine (PROCARDIA-XL/NIFEDICAL-XL) 30 MG 24 hr tablet [id number] Yes Take 1 tablet (30 mg total) by mouth 2 (two) times daily. Vevelyn Francois, NP Taking Active   omeprazole (PRILOSEC) 20 MG capsule [id number] Yes Take 1 capsule (20 mg total) by mouth 2 (two) times daily before a meal. Vevelyn Francois, NP Taking Active   senna (SENOKOT) 8.6 MG TABS tablet [id number] Yes Take 2 tablets (17.2 mg total) by mouth 2 (two) times daily. Vevelyn Francois, NP Taking Active   triamcinolone cream (KENALOG) 0.1 % [id number] Yes Apply 1 application topically 2 (two) times daily. Vevelyn Francois, NP Taking Active             Patient Active Problem List   Diagnosis Date Noted   Ganglion cyst of volar aspect of left wrist 05/20/2020   Trigger thumb of left hand 05/20/2020   Labral tear of shoulder, degenerative, right 05/20/2020   Anxiety 02/05/2019   Oral thrush 10/08/2018   Bilateral leg pain 10/08/2018   Constipation 10/08/2018   Shortness of breath 10/08/2018   Late effect of cerebrovascular accident  (CVA) 09/06/2018   B12 deficiency 12/19/2016   Chest pain 12/18/2016   Cerebellar infarct ([place])    Diastolic dysfunction    Marijuana abuse    History of tobacco abuse    Benign essential HTN    Tachycardia    Hypokalemia    Stroke (cerebrum) ([place]) 12/05/2016   Nonintractable headache 12/05/2016   Occipital stroke ([place]) 12/05/2016   Hypertensive urgency 12/05/2016   Hypertension     Conditions to be addressed/monitored per PCP order:  HTN and chronic pain  Care Plan : Adult Health Management  Updates made by Melissa Montane, RN since 02/22/2021 12:00 AM  Completed 02/22/2021   Problem: Chronic Pain Management (Chronic Pain) Resolved 02/22/2021  Note:   Resolving due to duplicate goal     Long-Range Goal: Chronic Pain Managed Completed 02/22/2021  Start Date: 09/22/2020  Expected End Date: 02/08/2021  Recent Progress: On  track  Priority: High  Note:   Current Barriers:  Knowledge Deficits related to self-health management of acute or chronic pain Chronic Disease Management support and education needs related to chronic pain-Brittany [name] has an appointment with a new PCP. She would like to discuss several concerns with her new PCP(new pain in both feet, needing a shower chair and BP monitor). She does not take all of her medications due to financial strain. She uses her monthly income to pay rent, insurance, power and gas. She has very little money left over.-Update-Brittany [name] is seeing a podiatrist and starting PT for her feet pain(plantar fasciitis). She is getting her medications delivered and taking as directed. She has received resources and very thankful for the assistance. She is having pain and numbness in her arms at night while trying to sleep. Chronic Disease Management support and education needs related to chronic pain Financial Constraints.  Does not adhere to prescribed medication regimen-Patient is unable to afford all of her medications Resolving due to duplicate  goal  Clinical Goal(s):  patient will verbalize understanding of plan for pain management. , patient will attend all scheduled medical appointments: 10/06/20 with new PCP, patient will demonstrate use of different relaxation  skills and/or diversional activities to assist with pain reduction (distraction, imagery, relaxation, massage, acupressure, TENS, heat, and cold application., and patient will use pharmacological and nonpharmacological pain relief strategies as prescribed.  Interventions:  Discussed plans with patient for ongoing care management follow up and provided patient with direct contact information for care management team Evaluation of current treatment plan related to pain and patient's adherence to plan as established by provider. Advised patient to make a list of things that she would like to discuss at her next appointment with PCP Provided education to patient re: managing pain Reviewed medications with patient and discussed the importance of taking all medications as directed Patient Goals/Self Care Activities:  - make a list of things you want to discuss at new PCP appointment - develop a personal pain management plan - keep track of prescription refills - plan exercise or activity when pain is best controlled - start a pain diary - track what makes the pain worse and what makes it better - use ice or heat for pain relief - work slower and less intense when having pain  Follow Up Plan: Telephone follow up appointment with care management team member scheduled for:02/08/21 @ 3:45pm     Problem: Hypertension (Hypertension) Resolved 02/22/2021  Note:   Resolving due to duplicate goal     Long-Range Goal: Hypertension Monitored Completed 02/22/2021  Start Date: 09/22/2020  Expected End Date: 02/08/2021  Recent Progress: On track  Priority: High  Note:   Objective:  Last practice recorded BP readings:  BP Readings from Last 3 Encounters:  10/06/20 (!) 166/79   07/06/20 (!) 186/90  05/20/20 140/72   Most recent eGFR/CrCl: No results found for: EGFR  No components found for: CRCL  Current Barriers:  Knowledge Deficits related to basic understanding of hypertension pathophysiology and self care management-Brittany [name] is checking her BP daily, reading today 149/93. She cooks most of her food and likes to eat fruits and vegetables. She has all of her medications and is taking as prescribed. Difficulty obtaining medications Non-adherence to prescribed medication regimen Financial Constraints.  Does not contact provider office for questions/concerns Resolving due to duplicate goal Case Manager Clinical Goal(s):  patient will verbalize understanding of plan for hypertension management patient will attend all scheduled medical  appointments: 10/06/20 with new PCP patient will demonstrate improved adherence to prescribed treatment plan for hypertension as evidenced by taking all medications as prescribed, monitoring and recording blood pressure as directed, adhering to low sodium/DASH diet Interventions:  Provided education to patient re: stroke prevention, s/s of heart attack and stroke, DASH diet, complications of uncontrolled blood pressure Reviewed medications with patient and discussed importance of compliance Discussed plans with patient for ongoing care management follow up and provided patient with direct contact information for care management team Reviewed scheduled/upcoming provider appointments including: Physical Therapy, PCP and [facility]  Patient Goals/Self-Care Activities:  - Self administers medications as prescribed - Attends all scheduled provider appointments - Calls provider office for new concerns, questions, or BP outside discussed parameters - Checks BP and records as discussed - Follows a low sodium diet/DASH diet - increase exercise to 30 min/day for 5 days a week  RNCM will follow up with a telephone visit on 02/08/21 @  3:45pm      Care Plan : [place] of Care  Updates made by Melissa Montane, RN since 02/22/2021 12:00 AM     Problem: Chronic health management needs related to chronic pain and HTN      Long-Range Goal: Development of Plan of Care to address health management needs related to chronic pain and HTN   Start Date: 02/22/2021  Expected End Date: 05/23/2021  Priority: High  Note:   Current Barriers:  Chronic Disease Management support and education needs related to HTN and chronic pain  RNCM Clinical Goal(s):  Patient will verbalize understanding of plan for management of HTN and chronic pain as evidenced by patient verbalization and self monitoring activities take all medications exactly as prescribed and will call provider for medication related questions as evidenced by documentation in EMR    attend all scheduled medical appointments: 04/19/21 @ [facility] and 07/07/21 with PCP as evidenced by provider documentation in EMR        continue to work with RN Care Manager and/or Social Worker to address care management and care coordination needs related to HTN and chronic pain as evidenced by adherence to CM Team Scheduled appointments     through collaboration with Consulting civil engineer, provider, and care team.   Interventions: Inter-disciplinary care team collaboration (see longitudinal plan of care) Evaluation of current treatment plan related to  self management and patient's adherence to plan as established by provider   Hypertension: (Status: New goal.) Long Term Goal Last practice recorded BP readings:  BP Readings from Last 3 Encounters:  01/06/21 135/65  10/06/20 (!) 166/79  07/06/20 (!) 186/90  Most recent eGFR/CrCl:  Lab Results  Component Value Date   EGFR 87 01/06/2021    No components found for: CRCL  Evaluation of current treatment plan related to hypertension self management and patient's adherence to plan as established by provider;   Provided education to patient re:  stroke prevention, s/s of heart attack and stroke; Reviewed prescribed diet dash Reviewed medications with patient and discussed importance of compliance;  Discussed plans with patient for ongoing care management follow up and provided patient with direct contact information for care management team; Advised patient, providing education and rationale, to monitor blood pressure daily and record, calling PCP for findings outside established parameters;  Assessed social determinant of health barriers;   Pain:  (Status: New goal.) Long Term Goal  Pain assessment performed Medications reviewed Reviewed provider established plan for pain management; Discussed importance of adherence to  all scheduled medical appointments; Counseled on the importance of reporting any/all new or changed pain symptoms or management strategies to pain management provider; Reviewed with patient prescribed pharmacological and nonpharmacological pain relief strategies; Advised patient to discuss pain management with provider; Advised to schedule a follow up with Dr. Rosiland Oz  Patient Goals/Self-Care Activities: Take medications as prescribed   Attend all scheduled provider appointments Call pharmacy for medication refills 3-7 days in advance of running out of medications Call provider office for new concerns or questions  check blood pressure daily write blood pressure results in a log or diary keep a blood pressure log take blood pressure log to all doctor appointments develop an action plan for high blood pressure take medications for blood pressure exactly as prescribed eat more whole grains, fruits and vegetables, lean meats and healthy fats       Follow Up:  Patient agrees to Care Plan and Follow-up.  Plan: The Managed Medicaid care management team will reach out to the patient again over the next 30 days.  Date/time of next scheduled RN care management/care coordination outreach:  03/24/21 @  3:45pm  Lurena Joiner RN, BSN   [employer] manager

## 2021-02-22 NOTE — Patient Instructions (Signed)
Visit Information  Ms. Langsam was given information about Medicaid Managed Care team care coordination services as a part of their [place] Medicaid benefit. Darice Vicario verbally consented to engagement with the [facility] Managed Care team.   If you are experiencing a medical emergency, please call 911 or report to your local emergency department or urgent care.   If you have a non-emergency medical problem during routine business hours, please contact your provider's office and ask to speak with a nurse.   For questions related to your [facility], please call: [phone] or visit the homepage here: [email or web address]  If you would like to schedule transportation through your [facility], please call the following number at least 2 days in advance of your appointment: [phone].   Call the [place] at [phone], at any time, 24 hours a day, 7 days a week. If you are in danger or need immediate medical attention call 911.  If you would like help to quit smoking, call 1-800-QUIT-NOW 941-583-9840) OR Espaol: 1-855-Djelo-Ya (7-353-299-2426) o para ms informacin haga clic aqu or Text READY to 200-400 to register via text  Ms. Gorin - following are the goals we discussed in your visit today:   Goals Addressed             This Visit's Progress    COMPLETED: Manage Chronic Pain       Resolving due to duplicate goal  Timeframe:  Long-Range Goal Priority:  High Start Date:   09/22/20                          Expected End Date:  02/08/21                       Follow Up Date 02/08/2021    - make a list of things you want to discuss at new PCP appointment - develop a personal pain management plan - keep track of prescription refills - plan exercise or activity when pain is best controlled - start a pain diary - track what  makes the pain worse and what makes it better - use ice or heat for pain relief - work slower and less intense when having pain    Why is this important?   Day-to-day life can be hard when you have chronic pain.  Pain medicine is just one piece of the treatment puzzle.  You can try these action steps to help you manage your pain.         COMPLETED: Track and Manage My Blood Pressure-Hypertension       Resolving due to duplicate goal  Timeframe:  Long-Range Goal Priority:  High Start Date: 09/22/20                            Expected End Date:  02/08/21                     Follow Up Date 02/08/21   - Self administers medications as prescribed - Attends all scheduled provider appointments - Calls provider office for new concerns, questions, or BP outside discussed parameters - Checks BP and records as discussed - Follows a low sodium diet/DASH diet - increase exercise to 30 min/day for 5 days a week     Why is this important?   You won't feel high  blood pressure, but it can still hurt your blood vessels.  High blood pressure can cause heart or kidney problems. It can also cause a stroke.  Making lifestyle changes like losing a little weight or eating less salt will help.  Checking your blood pressure at home and at different times of the day can help to control blood pressure.  If the doctor prescribes medicine remember to take it the way the doctor ordered.  Call the office if you cannot afford the medicine or if there are questions about it.             Please see education materials related to managing pain and HTN provided as print materials.   The patient verbalized understanding of instructions provided today and agreed to receive a mailed copy of patient instruction and/or educational materials.  Telephone follow up appointment with Managed Medicaid care management team member scheduled for:03/24/21 @ 3:45pm  Lurena Joiner RN, BSN [place] RN Care Coordinator   Following is a copy of your plan of care:  Care Plan : Adult Health Management  Updates made by Melissa Montane, RN since 02/22/2021 12:00 AM  Completed 02/22/2021   Problem: Chronic Pain Management (Chronic Pain) Resolved 02/22/2021  Note:   Resolving due to duplicate goal     Long-Range Goal: Chronic Pain Managed Completed 02/22/2021  Start Date: 09/22/2020  Expected End Date: 02/08/2021  Recent Progress: On track  Priority: High  Note:   Current Barriers:  Knowledge Deficits related to self-health management of acute or chronic pain Chronic Disease Management support and education needs related to chronic pain-Ms. Cannady has an appointment with a new PCP. She would like to discuss several concerns with her new PCP(new pain in both feet, needing a shower chair and BP monitor). She does not take all of her medications due to financial strain. She uses her monthly income to pay rent, insurance, power and gas. She has very little money left over.-Update-Ms. Snooks is seeing a podiatrist and starting PT for her feet pain(plantar fasciitis). She is getting her medications delivered and taking as directed. She has received resources and very thankful for the assistance. She is having pain and numbness in her arms at night while trying to sleep. Chronic Disease Management support and education needs related to chronic pain Financial Constraints.  Does not adhere to prescribed medication regimen-Patient is unable to afford all of her medications Resolving due to duplicate goal  Clinical Goal(s):  patient will verbalize understanding of plan for pain management. , patient will attend all scheduled medical appointments: 10/06/20 with new PCP, patient will demonstrate use of different relaxation  skills and/or diversional activities to assist with pain reduction (distraction, imagery, relaxation, massage, acupressure, TENS, heat, and cold application., and patient will  use pharmacological and nonpharmacological pain relief strategies as prescribed.  Interventions:  Discussed plans with patient for ongoing care management follow up and provided patient with direct contact information for care management team Evaluation of current treatment plan related to pain and patient's adherence to plan as established by provider. Advised patient to make a list of things that she would like to discuss at her next appointment with PCP Provided education to patient re: managing pain Reviewed medications with patient and discussed the importance of taking all medications as directed Patient Goals/Self Care Activities:  - make a list of things you want to discuss at new PCP appointment - develop a personal pain management plan - keep  track of prescription refills - plan exercise or activity when pain is best controlled - start a pain diary - track what makes the pain worse and what makes it better - use ice or heat for pain relief - work slower and less intense when having pain  Follow Up Plan: Telephone follow up appointment with care management team member scheduled for:02/08/21 @ 3:45pm     Problem: Hypertension (Hypertension) Resolved 02/22/2021  Note:   Resolving due to duplicate goal     Long-Range Goal: Hypertension Monitored Completed 02/22/2021  Start Date: 09/22/2020  Expected End Date: 02/08/2021  Recent Progress: On track  Priority: High  Note:   Objective:  Last practice recorded BP readings:  BP Readings from Last 3 Encounters:  10/06/20 (!) 166/79  07/06/20 (!) 186/90  05/20/20 140/72   Most recent eGFR/CrCl: No results found for: EGFR  No components found for: CRCL  Current Barriers:  Knowledge Deficits related to basic understanding of hypertension pathophysiology and self care management-Ms. Andrus is checking her BP daily, reading today 149/93. She cooks most of her food and likes to eat fruits and vegetables. She has all of her  medications and is taking as prescribed. Difficulty obtaining medications Non-adherence to prescribed medication regimen Financial Constraints.  Does not contact provider office for questions/concerns Resolving due to duplicate goal Case Manager Clinical Goal(s):  patient will verbalize understanding of plan for hypertension management patient will attend all scheduled medical appointments: 10/06/20 with new PCP patient will demonstrate improved adherence to prescribed treatment plan for hypertension as evidenced by taking all medications as prescribed, monitoring and recording blood pressure as directed, adhering to low sodium/DASH diet Interventions:  Provided education to patient re: stroke prevention, s/s of heart attack and stroke, DASH diet, complications of uncontrolled blood pressure Reviewed medications with patient and discussed importance of compliance Discussed plans with patient for ongoing care management follow up and provided patient with direct contact information for care management team Reviewed scheduled/upcoming provider appointments including: Physical Therapy, PCP and [facility]  Patient Goals/Self-Care Activities:  - Self administers medications as prescribed - Attends all scheduled provider appointments - Calls provider office for new concerns, questions, or BP outside discussed parameters - Checks BP and records as discussed - Follows a low sodium diet/DASH diet - increase exercise to 30 min/day for 5 days a week  RNCM will follow up with a telephone visit on 02/08/21 @ 3:45pm      Care Plan : [place] of Care  Updates made by Melissa Montane, RN since 02/22/2021 12:00 AM     Problem: Chronic health management needs related to chronic pain and HTN      Long-Range Goal: Development of Plan of Care to address health management needs related to chronic pain and HTN   Start Date: 02/22/2021  Expected End Date: 05/23/2021  Priority: High  Note:   Current  Barriers:  Chronic Disease Management support and education needs related to HTN and chronic pain  RNCM Clinical Goal(s):  Patient will verbalize understanding of plan for management of HTN and chronic pain as evidenced by patient verbalization and self monitoring activities take all medications exactly as prescribed and will call provider for medication related questions as evidenced by documentation in EMR    attend all scheduled medical appointments: 04/19/21 @ [facility] and 07/07/21 with PCP as evidenced by provider documentation in EMR        continue to work with RN Care Manager and/or Social Worker to  address care management and care coordination needs related to HTN and chronic pain as evidenced by adherence to CM Team Scheduled appointments     through collaboration with RN Care manager, provider, and care team.   Interventions: Inter-disciplinary care team collaboration (see longitudinal plan of care) Evaluation of current treatment plan related to  self management and patient's adherence to plan as established by provider   Hypertension: (Status: New goal.) Long Term Goal Last practice recorded BP readings:  BP Readings from Last 3 Encounters:  01/06/21 135/65  10/06/20 (!) 166/79  07/06/20 (!) 186/90  Most recent eGFR/CrCl:  Lab Results  Component Value Date   EGFR 87 01/06/2021    No components found for: CRCL  Evaluation of current treatment plan related to hypertension self management and patient's adherence to plan as established by provider;   Provided education to patient re: stroke prevention, s/s of heart attack and stroke; Reviewed prescribed diet dash Reviewed medications with patient and discussed importance of compliance;  Discussed plans with patient for ongoing care management follow up and provided patient with direct contact information for care management team; Advised patient, providing education and rationale, to monitor blood pressure daily and record, calling  PCP for findings outside established parameters;  Assessed social determinant of health barriers;   Pain:  (Status: New goal.) Long Term Goal  Pain assessment performed Medications reviewed Reviewed provider established plan for pain management; Discussed importance of adherence to all scheduled medical appointments; Counseled on the importance of reporting any/all new or changed pain symptoms or management strategies to pain management provider; Reviewed with patient prescribed pharmacological and nonpharmacological pain relief strategies; Advised patient to discuss pain management with provider; Advised to schedule a follow up with Dr. Rosiland Oz  Patient Goals/Self-Care Activities: Take medications as prescribed   Attend all scheduled provider appointments Call pharmacy for medication refills 3-7 days in advance of running out of medications Call provider office for new concerns or questions  check blood pressure daily write blood pressure results in a log or diary keep a blood pressure log take blood pressure log to all doctor appointments develop an action plan for high blood pressure take medications for blood pressure exactly as prescribed eat more whole grains, fruits and vegetables, lean meats and healthy fats

## 2021-03-24 ENCOUNTER — Other Ambulatory Visit: Payer: Self-pay | Admitting: *Deleted

## 2021-03-24 NOTE — Patient Outreach (Signed)
Care Coordination  03/24/2021  [name] Aug 01, 1965 [id number]   Medicaid Managed Care   Unsuccessful Outreach Note  03/24/2021 Name: [name] MRN: [id number] DOB: [birth year]  Referred by: Vevelyn Francois, NP Reason for referral : High Risk Managed Medicaid (Unsuccessful RNCM follow up telephone outreach)   An unsuccessful telephone outreach was attempted today. The patient was referred to the case management team for assistance with care management and care coordination.   Follow Up Plan: A HIPAA compliant phone message was left for the patient providing contact information and requesting a return call.   Lurena Joiner RN, BSN [place] RN Care Coordinator

## 2021-03-24 NOTE — Patient Instructions (Signed)
Visit Information  Ms. Violet Seabury  - as a part of your Medicaid benefit, you are eligible for care management and care coordination services at no cost or copay. I was unable to reach you by phone today but would be happy to help you with your health related needs. Please feel free to call me @ [phone].   A member of the Managed Medicaid care management team will reach out to you again over the next 14 days.   Lurena Joiner RN, BSN [place] RN Care Coordinator

## 2021-04-05 ENCOUNTER — Other Ambulatory Visit: Payer: Self-pay | Admitting: *Deleted

## 2021-04-05 NOTE — Patient Instructions (Signed)
Visit Information  Brittany [name]  - as a part of your Medicaid benefit, you are eligible for care management and care coordination services at no cost or copay. I was unable to reach you by phone today but would be happy to help you with your health related needs. Please feel free to call me @ [phone].   A member of the Managed Medicaid care management team will reach out to you again over the next 14 days.   Lurena Joiner RN, BSN [name] Care Coordinator

## 2021-04-05 NOTE — Patient Outreach (Signed)
Care Coordination  04/05/2021  Brittany [name] [birth year] [id number]   Medicaid Managed Care   Unsuccessful Outreach Note  04/05/2021 Name: Brittany [name] MRN: [id number] DOB: 1965/08/10  Referred by: Vevelyn Francois, NP Reason for referral : High Risk Managed Medicaid (Unsuccessful RNCM follow up telephone outreach)   An unsuccessful telephone outreach was attempted today. The patient was referred to the case management team for assistance with care management and care coordination.   Follow Up Plan: A HIPAA compliant phone message was left for the patient providing contact information and requesting a return call.   Lurena Joiner RN, BSN [place] RN Care Coordinator

## 2021-04-13 ENCOUNTER — Other Ambulatory Visit: Payer: Self-pay | Admitting: *Deleted

## 2021-04-13 NOTE — Patient Outreach (Signed)
Care Coordination  04/13/2021  Marlet Korte 1965/09/25 [id number]   Medicaid Managed Care   Unsuccessful Outreach Note  04/13/2021 Name: Angellee Cohill MRN: [id number] DOB: 1966-01-25  Referred by: Vevelyn Francois, NP Reason for referral : High Risk Managed Medicaid (Unsuccessful RNCM follow up telephone outreach)   A second unsuccessful telephone outreach was attempted today. The patient was referred to the case management team for assistance with care management and care coordination.   Follow Up Plan: A HIPAA compliant phone message was left for the patient providing contact information and requesting a return call.   Lurena Joiner RN, BSN [place] RN Care Coordinator

## 2021-04-13 NOTE — Patient Instructions (Signed)
Visit Information  Ms. Kalyse Meharg  - as a part of your Medicaid benefit, you are eligible for care management and care coordination services at no cost or copay. I was unable to reach you by phone today but would be happy to help you with your health related needs. Please feel free to call me @ [phone].   A member of the Managed Medicaid care management team will reach out to you again over the next 14 days.   Lurena Joiner RN, BSN [place] RN Care Coordinator

## 2021-04-19 ENCOUNTER — Other Ambulatory Visit: Payer: Self-pay

## 2021-04-19 ENCOUNTER — Ambulatory Visit (INDEPENDENT_AMBULATORY_CARE_PROVIDER_SITE_OTHER): Payer: Medicaid Other | Admitting: Podiatry

## 2021-04-19 DIAGNOSIS — M79674 Pain in right toe(s): Secondary | ICD-10-CM

## 2021-04-19 DIAGNOSIS — G629 Polyneuropathy, unspecified: Secondary | ICD-10-CM

## 2021-04-19 DIAGNOSIS — M79675 Pain in left toe(s): Secondary | ICD-10-CM | POA: Diagnosis not present

## 2021-04-19 DIAGNOSIS — B351 Tinea unguium: Secondary | ICD-10-CM

## 2021-04-19 MED ORDER — AMMONIUM LACTATE 12 % EX LOTN: 1.0000 "application " | TOPICAL_LOTION | CUTANEOUS | 0 refills | Status: DC | PRN

## 2021-04-19 MED ORDER — NYSTATIN-TRIAMCINOLONE 100000-0.1 UNIT/GM-% EX OINT: 1.0000 "application " | TOPICAL_OINTMENT | Freq: Two times a day (BID) | CUTANEOUS | 0 refills | Status: DC

## 2021-04-25 NOTE — Progress Notes (Signed)
Subjective: 56 year old female presents the office today for follow-up evaluation of thick, elongated toenails that she cannot trim her self.  She been using medication and nails, nail gel without significant improvement.  Denies any swelling or redness of the toenail sites.  She does complain of right foot pain most of the big toe on the plantar aspect.  Describes a stabbing sensation.  No open sores.  No injury.  No fevers or chills that she reports.  Objective: AAO x3, NAD DP/PT pulses palpable bilaterally, CRT less than 3 seconds Nails are hypertrophic, dystrophic, brittle, discolored, elongated 10. No surrounding redness or drainage. Tenderness nails 1-5 bilaterally. No open lesions or pre-ulcerative lesions are identified today. Hypertrophic lesions medial hallux bilateral.  No underlying ulceration drainage or signs of infection. There is no sign of tenderness palpation along course or insertion of plantar fascia today. No pain with calf compression, swelling, warmth, erythema  Assessment: Symptomatic onychomycosis, hyperkeratotic lesions, neuropathy  Plan: -All treatment options discussed with the patient including all alternatives, risks, complications.  -She will be debrided nails x10 without any complications or bleeding.  Continue with the topical medication. -Sharp debrided hyperkeratotic lesions x2 without any complications or bleeding as a courtesy.  There is minimal tissue. -The stabbing pain is more from likely neuropathy.  No injury or swelling or any open sores.  Continue gabapentin. -History of plantar fasciitis.  Continue stretching, icing as well as using good arch support. -Patient encouraged to call the office with any questions, concerns, change in symptoms.

## 2021-04-26 ENCOUNTER — Encounter: Payer: Self-pay | Admitting: Family Medicine

## 2021-04-26 ENCOUNTER — Ambulatory Visit: Payer: Self-pay

## 2021-04-26 ENCOUNTER — Ambulatory Visit (INDEPENDENT_AMBULATORY_CARE_PROVIDER_SITE_OTHER): Payer: Medicaid Other | Admitting: Family Medicine

## 2021-04-26 VITALS — BP 198/104 | Ht 61.0 in | Wt 156.0 lb

## 2021-04-26 DIAGNOSIS — M24111 Other articular cartilage disorders, right shoulder: Secondary | ICD-10-CM

## 2021-04-26 MED ORDER — TRIAMCINOLONE ACETONIDE 40 MG/ML IJ SUSP
40.0000 mg | Freq: Once | INTRAMUSCULAR | Status: AC
  Administered 2021-04-26: 40 mg via INTRA_ARTICULAR

## 2021-04-26 NOTE — Progress Notes (Signed)
Brittany [name] - 55 y.o. female MRN [id number]  Date of birth: 1965-11-17  SUBJECTIVE:  Including CC & ROS.  No chief complaint on file.   Brittany [name] is a 56 y.o. female that is presenting with acute on chronic right shoulder pain.  The pain is been worse over the past few weeks.  No injury inciting event.   Review of Systems See HPI   HISTORY: Past Medical, Surgical, Social, and Family History Reviewed & Updated per EMR.   Pertinent Historical Findings include:  Past Medical History:  Diagnosis Date   Anemia    Arthritis    trigger finger in left hand   Complication of anesthesia    per pt, hard to wake up!   Elevated cholesterol    Female bladder prolapse    per urologist, does not have prolaspe   GERD (gastroesophageal reflux disease)    Heart murmur    pt unsure.   Hiatal hernia    Hypertension    IBS (irritable bowel syndrome)    Multiple myeloma ([place]) 2005   had partial chemo   Peripheral neuropathy    SOB (shortness of breath) on exertion    uses an inhaler   Stroke [facility]) 2017   paralysis left arm/uses a walker    Past Surgical History:  Procedure Laterality Date   BONE BIOPSY  2005   in her back   COLONOSCOPY     over 10 years x3   ESOPHAGOGASTRODUODENOSCOPY     incomplete-over 10 years ago    TEE WITHOUT CARDIOVERSION N/A 12/08/2016   Procedure: TRANSESOPHAGEAL ECHOCARDIOGRAM (TEE);  Surgeon: Sueanne Margarita, MD;  Location: [facility] ENDOSCOPY;  Service: Cardiovascular;  Laterality: N/A;   TUBAL LIGATION     UPPER GASTROINTESTINAL ENDOSCOPY     WISDOM TOOTH EXTRACTION       PHYSICAL EXAM:  VS: BP (!) 198/104 (BP Location: Left Arm, Patient Position: Sitting)    Ht 5' 1"  (1.549 m)    Wt 156 lb (70.8 kg)    BMI 29.48 kg/m  Physical Exam Gen: NAD, alert, cooperative with exam, well-appearing MSK:  Neurovascularly intact    Limited ultrasound: Right shoulder:  Effusion over the subscapularis. Effusion within the bicipital tendon  sheath. Subacromial bursitis is present  Summary: Effusion around the shoulder joint.  Ultrasound and interpretation by Clearance Coots, MD   Aspiration/Injection Procedure Note Brittany [name] 04-01-1965  Procedure: Injection Indications: Right shoulder pain  Procedure Details Consent: Risks of procedure as well as the alternatives and risks of each were explained to the (patient/caregiver).  Consent for procedure obtained. Time Out: Verified patient identification, verified procedure, site/side was marked, verified correct patient position, special equipment/implants available, medications/allergies/relevent history reviewed, required imaging and test results available.  Performed.  The area was cleaned with iodine and alcohol swabs.    The right glenohumeral joint was injected using 3 cc of 1% lidocaine on a 22-gauge 3-1/2 inch needle.  The syringe was switched and a mixture containing  1 cc's of 40 mg Kenalog and 4 cc's of 0.25% bupivacaine was injected.  Ultrasound was used. Images were obtained in short views showing the injection.     A sterile dressing was applied.  Patient did tolerate procedure well.    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  Previous imaging has been unrevealing and has been under physician directed home exercise therapy for longer than 6 weeks.  We have tried multiple injections  and medications previously. -Counseled on home exercise therapy and supportive care. -Injection today. -MRI of the right shoulder to evaluate for a labral tear and for presurgical planning.

## 2021-04-26 NOTE — Assessment & Plan Note (Signed)
Acute on chronic in nature.  Previous imaging has been unrevealing and has been under physician directed home exercise therapy for longer than 6 weeks.  We have tried multiple injections and medications previously. -Counseled on home exercise therapy and supportive care. -Injection today. -MRI of the right shoulder to evaluate for a labral tear and for presurgical planning.

## 2021-04-26 NOTE — Patient Instructions (Signed)
Good to see you  Please try ice  Please call [phone] to schedule the mri  Please send me a message in MyChart with any questions or updates.  We'll setup a virtual visit once the MRi is resulted.   --Dr. Raeford Razor

## 2021-05-04 ENCOUNTER — Telehealth: Payer: Self-pay | Admitting: Podiatry

## 2021-05-04 ENCOUNTER — Other Ambulatory Visit: Payer: Self-pay | Admitting: Podiatry

## 2021-05-04 MED ORDER — EFINACONAZOLE 10 % EX SOLN: 1.0000 [drp] | Freq: Every day | CUTANEOUS | 11 refills | Status: DC

## 2021-05-04 NOTE — Progress Notes (Signed)
Jublia ordered 

## 2021-05-04 NOTE — Telephone Encounter (Signed)
Patient is calling back the pharmacy said they contacted our office about prescription not being covered by insurance and she will need to have another prescription with something else called in?  Please advise   Medication :nystatin-triamcinolone ointment [facility]) [[id number]]   Pharmacy : [facility] in [place]

## 2021-05-04 NOTE — Telephone Encounter (Signed)
Lmom for patient to call back - also left information about medication being called into pharmacy

## 2021-05-11 ENCOUNTER — Telehealth: Payer: Self-pay | Admitting: Family Medicine

## 2021-05-11 ENCOUNTER — Other Ambulatory Visit: Payer: Self-pay

## 2021-05-11 ENCOUNTER — Other Ambulatory Visit: Payer: Medicaid Other | Admitting: *Deleted

## 2021-05-11 NOTE — Patient Outreach (Signed)
Care Coordination  05/11/2021  Jovee Dettinger May 18, 1965 [id number]   Medicaid Managed Care   Unsuccessful Outreach Note  05/11/2021 Name: Eydie Wormley MRN: [id number] DOB: 1966/03/01  Referred by: Vevelyn Francois, NP Reason for referral : Case Closure (RNCM performing Case Closure for 3 or more unsuccessful outreach attempts)   Three or more unsuccessful telephone outreach attempts have been made. The patient was referred to the case management team for assistance with care management and care coordination. The patient's primary care provider has been notified of our unsuccessful attempts to make or maintain contact with the patient. The care management team is pleased to engage with this patient at any time in the future should he/she be interested in assistance from the care management team.   Follow Up Plan: We have been unable to make contact with the patient for follow up. The care management team is available to follow up with the patient after provider conversation with the patient regarding recommendation for care management engagement and subsequent re-referral to the care management team.   Lurena Joiner RN, BSN [place] RN Care Coordinator

## 2021-05-11 NOTE — Telephone Encounter (Signed)
Patient called states she received a denial letter from [facility]) stating MRI denied for lack of supporting medical information .  --Forwarding message to medical asst for review & submission of requested med recs to [facility].  --glh

## 2021-05-11 NOTE — Telephone Encounter (Signed)
°  05/11/21  [3:15 PM] Hartsell, Jazmin I just checked online today and don't see an update.  I submitted all the notes and previous imaging last week to her insurance to see if they would accept that as an appeal.  The website said they have up to 30 days to give their final decision.  The initial request on the [facility] website didn't ask for notes and then it was denied, so i faxed everything over and im just waiting to hear or see an update

## 2021-05-13 ENCOUNTER — Other Ambulatory Visit: Payer: Self-pay | Admitting: Podiatry

## 2021-05-13 MED ORDER — TAVABOROLE 5 % EX SOLN: 1.0000 "application " | Freq: Every day | CUTANEOUS | 2 refills | Status: DC

## 2021-05-13 NOTE — Telephone Encounter (Signed)
Please advise 

## 2021-05-13 NOTE — Telephone Encounter (Signed)
Called and spoke with patient, giving recommendations about prescription, she said that the medicine(50.00) is too expensive, fixed income. Are there anything other recommendations?

## 2021-05-13 NOTE — Progress Notes (Signed)
Sent tavaborole as jublia not covered.

## 2021-05-13 NOTE — Telephone Encounter (Signed)
Patient notified

## 2021-06-14 ENCOUNTER — Other Ambulatory Visit: Payer: Self-pay | Admitting: Nurse Practitioner

## 2021-06-23 ENCOUNTER — Ambulatory Visit: Payer: Medicaid Other

## 2021-06-23 ENCOUNTER — Ambulatory Visit (INDEPENDENT_AMBULATORY_CARE_PROVIDER_SITE_OTHER): Payer: Medicaid Other | Admitting: Podiatry

## 2021-06-23 DIAGNOSIS — M79674 Pain in right toe(s): Secondary | ICD-10-CM

## 2021-06-23 DIAGNOSIS — B351 Tinea unguium: Secondary | ICD-10-CM | POA: Diagnosis not present

## 2021-06-23 DIAGNOSIS — M79675 Pain in left toe(s): Secondary | ICD-10-CM

## 2021-06-23 DIAGNOSIS — M722 Plantar fascial fibromatosis: Secondary | ICD-10-CM

## 2021-06-23 NOTE — Progress Notes (Signed)
SITUATION ?Reason for Consult: Follow-up with non-[facility]-provided AFO ?Patient / Caregiver Report: Patient did not bring her AFO today ? ?OBJECTIVE DATA ?History / Diagnosis:  ?  ICD-10-CM   ?1. Bilateral plantar fasciitis  M72.2   ?  ? ? ?Change in Pathology: Unknown ? ?ACTIONS PERFORMED ?Patient did not bring her AFO today and reports that she does not wear it due to pain in her toes. Informed patient that the practice that provided the AFO is responsible for its adjustment in the event that it does not perform adequately. Patient was not aware she could return to the practice that supplied it for modification. All questions answered and concerns addressed. ? ?PLAN ?Follow-up as needed (PRN). Plan of care discussed with and agreed upon by patient / caregiver. ? ?

## 2021-06-25 NOTE — Progress Notes (Signed)
Subjective: ?56 year old female presents the office today for follow-up evaluation of thick, elongated toenails that she cannot trim herself.  She also gets ingrown toenail on the right lateral nail border which causes discomfort.  No swelling or redness or any drainage to the toenail sites.  Also she has calluses on the bottoms of both of her big toes which cause discomfort.  No open sores that she reports no swelling or redness or any drainage.  No other concerns. ? ?Objective: ?AAO x3, NAD ?DP/PT pulses palpable bilaterally, CRT less than 3 seconds ?Nails are hypertrophic, dystrophic, brittle, discolored, elongated ?10. No surrounding redness or drainage. Tenderness nails 1-5 bilaterally.  ?Incurvation present along the lateral aspect of the right hallux toenail with tenderness palpation of the distal portion of the nail.  No edema, erythema or signs of infection. ?Hypertrophic lesions medial hallux bilateral.  No underlying ulceration drainage or signs of infection. ?There is no sign of tenderness palpation along course or insertion of plantar fascia today. ?No pain with calf compression, swelling, warmth, erythema ? ?Assessment: ?Symptomatic onychomycosis, ingrown toenail right hallux, hyperkeratotic lesions, neuropathy ? ?Plan: ?-All treatment options discussed with the patient including all alternatives, risks, complications.  ?-She will be debrided nails x10 without any complications or bleeding.  Continue with the topical medication. ?-For the ingrown toenail discussed partial nail avulsion but she was to hold off on this.  Was able to sharply debride the nail without any complications or bleeding.  Should symptoms continue or worsen will need to proceed with this and she will think about it. ?-Sharp debrided hyperkeratotic lesions x2 without any complications or bleeding as a courtesy.  There is minimal tissue. ?-The stabbing pain is more from likely neuropathy.  No injury or swelling or any open sores.   Continue gabapentin. ?-Patient encouraged to call the office with any questions, concerns, change in symptoms.  ? ?Trula Slade DPM ? ? ?

## 2021-07-07 ENCOUNTER — Ambulatory Visit: Payer: Medicaid Other | Admitting: Nurse Practitioner

## 2021-07-11 ENCOUNTER — Ambulatory Visit: Payer: Medicaid Other | Admitting: Nurse Practitioner

## 2021-08-10 ENCOUNTER — Other Ambulatory Visit: Payer: Self-pay | Admitting: Nurse Practitioner

## 2021-08-10 DIAGNOSIS — R609 Edema, unspecified: Secondary | ICD-10-CM

## 2021-08-25 ENCOUNTER — Ambulatory Visit: Payer: Medicaid Other | Admitting: Podiatry

## 2021-10-04 ENCOUNTER — Other Ambulatory Visit: Payer: Self-pay | Admitting: Nurse Practitioner

## 2021-10-04 DIAGNOSIS — R609 Edema, unspecified: Secondary | ICD-10-CM

## 2021-11-07 ENCOUNTER — Other Ambulatory Visit: Payer: Self-pay | Admitting: Nurse Practitioner

## 2021-12-01 ENCOUNTER — Encounter: Payer: Self-pay | Admitting: Nurse Practitioner

## 2021-12-01 ENCOUNTER — Ambulatory Visit (INDEPENDENT_AMBULATORY_CARE_PROVIDER_SITE_OTHER): Payer: Medicaid Other | Admitting: Nurse Practitioner

## 2021-12-01 VITALS — BP 159/79 | HR 86 | Temp 98.2°F | Ht 61.0 in | Wt 171.0 lb

## 2021-12-01 DIAGNOSIS — I10 Essential (primary) hypertension: Secondary | ICD-10-CM | POA: Diagnosis not present

## 2021-12-01 DIAGNOSIS — K589 Irritable bowel syndrome without diarrhea: Secondary | ICD-10-CM | POA: Insufficient documentation

## 2021-12-01 MED ORDER — LINACLOTIDE 72 MCG PO CAPS: 72.0000 ug | ORAL_CAPSULE | Freq: Every day | ORAL | 0 refills | Status: DC

## 2021-12-01 NOTE — Progress Notes (Signed)
@Patient  ID: Brittany [name], female    DOB: [birth year], 56 y.o.   MRN: [id number]  Chief Complaint  Patient presents with   Follow-up   Irritable Bowel Syndrome    Pt is here for 6 month's follow up. Pt states she has been having issues with her IBS, cramping, swelling, gas, nausea    Referring provider: Vevelyn Francois, NP   HPI  Brittany [name] presents for follow up. She  has a past medical history of Anemia, Arthritis, Complication of anesthesia, Elevated cholesterol, Female bladder prolapse, GERD (gastroesophageal reflux disease), Heart murmur, Hiatal hernia, Hypertension, IBS (irritable bowel syndrome), Multiple myeloma ([place]) (2005), Peripheral neuropathy, SOB (shortness of breath) on exertion, and Stroke ([place]) (2017).   She was seen to follow up for her HTN and prediabetes. She has not been compliant with her medications. Currently on Norvasc and lasix. Also on nifedipine. States that she has been having a lot of issues with constipation due to IBS. Would like referral to GI.   Blood pressure elevated. Just recently took blood pressure medications.   Allergies  Allergen Reactions   Albuterol Swelling and Palpitations   Amitriptyline Other (See Comments)    Patient reported that it made her throat feel like its locking up and it also caused issues with her going to the bathroom   Duloxetine Other (See Comments)    Patient reported that it made her throat lock up and it caused her to have issue with going to the bathroom   Naproxen     Vomiting, sweating, abd spasms   Other     States can't take pain meds that end in "cet" or meds that end in "ine" Darvocet/severe vomiting   Darvon [Propoxyphene] Nausea And Vomiting   Hydrocodone Nausea And Vomiting   Lactose Intolerance (Gi)     Bloating, gas, abd pain   Latex Itching and Rash   Oxycodone Nausea And Vomiting   Percocet [Oxycodone-Acetaminophen] Nausea And Vomiting   Topamax [Topiramate]     Memory made her emotional       There is no immunization history on file for this patient.  Past Medical History:  Diagnosis Date   Anemia    Arthritis    trigger finger in left hand   Complication of anesthesia    per pt, hard to wake up!   Elevated cholesterol    Female bladder prolapse    per urologist, does not have prolaspe   GERD (gastroesophageal reflux disease)    Heart murmur    pt unsure.   Hiatal hernia    Hypertension    IBS (irritable bowel syndrome)    Multiple myeloma ([place]) 2005   had partial chemo   Peripheral neuropathy    SOB (shortness of breath) on exertion    uses an inhaler   Stroke ([place]) 2017   paralysis left arm/uses a walker    Tobacco History: Social History   Tobacco Use  Smoking Status Former   Types: Cigarettes   Quit date: 12/06/2015   Years since quitting: 5.9  Smokeless Tobacco Never   Counseling given: Not Answered   Outpatient Encounter Medications as of 12/01/2021  Medication Sig   albuterol (VENTOLIN HFA) 108 (90 Base) MCG/ACT inhaler Inhale 2 puffs into the lungs every 4 (four) hours as needed for wheezing or shortness of breath.   ammonium lactate (AMLACTIN) 12 % lotion Apply 1 application topically as needed for dry skin.   aspirin 325 MG tablet Take 1 tablet (325 mg  total) by mouth daily.   atorvastatin (LIPITOR) 40 MG tablet Take 1 tablet (40 mg total) by mouth daily.   docusate sodium (COLACE) 100 MG capsule Take 200 mg by mouth 2 (two) times daily.   Ergocalciferol (VITAMIN D2) 10 MCG (400 UNIT) TABS Take 10 mcg by mouth daily.   furosemide (LASIX) 20 MG tablet TAKE 1 TABLET (20 MG TOTAL) BY MOUTH 2 (TWO) TIMES DAILY AS NEEDED.   gabapentin (NEURONTIN) 300 MG capsule Take 1 capsule (300 mg total) by mouth 3 (three) times daily.   hydrOXYzine (VISTARIL) 50 MG capsule TAKE 1 TO 2 CAPSULES (50 TO 100 MG TOTAL) BY MOUTH 3 (THREE) TIMES DAILY AS NEEDED FOR ITCHING.   linaclotide (LINZESS) 72 MCG capsule Take 1 capsule (72 mcg total) by mouth daily before  breakfast.   methocarbamol (ROBAXIN) 500 MG tablet TAKE 1 TABLET (500 MG TOTAL) BY MOUTH 3 (THREE) TIMES DAILY.   Multiple Vitamin (MULTIVITAMIN) capsule Take 4 capsules by mouth 2 (two) times daily. Centrum plus   NIFEdipine (PROCARDIA-XL/NIFEDICAL-XL) 30 MG 24 hr tablet TAKE 1 TABLET (30 MG TOTAL) BY MOUTH 2 (TWO) TIMES DAILY.   triamcinolone cream (KENALOG) 0.1 % APPLY 1 APPLICATION TOPICALLY 2 (TWO) TIMES DAILY.   [DISCONTINUED] ammonium lactate (AMLACTIN) 12 % lotion Apply 1 application topically as needed for dry skin.   amLODipine (NORVASC) 10 MG tablet Take 1 tablet (10 mg total) by mouth daily. (Patient not taking: Reported on 12/01/2021)   ciclopirox (PENLAC) 8 % solution APPLY TOPICALLY AT BEDTIME. APPLY OVER NAIL AND SURROUNDING SKIN. APPLY DAILY OVER PREVIOUS COAT. AFTER SEVEN (7) DAYS, MAY REMOVE WITH ALCOHOL AND CONTINUE CYCLE.   cyanocobalamin 1000 MCG tablet Take 1 tablet (1,000 mcg total) by mouth daily.   Diclofenac Sodium (PENNSAID EX) Apply topically. (Patient not taking: Reported on 12/01/2021)   dimenhyDRINATE (DRAMAMINE) 50 MG tablet Take 50 mg by mouth every 8 (eight) hours as needed for dizziness. (Patient not taking: Reported on 02/22/2021)   omeprazole (PRILOSEC) 20 MG capsule Take 1 capsule (20 mg total) by mouth 2 (two) times daily before a meal.   Tavaborole 5 % SOLN Apply 1 application topically daily. (Patient not taking: Reported on 12/01/2021)   No facility-administered encounter medications on file as of 12/01/2021.     Review of Systems  Review of Systems  Constitutional: Negative.   HENT: Negative.    Cardiovascular: Negative.   Gastrointestinal:  Positive for constipation.  Allergic/Immunologic: Negative.   Neurological: Negative.   Psychiatric/Behavioral: Negative.         Physical Exam  BP (!) 162/91 (BP Location: Right Arm, Patient Position: Sitting, Cuff Size: Large)   Pulse 86   Temp 98.2 F (36.8 C)   Ht 5' 1"  (1.549 m)   Wt 171 lb (77.6  kg)   SpO2 100%   BMI 32.31 kg/m   Wt Readings from Last 5 Encounters:  12/01/21 171 lb (77.6 kg)  04/26/21 156 lb (70.8 kg)  01/06/21 156 lb (70.8 kg)  10/06/20 142 lb 0.8 oz (64.4 kg)  07/06/20 136 lb (61.7 kg)     Physical Exam Vitals and nursing note reviewed.  Constitutional:      General: She is not in acute distress.    Appearance: She is well-developed.  Cardiovascular:     Rate and Rhythm: Normal rate and regular rhythm.  Pulmonary:     Effort: Pulmonary effort is normal.     Breath sounds: Normal breath sounds.  Neurological:  Mental Status: She is alert and oriented to person, place, and time.      Lab Results:  CBC    Component Value Date/Time   WBC 5.9 02/04/2019 1450   WBC 8.6 10/15/2018 1437   RBC 5.17 02/04/2019 1450   RBC 5.25 (H) 10/15/2018 1437   HGB 14.5 02/04/2019 1450   HCT 43.3 02/04/2019 1450   PLT 289 02/04/2019 1450   MCV 84 02/04/2019 1450   MCH 28.0 02/04/2019 1450   MCH 27.0 10/15/2018 1437   MCHC 33.5 02/04/2019 1450   MCHC 33.3 10/15/2018 1437   RDW 14.1 02/04/2019 1450   LYMPHSABS 2.4 02/04/2019 1450   MONOABS 0.7 10/15/2018 1437   EOSABS 0.1 02/04/2019 1450   BASOSABS 0.0 02/04/2019 1450    BMET    Component Value Date/Time   NA 145 (H) 01/06/2021 1456   K 4.6 01/06/2021 1456   CL 108 (H) 01/06/2021 1456   CO2 22 02/04/2019 1450   GLUCOSE 87 01/06/2021 1456   GLUCOSE 139 (H) 10/15/2018 1437   BUN 6 01/06/2021 1456   CREATININE 0.80 01/06/2021 1456   CALCIUM 9.3 01/06/2021 1456   GFRNONAA 74 02/04/2019 1450   GFRAA 86 02/04/2019 1450    BNP No results found for: "BNP"  ProBNP No results found for: "PROBNP"  Imaging: No results found.   Assessment & Plan:   Irritable bowel syndrome - Ambulatory referral to Gastroenterology - linaclotide (LINZESS) 72 MCG capsule; Take 1 capsule (72 mcg total) by mouth daily before breakfast.  Dispense: 30 capsule; Refill: 0 - Lipid Panel - CBC - Comprehensive  metabolic panel  2. Hypertension, unspecified type  - Lipid Panel - CBC - Comprehensive metabolic panel - AMB Referral to Pharmacy Medication Management  AVS was discussed with patient. Patient was asked if there were any other questions or concerns today. Patient stated there were no other questions or concerns. Patient was advised that they needed lab work and then could check out and schedule follow up visit. Patient voiced understanding. Appointment was completed.   Patient states that all issues and concerns were addressed during visit today.   Follow up:  Follow up in 6 months or sooner if needed       Fenton Foy, NP 12/01/2021

## 2021-12-01 NOTE — Assessment & Plan Note (Signed)
-   Ambulatory referral to Gastroenterology - linaclotide (LINZESS) 72 MCG capsule; Take 1 capsule (72 mcg total) by mouth daily before breakfast.  Dispense: 30 capsule; Refill: 0 - Lipid Panel - CBC - Comprehensive metabolic panel  2. Hypertension, unspecified type  - Lipid Panel - CBC - Comprehensive metabolic panel - AMB Referral to Pharmacy Medication Management  AVS was discussed with patient. Patient was asked if there were any other questions or concerns today. Patient stated there were no other questions or concerns. Patient was advised that they needed lab work and then could check out and schedule follow up visit. Patient voiced understanding. Appointment was completed.   Patient states that all issues and concerns were addressed during visit today.   Follow up:  Follow up in 6 months or sooner if needed

## 2021-12-01 NOTE — Patient Instructions (Signed)
1. Irritable bowel syndrome, unspecified type  - Ambulatory referral to Gastroenterology - linaclotide (LINZESS) 72 MCG capsule; Take 1 capsule (72 mcg total) by mouth daily before breakfast.  Dispense: 30 capsule; Refill: 0 - Lipid Panel - CBC - Comprehensive metabolic panel  2. Hypertension, unspecified type  - Lipid Panel - CBC - Comprehensive metabolic panel - AMB Referral to Pharmacy Medication Management  AVS was discussed with patient. Patient was asked if there were any other questions or concerns today. Patient stated there were no other questions or concerns. Patient was advised that they needed lab work and then could check out and schedule follow up visit. Patient voiced understanding. Appointment was completed.   Patient states that all issues and concerns were addressed during visit today.   Follow up:  Follow up in 6 months or sooner if needed

## 2021-12-02 LAB — COMPREHENSIVE METABOLIC PANEL
ALT: 12 IU/L (ref 0–32)
AST: 17 IU/L (ref 0–40)
Albumin/Globulin Ratio: 1.5 (ref 1.2–2.2)
Albumin: 4.1 g/dL (ref 3.8–4.9)
Alkaline Phosphatase: 130 IU/L — ABNORMAL HIGH (ref 44–121)
BUN/Creatinine Ratio: 9 (ref 9–23)
BUN: 8 mg/dL (ref 6–24)
Bilirubin Total: 0.5 mg/dL (ref 0.0–1.2)
CO2: 22 mmol/L (ref 20–29)
Calcium: 9.2 mg/dL (ref 8.7–10.2)
Chloride: 108 mmol/L — ABNORMAL HIGH (ref 96–106)
Creatinine, Ser: 0.94 mg/dL (ref 0.57–1.00)
Globulin, Total: 2.7 g/dL (ref 1.5–4.5)
Glucose: 84 mg/dL (ref 70–99)
Potassium: 4.2 mmol/L (ref 3.5–5.2)
Sodium: 147 mmol/L — ABNORMAL HIGH (ref 134–144)
Total Protein: 6.8 g/dL (ref 6.0–8.5)
eGFR: 71 mL/min/{1.73_m2} (ref >59)

## 2021-12-02 LAB — LIPID PANEL
Chol/HDL Ratio: 7.1 ratio — ABNORMAL HIGH (ref 0.0–4.4)
Cholesterol, Total: 213 mg/dL — ABNORMAL HIGH (ref 100–199)
HDL: 30 mg/dL — ABNORMAL LOW (ref >39)
LDL Chol Calc (NIH): 156 mg/dL — ABNORMAL HIGH (ref 0–99)
Triglycerides: 147 mg/dL (ref 0–149)
VLDL Cholesterol Cal: 27 mg/dL (ref 5–40)

## 2021-12-02 LAB — CBC
Hematocrit: 46.1 % (ref 34.0–46.6)
Hemoglobin: 15.1 g/dL (ref 11.1–15.9)
MCH: 28.6 pg (ref 26.6–33.0)
MCHC: 32.8 g/dL (ref 31.5–35.7)
MCV: 87 fL (ref 79–97)
Platelets: 164 10*3/uL (ref 150–450)
RBC: 5.28 x10E6/uL (ref 3.77–5.28)
RDW: 14.4 % (ref 11.7–15.4)
WBC: 4.9 10*3/uL (ref 3.4–10.8)

## 2021-12-07 ENCOUNTER — Telehealth: Payer: Self-pay | Admitting: Pharmacist

## 2021-12-07 NOTE — Progress Notes (Signed)
Contacted patient regarding referral for hypertension and diabetes from [place], Kenney Houseman .   Appointment scheduled   Catie Hedwig Morton, PharmD, [facility] [phone]

## 2021-12-09 ENCOUNTER — Other Ambulatory Visit: Payer: Self-pay | Admitting: Nurse Practitioner

## 2021-12-12 ENCOUNTER — Encounter: Payer: Self-pay | Admitting: Nurse Practitioner

## 2021-12-14 ENCOUNTER — Other Ambulatory Visit: Payer: Medicaid Other | Admitting: Pharmacist

## 2021-12-14 DIAGNOSIS — I10 Essential (primary) hypertension: Secondary | ICD-10-CM

## 2021-12-14 MED ORDER — HYDROCHLOROTHIAZIDE 12.5 MG PO CAPS: 12.5000 mg | ORAL_CAPSULE | Freq: Every day | ORAL | 1 refills | Status: DC

## 2021-12-14 MED ORDER — NIFEDIPINE ER OSMOTIC RELEASE 30 MG PO TB24: 30.0000 mg | ORAL_TABLET | Freq: Every day | ORAL | 1 refills | Status: DC

## 2021-12-14 NOTE — Progress Notes (Signed)
Chief Complaint  Patient presents with   Medication Management   Hypertension    Brittany [name] is a 56 y.o. year old female who presented for a telephone visit.   They were referred to the pharmacist by their PCP for assistance in managing hypertension.   Patient is participating in a Managed Medicaid Plan:  Yes  Subjective:  Care Team: Primary Care Provider: Vevelyn Francois, NP ; Next Scheduled Visit: Nils Pyle on 05/31/21  Medication Access/Adherence  Current Pharmacy:  [facility], [address] [place] [address] Phone: [phone] Fax: [phone]  [facility], [address] [place] [address] Phone: [phone] Fax: [phone]   Patient reports affordability concerns with their medications: No  Patient reports access/transportation concerns to their pharmacy: No  Patient reports adherence concerns with their medications:  No     Hypertension:  Current medications: nifedipine XL 30 mg twice daily, amlodipine 10 mg daily prescribed but patient has not been taking Medications previously tried: lisinopril - concern initially for associated rash, but appears rash continued even after medication was discontinued  Patient has a upper arm home BP cuff Current blood pressure readings readings: 122/95 last reading  Reports significant issues with constipation, edema, was unaware that these side effects could be related to calcium channel blockers. Denies history of diuretic or ARB.   Hyperlipidemia/ASCVD Risk Reduction  Current lipid lowering medications: atorvastatin 40 mg daily  Antiplatelet regimen: aspirin 325 mg daily  ASCVD History: hx CVA  Health Maintenance  Health Maintenance Due  Topic Date Due   COVID-19 Vaccine (1) Never done   PAP SMEAR-Modifier  Never done   Zoster Vaccines- Shingrix (1 of 2) Never  done   MAMMOGRAM  02/27/2021   COLONOSCOPY (Pts 45-12yr Insurance coverage will need to be confirmed)  09/01/2021   INFLUENZA VACCINE  Never done     Objective: Lab Results  Component Value Date   HGBA1C 5.9 (A) 01/06/2021   HGBA1C 5.9 01/06/2021   HGBA1C 5.9 01/06/2021   HGBA1C 5.9 01/06/2021    Lab Results  Component Value Date   CREATININE 0.94 12/01/2021   BUN 8 12/01/2021   NA 147 (H) 12/01/2021   K 4.2 12/01/2021   CL 108 (H) 12/01/2021   CO2 22 12/01/2021    Lab Results  Component Value Date   CHOL 213 (H) 12/01/2021   HDL 30 (L) 12/01/2021   LDLCALC 156 (H) 12/01/2021   TRIG 147 12/01/2021   CHOLHDL 7.1 (H) 12/01/2021    Medications Reviewed Today     Reviewed by HOsker Mason RPH-CPP (Pharmacist) on 12/14/21 at 1[facility] Status: <None>   Medication Order Taking? Sig Documenting Provider Last Dose Status Informant  albuterol (VENTOLIN HFA) 108 (90 Base) MCG/ACT inhaler [id number] Inhale 2 puffs into the lungs every 4 (four) hours as needed for wheezing or shortness of breath. KVevelyn Francois NP Taking Active   amLODipine (NORVASC) 10 MG tablet [id number] TAKE 1 TABLET (10 MG TOTAL) BY MOUTH DAILY.  Patient not taking: Reported on 12/14/2021   NFenton Foy NP Not Taking Active   aspirin 325 MG tablet [id number] Take 1 tablet (325 mg total) by mouth daily. MCristal Ford DO Taking Active Self  atorvastatin (LIPITOR) 40 MG tablet [id number] Take 1 tablet (40 mg total) by mouth daily. KVevelyn Francois NP Taking Active  ciclopirox (PENLAC) 8 % solution [id number] No APPLY TOPICALLY AT BEDTIME. APPLY OVER NAIL AND SURROUNDING SKIN. APPLY DAILY OVER PREVIOUS COAT. AFTER SEVEN (7) DAYS, MAY REMOVE WITH ALCOHOL AND CONTINUE CYCLE.  Patient not taking: Reported on 12/14/2021   Trula Slade, DPM Not Taking Active   cyanocobalamin 1000 MCG tablet [id number] Yes Take 1 tablet (1,000 mcg total) by mouth daily. Azzie Glatter, FNP Taking  Active   docusate sodium (COLACE) 100 MG capsule [id number] Yes Take 200 mg by mouth 2 (two) times daily. [provider] Taking Active Self  Ergocalciferol (VITAMIN D2) 10 MCG (400 UNIT) TABS [id number] Yes Take 10 mcg by mouth daily. Azzie Glatter, FNP Taking Active   furosemide (LASIX) 20 MG tablet [id number] Yes TAKE 1 TABLET (20 MG TOTAL) BY MOUTH 2 (TWO) TIMES DAILY AS NEEDED. Dorena Dew, FNP Taking Active            Med Note Jodi Mourning, [place] Dec 14, 2021  2:33 PM) Using a few times monthly  gabapentin (NEURONTIN) 300 MG capsule [id number] Yes Take 1 capsule (300 mg total) by mouth 3 (three) times daily. Vevelyn Francois, NP Taking Active   hydrOXYzine (VISTARIL) 50 MG capsule [id number] Yes TAKE 1 TO 2 CAPSULES (50 TO 100 MG TOTAL) BY MOUTH 3 (THREE) TIMES DAILY AS NEEDED FOR ITCHING. Dorena Dew, FNP Taking Active   linaclotide Rolan Lipa) 72 MCG capsule [id number] Yes Take 1 capsule (72 mcg total) by mouth daily before breakfast. Fenton Foy, NP Taking Active   methocarbamol (ROBAXIN) 500 MG tablet [id number] Yes TAKE 1 TABLET (500 MG TOTAL) BY MOUTH 3 (THREE) TIMES DAILY. Dorena Dew, FNP Taking Active   Multiple Vitamin (MULTIVITAMIN) capsule [id number] Yes Take 4 capsules by mouth 2 (two) times daily. Centrum plus [provider] Taking Active Self  NIFEdipine (PROCARDIA-XL/NIFEDICAL-XL) 30 MG 24 hr tablet [id number] Yes TAKE 1 TABLET (30 MG TOTAL) BY MOUTH 2 (TWO) TIMES DAILY. Dorena Dew, FNP Taking Active   omeprazole (PRILOSEC) 20 MG capsule [id number] Yes TAKE 1 CAPSULE (20 MG TOTAL) BY MOUTH 2 (TWO) TIMES DAILY BEFORE A MEAL. Fenton Foy, NP Taking Active   Tavaborole 5 % SOLN [id number] No Apply 1 application topically daily.  Patient not taking: Reported on 12/01/2021   Trula Slade, DPM Not Taking Active   triamcinolone cream (KENALOG) 0.1 % [id number] Yes APPLY 1 APPLICATION TOPICALLY 2 (TWO) TIMES DAILY. Fenton Foy,  NP Taking Active               Assessment/Plan:   Hypertension: - Currently uncontrolled and with possible side effects from therapy - Reviewed long term cardiovascular and renal outcomes of uncontrolled blood pressure - Reviewed appropriate blood pressure monitoring technique and reviewed goal blood pressure. Recommended to check home blood pressure and heart rate daily - Discussed with PCP, she is in agreement. Reduce nifedipine to 30 mg daily. Stop amlodipine as patient is not taking. Add hydrochlorothiazide 12.5 mg daily in the morning. Patient verbalizes understanding   Hyperlipidemia/ASCVD Risk Reduction: - Currently uncontrolled.  - Recommend to continue current regimen at this time, will discuss adherence and updated lipid panel moving forward    Follow Up Plan: phone call in 3 weeks  Catie Hedwig Morton, PharmD, [place] [phone]

## 2022-01-04 ENCOUNTER — Telehealth: Payer: Self-pay | Admitting: Pharmacist

## 2022-01-04 ENCOUNTER — Other Ambulatory Visit: Payer: Medicaid Other | Admitting: Pharmacist

## 2022-01-04 NOTE — Progress Notes (Signed)
Attempted to contact patient for scheduled appointment for medication management. Left HIPAA compliant message for patient to return my call at their convenience.   Catie T. Aracelys Glade, PharmD, BCACP [facility] [phone]  

## 2022-01-10 ENCOUNTER — Ambulatory Visit: Payer: Medicaid Other | Admitting: Nurse Practitioner

## 2022-01-11 ENCOUNTER — Telehealth: Payer: Self-pay

## 2022-01-11 NOTE — Chronic Care Management (AMB) (Signed)
   Care Guide Note  01/11/2022 Name: Brittany [name] MRN: [id number] DOB: 09-05-1965  Referred by: Vevelyn Francois, NP Reason for referral : Care Coordination (Outreach to reschedule missed f/u with Pharm D )   Brittany [name] is a 56 y.o. year old female who is a primary care patient of Vevelyn Francois, NP. Brittany [name] was referred to the pharmacist for assistance related to DM.    An unsuccessful telephone outreach was attempted today to contact the patient who was referred to the pharmacy team for assistance with medication management. Additional attempts will be made to contact the patient.   Brittany [name], [name], Brittany [name] [id number] Direct Dial: [phone] [email or web address]

## 2022-01-17 NOTE — Chronic Care Management (AMB) (Signed)
   Care Guide Note  01/17/2022 Name: Brittany [name] MRN: [id number] DOB: 06-30-1965  Referred by: Vevelyn Francois, NP Reason for referral : Care Coordination (Outreach to reschedule missed f/u with Pharm D )   Brittany [name] is a 56 y.o. year old female who is a primary care patient of Vevelyn Francois, NP. Brittany [name] was referred to the pharmacist for assistance related to DM.    A second unsuccessful telephone outreach was attempted today to contact the patient who was referred to the pharmacy team for assistance with medication management. Additional attempts will be made to contact the patient.  Brittany [name], [place], [place] [address] Direct Dial: [phone] [name]

## 2022-01-23 NOTE — Progress Notes (Signed)
   Care Guide Note  01/23/2022 Name: Brittany [name] MRN: [id number] DOB: 07/19/1965  Referred by: Vevelyn Francois, NP Reason for referral : Care Coordination (Outreach to reschedule missed f/u with Pharm D )   Brittany [name] is a 56 y.o. year old female who is a primary care patient of Vevelyn Francois, NP. Brittany [name] was referred to the pharmacist for assistance related to DM.    A third unsuccessful telephone outreach was attempted today to contact the patient who was referred to the pharmacy team for assistance with medication management. The Population Health team is pleased to engage with this patient at any time in the future upon receipt of referral and should he/she be interested in assistance from the [facility] team.   Noreene Larsson, [place], [place] [address] Direct Dial: [phone] [email or web address]

## 2022-01-23 NOTE — Telephone Encounter (Signed)
3rd unsuccessful outreach  

## 2022-02-23 ENCOUNTER — Telehealth: Payer: Self-pay | Admitting: Nurse Practitioner

## 2022-02-23 NOTE — Telephone Encounter (Signed)
Caller & Relationship to patient:  MRN #  [id number]   Call Back Number:   Date of Last Office Visit: 12/09/2021     Date of Next Office Visit: 06/01/2022    Medication(s) to be Refilled:  Linzess Prilosec Procardia Robaxin Vistaril Hctz Lasix Ventolin  Preferred Pharmacy: [facility]  ** Please notify patient to allow 48-72 hours to process** **Let patient know to contact pharmacy at the end of the day to make sure medication is ready. ** **If patient has not been seen in a year or longer, book an appointment **Advise to use MyChart for refill requests OR to contact their pharmacy

## 2022-02-24 ENCOUNTER — Other Ambulatory Visit: Payer: Self-pay | Admitting: Nurse Practitioner

## 2022-02-24 DIAGNOSIS — R0602 Shortness of breath: Secondary | ICD-10-CM

## 2022-02-24 DIAGNOSIS — R609 Edema, unspecified: Secondary | ICD-10-CM

## 2022-02-24 DIAGNOSIS — K589 Irritable bowel syndrome without diarrhea: Secondary | ICD-10-CM

## 2022-02-24 DIAGNOSIS — I10 Essential (primary) hypertension: Secondary | ICD-10-CM

## 2022-02-24 DIAGNOSIS — R600 Localized edema: Secondary | ICD-10-CM

## 2022-02-24 MED ORDER — HYDROCHLOROTHIAZIDE 12.5 MG PO CAPS: 12.5000 mg | ORAL_CAPSULE | Freq: Every day | ORAL | 1 refills | Status: DC

## 2022-02-24 MED ORDER — FUROSEMIDE 20 MG PO TABS: 20.0000 mg | ORAL_TABLET | Freq: Two times a day (BID) | ORAL | 0 refills | Status: DC | PRN

## 2022-02-24 MED ORDER — METHOCARBAMOL 500 MG PO TABS: 500.0000 mg | ORAL_TABLET | Freq: Three times a day (TID) | ORAL | 0 refills | Status: DC

## 2022-02-24 MED ORDER — NIFEDIPINE ER OSMOTIC RELEASE 30 MG PO TB24: 30.0000 mg | ORAL_TABLET | Freq: Every day | ORAL | 1 refills | Status: DC

## 2022-02-24 MED ORDER — HYDROXYZINE PAMOATE 50 MG PO CAPS: ORAL_CAPSULE | ORAL | 0 refills | Status: DC

## 2022-02-24 MED ORDER — ALBUTEROL SULFATE HFA 108 (90 BASE) MCG/ACT IN AERS: 2.0000 | INHALATION_SPRAY | RESPIRATORY_TRACT | 11 refills | Status: DC | PRN

## 2022-02-24 MED ORDER — OMEPRAZOLE 20 MG PO CPDR: 20.0000 mg | DELAYED_RELEASE_CAPSULE | Freq: Two times a day (BID) | ORAL | 3 refills | Status: DC

## 2022-02-24 MED ORDER — LINACLOTIDE 72 MCG PO CAPS: 72.0000 ug | ORAL_CAPSULE | Freq: Every day | ORAL | 0 refills | Status: DC

## 2022-03-07 ENCOUNTER — Other Ambulatory Visit: Payer: Self-pay

## 2022-06-01 ENCOUNTER — Ambulatory Visit: Payer: Self-pay | Admitting: Nurse Practitioner

## 2022-06-12 ENCOUNTER — Emergency Department (HOSPITAL_BASED_OUTPATIENT_CLINIC_OR_DEPARTMENT_OTHER)
Admission: EM | Admit: 2022-06-12 | Discharge: 2022-06-12 | Disposition: A | Discharge status: Home / Self Care | Payer: Medicaid Other | Attending: Emergency Medicine | Admitting: Emergency Medicine

## 2022-06-12 ENCOUNTER — Encounter (HOSPITAL_BASED_OUTPATIENT_CLINIC_OR_DEPARTMENT_OTHER): Payer: Self-pay | Admitting: Urology

## 2022-06-12 DIAGNOSIS — I10 Essential (primary) hypertension: Secondary | ICD-10-CM | POA: Diagnosis not present

## 2022-06-12 DIAGNOSIS — Z79899 Other long term (current) drug therapy: Secondary | ICD-10-CM | POA: Insufficient documentation

## 2022-06-12 DIAGNOSIS — Z91040 Latex allergy status: Secondary | ICD-10-CM | POA: Insufficient documentation

## 2022-06-12 DIAGNOSIS — Z7982 Long term (current) use of aspirin: Secondary | ICD-10-CM | POA: Insufficient documentation

## 2022-06-12 DIAGNOSIS — M5441 Lumbago with sciatica, right side: Secondary | ICD-10-CM | POA: Diagnosis not present

## 2022-06-12 DIAGNOSIS — M545 Low back pain, unspecified: Secondary | ICD-10-CM | POA: Diagnosis present

## 2022-06-12 DIAGNOSIS — G8929 Other chronic pain: Secondary | ICD-10-CM | POA: Insufficient documentation

## 2022-06-12 MED ORDER — PREDNISONE 10 MG (21) PO TBPK: ORAL_TABLET | Freq: Every day | ORAL | 0 refills | Status: DC

## 2022-06-12 MED ORDER — CYCLOBENZAPRINE HCL 10 MG PO TABS: 10.0000 mg | ORAL_TABLET | Freq: Two times a day (BID) | ORAL | 0 refills | Status: DC | PRN

## 2022-06-12 MED ORDER — FENTANYL CITRATE PF 50 MCG/ML IJ SOSY
25.0000 ug | PREFILLED_SYRINGE | Freq: Once | INTRAMUSCULAR | Status: AC
  Administered 2022-06-12: 25 ug via INTRAMUSCULAR
  Filled 2022-06-12: qty 1

## 2022-06-12 MED ORDER — LIDOCAINE 5 % EX PTCH
1.0000 | MEDICATED_PATCH | CUTANEOUS | Status: DC
  Administered 2022-06-12: 1 via TRANSDERMAL
  Filled 2022-06-12: qty 1

## 2022-06-12 NOTE — ED Notes (Signed)
Reviewed discharge instructions, follow up and medications with pt. Pt states understanding. Able to ambulate comfortably with rollator at time of discharge. Accompanied by mother

## 2022-06-12 NOTE — Discharge Instructions (Addendum)
Please take your medication as prescribed.  You can take tylenol/ibuprofen and flexeril for pain. I recommend close follow-up with orthopedics for reevaluation.  Please do not hesitate to return to emergency department if worrisome signs symptoms we discussed become apparent.

## 2022-06-12 NOTE — ED Provider Notes (Signed)
[facility] Provider Note   CSN: [id number] Arrival date & time: 06/12/22  1143     History  Chief Complaint  Patient presents with   Back Pain    Brittany [name] is a 57 y.o. female with a past medical history of GERD, hypertension, chronic back pain presents today for evaluation of low back pain.  Patient states she has has chronic back pain that comes up every 3 months.  This time the pain has been going on for 3 weeks.  States the pain started from her lower back and radiates to her right leg consistent with her history of sciatica.  History of stroke with states the pain is worse with movement, positional changes.  States the pain got better if she lays on the left side.  States she has a history of neuropathy with tingling and numbness on her hands and feet.  She denies any urinary retention, bowel incontinence, fever.  Denies IVDU.  States she has tried Robaxin, Tylenol, gabapentin, stroid at home with no relief.  Patient is ambulatory in triage.   Back Pain   Past Medical History:  Diagnosis Date   Anemia    Arthritis    trigger finger in left hand   Complication of anesthesia    per pt, hard to wake up!   Elevated cholesterol    Female bladder prolapse    per urologist, does not have prolaspe   GERD (gastroesophageal reflux disease)    Heart murmur    pt unsure.   Hiatal hernia    Hypertension    IBS (irritable bowel syndrome)    Multiple myeloma ([facility]) 2005   had partial chemo   Peripheral neuropathy    SOB (shortness of breath) on exertion    uses an inhaler   Stroke [facility]) 2017   paralysis left arm/uses a walker   Past Surgical History:  Procedure Laterality Date   BONE BIOPSY  2005   in her back   COLONOSCOPY     over 10 years x3   ESOPHAGOGASTRODUODENOSCOPY     incomplete-over 10 years ago    TEE WITHOUT CARDIOVERSION N/A 12/08/2016   Procedure: TRANSESOPHAGEAL ECHOCARDIOGRAM (TEE);  Surgeon: Sueanne Margarita, MD;  Location: [facility] ENDOSCOPY;  Service: Cardiovascular;  Laterality: N/A;   TUBAL LIGATION     UPPER GASTROINTESTINAL ENDOSCOPY     WISDOM TOOTH EXTRACTION       Home Medications Prior to Admission medications   Medication Sig Start Date End Date Taking? Authorizing Provider  cyclobenzaprine (FLEXERIL) 10 MG tablet Take 1 tablet (10 mg total) by mouth 2 (two) times daily as needed for muscle spasms. 06/12/22  Yes Rex Kras, PA  predniSONE (STERAPRED UNI-PAK 21 TAB) 10 MG (21) TBPK tablet Take by mouth daily. Take 6 tabs by mouth daily  for 2 days, then 5 tabs for 2 days, then 4 tabs for 2 days, then 3 tabs for 2 days, 2 tabs for 2 days, then 1 tab by mouth daily for 2 days 06/12/22  Yes Rex Kras, PA  albuterol (VENTOLIN HFA) 108 (90 Base) MCG/ACT inhaler Inhale 2 puffs into the lungs every 4 (four) hours as needed for wheezing or shortness of breath. 02/24/22   Fenton Foy, NP  aspirin 325 MG tablet Take 1 tablet (325 mg total) by mouth daily. 12/09/16   Mikhail, Velta Addison, DO  atorvastatin (LIPITOR) 40 MG tablet Take 1 tablet (40 mg total) by mouth daily. 01/24/21  01/24/22  Vevelyn Francois, NP  ciclopirox (PENLAC) 8 % solution APPLY TOPICALLY AT BEDTIME. APPLY OVER NAIL AND SURROUNDING SKIN. APPLY DAILY OVER PREVIOUS COAT. AFTER SEVEN (7) DAYS, MAY REMOVE WITH ALCOHOL AND CONTINUE CYCLE. Patient not taking: Reported on 12/14/2021 05/13/21   Trula Slade, DPM  cyanocobalamin 1000 MCG tablet Take 1 tablet (1,000 mcg total) by mouth daily. 02/05/18   Azzie Glatter, FNP  docusate sodium (COLACE) 100 MG capsule Take 200 mg by mouth 2 (two) times daily.    [provider]  Ergocalciferol (VITAMIN D2) 10 MCG (400 UNIT) TABS Take 10 mcg by mouth daily. 02/06/18   Azzie Glatter, FNP  furosemide (LASIX) 20 MG tablet Take 1 tablet (20 mg total) by mouth 2 (two) times daily as needed. 02/24/22 02/24/23  Fenton Foy, NP  gabapentin (NEURONTIN) 300 MG capsule Take 1 capsule (300 mg total)  by mouth 3 (three) times daily. 10/06/20   Vevelyn Francois, NP  hydrochlorothiazide (MICROZIDE) 12.5 MG capsule Take 1 capsule (12.5 mg total) by mouth daily. 02/24/22   Fenton Foy, NP  hydrOXYzine (VISTARIL) 50 MG capsule TAKE 1 TO 2 CAPSULES (50 TO 100 MG TOTAL) BY MOUTH 3 (THREE) TIMES DAILY AS NEEDED FOR ITCHING. 02/24/22   Fenton Foy, NP  linaclotide [facility]) 72 MCG capsule Take 1 capsule (72 mcg total) by mouth daily before breakfast. 02/24/22   Fenton Foy, NP  methocarbamol (ROBAXIN) 500 MG tablet Take 1 tablet (500 mg total) by mouth 3 (three) times daily. 02/24/22   Fenton Foy, NP  Multiple Vitamin (MULTIVITAMIN) capsule Take 4 capsules by mouth 2 (two) times daily. Centrum plus    [provider]  NIFEdipine (PROCARDIA-XL/NIFEDICAL-XL) 30 MG 24 hr tablet Take 1 tablet (30 mg total) by mouth daily. 02/24/22   Fenton Foy, NP  omeprazole (PRILOSEC) 20 MG capsule Take 1 capsule (20 mg total) by mouth 2 (two) times daily before a meal. 02/24/22 02/24/23  Fenton Foy, NP  Tavaborole 5 % SOLN Apply 1 application topically daily. Patient not taking: Reported on 12/01/2021 05/13/21   Trula Slade, DPM  triamcinolone cream (KENALOG) 0.1 % APPLY 1 APPLICATION TOPICALLY 2 (TWO) TIMES DAILY. 11/09/21   Fenton Foy, NP      Allergies    Albuterol, Amitriptyline, Duloxetine, Naproxen, Other, Darvon [propoxyphene], Hydrocodone, Lactose intolerance (gi), Latex, Oxycodone, Percocet [oxycodone-acetaminophen], and Topamax [topiramate]    Review of Systems   Review of Systems  Musculoskeletal:  Positive for back pain.    Physical Exam Updated Vital Signs BP (!) 173/76   Pulse 72   Temp 97.9 F (36.6 C) (Oral)   Resp 16   Ht 5\' 1"  (1.549 m)   Wt 77.6 kg   SpO2 100%   BMI 32.32 kg/m  Physical Exam Vitals and nursing note reviewed.  Constitutional:      Appearance: Normal appearance.  HENT:     Head: Normocephalic and atraumatic.     Mouth/Throat:      Mouth: Mucous membranes are moist.  Eyes:     General: No scleral icterus. Cardiovascular:     Rate and Rhythm: Normal rate and regular rhythm.     Pulses: Normal pulses.     Heart sounds: Normal heart sounds.  Pulmonary:     Effort: Pulmonary effort is normal.     Breath sounds: Normal breath sounds.  Abdominal:     General: Abdomen is flat.  Palpations: Abdomen is soft.     Tenderness: There is no abdominal tenderness.  Musculoskeletal:        General: No deformity.     Comments: Tenderness to palpation to lower back.  No midline tenderness.  Skin:    General: Skin is warm.     Findings: No rash.  Neurological:     General: No focal deficit present.     Mental Status: She is alert.  Psychiatric:        Mood and Affect: Mood normal.     ED Results / Procedures / Treatments   Labs (all labs ordered are listed, but only abnormal results are displayed) Labs Reviewed - No data to display  EKG None  Radiology No results found.  Procedures Procedures    Medications Ordered in ED Medications  lidocaine (LIDODERM) 5 % 1 patch (1 patch Transdermal Patch Applied 06/12/22 1217)  fentaNYL (SUBLIMAZE) injection 25 mcg (25 mcg Intramuscular Given 06/12/22 1216)    ED Course/ Medical Decision Making/ A&P                             Medical Decision Making  This patient presents to the ED for lower back pain, this involves an extensive number of treatment options, and is a complaint that carries with a high risk of complications and morbidity.  The differential diagnosis includes musculoskeletal pain, sciatica, spinal stenosis, cauda equina, neuropathy, kidney stone, infected stone,etc.  This is not an exhaustive list.  Problem list/ ED course/ Critical interventions/ Medical management: HPI: See above Vital signs within normal range and stable throughout visit. Laboratory/imaging studies significant for: See above. On physical examination, patient is afebrile and  appears in no acute distress. This patient presents with back pain most consistent with sciatica. Differential diagnoses includes lumbago versus musculoskeletal spasm / strain versus sciatica. Likely sciatica as symptoms are similar to her sciatic pain in the past.. No back pain red flags on history or physical. Presentation not consistent with malignancy (lack of history of malignancy, lack of B symptoms), fracture (no trauma, no bony tenderness to palpation), cauda equina (no bowel or urinary incontinence/retention, no saddle anesthesia, no distal weakness), AAA, viscus perforation, osteomyelitis or epidural abscess (no IVDU, vertebral tenderness), renal colic, pyelonephritis (afebrile, no CVAT, no urinary symptoms). Given the clinical picture, no indication for imaging at this time. Fentanyl and lidocaine patch ordered.  Reevaluation of patient after these medications showed that patient improved.  States she has much less pain and was able to ambulate in the room with minimal difficulty.  I sent an Rx of Flexeril and a short course of prednisone.  Advised patient to take Tylenol/ibuprofen, steroid, muscle relaxant as prescribed, follow-up with orthopedics for further evaluation and management and return to the ER if new or worsening symptoms.  I have reviewed the patient home medicines and have made adjustments as needed.  Cardiac monitoring/EKG: The patient was maintained on a cardiac monitor.  I personally reviewed and interpreted the cardiac monitor which showed an underlying rhythm of: sinus rhythm.  Additional history obtained: External records from outside source obtained and reviewed including: Chart review including previous notes, labs, imaging.  Consultations obtained:  Disposition Continued outpatient therapy. Follow-up with PCP recommended for reevaluation of symptoms. Treatment plan discussed with patient.  Pt acknowledged understanding was agreeable to the plan. Worrisome signs and  symptoms were discussed with patient, and patient acknowledged understanding to return to the ED if  they noticed these signs and symptoms. Patient was stable upon discharge.   This chart was dictated using voice recognition software.  Despite best efforts to proofread,  errors can occur which can change the documentation meaning.          Final Clinical Impression(s) / ED Diagnoses Final diagnoses:  Chronic right-sided low back pain with right-sided sciatica    Rx / DC Orders ED Discharge Orders          Ordered    cyclobenzaprine (FLEXERIL) 10 MG tablet  2 times daily PRN        06/12/22 1307    predniSONE (STERAPRED UNI-PAK 21 TAB) 10 MG (21) TBPK tablet  Daily        06/12/22 1307              Rex Kras, PA 06/12/22 1316    Long, [facility], MD 06/15/22 1558

## 2022-06-12 NOTE — ED Triage Notes (Signed)
Chronic right lower back pain x 1 year, worsening over the past 3 weeks H/o sciatica with pain radiating to right leg  Ambulatory to treatment room with walker  Takes gabapentin for neuropathy

## 2022-06-21 ENCOUNTER — Ambulatory Visit: Payer: Self-pay | Admitting: Nurse Practitioner

## 2022-07-05 ENCOUNTER — Encounter: Payer: Self-pay | Admitting: Family Medicine

## 2022-07-05 ENCOUNTER — Ambulatory Visit (INDEPENDENT_AMBULATORY_CARE_PROVIDER_SITE_OTHER): Payer: Medicaid Other | Admitting: Family Medicine

## 2022-07-05 ENCOUNTER — Ambulatory Visit (HOSPITAL_BASED_OUTPATIENT_CLINIC_OR_DEPARTMENT_OTHER)
Admission: RE | Admit: 2022-07-05 | Discharge: 2022-07-05 | Disposition: A | Discharge status: Home / Self Care | Payer: Medicaid Other | Source: Ambulatory Visit | Attending: Family Medicine | Admitting: Family Medicine

## 2022-07-05 VITALS — BP 192/66 | HR 74 | Ht 61.0 in | Wt 160.0 lb

## 2022-07-05 DIAGNOSIS — E538 Deficiency of other specified B group vitamins: Secondary | ICD-10-CM | POA: Diagnosis not present

## 2022-07-05 DIAGNOSIS — M5441 Lumbago with sciatica, right side: Secondary | ICD-10-CM | POA: Diagnosis not present

## 2022-07-05 DIAGNOSIS — E559 Vitamin D deficiency, unspecified: Secondary | ICD-10-CM

## 2022-07-05 DIAGNOSIS — R748 Abnormal levels of other serum enzymes: Secondary | ICD-10-CM

## 2022-07-05 DIAGNOSIS — R739 Hyperglycemia, unspecified: Secondary | ICD-10-CM | POA: Diagnosis not present

## 2022-07-05 DIAGNOSIS — G8929 Other chronic pain: Secondary | ICD-10-CM | POA: Insufficient documentation

## 2022-07-05 DIAGNOSIS — Z0000 Encounter for general adult medical examination without abnormal findings: Secondary | ICD-10-CM

## 2022-07-05 DIAGNOSIS — I10 Essential (primary) hypertension: Secondary | ICD-10-CM | POA: Diagnosis not present

## 2022-07-05 DIAGNOSIS — M5440 Lumbago with sciatica, unspecified side: Secondary | ICD-10-CM | POA: Diagnosis not present

## 2022-07-05 NOTE — Progress Notes (Signed)
New Patient Office Visit  Subjective    Patient ID: Quaniya Damas, female    DOB: 01/17/1966  Age: 57 y.o. MRN: [id number]  CC:  Chief Complaint  Patient presents with   Establish Care    HPI Jenayah Antu presents to establish care. Previous PCP was at the [facility], but she had trouble getting to [facility] for appointments and would like to establish here.   Hypertension: - Medications: nifedipine 30 mg daily, hctz 12.5 mg daily (just started last week, had to be mailed to her) - Compliance: good - Checking BP at home: 140/80s - Denies any SOB, recurrent headaches, CP, vision changes, LE edema, dizziness, palpitations, or medication side effects. - she also has PRN lasix for edema   IBS: - has not seen GI recently; well controlled usually - linzess daily works well   Hyperlipidemia: - medications: atorvastatin 40 mg daily - compliance: good - medication SEs: none The ASCVD Risk score (Arnett DK, et al., 2019) failed to calculate for the following reasons:   The patient has a prior MI or stroke diagnosis   Hx of CVA: - 2017, left arm weakness now; sometimes uses walker   B12 deficiency - daily supplement 1000 mcg daily   Dyspnea on exertion: - uses albuterol as needed    Vitamin D deficiency - takes 400 units daily   Bilateral upper and lower extremity neuropathy: - gabapentin 300 mg TID works well - she also takes PRN robaxin for generalized muscle spasms    GERD: - omeprazole 20 mg BID and diet modifications    Right lower back pain: - Patient reports she has had chronic right lower back pain for the past year or two, but flares have been gradually worsening. Most recent flare started about 4-6 weeks ago. Pain is achy-pressure to right lower back with radiation and sciatica into right leg. At rest, lying on left side, pain is 4/10 at best; with any movement or position changes, pain increases to 8/10. She has been taking  tylenol, gabapentin, and robaxin with minimal improvement. She is not sure if she's had imaging done. She was in the ED on 06/12/22 and discharged with prednisone and muscle relaxers. She denies saddle paresthesia and incontinence.      Outpatient Encounter Medications as of 07/05/2022  Medication Sig   albuterol (VENTOLIN HFA) 108 (90 Base) MCG/ACT inhaler Inhale 2 puffs into the lungs every 4 (four) hours as needed for wheezing or shortness of breath.   aspirin 325 MG tablet Take 1 tablet (325 mg total) by mouth daily.   atorvastatin (LIPITOR) 40 MG tablet Take 1 tablet (40 mg total) by mouth daily.   ciclopirox (PENLAC) 8 % solution APPLY TOPICALLY AT BEDTIME. APPLY OVER NAIL AND SURROUNDING SKIN. APPLY DAILY OVER PREVIOUS COAT. AFTER SEVEN (7) DAYS, MAY REMOVE WITH ALCOHOL AND CONTINUE CYCLE. (Patient not taking: Reported on 12/14/2021)   cyanocobalamin 1000 MCG tablet Take 1 tablet (1,000 mcg total) by mouth daily.   cyclobenzaprine (FLEXERIL) 10 MG tablet Take 1 tablet (10 mg total) by mouth 2 (two) times daily as needed for muscle spasms.   docusate sodium (COLACE) 100 MG capsule Take 200 mg by mouth 2 (two) times daily.   Ergocalciferol (VITAMIN D2) 10 MCG (400 UNIT) TABS Take 10 mcg by mouth daily.   furosemide (LASIX) 20 MG tablet Take 1 tablet (20 mg total) by mouth 2 (two) times daily as needed.   gabapentin (NEURONTIN) 300 MG capsule Take  1 capsule (300 mg total) by mouth 3 (three) times daily.   hydrochlorothiazide (MICROZIDE) 12.5 MG capsule Take 1 capsule (12.5 mg total) by mouth daily.   hydrOXYzine (VISTARIL) 50 MG capsule TAKE 1 TO 2 CAPSULES (50 TO 100 MG TOTAL) BY MOUTH 3 (THREE) TIMES DAILY AS NEEDED FOR ITCHING.   linaclotide (LINZESS) 72 MCG capsule Take 1 capsule (72 mcg total) by mouth daily before breakfast.   methocarbamol (ROBAXIN) 500 MG tablet Take 1 tablet (500 mg total) by mouth 3 (three) times daily.   Multiple Vitamin (MULTIVITAMIN) capsule Take 4 capsules by  mouth 2 (two) times daily. Centrum plus   NIFEdipine (PROCARDIA-XL/NIFEDICAL-XL) 30 MG 24 hr tablet Take 1 tablet (30 mg total) by mouth daily.   omeprazole (PRILOSEC) 20 MG capsule Take 1 capsule (20 mg total) by mouth 2 (two) times daily before a meal.   Tavaborole 5 % SOLN Apply 1 application topically daily. (Patient not taking: Reported on 12/01/2021)   triamcinolone cream (KENALOG) 0.1 % APPLY 1 APPLICATION TOPICALLY 2 (TWO) TIMES DAILY.   [DISCONTINUED] predniSONE (STERAPRED UNI-PAK 21 TAB) 10 MG (21) TBPK tablet Take by mouth daily. Take 6 tabs by mouth daily  for 2 days, then 5 tabs for 2 days, then 4 tabs for 2 days, then 3 tabs for 2 days, 2 tabs for 2 days, then 1 tab by mouth daily for 2 days   No facility-administered encounter medications on file as of 07/05/2022.    Past Medical History:  Diagnosis Date   Anemia    Arthritis    trigger finger in left hand   Complication of anesthesia    per pt, hard to wake up!   Elevated cholesterol    Female bladder prolapse    per urologist, does not have prolaspe   GERD (gastroesophageal reflux disease)    Heart murmur    pt unsure.   Hiatal hernia    Hypertension    IBS (irritable bowel syndrome)    Multiple myeloma 2005   had partial chemo   Peripheral neuropathy    SOB (shortness of breath) on exertion    uses an inhaler   Stroke 2017   paralysis left arm/uses a walker    Past Surgical History:  Procedure Laterality Date   BONE BIOPSY  2005   in her back   COLONOSCOPY     over 10 years x3   ESOPHAGOGASTRODUODENOSCOPY     incomplete-over 10 years ago    TEE WITHOUT CARDIOVERSION N/A 12/08/2016   Procedure: TRANSESOPHAGEAL ECHOCARDIOGRAM (TEE);  Surgeon: Quintella Reichert, MD;  Location: [facility] ENDOSCOPY;  Service: Cardiovascular;  Laterality: N/A;   TUBAL LIGATION     UPPER GASTROINTESTINAL ENDOSCOPY     WISDOM TOOTH EXTRACTION      Family History  Problem Relation Age of Onset   Hypertension Mother    Sarcoidosis  Mother        currently in remission    Diverticulitis Mother    Irritable bowel syndrome Mother    Liver cancer Mother    Hypertension Father    Stomach cancer Father    Congestive Heart Failure Father    Stroke Maternal Uncle    Scoliosis Brother    Colon cancer Neg Hx    Esophageal cancer Neg Hx    Colon polyps Neg Hx    Rectal cancer Neg Hx     Social History   Socioeconomic History   Marital status: Legally Separated    Spouse name:  Not on file   Number of children: Not on file   Years of education: Not on file   Highest education level: Not on file  Occupational History   Occupation: Disabled  Tobacco Use   Smoking status: Former    Types: Cigarettes    Quit date: 12/06/2015    Years since quitting: 6.5   Smokeless tobacco: Never  Vaping Use   Vaping Use: Never used  Substance and Sexual Activity   Alcohol use: Not Currently   Drug use: Not Currently    Types: Marijuana    Comment: in the past    Sexual activity: Not Currently  Other Topics Concern   Not on file  Social History Narrative   Not on file   Social Determinants of Health   Financial Resource Strain: Not on file  Food Insecurity: Food Insecurity Present (12/08/2020)   Hunger Vital Sign    Worried About Running Out of Food in the Last Year: Sometimes true    Ran Out of Food in the Last Year: Sometimes true  Transportation Needs: No Transportation Needs (12/08/2020)   PRAPARE - Administrator, Civil Service (Medical): No    Lack of Transportation (Non-Medical): No  Physical Activity: Not on file  Stress: Not on file  Social Connections: Not on file  Intimate Partner Violence: Not on file    ROS All review of systems negative except what is listed in the HPI      Objective    BP (!) 192/66   Pulse 74   Ht 5\' 1"  (1.549 m)   Wt 160 lb (72.6 kg)   SpO2 100%   BMI 30.23 kg/m   Physical Exam Vitals reviewed.  Constitutional:      Appearance: Normal appearance.   Cardiovascular:     Rate and Rhythm: Normal rate and regular rhythm.     Pulses: Normal pulses.     Heart sounds: Normal heart sounds.  Pulmonary:     Effort: Pulmonary effort is normal.     Breath sounds: Normal breath sounds.  Musculoskeletal:     Cervical back: Normal range of motion and neck supple.     Comments: Decreased ROM due to back pain, using walker, right lower back paraspinal muscles tender to palpation  Skin:    General: Skin is warm and dry.  Neurological:     Mental Status: She is alert and oriented to person, place, and time.  Psychiatric:        Mood and Affect: Mood normal.        Behavior: Behavior normal.        Thought Content: Thought content normal.        Judgment: Judgment normal.         Assessment & Plan:   Problem List Items Addressed This Visit     Hypertension    Blood pressure is not at goal for age and co-morbidities.   Recommendations: continue nifedipine; increase hydrochlorothiazide to 25 mg daily (2 of your current tablets); follow-up in 2 weeks - BP goal <130/80 - monitor and log blood pressures at home - check around the same time each day in a relaxed setting - Limit salt to <2000 mg/day - Follow DASH eating plan (heart healthy diet) - limit alcohol to 2 standard drinks per day for men and 1 per day for women - avoid tobacco products - get at least 2 hours of regular aerobic exercise weekly Patient aware of signs/symptoms requiring further/urgent  evaluation. Labs updated today.      Relevant Orders   CBC with Differential/Platelet   Comprehensive metabolic panel   Hemoglobin A1c   Lipid panel   TSH   B12 deficiency    Continue supplementation. Repeat labs today.       Relevant Orders   Vitamin B12   Other Visit Diagnoses     Encounter for medical examination to establish care    -  Primary    Vitamin D deficiency     Repeat labs today    Relevant Orders   VITAMIN D 25 Hydroxy (Vit-D Deficiency, Fractures)    Chronic right-sided low back pain with right-sided sciatica     Referral to ortho and sports medicine.  Xray given duration Discussed supportive measures - rest, ice, heat, massage, home exercises/stretches   Relevant Orders   Ambulatory referral to Orthopedic Surgery   Ambulatory referral to Physical Therapy   DG Lumbar Spine 2-3 Views   Hyperglycemia     Lab Results  Component Value Date   HGBA1C 5.9 (A) 01/06/2021   HGBA1C 5.9 01/06/2021   HGBA1C 5.9 01/06/2021   HGBA1C 5.9 01/06/2021  Updating labs today Discussed healthy lifestyle choices    Relevant Orders   Comprehensive metabolic panel   Hemoglobin A1c       Return in about 2 weeks (around 07/19/2022) for blood pressure follow-up.   Clayborne Dana, NP

## 2022-07-05 NOTE — Assessment & Plan Note (Signed)
Continue supplementation. Repeat labs today.  ?

## 2022-07-05 NOTE — Patient Instructions (Addendum)
Blood pressure is not at goal for age and co-morbidities.   Recommendations: continue nifedipine; increase hydrochlorothiazide to 25 mg daily (2 of your current tablets); follow-up in 2 weeks - BP goal <130/80 - monitor and log blood pressures at home - check around the same time each day in a relaxed setting - Limit salt to <2000 mg/day - Follow DASH eating plan (heart healthy diet) - limit alcohol to 2 standard drinks per day for men and 1 per day for women - avoid tobacco products - get at least 2 hours of regular aerobic exercise weekly Patient aware of signs/symptoms requiring further/urgent evaluation. Labs updated today.   ---------------------------  Thank you for choosing [facility] at [facility] for your Primary Care needs. I am excited for the opportunity to partner with you to meet your health care goals. It was a pleasure meeting you today!  Information on diet, exercise, and health maintenance recommendations are listed below. This is information to help you be sure you are on track for optimal health and monitoring.   Please look over this and let us know if you have any questions or if you have completed any of the health maintenance outside of [facility] so that we can be sure your records are up to date.  ___________________________________________________________  MyChart:  For all urgent or time sensitive needs we ask that you please call the office to avoid delays. Our number is [phone]. MyChart is not constantly monitored and due to the large volume of messages a day, replies may take up to 72 business hours.  MyChart Policy: MyChart allows for you to see your visit notes, after visit summary, provider recommendations, lab and tests results, make an appointment, request refills, and contact your provider or the office for non-urgent questions or concerns. Providers are seeing patients during normal business hours and do not have built in  time to review MyChart messages.  We ask that you allow a minimum of 3 business days for responses to [employer]. For this reason, please do not send urgent requests through MyChart. Please call the office at [phone]. New and ongoing conditions may require a visit. We have virtual and in-person visits available for your convenience.  Complex MyChart concerns may require a visit. Your provider may request you schedule a virtual or in-person visit to ensure we are providing the best care possible. MyChart messages sent after 11:00 AM on Friday will not be received by the provider until Monday morning.    Lab and Test Results: You will receive your lab and test results on MyChart as soon as they are completed and results have been sent by the lab or testing facility. Due to this service, you will receive your results BEFORE your provider.  I review lab and test results each morning prior to seeing patients. Some results require collaboration with other providers to ensure you are receiving the most appropriate care. For this reason, we ask that you please allow a minimum of 3-5 business days from the time that ALL results have been received for your provider to receive and review lab and test results and contact you about these.  Most lab and test result comments from the provider will be sent through MyChart. Your provider may recommend changes to the plan of care, follow-up visits, repeat testing, ask questions, or request an office visit to discuss these results. You may reply directly to this message or call the office to provide information for  the provider or set up an appointment. In some instances, you will be called with test results and recommendations. Please let us know if this is preferred and we will make note of this in your chart to provide this for you.    If you have not heard a response to your lab or test results in 5 business days from all results returning to MyChart, please  call the office to let us know. We ask that you please avoid calling prior to this time unless there is an emergent concern. Due to high call volumes, this can delay the resulting process.  After Hours: For all non-emergency after hours needs, please call the office at [phone] and select the option to reach the on-call  service. On-call services are shared between multiple [facility] offices and therefore it will not be possible to speak directly with your provider. On-call providers may provide medical advice and recommendations, but are unable to provide refills for maintenance medications.  For all emergency or urgent medical needs after normal business hours, we recommend that you seek care at the closest Urgent Care or Emergency Department to ensure appropriate treatment in a timely manner.  [facility] has a 24 hour emergency room located on the ground floor for your convenience.   Urgent Concerns During the Business Day Providers are seeing patients from 8AM to 5PM with a busy schedule and are most often not able to respond to non-urgent calls until the end of the day or the next business day. If you should have URGENT concerns during the day, please call and speak to the nurse or schedule a same day appointment so that we can address your concern without delay.   Thank you, again, for choosing me as your health care partner. I appreciate your trust and look forward to learning more about you!   Lollie Marrow Reola Calkins, DNP, FNP-C  ___________________________________________________________  Health Maintenance Recommendations Screening Testing Mammogram Every 1-2 years based on history and risk factors Starting at age 66 Pap Smear Ages 21-39 every 3 years Ages 43-65 every 5 years with HPV testing More frequent testing may be required based on results and history Colon Cancer Screening Every 1-10 years based on test performed, risk factors, and history Starting at age 9 Bone  Density Screening Every 2-10 years based on history Starting at age 4 for women Recommendations for men differ based on medication usage, history, and risk factors AAA Screening One time ultrasound Men 69-93 years old who have ever smoked Lung Cancer Screening Low Dose Lung CT every 12 months Age 68-80 years with a 20 pack-year smoking history who still smoke or who have quit within the last 15 years  Screening Labs Routine  Labs: Complete Blood Count (CBC), Complete Metabolic Panel (CMP), Cholesterol (Lipid Panel) Every 6-12 months based on history and medications May be recommended more frequently based on current conditions or previous results Hemoglobin A1c Lab Every 3-12 months based on history and previous results Starting at age 84 or earlier with diagnosis of diabetes, high cholesterol, BMI >26, and/or risk factors Frequent monitoring for patients with diabetes to ensure blood sugar control Thyroid Panel  Every 6 months based on history, symptoms, and risk factors May be repeated more often if on medication HIV One time testing for all patients 50 and older May be repeated more frequently for patients with increased risk factors or exposure Hepatitis C One time testing for all patients 2 and older May be repeated more  frequently for patients with increased risk factors or exposure Gonorrhea, Chlamydia Every 12 months for all sexually active persons 13-24 years Additional monitoring may be recommended for those who are considered high risk or who have symptoms PSA Men 67-71 years old with risk factors Additional screening may be recommended from age 42-69 based on risk factors, symptoms, and history  Vaccine Recommendations Tetanus Booster All adults every 10 years Flu Vaccine All patients 6 months and older every year COVID Vaccine All patients 12 years and older Initial dosing with booster May recommend additional booster based on age and health history HPV  Vaccine 2 doses all patients age 69-26 Dosing may be considered for patients over 26 Shingles Vaccine (Shingrix) 2 doses all adults 50 years and older Pneumonia (Pneumovax 59) All adults 65 years and older May recommend earlier dosing based on health history Pneumonia (Prevnar 47) All adults 65 years and older Dosed 1 year after Pneumovax 23 Pneumonia (Prevnar 20) All adults 65 years and older (adults 19-64 with certain conditions or risk factors) 1 dose  For those who have not received Prevnar 13 vaccine previously   Additional Screening, Testing, and Vaccinations may be recommended on an individualized basis based on family history, health history, risk factors, and/or exposure.  __________________________________________________________  Diet Recommendations for All Patients  I recommend that all patients maintain a diet low in saturated fats, carbohydrates, and cholesterol. While this can be challenging at first, it is not impossible and small changes can make big differences.  Things to try: Decreasing the amount of soda, sweet tea, and/or juice to one or less per day and replace with water While water is always the first choice, if you do not like water you may consider adding a water additive without sugar to improve the taste other sugar free drinks Replace potatoes with a brightly colored vegetable  Use healthy oils, such as canola oil or olive oil, instead of butter or hard margarine Limit your bread intake to two pieces or less a day Replace regular pasta with low carb pasta options Bake, broil, or grill foods instead of frying Monitor portion sizes  Eat smaller, more frequent meals throughout the day instead of large meals  An important thing to remember is, if you love foods that are not great for your health, you don't have to give them up completely. Instead, allow these foods to be a reward when you have done well. Allowing yourself to still have special treats  every once in a while is a nice way to tell yourself thank you for working hard to keep yourself healthy.   Also remember that every day is a new day. If you have a bad day and "fall off the wagon", you can still climb right back up and keep moving along on your journey!  We have resources available to help you!  Some websites that may be helpful include: www.http://www.wall-moore.info/  Www.VeryWellFit.com _____________________________________________________________  Activity Recommendations for All Patients  I recommend that all adults get at least 20 minutes of moderate physical activity that elevates your heart rate at least 5 days out of the week.  Some examples include: Walking or jogging at a pace that allows you to carry on a conversation Cycling (stationary bike or outdoors) Water aerobics Yoga Weight lifting Dancing If physical limitations prevent you from putting stress on your joints, exercise in a pool or seated in a chair are excellent options.  Do determine your MAXIMUM heart rate for activity: 220 - YOUR  AGE = MAX Heart Rate   Remember! Do not push yourself too hard.  Start slowly and build up your pace, speed, weight, time in exercise, etc.  Allow your body to rest between exercise and get good sleep. You will need more water than normal when you are exerting yourself. Do not wait until you are thirsty to drink. Drink with a purpose of getting in at least 8, 8 ounce glasses of water a day plus more depending on how much you exercise and sweat.    If you begin to develop dizziness, chest pain, abdominal pain, jaw pain, shortness of breath, headache, vision changes, lightheadedness, or other concerning symptoms, stop the activity and allow your body to rest. If your symptoms are severe, seek emergency evaluation immediately. If your symptoms are concerning, but not severe, please let us know so that we can recommend further evaluation.

## 2022-07-05 NOTE — Assessment & Plan Note (Signed)
Blood pressure is not at goal for age and co-morbidities.   Recommendations: continue nifedipine; increase hydrochlorothiazide to 25 mg daily (2 of your current tablets); follow-up in 2 weeks - BP goal <130/80 - monitor and log blood pressures at home - check around the same time each day in a relaxed setting - Limit salt to <2000 mg/day - Follow DASH eating plan (heart healthy diet) - limit alcohol to 2 standard drinks per day for men and 1 per day for women - avoid tobacco products - get at least 2 hours of regular aerobic exercise weekly Patient aware of signs/symptoms requiring further/urgent evaluation. Labs updated today.

## 2022-07-06 ENCOUNTER — Encounter: Payer: Self-pay | Admitting: *Deleted

## 2022-07-06 LAB — COMPREHENSIVE METABOLIC PANEL
ALT: 11 U/L (ref 0–35)
AST: 18 U/L (ref 0–37)
Albumin: 4.3 g/dL (ref 3.5–5.2)
Alkaline Phosphatase: 149 U/L — ABNORMAL HIGH (ref 39–117)
BUN: 11 mg/dL (ref 6–23)
CO2: 26 mEq/L (ref 19–32)
Calcium: 9.7 mg/dL (ref 8.4–10.5)
Chloride: 105 mEq/L (ref 96–112)
Creatinine, Ser: 0.93 mg/dL (ref 0.40–1.20)
GFR: 68.65 mL/min (ref >60.00)
Glucose, Bld: 75 mg/dL (ref 70–99)
Potassium: 4 mEq/L (ref 3.5–5.1)
Sodium: 141 mEq/L (ref 135–145)
Total Bilirubin: 0.7 mg/dL (ref 0.2–1.2)
Total Protein: 7 g/dL (ref 6.0–8.3)

## 2022-07-06 LAB — LIPID PANEL
Cholesterol: 218 mg/dL — ABNORMAL HIGH (ref 0–200)
HDL: 40 mg/dL (ref >39.00)
LDL Cholesterol: 155 mg/dL — ABNORMAL HIGH (ref 0–99)
NonHDL: 177.92
Total CHOL/HDL Ratio: 5
Triglycerides: 114 mg/dL (ref 0.0–149.0)
VLDL: 22.8 mg/dL (ref 0.0–40.0)

## 2022-07-06 LAB — CBC WITH DIFFERENTIAL/PLATELET
Basophils Absolute: 0 10*3/uL (ref 0.0–0.1)
Basophils Relative: 0.4 % (ref 0.0–3.0)
Eosinophils Absolute: 0 10*3/uL (ref 0.0–0.7)
Eosinophils Relative: 0.3 % (ref 0.0–5.0)
HCT: 45.8 % (ref 36.0–46.0)
Hemoglobin: 15.2 g/dL — ABNORMAL HIGH (ref 12.0–15.0)
Lymphocytes Relative: 42.4 % (ref 12.0–46.0)
Lymphs Abs: 2.3 10*3/uL (ref 0.7–4.0)
MCHC: 33.1 g/dL (ref 30.0–36.0)
MCV: 90.2 fl (ref 78.0–100.0)
Monocytes Absolute: 0.5 10*3/uL (ref 0.1–1.0)
Monocytes Relative: 8.7 % (ref 3.0–12.0)
Neutro Abs: 2.6 10*3/uL (ref 1.4–7.7)
Neutrophils Relative %: 48.2 % (ref 43.0–77.0)
Platelets: 155 10*3/uL (ref 150.0–400.0)
RBC: 5.07 Mil/uL (ref 3.87–5.11)
RDW: 15.9 % — ABNORMAL HIGH (ref 11.5–15.5)
WBC: 5.5 10*3/uL (ref 4.0–10.5)

## 2022-07-06 LAB — HEMOGLOBIN A1C: Hgb A1c MFr Bld: 6.2 % (ref 4.6–6.5)

## 2022-07-06 LAB — VITAMIN B12: Vitamin B-12: 142 pg/mL — ABNORMAL LOW (ref 211–911)

## 2022-07-06 LAB — TSH: TSH: 0.9 u[IU]/mL (ref 0.35–5.50)

## 2022-07-06 LAB — VITAMIN D 25 HYDROXY (VIT D DEFICIENCY, FRACTURES): VITD: 7.69 ng/mL — ABNORMAL LOW (ref 30.00–100.00)

## 2022-07-06 MED ORDER — CYANOCOBALAMIN 2000 MCG PO TABS: 2000.0000 ug | ORAL_TABLET | Freq: Every day | ORAL | 3 refills | Status: DC

## 2022-07-06 MED ORDER — ATORVASTATIN CALCIUM 80 MG PO TABS: 80.0000 mg | ORAL_TABLET | Freq: Every day | ORAL | 3 refills | Status: DC

## 2022-07-06 MED ORDER — VITAMIN D (ERGOCALCIFEROL) 1.25 MG (50000 UNIT) PO CAPS: 50000.0000 [IU] | ORAL_CAPSULE | ORAL | 0 refills | Status: DC

## 2022-07-06 NOTE — Addendum Note (Signed)
Addended by: Hyman Hopes B on: 07/06/2022 01:10 PM   Modules accepted: Orders

## 2022-07-06 NOTE — Progress Notes (Signed)
Blood counts are stable.  Alk phos is persistently elevated - I want to add some extra fasting labs in 1-2 weeks (schedule lab appt; in the meantime, minimize tylenol and alcohol) Cholesterol is high. Let's try increasing your atorvastatin to 80 mg daily. B12 is low - I will increase your B12 supplement to 2,000 mcg daily  Vitamin D is low - let's increase to high-dose weekly supplementation for 8 weeks then go back to your daily dose. Schedule 70-month follow-up.  A1c is still in the prediabetes range - continue focusing on low-carb diet and exercise Thyroid is normal

## 2022-07-07 MED ORDER — GABAPENTIN 300 MG PO CAPS: 300.0000 mg | ORAL_CAPSULE | Freq: Three times a day (TID) | ORAL | 3 refills | Status: DC

## 2022-07-07 NOTE — Addendum Note (Signed)
Addended by: Hyman Hopes B on: 07/07/2022 12:25 PM   Modules accepted: Orders

## 2022-07-10 ENCOUNTER — Encounter: Payer: Self-pay | Admitting: Family Medicine

## 2022-07-10 DIAGNOSIS — I700 Atherosclerosis of aorta: Secondary | ICD-10-CM | POA: Insufficient documentation

## 2022-07-17 ENCOUNTER — Encounter: Payer: Self-pay | Admitting: Physical Medicine and Rehabilitation

## 2022-07-17 ENCOUNTER — Ambulatory Visit (INDEPENDENT_AMBULATORY_CARE_PROVIDER_SITE_OTHER): Payer: Medicaid Other | Admitting: Physical Medicine and Rehabilitation

## 2022-07-17 DIAGNOSIS — M5441 Lumbago with sciatica, right side: Secondary | ICD-10-CM | POA: Diagnosis not present

## 2022-07-17 DIAGNOSIS — M5416 Radiculopathy, lumbar region: Secondary | ICD-10-CM | POA: Diagnosis not present

## 2022-07-17 DIAGNOSIS — R269 Unspecified abnormalities of gait and mobility: Secondary | ICD-10-CM | POA: Diagnosis not present

## 2022-07-17 DIAGNOSIS — G8929 Other chronic pain: Secondary | ICD-10-CM

## 2022-07-17 NOTE — Progress Notes (Unsigned)
Functional Pain Scale - descriptive words and definitions  Distressing (6)    Pain is present/unable to complete most ADLs limited by pain/sleep is difficult and active distraction is only marginal. Moderate range order  Average Pain 8  Lower back pain on right side that radiates into the right leg to the ankle

## 2022-07-17 NOTE — Progress Notes (Unsigned)
Brittany [name] - 57 y.o. female MRN [id number]  Date of birth: September 01, 1965  Office Visit Note: Visit Date: 07/17/2022 PCP: Clayborne Dana, NP Referred by: Clayborne Dana, NP  Subjective: Chief Complaint  Patient presents with   Lower Back - Pain   HPI: Brittany [name] is a 57 y.o. female who comes in today per the request of Hyman Hopes, NP for evaluation of chronic, worsening and severe right sided back pain radiating to groin, buttock and down posterolateral leg to foot. She reports severe pain to entire right leg. Pain became severe about 2 months ago. Pain worsens with prolonged sitting, walking and bending. She describes pain as throbbing and sore sensation, currently rates as 8 out of 10. Some relief of pain with home exercise regimen, rest and use of medications. Continues with 300 mg Gabapentin TID. PCP recently placed referral for formal physical therapy, she is planning to start treatments soon. Patient was evaluated in the emergency room on 06/12/2022, was discharged home with regimen of oral Prednisone and Flexeril. Minimal relief of pain with these medications. Of note, patient does have multiple allergies and intolerances to medications. Recent lumbar x-ray imaging shows no acute abnormalities. Minimal degenerative changes to bilateral hips. No history of lumbar surgery/injections. Issues with gait post stroke, she uses roolator walker to assist with ambulation. Previously evaluated by Dr. Annell Greening in 2020  Patient denies focal weakness, numbness and tingling. No recent trauma or falls.   Patients course is complicated by stroke, hypertension and neuropathy.    Review of Systems  Musculoskeletal:  Positive for back pain and myalgias.  Neurological:  Negative for tingling, sensory change, focal weakness and weakness.  All other systems reviewed and are negative.  Otherwise per HPI.  Assessment & Plan: Visit Diagnoses:    ICD-10-CM   1. Lumbar radiculopathy  M54.16 MR LUMBAR  SPINE WO CONTRAST    2. Chronic right-sided low back pain with right-sided sciatica  M54.41 MR LUMBAR SPINE WO CONTRAST   G89.29     3. Gait abnormality  R26.9 MR LUMBAR SPINE WO CONTRAST       Plan: Findings:  Chronic, worsening and severe right sided back pain radiating to groin, buttock and down posterolateral leg to foot. Patient continues to have severe pain despite good conservative therapies such as home exercise regimen, rest and use of medications. Patients clinical presentation and exam are consistent with lumbar radiculopathy, L5 nerve pattern. Next step is to place order for lumbar MRI imaging. Depending on results of imaging we did discuss possibility of performing lumbar epidural steroid injection. If groin pain persists we could try diagnostic right hip injection. If this is more of intrinsic hip pathology would be beneficial to refer to orthopedic specialist, she has seen Dr. Ophelia Charter in the past. Patient did voice she is not interested in injections at this time. We will have her follow up after lumbar MRI is obtained to discuss results. Patient instructed to continue with current medication regimen and to proceed with physical therapy treatments. No red flag symptoms noted upon exam today.     Meds & Orders: No orders of the defined types were placed in this encounter.   Orders Placed This Encounter  Procedures   MR LUMBAR SPINE WO CONTRAST    Follow-up: Return for follow up for lumbar MRI review.   Procedures: No procedures performed      Clinical History: CLINICAL DATA:  Chronic low back pain.  Right-sided sciatica.   EXAM: LUMBAR  SPINE - 2-3 VIEW   COMPARISON:  CT abdomen and pelvis 05/30/2018   FINDINGS: Normal alignment in the lumbar spine. Normal appearance of bilateral SI joints. Vertebral body heights are maintained. No significant disc space narrowing. Mild degenerative endplate changes. Atherosclerotic calcifications in the abdominal aorta.    IMPRESSION: 1. No acute abnormality in lumbar spine. 2. Mild degenerative changes. 3. Aortic atherosclerosis.     Electronically Signed   By: Richarda Overlie M.D.   On: 07/09/2022 11:41   She reports that she quit smoking about 6 years ago. Her smoking use included cigarettes. She has never used smokeless tobacco.  Recent Labs    07/05/22 1520  HGBA1C 6.2    Objective:  VS:  HT:    WT:   BMI:     BP:   HR: bpm  TEMP: ( )  RESP:  Physical Exam Vitals and nursing note reviewed.  HENT:     Head: Normocephalic and atraumatic.     Right Ear: External ear normal.     Left Ear: External ear normal.     Nose: Nose normal.     Mouth/Throat:     Mouth: Mucous membranes are moist.  Eyes:     Extraocular Movements: Extraocular movements intact.  Cardiovascular:     Rate and Rhythm: Normal rate.     Pulses: Normal pulses.  Pulmonary:     Effort: Pulmonary effort is normal.  Abdominal:     General: Abdomen is flat. There is no distension.  Musculoskeletal:        General: Tenderness present.     Cervical back: Normal range of motion.     Comments: Patient is slow to rise from seated position to standing. Good lumbar range of motion. No pain noted with facet loading. 5/5 strength noted with bilateral hip flexion, knee flexion/extension, ankle dorsiflexion/plantarflexion and EHL. No clonus noted bilaterally. No pain upon palpation of greater trochanters. Pain with internal/external rotation of right hip. Sensation intact bilaterally. Dysesthesias noted to right L5 dermatome. Negative slump test bilaterally. Ambulates with rolling walker, broad based gait.  Skin:    General: Skin is warm and dry.     Capillary Refill: Capillary refill takes less than 2 seconds.  Neurological:     Mental Status: She is alert and oriented to person, place, and time.     Gait: Gait abnormal.  Psychiatric:        Mood and Affect: Mood normal.        Behavior: Behavior normal.     Ortho  Exam  Imaging: No results found.  Past Medical/Family/Surgical/Social History: Medications & Allergies reviewed per EMR, new medications updated. 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   Elevated cholesterol    Female bladder prolapse    per urologist, does not have prolaspe   GERD (gastroesophageal reflux disease)    Heart murmur    pt unsure.   Hiatal hernia    Hypertension    IBS (irritable bowel syndrome)    Multiple myeloma (HCC) 2005   had partial chemo   Peripheral neuropathy    SOB (shortness of breath) on exertion    uses an inhaler   Stroke (HCC) 2017   paralysis left arm/uses a walker   Family History  Problem Relation Age of Onset   Hypertension Mother    Sarcoidosis Mother        currently in remission    Diverticulitis Mother    Irritable bowel syndrome Mother    Liver cancer Mother    Hypertension Father    Stomach cancer Father    Congestive Heart Failure Father    Stroke Maternal Uncle    Scoliosis Brother    Colon cancer Neg Hx    Esophageal cancer Neg Hx    Colon polyps Neg Hx    Rectal cancer Neg Hx    Past Surgical History:  Procedure Laterality Date   BONE BIOPSY  2005   in her  back   COLONOSCOPY     over 10 years x3   ESOPHAGOGASTRODUODENOSCOPY     incomplete-over 10 years ago    TEE WITHOUT CARDIOVERSION N/A 12/08/2016   Procedure: TRANSESOPHAGEAL ECHOCARDIOGRAM (TEE);  Surgeon: Quintella Reichert, MD;  Location[facility] ENDOSCOPY;  Service: Cardiovascular;  Laterality: N/A;   TUBAL LIGATION     UPPER GASTROINTESTINAL ENDOSCOPY     WISDOM TOOTH EXTRACTION     Social History   Occupational History   Occupation: Disabled  Tobacco Use   Smoking status: Former    Types: Cigarettes    Quit date: 12/06/2015    Years since quitting: 6.6   Smokeless tobacco: Never  Vaping Use   Vaping Use: Never used  Substance and Sexual Activity   Alcohol use: Not Currently   Drug use: Not Currently    Types: Marijuana    Comment: in the past    Sexual activity: Not Currently

## 2022-07-19 ENCOUNTER — Other Ambulatory Visit: Payer: Medicaid Other

## 2022-07-19 ENCOUNTER — Ambulatory Visit: Payer: Medicaid Other | Admitting: Family Medicine

## 2022-07-26 ENCOUNTER — Other Ambulatory Visit: Payer: Self-pay | Admitting: Family Medicine

## 2022-07-26 DIAGNOSIS — K589 Irritable bowel syndrome without diarrhea: Secondary | ICD-10-CM

## 2022-07-26 MED ORDER — LINACLOTIDE 72 MCG PO CAPS: 72.0000 ug | ORAL_CAPSULE | Freq: Every day | ORAL | 1 refills | Status: DC

## 2022-07-26 NOTE — Telephone Encounter (Signed)
Prescription Request  07/26/2022  Is this a "Controlled Substance" medicine? No  LOV: 07/05/2022  What is the name of the medication or equipment?  linaclotide (LINZESS) 72 MCG capsule   Have you contacted your pharmacy to request a refill? No   Which pharmacy would you like this sent to?  [facility] - [place], [place] - [address] [place] [address] Phone: [phone] Fax: [phone]   Patient notified that their request is being sent to the clinical staff for review and that they should receive a response within 2 business days.   Please advise at [facility] [phone]

## 2022-07-26 NOTE — Telephone Encounter (Signed)
Patient just established care. Please advise.

## 2022-08-02 ENCOUNTER — Other Ambulatory Visit: Payer: Self-pay | Admitting: Family Medicine

## 2022-08-02 DIAGNOSIS — E559 Vitamin D deficiency, unspecified: Secondary | ICD-10-CM

## 2022-08-08 ENCOUNTER — Ambulatory Visit: Payer: Medicaid Other | Admitting: Physical Therapy

## 2022-08-09 ENCOUNTER — Ambulatory Visit
Admission: RE | Admit: 2022-08-09 | Discharge: 2022-08-09 | Disposition: A | Discharge status: Home / Self Care | Payer: Medicaid Other | Source: Ambulatory Visit | Attending: Physical Medicine and Rehabilitation | Admitting: Physical Medicine and Rehabilitation

## 2022-08-09 DIAGNOSIS — R531 Weakness: Secondary | ICD-10-CM | POA: Diagnosis not present

## 2022-08-09 DIAGNOSIS — M549 Dorsalgia, unspecified: Secondary | ICD-10-CM | POA: Diagnosis not present

## 2022-08-09 DIAGNOSIS — G8929 Other chronic pain: Secondary | ICD-10-CM

## 2022-08-09 DIAGNOSIS — M5431 Sciatica, right side: Secondary | ICD-10-CM | POA: Diagnosis not present

## 2022-08-09 DIAGNOSIS — R200 Anesthesia of skin: Secondary | ICD-10-CM | POA: Diagnosis not present

## 2022-08-09 DIAGNOSIS — M5416 Radiculopathy, lumbar region: Secondary | ICD-10-CM | POA: Diagnosis not present

## 2022-08-09 DIAGNOSIS — R269 Unspecified abnormalities of gait and mobility: Secondary | ICD-10-CM

## 2022-08-10 ENCOUNTER — Ambulatory Visit: Payer: Medicaid Other | Admitting: Physical Therapy

## 2022-08-10 ENCOUNTER — Other Ambulatory Visit: Payer: Self-pay | Admitting: Family Medicine

## 2022-08-10 DIAGNOSIS — E538 Deficiency of other specified B group vitamins: Secondary | ICD-10-CM

## 2022-08-10 MED ORDER — CYANOCOBALAMIN 2000 MCG PO TABS: 2000.0000 ug | ORAL_TABLET | Freq: Every day | ORAL | 3 refills | Status: DC

## 2022-08-10 NOTE — Telephone Encounter (Signed)
Provider sent to pharmacy.

## 2022-08-10 NOTE — Telephone Encounter (Signed)
Pt said that she was supposed to be on b12 but as she is getting her other prescriptions, she realized she hasn't gotten that one. Please send B12 to [facility]

## 2022-08-10 NOTE — Telephone Encounter (Signed)
Usually OTC, but sometimes insurance will cover cost if RX written. Okay to prescribe?

## 2022-08-10 NOTE — Addendum Note (Signed)
Addended bySilvio Pate on: 08/10/2022 01:17 PM   Modules accepted: Orders

## 2022-08-17 ENCOUNTER — Telehealth: Payer: Self-pay | Admitting: Physical Medicine and Rehabilitation

## 2022-08-17 ENCOUNTER — Ambulatory Visit: Payer: Medicaid Other | Admitting: Physical Therapy

## 2022-08-18 ENCOUNTER — Ambulatory Visit (INDEPENDENT_AMBULATORY_CARE_PROVIDER_SITE_OTHER): Payer: Medicaid Other | Admitting: Physical Medicine and Rehabilitation

## 2022-08-18 ENCOUNTER — Encounter: Payer: Self-pay | Admitting: Physical Medicine and Rehabilitation

## 2022-08-18 DIAGNOSIS — M47816 Spondylosis without myelopathy or radiculopathy, lumbar region: Secondary | ICD-10-CM

## 2022-08-18 DIAGNOSIS — M5116 Intervertebral disc disorders with radiculopathy, lumbar region: Secondary | ICD-10-CM | POA: Diagnosis not present

## 2022-08-18 DIAGNOSIS — M5416 Radiculopathy, lumbar region: Secondary | ICD-10-CM

## 2022-08-18 MED ORDER — DIAZEPAM 5 MG PO TABS: ORAL_TABLET | ORAL | 0 refills | Status: DC

## 2022-08-18 NOTE — Progress Notes (Unsigned)
Functional Pain Scale - descriptive words and definitions  Distressing (6)    Pain is present/unable to complete most ADLs limited by pain/sleep is difficult and active distraction is only marginal. Moderate range order  Average Pain 5  Lower back pain on right that radiates into the right leg

## 2022-08-18 NOTE — Progress Notes (Unsigned)
Brittany [name] - 57 y.o. female MRN [id number]  Date of birth: 1965/06/26  Office Visit Note: Visit Date: 08/18/2022 PCP: Clayborne Dana, NP Referred by: Clayborne Dana, NP  Subjective: Chief Complaint  Patient presents with   Lower Back - Pain   HPI: Brittany [name] is a 57 y.o. female who comes in today for evaluation of chronic, worsening and severe right sided back pain radiating to buttock and down posterolateral leg to foot. Pain ongoing for over a year, worsened the last few months. She reports severe pain to entire right leg. Pain worsens with prolonged sitting, walking and bending. She describes pain as throbbing and sore sensation, currently rates as 8 out of 10. Some relief of pain with home exercise regimen, rest and use of medications. PCP recently placed referral for formal physical therapy, she has not started treatments yet. Recent lumbar MRI imaging exhibits multi level degenerative changes, disc bulge at L4-L5 abuts the traversing bilateral L5 nerve roots in the lateral recesses. There is also right eccentric disc bulge displacing traversing right L5 nerve root in the lateral recess at L5-S1. No high grade spinal canal stenosis.  No history of lumbar surgery/injections. Issues with gait post stroke, she uses roolator walker to assist with ambulation. Previously evaluated by Dr. Annell Greening in 2020  Patient denies focal weakness, numbness and tingling. No recent trauma or falls.    Review of Systems  Musculoskeletal:  Positive for back pain.  Neurological:  Positive for tingling. Negative for focal weakness and weakness.  All other systems reviewed and are negative.  Otherwise per HPI.  Assessment & Plan: Visit Diagnoses:    ICD-10-CM   1. Lumbar radiculopathy  M54.16 Ambulatory referral to Physical Medicine Rehab    2. Intervertebral disc disorders with radiculopathy, lumbar region  M51.16 Ambulatory referral to Physical Medicine Rehab    3. Facet arthropathy, lumbar   M47.816 Ambulatory referral to Physical Medicine Rehab       Plan: Findings:  Chronic, worsening and severe right sided back pain radiating to buttock and down posterolateral leg to foot. I discussed recent lumbar MRI with patient today using imaging and spine model. Patient continues to have severe pain despite good conservative therapies such as home exercise regimen, rest and use of medications. Patients clinical presentation and exam are consistent with S1 nerve pattern. Next step is to perform diagnostic and hopefully right L5-S1 interlaminar epidural steroid injection under fluoroscopic guidance. She is not currently taking anticoagulant medications. If good relief of pain with injection we can repeat this procedure infrequently as needed. I discussed injection procedure with her in detail today. She voiced concerns regarding anxiety related to procedure. I prescribed pre-procedure Valium today for her to take day of injection. No red flag symptoms noted upon exam today.     Meds & Orders:  Meds ordered this encounter  Medications   diazepam (VALIUM) 5 MG tablet    Sig: Take one tablet by mouth with food one hour prior to procedure. May repeat 30 minutes prior if needed.    Dispense:  2 tablet    Refill:  0    Orders Placed This Encounter  Procedures   Ambulatory referral to Physical Medicine Rehab    Follow-up: Return for Right L5-S1 interlaminar epidural steroid injection.   Procedures: No procedures performed      Clinical History: CLINICAL DATA:  Low back pain. Lumbar radiculopathy. Low back pain radiating into the right leg with associated numbness and weakness.  EXAM: MRI LUMBAR SPINE WITHOUT CONTRAST   TECHNIQUE: Multiplanar, multisequence MR imaging of the lumbar spine was performed. No intravenous contrast was administered.   COMPARISON:  Lumbar spine radiographs 07/05/2022.   FINDINGS: Segmentation: Conventional numbering is assumed with 5 non-rib-bearing,  lumbar type vertebral bodies.   Alignment:  Normal.   Vertebrae:  Normal vertebral body heights and marrow signal.   Conus medullaris and cauda equina: Conus extends to the L1 level. Conus and cauda equina appear normal.   Paraspinal and other soft tissues: Mild fatty atrophy of the paraspinal muscles.   Disc levels:   T12-L1: Small disc bulge without spinal canal stenosis or neural foraminal narrowing.   L1-L2:  Normal.   L2-L3:  Normal.   L3-L4: Small disc bulge and mild bilateral facet arthropathy. No spinal canal stenosis or neural foraminal narrowing.   L4-L5: Disc bulge abuts the traversing bilateral L5 nerve roots in the lateral recesses. Facet arthropathy contributes to moderate bilateral neural foraminal narrowing.   L5-S1: Right eccentric disc bulge results in displacement of the traversing right L5 nerve root in the lateral recess. Facet arthropathy contributes to moderate bilateral neural foraminal narrowing.   IMPRESSION: 1. At L5-S1, right eccentric disc bulge displaces the traversing right L5 nerve root in the lateral recess. Moderate bilateral neural foraminal narrowing at this level. 2. At L4-L5, disc bulge abuts the traversing bilateral L5 nerve roots in the lateral recesses. Moderate bilateral neural foraminal narrowing at this level.     Electronically Signed   By: Orvan Falconer M.D.   On: 08/18/2022 09:20   She reports that she quit smoking about 6 years ago. Her smoking use included cigarettes. She has never used smokeless tobacco.  Recent Labs    07/05/22 1520  HGBA1C 6.2    Objective:  VS:  HT:    WT:   BMI:     BP:   HR: bpm  TEMP: ( )  RESP:  Physical Exam Vitals and nursing note reviewed.  HENT:     Head: Normocephalic and atraumatic.     Right Ear: External ear normal.     Left Ear: External ear normal.     Nose: Nose normal.     Mouth/Throat:     Mouth: Mucous membranes are moist.  Eyes:     Extraocular Movements:  Extraocular movements intact.  Cardiovascular:     Rate and Rhythm: Normal rate.     Pulses: Normal pulses.  Pulmonary:     Effort: Pulmonary effort is normal.  Abdominal:     General: Abdomen is flat. There is no distension.  Musculoskeletal:        General: Tenderness present.     Cervical back: Normal range of motion.     Comments: Patient rises from seated position to standing without difficulty. Good lumbar range of motion. No pain noted with facet loading. 5/5 strength noted with bilateral hip flexion, knee flexion/extension, ankle dorsiflexion/plantarflexion and EHL. No clonus noted bilaterally. No pain upon palpation of greater trochanters. No pain with internal/external rotation of bilateral hips. Sensation intact bilaterally. Dysesthesias noted to right S1 dermatome. Negative slump test bilaterally. Ambulates with walker, gait slow and unsteady.     Skin:    General: Skin is warm and dry.     Capillary Refill: Capillary refill takes less than 2 seconds.  Neurological:     General: No focal deficit present.     Mental Status: She is alert and oriented to person, place, and time.  Psychiatric:        Mood and Affect: Mood normal.        Behavior: Behavior normal.     Ortho Exam  Imaging: No results found.  Past Medical/Family/Surgical/Social History: Medications & Allergies reviewed per EMR, new medications updated. Patient Active Problem List   Diagnosis Date Noted   Aortic atherosclerosis (HCC) 07/10/2022   Irritable bowel syndrome 12/01/2021   Ganglion cyst of volar aspect of left wrist 05/20/2020   Trigger thumb of left hand 05/20/2020   Labral tear of shoulder, degenerative, right 05/20/2020   Anxiety 02/05/2019   Constipation 10/08/2018   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   B12 deficiency 12/19/2016   Cerebellar infarct (HCC)    Diastolic dysfunction    Marijuana abuse    Tachycardia    Hypokalemia    Stroke (cerebrum) (HCC)  12/05/2016   Occipital stroke (HCC) 12/05/2016   Hypertensive urgency 12/05/2016   Hypertension    Past Medical History:  Diagnosis Date   Anemia    Arthritis    trigger finger in left hand   Complication of anesthesia    per pt, hard to wake up!   Elevated cholesterol    Female bladder prolapse    per urologist, does not have prolaspe   GERD (gastroesophageal reflux disease)    Heart murmur    pt unsure.   Hiatal hernia    Hypertension    IBS (irritable bowel syndrome)    Multiple myeloma (HCC) 2005   had partial chemo   Peripheral neuropathy    SOB (shortness of breath) on exertion    uses an inhaler   Stroke (HCC) 2017   paralysis left arm/uses a walker   Family History  Problem Relation Age of Onset   Hypertension Mother    Sarcoidosis Mother        currently in remission    Diverticulitis Mother    Irritable bowel syndrome Mother    Liver cancer Mother    Hypertension Father    Stomach cancer Father    Congestive Heart Failure Father    Stroke Maternal Uncle    Scoliosis Brother    Colon cancer Neg Hx    Esophageal cancer Neg Hx    Colon polyps Neg Hx    Rectal cancer Neg Hx    Past Surgical History:  Procedure Laterality Date   BONE BIOPSY  2005   in her back   COLONOSCOPY     over 10 years x3   ESOPHAGOGASTRODUODENOSCOPY     incomplete-over 10 years ago    TEE WITHOUT CARDIOVERSION N/A 12/08/2016   Procedure: TRANSESOPHAGEAL ECHOCARDIOGRAM (TEE);  Surgeon: Quintella Reichert, MD;  Location: [facility] ENDOSCOPY;  Service: Cardiovascular;  Laterality: N/A;   TUBAL LIGATION     UPPER GASTROINTESTINAL ENDOSCOPY     WISDOM TOOTH EXTRACTION     Social History   Occupational History   Occupation: Disabled  Tobacco Use   Smoking status: Former    Types: Cigarettes    Quit date: 12/06/2015    Years since quitting: 6.7   Smokeless tobacco: Never  Vaping Use   Vaping Use: Never used  Substance and Sexual Activity   Alcohol use: Not Currently   Drug use: Not  Currently    Types: Marijuana    Comment: in the past    Sexual activity: Not Currently

## 2022-08-22 ENCOUNTER — Ambulatory Visit: Payer: Medicaid Other | Admitting: Physical Therapy

## 2022-08-24 ENCOUNTER — Other Ambulatory Visit: Payer: Self-pay

## 2022-08-24 ENCOUNTER — Encounter: Payer: Self-pay | Admitting: Physical Therapy

## 2022-08-24 ENCOUNTER — Ambulatory Visit: Payer: Medicaid Other | Attending: Family Medicine | Admitting: Physical Therapy

## 2022-08-24 DIAGNOSIS — M5441 Lumbago with sciatica, right side: Secondary | ICD-10-CM | POA: Diagnosis present

## 2022-08-24 DIAGNOSIS — G8929 Other chronic pain: Secondary | ICD-10-CM | POA: Insufficient documentation

## 2022-08-24 DIAGNOSIS — M6281 Muscle weakness (generalized): Secondary | ICD-10-CM | POA: Diagnosis present

## 2022-08-24 DIAGNOSIS — M62838 Other muscle spasm: Secondary | ICD-10-CM | POA: Insufficient documentation

## 2022-08-24 DIAGNOSIS — R2689 Other abnormalities of gait and mobility: Secondary | ICD-10-CM | POA: Diagnosis present

## 2022-08-24 NOTE — Therapy (Signed)
OUTPATIENT PHYSICAL THERAPY THORACOLUMBAR EVALUATION   Patient Name: Ronada Cristiano MRN: [id number] DOB:1965-06-01, 57 y.o., female Today's Date: 08/24/2022  END OF SESSION:  PT End of Session - 08/24/22 1530     Visit Number 1    Date for PT Re-Evaluation 10/05/22    Authorization Type [employer] Medicaid    PT Start Time 1530    PT Stop Time 1639    PT Time Calculation (min) 69 min    Activity Tolerance Patient tolerated treatment well;Patient limited by pain    Behavior During Therapy WFL for tasks assessed/performed             Past Medical History:  Diagnosis Date   Anemia    Arthritis    trigger finger in left hand   Complication of anesthesia    per pt, hard to wake up!   Elevated cholesterol    Female bladder prolapse    per urologist, does not have prolaspe   GERD (gastroesophageal reflux disease)    Heart murmur    pt unsure.   Hiatal hernia    Hypertension    IBS (irritable bowel syndrome)    Multiple myeloma (HCC) 2005   had partial chemo   Peripheral neuropathy    SOB (shortness of breath) on exertion    uses an inhaler   Stroke [facility]) 2017   paralysis left arm/uses a walker   Past Surgical History:  Procedure Laterality Date   BONE BIOPSY  2005   in her back   COLONOSCOPY     over 10 years x3   ESOPHAGOGASTRODUODENOSCOPY     incomplete-over 10 years ago    TEE WITHOUT CARDIOVERSION N/A 12/08/2016   Procedure: TRANSESOPHAGEAL ECHOCARDIOGRAM (TEE);  Surgeon: Quintella Reichert, MD;  Location: [facility] ENDOSCOPY;  Service: Cardiovascular;  Laterality: N/A;   TUBAL LIGATION     UPPER GASTROINTESTINAL ENDOSCOPY     WISDOM TOOTH EXTRACTION     Patient Active Problem List   Diagnosis Date Noted   Aortic atherosclerosis (HCC) 07/10/2022   Irritable bowel syndrome 12/01/2021   Ganglion cyst of volar aspect of left wrist 05/20/2020   Trigger thumb of left hand 05/20/2020   Labral tear of shoulder, degenerative, right 05/20/2020   Anxiety 02/05/2019    Constipation 10/08/2018   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   B12 deficiency 12/19/2016   Cerebellar infarct (HCC)    Diastolic dysfunction    Marijuana abuse    Tachycardia    Hypokalemia    Stroke (cerebrum) (HCC) 12/05/2016   Occipital stroke (HCC) 12/05/2016   Hypertensive urgency 12/05/2016   Hypertension     PCP: Clayborne Dana, NP   REFERRING PROVIDER: Clayborne Dana, NP   REFERRING DIAG: [phone] (ICD-10-CM) - Chronic right-sided low back pain with right-sided sciatica   THERAPY DIAG:  Chronic right-sided low back pain with right-sided sciatica  Muscle weakness (generalized)  Other abnormalities of gait and mobility  Other muscle spasm  RATIONALE FOR EVALUATION AND TREATMENT: Rehabilitation  ONSET DATE: chronic for 1.5 yrs, with recent exacerbation  since late February 2024  NEXT MD VISIT: 10/09/22   SUBJECTIVE:  SUBJECTIVE STATEMENT: Pt reports she has had pain for over 1.5 yrs but pain had been intermittent until recently. She describes radiating, throbbing and vibrating pain that starts in her R low back/upper buttock and extends through hip and down into ankle and foot. She also notes intermittent numbness and tingling down the R LE, but states that she also has peripheral neuropathy which causes numbness and tingling in her hands and feet. She states she feels like her body fluctuates between issues with her UB/UEs and her LB/LEs but never fully gives her a break. Specialist consult with Blenda Mounts recommending possible ESI.  PAIN: Are you having pain? Yes: NPRS scale: 6-7/10 Pain location: R buttock; with intermittent pain, numbness and tingling down  R LE Pain description: pressure, throbbing, stabbing, burning & tingling,  spaams/twitching Aggravating factors: everything - any movement & all HH chores, bending is esp hard Relieving factors: being still, esp in reclined position with 3-4 pillows behind herl  PERTINENT HISTORY:  CVA - L hemiparesis, peripheral neuropathy, multiple myeloma, HTN, hiatal hernia, GERD, HLD, IBS, anemia, anxiety, R shoulder labral tear  PRECAUTIONS: None  WEIGHT BEARING RESTRICTIONS: No  FALLS:  Has patient fallen in last 6 months? No  LIVING ENVIRONMENT: Lives with: lives alone Lives in: House/apartment Stairs: No Has following equipment at home: Dan Humphreys - 4 wheeled, shower chair, and walking stick ; B AFOs - has not been wearing the braces for the past year due discomfort from braces rubbing against callouses under her feet/toes  OCCUPATION: On disability  PLOF: Independent and Leisure: working in the garden/yard, crafts  PATIENT GOALS: "Get where I can function w/o hurting so much."   OBJECTIVE: (objective measures completed at initial evaluation unless otherwise dated)  DIAGNOSTIC FINDINGS:  08/09/22 - Lumbar MRI: IMPRESSION: 1. At L5-S1, right eccentric disc bulge displaces the traversing right L5 nerve root in the lateral recess. Moderate bilateral neural foraminal narrowing at this level. 2. At L4-L5, disc bulge abuts the traversing bilateral L5 nerve roots in the lateral recesses. Moderate bilateral neural foraminal narrowing at this level.  07/05/22 - Lumbar x-ray: IMPRESSION: 1. No acute abnormality in lumbar spine. 2. Mild degenerative changes. 3. Aortic atherosclerosis.  PATIENT SURVEYS:  Modified Oswestry 32 / 50 = 64.0 %  FOTO Lumbar = 41, predicted = 48  SCREENING FOR RED FLAGS: Bowel or bladder incontinence: Yes: chronic Spinal tumors: No Cauda equina syndrome: No Compression fracture: No Abdominal aneurysm: No  COGNITION:  Overall cognitive status: Within functional limits for tasks assessed    SENSATION: Peripheral neuropathy  MUSCLE  LENGTH: Hamstrings: mild tight R ITB: mod tight B Piriformis: mild/mod tight B Hip flexors: mild tight R Quads: mild tight R Heelcord: NT  POSTURE:  Patient sits with severe posterior left lateral lean.  In standing, she demonstrates increased weight shift to left leg with right knee flexed in stance.  PALPATION: TTP throughout R glutes, piriformis and proximal hamstrings  LUMBAR ROM:   Active  Eval *  Flexion Unable  Extension 50% limited  Right lateral flexion Hand to lateral knee  Left lateral flexion Hand to lateral knee  Right rotation 75% limited  Left rotation 75% limited  (Blank rows = not tested) *08/24/22 - ROM assessment limited by patient's limited balance and fear of letting go from rollator.  Increased pain reported with all attempted movement  LOWER EXTREMITY ROM:    B LE ROM grossly WFL  LOWER EXTREMITY MMT:    MMT Right eval Left eval  Hip  flexion 4- 4  Hip extension 2 2  Hip abduction 3- 3+  Hip adduction 2+ 3-  Hip internal rotation 4- 4-  Hip external rotation 3+ 4-  Knee flexion 4- 4  Knee extension 4- 4  Ankle dorsiflexion 4 4  Ankle plantarflexion    Ankle inversion    Ankle eversion     (Blank rows = not tested)  LUMBAR SPECIAL TESTS:  Straight leg raise test: Positive and FABER test: Positive  FUNCTIONAL TESTS: TBA in upcoming visits 5 times sit to stand:   Timed up and go (TUG):   10 meter walk test:   Sharlene Motts Balance Scale:   Dynamic Gait Index:    GAIT: Distance walked: 60 ft Assistive device utilized: Environmental consultant - 4 wheeled Level of assistance: Modified independence and SBA Gait pattern: decreased stance time- Right, knee flexed in stance- Right, antalgic, and lateral lean- Left Comments:    TODAY'S TREATMENT:   08/24/22 Initial eval THERAPEUTIC EXERCISE: to improve flexibility, strength and mobility.  Verbal and tactile cues throughout for technique.  Hooklying R SKTC x 30" Hooklying R figure-4 piriformis stretch with slight  overpressure x 30" Hooklying R KTOS piriformis stretch x 30" Hooklying B LTR 5 x 10" Hooklying posterior pelvic tilt 10 x 5"   PATIENT EDUCATION:  Education details: PT eval findings, anticipated POC, need for further assessment of balance, and initial HEP Person educated: Patient Education method: Explanation, Demonstration, Verbal cues, and Handouts Education comprehension: verbalized understanding, returned demonstration, verbal cues required, and needs further education  HOME EXERCISE PROGRAM: Access Code: [email or web address] URL: [email or web address] Date: 08/24/2022 Prepared by: Glenetta Hew  Exercises - Hooklying Single Knee to Chest  - 2-3 x daily - 7 x weekly - 3 reps - 30 sec hold - Supine Figure 4 Piriformis Stretch  - 2-3 x daily - 7 x weekly - 3 reps - 30 sec hold - Supine Piriformis Stretch with Foot on Ground  - 2-3 x daily - 7 x weekly - 3 reps - 30 sec hold - Supine Lower Trunk Rotation  - 2-3 x daily - 7 x weekly - 5 reps - 10 sec hold - Supine Posterior Pelvic Tilt  - 2-3 x daily - 7 x weekly - 2 sets - 10 reps - 5 sec hold   ASSESSMENT:  CLINICAL IMPRESSION: Lataysha Zein is a 57 y.o. female who was seen today for physical therapy evaluation and treatment for chronic R-sided LBP with R-sided sciatica.  She reports initial onset of pain ~1.5 years ago with intermittent presentation, worsening since February of this year.  Pain has become so severe that she has had to have her parents come and help her out.  Current deficits include R-sided LBP/upper buttock pain with intermittent R LE radiculopathy, abnormal posture, increased muscle tension with TTP in R glutes/piriformis and proximal hamstrings, antalgic gait, and core and proximal LE weakness.  Abnormal movement patterns and antalgic gait place her at increased risk for falls and will plan for formal balance testing in upcoming visits.  [place] will benefit from skilled PT to address above deficits to improve  mobility and activity tolerance with decreased pain interference.   OBJECTIVE IMPAIRMENTS: Abnormal gait, decreased activity tolerance, decreased balance, decreased endurance, decreased knowledge of condition, decreased knowledge of use of DME, decreased mobility, difficulty walking, decreased ROM, decreased strength, decreased safety awareness, increased fascial restrictions, impaired perceived functional ability, increased muscle spasms, impaired flexibility, impaired sensation, improper body mechanics, postural dysfunction, and pain.   ACTIVITY LIMITATIONS:  carrying, lifting, bending, sitting, standing, squatting, sleeping, stairs, transfers, bed mobility, bathing, toileting, dressing, locomotion level, and caring for others  PARTICIPATION LIMITATIONS: meal prep, cleaning, laundry, driving, shopping, community activity, and yard work  PERSONAL FACTORS: Fitness, Past/current experiences, Time since onset of injury/illness/exacerbation, and 3+ comorbidities: CVA - L hemiparesis, peripheral neuropathy, multiple myeloma, HTN, hiatal hernia, GERD, HLD, IBS, anemia, anxiety, R shoulder labral tear  are also affecting patient's functional outcome.   REHAB POTENTIAL: Good  CLINICAL DECISION MAKING: Evolving/moderate complexity  EVALUATION COMPLEXITY: Moderate   GOALS: Goals reviewed with patient? Yes  SHORT TERM GOALS: Target date: 09/14/2022  Patient will be independent with initial HEP to improve outcomes and carryover.  Baseline:  Goal status: INITIAL  2.  Patient will report centralization of radicular symptoms.  Baseline:  Goal status: INITIAL  3.  Patient will ambulate with improved posture and symmetrical step-through pattern and improved gait mechanics with 4WW or LRAD. Baseline:  Goal status: INITIAL   LONG TERM GOALS: Target date: 10/05/2022  Patient will be independent with ongoing/advanced HEP for self-management at home.  Baseline:  Goal status: INITIAL  2.  Patient  will report 75% improvement in low back pain to improve QOL.  Baseline: 6-7/10 Goal status: INITIAL  3.  Patient to demonstrate ability to achieve and maintain good spinal alignment/posturing and body mechanics needed for daily activities. Baseline:  Goal status: INITIAL  4.  Patient will demonstrate functional pain free lumbar ROM to perform ADLs.   Baseline:  Goal status: INITIAL  5.  Patient will demonstrate improved B LE strength to >/= 4 to 4+/5 for improved stability and ease of mobility . Baseline:  Goal status: INITIAL  6.  Patient will report >/= 48 on lumbar FOTO to demonstrate improved functional ability.  Baseline: 41 Goal status: INITIAL   7. Patient will report </= 52% on Modified Oswestry to demonstrate improved functional ability with decreased pain interference. Baseline: 32 / 50 = 64.0 % Goal status: INITIAL  8.  Patient to report ability to perform ADLs, household, and leisure activities without limitation due to LBP or R LE pain, LOM or weakness. Baseline: pt's parents are helping her with most activities Goal status: INITIAL  9.  Patient will improve DGI score to >/= 19/24 to improve gait stability and reduce risk for falls Baseline: TBA Goal status: INITIAL    PLAN:  PT FREQUENCY: 2x/week  PT DURATION: 6 weeks  PLANNED INTERVENTIONS: Therapeutic exercises, Therapeutic activity, Neuromuscular re-education, Balance training, Gait training, Patient/Family education, Self Care, Joint mobilization, Stair training, DME instructions, Aquatic Therapy, Dry Needling, Spinal manipulation, Spinal mobilization, Cryotherapy, Moist heat, Taping, Traction, Ultrasound, Ionotophoresis 4mg /ml Dexamethasone, Manual therapy, and Re-evaluation  PLAN FOR NEXT SESSION: review initial HEP; progress lumbopelvic flexibility and strengthening; MT to R glutes/piriformis and proximal HS as indicated; next visit with PT: complete balance assessment - 5xSTS, TUG, , Berg & DGI vs  FGA - update goals as indicated   Marry Guan, PT 08/24/2022, 6:35 PM

## 2022-08-28 ENCOUNTER — Ambulatory Visit: Payer: Medicaid Other | Admitting: Orthopedic Surgery

## 2022-08-29 ENCOUNTER — Ambulatory Visit: Payer: Medicaid Other

## 2022-08-31 ENCOUNTER — Ambulatory Visit: Payer: Medicaid Other

## 2022-09-05 ENCOUNTER — Encounter: Payer: Self-pay | Admitting: Physical Therapy

## 2022-09-05 ENCOUNTER — Ambulatory Visit: Payer: Medicaid Other | Admitting: Physical Therapy

## 2022-09-05 DIAGNOSIS — R2689 Other abnormalities of gait and mobility: Secondary | ICD-10-CM

## 2022-09-05 DIAGNOSIS — M6281 Muscle weakness (generalized): Secondary | ICD-10-CM

## 2022-09-05 DIAGNOSIS — G8929 Other chronic pain: Secondary | ICD-10-CM

## 2022-09-05 DIAGNOSIS — M62838 Other muscle spasm: Secondary | ICD-10-CM

## 2022-09-05 DIAGNOSIS — M5441 Lumbago with sciatica, right side: Secondary | ICD-10-CM | POA: Diagnosis not present

## 2022-09-05 NOTE — Therapy (Signed)
OUTPATIENT PHYSICAL THERAPY TREATMENT   Patient Name: Brittany [name] MRN: [id number] DOB:[birth year], 57 y.o., female Today's Date: 09/05/2022  END OF SESSION:  PT End of Session - 09/05/22 1543     Visit Number 2    Date for PT Re-Evaluation 10/05/22    Authorization Type [employer]    PT Start Time 1543   Patient arrived late   PT Stop Time 1616    PT Time Calculation (min) 33 min    Activity Tolerance Patient tolerated treatment well    Behavior During Therapy WFL for tasks assessed/performed              Past Medical History:  Diagnosis Date   Anemia    Arthritis    trigger finger in left hand   Complication of anesthesia    per pt, hard to wake up!   Elevated cholesterol    Female bladder prolapse    per urologist, does not have prolaspe   GERD (gastroesophageal reflux disease)    Heart murmur    pt unsure.   Hiatal hernia    Hypertension    IBS (irritable bowel syndrome)    Multiple myeloma (HCC) 2005   had partial chemo   Peripheral neuropathy    SOB (shortness of breath) on exertion    uses an inhaler   Stroke [facility]) 2017   paralysis left arm/uses a walker   Past Surgical History:  Procedure Laterality Date   BONE BIOPSY  2005   in her back   COLONOSCOPY     over 10 years x3   ESOPHAGOGASTRODUODENOSCOPY     incomplete-over 10 years ago    TEE WITHOUT CARDIOVERSION N/A 12/08/2016   Procedure: TRANSESOPHAGEAL ECHOCARDIOGRAM (TEE);  Surgeon: Quintella Reichert, MD;  Location: [facility] ENDOSCOPY;  Service: Cardiovascular;  Laterality: N/A;   TUBAL LIGATION     UPPER GASTROINTESTINAL ENDOSCOPY     WISDOM TOOTH EXTRACTION     Patient Active Problem List   Diagnosis Date Noted   Aortic atherosclerosis (HCC) 07/10/2022   Irritable bowel syndrome 12/01/2021   Ganglion cyst of volar aspect of left wrist 05/20/2020   Trigger thumb of left hand 05/20/2020   Labral tear of shoulder, degenerative, right 05/20/2020   Anxiety 02/05/2019   Constipation 10/08/2018    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   B12 deficiency 12/19/2016   Cerebellar infarct (HCC)    Diastolic dysfunction    Marijuana abuse    Tachycardia    Hypokalemia    Stroke (cerebrum) (HCC) 12/05/2016   Occipital stroke (HCC) 12/05/2016   Hypertensive urgency 12/05/2016   Hypertension     PCP: Clayborne Dana, NP   REFERRING PROVIDER: Clayborne Dana, NP   REFERRING DIAG: [phone] (ICD-10-CM) - Chronic right-sided low back pain with right-sided sciatica   THERAPY DIAG:  Chronic right-sided low back pain with right-sided sciatica  Muscle weakness (generalized)  Other abnormalities of gait and mobility  Other muscle spasm  RATIONALE FOR EVALUATION AND TREATMENT: Rehabilitation  ONSET DATE: chronic for 1.5 yrs, with recent exacerbation  since late February 2024  NEXT MD VISIT: 10/09/22   SUBJECTIVE:  SUBJECTIVE STATEMENT: Pt reports her ankles are feeling weaker than usual because she has been in bed all week due to an IBS flare-up.  PAIN: Are you having pain? Yes: NPRS scale: 5/10 Pain location: L ankle Pain description: aching Aggravating factors: walking Relieving factors: rest, sitting down  PERTINENT HISTORY:  CVA - L hemiparesis, peripheral neuropathy, multiple myeloma, HTN, hiatal hernia, GERD, HLD, IBS, anemia, anxiety, R shoulder labral tear  PRECAUTIONS: None  WEIGHT BEARING RESTRICTIONS: No  FALLS:  Has patient fallen in last 6 months? No  LIVING ENVIRONMENT: Lives with: lives alone Lives in: House/apartment Stairs: No Has following equipment at home: Dan Humphreys - 4 wheeled, shower chair, and walking stick ; B AFOs - has not been wearing the braces for the past year due discomfort from braces rubbing against callouses under her  feet/toes  OCCUPATION: On disability  PLOF: Independent and Leisure: working in the garden/yard, crafts  PATIENT GOALS: "Get where I can function w/o hurting so much."   OBJECTIVE: (objective measures completed at initial evaluation unless otherwise dated)  DIAGNOSTIC FINDINGS:  08/09/22 - Lumbar MRI: IMPRESSION: 1. At L5-S1, right eccentric disc bulge displaces the traversing right L5 nerve root in the lateral recess. Moderate bilateral neural foraminal narrowing at this level. 2. At L4-L5, disc bulge abuts the traversing bilateral L5 nerve roots in the lateral recesses. Moderate bilateral neural foraminal narrowing at this level.  07/05/22 - Lumbar x-ray: IMPRESSION: 1. No acute abnormality in lumbar spine. 2. Mild degenerative changes. 3. Aortic atherosclerosis.  PATIENT SURVEYS:  Modified Oswestry 32 / 50 = 64.0 %  FOTO Lumbar = 41, predicted = 48  SCREENING FOR RED FLAGS: Bowel or bladder incontinence: Yes: chronic Spinal tumors: No Cauda equina syndrome: No Compression fracture: No Abdominal aneurysm: No  COGNITION:  Overall cognitive status: Within functional limits for tasks assessed    SENSATION: Peripheral neuropathy  MUSCLE LENGTH: Hamstrings: mild tight R ITB: mod tight B Piriformis: mild/mod tight B Hip flexors: mild tight R Quads: mild tight R Heelcord: NT  POSTURE:  Patient sits with severe posterior left lateral lean.  In standing, she demonstrates increased weight shift to left leg with right knee flexed in stance.  PALPATION: TTP throughout R glutes, piriformis and proximal hamstrings  LUMBAR ROM:   Active  Eval *  Flexion Unable  Extension 50% limited  Right lateral flexion Hand to lateral knee  Left lateral flexion Hand to lateral knee  Right rotation 75% limited  Left rotation 75% limited  (Blank rows = not tested) *08/24/22 - ROM assessment limited by patient's limited balance and fear of letting go from rollator.  Increased pain  reported with all attempted movement  LOWER EXTREMITY ROM:    B LE ROM grossly WFL  LOWER EXTREMITY MMT:    MMT Right eval Left eval  Hip flexion 4- 4  Hip extension 2 2  Hip abduction 3- 3+  Hip adduction 2+ 3-  Hip internal rotation 4- 4-  Hip external rotation 3+ 4-  Knee flexion 4- 4  Knee extension 4- 4  Ankle dorsiflexion 4 4  Ankle plantarflexion    Ankle inversion    Ankle eversion     (Blank rows = not tested)  LUMBAR SPECIAL TESTS:  Straight leg raise test: Positive and FABER test: Positive  FUNCTIONAL TESTS: (09/05/22) 5 times sit to stand: 21.21 sec with hands pushing from knees for a few reps Timed up and go (TUG): 27.59 sec with rollator 10 meter walk test: 25.59  sec with rollator, Gait speed = 1.28 ft/sec Berg Balance Scale: 27/56, < 36 high risk for falls (close to 100%) Dynamic Gait Index: 7/24, Scores of 19 or less are predictive of falls in older community living adults  GAIT: Distance walked: 60 ft Assistive device utilized: Environmental consultant - 4 wheeled Level of assistance: Modified independence and SBA Gait pattern: decreased stance time- Right, knee flexed in stance- Right, antalgic, and lateral lean- Left Comments:    TODAY'S TREATMENT:   09/05/22 THERAPEUTIC ACTIVITIES: 5xSTS = 21.21 sec with hands pushing from knees for a few reps TUG = 27.59 sec with rollator = 25.59 sec with rollator Gait speed = 1.28 ft/sec with rollator Berg = 27/56, < 36 high risk for falls (close to 100%)  DGI = 7/24, Scores of 19 or less are predictive of falls in older community living adults    08/24/22 Initial eval THERAPEUTIC EXERCISE: to improve flexibility, strength and mobility.  Verbal and tactile cues throughout for technique.  Hooklying R SKTC x 30" Hooklying R figure-4 piriformis stretch with slight overpressure x 30" Hooklying R KTOS piriformis stretch x 30" Hooklying B LTR 5 x 10" Hooklying posterior pelvic tilt 10 x 5"   PATIENT EDUCATION:   Education details: PT eval findings, anticipated POC, need for further assessment of balance, and initial HEP Person educated: Patient Education method: Explanation, Demonstration, Verbal cues, and Handouts Education comprehension: verbalized understanding, returned demonstration, verbal cues required, and needs further education  HOME EXERCISE PROGRAM: Access Code: [email or web address] URL: [email or web address] Date: 08/24/2022 Prepared by: Glenetta Hew  Exercises - Hooklying Single Knee to Chest  - 2-3 x daily - 7 x weekly - 3 reps - 30 sec hold - Supine Figure 4 Piriformis Stretch  - 2-3 x daily - 7 x weekly - 3 reps - 30 sec hold - Supine Piriformis Stretch with Foot on Ground  - 2-3 x daily - 7 x weekly - 3 reps - 30 sec hold - Supine Lower Trunk Rotation  - 2-3 x daily - 7 x weekly - 5 reps - 10 sec hold - Supine Posterior Pelvic Tilt  - 2-3 x daily - 7 x weekly - 2 sets - 10 reps - 5 sec hold   ASSESSMENT:  CLINICAL IMPRESSION: Shanike canceled and no showed for her to first 2 scheduled visits, reporting she was bedridden due to a flareup of her IBS.  She notes increased left ankle instability and pain today following her time bedridden.  Completed balance testing with all standardized balance test indicating a high risk for falls.  Results reviewed with patient reemphasizing need for use of rollator for all ambulation due to high fall risk.  Session time limited due to patient late arrival therefore unable to review initial HEP -will plan to initiate next visit with this.  OBJECTIVE IMPAIRMENTS: Abnormal gait, decreased activity tolerance, decreased balance, decreased endurance, decreased knowledge of condition, decreased knowledge of use of DME, decreased mobility, difficulty walking, decreased ROM, decreased strength, decreased safety awareness, increased fascial restrictions, impaired perceived functional ability, increased muscle spasms, impaired flexibility, impaired  sensation, improper body mechanics, postural dysfunction, and pain.   ACTIVITY LIMITATIONS: carrying, lifting, bending, sitting, standing, squatting, sleeping, stairs, transfers, bed mobility, bathing, toileting, dressing, locomotion level, and caring for others  PARTICIPATION LIMITATIONS: meal prep, cleaning, laundry, driving, shopping, community activity, and yard work  PERSONAL FACTORS: Fitness, Past/current experiences, Time since onset of injury/illness/exacerbation, and 3+ comorbidities: CVA - L hemiparesis, peripheral neuropathy, multiple myeloma, HTN, hiatal hernia,  GERD, HLD, IBS, anemia, anxiety, R shoulder labral tear  are also affecting patient's functional outcome.   REHAB POTENTIAL: Good  CLINICAL DECISION MAKING: Evolving/moderate complexity  EVALUATION COMPLEXITY: Moderate   GOALS: Goals reviewed with patient? Yes  SHORT TERM GOALS: Target date: 09/14/2022  Patient will be independent with initial HEP to improve outcomes and carryover.  Baseline:  Goal status: IN PROGRESS  2.  Patient will report centralization of radicular symptoms.  Baseline:  Goal status: IN PROGRESS  3.  Patient will ambulate with improved posture and symmetrical step-through pattern and improved gait mechanics with 4WW or LRAD. Baseline:  Goal status: IN PROGRESS   LONG TERM GOALS: Target date: 10/05/2022  Patient will be independent with ongoing/advanced HEP for self-management at home.  Baseline:  Goal status: IN PROGRESS  2.  Patient will report 75% improvement in low back pain to improve QOL.  Baseline: 6-7/10 Goal status: IN PROGRESS  3.  Patient to demonstrate ability to achieve and maintain good spinal alignment/posturing and body mechanics needed for daily activities. Baseline:  Goal status: IN PROGRESS  4.  Patient will demonstrate functional pain free lumbar ROM to perform ADLs.   Baseline:  Goal status: IN PROGRESS  5.  Patient will demonstrate improved B LE strength to  >/= 4 to 4+/5 for improved stability and ease of mobility . Baseline:  Goal status: IN PROGRESS  6.  Patient will report >/= 48 on lumbar FOTO to demonstrate improved functional ability.  Baseline: 41 Goal status: IN PROGRESS   7. Patient will report </= 52% on Modified Oswestry to demonstrate improved functional ability with decreased pain interference. Baseline: 32 / 50 = 64.0 % Goal status: IN PROGRESS  8.  Patient to report ability to perform ADLs, household, and leisure activities without limitation due to LBP or R LE pain, LOM or weakness. Baseline: pt's parents are helping her with most activities Goal status: IN PROGRESS  9.  Patient will improve DGI score to >/= 19/24 to improve gait stability and reduce risk for falls Baseline: 7/24 Goal status: IN PROGRESS  10.  Patient will improve Berg score to >/= 36/56 to improve safety and stability with ADLs in standing and reduce risk for falls Baseline: 27/56 Goal status: INITIAL    PLAN:  PT FREQUENCY: 2x/week  PT DURATION: 6 weeks  PLANNED INTERVENTIONS: Therapeutic exercises, Therapeutic activity, Neuromuscular re-education, Balance training, Gait training, Patient/Family education, Self Care, Joint mobilization, Stair training, DME instructions, Aquatic Therapy, Dry Needling, Spinal manipulation, Spinal mobilization, Cryotherapy, Moist heat, Taping, Traction, Ultrasound, Ionotophoresis 4mg /ml Dexamethasone, Manual therapy, and Re-evaluation  PLAN FOR NEXT SESSION: review initial HEP; progress lumbopelvic flexibility and strengthening; MT to R glutes/piriformis and proximal HS as indicated   Marry Guan, PT 09/05/2022, 4:50 PM

## 2022-09-26 ENCOUNTER — Ambulatory Visit: Payer: Medicaid Other | Attending: Family Medicine | Admitting: Physical Therapy

## 2022-09-26 ENCOUNTER — Encounter: Payer: Self-pay | Admitting: Physical Therapy

## 2022-09-26 DIAGNOSIS — M5441 Lumbago with sciatica, right side: Secondary | ICD-10-CM | POA: Diagnosis present

## 2022-09-26 DIAGNOSIS — M62838 Other muscle spasm: Secondary | ICD-10-CM | POA: Diagnosis present

## 2022-09-26 DIAGNOSIS — G8929 Other chronic pain: Secondary | ICD-10-CM | POA: Insufficient documentation

## 2022-09-26 DIAGNOSIS — M6281 Muscle weakness (generalized): Secondary | ICD-10-CM | POA: Diagnosis present

## 2022-09-26 DIAGNOSIS — R2689 Other abnormalities of gait and mobility: Secondary | ICD-10-CM | POA: Insufficient documentation

## 2022-09-26 NOTE — Therapy (Addendum)
OUTPATIENT PHYSICAL THERAPY TREATMENT/Discharge   Patient Name: Brittany [name] MRN: [id number] DOB:[birth year], 57 y.o., female Today's Date: 09/26/2022  END OF SESSION:  PT End of Session - 09/26/22 1531     Visit Number 3    Date for PT Re-Evaluation 10/05/22    Authorization Type [employer]    PT Start Time 1531    PT Stop Time 1612    PT Time Calculation (min) 41 min    Activity Tolerance Patient tolerated treatment well    Behavior During Therapy WFL for tasks assessed/performed              Past Medical History:  Diagnosis Date   Anemia    Arthritis    trigger finger in left hand   Complication of anesthesia    per pt, hard to wake up!   Elevated cholesterol    Female bladder prolapse    per urologist, does not have prolaspe   GERD (gastroesophageal reflux disease)    Heart murmur    pt unsure.   Hiatal hernia    Hypertension    IBS (irritable bowel syndrome)    Multiple myeloma (HCC) 2005   had partial chemo   Peripheral neuropathy    SOB (shortness of breath) on exertion    uses an inhaler   Stroke [facility]) 2017   paralysis left arm/uses a walker   Past Surgical History:  Procedure Laterality Date   BONE BIOPSY  2005   in her back   COLONOSCOPY     over 10 years x3   ESOPHAGOGASTRODUODENOSCOPY     incomplete-over 10 years ago    TEE WITHOUT CARDIOVERSION N/A 12/08/2016   Procedure: TRANSESOPHAGEAL ECHOCARDIOGRAM (TEE);  Surgeon: Quintella Reichert, MD;  Location: [facility] ENDOSCOPY;  Service: Cardiovascular;  Laterality: N/A;   TUBAL LIGATION     UPPER GASTROINTESTINAL ENDOSCOPY     WISDOM TOOTH EXTRACTION     Patient Active Problem List   Diagnosis Date Noted   Aortic atherosclerosis (HCC) 07/10/2022   Irritable bowel syndrome 12/01/2021   Ganglion cyst of volar aspect of left wrist 05/20/2020   Trigger thumb of left hand 05/20/2020   Labral tear of shoulder, degenerative, right 05/20/2020   Anxiety 02/05/2019   Constipation 10/08/2018   Late  effect of cerebrovascular accident (CVA) 09/06/2018   B12 deficiency 12/19/2016   Cerebellar infarct (HCC)    Diastolic dysfunction    Marijuana abuse    Tachycardia    Hypokalemia    Stroke (cerebrum) (HCC) 12/05/2016   Occipital stroke (HCC) 12/05/2016   Hypertensive urgency 12/05/2016   Hypertension     PCP: Clayborne Dana, NP   REFERRING PROVIDER: Clayborne Dana, NP   REFERRING DIAG: [phone] (ICD-10-CM) - Chronic right-sided low back pain with right-sided sciatica   THERAPY DIAG:  Chronic right-sided low back pain with right-sided sciatica  Muscle weakness (generalized)  Other abnormalities of gait and mobility  Other muscle spasm  RATIONALE FOR EVALUATION AND TREATMENT: Rehabilitation  ONSET DATE: chronic for 1.5 yrs, with recent exacerbation  since late February 2024  NEXT MD VISIT: 10/09/22   SUBJECTIVE:  SUBJECTIVE STATEMENT: Pt denies pian and states "no complaints today". She states the HEP is going well.  PAIN: Are you having pain? No  PERTINENT HISTORY:  CVA - L hemiparesis, peripheral neuropathy, multiple myeloma, HTN, hiatal hernia, GERD, HLD, IBS, anemia, anxiety, R shoulder labral tear  PRECAUTIONS: None  WEIGHT BEARING RESTRICTIONS: No  FALLS:  Has patient fallen in last 6 months? No  LIVING ENVIRONMENT: Lives with: lives alone Lives in: House/apartment Stairs: No Has following equipment at home: Dan Humphreys - 4 wheeled, shower chair, and walking stick ; B AFOs - has not been wearing the braces for the past year due discomfort from braces rubbing against callouses under her feet/toes  OCCUPATION: On disability  PLOF: Independent and Leisure: working in the garden/yard, crafts  PATIENT GOALS: "Get where I can function w/o hurting so  much."   OBJECTIVE: (objective measures completed at initial evaluation unless otherwise dated)  DIAGNOSTIC FINDINGS:  08/09/22 - Lumbar MRI: IMPRESSION: 1. At L5-S1, right eccentric disc bulge displaces the traversing right L5 nerve root in the lateral recess. Moderate bilateral neural foraminal narrowing at this level. 2. At L4-L5, disc bulge abuts the traversing bilateral L5 nerve roots in the lateral recesses. Moderate bilateral neural foraminal narrowing at this level.  07/05/22 - Lumbar x-ray: IMPRESSION: 1. No acute abnormality in lumbar spine. 2. Mild degenerative changes. 3. Aortic atherosclerosis.  PATIENT SURVEYS:  Modified Oswestry 32 / 50 = 64.0 %  FOTO Lumbar = 41, predicted = 48  SCREENING FOR RED FLAGS: Bowel or bladder incontinence: Yes: chronic Spinal tumors: No Cauda equina syndrome: No Compression fracture: No Abdominal aneurysm: No  COGNITION:  Overall cognitive status: Within functional limits for tasks assessed    SENSATION: Peripheral neuropathy  MUSCLE LENGTH: Hamstrings: mild tight R ITB: mod tight B Piriformis: mild/mod tight B Hip flexors: mild tight R Quads: mild tight R Heelcord: NT  POSTURE:  Patient sits with severe posterior left lateral lean.  In standing, she demonstrates increased weight shift to left leg with right knee flexed in stance.  PALPATION: TTP throughout R glutes, piriformis and proximal hamstrings  LUMBAR ROM:   Active  Eval *  Flexion Unable  Extension 50% limited  Right lateral flexion Hand to lateral knee  Left lateral flexion Hand to lateral knee  Right rotation 75% limited  Left rotation 75% limited  (Blank rows = not tested) *08/24/22 - ROM assessment limited by patient's limited balance and fear of letting go from rollator.  Increased pain reported with all attempted movement  LOWER EXTREMITY ROM:    B LE ROM grossly WFL  LOWER EXTREMITY MMT:    MMT Right eval Left eval  Hip flexion 4- 4  Hip  extension 2 2  Hip abduction 3- 3+  Hip adduction 2+ 3-  Hip internal rotation 4- 4-  Hip external rotation 3+ 4-  Knee flexion 4- 4  Knee extension 4- 4  Ankle dorsiflexion 4 4  Ankle plantarflexion    Ankle inversion    Ankle eversion     (Blank rows = not tested)  LUMBAR SPECIAL TESTS:  Straight leg raise test: Positive and FABER test: Positive  FUNCTIONAL TESTS: (09/05/22) 5 times sit to stand: 21.21 sec with hands pushing from knees for a few reps Timed up and go (TUG): 27.59 sec with rollator 10 meter walk test: 25.59 sec with rollator, Gait speed = 1.28 ft/sec Berg Balance Scale: 27/56, < 36 high risk for falls (close to 100%) Dynamic Gait Index: 7/24,  Scores of 19 or less are predictive of falls in older community living adults  GAIT: Distance walked: 60 ft Assistive device utilized: Environmental consultant - 4 wheeled Level of assistance: Modified independence and SBA Gait pattern: decreased stance time- Right, knee flexed in stance- Right, antalgic, and lateral lean- Left Comments:    TODAY'S TREATMENT:   09/26/22 THERAPEUTIC EXERCISE: to improve flexibility, strength and mobility.  Demonstration, verbal and tactile cues throughout for technique. NuStep - L3 x 6 min (LE only after 1 min d/t c/o arms going numb) Hooklying R SKTC x 30" Supine L SKTC x 30" Hooklying R/L figure-4 piriformis stretch with slight overpressure on L only x 30" each Hooklying R/L KTOS piriformis stretch x 30" each Hooklying B LTR 5 x 10" Hooklying R HS stretch with strap 2 x 30" - pt reporting pins & needles on 1st rep, resolved on 2nd rep after performing sciatic nerve glide Supine/hooklying R sciatic nerve glide 2 x 10 Hooklying L HS stretch with strap 2 x 30" - no issues Hooklying posterior pelvic tilt 10 x 5" Hooklying PPT + hip ADD isometric ball squeeze 10 x 5" Hooklying TrA + RTB alt bent-knee fallout 10 x 3"   09/05/22 THERAPEUTIC ACTIVITIES: 5xSTS = 21.21 sec with hands pushing from knees for a  few reps TUG = 27.59 sec with rollator = 25.59 sec with rollator Gait speed = 1.28 ft/sec with rollator Berg = 27/56, < 36 high risk for falls (close to 100%)  DGI = 7/24, Scores of 19 or less are predictive of falls in older community living adults    08/24/22 Initial eval THERAPEUTIC EXERCISE: to improve flexibility, strength and mobility.  Verbal and tactile cues throughout for technique.  Hooklying R SKTC x 30" Hooklying R figure-4 piriformis stretch with slight overpressure x 30" Hooklying R KTOS piriformis stretch x 30" Hooklying B LTR 5 x 10" Hooklying posterior pelvic tilt 10 x 5"   PATIENT EDUCATION:  Education details: HEP review and HEP progression Person educated: Patient Education method: Programmer, multimedia, Demonstration, Verbal cues, and Handouts Education comprehension: verbalized understanding, returned demonstration, verbal cues required, and needs further education  HOME EXERCISE PROGRAM: Access Code: [email or web address] URL: [email or web address] Date: 09/26/2022 Prepared by: Glenetta Hew  Exercises - Hooklying Single Knee to Chest  - 2-3 x daily - 7 x weekly - 3 reps - 30 sec hold - Supine Figure 4 Piriformis Stretch  - 2-3 x daily - 7 x weekly - 3 reps - 30 sec hold - Supine Piriformis Stretch with Foot on Ground  - 2-3 x daily - 7 x weekly - 3 reps - 30 sec hold - Supine Lower Trunk Rotation  - 2-3 x daily - 7 x weekly - 5 reps - 10 sec hold - Supine Posterior Pelvic Tilt  - 2-3 x daily - 7 x weekly - 2 sets - 10 reps - 5 sec hold - Hooklying Hamstring Stretch with Strap  - 2-3 x daily - 7 x weekly - 3 reps - 30 sec hold - Supine Sciatic Nerve Glide  - 2 x daily - 7 x weekly - 2 sets - 10 reps - 3 sec hold - Supine Hip Adduction Isometric with Ball  - 1 x daily - 7 x weekly - 2 sets - 10 reps - 5 sec hold - Hooklying Single Leg Bent Knee Fallouts with Resistance  - 1 x daily - 7 x weekly - 2 sets - 10 reps - 3 sec hold   ASSESSMENT:  CLINICAL  IMPRESSION: Brittany [name] returns to PT after another 3 week absence. She denies pain today and states the HEP has been going well.  Initial HEP reviewed with minor clarifications necessary. Pt noting more of a stretch on the R with most of the stretches and having to modify intensity of stretch to avoid increasing pain.  Added hamstring stretch due to patient complaint of tightness in posterior R thigh however stretch triggering pins-and-needles in her foot which resolved with R sciatic nerve glide.  Progressed core/lumbopelvic strengthening adding these exercises to HEP as well.  Will plan for education on proper posture and body mechanics with mobility and daily activities to reduce low back pain/strain on next visit.  Progress slower than anticipated due to patient only completing 2 treatment visits in over a month span since eval, with only STG #2 currently met.  Brittany [name] will continue to benefit from skilled PT to address ongoing flexibility, range of motion, strength, and balance deficits to improve mobility and activity tolerance with decreased pain interference and decreased risk for falls.  She will likely require recert at end of current certification/POC.  OBJECTIVE IMPAIRMENTS: Abnormal gait, decreased activity tolerance, decreased balance, decreased endurance, decreased knowledge of condition, decreased knowledge of use of DME, decreased mobility, difficulty walking, decreased ROM, decreased strength, decreased safety awareness, increased fascial restrictions, impaired perceived functional ability, increased muscle spasms, impaired flexibility, impaired sensation, improper body mechanics, postural dysfunction, and pain.   ACTIVITY LIMITATIONS: carrying, lifting, bending, sitting, standing, squatting, sleeping, stairs, transfers, bed mobility, bathing, toileting, dressing, locomotion level, and caring for others  PARTICIPATION LIMITATIONS: meal prep, cleaning, laundry, driving, shopping, community  activity, and yard work  PERSONAL FACTORS: Fitness, Past/current experiences, Time since onset of injury/illness/exacerbation, and 3+ comorbidities: CVA - L hemiparesis, peripheral neuropathy, multiple myeloma, HTN, hiatal hernia, GERD, HLD, IBS, anemia, anxiety, R shoulder labral tear  are also affecting patient's functional outcome.   REHAB POTENTIAL: Good  CLINICAL DECISION MAKING: Evolving/moderate complexity  EVALUATION COMPLEXITY: Moderate   GOALS: Goals reviewed with patient? Yes  SHORT TERM GOALS: Target date: 09/14/2022  Patient will be independent with initial HEP to improve outcomes and carryover.  Baseline:  Goal status: IN PROGRESS  09/26/22 - HEP reviewed & updated today  2.  Patient will report centralization of radicular symptoms.  Baseline:  Goal status: MET  09/26/22 - pt reports pain more common in back than in her legs recently  3.  Patient will ambulate with improved posture and symmetrical step-through pattern and improved gait mechanics with 4WW or LRAD. Baseline:  Goal status: IN PROGRESS  09/26/22 - cues necessary for symmetrical step length with heel strike on weight acceptance   LONG TERM GOALS: Target date: 10/05/2022  Patient will be independent with ongoing/advanced HEP for self-management at home.  Baseline:  Goal status: IN PROGRESS  2.  Patient will report 75% improvement in low back pain to improve QOL.  Baseline: 6-7/10 Goal status: IN PROGRESS  3.  Patient to demonstrate ability to achieve and maintain good spinal alignment/posturing and body mechanics needed for daily activities. Baseline:  Goal status: IN PROGRESS  4.  Patient will demonstrate functional pain free lumbar ROM to perform ADLs.   Baseline:  Goal status: IN PROGRESS  5.  Patient will demonstrate improved B LE strength to >/= 4 to 4+/5 for improved stability and ease of mobility . Baseline:  Goal status: IN PROGRESS  6.  Patient will report >/= 48 on lumbar FOTO to  demonstrate improved functional  ability.  Baseline: 41 Goal status: IN PROGRESS   7. Patient will report </= 52% on Modified Oswestry to demonstrate improved functional ability with decreased pain interference. Baseline: 32 / 50 = 64.0 % Goal status: IN PROGRESS  8.  Patient to report ability to perform ADLs, household, and leisure activities without limitation due to LBP or R LE pain, LOM or weakness. Baseline: pt's parents are helping her with most activities Goal status: IN PROGRESS  9.  Patient will improve DGI score to >/= 19/24 to improve gait stability and reduce risk for falls Baseline: 7/24 Goal status: IN PROGRESS  10.  Patient will improve Berg score to >/= 36/56 to improve safety and stability with ADLs in standing and reduce risk for falls Baseline: 27/56 Goal status: IN PROGRESS    PLAN:  PT FREQUENCY: 2x/week  PT DURATION: 6 weeks  PLANNED INTERVENTIONS: Therapeutic exercises, Therapeutic activity, Neuromuscular re-education, Balance training, Gait training, Patient/Family education, Self Care, Joint mobilization, Stair training, DME instructions, Aquatic Therapy, Dry Needling, Spinal manipulation, Spinal mobilization, Cryotherapy, Moist heat, Taping, Traction, Ultrasound, Ionotophoresis 4mg /ml Dexamethasone, Manual therapy, and Re-evaluation  PLAN FOR NEXT SESSION: Have patient schedule additional 2x/wk x 4-6 weeks beyond end of current POC if patient interested in continuing with PT; posture & body mechanics education; progress lumbopelvic flexibility and strengthening - review/update HEP PRN; MT to R glutes/piriformis and proximal HS as indicated   Marry Guan, PT 09/26/2022, 6:59 PM  PHYSICAL THERAPY DISCHARGE SUMMARY  Visits from Start of Care: 3  Current functional level related to goals / functional outcomes: See above   Remaining deficits: See above   Education / Equipment: HEP  Plan: Patient goals were not met. Patient is being discharged due  to health issues she was not able to return to therapy and requested discharge on 10/03/2022.       Jena Gauss, PT  11/01/2022 3:59 PM

## 2022-09-28 ENCOUNTER — Ambulatory Visit: Payer: Medicaid Other

## 2022-10-03 ENCOUNTER — Ambulatory Visit: Payer: Medicaid Other

## 2022-10-05 ENCOUNTER — Encounter: Payer: Medicaid Other | Admitting: Physical Therapy

## 2022-10-09 ENCOUNTER — Ambulatory Visit: Payer: Medicaid Other | Admitting: Family Medicine

## 2022-10-09 DIAGNOSIS — Z1231 Encounter for screening mammogram for malignant neoplasm of breast: Secondary | ICD-10-CM

## 2022-10-09 DIAGNOSIS — R739 Hyperglycemia, unspecified: Secondary | ICD-10-CM

## 2022-10-17 ENCOUNTER — Ambulatory Visit: Payer: Medicaid Other

## 2022-12-01 ENCOUNTER — Telehealth: Payer: Self-pay | Admitting: Pharmacist

## 2022-12-01 NOTE — Telephone Encounter (Signed)
I did call to talk to patient because I was worried that she would have run out of refills for her blood pressure medications. She reports that she has plenty of hydrochlorothiazide and nifedipine because she stopped taking them. She states they made her feel terrible and she could not get out of bed.  I did discuss the importance of blood pressure control especially given that she has had a stroke in past.   Medications previously tried: amlodipine - patient stopped and she was also taking nifedipine; lisinopril - concern initially for associated rash, but appears rash continued even after medication was discontinued.   Possible options for blood pressure:  ARB - valsartan could start 80 to 160mg  depending blood pressure.  Retrial of low dose amlodipine at 2.5mg  daily if SBP is high and gradually increase.   Valsartan also comes in combo with hydrochlorothiazide +/- amlodipine (Exforge) so umight be able to use combo in future if additional blood pressure lowering meds needed. Marland Kitchen

## 2022-12-01 NOTE — Telephone Encounter (Signed)
Patient appearing on report for [employer] [facility] - Hypertension Control report due to last documented ambulatory blood pressure of 192/66 on 07/05/2022. Next appointment with PCP is is not currently scheduled. Patient was suppose to follow up with Hyman Hopes, NP on 10/09/2022 but did not show for that appointment.     Current antihypertensives:  nifedipine ER 30mg  daily - last filled 30 day supply on 08/29/2022 Hydrochlorothiazide 25mg  daily - last filled 30 days supply on 06/11/204   Attempted to outreach patient to discuss hypertension control and medication management.  Also was hoping to make follow up appointment with PCP for patient.  LM on VM with office number [phone] and my direct line [phone] Will also forward message to PCP and scheduler to continue to outreach for follow up appointment.   Henrene Pastor, PharmD Clinical Pharmacist [facility]

## 2022-12-05 ENCOUNTER — Encounter: Payer: Self-pay | Admitting: Family Medicine

## 2022-12-05 ENCOUNTER — Emergency Department (HOSPITAL_COMMUNITY): Payer: Medicaid Other

## 2022-12-05 ENCOUNTER — Other Ambulatory Visit: Payer: Self-pay

## 2022-12-05 ENCOUNTER — Encounter (HOSPITAL_BASED_OUTPATIENT_CLINIC_OR_DEPARTMENT_OTHER): Payer: Self-pay | Admitting: Pediatrics

## 2022-12-05 ENCOUNTER — Inpatient Hospital Stay (HOSPITAL_COMMUNITY)
Admission: EM | Admit: 2022-12-05 | Discharge: 2022-12-09 | DRG: 305 | Disposition: A | Discharge status: Home / Self Care | Payer: Medicaid Other | Attending: Internal Medicine | Admitting: Internal Medicine

## 2022-12-05 ENCOUNTER — Emergency Department (HOSPITAL_BASED_OUTPATIENT_CLINIC_OR_DEPARTMENT_OTHER): Payer: Medicaid Other

## 2022-12-05 ENCOUNTER — Emergency Department (HOSPITAL_BASED_OUTPATIENT_CLINIC_OR_DEPARTMENT_OTHER)
Admission: EM | Admit: 2022-12-05 | Discharge: 2022-12-05 | Discharge status: Against Medical Advice | Payer: Medicaid Other | Attending: Emergency Medicine | Admitting: Emergency Medicine

## 2022-12-05 ENCOUNTER — Ambulatory Visit (INDEPENDENT_AMBULATORY_CARE_PROVIDER_SITE_OTHER): Payer: Medicaid Other | Admitting: Family Medicine

## 2022-12-05 VITALS — BP 186/103 | HR 112 | Ht 61.0 in | Wt 159.0 lb

## 2022-12-05 DIAGNOSIS — R7989 Other specified abnormal findings of blood chemistry: Secondary | ICD-10-CM | POA: Diagnosis not present

## 2022-12-05 DIAGNOSIS — M1990 Unspecified osteoarthritis, unspecified site: Secondary | ICD-10-CM | POA: Diagnosis not present

## 2022-12-05 DIAGNOSIS — R9431 Abnormal electrocardiogram [ECG] [EKG]: Secondary | ICD-10-CM | POA: Diagnosis not present

## 2022-12-05 DIAGNOSIS — Z7982 Long term (current) use of aspirin: Secondary | ICD-10-CM | POA: Insufficient documentation

## 2022-12-05 DIAGNOSIS — E876 Hypokalemia: Secondary | ICD-10-CM | POA: Insufficient documentation

## 2022-12-05 DIAGNOSIS — K219 Gastro-esophageal reflux disease without esophagitis: Secondary | ICD-10-CM | POA: Diagnosis present

## 2022-12-05 DIAGNOSIS — I161 Hypertensive emergency: Secondary | ICD-10-CM | POA: Diagnosis not present

## 2022-12-05 DIAGNOSIS — R0789 Other chest pain: Secondary | ICD-10-CM | POA: Diagnosis not present

## 2022-12-05 DIAGNOSIS — E7800 Pure hypercholesterolemia, unspecified: Secondary | ICD-10-CM | POA: Diagnosis present

## 2022-12-05 DIAGNOSIS — I1A0 Resistant hypertension: Secondary | ICD-10-CM | POA: Diagnosis present

## 2022-12-05 DIAGNOSIS — N179 Acute kidney failure, unspecified: Secondary | ICD-10-CM | POA: Diagnosis present

## 2022-12-05 DIAGNOSIS — K589 Irritable bowel syndrome without diarrhea: Secondary | ICD-10-CM | POA: Diagnosis not present

## 2022-12-05 DIAGNOSIS — Z8673 Personal history of transient ischemic attack (TIA), and cerebral infarction without residual deficits: Secondary | ICD-10-CM | POA: Diagnosis not present

## 2022-12-05 DIAGNOSIS — M62838 Other muscle spasm: Secondary | ICD-10-CM | POA: Diagnosis not present

## 2022-12-05 DIAGNOSIS — R7303 Prediabetes: Secondary | ICD-10-CM | POA: Diagnosis present

## 2022-12-05 DIAGNOSIS — R000 Tachycardia, unspecified: Secondary | ICD-10-CM | POA: Diagnosis not present

## 2022-12-05 DIAGNOSIS — M62831 Muscle spasm of calf: Secondary | ICD-10-CM | POA: Diagnosis present

## 2022-12-05 DIAGNOSIS — I214 Non-ST elevation (NSTEMI) myocardial infarction: Secondary | ICD-10-CM | POA: Diagnosis present

## 2022-12-05 DIAGNOSIS — I10 Essential (primary) hypertension: Secondary | ICD-10-CM | POA: Diagnosis present

## 2022-12-05 DIAGNOSIS — R0602 Shortness of breath: Secondary | ICD-10-CM | POA: Diagnosis not present

## 2022-12-05 DIAGNOSIS — Z8249 Family history of ischemic heart disease and other diseases of the circulatory system: Secondary | ICD-10-CM

## 2022-12-05 DIAGNOSIS — M170 Bilateral primary osteoarthritis of knee: Secondary | ICD-10-CM | POA: Diagnosis present

## 2022-12-05 DIAGNOSIS — Z5329 Procedure and treatment not carried out because of patient's decision for other reasons: Secondary | ICD-10-CM | POA: Insufficient documentation

## 2022-12-05 DIAGNOSIS — Z87891 Personal history of nicotine dependence: Secondary | ICD-10-CM

## 2022-12-05 DIAGNOSIS — Z6830 Body mass index (BMI) 30.0-30.9, adult: Secondary | ICD-10-CM

## 2022-12-05 DIAGNOSIS — Z888 Allergy status to other drugs, medicaments and biological substances status: Secondary | ICD-10-CM

## 2022-12-05 DIAGNOSIS — I517 Cardiomegaly: Secondary | ICD-10-CM | POA: Diagnosis not present

## 2022-12-05 DIAGNOSIS — Z885 Allergy status to narcotic agent status: Secondary | ICD-10-CM

## 2022-12-05 DIAGNOSIS — Z91040 Latex allergy status: Secondary | ICD-10-CM | POA: Insufficient documentation

## 2022-12-05 DIAGNOSIS — Z79899 Other long term (current) drug therapy: Secondary | ICD-10-CM

## 2022-12-05 DIAGNOSIS — I4891 Unspecified atrial fibrillation: Secondary | ICD-10-CM | POA: Diagnosis not present

## 2022-12-05 DIAGNOSIS — R079 Chest pain, unspecified: Principal | ICD-10-CM

## 2022-12-05 DIAGNOSIS — E739 Lactose intolerance, unspecified: Secondary | ICD-10-CM | POA: Diagnosis present

## 2022-12-05 DIAGNOSIS — Z91148 Patient's other noncompliance with medication regimen for other reason: Secondary | ICD-10-CM

## 2022-12-05 DIAGNOSIS — G629 Polyneuropathy, unspecified: Secondary | ICD-10-CM | POA: Diagnosis not present

## 2022-12-05 DIAGNOSIS — Z823 Family history of stroke: Secondary | ICD-10-CM

## 2022-12-05 DIAGNOSIS — E669 Obesity, unspecified: Secondary | ICD-10-CM | POA: Diagnosis present

## 2022-12-05 LAB — BASIC METABOLIC PANEL
Anion gap: 14 (ref 5–15)
BUN: 9 mg/dL (ref 6–20)
CO2: 23 mmol/L (ref 22–32)
Calcium: 9.6 mg/dL (ref 8.9–10.3)
Chloride: 103 mmol/L (ref 98–111)
Creatinine, Ser: 1.04 mg/dL — ABNORMAL HIGH (ref 0.44–1.00)
GFR, Estimated: >60 mL/min (ref >60)
Glucose, Bld: 106 mg/dL — ABNORMAL HIGH (ref 70–99)
Potassium: 3.2 mmol/L — ABNORMAL LOW (ref 3.5–5.1)
Sodium: 140 mmol/L (ref 135–145)

## 2022-12-05 LAB — CBC
HCT: 48.2 % — ABNORMAL HIGH (ref 36.0–46.0)
Hemoglobin: 16.4 g/dL — ABNORMAL HIGH (ref 12.0–15.0)
MCH: 29.2 pg (ref 26.0–34.0)
MCHC: 34 g/dL (ref 30.0–36.0)
MCV: 85.8 fL (ref 80.0–100.0)
Platelets: 198 10*3/uL (ref 150–400)
RBC: 5.62 MIL/uL — ABNORMAL HIGH (ref 3.87–5.11)
RDW: 15.2 % (ref 11.5–15.5)
WBC: 6.1 10*3/uL (ref 4.0–10.5)
nRBC: 0 % (ref 0.0–0.2)

## 2022-12-05 LAB — TROPONIN I (HIGH SENSITIVITY)
Troponin I (High Sensitivity): 41 ng/L — ABNORMAL HIGH (ref <18)
Troponin I (High Sensitivity): 49 ng/L — ABNORMAL HIGH (ref <18)
Troponin I (High Sensitivity): 54 ng/L — ABNORMAL HIGH (ref <18)

## 2022-12-05 LAB — D-DIMER, QUANTITATIVE: D-Dimer, Quant: 0.53 ug{FEU}/mL — ABNORMAL HIGH (ref 0.00–0.50)

## 2022-12-05 LAB — EKG 12-LEAD

## 2022-12-05 MED ORDER — HYDRALAZINE HCL 20 MG/ML IJ SOLN: 10.0000 mg | Freq: Four times a day (QID) | INTRAMUSCULAR | Status: DC | PRN

## 2022-12-05 MED ORDER — HYDROCHLOROTHIAZIDE 12.5 MG PO TABS
12.5000 mg | ORAL_TABLET | Freq: Every day | ORAL | Status: DC
  Administered 2022-12-05: 12.5 mg via ORAL
  Filled 2022-12-05: qty 1

## 2022-12-05 MED ORDER — NIFEDIPINE ER OSMOTIC RELEASE 30 MG PO TB24: 30.0000 mg | ORAL_TABLET | Freq: Every day | ORAL | Status: DC

## 2022-12-05 MED ORDER — FUROSEMIDE 20 MG PO TABS: 20.0000 mg | ORAL_TABLET | Freq: Two times a day (BID) | ORAL | Status: DC

## 2022-12-05 MED ORDER — NIFEDIPINE ER OSMOTIC RELEASE 30 MG PO TB24
30.0000 mg | ORAL_TABLET | Freq: Every day | ORAL | Status: DC
  Filled 2022-12-05: qty 1

## 2022-12-05 MED ORDER — ASPIRIN 325 MG PO TABS: 325.0000 mg | ORAL_TABLET | Freq: Every day | ORAL | Status: DC

## 2022-12-05 MED ORDER — HYDROCHLOROTHIAZIDE 12.5 MG PO CAPS: 12.5000 mg | ORAL_CAPSULE | Freq: Every day | ORAL | Status: DC

## 2022-12-05 MED ORDER — ASPIRIN 325 MG PO TABS
325.0000 mg | ORAL_TABLET | Freq: Every day | ORAL | Status: DC
  Administered 2022-12-05 – 2022-12-07 (×3): 325 mg via ORAL
  Filled 2022-12-05 (×3): qty 1

## 2022-12-05 NOTE — Discharge Instructions (Signed)
As we discussed, you feel going home prior to the recommended admission to the hospital to arrange care for animals and parents is unavoidable and you choose to sign out against medical advice.   If we admitted you from [facility] it would involve a transfer to [facility]. As you intend to return for the necessary medical care,  it is recommended that you present to [facility] when you return.

## 2022-12-05 NOTE — ED Triage Notes (Signed)
Reported was seen at PCP earlier for a routine visit and was told she had an abnormal EKG. Reports when she moves, her heart beat goes fast.

## 2022-12-05 NOTE — ED Triage Notes (Addendum)
Patient reports SOB with chest tightness/palpitations this week , no emesis or diaphoresis . Hypertensive at triage .

## 2022-12-05 NOTE — ED Provider Notes (Signed)
[place] EMERGENCY DEPARTMENT AT [facility] Provider Note   CSN: [id number] Arrival date & time: 12/05/22  1323     History  Chief Complaint  Patient presents with   Abnormal ECG    Brittany [name] is a 57 y.o. female.  Patient sent to ED for evaluation of abnormal EKG by her primary care physician. She was on a routine appointment with PCP and was found to have a changed EKG but she denies chest pain. She reports she stopped taking her blood pressure medications about one month ago. She reports symptoms of a painless heart pounding sensation and dyspnea when she exerts herself and that these symptoms resolve with rest that has been going on for "years" and are unchanged now. No nausea, vomiting. No recent illness, cough, fever. No lower extremity swelling.   The history is provided by the patient. No language interpreter was used.       Home Medications Prior to Admission medications   Medication Sig Start Date End Date Taking? Authorizing Provider  albuterol (VENTOLIN HFA) 108 (90 Base) MCG/ACT inhaler Inhale 2 puffs into the lungs every 4 (four) hours as needed for wheezing or shortness of breath. 02/24/22   Ivonne Andrew, NP  aspirin 325 MG tablet Take 1 tablet (325 mg total) by mouth daily. 12/09/16   Mikhail, Nita Sells, DO  atorvastatin (LIPITOR) 80 MG tablet Take 1 tablet (80 mg total) by mouth daily. 07/06/22 07/06/23  Clayborne Dana, NP  ciclopirox (PENLAC) 8 % solution APPLY TOPICALLY AT BEDTIME. APPLY OVER NAIL AND SURROUNDING SKIN. APPLY DAILY OVER PREVIOUS COAT. AFTER SEVEN (7) DAYS, MAY REMOVE WITH ALCOHOL AND CONTINUE CYCLE. 05/13/21   Vivi Barrack, DPM  cyanocobalamin 2000 MCG tablet Take 1 tablet (2,000 mcg total) by mouth daily. 08/10/22   Clayborne Dana, NP  cyclobenzaprine (FLEXERIL) 10 MG tablet Take 1 tablet (10 mg total) by mouth 2 (two) times daily as needed for muscle spasms. 06/12/22   Jeanelle Malling, PA  diazepam (VALIUM) 5 MG tablet Take one tablet  by mouth with food one hour prior to procedure. May repeat 30 minutes prior if needed. 08/18/22   Juanda Chance, NP  docusate sodium (COLACE) 100 MG capsule Take 200 mg by mouth 2 (two) times daily.    [provider]  Ergocalciferol (VITAMIN D2) 10 MCG (400 UNIT) TABS Take 10 mcg by mouth daily. 02/06/18   Kallie Locks, FNP  furosemide (LASIX) 20 MG tablet Take 1 tablet (20 mg total) by mouth 2 (two) times daily as needed. 02/24/22 02/24/23  Ivonne Andrew, NP  gabapentin (NEURONTIN) 300 MG capsule Take 1 capsule (300 mg total) by mouth 3 (three) times daily. 07/07/22   Clayborne Dana, NP  hydrochlorothiazide (MICROZIDE) 12.5 MG capsule Take 1 capsule (12.5 mg total) by mouth daily. 02/24/22   Ivonne Andrew, NP  hydrOXYzine (VISTARIL) 50 MG capsule TAKE 1 TO 2 CAPSULES (50 TO 100 MG TOTAL) BY MOUTH 3 (THREE) TIMES DAILY AS NEEDED FOR ITCHING. 02/24/22   Ivonne Andrew, NP  linaclotide [facility]) 72 MCG capsule Take 1 capsule (72 mcg total) by mouth daily before breakfast. 07/26/22   Clayborne Dana, NP  methocarbamol (ROBAXIN) 500 MG tablet Take 1 tablet (500 mg total) by mouth 3 (three) times daily. 02/24/22   Ivonne Andrew, NP  Multiple Vitamin (MULTIVITAMIN) capsule Take 4 capsules by mouth 2 (two) times daily. Centrum plus    [provider]  NIFEdipine (  PROCARDIA-XL/NIFEDICAL-XL) 30 MG 24 hr tablet Take 1 tablet (30 mg total) by mouth daily. 02/24/22   Ivonne Andrew, NP  omeprazole (PRILOSEC) 20 MG capsule Take 1 capsule (20 mg total) by mouth 2 (two) times daily before a meal. 02/24/22 02/24/23  Ivonne Andrew, NP  Tavaborole 5 % SOLN Apply 1 application topically daily. 05/13/21   Vivi Barrack, DPM  triamcinolone cream (KENALOG) 0.1 % APPLY 1 APPLICATION TOPICALLY 2 (TWO) TIMES DAILY. 11/09/21   Ivonne Andrew, NP  Vitamin D, Ergocalciferol, (DRISDOL) 1.25 MG (50000 UNIT) CAPS capsule Take 1 capsule (50,000 Units total) by mouth every 7 (seven) days. 07/06/22    Clayborne Dana, NP      Allergies    Albuterol, Amitriptyline, Duloxetine, Naproxen, Other, Darvon [propoxyphene], Hydrocodone, Lactose intolerance (gi), Latex, Oxycodone, Percocet [oxycodone-acetaminophen], and Topamax [topiramate]    Review of Systems   Review of Systems  Physical Exam Updated Vital Signs BP (!) 208/113   Pulse 94   Temp (!) 97.3 F (36.3 C)   Resp 17   Ht 5\' 1"  (1.549 m)   Wt 72.1 kg   SpO2 100%   BMI 30.04 kg/m  Physical Exam Vitals and nursing note reviewed.  Constitutional:      Appearance: Normal appearance. She is not diaphoretic.  HENT:     Head: Normocephalic and atraumatic.  Neck:     Vascular: No carotid bruit.  Cardiovascular:     Rate and Rhythm: Normal rate and regular rhythm.     Heart sounds: No murmur heard. Pulmonary:     Effort: Pulmonary effort is normal.     Breath sounds: No wheezing, rhonchi or rales.  Abdominal:     General: There is no distension.     Palpations: Abdomen is soft.     Tenderness: There is no abdominal tenderness.  Musculoskeletal:        General: Normal range of motion.     Cervical back: Normal range of motion.     Right lower leg: No edema.     Left lower leg: No edema.  Skin:    General: Skin is warm and dry.  Neurological:     Mental Status: She is oriented to person, place, and time.     ED Results / Procedures / Treatments   Labs (all labs ordered are listed, but only abnormal results are displayed) Labs Reviewed  BASIC METABOLIC PANEL - Abnormal; Notable for the following components:      Result Value   Potassium 3.2 (*)    Glucose, Bld 106 (*)    Creatinine, Ser 1.04 (*)    All other components within normal limits  CBC - Abnormal; Notable for the following components:   RBC 5.62 (*)    Hemoglobin 16.4 (*)    HCT 48.2 (*)    All other components within normal limits  TROPONIN I (HIGH SENSITIVITY) - Abnormal; Notable for the following components:   Troponin I (High Sensitivity) 41 (*)     All other components within normal limits  TROPONIN I (HIGH SENSITIVITY) - Abnormal; Notable for the following components:   Troponin I (High Sensitivity) 49 (*)    All other components within normal limits    EKG EKG Interpretation Date/Time:  Tuesday December 05 2022 13:35:52 EDT Ventricular Rate:  92 PR Interval:  144 QRS Duration:  99 QT Interval:  341 QTC Calculation: 422 R Axis:   51  Text Interpretation: Sinus rhythm Right atrial enlargement Abnormal  T, consider ischemia, lateral leads New since previous tracing Confirmed by Vanetta Mulders [phone]) on 12/05/2022 1:59:46 PM  Radiology DG Chest Port 1 View  Result Date: 12/05/2022 CLINICAL DATA:  Atrial fibrillation. EXAM: PORTABLE CHEST 1 VIEW COMPARISON:  10/15/2018 FINDINGS: The heart is upper normal in size. Mediastinal contours are normal. No focal airspace disease, pleural effusion, or pneumothorax. Pulmonary vasculature is normal. No acute osseous findings. IMPRESSION: Borderline cardiomegaly. No acute pulmonary process. Electronically Signed   By: Narda Rutherford M.D.   On: 12/05/2022 15:37    Procedures Procedures    Medications Ordered in ED Medications - No data to display  ED Course/ Medical Decision Making/ A&P Clinical Course as of 12/05/22 1858  Tue Dec 05, 2022  1426 EKG shows new T-wave inversions laterally, new since previous tracing, per Dr. Darlyn Chamber interpretation. The patient reports being on 3 different medications for blood pressure, including Amlodipine that was changed to Nifedipine, she thinks, last year. She reports she started having headaches after starting nifedipine. She stopped taking all medications one month ago because she wanted to "clear my system". She restarted medications 2 weeks ago, leaving off nifedipine and restarting amlodipine. Labs pending. She is currently asymptomatic and comfortable.  [SU]  1530 Mild hypokalemia at 3.2. Labs otherwise essentially unremarkable. Recheck:  she is still comfortable and asymptomatic. BP recheck 190/90 [SU]  1553 Troponin slightly elevated at 41. Will get 2-hour repeat. Patient states she restarted Amlodipine, unknown mg dose. Per staff at [facility] where she gets her medications, Amlodipine 10 mg was last filled 12/23.  [SU]  1815 Repeat trop 49, slightly increased. Patient remains comfortable, however, she will need further testing inpatient. Will consult cardiology. [SU]  1822 Discussed admission with the patient who feels she may have to leave and arrange care for her animals and her disabled parents. Discussed AMA discharge and importance of admission for her own health. She would like time to consider.  [SU]  1845 Patient reports she has to go home to arrange care for animals and parents and fully intends to return for inpatient care as recommended. Discussed the need for AMA discharge due to the high risk involved in her condition. She acknowledges understanding. Discussed that she should go directly to [facility] on returning for further care as this is where she would be transported for admission.  [SU]  [id number] Charge nurse at [facility] given patient information in the event she presents for the recommended admission.  [SU]    Clinical Course User Index [SU] Elpidio Anis, PA-C                                 Medical Decision Making Amount and/or Complexity of Data Reviewed Labs: ordered. Radiology: ordered.           Final Clinical Impression(s) / ED Diagnoses Final diagnoses:  NSTEMI (non-ST elevated myocardial infarction) [facility])    Rx / DC Orders ED Discharge Orders     None         Elpidio Anis, PA-C 12/05/22 1858    Vanetta Mulders, MD 12/07/22 1238

## 2022-12-05 NOTE — ED Provider Notes (Signed)
[place] EMERGENCY DEPARTMENT AT [facility] Provider Note   CSN: [id number] Arrival date & time: 12/05/22  2044     History  Chief Complaint  Patient presents with   SOB / Chest Tightness    Brittany [name] is a 57 y.o. female.  57 yo F with a chief complaints of difficulty breathing on exertion.  She thinks has been going on for quite some time.  It has gotten a little bit worse over the past week or 2.  She saw her family doctor for the first time in a while and was found to have what they felt were EKG changes and she was sent to the emergency department for evaluation.  Workup there was concerning for slowly escalating troponins and plan were made to admit her to the hospital unfortunately she was unable to stay and she left.  She then returned to this facility to be admitted.        Home Medications Prior to Admission medications   Medication Sig Start Date End Date Taking? Authorizing Provider  albuterol (VENTOLIN HFA) 108 (90 Base) MCG/ACT inhaler Inhale 2 puffs into the lungs every 4 (four) hours as needed for wheezing or shortness of breath. 02/24/22   Ivonne Andrew, NP  aspirin 325 MG tablet Take 1 tablet (325 mg total) by mouth daily. 12/09/16   Mikhail, Nita Sells, DO  atorvastatin (LIPITOR) 80 MG tablet Take 1 tablet (80 mg total) by mouth daily. 07/06/22 07/06/23  Clayborne Dana, NP  ciclopirox (PENLAC) 8 % solution APPLY TOPICALLY AT BEDTIME. APPLY OVER NAIL AND SURROUNDING SKIN. APPLY DAILY OVER PREVIOUS COAT. AFTER SEVEN (7) DAYS, MAY REMOVE WITH ALCOHOL AND CONTINUE CYCLE. 05/13/21   Vivi Barrack, DPM  cyanocobalamin 2000 MCG tablet Take 1 tablet (2,000 mcg total) by mouth daily. 08/10/22   Clayborne Dana, NP  cyclobenzaprine (FLEXERIL) 10 MG tablet Take 1 tablet (10 mg total) by mouth 2 (two) times daily as needed for muscle spasms. 06/12/22   Jeanelle Malling, PA  diazepam (VALIUM) 5 MG tablet Take one tablet by mouth with food one hour prior to procedure. May  repeat 30 minutes prior if needed. 08/18/22   Juanda Chance, NP  docusate sodium (COLACE) 100 MG capsule Take 200 mg by mouth 2 (two) times daily.    [provider]  Ergocalciferol (VITAMIN D2) 10 MCG (400 UNIT) TABS Take 10 mcg by mouth daily. 02/06/18   Kallie Locks, FNP  furosemide (LASIX) 20 MG tablet Take 1 tablet (20 mg total) by mouth 2 (two) times daily as needed. 02/24/22 02/24/23  Ivonne Andrew, NP  gabapentin (NEURONTIN) 300 MG capsule Take 1 capsule (300 mg total) by mouth 3 (three) times daily. 07/07/22   Clayborne Dana, NP  hydrochlorothiazide (MICROZIDE) 12.5 MG capsule Take 1 capsule (12.5 mg total) by mouth daily. 02/24/22   Ivonne Andrew, NP  hydrOXYzine (VISTARIL) 50 MG capsule TAKE 1 TO 2 CAPSULES (50 TO 100 MG TOTAL) BY MOUTH 3 (THREE) TIMES DAILY AS NEEDED FOR ITCHING. 02/24/22   Ivonne Andrew, NP  linaclotide [facility]) 72 MCG capsule Take 1 capsule (72 mcg total) by mouth daily before breakfast. 07/26/22   Clayborne Dana, NP  methocarbamol (ROBAXIN) 500 MG tablet Take 1 tablet (500 mg total) by mouth 3 (three) times daily. 02/24/22   Ivonne Andrew, NP  Multiple Vitamin (MULTIVITAMIN) capsule Take 4 capsules by mouth 2 (two) times daily. Centrum plus  [provider]  NIFEdipine (PROCARDIA-XL/NIFEDICAL-XL) 30 MG 24 hr tablet Take 1 tablet (30 mg total) by mouth daily. 02/24/22   Ivonne Andrew, NP  omeprazole (PRILOSEC) 20 MG capsule Take 1 capsule (20 mg total) by mouth 2 (two) times daily before a meal. 02/24/22 02/24/23  Ivonne Andrew, NP  Tavaborole 5 % SOLN Apply 1 application topically daily. 05/13/21   Vivi Barrack, DPM  triamcinolone cream (KENALOG) 0.1 % APPLY 1 APPLICATION TOPICALLY 2 (TWO) TIMES DAILY. 11/09/21   Ivonne Andrew, NP  Vitamin D, Ergocalciferol, (DRISDOL) 1.25 MG (50000 UNIT) CAPS capsule Take 1 capsule (50,000 Units total) by mouth every 7 (seven) days. 07/06/22   Clayborne Dana, NP      Allergies    Albuterol,  Amitriptyline, Duloxetine, Naproxen, Other, Darvon [propoxyphene], Hydrocodone, Lactose intolerance (gi), Latex, Oxycodone, Percocet [oxycodone-acetaminophen], and Topamax [topiramate]    Review of Systems   Review of Systems  Physical Exam Updated Vital Signs BP (!) 176/102   Pulse (!) 113   Temp 98.1 F (36.7 C) (Oral)   Resp 18   SpO2 97%  Physical Exam Vitals and nursing note reviewed.  Constitutional:      General: She is not in acute distress.    Appearance: She is well-developed. She is not diaphoretic.  HENT:     Head: Normocephalic and atraumatic.  Eyes:     Pupils: Pupils are equal, round, and reactive to light.  Cardiovascular:     Rate and Rhythm: Normal rate and regular rhythm.     Heart sounds: No murmur heard.    No friction rub. No gallop.  Pulmonary:     Effort: Pulmonary effort is normal.     Breath sounds: No wheezing or rales.  Abdominal:     General: There is no distension.     Palpations: Abdomen is soft.     Tenderness: There is no abdominal tenderness.  Musculoskeletal:        General: No tenderness.     Cervical back: Normal range of motion and neck supple.  Skin:    General: Skin is warm and dry.  Neurological:     Mental Status: She is alert and oriented to person, place, and time.  Psychiatric:        Behavior: Behavior normal.     ED Results / Procedures / Treatments   Labs (all labs ordered are listed, but only abnormal results are displayed) Labs Reviewed  D-DIMER, QUANTITATIVE - Abnormal; Notable for the following components:      Result Value   D-Dimer, Quant 0.53 (*)    All other components within normal limits  TROPONIN I (HIGH SENSITIVITY) - Abnormal; Notable for the following components:   Troponin I (High Sensitivity) 54 (*)    All other components within normal limits  TROPONIN I (HIGH SENSITIVITY)    EKG EKG Interpretation Date/Time:  Tuesday December 05 2022 21:04:39 EDT Ventricular Rate:  117 PR  Interval:  156 QRS Duration:  74 QT Interval:  332 QTC Calculation: 463 R Axis:   38  Text Interpretation: Sinus tachycardia Right atrial enlargement Nonspecific ST and T wave abnormality Abnormal ECG Baseline wander TECHNICALLY DIFFICULT flipped t waves laterally have resolved Otherwise no significant change Confirmed by Melene Plan [phone]) on 12/05/2022 9:24:26 PM  Radiology DG Chest 2 View  Result Date: 12/05/2022 CLINICAL DATA:  Shortness of breath EXAM: CHEST - 2 VIEW COMPARISON:  12/05/2022 FINDINGS: Lungs are clear.  No pleural effusion or  pneumothorax. The heart is normal in size. Visualized osseous structures are within normal limits. IMPRESSION: Normal chest radiographs. Electronically Signed   By: Charline Bills M.D.   On: 12/05/2022 21:39   DG Chest Port 1 View  Result Date: 12/05/2022 CLINICAL DATA:  Atrial fibrillation. EXAM: PORTABLE CHEST 1 VIEW COMPARISON:  10/15/2018 FINDINGS: The heart is upper normal in size. Mediastinal contours are normal. No focal airspace disease, pleural effusion, or pneumothorax. Pulmonary vasculature is normal. No acute osseous findings. IMPRESSION: Borderline cardiomegaly. No acute pulmonary process. Electronically Signed   By: Narda Rutherford M.D.   On: 12/05/2022 15:37    Procedures .Critical Care  Performed by: Melene Plan, DO Authorized by: Melene Plan, DO   Critical care provider statement:    Critical care time (minutes):  35   Critical care time was exclusive of:  Separately billable procedures and treating other patients   Critical care was time spent personally by me on the following activities:  Development of treatment plan with patient or surrogate, discussions with consultants, evaluation of patient's response to treatment, examination of patient, ordering and review of laboratory studies, ordering and review of radiographic studies, ordering and performing treatments and interventions, pulse oximetry, re-evaluation of patient's  condition and review of old charts   Care discussed with: admitting provider       Medications Ordered in ED Medications  hydrALAZINE (APRESOLINE) injection 10 mg (has no administration in time range)  aspirin tablet 325 mg (325 mg Oral Given 12/05/22 2302)  hydrochlorothiazide (HYDRODIURIL) tablet 12.5 mg (12.5 mg Oral Given 12/05/22 2302)  NIFEdipine (PROCARDIA-XL/NIFEDICAL-XL) 24 hr tablet 30 mg (30 mg Oral Not Given 12/05/22 2259)    ED Course/ Medical Decision Making/ A&P                                 Medical Decision Making Amount and/or Complexity of Data Reviewed Labs: ordered. Radiology: ordered.  Risk Decision regarding hospitalization.   57 yo F with a chief complaints of difficulty breathing.  This has been going on for some time now but worsening over the past week or 2.  She went to see her family doctor today and was told to come to the emergency department since then has had slowly escalating troponins.  Unfortunately had to leave to take care of some business prior to being admitted to the hospital.  Upon return here she is a bit tachycardic.  Her D-dimer is age-adjusted negative.  She has been hypertensive as well.  Troponin is slightly higher than it was checked earlier today.  Will discuss with medicine for admission.   The patients results and plan were reviewed and discussed.   Any x-rays performed were independently reviewed by myself.   Differential diagnosis were considered with the presenting HPI.  Medications  hydrALAZINE (APRESOLINE) injection 10 mg (has no administration in time range)  aspirin tablet 325 mg (325 mg Oral Given 12/05/22 2302)  hydrochlorothiazide (HYDRODIURIL) tablet 12.5 mg (12.5 mg Oral Given 12/05/22 2302)  NIFEdipine (PROCARDIA-XL/NIFEDICAL-XL) 24 hr tablet 30 mg (30 mg Oral Not Given 12/05/22 2259)    Vitals:   12/05/22 2130 12/05/22 2145 12/05/22 2210 12/05/22 2315  BP: (!) 212/101 (!) 199/117 (!) 184/99 (!) 176/102  Pulse:    (!) 113   Resp: (!) 21 17 (!) 25 18  Temp:   98.1 F (36.7 C)   TempSrc:   Oral   SpO2:  97%     Final diagnoses:  Chest pain with moderate risk for cardiac etiology    Admission/ observation were discussed with the admitting physician, patient and/or family and they are comfortable with the plan.         Final Clinical Impression(s) / ED Diagnoses Final diagnoses:  Chest pain with moderate risk for cardiac etiology    Rx / DC Orders ED Discharge Orders     None         Melene Plan, DO 12/05/22 2320

## 2022-12-05 NOTE — ED Notes (Signed)
D/c paperwork reviewed with pt, including follow up care.  All questions and/or concerns addressed at time of d/c.  No further needs expressed. . Pt verbalized understanding, Ambulatory without assistancewith personal walker to ED exit, NAD.

## 2022-12-05 NOTE — H&P (Incomplete)
History and Physical    Brittany [name] FAO:[id number] DOB: 05-31-1965 DOA: 12/05/2022  PCP: Clayborne Dana, NP   Patient coming from: {Blank single:19197::"Home","Clinic","SNF","ALF","Group Home","Hospice","***"}   Chief Complaint:  Chief Complaint  Patient presents with   SOB / Chest Tightness    HPI:  Brittany [name] is a 57 y.o. female with medical history significant of chronic anemia, chronic osteoarthritis, essential hypertension, GERD, hiatal hernia, IBS, multiple myeloma, peripheral neuropathy, CVA, and prediabetic to emergency department referred by primary care provider for palpitation, chest discomfort or shortness of breath with activity.  Patient reported she is inconsistently taking hypertensive medication.   ED Course:  At presentation to ED patient 100 115, respiratory rate 18, elevated blood pressure 270/114 O2 sat 92% room air BMP showed sodium 140, low potassium 3.2, bicarb 23, chloride 103, blood glucose 106, BUN 9, creatinine 1.04, calcium 9.6 GFR 60.  CBC grossly unremarkable except slight elevated hemoglobin 16.4.  Elevated troponin 41 and 54 trending up.  Elevated D-dimer.  Chest x-ray unremarkable.  It showed borderline cardiomyopathy no acute pulmonary process.   Review of Systems:  ROS  Past Medical History:  Diagnosis Date   Anemia    Arthritis    trigger finger in left hand   Complication of anesthesia    per pt, hard to wake up!   Elevated cholesterol    Female bladder prolapse    per urologist, does not have prolaspe   GERD (gastroesophageal reflux disease)    Heart murmur    pt unsure.   Hiatal hernia    Hypertension    IBS (irritable bowel syndrome)    Multiple myeloma (HCC) 2005   had partial chemo   Peripheral neuropathy    SOB (shortness of breath) on exertion    uses an inhaler   Stroke [facility]) 2017   paralysis left arm/uses a walker    Past Surgical History:  Procedure Laterality Date   BONE BIOPSY  2005   in her back    COLONOSCOPY     over 10 years x3   ESOPHAGOGASTRODUODENOSCOPY     incomplete-over 10 years ago    TEE WITHOUT CARDIOVERSION N/A 12/08/2016   Procedure: TRANSESOPHAGEAL ECHOCARDIOGRAM (TEE);  Surgeon: Quintella Reichert, MD;  Location: [facility] ENDOSCOPY;  Service: Cardiovascular;  Laterality: N/A;   TUBAL LIGATION     UPPER GASTROINTESTINAL ENDOSCOPY     WISDOM TOOTH EXTRACTION       reports that she quit smoking about 7 years ago. Her smoking use included cigarettes. She has never used smokeless tobacco. She reports that she does not currently use alcohol. She reports that she does not currently use drugs after having used the following drugs: Marijuana.  Allergies  Allergen Reactions   Albuterol Swelling and Palpitations   Amitriptyline Other (See Comments)    Patient reported that it made her throat feel like its locking up and it also caused issues with her going to the bathroom   Duloxetine Other (See Comments)    Patient reported that it made her throat lock up and it caused her to have issue with going to the bathroom   Naproxen     Vomiting, sweating, abd spasms   Other     States can't take pain meds that end in "cet" or meds that end in "ine" Darvocet/severe vomiting   Darvon [Propoxyphene] Nausea And Vomiting   Hydrocodone Nausea And Vomiting   Lactose Intolerance (Gi)     Bloating, gas, abd pain   Latex  Itching and Rash   Oxycodone Nausea And Vomiting   Percocet [Oxycodone-Acetaminophen] Nausea And Vomiting   Topamax [Topiramate]     Memory made her emotional     Family History  Problem Relation Age of Onset   Hypertension Mother    Sarcoidosis Mother        currently in remission    Diverticulitis Mother    Irritable bowel syndrome Mother    Liver cancer Mother    Hypertension Father    Stomach cancer Father    Congestive Heart Failure Father    Stroke Maternal Uncle    Scoliosis Brother    Colon cancer Neg Hx    Esophageal cancer Neg Hx    Colon polyps Neg  Hx    Rectal cancer Neg Hx     Prior to Admission medications   Medication Sig Start Date End Date Taking? Authorizing Provider  albuterol (VENTOLIN HFA) 108 (90 Base) MCG/ACT inhaler Inhale 2 puffs into the lungs every 4 (four) hours as needed for wheezing or shortness of breath. 02/24/22   Ivonne Andrew, NP  aspirin 325 MG tablet Take 1 tablet (325 mg total) by mouth daily. 12/09/16   Mikhail, Nita Sells, DO  atorvastatin (LIPITOR) 80 MG tablet Take 1 tablet (80 mg total) by mouth daily. 07/06/22 07/06/23  Clayborne Dana, NP  ciclopirox (PENLAC) 8 % solution APPLY TOPICALLY AT BEDTIME. APPLY OVER NAIL AND SURROUNDING SKIN. APPLY DAILY OVER PREVIOUS COAT. AFTER SEVEN (7) DAYS, MAY REMOVE WITH ALCOHOL AND CONTINUE CYCLE. 05/13/21   Vivi Barrack, DPM  cyanocobalamin 2000 MCG tablet Take 1 tablet (2,000 mcg total) by mouth daily. 08/10/22   Clayborne Dana, NP  cyclobenzaprine (FLEXERIL) 10 MG tablet Take 1 tablet (10 mg total) by mouth 2 (two) times daily as needed for muscle spasms. 06/12/22   Jeanelle Malling, PA  diazepam (VALIUM) 5 MG tablet Take one tablet by mouth with food one hour prior to procedure. May repeat 30 minutes prior if needed. 08/18/22   Juanda Chance, NP  docusate sodium (COLACE) 100 MG capsule Take 200 mg by mouth 2 (two) times daily.    [provider]  Ergocalciferol (VITAMIN D2) 10 MCG (400 UNIT) TABS Take 10 mcg by mouth daily. 02/06/18   Kallie Locks, FNP  furosemide (LASIX) 20 MG tablet Take 1 tablet (20 mg total) by mouth 2 (two) times daily as needed. 02/24/22 02/24/23  Ivonne Andrew, NP  gabapentin (NEURONTIN) 300 MG capsule Take 1 capsule (300 mg total) by mouth 3 (three) times daily. 07/07/22   Clayborne Dana, NP  hydrochlorothiazide (MICROZIDE) 12.5 MG capsule Take 1 capsule (12.5 mg total) by mouth daily. 02/24/22   Ivonne Andrew, NP  hydrOXYzine (VISTARIL) 50 MG capsule TAKE 1 TO 2 CAPSULES (50 TO 100 MG TOTAL) BY MOUTH 3 (THREE) TIMES DAILY AS NEEDED FOR  ITCHING. 02/24/22   Ivonne Andrew, NP  linaclotide [facility]) 72 MCG capsule Take 1 capsule (72 mcg total) by mouth daily before breakfast. 07/26/22   Clayborne Dana, NP  methocarbamol (ROBAXIN) 500 MG tablet Take 1 tablet (500 mg total) by mouth 3 (three) times daily. 02/24/22   Ivonne Andrew, NP  Multiple Vitamin (MULTIVITAMIN) capsule Take 4 capsules by mouth 2 (two) times daily. Centrum plus    [provider]  NIFEdipine (PROCARDIA-XL/NIFEDICAL-XL) 30 MG 24 hr tablet Take 1 tablet (30 mg total) by mouth daily. 02/24/22   Ivonne Andrew, NP  omeprazole (  PRILOSEC) 20 MG capsule Take 1 capsule (20 mg total) by mouth 2 (two) times daily before a meal. 02/24/22 02/24/23  Ivonne Andrew, NP  Tavaborole 5 % SOLN Apply 1 application topically daily. 05/13/21   Vivi Barrack, DPM  triamcinolone cream (KENALOG) 0.1 % APPLY 1 APPLICATION TOPICALLY 2 (TWO) TIMES DAILY. 11/09/21   Ivonne Andrew, NP  Vitamin D, Ergocalciferol, (DRISDOL) 1.25 MG (50000 UNIT) CAPS capsule Take 1 capsule (50,000 Units total) by mouth every 7 (seven) days. 07/06/22   Clayborne Dana, NP     Physical Exam: Vitals:   12/05/22 2101 12/05/22 2130 12/05/22 2145 12/05/22 2210  BP: (!) 217/114 (!) 212/101 (!) 199/117 (!) 184/99  Pulse: (!) 115   (!) 113  Resp: 18 (!) 21 17 (!) 25  Temp: 98.9 F (37.2 C)   98.1 F (36.7 C)  TempSrc:    Oral  SpO2: 99%   97%    Physical Exam   Labs on Admission: I have personally reviewed following labs and imaging studies  CBC: Recent Labs  Lab 12/05/22 1338  WBC 6.1  HGB 16.4*  HCT 48.2*  MCV 85.8  PLT 198   Basic Metabolic Panel: Recent Labs  Lab 12/05/22 1338  NA 140  K 3.2*  CL 103  CO2 23  GLUCOSE 106*  BUN 9  CREATININE 1.04*  CALCIUM 9.6   GFR: Estimated Creatinine Clearance: 54.2 mL/min (A) (by C-G formula based on SCr of 1.04 mg/dL (H)). Liver Function Tests: No results for input(s): "AST", "ALT", "ALKPHOS", "BILITOT", "PROT", "ALBUMIN" in  the last 168 hours. No results for input(s): "LIPASE", "AMYLASE" in the last 168 hours. No results for input(s): "AMMONIA" in the last 168 hours. Coagulation Profile: No results for input(s): "INR", "PROTIME" in the last 168 hours. Cardiac Enzymes: Recent Labs  Lab 12/05/22 1515 12/05/22 1724 12/05/22 2124  TROPONINIHS 41* 49* 54*   BNP (last 3 results) No results for input(s): "BNP" in the last 8760 hours. HbA1C: No results for input(s): "HGBA1C" in the last 72 hours. CBG: No results for input(s): "GLUCAP" in the last 168 hours. Lipid Profile: No results for input(s): "CHOL", "HDL", "LDLCALC", "TRIG", "CHOLHDL", "LDLDIRECT" in the last 72 hours. Thyroid Function Tests: No results for input(s): "TSH", "T4TOTAL", "FREET4", "T3FREE", "THYROIDAB" in the last 72 hours. Anemia Panel: No results for input(s): "VITAMINB12", "FOLATE", "FERRITIN", "TIBC", "IRON", "RETICCTPCT" in the last 72 hours. Urine analysis:    Component Value Date/Time   COLORURINE YELLOW 10/15/2018 1839   APPEARANCEUR CLEAR 10/15/2018 1839   LABSPEC 1.025 10/15/2018 1839   PHURINE 6.0 10/15/2018 1839   GLUCOSEU NEGATIVE 10/15/2018 1839   HGBUR NEGATIVE 10/15/2018 1839   BILIRUBINUR neg 10/06/2020 1405   KETONESUR NEGATIVE 10/15/2018 1839   PROTEINUR Negative 10/06/2020 1405   PROTEINUR 30 (A) 10/15/2018 1839   UROBILINOGEN 0.2 10/06/2020 1405   UROBILINOGEN 0.2 05/21/2017 1111   NITRITE neg 10/06/2020 1405   NITRITE NEGATIVE 10/15/2018 1839   LEUKOCYTESUR Negative 10/06/2020 1405   LEUKOCYTESUR NEGATIVE 10/15/2018 1839    Radiological Exams on Admission: I have personally reviewed images DG Chest 2 View  Result Date: 12/05/2022 CLINICAL DATA:  Shortness of breath EXAM: CHEST - 2 VIEW COMPARISON:  12/05/2022 FINDINGS: Lungs are clear.  No pleural effusion or pneumothorax. The heart is normal in size. Visualized osseous structures are within normal limits. IMPRESSION: Normal chest radiographs.  Electronically Signed   By: Charline Bills M.D.   On: 12/05/2022 21:39   DG  Chest Port 1 View  Result Date: 12/05/2022 CLINICAL DATA:  Atrial fibrillation. EXAM: PORTABLE CHEST 1 VIEW COMPARISON:  10/15/2018 FINDINGS: The heart is upper normal in size. Mediastinal contours are normal. No focal airspace disease, pleural effusion, or pneumothorax. Pulmonary vasculature is normal. No acute osseous findings. IMPRESSION: Borderline cardiomegaly. No acute pulmonary process. Electronically Signed   By: Narda Rutherford M.D.   On: 12/05/2022 15:37    EKG: My personal interpretation of EKG shows: ***    Assessment/Plan: Active Problems:   * No active hospital problems. *    Assessment and Plan: No notes have been filed under this hospital service. Service: Hospitalist      DVT prophylaxis:  {Blank single:19197::"Lovenox","SQ Heparin","IV heparin gtts","Xarelto","Eliquis","Coumadin","SCDs","***"} Code Status:  {Blank single:19197::"Full Code","DNR with Intubation","DNR/DNI(Do NOT Intubate)","Comfort Care","***"} Diet:  Family Communication:  ***  Disposition Plan:  ***  Consults:  ***  Admission status:   {Blank single:19197::"Observation","Inpatient"}, {Blank single:19197::"Med-Surg","Telemetry bed","Step Down Unit"}  Severity of Illness: {Observation/Inpatient:21159}    Tereasa Coop, MD [facility]  How to contact the [facility] Attending or Consulting provider 7A - 7P or covering provider during after hours 7P -7A, for this patient.  Check the care team in [facility] and look for a) attending/consulting [facility] provider listed and b) the [facility] team listed Log into [email or web address] and use [place]'s universal password to access. If you do not have the password, please contact the hospital operator. Locate the [facility] provider you are looking for under [facility] and page to a number that you can be directly reached. If you still have difficulty reaching the provider, please page the  [facility] (Director on Call) for the Hospitalists listed on amion for assistance.  12/05/2022, 10:43 PM

## 2022-12-05 NOTE — Progress Notes (Signed)
Acute Office Visit  Subjective:     Patient ID: Brittany [name], female    DOB: May 16, 1965, 57 y.o.   MRN: [id number]  Chief Complaint  Patient presents with   Hypertension     Patient is in today for elevated blood pressure.   Discussed the use of AI scribe software for clinical note transcription with the patient, who gave verbal consent to proceed.  History of Present Illness   The patient, with a history of hypertension, presents with complaints of elevated blood pressure and associated symptoms. They report experiencing a rapid heart rate and a sensation of near syncope upon exertion, such as walking from the car to the elevator. They deny severe chest pain but describe a throbbing sensation in the chest, visible through their shirt, and shortness of breath. They deny peripheral edema but mention hip pain and spinal issues.  The patient has been on a regimen of amlodipine, hydrochlorothiazide, Lasix, and vitamin D. They report discontinuing nifedipine due to severe headaches, which they describe as so intense that they feel the need to go to the emergency room. They have been taking their old amlodipine 10 mg, Lasix 20 mg, and hydrochlorothiazide 12.5 mg. However, they admit to not taking these medications daily and had stopped all medications for a period to 'clear their system,' before restarting them recently.  At home, they have been monitoring their blood pressure, which has been consistently high, with readings such as 180/118 and 157/112. They have been trying to manage their hypertension with dietary modifications, including increased intake of garlic, beets, and oatmeal. They also report leg pain, which they believe is connected to their hypertension. They had a stroke a few years ago.             ROS All review of systems negative except what is listed in the HPI      Objective:    BP (!) 186/103   Pulse (!) 112   Ht 5\' 1"  (1.549 m)   Wt 159 lb (72.1 kg)    SpO2 100%   BMI 30.04 kg/m    Physical Exam Vitals reviewed.  Constitutional:      Appearance: Normal appearance.  Cardiovascular:     Rate and Rhythm: Normal rate and regular rhythm.     Heart sounds: Normal heart sounds.  Pulmonary:     Effort: Pulmonary effort is normal.     Breath sounds: Normal breath sounds.  Skin:    General: Skin is warm and dry.  Neurological:     Mental Status: She is alert and oriented to person, place, and time.  Psychiatric:        Mood and Affect: Mood normal.        Behavior: Behavior normal.        Thought Content: Thought content normal.        Judgment: Judgment normal.     Results for orders placed or performed in visit on 12/05/22  EKG 12-Lead  Result Value Ref Range   EKG 12 lead          Assessment & Plan:   Problem List Items Addressed This Visit       Active Problems   Hypertension - Primary   Relevant Orders   EKG 12-Lead (Completed)   Tachycardia   Other Visit Diagnoses     Abnormal EKG           Uncontrolled HTN with reported symptoms of palpitations, chest discomfort and shortness of breath  with activity. Patient has been inconsistently taking antihypertensive medications (amlodipine 10mg , hydrochlorothiazide 12.5mg , and Lasix 20mg ) -EKG today abnormal. ST 114, RAE, voltage criteria for LVH, T wave abnormalities. -Patient agreeable to go to [facility] ED for her symptoms, vitals, and EKG changes. Escorted in wheelchair by CMA. Telephone report to ED charge nurse.        No orders of the defined types were placed in this encounter.   Return for ED follow-up after discharge.  Clayborne Dana, NP

## 2022-12-06 ENCOUNTER — Encounter (HOSPITAL_COMMUNITY): Payer: Self-pay | Admitting: Internal Medicine

## 2022-12-06 DIAGNOSIS — I517 Cardiomegaly: Secondary | ICD-10-CM | POA: Diagnosis not present

## 2022-12-06 DIAGNOSIS — Z87891 Personal history of nicotine dependence: Secondary | ICD-10-CM | POA: Diagnosis not present

## 2022-12-06 DIAGNOSIS — E7800 Pure hypercholesterolemia, unspecified: Secondary | ICD-10-CM | POA: Diagnosis not present

## 2022-12-06 DIAGNOSIS — Z91148 Patient's other noncompliance with medication regimen for other reason: Secondary | ICD-10-CM | POA: Diagnosis not present

## 2022-12-06 DIAGNOSIS — Z823 Family history of stroke: Secondary | ICD-10-CM | POA: Diagnosis not present

## 2022-12-06 DIAGNOSIS — Z79899 Other long term (current) drug therapy: Secondary | ICD-10-CM | POA: Diagnosis not present

## 2022-12-06 DIAGNOSIS — E876 Hypokalemia: Secondary | ICD-10-CM | POA: Diagnosis not present

## 2022-12-06 DIAGNOSIS — N179 Acute kidney failure, unspecified: Secondary | ICD-10-CM | POA: Insufficient documentation

## 2022-12-06 DIAGNOSIS — K219 Gastro-esophageal reflux disease without esophagitis: Secondary | ICD-10-CM | POA: Diagnosis not present

## 2022-12-06 DIAGNOSIS — M62838 Other muscle spasm: Secondary | ICD-10-CM | POA: Insufficient documentation

## 2022-12-06 DIAGNOSIS — R0602 Shortness of breath: Secondary | ICD-10-CM | POA: Diagnosis not present

## 2022-12-06 DIAGNOSIS — Z8673 Personal history of transient ischemic attack (TIA), and cerebral infarction without residual deficits: Secondary | ICD-10-CM

## 2022-12-06 DIAGNOSIS — M62831 Muscle spasm of calf: Secondary | ICD-10-CM | POA: Diagnosis not present

## 2022-12-06 DIAGNOSIS — K2100 Gastro-esophageal reflux disease with esophagitis, without bleeding: Secondary | ICD-10-CM | POA: Diagnosis not present

## 2022-12-06 DIAGNOSIS — I161 Hypertensive emergency: Secondary | ICD-10-CM | POA: Diagnosis not present

## 2022-12-06 DIAGNOSIS — R7303 Prediabetes: Secondary | ICD-10-CM | POA: Diagnosis not present

## 2022-12-06 DIAGNOSIS — E669 Obesity, unspecified: Secondary | ICD-10-CM | POA: Diagnosis not present

## 2022-12-06 DIAGNOSIS — Z6830 Body mass index (BMI) 30.0-30.9, adult: Secondary | ICD-10-CM | POA: Diagnosis not present

## 2022-12-06 DIAGNOSIS — E739 Lactose intolerance, unspecified: Secondary | ICD-10-CM | POA: Diagnosis not present

## 2022-12-06 DIAGNOSIS — R0789 Other chest pain: Secondary | ICD-10-CM | POA: Diagnosis not present

## 2022-12-06 DIAGNOSIS — Z7982 Long term (current) use of aspirin: Secondary | ICD-10-CM | POA: Diagnosis not present

## 2022-12-06 DIAGNOSIS — I10 Essential (primary) hypertension: Secondary | ICD-10-CM | POA: Diagnosis not present

## 2022-12-06 DIAGNOSIS — M1990 Unspecified osteoarthritis, unspecified site: Secondary | ICD-10-CM

## 2022-12-06 DIAGNOSIS — Z91040 Latex allergy status: Secondary | ICD-10-CM | POA: Diagnosis not present

## 2022-12-06 DIAGNOSIS — Z885 Allergy status to narcotic agent status: Secondary | ICD-10-CM | POA: Diagnosis not present

## 2022-12-06 DIAGNOSIS — Z888 Allergy status to other drugs, medicaments and biological substances status: Secondary | ICD-10-CM | POA: Diagnosis not present

## 2022-12-06 DIAGNOSIS — K589 Irritable bowel syndrome without diarrhea: Secondary | ICD-10-CM | POA: Diagnosis not present

## 2022-12-06 DIAGNOSIS — I214 Non-ST elevation (NSTEMI) myocardial infarction: Secondary | ICD-10-CM | POA: Diagnosis not present

## 2022-12-06 DIAGNOSIS — G629 Polyneuropathy, unspecified: Secondary | ICD-10-CM | POA: Diagnosis not present

## 2022-12-06 DIAGNOSIS — Z8249 Family history of ischemic heart disease and other diseases of the circulatory system: Secondary | ICD-10-CM | POA: Diagnosis not present

## 2022-12-06 DIAGNOSIS — I4891 Unspecified atrial fibrillation: Secondary | ICD-10-CM | POA: Diagnosis not present

## 2022-12-06 DIAGNOSIS — I1A0 Resistant hypertension: Secondary | ICD-10-CM | POA: Diagnosis not present

## 2022-12-06 LAB — LIPID PANEL
Cholesterol: 200 mg/dL (ref 0–200)
HDL: 28 mg/dL — ABNORMAL LOW (ref >40)
LDL Cholesterol: 134 mg/dL — ABNORMAL HIGH (ref 0–99)
Total CHOL/HDL Ratio: 7.1 ratio
Triglycerides: 190 mg/dL — ABNORMAL HIGH (ref <150)
VLDL: 38 mg/dL (ref 0–40)

## 2022-12-06 LAB — CBC
HCT: 46.4 % — ABNORMAL HIGH (ref 36.0–46.0)
Hemoglobin: 15.4 g/dL — ABNORMAL HIGH (ref 12.0–15.0)
MCH: 28.3 pg (ref 26.0–34.0)
MCHC: 33.2 g/dL (ref 30.0–36.0)
MCV: 85.3 fL (ref 80.0–100.0)
Platelets: 191 10*3/uL (ref 150–400)
RBC: 5.44 MIL/uL — ABNORMAL HIGH (ref 3.87–5.11)
RDW: 15.2 % (ref 11.5–15.5)
WBC: 8.1 10*3/uL (ref 4.0–10.5)
nRBC: 0 % (ref 0.0–0.2)

## 2022-12-06 LAB — COMPREHENSIVE METABOLIC PANEL WITH GFR
ALT: 15 U/L (ref 0–44)
AST: 20 U/L (ref 15–41)
Albumin: 3.9 g/dL (ref 3.5–5.0)
Alkaline Phosphatase: 133 U/L — ABNORMAL HIGH (ref 38–126)
Anion gap: 13 (ref 5–15)
BUN: 12 mg/dL (ref 6–20)
CO2: 25 mmol/L (ref 22–32)
Calcium: 9.8 mg/dL (ref 8.9–10.3)
Chloride: 103 mmol/L (ref 98–111)
Creatinine, Ser: 0.99 mg/dL (ref 0.44–1.00)
GFR, Estimated: >60 mL/min (ref >60)
Glucose, Bld: 112 mg/dL — ABNORMAL HIGH (ref 70–99)
Potassium: 3 mmol/L — ABNORMAL LOW (ref 3.5–5.1)
Sodium: 141 mmol/L (ref 135–145)
Total Bilirubin: 0.9 mg/dL (ref 0.3–1.2)
Total Protein: 7.7 g/dL (ref 6.5–8.1)

## 2022-12-06 LAB — BRAIN NATRIURETIC PEPTIDE: B Natriuretic Peptide: 51.9 pg/mL (ref 0.0–100.0)

## 2022-12-06 LAB — TROPONIN I (HIGH SENSITIVITY)
Troponin I (High Sensitivity): 72 ng/L — ABNORMAL HIGH (ref <18)
Troponin I (High Sensitivity): 63 ng/L — ABNORMAL HIGH (ref <18)

## 2022-12-06 LAB — HEMOGLOBIN A1C
Hgb A1c MFr Bld: 5.3 % (ref 4.8–5.6)
Mean Plasma Glucose: 105.41 mg/dL

## 2022-12-06 LAB — HIV ANTIBODY (ROUTINE TESTING W REFLEX): HIV Screen 4th Generation wRfx: NONREACTIVE

## 2022-12-06 LAB — MAGNESIUM: Magnesium: 1.9 mg/dL (ref 1.7–2.4)

## 2022-12-06 MED ORDER — LOSARTAN POTASSIUM 25 MG PO TABS
25.0000 mg | ORAL_TABLET | Freq: Every day | ORAL | Status: DC
  Administered 2022-12-06 – 2022-12-09 (×4): 25 mg via ORAL
  Filled 2022-12-06 (×4): qty 1

## 2022-12-06 MED ORDER — HYDROCHLOROTHIAZIDE 25 MG PO TABS
25.0000 mg | ORAL_TABLET | Freq: Every day | ORAL | Status: DC
  Administered 2022-12-07: 25 mg via ORAL
  Filled 2022-12-06: qty 1

## 2022-12-06 MED ORDER — ENOXAPARIN SODIUM 40 MG/0.4ML IJ SOSY
40.0000 mg | PREFILLED_SYRINGE | INTRAMUSCULAR | Status: DC
  Administered 2022-12-06 – 2022-12-08 (×3): 40 mg via SUBCUTANEOUS
  Filled 2022-12-06 (×3): qty 0.4

## 2022-12-06 MED ORDER — HYDROCHLOROTHIAZIDE 25 MG PO TABS: 25.0000 mg | ORAL_TABLET | Freq: Every day | ORAL | Status: DC

## 2022-12-06 MED ORDER — POTASSIUM CHLORIDE CRYS ER 20 MEQ PO TBCR
40.0000 meq | EXTENDED_RELEASE_TABLET | Freq: Once | ORAL | Status: AC
  Administered 2022-12-06: 40 meq via ORAL
  Filled 2022-12-06: qty 2

## 2022-12-06 MED ORDER — ONDANSETRON HCL 4 MG/2ML IJ SOLN
4.0000 mg | Freq: Four times a day (QID) | INTRAMUSCULAR | Status: DC | PRN
  Administered 2022-12-07: 4 mg via INTRAVENOUS
  Filled 2022-12-06: qty 2

## 2022-12-06 MED ORDER — LINACLOTIDE 72 MCG PO CAPS
72.0000 ug | ORAL_CAPSULE | Freq: Every day | ORAL | Status: DC
  Administered 2022-12-06 – 2022-12-09 (×4): 72 ug via ORAL
  Filled 2022-12-06 (×5): qty 1

## 2022-12-06 MED ORDER — METHOCARBAMOL 500 MG PO TABS
500.0000 mg | ORAL_TABLET | Freq: Three times a day (TID) | ORAL | Status: DC
  Administered 2022-12-06 – 2022-12-07 (×4): 500 mg via ORAL
  Filled 2022-12-06 (×4): qty 1

## 2022-12-06 MED ORDER — ONDANSETRON HCL 4 MG PO TABS: 4.0000 mg | ORAL_TABLET | Freq: Four times a day (QID) | ORAL | Status: DC | PRN

## 2022-12-06 MED ORDER — SODIUM CHLORIDE 0.9 % IV SOLN: 250.0000 mL | INTRAVENOUS | Status: DC | PRN

## 2022-12-06 MED ORDER — FUROSEMIDE 20 MG PO TABS
20.0000 mg | ORAL_TABLET | Freq: Two times a day (BID) | ORAL | Status: DC
  Administered 2022-12-06 – 2022-12-07 (×3): 20 mg via ORAL
  Filled 2022-12-06 (×3): qty 1

## 2022-12-06 MED ORDER — LABETALOL HCL 5 MG/ML IV SOLN: 20.0000 mg | Freq: Three times a day (TID) | INTRAVENOUS | Status: DC | PRN

## 2022-12-06 MED ORDER — POTASSIUM CHLORIDE 20 MEQ PO PACK
40.0000 meq | PACK | Freq: Once | ORAL | Status: AC
  Administered 2022-12-06: 40 meq via ORAL
  Filled 2022-12-06: qty 2

## 2022-12-06 MED ORDER — PANTOPRAZOLE SODIUM 20 MG PO TBEC
20.0000 mg | DELAYED_RELEASE_TABLET | Freq: Two times a day (BID) | ORAL | Status: DC
  Administered 2022-12-06 – 2022-12-07 (×3): 20 mg via ORAL
  Filled 2022-12-06 (×3): qty 1

## 2022-12-06 MED ORDER — SODIUM CHLORIDE 0.9% FLUSH: 3.0000 mL | INTRAVENOUS | Status: DC | PRN

## 2022-12-06 MED ORDER — VITAMIN B-12 1000 MCG PO TABS: 2000.0000 ug | ORAL_TABLET | Freq: Every day | ORAL | Status: DC

## 2022-12-06 MED ORDER — CARVEDILOL 3.125 MG PO TABS
3.1250 mg | ORAL_TABLET | Freq: Two times a day (BID) | ORAL | Status: DC
  Administered 2022-12-06: 3.125 mg via ORAL
  Filled 2022-12-06: qty 1

## 2022-12-06 MED ORDER — ATORVASTATIN CALCIUM 80 MG PO TABS
80.0000 mg | ORAL_TABLET | Freq: Every day | ORAL | Status: DC
  Administered 2022-12-06 – 2022-12-09 (×4): 80 mg via ORAL
  Filled 2022-12-06: qty 2
  Filled 2022-12-06 (×3): qty 1

## 2022-12-06 MED ORDER — SODIUM CHLORIDE 0.9% FLUSH
3.0000 mL | Freq: Two times a day (BID) | INTRAVENOUS | Status: DC
  Administered 2022-12-06 – 2022-12-09 (×8): 3 mL via INTRAVENOUS

## 2022-12-06 MED ORDER — GABAPENTIN 300 MG PO CAPS
300.0000 mg | ORAL_CAPSULE | Freq: Three times a day (TID) | ORAL | Status: DC
  Administered 2022-12-06 – 2022-12-09 (×9): 300 mg via ORAL
  Filled 2022-12-06 (×9): qty 1

## 2022-12-06 MED ORDER — HYDRALAZINE HCL 20 MG/ML IJ SOLN
10.0000 mg | Freq: Four times a day (QID) | INTRAMUSCULAR | Status: DC | PRN
  Administered 2022-12-07: 10 mg via INTRAVENOUS
  Filled 2022-12-06 (×2): qty 1

## 2022-12-06 MED ORDER — CARVEDILOL 6.25 MG PO TABS
6.2500 mg | ORAL_TABLET | Freq: Two times a day (BID) | ORAL | Status: DC
  Administered 2022-12-06 – 2022-12-08 (×6): 6.25 mg via ORAL
  Filled 2022-12-06 (×2): qty 1
  Filled 2022-12-06 (×2): qty 2
  Filled 2022-12-06 (×3): qty 1

## 2022-12-06 MED ORDER — DOCUSATE SODIUM 100 MG PO CAPS
200.0000 mg | ORAL_CAPSULE | Freq: Two times a day (BID) | ORAL | Status: DC
  Administered 2022-12-06 – 2022-12-09 (×5): 200 mg via ORAL
  Filled 2022-12-06 (×5): qty 2

## 2022-12-06 MED ORDER — AMLODIPINE BESYLATE 10 MG PO TABS
10.0000 mg | ORAL_TABLET | Freq: Every day | ORAL | Status: DC
  Administered 2022-12-06 – 2022-12-09 (×4): 10 mg via ORAL
  Filled 2022-12-06: qty 1
  Filled 2022-12-06: qty 2
  Filled 2022-12-06 (×2): qty 1

## 2022-12-06 NOTE — Hospital Course (Addendum)
Mrs. Brittany [name] was admitted to the hospital with the working diagnosis of uncontrolled hypertension.   57 yo female with the past medical history of chronic osteoarthritis, hypertension, CVA and peripheral neuropathy who presented with chest pain and dyspnea. Patient has not been compliant with her antihypertensive medications. At home she had dyspnea and palpitations on exertion, positive chest discomfort, prompting her to come to the ED. On her initial physical examination her blood pressure was 150/113, HR 87, RR 18 and 02 saturation 99% lungs with no wheezing or rales, heart with S1 and S2 present and regular, tachycardic, abdomen with no distention and no lower extremity edema.   Na 140, K 3,2 Cl 103, bicarbonate 23, glucose 106 bun 9 cr 1,0  High sensitive troponin 41 and 49  Wbc 6,1 hgb 16,4 plt 198   Chest radiograph with mild cardiomegaly with no infiltrates or effusions.   EKG 117 bpm, normal axis, normal intervals, sinus rhythm with no significant ST segment or T wave changes.   09/20 Blood pressure has improved, pending renal function to be more stable prior to discharge home.  09/21 renal function has improved, blood pressure with better control, she will follow up as outpatient for further adjustments in her blood pressure regimen.

## 2022-12-06 NOTE — ED Notes (Signed)
ED TO INPATIENT HANDOFF REPORT  ED Nurse Name and Phone #: Morrie Sheldon [phone]  S Name/Age/Gender Brittany [name] 57 y.o. female Room/Bed: 045C/045C  Code Status   Code Status: Full Code  Home/SNF/Other Home Patient oriented to: self, place, time, and situation Is this baseline? Yes   Triage Complete: Triage complete  Chief Complaint Hypertensive emergency [I16.1]  Triage Note Patient reports SOB with chest tightness/palpitations this week , no emesis or diaphoresis . Hypertensive at triage .    Allergies Allergies  Allergen Reactions   Amitriptyline Other (See Comments)    Patient reported that it made her throat feel like its locking up and it also caused issues with her going to the bathroom   Duloxetine Other (See Comments)    Patient reported that it made her throat lock up and it caused her to have issue with going to the bathroom   Naproxen     Vomiting, sweating, abd spasms   Other     States can't take pain meds that end in "cet" or meds that end in "ine" Darvocet/severe vomiting   Darvon [Propoxyphene] Nausea And Vomiting   Hydrocodone Nausea And Vomiting   Lactose Intolerance (Gi)     Bloating, gas, abd pain   Latex Itching and Rash   Oxycodone Nausea And Vomiting   Percocet [Oxycodone-Acetaminophen] Nausea And Vomiting   Topamax [Topiramate]     Memory made her emotional     Level of Care/Admitting Diagnosis ED Disposition     ED Disposition  Admit   Condition  --   Comment  Hospital Area: [facility] [[id number]]  Level of Care: Telemetry Cardiac [[id number]]  May admit patient to [facility] or [facility] if equivalent level of care is available:: No  Covid Evaluation: Asymptomatic - no recent exposure (last 10 days) testing not required  Diagnosis: Hypertensive emergency [id number]  Admitting Physician: Tereasa Coop [[id number]]  Attending Physician: Tereasa Coop [[id number]]  Certification:: I certify this patient will need inpatient  services for at least 2 midnights  Expected Medical Readiness: 12/09/2022          B Medical/Surgery History Past Medical History:  Diagnosis Date   Anemia    Arthritis    trigger finger in left hand   Complication of anesthesia    per pt, hard to wake up!   Elevated cholesterol    Female bladder prolapse    per urologist, does not have prolaspe   GERD (gastroesophageal reflux disease)    Heart murmur    pt unsure.   Hiatal hernia    Hypertension    IBS (irritable bowel syndrome)    Multiple myeloma (HCC) 2005   had partial chemo   Peripheral neuropathy    SOB (shortness of breath) on exertion    uses an inhaler   Stroke [facility]) 2017   paralysis left arm/uses a walker   Past Surgical History:  Procedure Laterality Date   BONE BIOPSY  2005   in her back   COLONOSCOPY     over 10 years x3   ESOPHAGOGASTRODUODENOSCOPY     incomplete-over 10 years ago    TEE WITHOUT CARDIOVERSION N/A 12/08/2016   Procedure: TRANSESOPHAGEAL ECHOCARDIOGRAM (TEE);  Surgeon: Quintella Reichert, MD;  Location: [facility] ENDOSCOPY;  Service: Cardiovascular;  Laterality: N/A;   TUBAL LIGATION     UPPER GASTROINTESTINAL ENDOSCOPY     WISDOM TOOTH EXTRACTION       A IV Location/Drains/Wounds Patient Lines/Drains/Airways Status  Active Line/Drains/Airways     Name Placement date Placement time Site Days   Peripheral IV 12/05/22 20 G 1" Right;Upper Forearm 12/05/22  2133  Forearm  1            Intake/Output Last 24 hours No intake or output data in the 24 hours ending 12/06/22 1616  Labs/Imaging Results for orders placed or performed during the hospital encounter of 12/05/22 (from the past 48 hour(s))  Troponin I (High Sensitivity)     Status: Abnormal   Collection Time: 12/05/22  9:24 PM  Result Value Ref Range   Troponin I (High Sensitivity) 54 (H) <18 ng/L    Comment: (NOTE) Elevated high sensitivity troponin I (hsTnI) values and significant  changes across serial measurements may  suggest ACS but many other  chronic and acute conditions are known to elevate hsTnI results.  Refer to the "Links" section for chest pain algorithms and additional  guidance. Performed at [facility], [address], [place], [place] [address]   D-dimer, quantitative     Status: Abnormal   Collection Time: 12/05/22  9:24 PM  Result Value Ref Range   D-Dimer, Quant 0.53 (H) 0.00 - 0.50 ug/mL-FEU    Comment: (NOTE) At the manufacturer cut-off value of 0.5 g/mL FEU, this assay has a negative predictive value of 95-100%.This assay is intended for use in conjunction with a clinical pretest probability (PTP) assessment model to exclude pulmonary embolism (PE) and deep venous thrombosis (DVT) in outpatients suspected of PE or DVT. Results should be correlated with clinical presentation. Performed at [facility], [address] [place], [place] [address]   Troponin I (High Sensitivity)     Status: Abnormal   Collection Time: 12/05/22 11:35 PM  Result Value Ref Range   Troponin I (High Sensitivity) 65 (H) <18 ng/L    Comment: (NOTE) Elevated high sensitivity troponin I (hsTnI) values and significant  changes across serial measurements may suggest ACS but many other  chronic and acute conditions are known to elevate hsTnI results.  Refer to the "Links" section for chest pain algorithms and additional  guidance. Performed at [facility], [address], [place], [place] [address]   HIV Antibody (routine testing w rflx)     Status: None   Collection Time: 12/06/22  1:54 AM  Result Value Ref Range   HIV Screen 4th Generation wRfx Non Reactive Non Reactive    Comment: Performed at [facility], [address] [place], [place] [address]  CBC     Status: Abnormal   Collection Time: 12/06/22  1:54 AM  Result Value Ref Range   WBC 8.1 4.0 - 10.5 K/uL   RBC 5.44 (H) 3.87 - 5.11 MIL/uL   Hemoglobin 15.4 (H) 12.0 - 15.0 g/dL   HCT 29.5 (H) 62.1 - 30.8 %   MCV  85.3 80.0 - 100.0 fL   MCH 28.3 26.0 - 34.0 pg   MCHC 33.2 30.0 - 36.0 g/dL   RDW 65.7 84.6 - 96.2 %   Platelets 191 150 - 400 K/uL    Comment: REPEATED TO VERIFY   nRBC 0.0 0.0 - 0.2 %    Comment: Performed at [facility], [address] [place], [place] [address]  Comprehensive metabolic panel     Status: Abnormal   Collection Time: 12/06/22  1:54 AM  Result Value Ref Range   Sodium 141 135 - 145 mmol/L   Potassium 3.0 (L) 3.5 -  5.1 mmol/L   Chloride 103 98 - 111 mmol/L   CO2 25 22 - 32 mmol/L   Glucose, Bld 112 (H) 70 - 99 mg/dL    Comment: Glucose reference range applies only to samples taken after fasting for at least 8 hours.   BUN 12 6 - 20 mg/dL   Creatinine, Ser 0.98 0.44 - 1.00 mg/dL   Calcium 9.8 8.9 - 11.9 mg/dL   Total Protein 7.7 6.5 - 8.1 g/dL   Albumin 3.9 3.5 - 5.0 g/dL   AST 20 15 - 41 U/L   ALT 15 0 - 44 U/L   Alkaline Phosphatase 133 (H) 38 - 126 U/L   Total Bilirubin 0.9 0.3 - 1.2 mg/dL   GFR, Estimated >14 >78 mL/min    Comment: (NOTE) Calculated using the CKD-EPI Creatinine Equation (2021)    Anion gap 13 5 - 15    Comment: Performed at [facility], [address] [place], [place] [address]  Brain natriuretic peptide     Status: None   Collection Time: 12/06/22  1:54 AM  Result Value Ref Range   B Natriuretic Peptide 51.9 0.0 - 100.0 pg/mL    Comment: Performed at [facility], [address], [place], [place] [address]  Hemoglobin A1c     Status: None   Collection Time: 12/06/22  1:54 AM  Result Value Ref Range   Hgb A1c MFr Bld 5.3 4.8 - 5.6 %    Comment: (NOTE) Pre diabetes:          5.7%-6.4%  Diabetes:              >6.4%  Glycemic control for   <7.0% adults with diabetes    Mean Plasma Glucose 105.41 mg/dL    Comment: Performed at [facility], [address] [place], [place] [address]  Lipid panel     Status: Abnormal   Collection Time: 12/06/22  1:54 AM  Result Value Ref Range   Cholesterol 200 0 - 200  mg/dL   Triglycerides 962 (H) <150 mg/dL   HDL 28 (L) >95 mg/dL   Total CHOL/HDL Ratio 7.1 RATIO   VLDL 38 0 - 40 mg/dL   LDL Cholesterol 284 (H) 0 - 99 mg/dL    Comment:        Total Cholesterol/HDL:CHD Risk Coronary Heart Disease Risk Table                     Men   Women  1/2 Average Risk   3.4   3.3  Average Risk       5.0   4.4  2 X Average Risk   9.6   7.1  3 X Average Risk  23.4   11.0        Use the calculated Patient Ratio above and the CHD Risk Table to determine the patient's CHD Risk.        ATP III CLASSIFICATION (LDL):  <100     mg/dL   Optimal  132-440  mg/dL   Near or Above                    Optimal  130-159  mg/dL   Borderline  102-725  mg/dL   High  >366     mg/dL   Very High Performed at [facility], [address], [place], [place] [address]   Magnesium     Status: None  Collection Time: 12/06/22  1:54 AM  Result Value Ref Range   Magnesium 1.9 1.7 - 2.4 mg/dL    Comment: Performed at [facility], [address] [place], [place] [address]  Troponin I (High Sensitivity)     Status: Abnormal   Collection Time: 12/06/22  4:10 AM  Result Value Ref Range   Troponin I (High Sensitivity) 72 (H) <18 ng/L    Comment: (NOTE) Elevated high sensitivity troponin I (hsTnI) values and significant  changes across serial measurements may suggest ACS but many other  chronic and acute conditions are known to elevate hsTnI results.  Refer to the "Links" section for chest pain algorithms and additional  guidance. Performed at [facility], [address] [place], [place] [address]   Troponin I (High Sensitivity)     Status: Abnormal   Collection Time: 12/06/22  6:00 AM  Result Value Ref Range   Troponin I (High Sensitivity) 63 (H) <18 ng/L    Comment: (NOTE) Elevated high sensitivity troponin I (hsTnI) values and significant  changes across serial measurements may suggest ACS but many other  chronic and acute conditions are known to elevate  hsTnI results.  Refer to the "Links" section for chest pain algorithms and additional  guidance. Performed at [facility], [address], [place], [place] [address]    DG Chest 2 View  Result Date: 12/05/2022 CLINICAL DATA:  Shortness of breath EXAM: CHEST - 2 VIEW COMPARISON:  12/05/2022 FINDINGS: Lungs are clear.  No pleural effusion or pneumothorax. The heart is normal in size. Visualized osseous structures are within normal limits. IMPRESSION: Normal chest radiographs. Electronically Signed   By: Charline Bills M.D.   On: 12/05/2022 21:39   DG Chest Port 1 View  Result Date: 12/05/2022 CLINICAL DATA:  Atrial fibrillation. EXAM: PORTABLE CHEST 1 VIEW COMPARISON:  10/15/2018 FINDINGS: The heart is upper normal in size. Mediastinal contours are normal. No focal airspace disease, pleural effusion, or pneumothorax. Pulmonary vasculature is normal. No acute osseous findings. IMPRESSION: Borderline cardiomegaly. No acute pulmonary process. Electronically Signed   By: Narda Rutherford M.D.   On: 12/05/2022 15:37    Pending Labs Unresulted Labs (From admission, onward)     Start     Ordered   12/06/22 0500  CBC  Daily,   R      12/06/22 0124   12/06/22 0500  Comprehensive metabolic panel  Daily,   R      12/06/22 0124            Vitals/Pain Today's Vitals   12/06/22 1012 12/06/22 1100 12/06/22 1400 12/06/22 1612  BP:  (!) 145/64 (!) 141/81 (!) 163/87  Pulse:  72 85 97  Resp:  14 16 17   Temp: 98.3 F (36.8 C)   97.9 F (36.6 C)  TempSrc: Oral   Oral  SpO2:  98% 98% 99%  PainSc:        Isolation Precautions No active isolations  Medications Medications  aspirin tablet 325 mg (325 mg Oral Given 12/06/22 1011)  atorvastatin (LIPITOR) tablet 80 mg (80 mg Oral Given 12/06/22 1011)  docusate sodium (COLACE) capsule 200 mg (200 mg Oral Given 12/06/22 1011)  linaclotide (LINZESS) capsule 72 mcg (72 mcg Oral Given 12/06/22 1249)  pantoprazole (PROTONIX) EC tablet 20 mg  (20 mg Oral Given 12/06/22 1011)  gabapentin (NEURONTIN) capsule 300 mg (300 mg Oral Given 12/06/22 1609)  methocarbamol (ROBAXIN) tablet 500 mg (500 mg Oral Given 12/06/22  1609)  enoxaparin (LOVENOX) injection 40 mg (40 mg Subcutaneous Given 12/06/22 1250)  sodium chloride flush (NS) 0.9 % injection 3 mL (3 mLs Intravenous Given 12/06/22 1012)  sodium chloride flush (NS) 0.9 % injection 3 mL (has no administration in time range)  0.9 %  sodium chloride infusion (has no administration in time range)  ondansetron (ZOFRAN) tablet 4 mg (has no administration in time range)    Or  ondansetron (ZOFRAN) injection 4 mg (has no administration in time range)  amLODipine (NORVASC) tablet 10 mg (10 mg Oral Given 12/06/22 1010)  hydrochlorothiazide (HYDRODIURIL) tablet 25 mg (has no administration in time range)  furosemide (LASIX) tablet 20 mg (20 mg Oral Given 12/06/22 0739)  carvedilol (COREG) tablet 6.25 mg (6.25 mg Oral Given 12/06/22 1609)  hydrALAZINE (APRESOLINE) injection 10 mg (has no administration in time range)  potassium chloride (KLOR-CON) packet 40 mEq (40 mEq Oral Given 12/06/22 0202)    Mobility walks with device     Focused Assessments See Chart   R Recommendations: See Admitting Provider Note  Report given to:   Additional Notes: see chart

## 2022-12-06 NOTE — Assessment & Plan Note (Deleted)
Hypokalemia.

## 2022-12-06 NOTE — Assessment & Plan Note (Signed)
Old records personally reviewed, neurology follow up from 12/2020. Seropositive ocular myasthenia gravis, no significant bulbar, or limb weakness noted.  He is not longer on Mestinon or prednisone, never treated with long term steroids sparing agent.

## 2022-12-06 NOTE — ED Notes (Signed)
ED TO INPATIENT HANDOFF REPORT  ED Nurse Name and Phone #: [phone]  S Name/Age/Gender Brittany [name] 57 y.o. female Room/Bed: 001C/001C  Code Status   Code Status: Full Code  Home/SNF/Other Home Patient oriented to: self, place, time, and situation Is this baseline? Yes   Triage Complete: Triage complete  Chief Complaint Hypertensive emergency [I16.1]  Triage Note Patient reports SOB with chest tightness/palpitations this week , no emesis or diaphoresis . Hypertensive at triage .    Allergies Allergies  Allergen Reactions   Amitriptyline Other (See Comments)    Patient reported that it made her throat feel like its locking up and it also caused issues with her going to the bathroom   Duloxetine Other (See Comments)    Patient reported that it made her throat lock up and it caused her to have issue with going to the bathroom   Naproxen     Vomiting, sweating, abd spasms   Other     States can't take pain meds that end in "cet" or meds that end in "ine" Darvocet/severe vomiting   Darvon [Propoxyphene] Nausea And Vomiting   Hydrocodone Nausea And Vomiting   Lactose Intolerance (Gi)     Bloating, gas, abd pain   Latex Itching and Rash   Oxycodone Nausea And Vomiting   Percocet [Oxycodone-Acetaminophen] Nausea And Vomiting   Topamax [Topiramate]     Memory made her emotional     Level of Care/Admitting Diagnosis ED Disposition     ED Disposition  Admit   Condition  --   Comment  Hospital [facility]: [facility] [[id number]]  Level of Care: Telemetry Cardiac [[id number]]  May admit patient to [facility] or [facility] if equivalent level of care is available:: No  Covid Evaluation: Asymptomatic - no recent exposure (last 10 days) testing not required  Diagnosis: Hypertensive emergency [phone]  Admitting Physician: Tereasa Coop [[id number]]  Attending Physician: Tereasa Coop [[id number]]  Certification:: I certify this patient will need inpatient services for  at least 2 midnights  Expected Medical Readiness: 12/09/2022          B Medical/Surgery History Past Medical History:  Diagnosis Date   Anemia    Arthritis    trigger finger in left hand   Complication of anesthesia    per pt, hard to wake up!   Elevated cholesterol    Female bladder prolapse    per urologist, does not have prolaspe   GERD (gastroesophageal reflux disease)    Heart murmur    pt unsure.   Hiatal hernia    Hypertension    IBS (irritable bowel syndrome)    Multiple myeloma (HCC) 2005   had partial chemo   Peripheral neuropathy    SOB (shortness of breath) on exertion    uses an inhaler   Stroke [facility]) 2017   paralysis left arm/uses a walker   Past Surgical History:  Procedure Laterality Date   BONE BIOPSY  2005   in her back   COLONOSCOPY     over 10 years x3   ESOPHAGOGASTRODUODENOSCOPY     incomplete-over 10 years ago    TEE WITHOUT CARDIOVERSION N/A 12/08/2016   Procedure: TRANSESOPHAGEAL ECHOCARDIOGRAM (TEE);  Surgeon: Quintella Reichert, MD;  Location: [facility] ENDOSCOPY;  Service: Cardiovascular;  Laterality: N/A;   TUBAL LIGATION     UPPER GASTROINTESTINAL ENDOSCOPY     WISDOM TOOTH EXTRACTION       A IV Location/Drains/Wounds Patient Lines/Drains/Airways Status     Active  Line/Drains/Airways     Name Placement date Placement time Site Days   Peripheral IV 12/05/22 20 G 1" Right;Upper Forearm 12/05/22  2133  Forearm  1            Intake/Output Last 24 hours No intake or output data in the 24 hours ending 12/06/22 1515  Labs/Imaging Results for orders placed or performed during the hospital encounter of 12/05/22 (from the past 48 hour(s))  Troponin I (High Sensitivity)     Status: Abnormal   Collection Time: 12/05/22  9:24 PM  Result Value Ref Range   Troponin I (High Sensitivity) 54 (H) <18 ng/L    Comment: (NOTE) Elevated high sensitivity troponin I (hsTnI) values and significant  changes across serial measurements may suggest ACS  but many other  chronic and acute conditions are known to elevate hsTnI results.  Refer to the "Links" section for chest pain algorithms and additional  guidance. Performed at [facility], [address] [place], [place] [address]   D-dimer, quantitative     Status: Abnormal   Collection Time: 12/05/22  9:24 PM  Result Value Ref Range   D-Dimer, Quant 0.53 (H) 0.00 - 0.50 ug/mL-FEU    Comment: (NOTE) At the manufacturer cut-off value of 0.5 g/mL FEU, this assay has a negative predictive value of 95-100%.This assay is intended for use in conjunction with a clinical pretest probability (PTP) assessment model to exclude pulmonary embolism (PE) and deep venous thrombosis (DVT) in outpatients suspected of PE or DVT. Results should be correlated with clinical presentation. Performed at [facility], [address] [place], [place] [address]   Troponin I (High Sensitivity)     Status: Abnormal   Collection Time: 12/05/22 11:35 PM  Result Value Ref Range   Troponin I (High Sensitivity) 65 (H) <18 ng/L    Comment: (NOTE) Elevated high sensitivity troponin I (hsTnI) values and significant  changes across serial measurements may suggest ACS but many other  chronic and acute conditions are known to elevate hsTnI results.  Refer to the "Links" section for chest pain algorithms and additional  guidance. Performed at [facility], [address] [place], [place] [address]   HIV Antibody (routine testing w rflx)     Status: None   Collection Time: 12/06/22  1:54 AM  Result Value Ref Range   HIV Screen 4th Generation wRfx Non Reactive Non Reactive    Comment: Performed at [facility], [address], [place], [place] [address]  CBC     Status: Abnormal   Collection Time: 12/06/22  1:54 AM  Result Value Ref Range   WBC 8.1 4.0 - 10.5 K/uL   RBC 5.44 (H) 3.87 - 5.11 MIL/uL   Hemoglobin 15.4 (H) 12.0 - 15.0 g/dL   HCT 32.4 (H) 40.1 - 02.7 %   MCV 85.3 80.0 -  100.0 fL   MCH 28.3 26.0 - 34.0 pg   MCHC 33.2 30.0 - 36.0 g/dL   RDW 25.3 66.4 - 40.3 %   Platelets 191 150 - 400 K/uL    Comment: REPEATED TO VERIFY   nRBC 0.0 0.0 - 0.2 %    Comment: Performed at [facility], [address], [place], [place] [address]  Comprehensive metabolic panel     Status: Abnormal   Collection Time: 12/06/22  1:54 AM  Result Value Ref Range   Sodium 141 135 - 145 mmol/L   Potassium 3.0 (L) 3.5 -  5.1 mmol/L   Chloride 103 98 - 111 mmol/L   CO2 25 22 - 32 mmol/L   Glucose, Bld 112 (H) 70 - 99 mg/dL    Comment: Glucose reference range applies only to samples taken after fasting for at least 8 hours.   BUN 12 6 - 20 mg/dL   Creatinine, Ser 0.34 0.44 - 1.00 mg/dL   Calcium 9.8 8.9 - 74.2 mg/dL   Total Protein 7.7 6.5 - 8.1 g/dL   Albumin 3.9 3.5 - 5.0 g/dL   AST 20 15 - 41 U/L   ALT 15 0 - 44 U/L   Alkaline Phosphatase 133 (H) 38 - 126 U/L   Total Bilirubin 0.9 0.3 - 1.2 mg/dL   GFR, Estimated >59 >56 mL/min    Comment: (NOTE) Calculated using the CKD-EPI Creatinine Equation (2021)    Anion gap 13 5 - 15    Comment: Performed at [facility], [address], [place], [place] [address]  Brain natriuretic peptide     Status: None   Collection Time: 12/06/22  1:54 AM  Result Value Ref Range   B Natriuretic Peptide 51.9 0.0 - 100.0 pg/mL    Comment: Performed at [facility], [address] [place], [place] [address]  Hemoglobin A1c     Status: None   Collection Time: 12/06/22  1:54 AM  Result Value Ref Range   Hgb A1c MFr Bld 5.3 4.8 - 5.6 %    Comment: (NOTE) Pre diabetes:          5.7%-6.4%  Diabetes:              >6.4%  Glycemic control for   <7.0% adults with diabetes    Mean Plasma Glucose 105.41 mg/dL    Comment: Performed at [facility], [address], [place], [place] [address]  Lipid panel     Status: Abnormal   Collection Time: 12/06/22  1:54 AM  Result Value Ref Range   Cholesterol 200 0 - 200 mg/dL    Triglycerides 166 (H) <150 mg/dL   HDL 28 (L) >06 mg/dL   Total CHOL/HDL Ratio 7.1 RATIO   VLDL 38 0 - 40 mg/dL   LDL Cholesterol 301 (H) 0 - 99 mg/dL    Comment:        Total Cholesterol/HDL:CHD Risk Coronary Heart Disease Risk Table                     Men   Women  1/2 Average Risk   3.4   3.3  Average Risk       5.0   4.4  2 X Average Risk   9.6   7.1  3 X Average Risk  23.4   11.0        Use the calculated Patient Ratio above and the CHD Risk Table to determine the patient's CHD Risk.        ATP III CLASSIFICATION (LDL):  <100     mg/dL   Optimal  601-093  mg/dL   Near or Above                    Optimal  130-159  mg/dL   Borderline  235-573  mg/dL   High  >220     mg/dL   Very High Performed at [facility], [address], [place], [place] [address]   Magnesium     Status: None  Collection Time: 12/06/22  1:54 AM  Result Value Ref Range   Magnesium 1.9 1.7 - 2.4 mg/dL    Comment: Performed at [facility], [address] [place], [place] [address]  Troponin I (High Sensitivity)     Status: Abnormal   Collection Time: 12/06/22  4:10 AM  Result Value Ref Range   Troponin I (High Sensitivity) 72 (H) <18 ng/L    Comment: (NOTE) Elevated high sensitivity troponin I (hsTnI) values and significant  changes across serial measurements may suggest ACS but many other  chronic and acute conditions are known to elevate hsTnI results.  Refer to the "Links" section for chest pain algorithms and additional  guidance. Performed at [facility], [address], [place], [place] [address]   Troponin I (High Sensitivity)     Status: Abnormal   Collection Time: 12/06/22  6:00 AM  Result Value Ref Range   Troponin I (High Sensitivity) 63 (H) <18 ng/L    Comment: (NOTE) Elevated high sensitivity troponin I (hsTnI) values and significant  changes across serial measurements may suggest ACS but many other  chronic and acute conditions are known to elevate hsTnI  results.  Refer to the "Links" section for chest pain algorithms and additional  guidance. Performed at [facility], [address] [place], [place] [address]    DG Chest 2 View  Result Date: 12/05/2022 CLINICAL DATA:  Shortness of breath EXAM: CHEST - 2 VIEW COMPARISON:  12/05/2022 FINDINGS: Lungs are clear.  No pleural effusion or pneumothorax. The heart is normal in size. Visualized osseous structures are within normal limits. IMPRESSION: Normal chest radiographs. Electronically Signed   By: Charline Bills M.D.   On: 12/05/2022 21:39   DG Chest Port 1 View  Result Date: 12/05/2022 CLINICAL DATA:  Atrial fibrillation. EXAM: PORTABLE CHEST 1 VIEW COMPARISON:  10/15/2018 FINDINGS: The heart is upper normal in size. Mediastinal contours are normal. No focal airspace disease, pleural effusion, or pneumothorax. Pulmonary vasculature is normal. No acute osseous findings. IMPRESSION: Borderline cardiomegaly. No acute pulmonary process. Electronically Signed   By: Narda Rutherford M.D.   On: 12/05/2022 15:37    Pending Labs Unresulted Labs (From admission, onward)     Start     Ordered   12/06/22 0500  CBC  Daily,   R      12/06/22 0124   12/06/22 0500  Comprehensive metabolic panel  Daily,   R      12/06/22 0124            Vitals/Pain Today's Vitals   12/06/22 0900 12/06/22 1012 12/06/22 1100 12/06/22 1400  BP: (!) 144/90  (!) 145/64 (!) 141/81  Pulse: 82  72 85  Resp: 20  14 16   Temp:  98.3 F (36.8 C)    TempSrc:  Oral    SpO2: 97%  98% 98%  PainSc:        Isolation Precautions No active isolations  Medications Medications  aspirin tablet 325 mg (325 mg Oral Given 12/06/22 1011)  atorvastatin (LIPITOR) tablet 80 mg (80 mg Oral Given 12/06/22 1011)  docusate sodium (COLACE) capsule 200 mg (200 mg Oral Given 12/06/22 1011)  linaclotide (LINZESS) capsule 72 mcg (72 mcg Oral Given 12/06/22 1249)  pantoprazole (PROTONIX) EC tablet 20 mg (20 mg Oral Given 12/06/22  1011)  gabapentin (NEURONTIN) capsule 300 mg (300 mg Oral Given 12/06/22 1011)  methocarbamol (ROBAXIN) tablet 500 mg (500 mg Oral Given 12/06/22 1011)  enoxaparin (  LOVENOX) injection 40 mg (40 mg Subcutaneous Given 12/06/22 1250)  sodium chloride flush (NS) 0.9 % injection 3 mL (3 mLs Intravenous Given 12/06/22 1012)  sodium chloride flush (NS) 0.9 % injection 3 mL (has no administration in time range)  0.9 %  sodium chloride infusion (has no administration in time range)  ondansetron (ZOFRAN) tablet 4 mg (has no administration in time range)    Or  ondansetron (ZOFRAN) injection 4 mg (has no administration in time range)  amLODipine (NORVASC) tablet 10 mg (10 mg Oral Given 12/06/22 1010)  hydrochlorothiazide (HYDRODIURIL) tablet 25 mg (has no administration in time range)  furosemide (LASIX) tablet 20 mg (20 mg Oral Given 12/06/22 0739)  carvedilol (COREG) tablet 6.25 mg (6.25 mg Oral Given 12/06/22 0739)  hydrALAZINE (APRESOLINE) injection 10 mg (has no administration in time range)  potassium chloride (KLOR-CON) packet 40 mEq (40 mEq Oral Given 12/06/22 0202)    Mobility walks     Focused Assessments Cardiac Assessment Handoff:  Cardiac Rhythm: Normal sinus rhythm Lab Results  Component Value Date   TROPONINI <0.03 12/19/2016   Lab Results  Component Value Date   DDIMER 0.53 (H) 12/05/2022   Does the Patient currently have chest pain? No    R Recommendations: See Admitting Provider Note  Report given to:   Additional Notes:

## 2022-12-06 NOTE — Assessment & Plan Note (Addendum)
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   Patient was placed on oral antihypertensive medications, with improvement in her symptoms.   Plan to continue medical therapy with amlodipine hydralazine, losartan, and chlorthalidone. Will hold on carvedilol to simplify medical regimen.  As outpatient can further increase dose of losartan and wean off hydralazine.   Echocardiogram with preserved LV systolic function 70 to 75% EF, severe LVH, RV systolic function preserved, no significant valvular disease. RVSP 18,8 mmHg. No wall motion abnormalities.   Troponin elevation due to uncontrolled hypertension, she ruled out for acute coronary syndrome.

## 2022-12-06 NOTE — Assessment & Plan Note (Signed)
Out of bed as tolerated

## 2022-12-06 NOTE — Assessment & Plan Note (Addendum)
Renal function with serum cr at 1,3 with K at 3,8 and serum bicarbonate at 24. Na 138   Continue to hold on loop diuretic, continue with thiazide and ARB for blood pressure control.  Follow up renal function in am.

## 2022-12-06 NOTE — Assessment & Plan Note (Addendum)
Patient was placed on pantoprazole and sucralfate for dyspepsia with improvement in her symptoms.  At the time of her discharge she will continue with proton pump inhibitors.

## 2022-12-06 NOTE — Progress Notes (Incomplete)
Progress Note   Patient: Brittany [name] [id number] DOB: 11-27-1965 DOA: 12/05/2022     0 DOS: the patient was seen and examined on 12/06/2022   Brief hospital course: No notes on file  Assessment and Plan: No notes have been filed under this hospital service. Service: Hospitalist     {Tip this will not be part of the note when signed There is no height or weight on file to calculate BMI. , ,  (Optional):26781}  Subjective: ***  Physical Exam: Vitals:   12/06/22 0800 12/06/22 0900 12/06/22 1012 12/06/22 1100  BP: (!) 161/99 (!) 144/90  (!) 145/64  Pulse:  82  72  Resp: (!) 25 20  14   Temp:   98.3 F (36.8 C)   TempSrc:   Oral   SpO2:  97%  98%   *** Data Reviewed: {Tip this will not be part of the note when signed- Document your independent interpretation of telemetry tracing, EKG, lab, Radiology test or any other diagnostic tests. Add any new diagnostic test ordered today. (Optional):26781} {Results:26384}  Family Communication: ***  Disposition: Status is: Inpatient {Inpatient:23812}  Planned Discharge Destination: {DISCHARGE DESTINATION_TRH:27031} {Tip this will not be part of the note when signed  DVT Prophylaxis  ., Place ted hose  Scds  Enoxaparin (lovenox) injection 40 mg  (Optional):26781}   Time spent: *** minutes  Author: Coralie Keens, MD 12/06/2022 11:45 AM  For on call review [email or web address].

## 2022-12-06 NOTE — Assessment & Plan Note (Signed)
Calculated BMI is 30,04

## 2022-12-06 NOTE — ED Notes (Signed)
[facility]afeteria called for pt to get vegetarian tray

## 2022-12-06 NOTE — Assessment & Plan Note (Addendum)
Continue blood pressure control.  Continue with aspirin

## 2022-12-06 NOTE — Progress Notes (Signed)
Progress Note   Patient: Brittany [name] [id number] DOB: 30-Sep-1965 DOA: 12/05/2022     0 DOS: the patient was seen and examined on 12/06/2022   Brief hospital course: Mrs. Brittany [name] was admitted to the hospital with the working diagnosis of uncontrolled hypertension.   57 yo female with the past medical history of chronic osteoarthritis, hypertension, CVA and peripheral neuropathy who presented with chest pain and dyspnea. Patient has not been compliant with her antihypertensive medications. At home she had dyspnea and palpitations on exertion, positive chest discomfort, prompting her to come to the ED. On her initial physical examination her blood pressure was 150/113, HR 87, RR 18 and 02 saturation 99% lungs with no wheezing or rales, heart with S1 and S2 present and regular, tachycardic, abdomen with no distention and no lower extremity edema.   Assessment and Plan: * Hypertensive emergency Blood pressure has improved with oral medical regimen of amlodipine, carvedilol and hydrochlorothiazide.  Add low dose losartan and continue close blood pressure monitoring.   AKI (acute kidney injury) (HCC) Hypokalemia.     Hypokalemia Renal function stable, continue K correction with Kcl with 40 meq x 2 doses.  Follow up renal function and electrolytes.    GERD (gastroesophageal reflux disease) Continue with PPI   History of CVA (cerebrovascular accident) Continue blood pressure control.   Chronic osteoarthritis Out of bed as tolerated.   IBS (irritable bowel syndrome) No signs of acute exacerbation   Class 1 obesity Calculated BMI is 30,04        Subjective: Patient is feeling better, no chest pain or dyspnea, no nausea or vomiting.   Physical Exam: Vitals:   12/06/22 1012 12/06/22 1100 12/06/22 1400 12/06/22 1612  BP:  (!) 145/64 (!) 141/81 (!) 163/87  Pulse:  72 85 97  Resp:  14 16 17   Temp: 98.3 F (36.8 C)   97.9 F (36.6 C)  TempSrc: Oral   Oral  SpO2:  98%  98% 99%   Neurology awake and alert ENT with mild pallor Cardiovascular with S1 and S2 present and regular with no gallops Respiratory with no rales or wheezing, no rhonchi Abdomen with no distention  Data Reviewed:    Family Communication: no family at the bedside   Disposition: Status is: Inpatient Remains inpatient appropriate because: uncontrolled hypertension   Planned Discharge Destination: Home  Author: Coralie Keens, MD 12/06/2022 4:56 PM  For on call review [email or web address].

## 2022-12-07 ENCOUNTER — Inpatient Hospital Stay (HOSPITAL_COMMUNITY): Payer: Medicaid Other

## 2022-12-07 DIAGNOSIS — I10 Essential (primary) hypertension: Secondary | ICD-10-CM | POA: Diagnosis not present

## 2022-12-07 DIAGNOSIS — E7800 Pure hypercholesterolemia, unspecified: Secondary | ICD-10-CM | POA: Diagnosis not present

## 2022-12-07 DIAGNOSIS — I214 Non-ST elevation (NSTEMI) myocardial infarction: Secondary | ICD-10-CM

## 2022-12-07 DIAGNOSIS — Z6830 Body mass index (BMI) 30.0-30.9, adult: Secondary | ICD-10-CM | POA: Diagnosis not present

## 2022-12-07 DIAGNOSIS — I161 Hypertensive emergency: Secondary | ICD-10-CM | POA: Diagnosis not present

## 2022-12-07 DIAGNOSIS — Z79899 Other long term (current) drug therapy: Secondary | ICD-10-CM | POA: Diagnosis not present

## 2022-12-07 DIAGNOSIS — E876 Hypokalemia: Secondary | ICD-10-CM | POA: Diagnosis not present

## 2022-12-07 DIAGNOSIS — K2100 Gastro-esophageal reflux disease with esophagitis, without bleeding: Secondary | ICD-10-CM | POA: Diagnosis not present

## 2022-12-07 DIAGNOSIS — E669 Obesity, unspecified: Secondary | ICD-10-CM | POA: Diagnosis not present

## 2022-12-07 DIAGNOSIS — N179 Acute kidney failure, unspecified: Secondary | ICD-10-CM | POA: Diagnosis not present

## 2022-12-07 DIAGNOSIS — K219 Gastro-esophageal reflux disease without esophagitis: Secondary | ICD-10-CM | POA: Diagnosis not present

## 2022-12-07 DIAGNOSIS — Z8249 Family history of ischemic heart disease and other diseases of the circulatory system: Secondary | ICD-10-CM | POA: Diagnosis not present

## 2022-12-07 DIAGNOSIS — K589 Irritable bowel syndrome without diarrhea: Secondary | ICD-10-CM | POA: Diagnosis not present

## 2022-12-07 DIAGNOSIS — M1990 Unspecified osteoarthritis, unspecified site: Secondary | ICD-10-CM | POA: Diagnosis not present

## 2022-12-07 DIAGNOSIS — Z87891 Personal history of nicotine dependence: Secondary | ICD-10-CM | POA: Diagnosis not present

## 2022-12-07 LAB — COMPREHENSIVE METABOLIC PANEL
ALT: 12 U/L (ref 0–44)
AST: 20 U/L (ref 15–41)
Albumin: 3.5 g/dL (ref 3.5–5.0)
Alkaline Phosphatase: 116 U/L (ref 38–126)
Anion gap: 9 (ref 5–15)
BUN: 15 mg/dL (ref 6–20)
CO2: 23 mmol/L (ref 22–32)
Calcium: 9.1 mg/dL (ref 8.9–10.3)
Chloride: 105 mmol/L (ref 98–111)
Creatinine, Ser: 1.13 mg/dL — ABNORMAL HIGH (ref 0.44–1.00)
GFR, Estimated: 57 mL/min — ABNORMAL LOW (ref >60)
Glucose, Bld: 121 mg/dL — ABNORMAL HIGH (ref 70–99)
Potassium: 3.8 mmol/L (ref 3.5–5.1)
Sodium: 137 mmol/L (ref 135–145)
Total Bilirubin: 0.9 mg/dL (ref 0.3–1.2)
Total Protein: 6.8 g/dL (ref 6.5–8.1)

## 2022-12-07 LAB — ECHOCARDIOGRAM COMPLETE
Area-P 1/2: 4.57 cm2
Height: 61 in
MV M vel: 1.44 m/s
MV Peak grad: 8.2 mmHg
S' Lateral: 2.5 cm
Weight: 2537.6 oz

## 2022-12-07 LAB — CBC
HCT: 44.5 % (ref 36.0–46.0)
Hemoglobin: 14.7 g/dL (ref 12.0–15.0)
MCH: 28.5 pg (ref 26.0–34.0)
MCHC: 33 g/dL (ref 30.0–36.0)
MCV: 86.4 fL (ref 80.0–100.0)
Platelets: 174 10*3/uL (ref 150–400)
RBC: 5.15 MIL/uL — ABNORMAL HIGH (ref 3.87–5.11)
RDW: 15.2 % (ref 11.5–15.5)
WBC: 6.4 10*3/uL (ref 4.0–10.5)
nRBC: 0 % (ref 0.0–0.2)

## 2022-12-07 LAB — TROPONIN I (HIGH SENSITIVITY): Troponin I (High Sensitivity): 30 ng/L — ABNORMAL HIGH (ref <18)

## 2022-12-07 MED ORDER — ORAL CARE MOUTH RINSE: 15.0000 mL | OROMUCOSAL | Status: DC | PRN

## 2022-12-07 MED ORDER — HYDRALAZINE HCL 50 MG PO TABS
50.0000 mg | ORAL_TABLET | Freq: Three times a day (TID) | ORAL | Status: DC
  Administered 2022-12-07 – 2022-12-09 (×6): 50 mg via ORAL
  Filled 2022-12-07 (×6): qty 1

## 2022-12-07 MED ORDER — ASPIRIN 81 MG PO TBEC
81.0000 mg | DELAYED_RELEASE_TABLET | Freq: Every day | ORAL | Status: DC
  Administered 2022-12-08 – 2022-12-09 (×2): 81 mg via ORAL
  Filled 2022-12-07 (×2): qty 1

## 2022-12-07 MED ORDER — PANTOPRAZOLE SODIUM 40 MG PO TBEC
40.0000 mg | DELAYED_RELEASE_TABLET | Freq: Two times a day (BID) | ORAL | Status: DC
  Administered 2022-12-07 – 2022-12-09 (×4): 40 mg via ORAL
  Filled 2022-12-07 (×4): qty 1

## 2022-12-07 MED ORDER — SUCRALFATE 1 GM/10ML PO SUSP: 1.0000 g | Freq: Three times a day (TID) | ORAL | Status: DC

## 2022-12-07 MED ORDER — CALCIUM CARBONATE ANTACID 500 MG PO CHEW
400.0000 mg | CHEWABLE_TABLET | Freq: Once | ORAL | Status: AC
  Administered 2022-12-07: 400 mg via ORAL
  Filled 2022-12-07: qty 2

## 2022-12-07 MED ORDER — SUCRALFATE 1 GM/10ML PO SUSP
1.0000 g | Freq: Three times a day (TID) | ORAL | Status: DC
  Administered 2022-12-07 – 2022-12-09 (×7): 1 g via ORAL
  Filled 2022-12-07 (×7): qty 10

## 2022-12-07 MED ORDER — METHOCARBAMOL 500 MG PO TABS: 500.0000 mg | ORAL_TABLET | Freq: Three times a day (TID) | ORAL | Status: DC | PRN

## 2022-12-07 MED ORDER — MORPHINE SULFATE (PF) 2 MG/ML IV SOLN
2.0000 mg | INTRAVENOUS | Status: AC
  Administered 2022-12-07: 2 mg via INTRAVENOUS
  Filled 2022-12-07: qty 1

## 2022-12-07 MED ORDER — CHLORTHALIDONE 25 MG PO TABS
25.0000 mg | ORAL_TABLET | Freq: Every day | ORAL | Status: DC
  Administered 2022-12-08 – 2022-12-09 (×2): 25 mg via ORAL
  Filled 2022-12-07 (×2): qty 1

## 2022-12-07 NOTE — Progress Notes (Signed)
Progress Note   Patient: Brittany [name] [id number] DOB: 1965/12/21 DOA: 12/05/2022     1 DOS: the patient was seen and examined on 12/07/2022   Brief hospital course: [name] was admitted to the hospital with the working diagnosis of uncontrolled hypertension.   57 yo female with the past medical history of chronic osteoarthritis, hypertension, CVA and peripheral neuropathy who presented with chest pain and dyspnea. Patient has not been compliant with her antihypertensive medications. At home she had dyspnea and palpitations on exertion, positive chest discomfort, prompting her to come to the ED. On her initial physical examination her blood pressure was 150/113, HR 87, RR 18 and 02 saturation 99% lungs with no wheezing or rales, heart with S1 and S2 present and regular, tachycardic, abdomen with no distention and no lower extremity edema.   Na 140, K 3,2 Cl 103, bicarbonate 23, glucose 106 bun 9 cr 1,0  High sensitive troponin 41 and 49  Wbc 6,1 hgb 16,4 plt 198   Chest radiograph with mild cardiomegaly with no infiltrates or effusions.   EKG 117 bpm, normal axis, normal intervals, sinus rhythm with no significant ST segment or T wave changes.   Assessment and Plan: * Hypertensive emergency Systolic blood pressure has been regimen of 159 to 226, diastolic 86 to 110 mmHg.  Plan to continue medical therapy with amlodipine and losartan and carvedilol. Change HZTC for chlorthalidone and add hydralazine 50 mg po bid.  Will need close follow up as outpatient for difficult to control hypertension.    AKI (acute kidney injury) (HCC) Hypokalemia.     Hypokalemia Renal function with serum cr at 1,1 with K at 3,8 and serum bicarbonate at 23. Na 137  Plan to hold on loop diuretic therapy and exchange hydrochlorothiazide for chlorthalidone for better blood pressure control.   GERD (gastroesophageal reflux disease) Continue with PPI and will add sucralfate due to dyspepsia.    History of CVA (cerebrovascular accident) Continue blood pressure control.   Chronic osteoarthritis Out of bed as tolerated.   IBS (irritable bowel syndrome) No signs of acute exacerbation   Class 1 obesity Calculated BMI is 30,04        Subjective: Patient with no chest pain or dyspnea, had epigastric abdominal pain today with dyspepsia   Physical Exam: Vitals:   12/06/22 2253 12/07/22 0342 12/07/22 0915 12/07/22 1209  BP: (!) 151/66 (!) 159/86 (!) 226/110 (!) 154/88  Pulse: 69 76  92  Resp: 20 20  18   Temp: 97.6 F (36.4 C) 97.9 F (36.6 C)  98.3 F (36.8 C)  TempSrc: Oral Oral  Oral  SpO2: 99% 97%  100%  Weight:  71.9 kg     Neurology awake and alert ENT with no pallor or icterus Cardiovascular with S1 and S2 present and regular with no gallops, rubs or murmurs Respiratory with no rales or wheezing, no rhonchi Abdomen with no distention, non tender and not distended No lower extremity edema  Data Reviewed:    Family Communication: no family at the bedside   Disposition: Status is: Inpatient Remains inpatient appropriate because: uncontrolled hypertension   Planned Discharge Destination: Home      Author: Coralie Keens, MD 12/07/2022 2:50 PM  For on call review [email or web address].

## 2022-12-07 NOTE — TOC Initial Note (Signed)
Transition of Care [facility]) - Initial/Assessment Note    Patient Details  Name: Brittany [name] MRN: [id number] Date of Birth: 06/27/1965  [facility] CM/SW Contact:    Kermit Balo, RN Phone Number: 12/07/2022, 3:40 PM  Clinical Narrative:                 Patient is from home alone. She states she is struggling with home activities. CM has filled out and faxed in [facility] form to [facility]: [phone]. Pt drives some but her mother also assists. CM provided [facility] transportation information to AVS. CM has added food resources to her AVS. She states her food stamps have been cut and she sometimes has issues affording meals. Pt states her mother will provide transport home when discharged.  TOC following.  Expected Discharge Plan: Home/Self Care Barriers to Discharge: Continued Medical Work up   Patient Goals and CMS Choice            Expected Discharge Plan and Services   Discharge Planning Services: CM Consult   Living arrangements for the past 2 months: Single Family Home                                      Prior Living Arrangements/Services Living arrangements for the past 2 months: Single Family Home Lives with:: Self Patient language and need for interpreter reviewed:: Yes Do you feel safe going back to the place where you live?: Yes        Care giver support system in place?: No (comment) Current home services: DME (rollator/ shower seat/ leg brace) Criminal Activity/Legal Involvement Pertinent to Current Situation/Hospitalization: No - Comment as needed  Activities of Daily Living      Permission Sought/Granted                  Emotional Assessment Appearance:: Appears stated age Attitude/Demeanor/Rapport: Engaged Affect (typically observed): Accepting Orientation: : Oriented to Self, Oriented to Place, Oriented to  Time, Oriented to Situation   Psych Involvement: No (comment)  Admission diagnosis:  Hypertensive emergency  [I16.1] Chest pain with moderate risk for cardiac etiology [R07.9] Patient Active Problem List   Diagnosis Date Noted   GERD (gastroesophageal reflux disease) 12/06/2022   Chronic osteoarthritis 12/06/2022   AKI (acute kidney injury) (HCC) 12/06/2022   Muscle spasm of both lower legs 12/06/2022   History of CVA (cerebrovascular accident) 12/06/2022   Class 1 obesity 12/06/2022   Hypertensive emergency 12/05/2022   Elevated troponin 12/05/2022   Aortic atherosclerosis (HCC) 07/10/2022   IBS (irritable bowel syndrome) 12/01/2021   Ganglion cyst of volar aspect of left wrist 05/20/2020   Trigger thumb of left hand 05/20/2020   Labral tear of shoulder, degenerative, right 05/20/2020   Anxiety 02/05/2019   Constipation 10/08/2018   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   B12 deficiency 12/19/2016   Cerebellar infarct (HCC)    Diastolic dysfunction    Marijuana abuse    Tachycardia    Hypokalemia    Stroke (cerebrum) (HCC) 12/05/2016   Occipital stroke (HCC) 12/05/2016   Hypertensive urgency 12/05/2016   Essential hypertension    PCP:  Clayborne Dana, NP Pharmacy:   [facility] - [place], [place] - [address] [place] [address] Phone: [phone] Fax: [phone]  [facility] - [place], [place] - [address]  [place] [address] Phone: [phone] Fax: [phone]  [facility] [address] [place] [address] Phone: [phone] Fax: [phone]     Social Determinants of Health (SDOH) Social History: SDOH Screenings   Food Insecurity: Food Insecurity Present (12/08/2020)  Housing: Low Risk  (02/22/2021)  Transportation Needs: No Transportation Needs (12/08/2020)  Alcohol Screen: Low Risk  (02/22/2021)  Depression (PHQ2-9): Low Risk  (01/06/2021)  Tobacco Use: Medium Risk (12/06/2022)   SDOH Interventions:      Readmission Risk Interventions     No data to display

## 2022-12-07 NOTE — Progress Notes (Signed)
  Echocardiogram 2D Echocardiogram has been performed.  Maren Reamer 12/07/2022, 11:20 AM

## 2022-12-08 DIAGNOSIS — M1990 Unspecified osteoarthritis, unspecified site: Secondary | ICD-10-CM | POA: Diagnosis not present

## 2022-12-08 DIAGNOSIS — K589 Irritable bowel syndrome without diarrhea: Secondary | ICD-10-CM | POA: Diagnosis not present

## 2022-12-08 DIAGNOSIS — E876 Hypokalemia: Secondary | ICD-10-CM | POA: Diagnosis not present

## 2022-12-08 DIAGNOSIS — I161 Hypertensive emergency: Secondary | ICD-10-CM | POA: Diagnosis not present

## 2022-12-08 DIAGNOSIS — K2100 Gastro-esophageal reflux disease with esophagitis, without bleeding: Secondary | ICD-10-CM | POA: Diagnosis not present

## 2022-12-08 DIAGNOSIS — E669 Obesity, unspecified: Secondary | ICD-10-CM | POA: Diagnosis not present

## 2022-12-08 DIAGNOSIS — Z8673 Personal history of transient ischemic attack (TIA), and cerebral infarction without residual deficits: Secondary | ICD-10-CM | POA: Diagnosis not present

## 2022-12-08 LAB — BASIC METABOLIC PANEL
Anion gap: 10 (ref 5–15)
BUN: 19 mg/dL (ref 6–20)
CO2: 24 mmol/L (ref 22–32)
Calcium: 9.4 mg/dL (ref 8.9–10.3)
Chloride: 104 mmol/L (ref 98–111)
Creatinine, Ser: 1.34 mg/dL — ABNORMAL HIGH (ref 0.44–1.00)
GFR, Estimated: 46 mL/min — ABNORMAL LOW (ref >60)
Glucose, Bld: 110 mg/dL — ABNORMAL HIGH (ref 70–99)
Potassium: 3.8 mmol/L (ref 3.5–5.1)
Sodium: 138 mmol/L (ref 135–145)

## 2022-12-08 NOTE — Progress Notes (Signed)
Progress Note   Patient: Brittany [name] EAV:[id number] DOB: [birth year] DOA: 12/05/2022     2 DOS: the patient was seen and examined on 12/08/2022   Brief hospital course: Mrs. Brittany [name] was admitted to the hospital with the working diagnosis of uncontrolled hypertension.   57 yo female with the past medical history of chronic osteoarthritis, hypertension, CVA and peripheral neuropathy who presented with chest pain and dyspnea. Patient has not been compliant with her antihypertensive medications. At home she had dyspnea and palpitations on exertion, positive chest discomfort, prompting her to come to the ED. On her initial physical examination her blood pressure was 150/113, HR 87, RR 18 and 02 saturation 99% lungs with no wheezing or rales, heart with S1 and S2 present and regular, tachycardic, abdomen with no distention and no lower extremity edema.   Na 140, K 3,2 Cl 103, bicarbonate 23, glucose 106 bun 9 cr 1,0  High sensitive troponin 41 and 49  Wbc 6,1 hgb 16,4 plt 198   Chest radiograph with mild cardiomegaly with no infiltrates or effusions.   EKG 117 bpm, normal axis, normal intervals, sinus rhythm with no significant ST segment or T wave changes.   09/20 Blood pressure has improved, pending renal function to be more stable prior to discharge home.   Assessment and Plan: * Hypertensive emergency Resistant hypertension.  Patient with no chest pain or dyspnea, blood pressure has improved with systolic 114 to 154 mmHg.   Plan to continue medical therapy with amlodipine hydralazine, losartan, chlorthalidone and carvedilol.  Echocardiogram with preserved LV systolic function 70 to 75% EF, severe LVH, RV systolic function preserved, no significant valvular disease. RVSP 18,8 mmHg.   AKI (acute kidney injury) (HCC) Hypokalemia.     Hypokalemia Renal function with serum cr at 1,3 with K at 3,8 and serum bicarbonate at 24. Na 138   Continue to hold on loop diuretic, continue with  thiazide and ARB for blood pressure control.  Follow up renal function in am.    GERD (gastroesophageal reflux disease) Continue with PPI and will add sucralfate due to dyspepsia.   History of CVA (cerebrovascular accident) Continue blood pressure control.   Chronic osteoarthritis Out of bed as tolerated.   IBS (irritable bowel syndrome) No signs of acute exacerbation   Class 1 obesity Calculated BMI is 30,04        Subjective: Patient with no chest pain, no dyspnea, has been out of bed and ambulating in the hallway   Physical Exam: Vitals:   12/08/22 0021 12/08/22 0311 12/08/22 0754 12/08/22 1213  BP: 138/70 (!) 114/55 (!) 154/70 (!) 142/71  Pulse: 87 88 93 72  Resp: 20 20 20 20   Temp: 98.1 F (36.7 C) 98.5 F (36.9 C) 98.1 F (36.7 C) 98.5 F (36.9 C)  TempSrc: Oral Oral Oral Oral  SpO2: 100% 91%  100%  Weight:  72.3 kg     Neurology awake and alert ENT with mild pallor Cardiovascular with S1 and S2 present and regular with no gallops, rubs or murmurs Respiratory with no rales or wheezing, no rhonchi Abdomen with no distention  No lower extremity edema  Data Reviewed:    Family Communication: no family at the bedside   Disposition: Status is: Inpatient Remains inpatient appropriate because: rising serum cr   Planned Discharge Destination: Home     Author: Coralie Keens, MD 12/08/2022 1:17 PM  For on call review [email or web address].

## 2022-12-08 NOTE — Progress Notes (Signed)
Mobility Specialist Progress Note:   12/08/22 1100  Mobility  Activity Ambulated with assistance in hallway  Level of Assistance Standby assist, set-up cues, supervision of patient - no hands on  Assistive Device Four wheel walker  Distance Ambulated (ft) 470 ft  Activity Response Tolerated well  Mobility Referral Yes  $Mobility charge 1 Mobility  Mobility Specialist Start Time (ACUTE ONLY) 1040  Mobility Specialist Stop Time (ACUTE ONLY) 1052  Mobility Specialist Time Calculation (min) (ACUTE ONLY) 12 min   Pre Mobility: 97 HR , 146/75 BP , 92% SpO2 During Mobility: 105 HR ,  95% SpO2 Post Mobility: 92 HR , 98% SpO2  Pt received in bed, agreeable to mobility. Stated that she feels "really good today". Denied any pain or SOB during ambulation, asymptomatic throughout. Pt returned to bed with call bell in reach and all needs met.   Leory Plowman  Mobility Specialist Please contact via [employer] office at [phone]

## 2022-12-09 DIAGNOSIS — K2100 Gastro-esophageal reflux disease with esophagitis, without bleeding: Secondary | ICD-10-CM | POA: Diagnosis not present

## 2022-12-09 DIAGNOSIS — I161 Hypertensive emergency: Secondary | ICD-10-CM | POA: Diagnosis not present

## 2022-12-09 DIAGNOSIS — Z8673 Personal history of transient ischemic attack (TIA), and cerebral infarction without residual deficits: Secondary | ICD-10-CM | POA: Diagnosis not present

## 2022-12-09 DIAGNOSIS — M1990 Unspecified osteoarthritis, unspecified site: Secondary | ICD-10-CM | POA: Diagnosis not present

## 2022-12-09 DIAGNOSIS — K589 Irritable bowel syndrome without diarrhea: Secondary | ICD-10-CM | POA: Diagnosis not present

## 2022-12-09 DIAGNOSIS — E669 Obesity, unspecified: Secondary | ICD-10-CM | POA: Diagnosis not present

## 2022-12-09 DIAGNOSIS — E876 Hypokalemia: Secondary | ICD-10-CM | POA: Diagnosis not present

## 2022-12-09 LAB — RENAL FUNCTION PANEL
Albumin: 3.4 g/dL — ABNORMAL LOW (ref 3.5–5.0)
Anion gap: 10 (ref 5–15)
BUN: 18 mg/dL (ref 6–20)
CO2: 23 mmol/L (ref 22–32)
Calcium: 9.1 mg/dL (ref 8.9–10.3)
Chloride: 106 mmol/L (ref 98–111)
Creatinine, Ser: 1.06 mg/dL — ABNORMAL HIGH (ref 0.44–1.00)
GFR, Estimated: >60 mL/min (ref >60)
Glucose, Bld: 113 mg/dL — ABNORMAL HIGH (ref 70–99)
Phosphorus: 4 mg/dL (ref 2.5–4.6)
Potassium: 3.8 mmol/L (ref 3.5–5.1)
Sodium: 139 mmol/L (ref 135–145)

## 2022-12-09 MED ORDER — AMLODIPINE BESYLATE 10 MG PO TABS: 10.0000 mg | ORAL_TABLET | Freq: Every day | ORAL | 0 refills | Status: DC

## 2022-12-09 MED ORDER — LOSARTAN POTASSIUM 25 MG PO TABS: 25.0000 mg | ORAL_TABLET | Freq: Every day | ORAL | 0 refills | Status: DC

## 2022-12-09 MED ORDER — CHLORTHALIDONE 25 MG PO TABS: 25.0000 mg | ORAL_TABLET | Freq: Every day | ORAL | 0 refills | Status: DC

## 2022-12-09 MED ORDER — METHOCARBAMOL 500 MG PO TABS: 500.0000 mg | ORAL_TABLET | Freq: Three times a day (TID) | ORAL | Status: DC | PRN

## 2022-12-09 MED ORDER — HYDRALAZINE HCL 50 MG PO TABS: 50.0000 mg | ORAL_TABLET | Freq: Three times a day (TID) | ORAL | 0 refills | Status: AC

## 2022-12-09 MED ORDER — POTASSIUM CHLORIDE CRYS ER 20 MEQ PO TBCR: 40.0000 meq | EXTENDED_RELEASE_TABLET | Freq: Once | ORAL | Status: DC

## 2022-12-09 MED ORDER — BISACODYL 5 MG PO TBEC: 5.0000 mg | DELAYED_RELEASE_TABLET | Freq: Every day | ORAL | Status: DC | PRN

## 2022-12-09 NOTE — Plan of Care (Signed)
  Problem: Clinical Measurements: Goal: Ability to maintain clinical measurements within normal limits will improve Outcome: Progressing Goal: Will remain free from infection Outcome: Progressing Goal: Diagnostic test results will improve Outcome: Progressing Goal: Cardiovascular complication will be avoided Outcome: Progressing   Problem: Activity: Goal: Risk for activity intolerance will decrease Outcome: Progressing   Problem: Coping: Goal: Level of anxiety will decrease Outcome: Progressing   Problem: Pain Managment: Goal: General experience of comfort will improve Outcome: Progressing   Problem: Safety: Goal: Ability to remain free from injury will improve Outcome: Progressing

## 2022-12-09 NOTE — Discharge Summary (Addendum)
tablet (500 mg total) by mouth every 8 (eight) hours as needed for muscle spasms. What changed:  when to take this reasons to take this   omeprazole 20 MG capsule Commonly known as: PRILOSEC Take 1 capsule (20 mg total) by mouth 2 (two) times daily before a meal.   triamcinolone cream 0.1 % Commonly known as: KENALOG APPLY 1 APPLICATION TOPICALLY 2 (TWO) TIMES DAILY. What changed:  how much to take when to take this reasons to take this   Vitamin D3 10 MCG (400 UNIT) tablet Take 800 Units by mouth daily.        Follow-up Information     [facility] medicaid transportation Follow up.   Why: You will need to call 2-3 days in advance to schedule transportation. Contact information: [phone]               Discharge Exam: Filed Weights   12/07/22 0342 12/08/22 0311 12/09/22 0256  Weight: 71.9 kg 72.3 kg 72.9 kg   BP (!) 142/68 (BP Location: Right Arm)   Pulse 85   Temp 98 F (36.7 C) (Oral)   Resp 18   Wt 72.9 kg   SpO2 96%   BMI 30.36  kg/m   Patient is feeling better, no chest pain and no dyspnea.   Neurology awake and alert ENT with no pallor Cardiovascular with S1 and S2 present and regular with no gallops, rubs or murmurs Respiratory with no rales or wheezing, no rhonchi Abdomen with no distention  No lower extremity edema   Condition at discharge: stable  The results of significant diagnostics from this hospitalization (including imaging, microbiology, ancillary and laboratory) are listed below for reference.   Imaging Studies: ECHOCARDIOGRAM COMPLETE  Result Date: 12/07/2022    ECHOCARDIOGRAM REPORT   Patient Name:   Brittany [name] Date of Exam: 12/07/2022 Medical Rec #:  [id number]       Height:       61.0 in Accession #:    [id number]      Weight:       158.6 lb Date of Birth:  Jul 05, 1965       BSA:          1.711 m Patient Age:    57 years        BP:           226/110 mmHg Patient Gender: F               HR:           93 bpm. Exam Location:  Inpatient Procedure: 2D Echo, Color Doppler and Cardiac Doppler Indications:    NSTEMI I21.4  History:        Patient has prior history of Echocardiogram examinations, most                 recent 12/06/2016. NSTEMI, Stroke, Signs/Symptoms:Murmur; Risk                 Factors:Hypertension, Former Smoker and Dyslipidemia.  Sonographer:    Aron Baba Referring Phys: [id number] SUBRINA SUNDIL  Sonographer Comments: Image acquisition challenging due to respiratory motion and Image acquisition challenging due to patient body habitus. IMPRESSIONS  1. Maximall septal thickness 22 mm. Peak intracavitary gradient 12 mm Hg. Left ventricular ejection fraction, by estimation, is 70 to 75%. The left ventricle has hyperdynamic function. The left ventricle has no regional wall motion abnormalities. There is severe concentric left ventricular hypertrophy. Left ventricular diastolic parameters are consistent with Grade I diastolic  Physician Discharge Summary   Patient: Brittany [name] MRN: [id number] DOB: 1966/03/07  Admit date:     12/05/2022  Discharge date: 12/09/22  Discharge Physician: York Ram Clovis Warwick   PCP: Clayborne Dana, NP   Recommendations at discharge:    Patient with difficult to control hypertension, now on 4 different agents, losartan, chlorthalidone, amlodipine and hydralazine.  As outpatient can further adjust regimen with increasing dose of ARB and weaning off hydralazine to simplify regimen.  Follow up renal function and electrolytes as outpatient in 7 days, may need K supplements.  Follow up with Hyman Hopes NP in 7 to 10 days. Follow up with hypertension clinic.    Discharge Diagnoses: Principal Problem:   Hypertensive emergency Active Problems:   Hypokalemia   GERD (gastroesophageal reflux disease)   History of CVA (cerebrovascular accident)   IBS (irritable bowel syndrome)   Chronic osteoarthritis   Class 1 obesity  Resolved Problems:   * No resolved hospital problems. [facility] Course: Brittany [name] was admitted to the hospital with the working diagnosis of uncontrolled hypertension.   57 yo female with the past medical history of chronic osteoarthritis, hypertension, CVA and peripheral neuropathy who presented with chest pain and dyspnea. Patient has not been compliant with her antihypertensive medications. At home she had dyspnea and palpitations on exertion, positive chest discomfort, prompting her to come to the ED. On her initial physical examination her blood pressure was 150/113, HR 87, RR 18 and 02 saturation 99% lungs with no wheezing or rales, heart with S1 and S2 present and regular, tachycardic, abdomen with no distention and no lower extremity edema.   Na 140, K 3,2 Cl 103, bicarbonate 23, glucose 106 bun 9 cr 1,0  High sensitive troponin 41 and 49  Wbc 6,1 hgb 16,4 plt 198   Chest radiograph with mild cardiomegaly with no infiltrates or effusions.   EKG  117 bpm, normal axis, normal intervals, sinus rhythm with no significant ST segment or T wave changes.   09/20 Blood pressure has improved, pending renal function to be more stable prior to discharge home.  09/21 renal function has improved, blood pressure with better control, she will follow up as outpatient for further adjustments in her blood pressure regimen.   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   Patient was placed on oral antihypertensive medications, with improvement in her symptoms.   Plan to continue medical therapy with amlodipine hydralazine, losartan, and chlorthalidone. Will hold on carvedilol to simplify medical regimen.  As outpatient can further increase dose of losartan and wean off hydralazine.   Echocardiogram with preserved LV systolic function 70 to 75% EF, severe LVH, RV systolic function preserved, no significant valvular disease. RVSP 18,8 mmHg. No wall motion abnormalities.   Troponin elevation due to uncontrolled hypertension, she ruled out for acute coronary syndrome.   Hypokalemia AKI.   Renal function with serum cr at 1,0 with K at 3,8 and serum bicarbonate at 23.  Na 139. P 4.0   Plan to follow up renal function as outpatient. Will give 40 meq Kcl prior to her discharge. She may need outpatient K supplementation depending on follow up renal panel.  Follow up renal function in am.    GERD (gastroesophageal reflux disease) Patient was placed on pantoprazole and sucralfate for dyspepsia with improvement in her symptoms.  At the time of her discharge she will continue with proton pump inhibitors.   History  Physician Discharge Summary   Patient: Brittany [name] MRN: [id number] DOB: 1966/03/07  Admit date:     12/05/2022  Discharge date: 12/09/22  Discharge Physician: York Ram Clovis Warwick   PCP: Clayborne Dana, NP   Recommendations at discharge:    Patient with difficult to control hypertension, now on 4 different agents, losartan, chlorthalidone, amlodipine and hydralazine.  As outpatient can further adjust regimen with increasing dose of ARB and weaning off hydralazine to simplify regimen.  Follow up renal function and electrolytes as outpatient in 7 days, may need K supplements.  Follow up with Hyman Hopes NP in 7 to 10 days. Follow up with hypertension clinic.    Discharge Diagnoses: Principal Problem:   Hypertensive emergency Active Problems:   Hypokalemia   GERD (gastroesophageal reflux disease)   History of CVA (cerebrovascular accident)   IBS (irritable bowel syndrome)   Chronic osteoarthritis   Class 1 obesity  Resolved Problems:   * No resolved hospital problems. [facility] Course: Brittany [name] was admitted to the hospital with the working diagnosis of uncontrolled hypertension.   57 yo female with the past medical history of chronic osteoarthritis, hypertension, CVA and peripheral neuropathy who presented with chest pain and dyspnea. Patient has not been compliant with her antihypertensive medications. At home she had dyspnea and palpitations on exertion, positive chest discomfort, prompting her to come to the ED. On her initial physical examination her blood pressure was 150/113, HR 87, RR 18 and 02 saturation 99% lungs with no wheezing or rales, heart with S1 and S2 present and regular, tachycardic, abdomen with no distention and no lower extremity edema.   Na 140, K 3,2 Cl 103, bicarbonate 23, glucose 106 bun 9 cr 1,0  High sensitive troponin 41 and 49  Wbc 6,1 hgb 16,4 plt 198   Chest radiograph with mild cardiomegaly with no infiltrates or effusions.   EKG  117 bpm, normal axis, normal intervals, sinus rhythm with no significant ST segment or T wave changes.   09/20 Blood pressure has improved, pending renal function to be more stable prior to discharge home.  09/21 renal function has improved, blood pressure with better control, she will follow up as outpatient for further adjustments in her blood pressure regimen.   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   Patient was placed on oral antihypertensive medications, with improvement in her symptoms.   Plan to continue medical therapy with amlodipine hydralazine, losartan, and chlorthalidone. Will hold on carvedilol to simplify medical regimen.  As outpatient can further increase dose of losartan and wean off hydralazine.   Echocardiogram with preserved LV systolic function 70 to 75% EF, severe LVH, RV systolic function preserved, no significant valvular disease. RVSP 18,8 mmHg. No wall motion abnormalities.   Troponin elevation due to uncontrolled hypertension, she ruled out for acute coronary syndrome.   Hypokalemia AKI.   Renal function with serum cr at 1,0 with K at 3,8 and serum bicarbonate at 23.  Na 139. P 4.0   Plan to follow up renal function as outpatient. Will give 40 meq Kcl prior to her discharge. She may need outpatient K supplementation depending on follow up renal panel.  Follow up renal function in am.    GERD (gastroesophageal reflux disease) Patient was placed on pantoprazole and sucralfate for dyspepsia with improvement in her symptoms.  At the time of her discharge she will continue with proton pump inhibitors.   History  tablet (500 mg total) by mouth every 8 (eight) hours as needed for muscle spasms. What changed:  when to take this reasons to take this   omeprazole 20 MG capsule Commonly known as: PRILOSEC Take 1 capsule (20 mg total) by mouth 2 (two) times daily before a meal.   triamcinolone cream 0.1 % Commonly known as: KENALOG APPLY 1 APPLICATION TOPICALLY 2 (TWO) TIMES DAILY. What changed:  how much to take when to take this reasons to take this   Vitamin D3 10 MCG (400 UNIT) tablet Take 800 Units by mouth daily.        Follow-up Information     [facility] medicaid transportation Follow up.   Why: You will need to call 2-3 days in advance to schedule transportation. Contact information: [phone]               Discharge Exam: Filed Weights   12/07/22 0342 12/08/22 0311 12/09/22 0256  Weight: 71.9 kg 72.3 kg 72.9 kg   BP (!) 142/68 (BP Location: Right Arm)   Pulse 85   Temp 98 F (36.7 C) (Oral)   Resp 18   Wt 72.9 kg   SpO2 96%   BMI 30.36  kg/m   Patient is feeling better, no chest pain and no dyspnea.   Neurology awake and alert ENT with no pallor Cardiovascular with S1 and S2 present and regular with no gallops, rubs or murmurs Respiratory with no rales or wheezing, no rhonchi Abdomen with no distention  No lower extremity edema   Condition at discharge: stable  The results of significant diagnostics from this hospitalization (including imaging, microbiology, ancillary and laboratory) are listed below for reference.   Imaging Studies: ECHOCARDIOGRAM COMPLETE  Result Date: 12/07/2022    ECHOCARDIOGRAM REPORT   Patient Name:   Brittany [name] Date of Exam: 12/07/2022 Medical Rec #:  [id number]       Height:       61.0 in Accession #:    [id number]      Weight:       158.6 lb Date of Birth:  Jul 05, 1965       BSA:          1.711 m Patient Age:    57 years        BP:           226/110 mmHg Patient Gender: F               HR:           93 bpm. Exam Location:  Inpatient Procedure: 2D Echo, Color Doppler and Cardiac Doppler Indications:    NSTEMI I21.4  History:        Patient has prior history of Echocardiogram examinations, most                 recent 12/06/2016. NSTEMI, Stroke, Signs/Symptoms:Murmur; Risk                 Factors:Hypertension, Former Smoker and Dyslipidemia.  Sonographer:    Aron Baba Referring Phys: [id number] SUBRINA SUNDIL  Sonographer Comments: Image acquisition challenging due to respiratory motion and Image acquisition challenging due to patient body habitus. IMPRESSIONS  1. Maximall septal thickness 22 mm. Peak intracavitary gradient 12 mm Hg. Left ventricular ejection fraction, by estimation, is 70 to 75%. The left ventricle has hyperdynamic function. The left ventricle has no regional wall motion abnormalities. There is severe concentric left ventricular hypertrophy. Left ventricular diastolic parameters are consistent with Grade I diastolic  Physician Discharge Summary   Patient: Brittany [name] MRN: [id number] DOB: 1966/03/07  Admit date:     12/05/2022  Discharge date: 12/09/22  Discharge Physician: York Ram Clovis Warwick   PCP: Clayborne Dana, NP   Recommendations at discharge:    Patient with difficult to control hypertension, now on 4 different agents, losartan, chlorthalidone, amlodipine and hydralazine.  As outpatient can further adjust regimen with increasing dose of ARB and weaning off hydralazine to simplify regimen.  Follow up renal function and electrolytes as outpatient in 7 days, may need K supplements.  Follow up with Hyman Hopes NP in 7 to 10 days. Follow up with hypertension clinic.    Discharge Diagnoses: Principal Problem:   Hypertensive emergency Active Problems:   Hypokalemia   GERD (gastroesophageal reflux disease)   History of CVA (cerebrovascular accident)   IBS (irritable bowel syndrome)   Chronic osteoarthritis   Class 1 obesity  Resolved Problems:   * No resolved hospital problems. [facility] Course: Brittany [name] was admitted to the hospital with the working diagnosis of uncontrolled hypertension.   57 yo female with the past medical history of chronic osteoarthritis, hypertension, CVA and peripheral neuropathy who presented with chest pain and dyspnea. Patient has not been compliant with her antihypertensive medications. At home she had dyspnea and palpitations on exertion, positive chest discomfort, prompting her to come to the ED. On her initial physical examination her blood pressure was 150/113, HR 87, RR 18 and 02 saturation 99% lungs with no wheezing or rales, heart with S1 and S2 present and regular, tachycardic, abdomen with no distention and no lower extremity edema.   Na 140, K 3,2 Cl 103, bicarbonate 23, glucose 106 bun 9 cr 1,0  High sensitive troponin 41 and 49  Wbc 6,1 hgb 16,4 plt 198   Chest radiograph with mild cardiomegaly with no infiltrates or effusions.   EKG  117 bpm, normal axis, normal intervals, sinus rhythm with no significant ST segment or T wave changes.   09/20 Blood pressure has improved, pending renal function to be more stable prior to discharge home.  09/21 renal function has improved, blood pressure with better control, she will follow up as outpatient for further adjustments in her blood pressure regimen.   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   Patient was placed on oral antihypertensive medications, with improvement in her symptoms.   Plan to continue medical therapy with amlodipine hydralazine, losartan, and chlorthalidone. Will hold on carvedilol to simplify medical regimen.  As outpatient can further increase dose of losartan and wean off hydralazine.   Echocardiogram with preserved LV systolic function 70 to 75% EF, severe LVH, RV systolic function preserved, no significant valvular disease. RVSP 18,8 mmHg. No wall motion abnormalities.   Troponin elevation due to uncontrolled hypertension, she ruled out for acute coronary syndrome.   Hypokalemia AKI.   Renal function with serum cr at 1,0 with K at 3,8 and serum bicarbonate at 23.  Na 139. P 4.0   Plan to follow up renal function as outpatient. Will give 40 meq Kcl prior to her discharge. She may need outpatient K supplementation depending on follow up renal panel.  Follow up renal function in am.    GERD (gastroesophageal reflux disease) Patient was placed on pantoprazole and sucralfate for dyspepsia with improvement in her symptoms.  At the time of her discharge she will continue with proton pump inhibitors.   History

## 2022-12-09 NOTE — Progress Notes (Signed)
Mobility Specialist Progress Note:    12/09/22 1136  Mobility  Activity Ambulated with assistance in hallway  Level of Assistance Contact guard assist, steadying assist  Assistive Device Four wheel walker  Distance Ambulated (ft) 400 ft  Activity Response Tolerated well  Mobility Referral Yes  $Mobility charge 1 Mobility  Mobility Specialist Start Time (ACUTE ONLY) 1030  Mobility Specialist Stop Time (ACUTE ONLY) 1043  Mobility Specialist Time Calculation (min) (ACUTE ONLY) 13 min   Received pt in bed having no complaints and agreeable to mobility. C/o SOB and fatigue during session, otherwise no c/o. Returned to room w/o fault. Left in bed w/ call bell in reach and all needs met.   Pre Mobility BP 144/82 During Mobility SPO2 100% HR 101 Post Mobility BP 169/80   Thompson Grayer Mobility Specialist  Please contact vis Secure Chat or  Rehab Office [phone]

## 2022-12-11 ENCOUNTER — Telehealth: Payer: Self-pay

## 2022-12-11 NOTE — Transitions of Care (Post Inpatient/ED Visit) (Signed)
12/11/2022  Name: Brittany [name] MRN: [id number] DOB: Nov 05, 1965  Today's TOC FU Call Status: Today's TOC FU Call Status:: Successful TOC FU Call Completed TOC FU Call Complete Date: 12/11/22 Patient's Name and Date of Birth confirmed.  Transition Care Management Follow-up Telephone Call Date of Discharge: 12/09/22 Discharge Facility: [facility]) Type of Discharge: Inpatient Admission Primary Inpatient Discharge Diagnosis:: Hypertensve emergency How have you been since you were released from the hospital?: Better Any questions or concerns?: No  Items Reviewed: Did you receive and understand the discharge instructions provided?: Yes Medications obtained,verified, and reconciled?: Yes (Medications Reviewed) Any new allergies since your discharge?: No Dietary orders reviewed?: Yes Type of Diet Ordered:: Low Sodium, Heart Healthy diet Do you have support at home?: Yes People in Home: parent(s) Name of Support/Comfort Primary Source: Sheliah Hatch Quick  Medications Reviewed Today: Medications Reviewed Today     Reviewed by Marcos Eke, RN (Registered Nurse) on 12/11/22 at 1625  Med List Status: <None>   Medication Order Taking? Sig Documenting Provider Last Dose Status Informant  albuterol (VENTOLIN HFA) 108 (90 Base) MCG/ACT inhaler [id number] No Inhale 2 puffs into the lungs every 4 (four) hours as needed for wheezing or shortness of breath. Ivonne Andrew, NP Unknown Active Self, Pharmacy Records  amLODipine (NORVASC) 10 MG tablet [id number] Yes Take 1 tablet (10 mg total) by mouth daily. Arrien, York Ram, MD Taking Active   atorvastatin (LIPITOR) 80 MG tablet [id number] No Take 1 tablet (80 mg total) by mouth daily. Clayborne Dana, NP Unknown Active Self, Pharmacy Records           Med Note [facility], Corinna Gab Dec 06, 2022  2:41 AM) May be taking PRN, received only a #30 ds, should be out.   chlorthalidone (HYGROTON) 25 MG tablet [id number] Yes  Take 1 tablet (25 mg total) by mouth daily. Arrien, York Ram, MD Taking Active   Cholecalciferol (VITAMIN D3) 10 MCG (400 UNIT) tablet [id number] No Take 800 Units by mouth daily. [provider] Unknown Active Self, Pharmacy Records  cyanocobalamin 2000 MCG tablet [id number] No Take 1 tablet (2,000 mcg total) by mouth daily. Clayborne Dana, NP Unknown Active Self, Pharmacy Records  gabapentin (NEURONTIN) 300 MG capsule [id number] Yes Take 1 capsule (300 mg total) by mouth 3 (three) times daily.  Patient taking differently: Take 300 mg by mouth daily as needed (for neuropathy).   Clayborne Dana, NP Taking Active Self, Pharmacy Records  hydrALAZINE (APRESOLINE) 50 MG tablet [id number] Yes Take 1 tablet (50 mg total) by mouth every 8 (eight) hours. Arrien, York Ram, MD Taking Active   hydrOXYzine (VISTARIL) 50 MG capsule [id number] No Take 50 mg by mouth daily as needed for itching. [provider] Unknown Active Self, Pharmacy Records  linaclotide [facility]) 72 MCG capsule [id number] Yes Take 1 capsule (72 mcg total) by mouth daily before breakfast.  Patient taking differently: Take 72 mcg by mouth daily as needed (for constipation).   Clayborne Dana, NP Taking Active Self, Pharmacy Records  losartan (COZAAR) 25 MG tablet [id number] Yes Take 1 tablet (25 mg total) by mouth daily. Arrien, York Ram, MD Taking Active   methocarbamol (ROBAXIN) 500 MG tablet [id number] No Take 1 tablet (500 mg total) by mouth every 8 (eight) hours as needed for muscle spasms. Arrien, York Ram, MD Unknown Active   omeprazole (PRILOSEC) 20 MG capsule [id number] No Take 1 capsule (20 mg total) by mouth 2 (two)  times daily before a meal. Ivonne Andrew, NP Unknown Active Self, Pharmacy Records  triamcinolone cream (KENALOG) 0.1 % [id number] Yes APPLY 1 APPLICATION TOPICALLY 2 (TWO) TIMES DAILY.  Patient taking differently: Apply 1 Application topically daily as needed (for dryness and  itching).   Ivonne Andrew, NP Taking Active Self, Pharmacy Records            Home Care and Equipment/Supplies: Were Home Health Services Ordered?: No Any new equipment or medical supplies ordered?: No  Functional Questionnaire: Do you need assistance with bathing/showering or dressing?: No Do you need assistance with eating?: No Do you have difficulty maintaining continence: No Do you need assistance with getting out of bed/getting out of a chair/moving?: No Do you have difficulty managing or taking your medications?: No  Follow up appointments reviewed: PCP Follow-up appointment confirmed?: Yes (Patient stated she was intent on calling her PCP, Hyman Hopes today to go over changes in her medications, discuss hospitalization) Follow-up Provider: Dorna Bloom, NP Specialist Hospital Follow-up appointment confirmed?: Yes Follow-Up Specialty Provider:: Planning to follow up at [facility] Clinic at [facility] Do you need transportation to your follow-up appointment?: Yes Transportation Need Intervention Addressed By:: Other: (Has [employer] Medicaid transp# to call 2-3 days ahead of when needed [phone]) Do you understand care options if your condition(s) worsen?: Yes-patient verbalized understanding  SDOH Interventions Today    Flowsheet Row Most Recent Value  SDOH Interventions   Transportation Interventions Other (Comment)  [Utilizes [facility] Medicaid Transportation for medical appts]       Alyse Low, RN, BA, [facility], CRRN [facility] Population Health Care Management Coordinator, Transition of Care Ph # [phone]

## 2022-12-12 ENCOUNTER — Other Ambulatory Visit: Payer: Medicaid Other | Admitting: Pharmacist

## 2022-12-12 NOTE — Progress Notes (Signed)
12/12/2022 Name: Liam Bahn MRN: [id number] DOB: [birth year]  Chief Complaint  Patient presents with   Hypertension   Medication Management    Miechelle Devereaux is a 57 y.o. year old female who presented for a telephone visit.   They were referred to the pharmacist by a quality report for assistance in managing hypertension and complex medication management.    Subjective:  Care Team: Primary Care Provider: Clayborne Dana, NP ; Next Scheduled Visit: not currently scheduled  Medication Access/Adherence  Current Pharmacy:  [facility] - [place], [place] - [address] [place] [address] Phone: [phone] Fax: [phone]   Patient reports affordability concerns with their medications: No  Patient reports access/transportation concerns to their pharmacy:  sometimes - she has driven in past but is meeting with case manager tomorrow to discuss use of transportation benefits  Patient reports adherence concerns with their medications:  No      Hypertension: Patient was recently hospitalize for 4 days for hypertensive emergency, CP and dyspnea. She had stopped all blood pressure medications because they were making her feel terrible.   Current medications: chlorthalidone 25mg  daily (started at hospital discharge - replaced HTCZ), losartan 25mg  daily (started at hospital discharge), hydralazine 50mg  every 8 hours (started at hospital discharge) and amlodipine 10mg  daily (started at hospital discharge - replaced diltiazem)   Medications previously tried: amlodipine - patient stopped on her own and she was also taking nifedipine; lisinopril - concerned initially for associated rash, but appears rash continued even after medication was discontinued   Patient has a validated, automated, upper arm home BP cuff Current blood pressure readings readings: SBP 137 to 147 and DBP 70's  Patient denies hypotensive s/sx including no  dizziness, lightheadedness. She reports that her legs feel weak since being home from hospital and sometimes feel numb. She denies edema.  Patient denies hypertensive symptoms including no headache, chest pain, shortness of breath   Hyperlipidemia/ASCVD Risk Reduction  Current lipid lowering medications:  atorvastatin 80mg  daily. She has not filled atorvastatin since June 17 when she filled for 30 day supply, Ms. Addis reports she had not been taking atorvastatin much so she has plenty on hand at this time.   Antiplatelet regimen: none - aspirin 325mg  stopped at hospital discharge 11/2022    Objective:  Lab Results  Component Value Date   HGBA1C 5.3 12/06/2022    Lab Results  Component Value Date   CREATININE 1.06 (H) 12/09/2022   BUN 18 12/09/2022   NA 139 12/09/2022   K 3.8 12/09/2022   CL 106 12/09/2022   CO2 23 12/09/2022    Lab Results  Component Value Date   CHOL 200 12/06/2022   HDL 28 (L) 12/06/2022   LDLCALC 134 (H) 12/06/2022   TRIG 190 (H) 12/06/2022   CHOLHDL 7.1 12/06/2022    Medications Reviewed Today     Reviewed by Henrene Pastor, RPH-CPP (Pharmacist) on 12/12/22 at 1614  Med List Status: <None>   Medication Order Taking? Sig Documenting Provider Last Dose Status Informant  albuterol (VENTOLIN HFA) 108 (90 Base) MCG/ACT inhaler [id number] Yes Inhale 2 puffs into the lungs every 4 (four) hours as needed for wheezing or shortness of breath. Ivonne Andrew, NP Taking Active Self, Pharmacy Records  amLODipine (NORVASC) 10 MG tablet [id number] Yes Take 1 tablet (10 mg total) by mouth daily. Arrien, York Ram, MD Taking Active   atorvastatin (LIPITOR) 80 MG tablet [id number] Yes Take 1  tablet (80 mg total) by mouth daily. Clayborne Dana, NP Taking Active Self, Pharmacy Records           Med Note [facility], Corinna Gab Dec 06, 2022  2:41 AM) May be taking PRN, received only a #30 ds, should be out.   chlorthalidone (HYGROTON) 25 MG tablet [id number] Yes  Take 1 tablet (25 mg total) by mouth daily. Arrien, York Ram, MD Taking Active   Cholecalciferol (VITAMIN D3) 10 MCG (400 UNIT) tablet [id number]  Take 800 Units by mouth daily. [provider]  Active Self, Pharmacy Records  cyanocobalamin 2000 MCG tablet [id number] No Take 1 tablet (2,000 mcg total) by mouth daily.  Patient not taking: Reported on 12/12/2022   Clayborne Dana, NP Not Taking Active Self, Pharmacy Records  gabapentin (NEURONTIN) 300 MG capsule [id number] Yes Take 1 capsule (300 mg total) by mouth 3 (three) times daily. Clayborne Dana, NP Taking Active Self, Pharmacy Records  hydrALAZINE (APRESOLINE) 50 MG tablet [id number] No Take 1 tablet (50 mg total) by mouth every 8 (eight) hours.  Patient not taking: Reported on 12/12/2022   Arrien, York Ram, MD Not Taking Active   hydrOXYzine (VISTARIL) 50 MG capsule [id number] No Take 50 mg by mouth daily as needed for itching.  Patient not taking: Reported on 12/12/2022   [provider] Not Taking Active Self, Pharmacy Records  linaclotide [facility]) 72 MCG capsule [id number] Yes Take 1 capsule (72 mcg total) by mouth daily before breakfast. Clayborne Dana, NP Taking Active Self, Pharmacy Records  losartan (COZAAR) 25 MG tablet [id number] Yes Take 1 tablet (25 mg total) by mouth daily. Arrien, York Ram, MD Taking Active   methocarbamol (ROBAXIN) 500 MG tablet [id number] No Take 1 tablet (500 mg total) by mouth every 8 (eight) hours as needed for muscle spasms.  Patient not taking: Reported on 12/12/2022   Arrien, York Ram, MD Not Taking Active   omeprazole (PRILOSEC) 20 MG capsule [id number] Yes Take 1 capsule (20 mg total) by mouth 2 (two) times daily before a meal. Ivonne Andrew, NP Taking Active Self, Pharmacy Records  triamcinolone cream (KENALOG) 0.1 % [id number]  APPLY 1 APPLICATION TOPICALLY 2 (TWO) TIMES DAILY.  Patient taking differently: Apply 1 Application topically daily as needed (for dryness  and itching).   Ivonne Andrew, NP  Active Self, Pharmacy Records              Assessment/Plan:   Hypertension: Patient reports blood pressure has improved since she returned home from hospital - Reviewed long term cardiovascular and renal outcomes of uncontrolled blood pressure - Recommended to check home blood pressure and heart rate daily. Record and bring to next appointment - Recommend to take all medications as prescribed. She is to call me or our office is she suspects she is having a side effect so we can discuss which medication might be causing issue rather than stopping all medications.   Hyperlipidemia/ASCVD Risk Reduction:LDL goal < 55; Tg goal < 150 and HDLC goal > 40 - Reviewed long term complications of uncontrolled cholesterol - Recommend to Take atorvastatin 80mg  EVERY day   Medication Management: - Currently strategy insufficient to maintain appropriate adherence to prescribed medication regimen - Suggested use of weekly pill box to organize medications. Will leave pill container at front desk for patient to pick up when she see PCP. - Reviewed medication list and changes that were made at hospital discharge. She still had a bottle of  hydrochlorothiazide and I instructed her not to take. Instead she has been prescribe chlorthalidone 25mg  daily.  - Reviewed which medications were maintenance medications and which were to be used as needed.    Made hospital follow up appointment with PCP for 12/15/2022. Patient to see if case manager that she is meeting with tomorrow can arrange transportation.  Also to ask if home health might be able to provide PT/OT - if they can, PCP can assess and order 9/27  Follow Up Plan: 7 to 10 days to check   Henrene Pastor, PharmD Clinical Pharmacist [facility]  Population Health [phone]

## 2022-12-15 ENCOUNTER — Inpatient Hospital Stay: Payer: Medicaid Other | Admitting: Family Medicine

## 2022-12-15 DIAGNOSIS — I639 Cerebral infarction, unspecified: Secondary | ICD-10-CM | POA: Diagnosis not present

## 2022-12-15 DIAGNOSIS — I10 Essential (primary) hypertension: Secondary | ICD-10-CM | POA: Diagnosis not present

## 2022-12-15 DIAGNOSIS — R3981 Functional urinary incontinence: Secondary | ICD-10-CM | POA: Diagnosis not present

## 2022-12-15 NOTE — Progress Notes (Deleted)
   Acute Office Visit  Subjective:     Patient ID: Brittany [name], female    DOB: 1966/03/07, 57 y.o.   MRN: [id number]  No chief complaint on file.   HPI Patient is in today for hospital follow-up. ***  Patient was seen here for routine follow-up on 12/05/22 and noted hypertension, dyspnea, and chest discomfort with EKG changes (she admitted to poor medication compliance). She was escorted to the ED and ended up being admitted to [facility] 12/05/22 - 12/09/22. Labs revealed elevated troponins, Na 140, K 3.2, Cl 103, bicarb 23, glucose 106, BUN 6, Cr 1.0, WBC 6.1, Hgb 16.4, platelets 198. CXR with mild cardiomegaly, no acute infiltrates or effusions. Echo: EF 70-75%, severe LVH, RV systolic function preserved, no significant valvular disease or wall motion abnormalities. They ruled out acute coronary syndrome, and suspected elevated troponins due to uncontrolled HTN. AKI stabilized prior to discharge. Potassium was replaced during admission. She was started on Protonix and Carafate with improvement in symptoms. She does have history of CVA was instructed to continue aspirin and close BP control. Hospitalist recommend adjusting losartan as able with renal function in hopes of weaning off of hydralazine to simply regimen. She was referred to [facility].   Medications at discharge: Amlodipine 10 mg daily Atorvastatin 80 mg daily Chlorthalidone 25 mg daily Cyanocobalamin 2,000 mcg daily Gabapentin 200 mg TID Hydralazine 50 mg TID Hydroxyzine 50 mg daily PRN Linzess 72 mcg daily Losartan 25 mg daily Methocarbamol 500 mg TID PRN Omeprazole 20 mg BID Vitamin D3 800 units daily   ***       ROS All review of systems negative except what is listed in the HPI      Objective:    There were no vitals taken for this visit. {Vitals History (Optional):23777}  Physical Exam  No results found for any visits on 12/15/22.      Assessment & Plan:   Problem List  Items Addressed This Visit   None   No orders of the defined types were placed in this encounter.   No follow-ups on file.  Clayborne Dana, NP

## 2022-12-20 DIAGNOSIS — I10 Essential (primary) hypertension: Secondary | ICD-10-CM | POA: Diagnosis not present

## 2022-12-20 DIAGNOSIS — R9431 Abnormal electrocardiogram [ECG] [EKG]: Secondary | ICD-10-CM | POA: Diagnosis not present

## 2022-12-20 DIAGNOSIS — I214 Non-ST elevation (NSTEMI) myocardial infarction: Secondary | ICD-10-CM | POA: Diagnosis not present

## 2022-12-22 ENCOUNTER — Inpatient Hospital Stay: Payer: Medicaid Other | Admitting: Family Medicine

## 2022-12-25 ENCOUNTER — Inpatient Hospital Stay: Payer: Medicaid Other | Admitting: Family Medicine

## 2022-12-25 ENCOUNTER — Encounter: Payer: Self-pay | Admitting: Family Medicine

## 2022-12-25 ENCOUNTER — Ambulatory Visit (INDEPENDENT_AMBULATORY_CARE_PROVIDER_SITE_OTHER): Payer: Medicaid Other | Admitting: Family Medicine

## 2022-12-25 VITALS — BP 176/80 | HR 105 | Resp 18 | Ht 61.0 in | Wt 165.8 lb

## 2022-12-25 DIAGNOSIS — M5416 Radiculopathy, lumbar region: Secondary | ICD-10-CM | POA: Diagnosis not present

## 2022-12-25 DIAGNOSIS — I6930 Unspecified sequelae of cerebral infarction: Secondary | ICD-10-CM | POA: Diagnosis not present

## 2022-12-25 DIAGNOSIS — I10 Essential (primary) hypertension: Secondary | ICD-10-CM | POA: Diagnosis not present

## 2022-12-25 DIAGNOSIS — E538 Deficiency of other specified B group vitamins: Secondary | ICD-10-CM

## 2022-12-25 DIAGNOSIS — E559 Vitamin D deficiency, unspecified: Secondary | ICD-10-CM

## 2022-12-25 DIAGNOSIS — Z1211 Encounter for screening for malignant neoplasm of colon: Secondary | ICD-10-CM

## 2022-12-25 DIAGNOSIS — Z01419 Encounter for gynecological examination (general) (routine) without abnormal findings: Secondary | ICD-10-CM

## 2022-12-25 MED ORDER — LOSARTAN POTASSIUM 50 MG PO TABS: 50.0000 mg | ORAL_TABLET | Freq: Every day | ORAL | 5 refills | Status: DC

## 2022-12-25 NOTE — Assessment & Plan Note (Signed)
Elevated blood pressure in clinic, but patient reports improved readings at home. Currently on amlodipine 10mg  daily, chlorthalidone 25mg  daily, hydralazine 50mg  TID, and losartan 25mg  daily. -Increase losartan to 50 mg daily. Continue other meds -Continue current medications and monitor blood pressure at home. -2 week follow-up, repeat BMP at that time -Keep cardiology appointment

## 2022-12-25 NOTE — Assessment & Plan Note (Signed)
Due for repeat labs

## 2022-12-25 NOTE — Progress Notes (Signed)
Established Patient Office Visit  Subjective   Patient ID: Brittany [name], female    DOB: 1965-03-25  Age: 57 y.o. MRN: [id number]  Chief Complaint  Patient presents with   Follow-up    Pt states both upper legs are numb and tingling onset 12/05/2022 "after leaving hospital" pt states she feels as though she has ben knocked back several steps Pt states when walking her HR goes up     HPI  Patient is here for hospital follow-up. She was admitted at [facility] 12/05/22 - 12/09/22.  Patient saw me at primary care office on 12/05/2022 with symptoms of palpitations, chest discomfort, dyspnea with exertion, uncontrolled hypertension.  She reported taking her blood pressure meds inconsistently.  EKG at that visit was abnormal and given her symptoms she agreed to go to the emergency department.  She ended up being admitted to [facility].  Labs revealed high sensitivity troponin 41 and 49.  Sodium 140, potassium 3.2, chloride 103, bicarb 23, glucose 106, BUN 9, creatinine 1.0, WBC 6.1, hemoglobin 16.4, platelets 198.  Chest x-ray showed mild cardiomegaly without infiltrates or effusions.  Her EKG in the emergency department revealed heart rate of 117, normal axis, normal intervals, sinus rhythm without significant ST or T wave changes.  Echo revealed EF of 70 to 75% with severe LVH, RV systolic function preserved, no significant valvular disease.  No wall motion abnormalities.  They suggested troponin elevation was due to uncontrolled hypertension rather than acute coronary syndrome.  She was discharged on amlodipine, hydralazine, losartan, chlorthalidone.  Recommended outpatient follow-up to increase losartan and wean off of hydralazine to simplify medication regimen.  She was given 40 mEq potassium prior to discharge with recommended labs at follow-up appointment.  She was also started on Protonix and Carafate for dyspepsia.  She was to continue her aspirin due to history of CVA.  She was referred to  the hypertension clinic/cardiology and is scheduled to see them on December 5th.    TODAY: Discussed the use of AI scribe software for clinical note transcription with the patient, who gave verbal consent to proceed.  History of Present Illness   The patient, with a history of hypertension, presents with persistently high blood pressure despite adherence to prescribed medications, including amlodipine, chlorthalidone, hydralazine, and losartan. They report feeling better overall since their last visit, with no chest pain or headaches. However, they note a sensation of weakness and fatigue after exertion, such as walking from the car to the clinic.  In addition to hypertension, the patient has been experiencing neuropathic symptoms in their legs, including numbness, burning, and weakness. These symptoms have worsened recently, with the addition of a new sensation of superficial numbness in patches on both thighs. The patient reports that their legs feel weaker than before, despite ongoing home therapy exercises. The patient also describes radiating pain originating from their lower back, wrapping around the hip and groin, and extending down the side of the leg. This pain is associated with the numbness in their thighs. The patient has a history of disc issues in their spine and was previously under the care of an orthopedic specialist. They were planning on getting an epidural, but have not yet.  The patient also mentions a need for a GYN referral for gynecological exam, as it has been a long time since their last one. They also need to see a gastroenterologist for a colonoscopy, as they are due for this procedure.  ROS All review of systems negative except what is listed in the HPI    Objective:     BP (!) 176/80   Pulse (!) 105   Resp 18   Ht 5\' 1"  (1.549 m)   Wt 165 lb 12.8 oz (75.2 kg)   SpO2 100%   BMI 31.33 kg/m    Physical Exam Vitals reviewed.   Constitutional:      Appearance: Normal appearance.  Cardiovascular:     Rate and Rhythm: Normal rate and regular rhythm.     Heart sounds: Normal heart sounds.  Pulmonary:     Effort: Pulmonary effort is normal.     Breath sounds: Normal breath sounds.  Skin:    General: Skin is warm and dry.  Neurological:     Mental Status: She is alert and oriented to person, place, and time.  Psychiatric:        Mood and Affect: Mood normal.        Behavior: Behavior normal.        Thought Content: Thought content normal.        Judgment: Judgment normal.      No results found for any visits on 12/25/22.    The ASCVD Risk score (Arnett DK, et al., 2019) failed to calculate for the following reasons:   The patient has a prior MI or stroke diagnosis    Assessment & Plan:   Problem List Items Addressed This Visit       Active Problems   Essential hypertension    Elevated blood pressure in clinic, but patient reports improved readings at home. Currently on amlodipine 10mg  daily, chlorthalidone 25mg  daily, hydralazine 50mg  TID, and losartan 25mg  daily. -Increase losartan to 50 mg daily. Continue other meds -Continue current medications and monitor blood pressure at home. -2 week follow-up, repeat BMP at that time -Keep cardiology appointment      Relevant Medications   losartan (COZAAR) 50 MG tablet   Other Relevant Orders   Lipid panel   Magnesium   VITAMIN D 25 Hydroxy (Vit-D Deficiency, Fractures)   Comprehensive metabolic panel   W29 deficiency    Due for repeat labs      Relevant Orders   B12 and Folate Panel   Other Visit Diagnoses     Screen for colon cancer    -  Primary   Relevant Orders   Ambulatory referral to Gastroenterology   Vitamin D deficiency       Relevant Orders   VITAMIN D 25 Hydroxy (Vit-D Deficiency, Fractures)   Well woman exam       Relevant Orders   Ambulatory referral to Obstetrics / Gynecology   Lumbar radiculopathy          Neuropathy and Lumbar Disc Disease Reports of increased discomfort in low back and legs, likely related to previous lumbar disc issues. Patient has been waiting for epidural procedure. -Patient states she will call her ortho provider and schedule a follow-up  Return in about 2 weeks (around 01/08/2023) for HTN follow-up.    Clayborne Dana, NP

## 2022-12-25 NOTE — Patient Instructions (Signed)
Increase to losartan 50 mg daily

## 2022-12-25 NOTE — Progress Notes (Deleted)
   Established Patient Office Visit  Subjective   Patient ID: Brittany [name], female    DOB: August 21, 1965  Age: 57 y.o. MRN: [id number]  No chief complaint on file.   HPI  Patient is here for hospital follow-up. She was admitted at [facility] 12/05/22 - 12/09/22.  Patient saw me at primary care office on 12/05/2022 with symptoms of palpitations, chest discomfort, dyspnea with exertion, uncontrolled hypertension.  She reported taking her blood pressure meds inconsistently.  EKG at that visit was abnormal and given her symptoms she agreed to go to the emergency department.  She ended up being admitted to [facility].  Labs revealed high sensitivity troponin 41 and 49.  Sodium 140, potassium 3.2, chloride 103, bicarb 23, glucose 106, BUN 9, creatinine 1.0, WBC 6.1, hemoglobin 16.4, platelets 198.  Chest x-ray showed mild cardiomegaly without infiltrates or effusions.  Her EKG in the emergency department revealed heart rate of 117, normal axis, normal intervals, sinus rhythm without significant ST or T wave changes.  Echo revealed EF of 70 to 75% with severe LVH, RV systolic function preserved, no significant valvular disease.  No wall motion abnormalities.  They suggested troponin elevation was due to uncontrolled hypertension rather than acute coronary syndrome.  She was discharged on amlodipine, hydralazine, losartan, chlorthalidone.  Recommended outpatient follow-up to increase losartan and wean off of hydralazine to simplify medication regimen.  She was given 40 mEq potassium prior to discharge with recommended labs at follow-up appointment.  She was also started on Protonix and Carafate for dyspepsia.  She was to continue her aspirin due to history of CVA.  She was referred to the hypertension clinic/cardiology and is scheduled to see them on December 5th.  ***           {History (Optional):23778}  ROS All review of systems negative except what is listed in the HPI    Objective:      There were no vitals taken for this visit. {Vitals History (Optional):23777}  Physical Exam   No results found for any visits on 12/25/22.  {Labs (Optional):23779}  The ASCVD Risk score (Arnett DK, et al., 2019) failed to calculate for the following reasons:   The patient has a prior MI or stroke diagnosis    Assessment & Plan:   Problem List Items Addressed This Visit   None   No follow-ups on file.    Clayborne Dana, NP

## 2022-12-26 ENCOUNTER — Other Ambulatory Visit: Payer: Medicaid Other

## 2022-12-26 DIAGNOSIS — I6930 Unspecified sequelae of cerebral infarction: Secondary | ICD-10-CM | POA: Diagnosis not present

## 2022-12-27 DIAGNOSIS — I6930 Unspecified sequelae of cerebral infarction: Secondary | ICD-10-CM | POA: Diagnosis not present

## 2022-12-28 DIAGNOSIS — I6930 Unspecified sequelae of cerebral infarction: Secondary | ICD-10-CM | POA: Diagnosis not present

## 2022-12-29 DIAGNOSIS — I6930 Unspecified sequelae of cerebral infarction: Secondary | ICD-10-CM | POA: Diagnosis not present

## 2023-01-01 DIAGNOSIS — I6930 Unspecified sequelae of cerebral infarction: Secondary | ICD-10-CM | POA: Diagnosis not present

## 2023-01-02 DIAGNOSIS — I6930 Unspecified sequelae of cerebral infarction: Secondary | ICD-10-CM | POA: Diagnosis not present

## 2023-01-03 DIAGNOSIS — I6930 Unspecified sequelae of cerebral infarction: Secondary | ICD-10-CM | POA: Diagnosis not present

## 2023-01-04 DIAGNOSIS — I6930 Unspecified sequelae of cerebral infarction: Secondary | ICD-10-CM | POA: Diagnosis not present

## 2023-01-05 DIAGNOSIS — I6930 Unspecified sequelae of cerebral infarction: Secondary | ICD-10-CM | POA: Diagnosis not present

## 2023-01-08 ENCOUNTER — Ambulatory Visit: Payer: Medicaid Other | Admitting: Family Medicine

## 2023-01-08 DIAGNOSIS — I6930 Unspecified sequelae of cerebral infarction: Secondary | ICD-10-CM | POA: Diagnosis not present

## 2023-01-09 DIAGNOSIS — I6930 Unspecified sequelae of cerebral infarction: Secondary | ICD-10-CM | POA: Diagnosis not present

## 2023-01-10 ENCOUNTER — Ambulatory Visit: Payer: Medicaid Other | Admitting: Family Medicine

## 2023-01-10 DIAGNOSIS — I6930 Unspecified sequelae of cerebral infarction: Secondary | ICD-10-CM | POA: Diagnosis not present

## 2023-01-12 DIAGNOSIS — I6930 Unspecified sequelae of cerebral infarction: Secondary | ICD-10-CM | POA: Diagnosis not present

## 2023-01-14 DIAGNOSIS — I639 Cerebral infarction, unspecified: Secondary | ICD-10-CM | POA: Diagnosis not present

## 2023-01-14 DIAGNOSIS — R3981 Functional urinary incontinence: Secondary | ICD-10-CM | POA: Diagnosis not present

## 2023-01-14 DIAGNOSIS — I10 Essential (primary) hypertension: Secondary | ICD-10-CM | POA: Diagnosis not present

## 2023-01-15 DIAGNOSIS — I6930 Unspecified sequelae of cerebral infarction: Secondary | ICD-10-CM | POA: Diagnosis not present

## 2023-01-16 DIAGNOSIS — I6930 Unspecified sequelae of cerebral infarction: Secondary | ICD-10-CM | POA: Diagnosis not present

## 2023-01-17 DIAGNOSIS — I6930 Unspecified sequelae of cerebral infarction: Secondary | ICD-10-CM | POA: Diagnosis not present

## 2023-01-18 DIAGNOSIS — I6930 Unspecified sequelae of cerebral infarction: Secondary | ICD-10-CM | POA: Diagnosis not present

## 2023-01-19 DIAGNOSIS — I6930 Unspecified sequelae of cerebral infarction: Secondary | ICD-10-CM | POA: Diagnosis not present

## 2023-01-22 DIAGNOSIS — I6930 Unspecified sequelae of cerebral infarction: Secondary | ICD-10-CM | POA: Diagnosis not present

## 2023-01-23 DIAGNOSIS — I6930 Unspecified sequelae of cerebral infarction: Secondary | ICD-10-CM | POA: Diagnosis not present

## 2023-01-24 DIAGNOSIS — I6930 Unspecified sequelae of cerebral infarction: Secondary | ICD-10-CM | POA: Diagnosis not present

## 2023-01-25 DIAGNOSIS — I6930 Unspecified sequelae of cerebral infarction: Secondary | ICD-10-CM | POA: Diagnosis not present

## 2023-01-26 DIAGNOSIS — I6930 Unspecified sequelae of cerebral infarction: Secondary | ICD-10-CM | POA: Diagnosis not present

## 2023-01-29 DIAGNOSIS — I6930 Unspecified sequelae of cerebral infarction: Secondary | ICD-10-CM | POA: Diagnosis not present

## 2023-01-30 DIAGNOSIS — I6930 Unspecified sequelae of cerebral infarction: Secondary | ICD-10-CM | POA: Diagnosis not present

## 2023-01-31 DIAGNOSIS — I6930 Unspecified sequelae of cerebral infarction: Secondary | ICD-10-CM | POA: Diagnosis not present

## 2023-02-01 DIAGNOSIS — I6930 Unspecified sequelae of cerebral infarction: Secondary | ICD-10-CM | POA: Diagnosis not present

## 2023-02-02 DIAGNOSIS — I6930 Unspecified sequelae of cerebral infarction: Secondary | ICD-10-CM | POA: Diagnosis not present

## 2023-02-05 DIAGNOSIS — I6930 Unspecified sequelae of cerebral infarction: Secondary | ICD-10-CM | POA: Diagnosis not present

## 2023-02-06 DIAGNOSIS — I6930 Unspecified sequelae of cerebral infarction: Secondary | ICD-10-CM | POA: Diagnosis not present

## 2023-02-07 DIAGNOSIS — I6930 Unspecified sequelae of cerebral infarction: Secondary | ICD-10-CM | POA: Diagnosis not present

## 2023-02-08 DIAGNOSIS — I6930 Unspecified sequelae of cerebral infarction: Secondary | ICD-10-CM | POA: Diagnosis not present

## 2023-02-09 ENCOUNTER — Telehealth: Payer: Self-pay | Admitting: Family Medicine

## 2023-02-09 DIAGNOSIS — I6930 Unspecified sequelae of cerebral infarction: Secondary | ICD-10-CM | POA: Diagnosis not present

## 2023-02-09 NOTE — Telephone Encounter (Signed)
Pt was called and scheduled for 12.2.24 appt per pt request

## 2023-02-09 NOTE — Telephone Encounter (Signed)
Patient needs appt, she was supposed to be seen 2 weeks after her last appt. Needs follow up to evaluate medications. Thanks!

## 2023-02-09 NOTE — Telephone Encounter (Signed)
Patient called and states was in hospital a month ago and she remembers them saying was going to refill all her meds. She called pharmacy and all they have is the gabapentin. She states needs med refill on ALL meds. Please call

## 2023-02-12 DIAGNOSIS — I6930 Unspecified sequelae of cerebral infarction: Secondary | ICD-10-CM | POA: Diagnosis not present

## 2023-02-13 DIAGNOSIS — I639 Cerebral infarction, unspecified: Secondary | ICD-10-CM | POA: Diagnosis not present

## 2023-02-13 DIAGNOSIS — R3981 Functional urinary incontinence: Secondary | ICD-10-CM | POA: Diagnosis not present

## 2023-02-13 DIAGNOSIS — I6930 Unspecified sequelae of cerebral infarction: Secondary | ICD-10-CM | POA: Diagnosis not present

## 2023-02-13 DIAGNOSIS — I10 Essential (primary) hypertension: Secondary | ICD-10-CM | POA: Diagnosis not present

## 2023-02-14 DIAGNOSIS — I6930 Unspecified sequelae of cerebral infarction: Secondary | ICD-10-CM | POA: Diagnosis not present

## 2023-02-16 DIAGNOSIS — I6930 Unspecified sequelae of cerebral infarction: Secondary | ICD-10-CM | POA: Diagnosis not present

## 2023-02-19 ENCOUNTER — Ambulatory Visit: Payer: Medicaid Other | Admitting: Family Medicine

## 2023-02-19 DIAGNOSIS — I6930 Unspecified sequelae of cerebral infarction: Secondary | ICD-10-CM | POA: Diagnosis not present

## 2023-02-20 DIAGNOSIS — I6930 Unspecified sequelae of cerebral infarction: Secondary | ICD-10-CM | POA: Diagnosis not present

## 2023-02-21 DIAGNOSIS — I6930 Unspecified sequelae of cerebral infarction: Secondary | ICD-10-CM | POA: Diagnosis not present

## 2023-02-22 ENCOUNTER — Institutional Professional Consult (permissible substitution) (HOSPITAL_BASED_OUTPATIENT_CLINIC_OR_DEPARTMENT_OTHER): Payer: Medicaid Other | Admitting: Family

## 2023-02-22 DIAGNOSIS — I6930 Unspecified sequelae of cerebral infarction: Secondary | ICD-10-CM | POA: Diagnosis not present

## 2023-02-23 DIAGNOSIS — I6930 Unspecified sequelae of cerebral infarction: Secondary | ICD-10-CM | POA: Diagnosis not present

## 2023-02-26 DIAGNOSIS — I6930 Unspecified sequelae of cerebral infarction: Secondary | ICD-10-CM | POA: Diagnosis not present

## 2023-02-27 DIAGNOSIS — I6930 Unspecified sequelae of cerebral infarction: Secondary | ICD-10-CM | POA: Diagnosis not present

## 2023-02-28 DIAGNOSIS — I6930 Unspecified sequelae of cerebral infarction: Secondary | ICD-10-CM | POA: Diagnosis not present

## 2023-03-01 DIAGNOSIS — I6930 Unspecified sequelae of cerebral infarction: Secondary | ICD-10-CM | POA: Diagnosis not present

## 2023-03-02 DIAGNOSIS — I6930 Unspecified sequelae of cerebral infarction: Secondary | ICD-10-CM | POA: Diagnosis not present

## 2023-03-05 DIAGNOSIS — I6930 Unspecified sequelae of cerebral infarction: Secondary | ICD-10-CM | POA: Diagnosis not present

## 2023-03-06 DIAGNOSIS — I6930 Unspecified sequelae of cerebral infarction: Secondary | ICD-10-CM | POA: Diagnosis not present

## 2023-03-07 DIAGNOSIS — I6930 Unspecified sequelae of cerebral infarction: Secondary | ICD-10-CM | POA: Diagnosis not present

## 2023-03-08 DIAGNOSIS — I6930 Unspecified sequelae of cerebral infarction: Secondary | ICD-10-CM | POA: Diagnosis not present

## 2023-03-09 DIAGNOSIS — I6930 Unspecified sequelae of cerebral infarction: Secondary | ICD-10-CM | POA: Diagnosis not present

## 2023-03-12 DIAGNOSIS — I6930 Unspecified sequelae of cerebral infarction: Secondary | ICD-10-CM | POA: Diagnosis not present

## 2023-03-15 DIAGNOSIS — R3981 Functional urinary incontinence: Secondary | ICD-10-CM | POA: Diagnosis not present

## 2023-03-15 DIAGNOSIS — I639 Cerebral infarction, unspecified: Secondary | ICD-10-CM | POA: Diagnosis not present

## 2023-03-15 DIAGNOSIS — I10 Essential (primary) hypertension: Secondary | ICD-10-CM | POA: Diagnosis not present

## 2023-03-16 DIAGNOSIS — I6930 Unspecified sequelae of cerebral infarction: Secondary | ICD-10-CM | POA: Diagnosis not present

## 2023-03-19 DIAGNOSIS — I6930 Unspecified sequelae of cerebral infarction: Secondary | ICD-10-CM | POA: Diagnosis not present

## 2023-03-20 DIAGNOSIS — I6930 Unspecified sequelae of cerebral infarction: Secondary | ICD-10-CM | POA: Diagnosis not present

## 2023-03-21 DIAGNOSIS — I6930 Unspecified sequelae of cerebral infarction: Secondary | ICD-10-CM | POA: Diagnosis not present

## 2023-03-22 DIAGNOSIS — I6930 Unspecified sequelae of cerebral infarction: Secondary | ICD-10-CM | POA: Diagnosis not present

## 2023-03-23 DIAGNOSIS — I6930 Unspecified sequelae of cerebral infarction: Secondary | ICD-10-CM | POA: Diagnosis not present

## 2023-03-26 DIAGNOSIS — I6930 Unspecified sequelae of cerebral infarction: Secondary | ICD-10-CM | POA: Diagnosis not present

## 2023-03-27 DIAGNOSIS — I6930 Unspecified sequelae of cerebral infarction: Secondary | ICD-10-CM | POA: Diagnosis not present

## 2023-03-28 DIAGNOSIS — I6930 Unspecified sequelae of cerebral infarction: Secondary | ICD-10-CM | POA: Diagnosis not present

## 2023-03-29 DIAGNOSIS — I6930 Unspecified sequelae of cerebral infarction: Secondary | ICD-10-CM | POA: Diagnosis not present

## 2023-03-30 DIAGNOSIS — I6930 Unspecified sequelae of cerebral infarction: Secondary | ICD-10-CM | POA: Diagnosis not present

## 2023-04-02 DIAGNOSIS — I6930 Unspecified sequelae of cerebral infarction: Secondary | ICD-10-CM | POA: Diagnosis not present

## 2023-04-03 DIAGNOSIS — I6930 Unspecified sequelae of cerebral infarction: Secondary | ICD-10-CM | POA: Diagnosis not present

## 2023-04-04 DIAGNOSIS — I6930 Unspecified sequelae of cerebral infarction: Secondary | ICD-10-CM | POA: Diagnosis not present

## 2023-04-05 ENCOUNTER — Telehealth: Payer: Self-pay | Admitting: Neurology

## 2023-04-05 DIAGNOSIS — I6930 Unspecified sequelae of cerebral infarction: Secondary | ICD-10-CM | POA: Diagnosis not present

## 2023-04-05 NOTE — Telephone Encounter (Signed)
Anything needed will be ordered at visit.

## 2023-04-05 NOTE — Telephone Encounter (Signed)
Copied from CRM [id number]. Topic: Appointments - Appointment Scheduling >> Apr 05, 2023  9:56 AM Larwance Sachs wrote: Patient/patient representative is calling to schedule an appointment, did get scheduled for 04/08/2022. If patient needs labs complete from past office visit missed please put in orders/ help schedule

## 2023-04-06 DIAGNOSIS — I6930 Unspecified sequelae of cerebral infarction: Secondary | ICD-10-CM | POA: Diagnosis not present

## 2023-04-09 ENCOUNTER — Ambulatory Visit: Payer: Medicaid Other | Admitting: Family Medicine

## 2023-04-09 DIAGNOSIS — I6930 Unspecified sequelae of cerebral infarction: Secondary | ICD-10-CM | POA: Diagnosis not present

## 2023-04-10 DIAGNOSIS — I6930 Unspecified sequelae of cerebral infarction: Secondary | ICD-10-CM | POA: Diagnosis not present

## 2023-04-11 DIAGNOSIS — I6930 Unspecified sequelae of cerebral infarction: Secondary | ICD-10-CM | POA: Diagnosis not present

## 2023-04-12 ENCOUNTER — Observation Stay (HOSPITAL_COMMUNITY): Payer: Medicaid Other

## 2023-04-12 ENCOUNTER — Other Ambulatory Visit: Payer: Self-pay

## 2023-04-12 ENCOUNTER — Encounter (HOSPITAL_BASED_OUTPATIENT_CLINIC_OR_DEPARTMENT_OTHER): Payer: Self-pay | Admitting: *Deleted

## 2023-04-12 ENCOUNTER — Telehealth: Payer: Self-pay | Admitting: Neurology

## 2023-04-12 ENCOUNTER — Inpatient Hospital Stay (HOSPITAL_BASED_OUTPATIENT_CLINIC_OR_DEPARTMENT_OTHER)
Admission: EM | Admit: 2023-04-12 | Discharge: 2023-04-20 | DRG: 023 | Disposition: A | Discharge status: Rehabilitation Facility | Payer: Medicaid Other | Attending: Neurology | Admitting: Neurology

## 2023-04-12 ENCOUNTER — Emergency Department (HOSPITAL_BASED_OUTPATIENT_CLINIC_OR_DEPARTMENT_OTHER): Payer: Medicaid Other

## 2023-04-12 ENCOUNTER — Ambulatory Visit: Payer: Self-pay | Admitting: Family Medicine

## 2023-04-12 DIAGNOSIS — Z781 Physical restraint status: Secondary | ICD-10-CM

## 2023-04-12 DIAGNOSIS — Z885 Allergy status to narcotic agent status: Secondary | ICD-10-CM

## 2023-04-12 DIAGNOSIS — I6602 Occlusion and stenosis of left middle cerebral artery: Secondary | ICD-10-CM | POA: Diagnosis present

## 2023-04-12 DIAGNOSIS — R471 Dysarthria and anarthria: Secondary | ICD-10-CM | POA: Diagnosis present

## 2023-04-12 DIAGNOSIS — R930 Abnormal findings on diagnostic imaging of skull and head, not elsewhere classified: Secondary | ICD-10-CM | POA: Diagnosis not present

## 2023-04-12 DIAGNOSIS — E739 Lactose intolerance, unspecified: Secondary | ICD-10-CM | POA: Diagnosis present

## 2023-04-12 DIAGNOSIS — Z7982 Long term (current) use of aspirin: Secondary | ICD-10-CM

## 2023-04-12 DIAGNOSIS — Z8673 Personal history of transient ischemic attack (TIA), and cerebral infarction without residual deficits: Secondary | ICD-10-CM

## 2023-04-12 DIAGNOSIS — N179 Acute kidney failure, unspecified: Secondary | ICD-10-CM | POA: Diagnosis present

## 2023-04-12 DIAGNOSIS — I639 Cerebral infarction, unspecified: Secondary | ICD-10-CM | POA: Diagnosis present

## 2023-04-12 DIAGNOSIS — E7800 Pure hypercholesterolemia, unspecified: Secondary | ICD-10-CM | POA: Diagnosis present

## 2023-04-12 DIAGNOSIS — E1140 Type 2 diabetes mellitus with diabetic neuropathy, unspecified: Secondary | ICD-10-CM | POA: Diagnosis not present

## 2023-04-12 DIAGNOSIS — Z6829 Body mass index (BMI) 29.0-29.9, adult: Secondary | ICD-10-CM

## 2023-04-12 DIAGNOSIS — G839 Paralytic syndrome, unspecified: Secondary | ICD-10-CM | POA: Diagnosis present

## 2023-04-12 DIAGNOSIS — E876 Hypokalemia: Secondary | ICD-10-CM | POA: Diagnosis present

## 2023-04-12 DIAGNOSIS — I69364 Other paralytic syndrome following cerebral infarction affecting left non-dominant side: Secondary | ICD-10-CM

## 2023-04-12 DIAGNOSIS — G9349 Other encephalopathy: Secondary | ICD-10-CM | POA: Diagnosis not present

## 2023-04-12 DIAGNOSIS — R29810 Facial weakness: Secondary | ICD-10-CM | POA: Diagnosis not present

## 2023-04-12 DIAGNOSIS — K219 Gastro-esophageal reflux disease without esophagitis: Secondary | ICD-10-CM | POA: Diagnosis present

## 2023-04-12 DIAGNOSIS — Z800 Family history of malignant neoplasm of digestive organs: Secondary | ICD-10-CM

## 2023-04-12 DIAGNOSIS — Z823 Family history of stroke: Secondary | ICD-10-CM

## 2023-04-12 DIAGNOSIS — I69320 Aphasia following cerebral infarction: Secondary | ICD-10-CM

## 2023-04-12 DIAGNOSIS — R4701 Aphasia: Principal | ICD-10-CM | POA: Diagnosis present

## 2023-04-12 DIAGNOSIS — R2689 Other abnormalities of gait and mobility: Secondary | ICD-10-CM | POA: Diagnosis present

## 2023-04-12 DIAGNOSIS — Z836 Family history of other diseases of the respiratory system: Secondary | ICD-10-CM

## 2023-04-12 DIAGNOSIS — I6930 Unspecified sequelae of cerebral infarction: Secondary | ICD-10-CM | POA: Diagnosis not present

## 2023-04-12 DIAGNOSIS — I618 Other nontraumatic intracerebral hemorrhage: Secondary | ICD-10-CM | POA: Diagnosis not present

## 2023-04-12 DIAGNOSIS — R1310 Dysphagia, unspecified: Secondary | ICD-10-CM | POA: Diagnosis not present

## 2023-04-12 DIAGNOSIS — K589 Irritable bowel syndrome without diarrhea: Secondary | ICD-10-CM | POA: Diagnosis present

## 2023-04-12 DIAGNOSIS — Z8269 Family history of other diseases of the musculoskeletal system and connective tissue: Secondary | ICD-10-CM

## 2023-04-12 DIAGNOSIS — E669 Obesity, unspecified: Secondary | ICD-10-CM | POA: Diagnosis not present

## 2023-04-12 DIAGNOSIS — C9000 Multiple myeloma not having achieved remission: Secondary | ICD-10-CM | POA: Diagnosis present

## 2023-04-12 DIAGNOSIS — I10 Essential (primary) hypertension: Secondary | ICD-10-CM | POA: Diagnosis not present

## 2023-04-12 DIAGNOSIS — K5900 Constipation, unspecified: Secondary | ICD-10-CM | POA: Diagnosis not present

## 2023-04-12 DIAGNOSIS — G8191 Hemiplegia, unspecified affecting right dominant side: Secondary | ICD-10-CM | POA: Diagnosis present

## 2023-04-12 DIAGNOSIS — Q2112 Patent foramen ovale: Secondary | ICD-10-CM | POA: Diagnosis not present

## 2023-04-12 DIAGNOSIS — R4789 Other speech disturbances: Secondary | ICD-10-CM | POA: Diagnosis present

## 2023-04-12 DIAGNOSIS — Z5982 Transportation insecurity: Secondary | ICD-10-CM

## 2023-04-12 DIAGNOSIS — Z87891 Personal history of nicotine dependence: Secondary | ICD-10-CM

## 2023-04-12 DIAGNOSIS — Z7902 Long term (current) use of antithrombotics/antiplatelets: Secondary | ICD-10-CM

## 2023-04-12 DIAGNOSIS — I63512 Cerebral infarction due to unspecified occlusion or stenosis of left middle cerebral artery: Secondary | ICD-10-CM | POA: Diagnosis not present

## 2023-04-12 DIAGNOSIS — I69398 Other sequelae of cerebral infarction: Secondary | ICD-10-CM

## 2023-04-12 DIAGNOSIS — R482 Apraxia: Secondary | ICD-10-CM | POA: Diagnosis not present

## 2023-04-12 DIAGNOSIS — I82411 Acute embolism and thrombosis of right femoral vein: Secondary | ICD-10-CM | POA: Diagnosis present

## 2023-04-12 DIAGNOSIS — I70202 Unspecified atherosclerosis of native arteries of extremities, left leg: Secondary | ICD-10-CM | POA: Diagnosis not present

## 2023-04-12 DIAGNOSIS — Z599 Problem related to housing and economic circumstances, unspecified: Secondary | ICD-10-CM

## 2023-04-12 DIAGNOSIS — R29716 NIHSS score 16: Secondary | ICD-10-CM | POA: Diagnosis not present

## 2023-04-12 DIAGNOSIS — Z91040 Latex allergy status: Secondary | ICD-10-CM

## 2023-04-12 DIAGNOSIS — R29711 NIHSS score 11: Secondary | ICD-10-CM | POA: Diagnosis not present

## 2023-04-12 DIAGNOSIS — I700 Atherosclerosis of aorta: Secondary | ICD-10-CM | POA: Diagnosis present

## 2023-04-12 DIAGNOSIS — Z9889 Other specified postprocedural states: Secondary | ICD-10-CM

## 2023-04-12 DIAGNOSIS — R4781 Slurred speech: Secondary | ICD-10-CM | POA: Diagnosis not present

## 2023-04-12 DIAGNOSIS — H538 Other visual disturbances: Secondary | ICD-10-CM | POA: Diagnosis not present

## 2023-04-12 DIAGNOSIS — R414 Neurologic neglect syndrome: Secondary | ICD-10-CM | POA: Diagnosis not present

## 2023-04-12 DIAGNOSIS — Z9221 Personal history of antineoplastic chemotherapy: Secondary | ICD-10-CM

## 2023-04-12 DIAGNOSIS — Z8379 Family history of other diseases of the digestive system: Secondary | ICD-10-CM

## 2023-04-12 DIAGNOSIS — R29705 NIHSS score 5: Secondary | ICD-10-CM | POA: Diagnosis not present

## 2023-04-12 DIAGNOSIS — Z888 Allergy status to other drugs, medicaments and biological substances status: Secondary | ICD-10-CM

## 2023-04-12 DIAGNOSIS — N8110 Cystocele, unspecified: Secondary | ICD-10-CM | POA: Diagnosis present

## 2023-04-12 DIAGNOSIS — Z8249 Family history of ischemic heart disease and other diseases of the circulatory system: Secondary | ICD-10-CM

## 2023-04-12 DIAGNOSIS — R29717 NIHSS score 17: Secondary | ICD-10-CM | POA: Diagnosis not present

## 2023-04-12 DIAGNOSIS — F1210 Cannabis abuse, uncomplicated: Secondary | ICD-10-CM | POA: Diagnosis not present

## 2023-04-12 DIAGNOSIS — Z79899 Other long term (current) drug therapy: Secondary | ICD-10-CM

## 2023-04-12 DIAGNOSIS — R29709 NIHSS score 9: Secondary | ICD-10-CM | POA: Diagnosis present

## 2023-04-12 DIAGNOSIS — I5189 Other ill-defined heart diseases: Secondary | ICD-10-CM

## 2023-04-12 DIAGNOSIS — Z9851 Tubal ligation status: Secondary | ICD-10-CM

## 2023-04-12 DIAGNOSIS — Z5941 Food insecurity: Secondary | ICD-10-CM

## 2023-04-12 DIAGNOSIS — R4189 Other symptoms and signs involving cognitive functions and awareness: Secondary | ICD-10-CM | POA: Diagnosis present

## 2023-04-12 DIAGNOSIS — Z602 Problems related to living alone: Secondary | ICD-10-CM | POA: Diagnosis present

## 2023-04-12 LAB — CBC WITH DIFFERENTIAL/PLATELET
Abs Immature Granulocytes: 0.02 10*3/uL (ref 0.00–0.07)
Basophils Absolute: 0 10*3/uL (ref 0.0–0.1)
Basophils Relative: 0 %
Eosinophils Absolute: 0.1 10*3/uL (ref 0.0–0.5)
Eosinophils Relative: 2 %
HCT: 45.2 % (ref 36.0–46.0)
Hemoglobin: 15 g/dL (ref 12.0–15.0)
Immature Granulocytes: 0 %
Lymphocytes Relative: 36 %
Lymphs Abs: 2.4 10*3/uL (ref 0.7–4.0)
MCH: 29.2 pg (ref 26.0–34.0)
MCHC: 33.2 g/dL (ref 30.0–36.0)
MCV: 87.9 fL (ref 80.0–100.0)
Monocytes Absolute: 0.5 10*3/uL (ref 0.1–1.0)
Monocytes Relative: 7 %
Neutro Abs: 3.7 10*3/uL (ref 1.7–7.7)
Neutrophils Relative %: 55 %
Platelets: 176 10*3/uL (ref 150–400)
RBC: 5.14 MIL/uL — ABNORMAL HIGH (ref 3.87–5.11)
RDW: 15.1 % (ref 11.5–15.5)
WBC: 6.7 10*3/uL (ref 4.0–10.5)
nRBC: 0 % (ref 0.0–0.2)

## 2023-04-12 LAB — COMPREHENSIVE METABOLIC PANEL
ALT: 15 U/L (ref 0–44)
AST: 25 U/L (ref 15–41)
Albumin: 4.3 g/dL (ref 3.5–5.0)
Alkaline Phosphatase: 123 U/L (ref 38–126)
Anion gap: 14 (ref 5–15)
BUN: 12 mg/dL (ref 6–20)
CO2: 20 mmol/L — ABNORMAL LOW (ref 22–32)
Calcium: 9.3 mg/dL (ref 8.9–10.3)
Chloride: 101 mmol/L (ref 98–111)
Creatinine, Ser: 1.11 mg/dL — ABNORMAL HIGH (ref 0.44–1.00)
GFR, Estimated: 58 mL/min — ABNORMAL LOW (ref >60)
Glucose, Bld: 161 mg/dL — ABNORMAL HIGH (ref 70–99)
Potassium: 3.3 mmol/L — ABNORMAL LOW (ref 3.5–5.1)
Sodium: 135 mmol/L (ref 135–145)
Total Bilirubin: 0.5 mg/dL (ref 0.0–1.2)
Total Protein: 8.2 g/dL — ABNORMAL HIGH (ref 6.5–8.1)

## 2023-04-12 LAB — URINALYSIS, ROUTINE W REFLEX MICROSCOPIC
Bilirubin Urine: NEGATIVE
Glucose, UA: NEGATIVE mg/dL
Hgb urine dipstick: NEGATIVE
Ketones, ur: NEGATIVE mg/dL
Leukocytes,Ua: NEGATIVE
Nitrite: NEGATIVE
Protein, ur: NEGATIVE mg/dL
Specific Gravity, Urine: 1.01 (ref 1.005–1.030)
pH: 5.5 (ref 5.0–8.0)

## 2023-04-12 LAB — RAPID URINE DRUG SCREEN, HOSP PERFORMED
Amphetamines: NOT DETECTED
Barbiturates: NOT DETECTED
Benzodiazepines: NOT DETECTED
Cocaine: NOT DETECTED
Opiates: NOT DETECTED
Tetrahydrocannabinol: POSITIVE — AB

## 2023-04-12 LAB — PROTIME-INR
INR: 1 (ref 0.8–1.2)
Prothrombin Time: 13.3 s (ref 11.4–15.2)

## 2023-04-12 LAB — LIPASE, BLOOD: Lipase: 78 U/L — ABNORMAL HIGH (ref 11–51)

## 2023-04-12 LAB — SALICYLATE LEVEL: Salicylate Lvl: <7 mg/dL — ABNORMAL LOW (ref 7.0–30.0)

## 2023-04-12 LAB — AMMONIA: Ammonia: 27 umol/L (ref 9–35)

## 2023-04-12 LAB — CBG MONITORING, ED: Glucose-Capillary: 166 mg/dL — ABNORMAL HIGH (ref 70–99)

## 2023-04-12 LAB — ACETAMINOPHEN LEVEL: Acetaminophen (Tylenol), Serum: <10 ug/mL — ABNORMAL LOW (ref 10–30)

## 2023-04-12 MED ORDER — IOHEXOL 350 MG/ML SOLN
75.0000 mL | Freq: Once | INTRAVENOUS | Status: AC | PRN
  Administered 2023-04-12: 75 mL via INTRAVENOUS

## 2023-04-12 MED ORDER — ATORVASTATIN CALCIUM 80 MG PO TABS
80.0000 mg | ORAL_TABLET | Freq: Every day | ORAL | Status: DC
  Administered 2023-04-13 – 2023-04-15 (×3): 80 mg via ORAL
  Filled 2023-04-12 (×3): qty 1

## 2023-04-12 MED ORDER — POTASSIUM CHLORIDE CRYS ER 20 MEQ PO TBCR
40.0000 meq | EXTENDED_RELEASE_TABLET | Freq: Once | ORAL | Status: AC
  Administered 2023-04-12: 40 meq via ORAL
  Filled 2023-04-12: qty 2

## 2023-04-12 MED ORDER — CHOLECALCIFEROL 10 MCG (400 UNIT) PO TABS
800.0000 [IU] | ORAL_TABLET | Freq: Every day | ORAL | Status: DC
  Administered 2023-04-13 – 2023-04-15 (×3): 800 [IU] via ORAL
  Filled 2023-04-12 (×5): qty 2

## 2023-04-12 MED ORDER — STROKE: EARLY STAGES OF RECOVERY BOOK
Freq: Once | Status: AC
  Administered 2023-04-13: 1
  Filled 2023-04-12: qty 1

## 2023-04-12 MED ORDER — PANTOPRAZOLE SODIUM 40 MG PO TBEC
40.0000 mg | DELAYED_RELEASE_TABLET | Freq: Every day | ORAL | Status: DC
Start: 2023-04-13 — End: 2023-04-16
  Administered 2023-04-13 – 2023-04-15 (×3): 40 mg via ORAL
  Filled 2023-04-12 (×3): qty 1

## 2023-04-12 MED ORDER — GABAPENTIN 300 MG PO CAPS
300.0000 mg | ORAL_CAPSULE | Freq: Three times a day (TID) | ORAL | Status: DC
  Administered 2023-04-12 – 2023-04-15 (×10): 300 mg via ORAL
  Filled 2023-04-12 (×10): qty 1

## 2023-04-12 MED ORDER — SODIUM CHLORIDE 0.9 % IV SOLN: INTRAVENOUS | Status: DC

## 2023-04-12 MED ORDER — ENOXAPARIN SODIUM 40 MG/0.4ML IJ SOSY
40.0000 mg | PREFILLED_SYRINGE | INTRAMUSCULAR | Status: DC
  Administered 2023-04-13 – 2023-04-20 (×7): 40 mg via SUBCUTANEOUS
  Filled 2023-04-12 (×7): qty 0.4

## 2023-04-12 MED ORDER — POTASSIUM CHLORIDE 10 MEQ/100ML IV SOLN
10.0000 meq | Freq: Once | INTRAVENOUS | Status: AC
  Administered 2023-04-12: 10 meq via INTRAVENOUS
  Filled 2023-04-12: qty 100

## 2023-04-12 MED ORDER — LABETALOL HCL 5 MG/ML IV SOLN
10.0000 mg | INTRAVENOUS | Status: DC | PRN
  Administered 2023-04-12 – 2023-04-15 (×8): 10 mg via INTRAVENOUS
  Filled 2023-04-12 (×8): qty 4

## 2023-04-12 MED ORDER — ASPIRIN 81 MG PO CHEW
81.0000 mg | CHEWABLE_TABLET | Freq: Every day | ORAL | Status: DC
  Administered 2023-04-12 – 2023-04-15 (×4): 81 mg via ORAL
  Filled 2023-04-12 (×4): qty 1

## 2023-04-12 NOTE — ED Provider Notes (Signed)
[place] EMERGENCY DEPARTMENT AT [facility] [place] Provider Note   CSN: [id number] Arrival date & time: 04/12/23  1225     History  Chief Complaint  Patient presents with  . Altered Mental Status    Brittany [name] is a 58 y.o. female.  Patient is a 58 year old female with past medical history of simple myeloma in 2005, cerebellar stroke in 2017, hypertension, hyperlipidemia, and obesity presenting for aphasia.  Patient's baseline is alert, active, working, lives alone but does have an Geophysicist/field seismologist, and to help her with daily chores due to her difficulty ambulating secondary to her previous stroke.  The mother states that over the past 7 days she has noticed that her daughter has had changes in her speech when she goes to visit her stating " it sounds like she has cotton balls in her mouth".  She states over the last 2 days she has had significant difficulty speaking including inability to identify objects.  She denies facial asymmetry, sensation, or motor dysfunction.  Current Lee patient is having difficulty providing HPI for herself due to aphasia.  Denies recent falls or head trauma.  The history is provided by the patient. No language interpreter was used.  Altered Mental Status Associated symptoms: no abdominal pain, no fever, no palpitations, no rash, no seizures and no vomiting        Home Medications Prior to Admission medications   Medication Sig Start Date End Date Taking? Authorizing Provider  albuterol (VENTOLIN HFA) 108 (90 Base) MCG/ACT inhaler Inhale 2 puffs into the lungs every 4 (four) hours as needed for wheezing or shortness of breath. 02/24/22   Ivonne Andrew, NP  amLODipine (NORVASC) 10 MG tablet Take 1 tablet (10 mg total) by mouth daily. 12/09/22 01/08/23  Arrien, York Ram, MD  atorvastatin (LIPITOR) 80 MG tablet Take 1 tablet (80 mg total) by mouth daily. 07/06/22 07/06/23  Clayborne Dana, NP  chlorthalidone (HYGROTON) 25 MG tablet Take 1  tablet (25 mg total) by mouth daily. 12/09/22 01/08/23  Arrien, York Ram, MD  Cholecalciferol (VITAMIN D3) 10 MCG (400 UNIT) tablet Take 800 Units by mouth daily.    [provider]  cyanocobalamin 2000 MCG tablet Take 1 tablet (2,000 mcg total) by mouth daily. Patient not taking: Reported on 12/12/2022 08/10/22   Hyman Hopes B, NP  gabapentin (NEURONTIN) 300 MG capsule Take 1 capsule (300 mg total) by mouth 3 (three) times daily. 07/07/22   Clayborne Dana, NP  hydrALAZINE (APRESOLINE) 50 MG tablet Take 1 tablet (50 mg total) by mouth every 8 (eight) hours. 12/09/22 01/08/23  Arrien, York Ram, MD  hydrOXYzine (VISTARIL) 50 MG capsule Take 50 mg by mouth daily as needed for itching. Patient not taking: Reported on 12/12/2022    [provider]  linaclotide Karlene Einstein) 72 MCG capsule Take 1 capsule (72 mcg total) by mouth daily before breakfast. Patient not taking: Reported on 12/25/2022 07/26/22   Clayborne Dana, NP  losartan (COZAAR) 50 MG tablet Take 1 tablet (50 mg total) by mouth daily. 12/25/22 01/24/23  Clayborne Dana, NP  methocarbamol (ROBAXIN) 500 MG tablet Take 1 tablet (500 mg total) by mouth every 8 (eight) hours as needed for muscle spasms. Patient not taking: Reported on 12/12/2022 12/09/22   Arrien, York Ram, MD  omeprazole (PRILOSEC) 20 MG capsule Take 1 capsule (20 mg total) by mouth 2 (two) times daily before a meal. 02/24/22 02/24/23  Ivonne Andrew, NP  triamcinolone cream (KENALOG) 0.1 %  APPLY 1 APPLICATION TOPICALLY 2 (TWO) TIMES DAILY. Patient taking differently: Apply 1 Application topically daily as needed (for dryness and itching). 11/09/21   Ivonne Andrew, NP      Allergies    Amitriptyline, Duloxetine, Naproxen, Other, Darvon [propoxyphene], Hydrocodone, Lactose intolerance (gi), Latex, Oxycodone, Percocet [oxycodone-acetaminophen], and Topamax [topiramate]    Review of Systems   Review of Systems  Constitutional:  Negative for chills and  fever.  HENT:  Negative for ear pain and sore throat.   Eyes:  Negative for pain and visual disturbance.  Respiratory:  Negative for cough and shortness of breath.   Cardiovascular:  Negative for chest pain and palpitations.  Gastrointestinal:  Negative for abdominal pain and vomiting.  Genitourinary:  Negative for dysuria and hematuria.  Musculoskeletal:  Negative for arthralgias and back pain.  Skin:  Negative for color change and rash.  Neurological:  Positive for speech difficulty. Negative for seizures and syncope.  All other systems reviewed and are negative.   Physical Exam Updated Vital Signs BP (!) 190/108   Pulse (!) 109   Temp 98.2 F (36.8 C)   Resp 20   SpO2 99%  Physical Exam Vitals and nursing note reviewed.  Constitutional:      General: She is not in acute distress.    Appearance: She is well-developed.  HENT:     Head: Normocephalic and atraumatic.  Eyes:     Conjunctiva/sclera: Conjunctivae normal.  Cardiovascular:     Rate and Rhythm: Normal rate and regular rhythm.     Heart sounds: No murmur heard. Pulmonary:     Effort: Pulmonary effort is normal. No respiratory distress.     Breath sounds: Normal breath sounds.  Abdominal:     Palpations: Abdomen is soft.     Tenderness: There is no abdominal tenderness.  Musculoskeletal:        General: No swelling.     Cervical back: Neck supple.  Skin:    General: Skin is warm and dry.     Capillary Refill: Capillary refill takes less than 2 seconds.  Neurological:     Mental Status: She is alert and oriented to person, place, and time.     GCS: GCS eye subscore is 4. GCS verbal subscore is 5. GCS motor subscore is 6.     Sensory: Sensation is intact.     Motor: Motor function is intact.     Coordination: Coordination is intact.     Comments: Expressive aphasia present Follows commands.  Answers questions with shaking head yes or no.  Able to say some sentences but cannot identify objects.  When I pointed  to a clock in the room and asked what object that was she said "nine".  When I pointed to a sink in the room and asked what object that was she stated "one".  She does have bilateral lower extremity weakness that is symmetrical on each side that her mom states is chronic due to her previous stroke.  Able to lift legs approximately 1 cm off the bed for less than 1 second only.  5/5 muscle strength upper bilateral extremities.  No facial asymmetry.  No slurred speech.  Psychiatric:        Mood and Affect: Mood normal.     ED Results / Procedures / Treatments   Labs (all labs ordered are listed, but only abnormal results are displayed) Labs Reviewed  CBC WITH DIFFERENTIAL/PLATELET  COMPREHENSIVE METABOLIC PANEL  LIPASE, BLOOD  AMMONIA  RAPID  URINE DRUG SCREEN, HOSP PERFORMED  URINALYSIS, ROUTINE W REFLEX MICROSCOPIC  ACETAMINOPHEN LEVEL  SALICYLATE LEVEL  PROTIME-INR  CBG MONITORING, ED    EKG None  Radiology No results found.  Procedures .Critical Care  Performed by: Franne Forts, DO Authorized by: Franne Forts, DO   Critical care provider statement:    Critical care time (minutes):  30   Critical care was time spent personally by me on the following activities:  Development of treatment plan with patient or surrogate, discussions with consultants, evaluation of patient's response to treatment, examination of patient, ordering and review of laboratory studies, ordering and review of radiographic studies, ordering and performing treatments and interventions, pulse oximetry, re-evaluation of patient's condition and review of old charts   Care discussed with: admitting provider     Care discussed with comment:  Neurology     Medications Ordered in ED Medications - No data to display  ED Course/ Medical Decision Making/ A&P Clinical Course as of 04/16/23 2342  Thu Apr 12, 2023  1530 1 week symptoms, worse since 2 days ago, has MCA stroke, [facility] accepting for ED to ED  for tx to [facility], expressive aphasia [JD]    Clinical Course User Index [JD] Laurence Spates, MD                                 Medical Decision Making Amount and/or Complexity of Data Reviewed Labs: ordered. Radiology: ordered.  Risk Prescription drug management.    58 year old female with past medical history of simple myeloma in 2005, cerebellar stroke in 2017, hypertension, hyperlipidemia, and obesity presenting for aphasia.  Patient alert oriented x 3, no acute distress, afebrile, per tensive with a blood pressure of 190/108.  Otherwise stable vital signs.  Physical exam demonstrates expressive aphasia.  Patient outside of stroke activation window at this time due to time of onset being 7 days ago.  CTH and CTA head and neck demonstrates 1. Areas of hypodensity in the left cerebral hemisphere, which are new from the prior exam and concerning for acute to subacute infarcts. An MRI is recommended for further evaluation. 2. 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 The anterior M2 is not well seen. Additional severe stenosis in the insular left M2, with overall poor opacification of distal left MCA branches. 3. No hemodynamically significant stenosis in the neck.  Spoke with neurologist Dr. Wilford Corner who agrees to be consulted on patient's care.  He request admission to the hospitalist service and ER to ER transfer while patient is waiting for bed.  I spoke with ER physician Dr. Rush Landmark who agrees to set patient for ER to ER transfer.          Final Clinical Impression(s) / ED Diagnoses Final diagnoses:  None    Rx / DC Orders ED Discharge Orders     None         Franne Forts, DO 04/16/23 2342

## 2023-04-12 NOTE — ED Triage Notes (Signed)
Pt is brought in by her parents due to "loss of cognitive skills" over the past 1-2 weeks but initially this was mild with a slurring of her speech over the past 2 weeks (sounded like "she had powder in her mouth when speaking").  Worsening cognitive decline over the past 2 days.  Per her mother she has been forgetting passwords, having difficulty getting her words out.

## 2023-04-12 NOTE — Plan of Care (Signed)

## 2023-04-12 NOTE — ED Notes (Signed)
Pt arrives by [facility] from [facility] for MRI and admission. Pt d/w MRI and EDP. Pt alert, NAD, calm, interactive. H/o CVA with deficits. Pending arrival of parents.

## 2023-04-12 NOTE — ED Notes (Signed)
Pt in MRI.

## 2023-04-12 NOTE — Telephone Encounter (Signed)
Information obtained from mother.  Mother states that pt is having garbles speech as well as loss of memory. Mother states that she was slurring her words 2 days ago, but now not forming any coherent speech. Advised mother to call 911 for the pt and have her transported to nearest ED. Mother agreed.  Chief Complaint: stroke like s/s Symptoms: acute speech issues, memory problems Frequency: last known well was last Friday  Disposition: [x] ED /[] Urgent Care (no appt availability in office) / [] Appointment(In office/virtual)/ []  [facility]/ [] Home Care/ [] Refused Recommended Disposition /[] [facility]/ []  Follow-up with PCP  Additional Notes: Pt mother agreeable to calling 911 and transporting to nearest ED.    Copied from CRM [id number]. Topic: Clinical - Red Word Triage >> Apr 12, 2023 11:16 AM Fredrich Romans wrote: Red Word that prompted transfer to Nurse Triage: lost all of memory,may have had a stroke,no cognitive skills Reason for Disposition . [1] Loss of speech or garbled speech AND [2] new-onset  Answer Assessment - Initial Assessment Questions 1. LEVEL OF CONSCIOUSNESS: "How is he (she, the patient) acting right now?" (e.g., alert-oriented, confused, lethargic, stuporous, comatose)     Per mother pt is confused and not forming sentences, she knows what she wants to say but cannot say them 2. ONSET: "When did the confusion start?"  (minutes, hours, days)     Unknown, pt was slurring words 2 days ago at a minimum, mother believes the LKWT was 6 days ago. 3. PATTERN "Does this come and go, or has it been constant since it started?"  "Is it present now?"     Present now  Protocols used: Confusion - Delirium-A-AH

## 2023-04-12 NOTE — H&P (Signed)
History and Physical    Brittany [name] ZOX:[id number] DOB: [birth year] DOA: 04/12/2023  PCP: Clayborne Dana, NP   Patient coming from: Home    Chief Complaint: Altered mentation, slurred speech  HPI: Brittany [name] is a 58 y.o. female with medical history significant of stroke, hypertension, hyperlipidemia, obesity who presented from home with complaint of difficulty with her speech.  Patient history of prior cerebellar stroke.  But at baseline she is alert and oriented, lives alone , not much residual deficits,ambulates witht he help of cane.  Her mother helps her at home.  Her mother has noticed changes in his speech for last 2 weeks.  His speech appeared slurred.  And she developed inability to identify objects.  No history of weakness on any particular extremities or facial asymmetry or loss of sensation.  Her mother also reported that she became forgetful and confused, cognitively declined.  She was brought to [facility].  CT angiogram of the head and neck done at  [facility] showed areas of hypodensity in the left cerebral hemisphere, new from the prior exam , concerning for acute to subacute infarcts.  Also showed subsegmental occlusion of distal MCA branches.  Patient was transferred to [facility] for further workup with MRI and neurology evaluation. On presentation, she was overall hemodynamically stable, mildly hypertensive.  Lab work showed potassium of 3.3, creatinine of 1.1, lipase of 78.  UDS positive for THC.  Patient admitted for further stroke workup. Patient seen and examined bedside in the emergency department.  During my evaluation, she was hypertensive with systolic blood pressure in the range of 160.  She was alert and awake, follows commands well but has word finding difficulty and some component of slurred speech.  No facial asymmetry.  She has generalized weakness.  She is visibly frustrated due to her word finding problem.  She denied any chest pain,  shortness of breath, fever, chills, abdominal pain, nausea, vomiting, dysuria, hematochezia or melena.  ED Course: Mildly hypertensive in the ED.  Lab works as above.  CT as above.  MRI pending.  Review of Systems: As per HPI otherwise 10 point review of systems negative.    Past Medical History:  Diagnosis Date   Anemia    Arthritis    trigger finger in left hand   Complication of anesthesia    per pt, hard to wake up!   Elevated cholesterol    Female bladder prolapse    per urologist, does not have prolaspe   GERD (gastroesophageal reflux disease)    Heart murmur    pt unsure.   Hiatal hernia    Hypertension    IBS (irritable bowel syndrome)    Multiple myeloma (HCC) 2005   had partial chemo   Peripheral neuropathy    SOB (shortness of breath) on exertion    uses an inhaler   Stroke [facility]) 2017   paralysis left arm/uses a walker    Past Surgical History:  Procedure Laterality Date   BONE BIOPSY  2005   in her back   COLONOSCOPY     over 10 years x3   ESOPHAGOGASTRODUODENOSCOPY     incomplete-over 10 years ago    TEE WITHOUT CARDIOVERSION N/A 12/08/2016   Procedure: TRANSESOPHAGEAL ECHOCARDIOGRAM (TEE);  Surgeon: Quintella Reichert, MD;  Location: [facility] ENDOSCOPY;  Service: Cardiovascular;  Laterality: N/A;   TUBAL LIGATION     UPPER GASTROINTESTINAL ENDOSCOPY     WISDOM TOOTH EXTRACTION  reports that she quit smoking about 7 years ago. Her smoking use included cigarettes. She has never used smokeless tobacco. She reports that she does not currently use alcohol. She reports that she does not currently use drugs after having used the following drugs: Marijuana.  Allergies  Allergen Reactions   Amitriptyline Other (See Comments)    Patient reported that it made her throat feel like its locking up and it also caused issues with her going to the bathroom   Duloxetine Other (See Comments)    Patient reported that it made her throat lock up and it caused her to have  issue with going to the bathroom   Naproxen     Vomiting, sweating, abd spasms   Other     States can't take pain meds that end in "cet" or meds that end in "ine" Darvocet/severe vomiting   Darvon [Propoxyphene] Nausea And Vomiting   Hydrocodone Nausea And Vomiting   Lactose Intolerance (Gi)     Bloating, gas, abd pain   Latex Itching and Rash   Oxycodone Nausea And Vomiting   Percocet [Oxycodone-Acetaminophen] Nausea And Vomiting   Topamax [Topiramate]     Memory made her emotional     Family History  Problem Relation Age of Onset   Hypertension Mother    Sarcoidosis Mother        currently in remission    Diverticulitis Mother    Irritable bowel syndrome Mother    Liver cancer Mother    Hypertension Father    Stomach cancer Father    Congestive Heart Failure Father    Stroke Maternal Uncle    Scoliosis Brother    Colon cancer Neg Hx    Esophageal cancer Neg Hx    Colon polyps Neg Hx    Rectal cancer Neg Hx      Prior to Admission medications   Medication Sig Start Date End Date Taking? Authorizing Provider  albuterol (VENTOLIN HFA) 108 (90 Base) MCG/ACT inhaler Inhale 2 puffs into the lungs every 4 (four) hours as needed for wheezing or shortness of breath. 02/24/22   Ivonne Andrew, NP  amLODipine (NORVASC) 10 MG tablet Take 1 tablet (10 mg total) by mouth daily. 12/09/22 01/08/23  Arrien, York Ram, MD  atorvastatin (LIPITOR) 80 MG tablet Take 1 tablet (80 mg total) by mouth daily. 07/06/22 07/06/23  Clayborne Dana, NP  chlorthalidone (HYGROTON) 25 MG tablet Take 1 tablet (25 mg total) by mouth daily. 12/09/22 01/08/23  Arrien, York Ram, MD  Cholecalciferol (VITAMIN D3) 10 MCG (400 UNIT) tablet Take 800 Units by mouth daily.    [provider]  cyanocobalamin 2000 MCG tablet Take 1 tablet (2,000 mcg total) by mouth daily. Patient not taking: Reported on 12/12/2022 08/10/22   Hyman Hopes B, NP  gabapentin (NEURONTIN) 300 MG capsule Take 1 capsule  (300 mg total) by mouth 3 (three) times daily. 07/07/22   Clayborne Dana, NP  hydrALAZINE (APRESOLINE) 50 MG tablet Take 1 tablet (50 mg total) by mouth every 8 (eight) hours. 12/09/22 01/08/23  Arrien, York Ram, MD  hydrOXYzine (VISTARIL) 50 MG capsule Take 50 mg by mouth daily as needed for itching. Patient not taking: Reported on 12/12/2022    [provider]  linaclotide Karlene Einstein) 72 MCG capsule Take 1 capsule (72 mcg total) by mouth daily before breakfast. Patient not taking: Reported on 12/25/2022 07/26/22   Clayborne Dana, NP  losartan (COZAAR) 50 MG tablet Take 1 tablet (50 mg total)  by mouth daily. 12/25/22 01/24/23  Clayborne Dana, NP  methocarbamol (ROBAXIN) 500 MG tablet Take 1 tablet (500 mg total) by mouth every 8 (eight) hours as needed for muscle spasms. Patient not taking: Reported on 12/12/2022 12/09/22   Arrien, York Ram, MD  omeprazole (PRILOSEC) 20 MG capsule Take 1 capsule (20 mg total) by mouth 2 (two) times daily before a meal. 02/24/22 02/24/23  Ivonne Andrew, NP  triamcinolone cream (KENALOG) 0.1 % APPLY 1 APPLICATION TOPICALLY 2 (TWO) TIMES DAILY. Patient taking differently: Apply 1 Application topically daily as needed (for dryness and itching). 11/09/21   Ivonne Andrew, NP    Physical Exam: Vitals:   04/12/23 1336 04/12/23 1500 04/12/23 1545 04/12/23 1705  BP: (!) 178/93  (!) 149/95 (!) 160/76  Pulse: 85 81  83  Resp: 18   18  Temp:    99.3 F (37.4 C)  TempSrc:    Oral  SpO2: 100% 100%  100%    Constitutional: NAD, calm, comfortable Vitals:   04/12/23 1336 04/12/23 1500 04/12/23 1545 04/12/23 1705  BP: (!) 178/93  (!) 149/95 (!) 160/76  Pulse: 85 81  83  Resp: 18   18  Temp:    99.3 F (37.4 C)  TempSrc:    Oral  SpO2: 100% 100%  100%   Eyes: PERRL, lids and conjunctivae normal ENMT: Mucous membranes are moist.  Neck: normal, supple, no masses, no thyromegaly Respiratory: clear to auscultation bilaterally, no wheezing, no crackles.  Normal respiratory effort. No accessory muscle use.  Cardiovascular: Regular rate and rhythm, no murmurs / rubs / gallops. No extremity edema.  Abdomen: no tenderness, no masses palpated. No hepatosplenomegaly. Bowel sounds positive.  Musculoskeletal: no clubbing / cyanosis. No joint deformity upper and lower extremities.  Skin: no rashes, lesions, ulcers. No induration Neurologic: CN 2-12 grossly intact.  Generalized weakness in all extremities.  Follows commands.  Word finding difficulty, slight dysarthria with slurred speech.  Alert and mostly oriented .   Foley Catheter:None  Labs on Admission: I have personally reviewed following labs and imaging studies  CBC: Recent Labs  Lab 04/12/23 1255  WBC 6.7  NEUTROABS 3.7  HGB 15.0  HCT 45.2  MCV 87.9  PLT 176   Basic Metabolic Panel: Recent Labs  Lab 04/12/23 1255  NA 135  K 3.3*  CL 101  CO2 20*  GLUCOSE 161*  BUN 12  CREATININE 1.11*  CALCIUM 9.3   GFR: CrCl cannot be calculated (Unknown ideal weight.). Liver Function Tests: Recent Labs  Lab 04/12/23 1255  AST 25  ALT 15  ALKPHOS 123  BILITOT 0.5  PROT 8.2*  ALBUMIN 4.3   Recent Labs  Lab 04/12/23 1255  LIPASE 78*   Recent Labs  Lab 04/12/23 1255  AMMONIA 27   Coagulation Profile: Recent Labs  Lab 04/12/23 1255  INR 1.0   Cardiac Enzymes: No results for input(s): "CKTOTAL", "CKMB", "CKMBINDEX", "TROPONINI" in the last 168 hours. BNP (last 3 results) No results for input(s): "PROBNP" in the last 8760 hours. HbA1C: No results for input(s): "HGBA1C" in the last 72 hours. CBG: Recent Labs  Lab 04/12/23 1255  GLUCAP 166*   Lipid Profile: No results for input(s): "CHOL", "HDL", "LDLCALC", "TRIG", "CHOLHDL", "LDLDIRECT" in the last 72 hours. Thyroid Function Tests: No results for input(s): "TSH", "T4TOTAL", "FREET4", "T3FREE", "THYROIDAB" in the last 72 hours. Anemia Panel: No results for input(s): "VITAMINB12", "FOLATE", "FERRITIN", "TIBC",  "IRON", "RETICCTPCT" in the last 72 hours. Urine analysis:  Component Value Date/Time   COLORURINE YELLOW 04/12/2023 1400   APPEARANCEUR CLEAR 04/12/2023 1400   LABSPEC 1.010 04/12/2023 1400   PHURINE 5.5 04/12/2023 1400   GLUCOSEU NEGATIVE 04/12/2023 1400   HGBUR NEGATIVE 04/12/2023 1400   BILIRUBINUR NEGATIVE 04/12/2023 1400   BILIRUBINUR neg 10/06/2020 1405   KETONESUR NEGATIVE 04/12/2023 1400   PROTEINUR NEGATIVE 04/12/2023 1400   UROBILINOGEN 0.2 10/06/2020 1405   UROBILINOGEN 0.2 05/21/2017 1111   NITRITE NEGATIVE 04/12/2023 1400   LEUKOCYTESUR NEGATIVE 04/12/2023 1400    Radiological Exams on Admission: CT ANGIO HEAD NECK W WO CM Result Date: 04/12/2023 CLINICAL DATA:  Loss of cognitive skills, slurred speech EXAM: CT ANGIOGRAPHY HEAD AND NECK WITH AND WITHOUT CONTRAST TECHNIQUE: Multidetector CT imaging of the head and neck was performed using the standard protocol during bolus administration of intravenous contrast. Multiplanar CT image reconstructions and MIPs were obtained to evaluate the vascular anatomy. Carotid stenosis measurements (when applicable) are obtained utilizing NASCET criteria, using the distal internal carotid diameter as the denominator. RADIATION DOSE REDUCTION: This exam was performed according to the departmental dose-optimization program which includes automated exposure control, adjustment of the mA and/or kV according to patient size and/or use of iterative reconstruction technique. CONTRAST:  75mL OMNIPAQUE IOHEXOL 350 MG/ML SOLN COMPARISON:  No prior CTA available, correlation is made with 03/05/2018 CT head and 12/06/2016 MRA head FINDINGS: CT HEAD FINDINGS Brain: Areas of hypodensity left cerebral hemisphere (series 2, images 17, 19, 21, and 22), which are new from the prior exam and concerning for acute to subacute infarcts. No evidence of acute hemorrhage, mass, mass effect, or midline shift. No hydrocephalus or extra-axial fluid collection. Vascular:  No hyperdense vessel. Skull: Negative for fracture or focal lesion. Sinuses/Orbits: No acute finding. Other: The mastoid air cells are well aerated. CTA NECK FINDINGS Aortic arch: Two-vessel arch with a common origin of the brachiocephalic and left common carotid arteries. Imaged portion shows no evidence of aneurysm or dissection. No significant stenosis of the major arch vessel origins. Right carotid system: No evidence of dissection, occlusion, or hemodynamically significant stenosis (greater than 50%). Left carotid system: No evidence of dissection, occlusion, or hemodynamically significant stenosis (greater than 50%). Vertebral arteries: No evidence of dissection, occlusion, or hemodynamically significant stenosis (greater than 50%). Skeleton: No acute osseous abnormality. Degenerative changes in the cervical spine. Other neck: 3 mm hypoenhancing focus in the right thyroid lobe, for which no follow-up is currently indicated. Upper chest: No focal pulmonary opacity or pleural effusion. Review of the MIP images confirms the above findings CTA HEAD FINDINGS Evaluation is somewhat limited by venous contamination. Anterior circulation: Both internal carotid arteries are patent to the termini, without significant stenosis. A1 segments patent. Normal anterior communicating artery. Anterior cerebral arteries are patent to their distal aspects without significant stenosis. Short segment occlusion or near occlusive stenosis in left M1 (series 605, images 126-128), with possible reconstitution in the proximal more posterior left M2 (series 605, image 126). The anterior M2 is not well seen. Additional severe stenosis in the insular left M2 (series 605, image 121 and 124), with overall poor opacification distal left MCA branches. Mild stenosis in the proximal right M 1 (series 605, image 129). Right MCA branches are irregular but grossly perfused without high-grade stenosis. Posterior circulation: Vertebral arteries patent  to the vertebrobasilar junction without significant stenosis. Basilar patent to its distal aspect without significant stenosis. Superior cerebellar arteries patent proximally. Patent P1 segments. PCAs perfused to their distal aspects without significant stenosis. The  bilateral posterior communicating arteries are not visualized. Venous sinuses: Well opacified, patent. Anatomic variants: None significant. No evidence of aneurysm or vascular malformation. Review of the MIP images confirms the above findings IMPRESSION: 1. Areas of hypodensity in the left cerebral hemisphere, which are new from the prior exam and concerning for acute to subacute infarcts. An MRI is recommended for further evaluation. 2. 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 The anterior M2 is not well seen. Additional severe stenosis in the insular left M2, with overall poor opacification of distal left MCA branches. 3. No hemodynamically significant stenosis in the neck. These results were called by telephone at the time of interpretation on 04/12/2023 at 2:53 pm to provider Edwin Dada , who verbally acknowledged these results. Electronically Signed   By: Wiliam Ke M.D.   On: 04/12/2023 14:54     Assessment/Plan Principal Problem:   Acute ischemic stroke [facility]) Active Problems:   Essential hypertension   Hypokalemia   GERD (gastroesophageal reflux disease)   History of CVA (cerebrovascular accident)   Acute ischemic stroke: Presented  with  speech changes(mainly word finding difficulty), some slurred speech, confusion for last 2 weeks.  CT head and neck showed areas of hypodensity in the left cerebral hemisphere, new from the prior exam , concerning for acute to subacute infarcts.  Also showed subsegmental occlusion of distal MCA branches.  Patient was transferred to [facility] for further workup with MRI and neurology evaluation. Pending MRI.  Stroke workup  initiated.  PT/OT/speech consulted, echo, A1c, lipid panel pending. Patient has history of prior stroke but without significant residual deficits.  Not taking any antiplatelet agents at home.  Ambulates with the help of walker.  History of hypertension: Takes amlodipine, losartan, hydralazine at home.  This medicines are currently on hold.  Allow permissive hypertension.  Gradually normalize blood pressure and 3 to 5 days.  Continue as needed medication for severe hypertension  Hyperlipidemia: Takes Lipitor at home.  Currently been continued  Hypokalemia: Supplemented with potassium  Mild AKI: Creatinine 1.1.  Continue gentle iv fluid for today  GERD: Continue Protonix  Marijuana use: UDS positive for THC     Severity of Illness: The appropriate patient status for this patient is OBSERVATION.   DVT prophylaxis: Lovenox Code Status: Full code Family Communication: None at the bedside Consults called: Neuro     Burnadette Pop MD [facility]  04/12/2023, 5:17 PM

## 2023-04-12 NOTE — ED Notes (Signed)
Reports called to [place], CN RN

## 2023-04-12 NOTE — ED Notes (Signed)
Report rec'd from prev RN 

## 2023-04-12 NOTE — ED Notes (Signed)
MRI updated. Waiting on parents to arrive. MRI spoke with parents. Parents expected around 1800. Pt alert, NAD, calm, interactive, no changes.

## 2023-04-12 NOTE — ED Notes (Signed)
This nurse called [facility], and they stated they are ready for the patient. Transport contacted.

## 2023-04-12 NOTE — ED Notes (Addendum)
Parents arriving to [facility] now, MRI notified.

## 2023-04-12 NOTE — ED Notes (Signed)
[facility] on the floor, paperwork given

## 2023-04-12 NOTE — Plan of Care (Signed)
On-call neurology note  Received a call from Dr. Wallace Cullens at [facility]. Patient with multiple days maybe almost a week or 2 worth of slurred speech, word finding difficulty and cognitive decline, noted to have subacute appearing strokes on her CT head in the left MCA territory with severe stenosis and possible short segment occlusion and distal MCA branches. Has a history of prior cerebellar stroke without much residual deficits. Given the multiple days, maybe multiple weeks last known well, not a candidate for acute intervention at this time. She should be admitted to the [facility] hospitalist service, and please call neurology when she arrives there for a formal inpatient consultation.  -- Milon Dikes, MD Neurologist [facility]

## 2023-04-12 NOTE — ED Notes (Signed)
ED TO INPATIENT HANDOFF REPORT  ED Nurse Name and Phone #: Renita Papa, RN  S Name/Age/Gender Brittany [name] 58 y.o. female Room/Bed: 046C/046C  Code Status   Code Status: Prior  Home/SNF/Other Home Patient oriented to: self Is this baseline? No   Triage Complete: Triage complete  Chief Complaint Acute ischemic stroke [facility]) [I63.9]  Triage Note Pt is brought in by her parents due to "loss of cognitive skills" over the past 1-2 weeks but initially this was mild with a slurring of her speech over the past 2 weeks (sounded like "she had powder in her mouth when speaking").  Worsening cognitive decline over the past 2 days.  Per her mother she has been forgetting passwords, having difficulty getting her words out.   Allergies Allergies  Allergen Reactions   Amitriptyline Other (See Comments)    Patient reported that it made her throat feel like its locking up and it also caused issues with her going to the bathroom   Duloxetine Other (See Comments)    Patient reported that it made her throat lock up and it caused her to have issue with going to the bathroom   Naproxen     Vomiting, sweating, abd spasms   Other     States can't take pain meds that end in "cet" or meds that end in "ine" Darvocet/severe vomiting   Beef-Derived Drug Products     Patient has IBS prefers no beef   Pork-Derived Products     Patient has IBS prefers no pork   Darvon [Propoxyphene] Nausea And Vomiting   Hydrocodone Nausea And Vomiting   Lactose Intolerance (Gi)     Bloating, gas, abd pain   Latex Itching and Rash   Oxycodone Nausea And Vomiting   Percocet [Oxycodone-Acetaminophen] Nausea And Vomiting   Topamax [Topiramate]     Memory made her emotional     Level of Care/Admitting Diagnosis ED Disposition     ED Disposition  Admit   Condition  --   Comment  Hospital Area: [facility] [[id number]]  Level of Care: Telemetry Medical [104]  May place patient in  observation at [facility] or [facility] if equivalent level of care is available:: No  Covid Evaluation: Confirmed COVID Negative  Diagnosis: Acute ischemic stroke [facility]) [[id number]]  Admitting Physician: Burnadette Pop [[id number]]  Attending Physician: Burnadette Pop [[id number]]          B Medical/Surgery History Past Medical History:  Diagnosis Date   Anemia    Arthritis    trigger finger in left hand   Complication of anesthesia    per pt, hard to wake up!   Elevated cholesterol    Female bladder prolapse    per urologist, does not have prolaspe   GERD (gastroesophageal reflux disease)    Heart murmur    pt unsure.   Hiatal hernia    Hypertension    IBS (irritable bowel syndrome)    Multiple myeloma (HCC) 2005   had partial chemo   Peripheral neuropathy    SOB (shortness of breath) on exertion    uses an inhaler   Stroke [facility]) 2017   paralysis left arm/uses a walker   Past Surgical History:  Procedure Laterality Date   BONE BIOPSY  2005   in her back   COLONOSCOPY     over 10 years x3   ESOPHAGOGASTRODUODENOSCOPY     incomplete-over 10 years ago    TEE WITHOUT CARDIOVERSION N/A 12/08/2016   Procedure:  TRANSESOPHAGEAL ECHOCARDIOGRAM (TEE);  Surgeon: Quintella Reichert, MD;  Location: [facility] ENDOSCOPY;  Service: Cardiovascular;  Laterality: N/A;   TUBAL LIGATION     UPPER GASTROINTESTINAL ENDOSCOPY     WISDOM TOOTH EXTRACTION       A IV Location/Drains/Wounds Patient Lines/Drains/Airways Status     Active Line/Drains/Airways     Name Placement date Placement time Site Days   Peripheral IV 04/12/23 20 G Right Antecubital 04/12/23  1541  Antecubital  less than 1            Intake/Output Last 24 hours  Intake/Output Summary (Last 24 hours) at 04/12/2023 2016 Last data filed at 04/12/2023 1811 Gross per 24 hour  Intake 500 ml  Output --  Net 500 ml    Labs/Imaging Results for orders placed or performed during the hospital encounter of 04/12/23 (from the  past 48 hours)  CBC with Differential     Status: Abnormal   Collection Time: 04/12/23 12:55 PM  Result Value Ref Range   WBC 6.7 4.0 - 10.5 K/uL   RBC 5.14 (H) 3.87 - 5.11 MIL/uL   Hemoglobin 15.0 12.0 - 15.0 g/dL   HCT 69.6 29.5 - 28.4 %   MCV 87.9 80.0 - 100.0 fL   MCH 29.2 26.0 - 34.0 pg   MCHC 33.2 30.0 - 36.0 g/dL   RDW 13.2 44.0 - 10.2 %   Platelets 176 150 - 400 K/uL   nRBC 0.0 0.0 - 0.2 %   Neutrophils Relative % 55 %   Neutro Abs 3.7 1.7 - 7.7 K/uL   Lymphocytes Relative 36 %   Lymphs Abs 2.4 0.7 - 4.0 K/uL   Monocytes Relative 7 %   Monocytes Absolute 0.5 0.1 - 1.0 K/uL   Eosinophils Relative 2 %   Eosinophils Absolute 0.1 0.0 - 0.5 K/uL   Basophils Relative 0 %   Basophils Absolute 0.0 0.0 - 0.1 K/uL   Immature Granulocytes 0 %   Abs Immature Granulocytes 0.02 0.00 - 0.07 K/uL    Comment: Performed at [facility], [address] [place], [place] [address]  Comprehensive metabolic panel     Status: Abnormal   Collection Time: 04/12/23 12:55 PM  Result Value Ref Range   Sodium 135 135 - 145 mmol/L   Potassium 3.3 (L) 3.5 - 5.1 mmol/L   Chloride 101 98 - 111 mmol/L   CO2 20 (L) 22 - 32 mmol/L   Glucose, Bld 161 (H) 70 - 99 mg/dL    Comment: Glucose reference range applies only to samples taken after fasting for at least 8 hours.   BUN 12 6 - 20 mg/dL   Creatinine, Ser 6.44 (H) 0.44 - 1.00 mg/dL   Calcium 9.3 8.9 - 03.4 mg/dL   Total Protein 8.2 (H) 6.5 - 8.1 g/dL   Albumin 4.3 3.5 - 5.0 g/dL   AST 25 15 - 41 U/L   ALT 15 0 - 44 U/L   Alkaline Phosphatase 123 38 - 126 U/L   Total Bilirubin 0.5 0.0 - 1.2 mg/dL   GFR, Estimated 58 (L) >60 mL/min    Comment: (NOTE) Calculated using the CKD-EPI Creatinine Equation (2021)    Anion gap 14 5 - 15    Comment: Performed at [facility], [address] [place], [place] [address]  Lipase, blood     Status: Abnormal   Collection Time: 04/12/23 12:55 PM  Result Value Ref Range   Lipase 78 (H)  11 - 51 U/L    Comment: Performed at [facility], [address] [place], [place] [address]  Ammonia     Status: None   Collection Time: 04/12/23 12:55 PM  Result Value Ref Range   Ammonia 27 9 - 35 umol/L    Comment: Performed at [facility], [address] [place], [place] [address]  Rapid urine drug screen (hospital performed)     Status: Abnormal   Collection Time: 04/12/23 12:55 PM  Result Value Ref Range   Opiates NONE DETECTED NONE DETECTED   Cocaine NONE DETECTED NONE DETECTED   Benzodiazepines NONE DETECTED NONE DETECTED   Amphetamines NONE DETECTED NONE DETECTED   Tetrahydrocannabinol POSITIVE (A) NONE DETECTED   Barbiturates NONE DETECTED NONE DETECTED    Comment: (NOTE) DRUG SCREEN FOR MEDICAL PURPOSES ONLY.  IF CONFIRMATION IS NEEDED FOR ANY PURPOSE, NOTIFY LAB WITHIN 5 DAYS.  LOWEST DETECTABLE LIMITS FOR URINE DRUG SCREEN Drug Class                     Cutoff (ng/mL) Amphetamine and metabolites    1000 Barbiturate and metabolites    200 Benzodiazepine                 200 Opiates and metabolites        300 Cocaine and metabolites        300 THC                            50 Performed at [facility], [address] [place], [place] [address]   Acetaminophen level     Status: Abnormal   Collection Time: 04/12/23 12:55 PM  Result Value Ref Range   Acetaminophen (Tylenol), Serum <10 (L) 10 - 30 ug/mL    Comment: (NOTE) Therapeutic concentrations vary significantly. A range of 10-30 ug/mL  may be an effective concentration for many patients. However, some  are best treated at concentrations outside of this range. Acetaminophen concentrations >150 ug/mL at 4 hours after ingestion  and >50 ug/mL at 12 hours after ingestion are often associated with  toxic reactions.  Performed at [facility], [address] [place], [place] [address]   Salicylate level     Status: Abnormal   Collection Time: 04/12/23 12:55 PM   Result Value Ref Range   Salicylate Lvl <7.0 (L) 7.0 - 30.0 mg/dL    Comment: Performed at [facility], [address] [place], [place] [address]  POC CBG, ED     Status: Abnormal   Collection Time: 04/12/23 12:55 PM  Result Value Ref Range   Glucose-Capillary 166 (H) 70 - 99 mg/dL    Comment: Glucose reference range applies only to samples taken after fasting for at least 8 hours.  Protime-INR     Status: None   Collection Time: 04/12/23 12:55 PM  Result Value Ref Range   Prothrombin Time 13.3 11.4 - 15.2 seconds   INR 1.0 0.8 - 1.2    Comment: (NOTE) INR goal varies based on device and disease states. Performed at [facility], [address] [place], [place] [address]   Urinalysis, Routine w reflex microscopic -Urine, Clean Catch     Status: None   Collection Time: 04/12/23  2:00 PM  Result Value Ref Range   Color, Urine YELLOW YELLOW   APPearance CLEAR  CLEAR   Specific Gravity, Urine 1.010 1.005 - 1.030   pH 5.5 5.0 - 8.0   Glucose, UA NEGATIVE NEGATIVE mg/dL   Hgb urine dipstick NEGATIVE NEGATIVE   Bilirubin Urine NEGATIVE NEGATIVE   Ketones, ur NEGATIVE NEGATIVE mg/dL   Protein, ur NEGATIVE NEGATIVE mg/dL   Nitrite NEGATIVE NEGATIVE   Leukocytes,Ua NEGATIVE NEGATIVE    Comment: Microscopic not done on urines with negative protein, blood, leukocytes, nitrite, or glucose < 500 mg/dL. Performed at [facility], [address] [place], [place] [address]    MR Brain Wo Contrast (neuro protocol) Result Date: 04/12/2023 CLINICAL DATA:  Initial evaluation for acute neuro deficit, stroke suspected. EXAM: MRI HEAD WITHOUT CONTRAST TECHNIQUE: Multiplanar, multiecho pulse sequences of the brain and surrounding structures were obtained without intravenous contrast. COMPARISON:  Prior CT from earlier the same day. FINDINGS: Brain: Cerebral volume within normal limits. No significant cerebral white matter disease for age. Chronic left cerebellar  infarct noted. Scattered foci of restricted diffusion are seen involving the left cerebral hemisphere, corresponding with hypodensity seen on prior CT. Findings consistent with acute to early subacute ischemic infarcts, left MCA distribution. No associated hemorrhage or significant mass effect. There are a few underlying late subacute to chronic infarcts superimposed on this region as well (series 5, image 81, 79 for example). No other evidence for acute or subacute ischemia. No acute intracranial hemorrhage. No mass lesion or midline shift. No hydrocephalus or extra-axial fluid collection. Pituitary gland within normal limits. Vascular: Poor visualization of the left MCA branches, corresponding with attenuated flow noted on prior CT. Major intracranial vascular flow voids are otherwise maintained. Skull and upper cervical spine: Craniocervical junction within normal limits. Bone marrow signal intensity normal. No scalp soft tissue abnormality. Sinuses/Orbits: Globes orbital soft tissues demonstrate no acute finding. Paranasal sinuses are clear. No mastoid effusion. Other: Few subcentimeter T2 hyperintense nodular densities noted about the parotid glands, nonspecific, but possibly small intraparotid lymph nodes. IMPRESSION: 1. Scattered acute to early subacute ischemic infarcts involving the left cerebral hemisphere, left MCA distribution. No associated hemorrhage or significant mass effect. 2. Few scattered superimposed late subacute to chronic left MCA distribution infarcts. 3. Chronic left cerebellar infarct. Electronically Signed   By: Rise Mu M.D.   On: 04/12/2023 19:28   CT ANGIO HEAD NECK W WO CM Result Date: 04/12/2023 CLINICAL DATA:  Loss of cognitive skills, slurred speech EXAM: CT ANGIOGRAPHY HEAD AND NECK WITH AND WITHOUT CONTRAST TECHNIQUE: Multidetector CT imaging of the head and neck was performed using the standard protocol during bolus administration of intravenous contrast.  Multiplanar CT image reconstructions and MIPs were obtained to evaluate the vascular anatomy. Carotid stenosis measurements (when applicable) are obtained utilizing NASCET criteria, using the distal internal carotid diameter as the denominator. RADIATION DOSE REDUCTION: This exam was performed according to the departmental dose-optimization program which includes automated exposure control, adjustment of the mA and/or kV according to patient size and/or use of iterative reconstruction technique. CONTRAST:  75mL OMNIPAQUE IOHEXOL 350 MG/ML SOLN COMPARISON:  No prior CTA available, correlation is made with 03/05/2018 CT head and 12/06/2016 MRA head FINDINGS: CT HEAD FINDINGS Brain: Areas of hypodensity left cerebral hemisphere (series 2, images 17, 19, 21, and 22), which are new from the prior exam and concerning for acute to subacute infarcts. No evidence of acute hemorrhage, mass, mass effect, or midline shift. No hydrocephalus or extra-axial fluid collection. Vascular: No hyperdense vessel. Skull: Negative for fracture or focal lesion. Sinuses/Orbits: No  acute finding. Other: The mastoid air cells are well aerated. CTA NECK FINDINGS Aortic arch: Two-vessel arch with a common origin of the brachiocephalic and left common carotid arteries. Imaged portion shows no evidence of aneurysm or dissection. No significant stenosis of the major arch vessel origins. Right carotid system: No evidence of dissection, occlusion, or hemodynamically significant stenosis (greater than 50%). Left carotid system: No evidence of dissection, occlusion, or hemodynamically significant stenosis (greater than 50%). Vertebral arteries: No evidence of dissection, occlusion, or hemodynamically significant stenosis (greater than 50%). Skeleton: No acute osseous abnormality. Degenerative changes in the cervical spine. Other neck: 3 mm hypoenhancing focus in the right thyroid lobe, for which no follow-up is currently indicated. Upper chest: No  focal pulmonary opacity or pleural effusion. Review of the MIP images confirms the above findings CTA HEAD FINDINGS Evaluation is somewhat limited by venous contamination. Anterior circulation: Both internal carotid arteries are patent to the termini, without significant stenosis. A1 segments patent. Normal anterior communicating artery. Anterior cerebral arteries are patent to their distal aspects without significant stenosis. Short segment occlusion or near occlusive stenosis in left M1 (series 605, images 126-128), with possible reconstitution in the proximal more posterior left M2 (series 605, image 126). The anterior M2 is not well seen. Additional severe stenosis in the insular left M2 (series 605, image 121 and 124), with overall poor opacification distal left MCA branches. Mild stenosis in the proximal right M 1 (series 605, image 129). Right MCA branches are irregular but grossly perfused without high-grade stenosis. Posterior circulation: Vertebral arteries patent to the vertebrobasilar junction without significant stenosis. Basilar patent to its distal aspect without significant stenosis. Superior cerebellar arteries patent proximally. Patent P1 segments. PCAs perfused to their distal aspects without significant stenosis. The bilateral posterior communicating arteries are not visualized. Venous sinuses: Well opacified, patent. Anatomic variants: None significant. No evidence of aneurysm or vascular malformation. Review of the MIP images confirms the above findings IMPRESSION: 1. Areas of hypodensity in the left cerebral hemisphere, which are new from the prior exam and concerning for acute to subacute infarcts. An MRI is recommended for further evaluation. 2. 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 The anterior M2 is not well seen. Additional severe stenosis in the insular left M2, with overall poor opacification of distal left  MCA branches. 3. No hemodynamically significant stenosis in the neck. These results were called by telephone at the time of interpretation on 04/12/2023 at 2:53 pm to provider Edwin Dada , who verbally acknowledged these results. Electronically Signed   By: Wiliam Ke M.D.   On: 04/12/2023 14:54    Pending Labs Unresulted Labs (From admission, onward)     Start     Ordered   04/13/23 0500  Lipid panel  (Labs)  Tomorrow morning,   R       Comments: Fasting    04/12/23 1715   04/13/23 0500  CBC  Tomorrow morning,   R        04/12/23 1716   04/13/23 0500  Basic metabolic panel  Tomorrow morning,   R        04/12/23 1716   04/12/23 1716  Hemoglobin A1c  Once,   R        04/12/23 1716            Vitals/Pain Today's Vitals   04/12/23 1730 04/12/23 1745 04/12/23 1800 04/12/23 1815  BP: (!) 160/69 (!) 183/85 Marland Kitchen)  194/94 (!) 192/95  Pulse: 74 90 79 74  Resp: 17 15 15 11   Temp:   (!) 97.5 F (36.4 C)   TempSrc:   Oral   SpO2: 100% 100% 100% 100%  PainSc:        Isolation Precautions No active isolations  Medications Medications  atorvastatin (LIPITOR) tablet 80 mg (has no administration in time range)  pantoprazole (PROTONIX) EC tablet 40 mg (has no administration in time range)  cholecalciferol (VITAMIN D3) 10 MCG (400 UNIT) tablet 800 Units (has no administration in time range)  gabapentin (NEURONTIN) capsule 300 mg (has no administration in time range)   stroke: early stages of recovery book (has no administration in time range)  aspirin chewable tablet 81 mg (81 mg Oral Given 04/12/23 1810)  0.9 %  sodium chloride infusion ( Intravenous New Bag/Given 04/12/23 2008)  labetalol (NORMODYNE) injection 10 mg (10 mg Intravenous Given 04/12/23 1810)  enoxaparin (LOVENOX) injection 40 mg (has no administration in time range)  iohexol (OMNIPAQUE) 350 MG/ML injection 75 mL (75 mLs Intravenous Contrast Given 04/12/23 1346)  potassium chloride 10 mEq in 100 mL IVPB (0 mEq Intravenous  Stopped 04/12/23 1702)  potassium chloride SA (KLOR-CON M) CR tablet 40 mEq (40 mEq Oral Given 04/12/23 1810)    Mobility walks with device     Focused Assessments Neuro Assessment Handoff:  Swallow screen pass? Yes  Cardiac Rhythm: Normal sinus rhythm (HR 83) NIH Stroke Scale  Dizziness Present: No Headache Present: No Interval: Shift assessment Level of Consciousness (1a.)   : Alert, keenly responsive LOC Questions (1b. )   : Answers one question correctly LOC Commands (1c. )   : Performs both tasks correctly Best Gaze (2. )  : Normal Visual (3. )  : No visual loss Facial Palsy (4. )    : Normal symmetrical movements Motor Arm, Left (5a. )   : No drift Motor Arm, Right (5b. ) : No drift Motor Leg, Left (6a. )  : Some effort against gravity Motor Leg, Right (6b. ) : No effort against gravity Limb Ataxia (7. ): Absent Sensory (8. )  : Normal, no sensory loss Best Language (9. )  : Severe aphasia Dysarthria (10. ): Mild-to-moderate dysarthria, patient slurs at least some words and, at worst, can be understood with some difficulty Extinction/Inattention (11.)   : No Abnormality Complete NIHSS TOTAL: 9 Last date known well: 03/29/23   Neuro Assessment: Exceptions to [facility] Neuro Checks:   Initial (04/12/23 1255)  Has TPA been given? No If patient is a Neuro Trauma and patient is going to OR before floor call report to 4N Charge nurse: [phone] or [phone]   R Recommendations: See Admitting Provider Note  Report given to:   Additional Notes:

## 2023-04-12 NOTE — Telephone Encounter (Signed)
FYI  Copied from CRM [id number]. Topic: Clinical - Medical Advice >> Apr 12, 2023  3:12 PM Brittany [name] wrote: Reason for CRM: patient will be admitted at [facility] had a stroke yesterday

## 2023-04-13 ENCOUNTER — Ambulatory Visit: Payer: Medicaid Other | Admitting: Family Medicine

## 2023-04-13 ENCOUNTER — Observation Stay (HOSPITAL_COMMUNITY): Payer: Medicaid Other

## 2023-04-13 DIAGNOSIS — I69334 Monoplegia of upper limb following cerebral infarction affecting left non-dominant side: Secondary | ICD-10-CM | POA: Diagnosis not present

## 2023-04-13 DIAGNOSIS — I6522 Occlusion and stenosis of left carotid artery: Secondary | ICD-10-CM | POA: Diagnosis not present

## 2023-04-13 DIAGNOSIS — Z4682 Encounter for fitting and adjustment of non-vascular catheter: Secondary | ICD-10-CM | POA: Diagnosis not present

## 2023-04-13 DIAGNOSIS — E669 Obesity, unspecified: Secondary | ICD-10-CM | POA: Diagnosis present

## 2023-04-13 DIAGNOSIS — I878 Other specified disorders of veins: Secondary | ICD-10-CM | POA: Diagnosis not present

## 2023-04-13 DIAGNOSIS — E1140 Type 2 diabetes mellitus with diabetic neuropathy, unspecified: Secondary | ICD-10-CM | POA: Diagnosis present

## 2023-04-13 DIAGNOSIS — R410 Disorientation, unspecified: Secondary | ICD-10-CM | POA: Diagnosis not present

## 2023-04-13 DIAGNOSIS — E785 Hyperlipidemia, unspecified: Secondary | ICD-10-CM | POA: Diagnosis not present

## 2023-04-13 DIAGNOSIS — Z8249 Family history of ischemic heart disease and other diseases of the circulatory system: Secondary | ICD-10-CM | POA: Diagnosis not present

## 2023-04-13 DIAGNOSIS — D649 Anemia, unspecified: Secondary | ICD-10-CM | POA: Diagnosis not present

## 2023-04-13 DIAGNOSIS — F1210 Cannabis abuse, uncomplicated: Secondary | ICD-10-CM | POA: Diagnosis present

## 2023-04-13 DIAGNOSIS — I639 Cerebral infarction, unspecified: Secondary | ICD-10-CM | POA: Diagnosis not present

## 2023-04-13 DIAGNOSIS — R1031 Right lower quadrant pain: Secondary | ICD-10-CM | POA: Diagnosis not present

## 2023-04-13 DIAGNOSIS — I6389 Other cerebral infarction: Secondary | ICD-10-CM | POA: Diagnosis not present

## 2023-04-13 DIAGNOSIS — I63512 Cerebral infarction due to unspecified occlusion or stenosis of left middle cerebral artery: Secondary | ICD-10-CM | POA: Diagnosis not present

## 2023-04-13 DIAGNOSIS — R930 Abnormal findings on diagnostic imaging of skull and head, not elsewhere classified: Secondary | ICD-10-CM | POA: Diagnosis not present

## 2023-04-13 DIAGNOSIS — G629 Polyneuropathy, unspecified: Secondary | ICD-10-CM | POA: Diagnosis not present

## 2023-04-13 DIAGNOSIS — R4701 Aphasia: Secondary | ICD-10-CM | POA: Diagnosis not present

## 2023-04-13 DIAGNOSIS — K5901 Slow transit constipation: Secondary | ICD-10-CM | POA: Diagnosis not present

## 2023-04-13 DIAGNOSIS — R29818 Other symptoms and signs involving the nervous system: Secondary | ICD-10-CM | POA: Diagnosis not present

## 2023-04-13 DIAGNOSIS — C9000 Multiple myeloma not having achieved remission: Secondary | ICD-10-CM | POA: Diagnosis not present

## 2023-04-13 DIAGNOSIS — G9349 Other encephalopathy: Secondary | ICD-10-CM | POA: Diagnosis not present

## 2023-04-13 DIAGNOSIS — Z87891 Personal history of nicotine dependence: Secondary | ICD-10-CM | POA: Diagnosis not present

## 2023-04-13 DIAGNOSIS — B370 Candidal stomatitis: Secondary | ICD-10-CM | POA: Diagnosis not present

## 2023-04-13 DIAGNOSIS — I69312 Visuospatial deficit and spatial neglect following cerebral infarction: Secondary | ICD-10-CM | POA: Diagnosis not present

## 2023-04-13 DIAGNOSIS — I69322 Dysarthria following cerebral infarction: Secondary | ICD-10-CM | POA: Diagnosis not present

## 2023-04-13 DIAGNOSIS — I69392 Facial weakness following cerebral infarction: Secondary | ICD-10-CM | POA: Diagnosis not present

## 2023-04-13 DIAGNOSIS — I6602 Occlusion and stenosis of left middle cerebral artery: Secondary | ICD-10-CM | POA: Diagnosis not present

## 2023-04-13 DIAGNOSIS — I10 Essential (primary) hypertension: Secondary | ICD-10-CM | POA: Diagnosis not present

## 2023-04-13 DIAGNOSIS — I69351 Hemiplegia and hemiparesis following cerebral infarction affecting right dominant side: Secondary | ICD-10-CM | POA: Diagnosis not present

## 2023-04-13 DIAGNOSIS — I63511 Cerebral infarction due to unspecified occlusion or stenosis of right middle cerebral artery: Secondary | ICD-10-CM | POA: Diagnosis not present

## 2023-04-13 DIAGNOSIS — I69320 Aphasia following cerebral infarction: Secondary | ICD-10-CM | POA: Diagnosis not present

## 2023-04-13 DIAGNOSIS — K589 Irritable bowel syndrome without diarrhea: Secondary | ICD-10-CM | POA: Diagnosis not present

## 2023-04-13 DIAGNOSIS — I69391 Dysphagia following cerebral infarction: Secondary | ICD-10-CM | POA: Diagnosis not present

## 2023-04-13 DIAGNOSIS — Z79899 Other long term (current) drug therapy: Secondary | ICD-10-CM | POA: Diagnosis not present

## 2023-04-13 DIAGNOSIS — E876 Hypokalemia: Secondary | ICD-10-CM | POA: Diagnosis not present

## 2023-04-13 DIAGNOSIS — F54 Psychological and behavioral factors associated with disorders or diseases classified elsewhere: Secondary | ICD-10-CM | POA: Diagnosis not present

## 2023-04-13 DIAGNOSIS — I69398 Other sequelae of cerebral infarction: Secondary | ICD-10-CM | POA: Diagnosis not present

## 2023-04-13 DIAGNOSIS — K581 Irritable bowel syndrome with constipation: Secondary | ICD-10-CM | POA: Diagnosis not present

## 2023-04-13 DIAGNOSIS — Z9221 Personal history of antineoplastic chemotherapy: Secondary | ICD-10-CM | POA: Diagnosis not present

## 2023-04-13 DIAGNOSIS — N179 Acute kidney failure, unspecified: Secondary | ICD-10-CM | POA: Diagnosis not present

## 2023-04-13 DIAGNOSIS — R109 Unspecified abdominal pain: Secondary | ICD-10-CM | POA: Diagnosis not present

## 2023-04-13 DIAGNOSIS — K5649 Other impaction of intestine: Secondary | ICD-10-CM | POA: Diagnosis not present

## 2023-04-13 DIAGNOSIS — G8191 Hemiplegia, unspecified affecting right dominant side: Secondary | ICD-10-CM | POA: Diagnosis present

## 2023-04-13 DIAGNOSIS — E7800 Pure hypercholesterolemia, unspecified: Secondary | ICD-10-CM | POA: Diagnosis not present

## 2023-04-13 DIAGNOSIS — Z781 Physical restraint status: Secondary | ICD-10-CM | POA: Diagnosis not present

## 2023-04-13 DIAGNOSIS — G839 Paralytic syndrome, unspecified: Secondary | ICD-10-CM | POA: Diagnosis present

## 2023-04-13 DIAGNOSIS — Q2112 Patent foramen ovale: Secondary | ICD-10-CM | POA: Diagnosis not present

## 2023-04-13 DIAGNOSIS — I618 Other nontraumatic intracerebral hemorrhage: Secondary | ICD-10-CM | POA: Diagnosis not present

## 2023-04-13 DIAGNOSIS — L7632 Postprocedural hematoma of skin and subcutaneous tissue following other procedure: Secondary | ICD-10-CM | POA: Diagnosis not present

## 2023-04-13 DIAGNOSIS — R7303 Prediabetes: Secondary | ICD-10-CM | POA: Diagnosis not present

## 2023-04-13 DIAGNOSIS — K219 Gastro-esophageal reflux disease without esophagitis: Secondary | ICD-10-CM | POA: Diagnosis not present

## 2023-04-13 DIAGNOSIS — M75101 Unspecified rotator cuff tear or rupture of right shoulder, not specified as traumatic: Secondary | ICD-10-CM | POA: Diagnosis not present

## 2023-04-13 DIAGNOSIS — I82411 Acute embolism and thrombosis of right femoral vein: Secondary | ICD-10-CM | POA: Diagnosis not present

## 2023-04-13 DIAGNOSIS — I70202 Unspecified atherosclerosis of native arteries of extremities, left leg: Secondary | ICD-10-CM | POA: Diagnosis present

## 2023-04-13 DIAGNOSIS — D630 Anemia in neoplastic disease: Secondary | ICD-10-CM | POA: Diagnosis not present

## 2023-04-13 DIAGNOSIS — R414 Neurologic neglect syndrome: Secondary | ICD-10-CM | POA: Diagnosis present

## 2023-04-13 DIAGNOSIS — I69364 Other paralytic syndrome following cerebral infarction affecting left non-dominant side: Secondary | ICD-10-CM | POA: Diagnosis not present

## 2023-04-13 LAB — BASIC METABOLIC PANEL
Anion gap: 8 (ref 5–15)
BUN: 9 mg/dL (ref 6–20)
CO2: 23 mmol/L (ref 22–32)
Calcium: 8.9 mg/dL (ref 8.9–10.3)
Chloride: 109 mmol/L (ref 98–111)
Creatinine, Ser: 1 mg/dL (ref 0.44–1.00)
GFR, Estimated: >60 mL/min (ref >60)
Glucose, Bld: 98 mg/dL (ref 70–99)
Potassium: 3.8 mmol/L (ref 3.5–5.1)
Sodium: 140 mmol/L (ref 135–145)

## 2023-04-13 LAB — LIPID PANEL
Cholesterol: 184 mg/dL (ref 0–200)
HDL: 22 mg/dL — ABNORMAL LOW (ref >40)
LDL Cholesterol: 124 mg/dL — ABNORMAL HIGH (ref 0–99)
Total CHOL/HDL Ratio: 8.4 {ratio}
Triglycerides: 188 mg/dL — ABNORMAL HIGH (ref <150)
VLDL: 38 mg/dL (ref 0–40)

## 2023-04-13 LAB — ECHOCARDIOGRAM COMPLETE
AR max vel: 1.96 cm2
AV Area VTI: 1.58 cm2
AV Area mean vel: 1.41 cm2
AV Mean grad: 3 mm[Hg]
AV Peak grad: 6.6 mm[Hg]
Ao pk vel: 1.28 m/s
Area-P 1/2: 2.59 cm2
Calc EF: 60.3 %
Height: 62 in
MV VTI: 1.44 cm2
S' Lateral: 2.1 cm
Single Plane A2C EF: 63.6 %
Single Plane A4C EF: 69.5 %
Weight: 2574.97 [oz_av]

## 2023-04-13 LAB — CBC
HCT: 39.3 % (ref 36.0–46.0)
Hemoglobin: 13.3 g/dL (ref 12.0–15.0)
MCH: 29.6 pg (ref 26.0–34.0)
MCHC: 33.8 g/dL (ref 30.0–36.0)
MCV: 87.3 fL (ref 80.0–100.0)
Platelets: 158 10*3/uL (ref 150–400)
RBC: 4.5 MIL/uL (ref 3.87–5.11)
RDW: 15 % (ref 11.5–15.5)
WBC: 5.7 10*3/uL (ref 4.0–10.5)
nRBC: 0 % (ref 0.0–0.2)

## 2023-04-13 LAB — HEMOGLOBIN A1C
Hgb A1c MFr Bld: 5.9 % — ABNORMAL HIGH (ref 4.8–5.6)
Mean Plasma Glucose: 122.63 mg/dL

## 2023-04-13 MED ORDER — CLOPIDOGREL BISULFATE 75 MG PO TABS
75.0000 mg | ORAL_TABLET | Freq: Every day | ORAL | Status: DC
  Administered 2023-04-13 – 2023-04-15 (×3): 75 mg via ORAL
  Filled 2023-04-13 (×3): qty 1

## 2023-04-13 NOTE — Progress Notes (Addendum)
PROGRESS NOTE  Brittany [name]  ZOX:[id number] DOB: Nov 17, 1965 DOA: 04/12/2023 PCP: Clayborne Dana, NP   Brief Narrative:  Brittany [name] is a 58 y.o. female with medical history significant of stroke, hypertension, hyperlipidemia, obesity who presented from home with complaint of difficulty with her speech .  CT angiogram of the head and neck done at  [facility] showed areas of hypodensity in the left cerebral hemisphere, new from the prior exam , concerning for acute to subacute infarcts.  Also showed subsegmental occlusion of distal MCA branches.  Patient was transferred to [facility] for further workup with MRI and neurology evaluation.  MRI confirmed acute/subacute infarcts on the left cerebral hemisphere consistent with left MCA territory stroke.  Neurology consulted and following.  Started on stroke workup  Assessment & Plan:  Principal Problem:   Acute ischemic stroke [facility]) Active Problems:   Essential hypertension   Hypokalemia   GERD (gastroesophageal reflux disease)   History of CVA (cerebrovascular accident)   Acute ischemic stroke: Presented  with  speech changes(mainly word finding difficulty), some slurred speech, confusion for last 2 weeks.  CT head and neck showed areas of hypodensity in the left cerebral hemisphere, new from the prior exam , concerning for acute to subacute infarcts.  Also showed subsegmental occlusion of distal MCA branches.  Patient was transferred to [facility] for further workup with MRI and neurology evaluation.  Stroke workup initiated.  PT/OT/speech consulted, echo. A1c of 5.9, LDL of 124 Patient has history of prior stroke but without significant residual deficits.  Not taking any antiplatelet agents at home.  Ambulates with the help of walker. Started on aspirin and Plavix, continue Lipitor at high dose.   History of hypertension: Takes amlodipine, losartan, hydralazine at home.  This medicines are currently on hold.  Allow permissive hypertension.   Gradually normalize blood pressure and 3 to 5 days.  Continue as needed medication for severe hypertension   Hyperlipidemia: Takes Lipitor at home.  Currently been continued   Hypokalemia: Supplemented with potassium and corrected   Mild AKI: Resolved with IV fluid   GERD: Continue Protonix   Marijuana use: UDS positive for THC        DVT prophylaxis:enoxaparin (LOVENOX) injection 40 mg Start: 04/13/23 1000     Code Status: Prior  Family Communication: Called and discussed with mother Julieanne Cotton on phone on 1/24  Patient status:Inpatient  Patient is from :home  Anticipated discharge WJ:XBJY vs SnF  Estimated DC date:tomorrow   Consultants: Neurology  Procedures:None  Antimicrobials:  Anti-infectives (From admission, onward)    None       Subjective: Patient seen and examined at bedside today.  Hemodynamically stable.  Blood pressure better than yesterday.  She still has word finding difficulty.  Currently oriented.  Obeys commands.  Has some weakness on the right lower extremity  Objective: Vitals:   04/12/23 2200 04/12/23 2323 04/13/23 0417 04/13/23 0750  BP:  (!) 150/62 (!) 171/73 (!) 144/49  Pulse:  73 74 (!) 59  Resp:   16 18  Temp:  98.4 F (36.9 C) 98.2 F (36.8 C) 98 F (36.7 C)  TempSrc:  Axillary Axillary   SpO2:  100% 100% 100%  Weight: 73 kg     Height: 5\' 2"  (1.575 m)       Intake/Output Summary (Last 24 hours) at 04/13/2023 1144 Last data filed at 04/12/2023 1811 Gross per 24 hour  Intake 500 ml  Output --  Net 500 ml   Ceasar Mons  Weights   04/12/23 2200  Weight: 73 kg    Examination:  General exam: Overall comfortable, not in distress HEENT: PERRL Respiratory system:  no wheezes or crackles  Cardiovascular system: S1 & S2 heard, RRR.  Gastrointestinal system: Abdomen is nondistended, soft and nontender. Central nervous system: Alert and oriented, dysarthria/word finding difficulty ,weakness of the right lower  extremity Extremities: No edema, no clubbing ,no cyanosis Skin: No rashes, no ulcers,no icterus     Data Reviewed: I have personally reviewed following labs and imaging studies  CBC: Recent Labs  Lab 04/12/23 1255 04/13/23 0642  WBC 6.7 5.7  NEUTROABS 3.7  --   HGB 15.0 13.3  HCT 45.2 39.3  MCV 87.9 87.3  PLT 176 158   Basic Metabolic Panel: Recent Labs  Lab 04/12/23 1255 04/13/23 0642  NA 135 140  K 3.3* 3.8  CL 101 109  CO2 20* 23  GLUCOSE 161* 98  BUN 12 9  CREATININE 1.11* 1.00  CALCIUM 9.3 8.9     No results found for this or any previous visit (from the past 240 hours).   Radiology Studies: MR Brain Wo Contrast (neuro protocol) Result Date: 04/12/2023 CLINICAL DATA:  Initial evaluation for acute neuro deficit, stroke suspected. EXAM: MRI HEAD WITHOUT CONTRAST TECHNIQUE: Multiplanar, multiecho pulse sequences of the brain and surrounding structures were obtained without intravenous contrast. COMPARISON:  Prior CT from earlier the same day. FINDINGS: Brain: Cerebral volume within normal limits. No significant cerebral white matter disease for age. Chronic left cerebellar infarct noted. Scattered foci of restricted diffusion are seen involving the left cerebral hemisphere, corresponding with hypodensity seen on prior CT. Findings consistent with acute to early subacute ischemic infarcts, left MCA distribution. No associated hemorrhage or significant mass effect. There are a few underlying late subacute to chronic infarcts superimposed on this region as well (series 5, image 81, 79 for example). No other evidence for acute or subacute ischemia. No acute intracranial hemorrhage. No mass lesion or midline shift. No hydrocephalus or extra-axial fluid collection. Pituitary gland within normal limits. Vascular: Poor visualization of the left MCA branches, corresponding with attenuated flow noted on prior CT. Major intracranial vascular flow voids are otherwise maintained. Skull  and upper cervical spine: Craniocervical junction within normal limits. Bone marrow signal intensity normal. No scalp soft tissue abnormality. Sinuses/Orbits: Globes orbital soft tissues demonstrate no acute finding. Paranasal sinuses are clear. No mastoid effusion. Other: Few subcentimeter T2 hyperintense nodular densities noted about the parotid glands, nonspecific, but possibly small intraparotid lymph nodes. IMPRESSION: 1. Scattered acute to early subacute ischemic infarcts involving the left cerebral hemisphere, left MCA distribution. No associated hemorrhage or significant mass effect. 2. Few scattered superimposed late subacute to chronic left MCA distribution infarcts. 3. Chronic left cerebellar infarct. Electronically Signed   By: Rise Mu M.D.   On: 04/12/2023 19:28   CT ANGIO HEAD NECK W WO CM Result Date: 04/12/2023 CLINICAL DATA:  Loss of cognitive skills, slurred speech EXAM: CT ANGIOGRAPHY HEAD AND NECK WITH AND WITHOUT CONTRAST TECHNIQUE: Multidetector CT imaging of the head and neck was performed using the standard protocol during bolus administration of intravenous contrast. Multiplanar CT image reconstructions and MIPs were obtained to evaluate the vascular anatomy. Carotid stenosis measurements (when applicable) are obtained utilizing NASCET criteria, using the distal internal carotid diameter as the denominator. RADIATION DOSE REDUCTION: This exam was performed according to the departmental dose-optimization program which includes automated exposure control, adjustment of the mA and/or kV according to patient  size and/or use of iterative reconstruction technique. CONTRAST:  75mL OMNIPAQUE IOHEXOL 350 MG/ML SOLN COMPARISON:  No prior CTA available, correlation is made with 03/05/2018 CT head and 12/06/2016 MRA head FINDINGS: CT HEAD FINDINGS Brain: Areas of hypodensity left cerebral hemisphere (series 2, images 17, 19, 21, and 22), which are new from the prior exam and concerning  for acute to subacute infarcts. No evidence of acute hemorrhage, mass, mass effect, or midline shift. No hydrocephalus or extra-axial fluid collection. Vascular: No hyperdense vessel. Skull: Negative for fracture or focal lesion. Sinuses/Orbits: No acute finding. Other: The mastoid air cells are well aerated. CTA NECK FINDINGS Aortic arch: Two-vessel arch with a common origin of the brachiocephalic and left common carotid arteries. Imaged portion shows no evidence of aneurysm or dissection. No significant stenosis of the major arch vessel origins. Right carotid system: No evidence of dissection, occlusion, or hemodynamically significant stenosis (greater than 50%). Left carotid system: No evidence of dissection, occlusion, or hemodynamically significant stenosis (greater than 50%). Vertebral arteries: No evidence of dissection, occlusion, or hemodynamically significant stenosis (greater than 50%). Skeleton: No acute osseous abnormality. Degenerative changes in the cervical spine. Other neck: 3 mm hypoenhancing focus in the right thyroid lobe, for which no follow-up is currently indicated. Upper chest: No focal pulmonary opacity or pleural effusion. Review of the MIP images confirms the above findings CTA HEAD FINDINGS Evaluation is somewhat limited by venous contamination. Anterior circulation: Both internal carotid arteries are patent to the termini, without significant stenosis. A1 segments patent. Normal anterior communicating artery. Anterior cerebral arteries are patent to their distal aspects without significant stenosis. Short segment occlusion or near occlusive stenosis in left M1 (series 605, images 126-128), with possible reconstitution in the proximal more posterior left M2 (series 605, image 126). The anterior M2 is not well seen. Additional severe stenosis in the insular left M2 (series 605, image 121 and 124), with overall poor opacification distal left MCA branches. Mild stenosis in the proximal right  M 1 (series 605, image 129). Right MCA branches are irregular but grossly perfused without high-grade stenosis. Posterior circulation: Vertebral arteries patent to the vertebrobasilar junction without significant stenosis. Basilar patent to its distal aspect without significant stenosis. Superior cerebellar arteries patent proximally. Patent P1 segments. PCAs perfused to their distal aspects without significant stenosis. The bilateral posterior communicating arteries are not visualized. Venous sinuses: Well opacified, patent. Anatomic variants: None significant. No evidence of aneurysm or vascular malformation. Review of the MIP images confirms the above findings IMPRESSION: 1. Areas of hypodensity in the left cerebral hemisphere, which are new from the prior exam and concerning for acute to subacute infarcts. An MRI is recommended for further evaluation. 2. 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 The anterior M2 is not well seen. Additional severe stenosis in the insular left M2, with overall poor opacification of distal left MCA branches. 3. No hemodynamically significant stenosis in the neck. These results were called by telephone at the time of interpretation on 04/12/2023 at 2:53 pm to provider Edwin Dada , who verbally acknowledged these results. Electronically Signed   By: Wiliam Ke M.D.   On: 04/12/2023 14:54    Scheduled Meds:  aspirin  81 mg Oral Daily   atorvastatin  80 mg Oral Daily   cholecalciferol  800 Units Oral Daily   clopidogrel  75 mg Oral Daily   enoxaparin (LOVENOX) injection  40 mg Subcutaneous Q24H   gabapentin  300 mg Oral TID   pantoprazole  40 mg Oral Daily   Continuous Infusions:  sodium chloride 75 mL/hr at 04/12/23 2008     LOS: 0 days   Burnadette Pop, MD [facility] P1/24/2025, 11:44 AM

## 2023-04-13 NOTE — Evaluation (Signed)
Occupational Therapy Evaluation Patient Details Name: Brittany [name] MRN: [id number] DOB: [birth year] Today's Date: 04/13/2023   History of Present Illness 58 yo female presenting with "loss of cognitive skills" over the past 1-2 weeks and slurring of speech. MRI on 04/12/2023 revealed scattered acute to early subacute ischemic infarcts involving the  left cerebral hemisphere, left MCA distribution.   PHMx: Anemia, arthritis, elevated cholesterol, GERD, heart murmur, female bladder prolapse, IBS, hiatal hernia, stroke, multiple myeloma, SOB, and peripheral neuropathy.   Clinical Impression   Brittany [name] was evaluated s/p the above admission list. She lives alone with a PCA a few days a week, ambulates with AD and completes ADLs with mod I at baseline. Upon evaluation the pt was limited by impaired communication and cognition, impulsivity, unsteady gait and limited activity tolerance. Overall she needed close CGA with cues for mobility with RW due to impulsivity and unsteady balance. Due to the deficits listed below the pt also needs set up A for UB ADLs and CGA with cues for LB ADLs for safety. Pt will benefit from continued acute OT services and intensive inpatient follow up therapy, >3 hours/day after discharge to progress to mod I baseline.         If plan is discharge home, recommend the following: A little help with walking and/or transfers;A little help with bathing/dressing/bathroom;Assistance with cooking/housework;Direct supervision/assist for medications management;Direct supervision/assist for financial management;Assist for transportation;Help with stairs or ramp for entrance    Functional Status Assessment  Patient has had a recent decline in their functional status and demonstrates the ability to make significant improvements in function in a reasonable and predictable amount of time.  Equipment Recommendations  None recommended by OT    Recommendations for Other Services Rehab  consult     Precautions / Restrictions Precautions Precautions: Fall Restrictions Weight Bearing Restrictions Per Provider Order: No      Mobility Bed Mobility Overal bed mobility: Modified Independent                  Transfers Overall transfer level: Needs assistance Equipment used: Rolling walker (2 wheels) Transfers: Sit to/from Stand Sit to Stand: Contact guard assist           General transfer comment: pt impulsive      Balance Overall balance assessment: Mild deficits observed, not formally tested                                         ADL either performed or assessed with clinical judgement   ADL Overall ADL's : Needs assistance/impaired Eating/Feeding: Independent Eating/Feeding Details (indicate cue type and reason): eating in long sitting upon arrival, no difficulties with FM or dexterity Grooming: Supervision/safety;Standing   Upper Body Bathing: Set up;Sitting   Lower Body Bathing: Contact guard assist;Sit to/from stand   Upper Body Dressing : Set up;Sitting   Lower Body Dressing: Contact guard assist;Sit to/from stand   Toilet Transfer: Contact guard assist;Ambulation;Rolling walker (2 wheels)   Toileting- Clothing Manipulation and Hygiene: Supervision/safety;Sitting/lateral lean       Functional mobility during ADLs: Contact guard assist;Rolling walker (2 wheels) General ADL Comments: pt limited by expressive communication and impulsivity. CGA required for safety with cues     Vision Baseline Vision/History: 0 No visual deficits Vision Assessment?: No apparent visual deficits Additional Comments: not formally assessed, seemed WFL.     Perception Perception: Not tested  Praxis Praxis: Not tested       Pertinent Vitals/Pain Pain Assessment Pain Assessment: No/denies pain Pain Intervention(s): Monitored during session     Extremity/Trunk Assessment Upper Extremity Assessment Upper Extremity  Assessment: Difficult to assess due to impaired cognition;RUE deficits/detail;LUE deficits/detail;Right hand dominant RUE Deficits / Details: RHD. Pt able to write her name and DOB with pen/paper, feed herself and manage bed linens appropriately with R hand. Seemingly WFL for MMT and ROM. LUE Deficits / Details: Seemingly WFL with observed functional tasks. difficult to fully assess due to impaired cognition   Lower Extremity Assessment Lower Extremity Assessment: Defer to PT evaluation RLE Deficits / Details: ROM WFL, strength grossly 3+/5, RLE Coordination: decreased fine motor LLE Deficits / Details: ROM WFL, strength grossly 3+/5, LLE Coordination: decreased fine motor   Cervical / Trunk Assessment Cervical / Trunk Assessment: Normal   Communication Communication Communication: Difficulty communicating thoughts/reduced clarity of speech Cueing Techniques: Verbal cues;Gestural cues;Tactile cues;Visual cues   Cognition Arousal: Alert Behavior During Therapy: Impulsive Overall Cognitive Status: Difficult to assess                                 General Comments: difficult to assess due to expressive communication impairment. Pt able to state her name and DOB with cues, abel to state "hospital" without cues and read "[place]" on therapists badge. Pt reporting conflicting home set up/support information (confirmed with mom via phone). She followed most one-2 step commands. She is impulsive and needs cues for safety/redirection.     General Comments  VSS            Home Living Family/patient expects to be discharged to:: Private residence Living Arrangements: Alone Available Help at Discharge: Family;Personal care attendant;Available PRN/intermittently Type of Home: House Home Access: Level entry     Home Layout: One level     Bathroom Shower/Tub: Chief Strategy Officer: Handicapped height Bathroom Accessibility: Yes   Home Equipment:  Rollator (4 wheels);BSC/3in1   Additional Comments: pt's son is in [place], elderly parents are in [place]. Pt has a PCA.  Lives With: Alone    Prior Functioning/Environment Prior Level of Function : Independent/Modified Independent;Driving             Mobility Comments: drives, needs to use Rollator all the time due to pain in LE and decreased endurance (per mother) - however, pt states she uses a SPC for mobility. ADLs Comments: in home caregiver, 3 day/wk cooking, cleaning per mothers report        OT Problem List: Decreased strength;Decreased activity tolerance;Impaired balance (sitting and/or standing);Decreased cognition;Decreased safety awareness;Decreased knowledge of use of DME or AE;Decreased knowledge of precautions      OT Treatment/Interventions: Self-care/ADL training;Therapeutic exercise;Neuromuscular education;DME and/or AE instruction;Therapeutic activities;Patient/family education;Balance training    OT Goals(Current goals can be found in the care plan section) Acute Rehab OT Goals Patient Stated Goal: home OT Goal Formulation: With patient Time For Goal Achievement: 04/27/23 Potential to Achieve Goals: Good ADL Goals Pt Will Perform Grooming: with modified independence Pt Will Perform Upper Body Dressing: with modified independence Pt Will Transfer to Toilet: with modified independence;ambulating Additional ADL Goal #1: Pt will indep complete IADL medicaiton management task Additional ADL Goal #2: Pt will indep complete 2-3 step way finding task to demonstrate improved working memory  OT Frequency: Min 1X/week    Co-evaluation  AM-PAC OT "6 Clicks" Daily Activity     Outcome Measure Help from another person eating meals?: None Help from another person taking care of personal grooming?: A Little Help from another person toileting, which includes using toliet, bedpan, or urinal?: A Little Help from another person bathing (including washing,  rinsing, drying)?: A Little Help from another person to put on and taking off regular upper body clothing?: A Little Help from another person to put on and taking off regular lower body clothing?: A Little 6 Click Score: 19   End of Session Equipment Utilized During Treatment: Rolling walker (2 wheels) Nurse Communication: Mobility status  Activity Tolerance: Patient tolerated treatment well Patient left: in bed;with call bell/phone within reach;with bed alarm set  OT Visit Diagnosis: Unsteadiness on feet (R26.81);Other abnormalities of gait and mobility (R26.89);Muscle weakness (generalized) (M62.81);Cognitive communication deficit (R41.841) Symptoms and signs involving cognitive functions: Cerebral infarction                Time: 1101-1120 OT Time Calculation (min): 19 min Charges:  OT General Charges $OT Visit: 1 Visit OT Evaluation $OT Eval Moderate Complexity: 1 Mod  Derenda Mis, OTR/L Acute Rehabilitation Services Office [phone] Secure Chat Communication Preferred   Donia Pounds 04/13/2023, 12:11 PM

## 2023-04-13 NOTE — Progress Notes (Signed)
  Echocardiogram 2D Echocardiogram has been performed.  Ocie Doyne RDCS 04/13/2023, 11:31 AM

## 2023-04-13 NOTE — Consult Note (Addendum)
NEUROLOGY CONSULT NOTE   Date of service: April 13, 2023 Patient Name: Brittany [name] MRN:  [id number] DOB:  1965-05-02 Chief Complaint: Slurred speech, word finding difficulty and cognitive decline Requesting Provider: Burnadette Pop, MD  History of Present Illness  Brittany [name] is a 58 y.o. female with a PMHx of Anemia, Arthritis, Complication of anesthesia, Elevated cholesterol, Female bladder prolapse, GERD, Heart murmur, Hiatal hernia, Hypertension, IBS (irritable bowel syndrome), Multiple myeloma (2005), Peripheral neuropathy, SOB on exertion, and Stroke (2017) who presented to [facility] on Thursday after experiencing for the past 1-2 weeks slurred speech, word finding difficulty and cognitive decline. She was noted to have subacute appearing strokes on her CT head in the left MCA territory. CTA revealed short segment occlusion or near occlusive stenosis in left M1, with possible reconstitution in the proximal left M2. The patient also has a history of prior cerebellar stroke without much residual deficits.    ROS  Unable to ascertain due to aphasia.   Past History   Past Medical History:  Diagnosis Date   Anemia    Arthritis    trigger finger in left hand   Complication of anesthesia    per pt, hard to wake up!   Elevated cholesterol    Female bladder prolapse    per urologist, does not have prolaspe   GERD (gastroesophageal reflux disease)    Heart murmur    pt unsure.   Hiatal hernia    Hypertension    IBS (irritable bowel syndrome)    Multiple myeloma (HCC) 2005   had partial chemo   Peripheral neuropathy    SOB (shortness of breath) on exertion    uses an inhaler   Stroke [facility]) 2017   paralysis left arm/uses a walker    Past Surgical History:  Procedure Laterality Date   BONE BIOPSY  2005   in her back   COLONOSCOPY     over 10 years x3   ESOPHAGOGASTRODUODENOSCOPY     incomplete-over 10 years ago    TEE WITHOUT CARDIOVERSION N/A 12/08/2016    Procedure: TRANSESOPHAGEAL ECHOCARDIOGRAM (TEE);  Surgeon: Quintella Reichert, MD;  Location: [facility] ENDOSCOPY;  Service: Cardiovascular;  Laterality: N/A;   TUBAL LIGATION     UPPER GASTROINTESTINAL ENDOSCOPY     WISDOM TOOTH EXTRACTION      Family History: Family History  Problem Relation Age of Onset   Hypertension Mother    Sarcoidosis Mother        currently in remission    Diverticulitis Mother    Irritable bowel syndrome Mother    Liver cancer Mother    Hypertension Father    Stomach cancer Father    Congestive Heart Failure Father    Stroke Maternal Uncle    Scoliosis Brother    Colon cancer Neg Hx    Esophageal cancer Neg Hx    Colon polyps Neg Hx    Rectal cancer Neg Hx     Social History  reports that she quit smoking about 7 years ago. Her smoking use included cigarettes. She has never used smokeless tobacco. She reports that she does not currently use alcohol. She reports that she does not currently use drugs after having used the following drugs: Marijuana.  Allergies  Allergen Reactions   Amitriptyline Other (See Comments)    Patient reported that it made her throat feel like its locking up and it also caused issues with her going to the bathroom   Duloxetine Other (See Comments)  Patient reported that it made her throat lock up and it caused her to have issue with going to the bathroom   Naproxen     Vomiting, sweating, abd spasms   Other     States can't take pain meds that end in "cet" or meds that end in "ine" Darvocet/severe vomiting   Beef-Derived Drug Products     Patient has IBS prefers no beef   Pork-Derived Products     Patient has IBS prefers no pork   Darvon [Propoxyphene] Nausea And Vomiting   Hydrocodone Nausea And Vomiting   Lactose Intolerance (Gi)     Bloating, gas, abd pain   Latex Itching and Rash   Oxycodone Nausea And Vomiting   Percocet [Oxycodone-Acetaminophen] Nausea And Vomiting   Topamax [Topiramate]     Memory made her  emotional     Medications   Current Facility-Administered Medications:     stroke: early stages of recovery book, , Does not apply, Once, Adhikari, Amrit, MD   0.9 %  sodium chloride infusion, , Intravenous, Continuous, Adhikari, Amrit, MD, Last Rate: 75 mL/hr at 04/12/23 2008, New Bag at 04/12/23 2008   aspirin chewable tablet 81 mg, 81 mg, Oral, Daily, Adhikari, Amrit, MD, 81 mg at 04/12/23 1810   atorvastatin (LIPITOR) tablet 80 mg, 80 mg, Oral, Daily, Adhikari, Amrit, MD   cholecalciferol (VITAMIN D3) 10 MCG (400 UNIT) tablet 800 Units, 800 Units, Oral, Daily, Adhikari, Amrit, MD   enoxaparin (LOVENOX) injection 40 mg, 40 mg, Subcutaneous, Q24H, Adhikari, Amrit, MD   gabapentin (NEURONTIN) capsule 300 mg, 300 mg, Oral, TID, Adhikari, Amrit, MD, 300 mg at 04/12/23 2118   labetalol (NORMODYNE) injection 10 mg, 10 mg, Intravenous, Q2H PRN, Renford Dills, Amrit, MD, 10 mg at 04/12/23 2159   pantoprazole (PROTONIX) EC tablet 40 mg, 40 mg, Oral, Daily, Adhikari, Amrit, MD  No current facility-administered medications on file prior to encounter.   Current Outpatient Medications on File Prior to Encounter  Medication Sig Dispense Refill   albuterol (VENTOLIN HFA) 108 (90 Base) MCG/ACT inhaler Inhale 2 puffs into the lungs every 4 (four) hours as needed for wheezing or shortness of breath. 8.5 g 11   atorvastatin (LIPITOR) 80 MG tablet Take 1 tablet (80 mg total) by mouth daily. 90 tablet 3   gabapentin (NEURONTIN) 300 MG capsule Take 1 capsule (300 mg total) by mouth 3 (three) times daily. 90 capsule 3   hydrALAZINE (APRESOLINE) 50 MG tablet Take 1 tablet (50 mg total) by mouth every 8 (eight) hours. 90 tablet 0   hydrochlorothiazide (HYDRODIURIL) 12.5 MG tablet Take 12.5 mg by mouth daily.     linaclotide (LINZESS) 72 MCG capsule Take 1 capsule (72 mcg total) by mouth daily before breakfast. 90 capsule 1   losartan (COZAAR) 50 MG tablet Take 1 tablet (50 mg total) by mouth daily. 30 tablet 5    omeprazole (PRILOSEC) 20 MG capsule Take 1 capsule (20 mg total) by mouth 2 (two) times daily before a meal. 180 capsule 3   amLODipine (NORVASC) 10 MG tablet Take 1 tablet (10 mg total) by mouth daily. (Patient not taking: Reported on 04/12/2023) 30 tablet 0   chlorthalidone (HYGROTON) 25 MG tablet Take 1 tablet (25 mg total) by mouth daily. (Patient not taking: Reported on 04/12/2023) 30 tablet 0   triamcinolone cream (KENALOG) 0.1 % APPLY 1 APPLICATION TOPICALLY 2 (TWO) TIMES DAILY. (Patient not taking: Reported on 04/12/2023) 454 g 2     Vitals   Vitals:  May 11, 2023 2153 05-11-2023 2200 05-11-23 2323 04/13/23 0417  BP: (!) 211/95  (!) 150/62 (!) 171/73  Pulse: 83  73 74  Resp: 16   16  Temp: 98.6 F (37 C)  98.4 F (36.9 C) 98.2 F (36.8 C)  TempSrc: Axillary  Axillary Axillary  SpO2: 99%  100% 100%  Weight:  73 kg    Height:  5\' 2"  (1.575 m)      Body mass index is 29.44 kg/m.  Physical Exam   Physical Exam  HEENT-  [place]/AT    Lungs- Respirations unlabored Extremities- No edema   Neurological Examination Mental Status: Awake and alert. Receptive and expressive aphasia. Cannot answer any orientation questions. Perseverates the same one-word answer to questions.  Intonation has a slow and childlike quality. Can count fingers and state "5", otherwise with no meaningful verbal output. Has significant difficulty following one-step commands. Cannot demonstrate comprehension of complex commands.  Cranial Nerves: II: Unable to assess visual fields due to patient inability to follow complex commands. Makes eye contact. Can count fingers.   III,IV, VI: No ptosis. EOMI.   VII: Smile symmetric VIII: Hearing intact to voice IX,X: No hoarseness XII: Midline tongue extension Motor: RUE 4/5 LUE 4-/5 BLE able to elevate with 5/5 strength.  Positive rotating fingers test on the right.   Sensory: Intact to FT x 4  Deep Tendon Reflexes: 2+ and symmetric bilateral brachioradialis and  patellar reflexes. Cerebellar: No ataxia with finger to nose. Has difficulty comprehending command, requiring pantomiming by examiner Gait: Deferred  Labs/Imaging/Neurodiagnostic studies   CBC:  Recent Labs  Lab 05/11/23 1255 04/13/23 0642  WBC 6.7 5.7  NEUTROABS 3.7  --   HGB 15.0 13.3  HCT 45.2 39.3  MCV 87.9 87.3  PLT 176 158   Basic Metabolic Panel:  Lab Results  Component Value Date   NA 135 05/11/23   K 3.3 (L) 05/11/23   CO2 20 (L) 2023-05-11   GLUCOSE 161 (H) 05/11/2023   BUN 12 2023/05/11   CREATININE 1.11 (H) 2023/05/11   CALCIUM 9.3 2023/05/11   GFRNONAA 58 (L) May 11, 2023   GFRAA 86 02/04/2019   Lipid Panel:  Lab Results  Component Value Date   LDLCALC 134 (H) 12/06/2022   HgbA1c:  Lab Results  Component Value Date   HGBA1C 5.9 (H) 04/13/2023   Urine Drug Screen:     Component Value Date/Time   LABOPIA NONE DETECTED May 11, 2023 1255   COCAINSCRNUR NONE DETECTED 05/11/23 1255   LABBENZ NONE DETECTED 05-11-23 1255   AMPHETMU NONE DETECTED 05-11-2023 1255   THCU POSITIVE (A) 05-11-23 1255   LABBARB NONE DETECTED 2023-05-11 1255    Alcohol Level     Component Value Date/Time   ETH <5 12/05/2016 1014   INR  Lab Results  Component Value Date   INR 1.0 2023-05-11   APTT  Lab Results  Component Value Date   APTT 41 (H) 12/05/2016   TTE 12/07/22: 1. Maximall septal thickness 22 mm. Peak intracavitary gradient 12 mm Hg.  Left ventricular ejection fraction, by estimation, is 70 to 75%. The left  ventricle has hyperdynamic function. The left ventricle has no regional  wall motion abnormalities. There  is severe concentric left ventricular hypertrophy. Left ventricular  diastolic parameters are consistent with Grade I diastolic dysfunction  (impaired relaxation).   2. Right ventricular systolic function is normal. The right ventricular  size is normal. There is normal pulmonary artery systolic pressure.   3. The mitral valve is grossly  normal. No evidence of mitral valve  regurgitation.   4. The aortic valve was not well visualized. Aortic valve regurgitation  is not visualized.   ASSESSMENT  58 y.o. female with a PMHx of Anemia, Arthritis, Complication of anesthesia, Elevated cholesterol, Female bladder prolapse, GERD, Heart murmur, Hiatal hernia, Hypertension, IBS (irritable bowel syndrome), Multiple myeloma (2005), Peripheral neuropathy, SOB on exertion, and Stroke (2017) who presented to [facility] on Thursday after experiencing for the past 1-2 weeks slurred speech, word finding difficulty and cognitive decline. She was noted to have subacute appearing strokes on her CT head in the left MCA territory. CTA revealed short segment occlusion or near occlusive stenosis in left M1, with possible reconstitution in the proximal left M2. The patient also has a history of prior cerebellar stroke without much residual deficits.  - Exam reveals expressive and receptive aphasia in conjunction with mild left > right upper extremity weakness.  - CT head: Areas of hypodensity in the left cerebral hemisphere, which are new from the prior exam and concerning for acute to subacute infarcts.  - CTA of head and neck: Short segment occlusion or near occlusive stenosis in left M1, with possible reconstitution in the proximal left M2. The anterior M2 is not well seen. Additional severe stenosis in the insular left M2, with overall poor opacification of distal left MCA branches. No hemodynamically significant stenosis in the neck. - MRI brain: Scattered acute to early subacute ischemic infarcts involving the left cerebral hemisphere, left MCA distribution. No associated hemorrhage or significant mass effect. Few scattered superimposed late subacute to chronic left MCA distribution infarcts. Chronic left cerebellar infarct. - Labs: HgbA1c elevated at 5.9. THC positive. Mild hypokalemia - Not a TNK or thrombectomy candidate due to time criteria.  - Impression:  Subacute left MCA ischemic infarctions. DDx for underlying etiology includes cardioembolic, atherothrombotic and hypercoagulability given history of multiple myeloma.   RECOMMENDATIONS  - Agree with starting ASA  - Continue atorvastatin - Fasting lipid panel - Cardiac telemetry - PT consult, OT consult, Speech consult - Risk factor modification - Telemetry monitoring - BP management per standard protocol. Out of the permissive HTN time window - Frequent neuro checks - NPO until passes stroke swallow screen - Stroke Team to follow   ______________________________________________________________________    Dessa Phi, Fany Cavanaugh, MD [facility]

## 2023-04-13 NOTE — Progress Notes (Signed)
Inpatient Rehab Admissions Coordinator:   Per therapy recommendations pt was screened for CIR by Estill Dooms, PT, DPT.  Note evaluations limited by hypertension.  Mobility and ADLs that were able to be assessed required no physical assist, but significant impairments in cognition/communication.  Will rescreen after next therapy session, but pt likely does not have need for intensive therapy in multiple disciplines.    Estill Dooms, PT, DPT Admissions Coordinator [phone] 04/13/23  3:20 PM

## 2023-04-13 NOTE — Plan of Care (Signed)
  Problem: Education: Goal: Knowledge of disease or condition will improve 04/13/2023 0511 by Ronna Polio, RN Outcome: Progressing 04/12/2023 2233 by Raynelle Dick D, RN Outcome: Progressing Goal: Knowledge of secondary prevention will improve (MUST DOCUMENT ALL) 04/13/2023 0511 by Ronna Polio, RN Outcome: Progressing 04/12/2023 2233 by Raynelle Dick D, RN Outcome: Progressing Goal: Knowledge of patient specific risk factors will improve (DELETE if not current risk factor) 04/13/2023 0511 by Ronna Polio, RN Outcome: Progressing 04/12/2023 2233 by Raynelle Dick D, RN Outcome: Progressing   Problem: Ischemic Stroke/TIA Tissue Perfusion: Goal: Complications of ischemic stroke/TIA will be minimized 04/13/2023 0511 by Ronna Polio, RN Outcome: Progressing 04/12/2023 2233 by Raynelle Dick D, RN Outcome: Progressing   Problem: Coping: Goal: Will verbalize positive feelings about self 04/13/2023 0511 by Ronna Polio, RN Outcome: Progressing 04/12/2023 2233 by Raynelle Dick D, RN Outcome: Progressing Goal: Will identify appropriate support needs 04/13/2023 0511 by Ronna Polio, RN Outcome: Progressing 04/12/2023 2233 by Ronna Polio, RN Outcome: Progressing

## 2023-04-13 NOTE — Evaluation (Signed)
Physical Therapy Evaluation Patient Details Name: Brittany [name] MRN: [id number] DOB: 08-04-1965 Today's Date: 04/13/2023  History of Present Illness  58 yo female presenting with "loss of cognitive skills" over the past 1-2 weeks and slurring of speech. MRI on 04/12/2023 revealed scattered acute to early subacute ischemic infarcts involving the  left cerebral hemisphere, left MCA distribution.   PHMx: Anemia, arthritis, elevated cholesterol, GERD, heart murmur, female bladder prolapse, IBS, hiatal hernia, stroke, multiple myeloma, SOB, and peripheral neuropathy.  Clinical Impression  Pt obviously upset by inability to communicate effectively. Attempted to have pt confirm PLOF and home set up to compare to documentation from prior hospitalization and pt makes some seemingly appropriate adjustments. However, after Evaluation pt called mother for clarification and reports PLOF more impaired than pt reports. Mother reports that pt lives alone with Aide who assists with cooking and cleaning 3x/wk. Pt was needing a Rollator for all mobility due to increased pain in back and LE, and fatiguing more quickly in the last month. Pt is currently limited by impaired communication, decreased cognition and increased symptomatic BP with movement. Pt mod I for bed mobility and contact guard for coming to standing. Pt with far away look in her eyes and confirms feeling dizzy when asked. Pt returned to seated EoB and BP when measured 181/87, outside mobility parameters. Pt returned to supine for heart Echo. Patient will benefit from intensive inpatient follow up therapy, >3 hours/day. PT will continue to follow acutely.        If plan is discharge home, recommend the following: A little help with walking and/or transfers;A little help with bathing/dressing/bathroom;Assistance with cooking/housework;Direct supervision/assist for medications management;Direct supervision/assist for financial management;Assist for  transportation;Help with stairs or ramp for entrance;Supervision due to cognitive status   Can travel by private vehicle    Yes    Equipment Recommendations None recommended by PT  Recommendations for Other Services  Rehab consult    Functional Status Assessment Patient has had a recent decline in their functional status and demonstrates the ability to make significant improvements in function in a reasonable and predictable amount of time.     Precautions / Restrictions Precautions Precautions: Fall Restrictions Weight Bearing Restrictions Per Provider Order: No      Mobility  Bed Mobility Overal bed mobility: Modified Independent             General bed mobility comments: HoB elevated and slight use of bedrail, able to get herself back into bed without assist    Transfers Overall transfer level: Needs assistance Equipment used: Rolling walker (2 wheels) Transfers: Sit to/from Stand Sit to Stand: Contact guard assist           General transfer comment: vc for hand placement good power up increased time to steady, pt noted to have far away look and is very quiet, answers yes to far away look, answers yes to dizziness, had pt return to seated EoB    Ambulation/Gait               General Gait Details: deferred due to elevated BP        Balance Overall balance assessment: Mild deficits observed, not formally tested                                           Pertinent Vitals/Pain Pain Assessment Pain Assessment: Faces Faces Pain Scale: Hurts  a little bit Pain Location: generalized with movement Pain Descriptors / Indicators: Grimacing, Guarding Pain Intervention(s): Limited activity within patient's tolerance, Monitored during session, Repositioned    Home Living Family/patient expects to be discharged to:: Private residence Living Arrangements: Alone Available Help at Discharge: Family;Personal care attendant;Available  PRN/intermittently (has aide 3x/week for cooking and cleaning assistance) Type of Home: House Home Access: Level entry       Home Layout: One level Home Equipment: Rollator (4 wheels);BSC/3in1      Prior Function Prior Level of Function : Independent/Modified Independent;Driving             Mobility Comments: drives, needs to use Rollator all the time due to pain in LE and decreased endurance (per mother) ADLs Comments: in home caregiver, 3 day/wk cooking, cleaning,     Extremity/Trunk Assessment   Upper Extremity Assessment Upper Extremity Assessment: Defer to OT evaluation    Lower Extremity Assessment Lower Extremity Assessment: Difficult to assess due to impaired cognition;RLE deficits/detail;LLE deficits/detail RLE Deficits / Details: ROM WFL, strength grossly 3+/5, RLE Coordination: decreased fine motor LLE Deficits / Details: ROM WFL, strength grossly 3+/5, LLE Coordination: decreased fine motor    Cervical / Trunk Assessment Cervical / Trunk Assessment: Normal  Communication   Communication Communication: Difficulty communicating thoughts/reduced clarity of speech Cueing Techniques: Verbal cues;Gestural cues;Tactile cues;Visual cues  Cognition Arousal: Alert Behavior During Therapy: WFL for tasks assessed/performed, Impulsive (upset about inability to express herself, slightly impulsive with movement) Overall Cognitive Status: Difficult to assess Area of Impairment: Memory                     Memory:  (decreased memory of PLOF)         General Comments: utilized information from prior hospitalization for PLOF and home set up, pt reports higher level of function, than what mother relays in phone conversation after treatment session        General Comments General comments (skin integrity, edema, etc.): BP after standing 181/87, deferred further mobilization and notified RN        Assessment/Plan    PT Assessment Patient needs continued PT  services  PT Problem List Decreased strength;Decreased activity tolerance;Decreased balance;Decreased cognition;Decreased safety awareness;Cardiopulmonary status limiting activity       PT Treatment Interventions DME instruction;Gait training;Functional mobility training;Therapeutic activities;Therapeutic exercise;Balance training;Neuromuscular re-education;Cognitive remediation;Patient/family education    PT Goals (Current goals can be found in the Care Plan section)  Acute Rehab PT Goals PT Goal Formulation: Patient unable to participate in goal setting Time For Goal Achievement: 04/27/23 Potential to Achieve Goals: Good    Frequency Min 1X/week        AM-PAC PT "6 Clicks" Mobility  Outcome Measure Help needed turning from your back to your side while in a flat bed without using bedrails?: None Help needed moving from lying on your back to sitting on the side of a flat bed without using bedrails?: None Help needed moving to and from a bed to a chair (including a wheelchair)?: A Little Help needed standing up from a chair using your arms (e.g., wheelchair or bedside chair)?: A Little Help needed to walk in hospital room?: Total Help needed climbing 3-5 steps with a railing? : Total 6 Click Score: 16    End of Session Equipment Utilized During Treatment: Gait belt Activity Tolerance: Treatment limited secondary to medical complications (Comment) (elevated BP) Patient left: in bed;with bed alarm set;Other (comment) (ECHO tech present) Nurse Communication: Mobility status;Other (comment) (increased  BP) PT Visit Diagnosis: Muscle weakness (generalized) (M62.81);Difficulty in walking, not elsewhere classified (R26.2);Other symptoms and signs involving the nervous system (R29.898);Dizziness and giddiness (R42);Unsteadiness on feet (R26.81)    Time: 1021-1040 PT Time Calculation (min) (ACUTE ONLY): 19 min   Charges:   PT Evaluation $PT Eval Moderate Complexity: 1 Mod   PT General  Charges $$ ACUTE PT VISIT: 1 Visit         Aariya Ferrick B. Beverely Risen PT, DPT Acute Rehabilitation Services Please use secure chat or  Call Office [phone]   Elon Alas [facility] 04/13/2023, 11:39 AM

## 2023-04-13 NOTE — Evaluation (Signed)
Speech Language Pathology Evaluation Patient Details Name: Brittany [name] MRN: [id number] DOB: February 28, 1966 Today's Date: 04/13/2023 Time: 7829-5621 SLP Time Calculation (min) (ACUTE ONLY): 25 min  Problem List:  Patient Active Problem List   Diagnosis Date Noted   Acute ischemic stroke (HCC) 04/12/2023   GERD (gastroesophageal reflux disease) 12/06/2022   Chronic osteoarthritis 12/06/2022   AKI (acute kidney injury) (HCC) 12/06/2022   Muscle spasm of both lower legs 12/06/2022   History of CVA (cerebrovascular accident) 12/06/2022   Class 1 obesity 12/06/2022   Hypertensive emergency 12/05/2022   Elevated troponin 12/05/2022   Aortic atherosclerosis (HCC) 07/10/2022   IBS (irritable bowel syndrome) 12/01/2021   Ganglion cyst of volar aspect of left wrist 05/20/2020   Trigger thumb of left hand 05/20/2020   Labral tear of shoulder, degenerative, right 05/20/2020   Anxiety 02/05/2019   Constipation 10/08/2018   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   B12 deficiency 12/19/2016   Cerebellar infarct (HCC)    Diastolic dysfunction    Marijuana abuse    Tachycardia    Hypokalemia    Stroke (cerebrum) (HCC) 12/05/2016   Occipital stroke (HCC) 12/05/2016   Hypertensive urgency 12/05/2016   Essential hypertension    Past Medical History:  Past Medical History:  Diagnosis Date   Anemia    Arthritis    trigger finger in left hand   Complication of anesthesia    per pt, hard to wake up!   Elevated cholesterol    Female bladder prolapse    per urologist, does not have prolaspe   GERD (gastroesophageal reflux disease)    Heart murmur    pt unsure.   Hiatal hernia    Hypertension    IBS (irritable bowel syndrome)    Multiple myeloma (HCC) 2005   had partial chemo   Peripheral neuropathy    SOB (shortness of breath) on exertion    uses an inhaler   Stroke [facility]) 2017   paralysis left arm/uses a walker   Past Surgical History:  Past Surgical  History:  Procedure Laterality Date   BONE BIOPSY  2005   in her back   COLONOSCOPY     over 10 years x3   ESOPHAGOGASTRODUODENOSCOPY     incomplete-over 10 years ago    TEE WITHOUT CARDIOVERSION N/A 12/08/2016   Procedure: TRANSESOPHAGEAL ECHOCARDIOGRAM (TEE);  Surgeon: Quintella Reichert, MD;  Location: [facility] ENDOSCOPY;  Service: Cardiovascular;  Laterality: N/A;   TUBAL LIGATION     UPPER GASTROINTESTINAL ENDOSCOPY     WISDOM TOOTH EXTRACTION     HPI:  Brittany [name] is a 58 y.o. female with medical history significant of stroke, hypertension, hyperlipidemia, obesity who presented from home with complaint of difficulty with her speech.  Patient history of prior cerebellar stroke.  But at baseline she is alert and oriented, lives alone , not much residual deficits,ambulates witht he help of cane.  Her mother helps her at home.  Her mother has noticed changes in his speech for last 2 weeks.  His speech appeared slurred.  And she developed inability to identify objects.  No history of weakness on any particular extremities or facial asymmetry or loss of sensation.  Her mother also reported that she became forgetful and confused, cognitively declined.  She was brought to [facility].  CT angiogram of the head and neck done at  [facility] showed areas of hypodensity in the left cerebral hemisphere, new from the prior exam , concerning  for acute to subacute infarcts.  Also showed subsegmental occlusion of distal MCA branches.  Patient was transferred to [facility] for further workup with MRI and neurology evaluation. SLP consulted for speech/language assessment   Assessment / Plan / Recommendation Clinical Impression  Pt seen for speech/language assessment with expressive aphasia evident during communicative interactions.  This was c/b groping, hesitations, increased emotional lability and verbal agitation during speaking attempts.  Pt demonstrated decreased naming ability during responsive,  confrontational, and deductive naming tasks.  She was able to state "I can't say it, I know what I want, but can't do it." Repetition of words/phrases accurate, but breakdown noted during sentence formulation.  Pt able to read words/simple phrases and environmental signs with visual cues to attend to R given, but further reading assessment not completed.  Pt able to state targeted words with phonemic and semantic cues given, but aphasia persisted even during min-mod A/compensatory strategies provided by SLP.  Graphic expression impacted by aphasia as pt became emotionally labile during writing task, unable to retrieve words during sentence formulation.  Education/compensatory strategies given to increase communicative competence such as utilizing gestures/non-verbal cues/communication to make needs/wants known to others.  Auditory comprehension appears WFL, but difficult to assess fully as was cognitive status d/t aphasia.  Recommend ST f/u for ongoing cognitive assessment and aphasia tx in acute setting.  Thank you for this consult.    SLP Assessment  SLP Recommendation/Assessment: Patient needs continued Speech Lanaguage Pathology Services SLP Visit Diagnosis: Aphasia (R47.01);Cognitive communication deficit (R41.841)    Recommendations for follow up therapy are one component of a multi-disciplinary discharge planning process, led by the attending physician.  Recommendations may be updated based on patient status, additional functional criteria and insurance authorization.    Follow Up Recommendations  Acute inpatient rehab (3hours/day)    Assistance Recommended at Discharge  Frequent or constant Supervision/Assistance  Functional Status Assessment Patient has had a recent decline in their functional status and demonstrates the ability to make significant improvements in function in a reasonable and predictable amount of time.  Frequency and Duration min 2x/week  1 week      SLP  Evaluation Cognition  Overall Cognitive Status: No family/caregiver present to determine baseline cognitive functioning Arousal/Alertness: Awake/alert Orientation Level: Oriented to person;Oriented to place;Oriented to situation;Disoriented to time (may be impacted by expressive aphasia vs disorientation) Attention: Sustained Sustained Attention: Impaired Sustained Attention Impairment: Verbal basic;Functional basic (R inattention/neglect) Awareness: Appears intact Behaviors: Restless;Verbal agitation;Perseveration       Comprehension  Auditory Comprehension Overall Auditory Comprehension: Other (comment) (Appears WFL, but aphasia impacts performance intermittently) Commands: Not tested Conversation: Simple Other Conversation Comments: Expressive aphasia impacts Interfering Components: Attention EffectiveTechniques: Repetition Visual Recognition/Discrimination Discrimination: Exceptions to [facility] Other Visual Recogniton/Discrimination Comments: R inattention/aphasia Reading Comprehension Reading Status: Impaired Word level: Within functional limits Sentence Level: Impaired Functional Environmental (signs, name badge): Impaired Interfering Components: Right neglect/inattention;Attention;Other (comment) (aphasia) Effective Techniques: Verbal cueing;Visual cueing    Expression Expression Primary Mode of Expression: Verbal Verbal Expression Overall Verbal Expression: Impaired Initiation: Impaired Level of Generative/Spontaneous Verbalization: Phrase Repetition: Impaired Level of Impairment: Sentence level Naming: Impairment Responsive: 76-100% accurate Confrontation: Impaired Convergent: 0-24% accurate Divergent: 0-24% accurate Verbal Errors: Perseveration;Aware of errors;Phonemic paraphasias Pragmatics: Unable to assess Interfering Components: Attention;Premorbid deficit Effective Techniques: Semantic cues;Phonemic cues;Written cues Non-Verbal Means of Communication:  Gestures Written Expression Dominant Hand: Right Written Expression: Exceptions to [facility] Interfering Components: Attention;Right neglect/inattention;Thought organization;Other (comment) (aphasia)   Oral / Motor  Oral Motor/Sensory Function Overall Oral Motor/Sensory  Function: Other (comment) (Oral apraxia? unable to perform oral directives consistently) Motor Speech Overall Motor Speech: Other (comment) (DTA d/t aphasia interfering; potential for motor speech involvement d/t groping) Respiration: Within functional limits Phonation: Normal Resonance: Within functional limits Articulation: Within functional limitis Intelligibility: Intelligible Motor Planning: Impaired Level of Impairment: Word Motor Speech Errors: Groping for words Effective Techniques: Slow rate;Pause            Pat Yamilett Anastos,M.S.,CCC-SLP 04/13/2023, 10:31 AM

## 2023-04-14 ENCOUNTER — Inpatient Hospital Stay (HOSPITAL_COMMUNITY): Payer: Medicaid Other

## 2023-04-14 DIAGNOSIS — I639 Cerebral infarction, unspecified: Secondary | ICD-10-CM | POA: Diagnosis not present

## 2023-04-14 MED ORDER — LOSARTAN POTASSIUM 50 MG PO TABS
50.0000 mg | ORAL_TABLET | Freq: Every day | ORAL | Status: DC
  Administered 2023-04-14 – 2023-04-15 (×2): 50 mg via ORAL
  Filled 2023-04-14 (×2): qty 1

## 2023-04-14 MED ORDER — SENNA 8.6 MG PO TABS
1.0000 | ORAL_TABLET | Freq: Two times a day (BID) | ORAL | Status: DC
  Administered 2023-04-14 – 2023-04-15 (×4): 8.6 mg via ORAL
  Filled 2023-04-14 (×4): qty 1

## 2023-04-14 MED ORDER — AMLODIPINE BESYLATE 10 MG PO TABS
10.0000 mg | ORAL_TABLET | Freq: Every day | ORAL | Status: DC
  Administered 2023-04-14 – 2023-04-15 (×2): 10 mg via ORAL
  Filled 2023-04-14 (×2): qty 2

## 2023-04-14 MED ORDER — POLYETHYLENE GLYCOL 3350 17 G PO PACK
17.0000 g | PACK | Freq: Every day | ORAL | Status: DC
  Administered 2023-04-15: 17 g via ORAL
  Filled 2023-04-14: qty 1

## 2023-04-14 MED ORDER — HYDRALAZINE HCL 50 MG PO TABS
50.0000 mg | ORAL_TABLET | Freq: Three times a day (TID) | ORAL | Status: DC
  Administered 2023-04-14 – 2023-04-16 (×6): 50 mg via ORAL
  Filled 2023-04-14 (×6): qty 1

## 2023-04-14 MED ORDER — BISACODYL 10 MG RE SUPP: 10.0000 mg | Freq: Once | RECTAL | Status: DC

## 2023-04-14 MED ORDER — HYDROCHLOROTHIAZIDE 12.5 MG PO TABS
12.5000 mg | ORAL_TABLET | Freq: Every day | ORAL | Status: DC
  Administered 2023-04-14 – 2023-04-15 (×2): 12.5 mg via ORAL
  Filled 2023-04-14 (×2): qty 1

## 2023-04-14 NOTE — Progress Notes (Signed)
PROGRESS NOTE  Amany Rando  WUJ:[id number] DOB: 06-12-1965 DOA: 04/12/2023 PCP: Clayborne Dana, NP   Brief Narrative:  Serine Kea is a 58 y.o. female with medical history significant of stroke, hypertension, hyperlipidemia, obesity who presented from home with complaint of difficulty with her speech .  CT angiogram of the head and neck done at  [facility] showed areas of hypodensity in the left cerebral hemisphere, new from the prior exam , concerning for acute to subacute infarcts.  Also showed subsegmental occlusion of distal MCA branches.  Patient was transferred to [facility] for further workup with MRI and neurology evaluation.  MRI confirmed acute/subacute infarcts on the left cerebral hemisphere consistent with left MCA territory stroke.  Neurology consulted and following.  Started on stroke workup.  PT recommending acute inpatient rehab  Assessment & Plan:  Principal Problem:   Acute ischemic stroke [facility]) Active Problems:   Essential hypertension   Hypokalemia   GERD (gastroesophageal reflux disease)   History of CVA (cerebrovascular accident)   Acute ischemic stroke: Presented  with  speech changes(mainly word finding difficulty), some slurred speech, confusion for last 2 weeks.  CT head and neck showed areas of hypodensity in the left cerebral hemisphere, new from the prior exam , concerning for acute to subacute infarcts.  Also showed subsegmental occlusion of distal MCA branches.  Patient was transferred to [facility] for further workup with MRI and neurology evaluation.  Stroke workup initiated.  PT/OT/speech consulted, echo. A1c of 5.9, LDL of 124 Patient has history of prior stroke but without significant residual deficits.  Not taking any antiplatelet agents at home.  Ambulates with the help of walker. Started on aspirin and Plavix, continue Lipitor at high dose.  PT recommending acute inpatient rehab.  History of hypertension: Takes amlodipine, losartan, hydralazine  at home.  Restarted amlodipine, losartan. continue as needed medication for severe hypertension   Hyperlipidemia: Takes Lipitor at home.  Currently been continued   Hypokalemia: Supplemented with potassium and corrected   Mild AKI: Resolved with IV fluid   GERD: Continue Protonix   Marijuana use: UDS positive for THC  Abdominal pain: Unclear etiology.  Abdomen is soft, nontender, bowel sounds present.  Likely due to lack of bowel movement.  Stool softeners along with Dulcolax ordered.  Abdominal x-ray pending        DVT prophylaxis:enoxaparin (LOVENOX) injection 40 mg Start: 04/13/23 1000     Code Status: Prior  Family Communication: Called and discussed with mother Julieanne Cotton on phone on 1/24  Patient status:Inpatient  Patient is from :home  Anticipated discharge to: [facility] vs CIR  Estimated DC date:1-2 days   Consultants: Neurology  Procedures:None  Antimicrobials:  Anti-infectives (From admission, onward)    None       Subjective: Patient seen and examined at bedside today.  Alert and oriented but he still has word finding difficulty.  Started having abdominal discomfort since last night.  No bowel movement for last few days  Objective: Vitals:   04/13/23 2010 04/13/23 2337 04/14/23 0336 04/14/23 0839  BP:  (!) 146/85 (!) 184/82 (!) 217/128  Pulse:  75 88 71  Resp:  18 18 18   Temp:  99.3 F (37.4 C) 98.1 F (36.7 C) 98 F (36.7 C)  TempSrc:  Oral Oral   SpO2: 92% 99% 96% 95%  Weight:      Height:       No intake or output data in the 24 hours ending 04/14/23 1110  [employer]  04/12/23 2200  Weight: 73 kg    Examination:  General exam: Overall comfortable, not in distress HEENT: PERRL Respiratory system:  no wheezes or crackles  Cardiovascular system: S1 & S2 heard, RRR.  Gastrointestinal system: Abdomen is nondistended, soft and nontender. Central nervous system: Alert and oriented, dysarthria, word finding difficulty, weakness of the  left lower extremity Extremities: No edema, no clubbing ,no cyanosis Skin: No rashes, no ulcers,no icterus       Data Reviewed: I have personally reviewed following labs and imaging studies  CBC: Recent Labs  Lab 04/12/23 1255 04/13/23 0642  WBC 6.7 5.7  NEUTROABS 3.7  --   HGB 15.0 13.3  HCT 45.2 39.3  MCV 87.9 87.3  PLT 176 158   Basic Metabolic Panel: Recent Labs  Lab 04/12/23 1255 04/13/23 0642  NA 135 140  K 3.3* 3.8  CL 101 109  CO2 20* 23  GLUCOSE 161* 98  BUN 12 9  CREATININE 1.11* 1.00  CALCIUM 9.3 8.9     No results found for this or any previous visit (from the past 240 hours).   Radiology Studies: ECHOCARDIOGRAM COMPLETE Result Date: 04/13/2023    ECHOCARDIOGRAM REPORT   Patient Name:   Therma Lasure Date of Exam: 04/13/2023 Medical Rec #:  [id number]             Height:       62.0 in Accession #:    [id number]            Weight:       160.9 lb Date of Birth:  12/10/1965             BSA:          1.743 m Patient Age:    57 years              BP:           171/73 mmHg Patient Gender: F                     HR:           60 bpm. Exam Location:  Inpatient Procedure: 2D Echo, Cardiac Doppler and Color Doppler Indications:    Stroke  History:        Patient has prior history of Echocardiogram examinations, most                 recent 12/07/2022. Stroke, Arrythmias:Tachycardia; Risk                 Factors:Hypertension, Former Smoker and Dyslipidemia.  Sonographer:    Vern Claude Referring Phys: Burnadette Pop IMPRESSIONS  1. Mild mid cavitary gradient. Peak velocity 0.94 m/s. Peak gradient 3.5 mmHg. With Valsalva, velocity increases to 1.2 m/s and peak gradient 5.6 mmHg. Left ventricular ejection fraction, by estimation, is 60 to 65%. The left ventricle has normal function. The left ventricle has no regional wall motion abnormalities. There is severe left ventricular hypertrophy. Left ventricular diastolic parameters are consistent with Grade I diastolic dysfunction  (impaired relaxation).  2. Right ventricular systolic function is normal. The right ventricular size is normal.  3. Left atrial size was moderately dilated.  4. The mitral valve is normal in structure. No evidence of mitral valve regurgitation. No evidence of mitral stenosis.  5. The aortic valve is normal in structure. Aortic valve regurgitation is not visualized. No aortic stenosis is present.  6. The inferior vena cava is normal in size with greater  than 50% respiratory variability, suggesting right atrial pressure of 3 mmHg. FINDINGS  Left Ventricle: Mild mid cavitary gradient. Peak velocity 0.94 m/s. Peak gradient 3.5 mmHg. With Valsalva, velocity increases to 1.2 m/s and peak gradient 5.6 mmHg. Left ventricular ejection fraction, by estimation, is 60 to 65%. The left ventricle has normal function. The left ventricle has no regional wall motion abnormalities. The left ventricular internal cavity size was normal in size. There is severe left ventricular hypertrophy. Left ventricular diastolic parameters are consistent with Grade I diastolic dysfunction (impaired relaxation). Normal left ventricular filling pressure. Right Ventricle: The right ventricular size is normal. No increase in right ventricular wall thickness. Right ventricular systolic function is normal. Left Atrium: Left atrial size was moderately dilated. Right Atrium: Right atrial size was normal in size. Pericardium: There is no evidence of pericardial effusion. Mitral Valve: The mitral valve is normal in structure. No evidence of mitral valve regurgitation. No evidence of mitral valve stenosis. MV peak gradient, 3.9 mmHg. The mean mitral valve gradient is 1.0 mmHg. Tricuspid Valve: The tricuspid valve is normal in structure. Tricuspid valve regurgitation is not demonstrated. No evidence of tricuspid stenosis. Aortic Valve: The aortic valve is normal in structure. Aortic valve regurgitation is not visualized. No aortic stenosis is present. Aortic  valve mean gradient measures 3.0 mmHg. Aortic valve peak gradient measures 6.6 mmHg. Aortic valve area, by VTI measures 1.58 cm. Pulmonic Valve: The pulmonic valve was normal in structure. Pulmonic valve regurgitation is not visualized. No evidence of pulmonic stenosis. Aorta: The aortic root is normal in size and structure. Venous: The inferior vena cava is normal in size with greater than 50% respiratory variability, suggesting right atrial pressure of 3 mmHg. IAS/Shunts: No atrial level shunt detected by color flow Doppler.  LEFT VENTRICLE PLAX 2D LVIDd:         3.40 cm      Diastology LVIDs:         2.10 cm      LV e' medial:    6.42 cm/s LV PW:         1.30 cm      LV E/e' medial:  10.3 LV IVS:        1.80 cm      LV e' lateral:   6.96 cm/s LVOT diam:     1.60 cm      LV E/e' lateral: 9.5 LV SV:         35 LV SV Index:   20 LVOT Area:     2.01 cm  LV Volumes (MOD) LV vol d, MOD A2C: 106.0 ml LV vol d, MOD A4C: 140.0 ml LV vol s, MOD A2C: 38.6 ml LV vol s, MOD A4C: 42.7 ml LV SV MOD A2C:     67.4 ml LV SV MOD A4C:     140.0 ml LV SV MOD BP:      79.7 ml RIGHT VENTRICLE             IVC RV Basal diam:  2.90 cm     IVC diam: 1.40 cm RV Mid diam:    1.60 cm RV S prime:     11.90 cm/s TAPSE (M-mode): 2.4 cm LEFT ATRIUM             Index        RIGHT ATRIUM          Index LA diam:        3.70 cm 2.12 cm/m   RA Area:  7.30 cm LA Vol (A2C):   17.1 ml 9.81 ml/m   RA Volume:   11.70 ml 6.71 ml/m LA Vol (A4C):   61.0 ml 35.00 ml/m LA Biplane Vol: 34.4 ml 19.74 ml/m  AORTIC VALVE                    PULMONIC VALVE AV Area (Vmax):    1.96 cm     PV Vmax:       0.75 m/s AV Area (Vmean):   1.41 cm     PV Peak grad:  2.3 mmHg AV Area (VTI):     1.58 cm AV Vmax:           128.00 cm/s AV Vmean:          82.200 cm/s AV VTI:            0.224 m AV Peak Grad:      6.6 mmHg AV Mean Grad:      3.0 mmHg LVOT Vmax:         125.00 cm/s LVOT Vmean:        57.600 cm/s LVOT VTI:          0.176 m LVOT/AV VTI ratio: 0.79  AORTA Ao  Root diam: 3.10 cm Ao Asc diam:  2.60 cm MITRAL VALVE MV Area (PHT): 2.59 cm    SHUNTS MV Area VTI:   1.44 cm    Systemic VTI:  0.18 m MV Peak grad:  3.9 mmHg    Systemic Diam: 1.60 cm MV Mean grad:  1.0 mmHg MV Vmax:       0.98 m/s MV Vmean:      55.4 cm/s MV Decel Time: 293 msec MV E velocity: 65.90 cm/s MV A velocity: 91.30 cm/s MV E/A ratio:  0.72 Chilton Si MD Electronically signed by Chilton Si MD Signature Date/Time: 04/13/2023/4:04:49 PM    Final    MR Brain Wo Contrast (neuro protocol) Result Date: 04/12/2023 CLINICAL DATA:  Initial evaluation for acute neuro deficit, stroke suspected. EXAM: MRI HEAD WITHOUT CONTRAST TECHNIQUE: Multiplanar, multiecho pulse sequences of the brain and surrounding structures were obtained without intravenous contrast. COMPARISON:  Prior CT from earlier the same day. FINDINGS: Brain: Cerebral volume within normal limits. No significant cerebral white matter disease for age. Chronic left cerebellar infarct noted. Scattered foci of restricted diffusion are seen involving the left cerebral hemisphere, corresponding with hypodensity seen on prior CT. Findings consistent with acute to early subacute ischemic infarcts, left MCA distribution. No associated hemorrhage or significant mass effect. There are a few underlying late subacute to chronic infarcts superimposed on this region as well (series 5, image 81, 79 for example). No other evidence for acute or subacute ischemia. No acute intracranial hemorrhage. No mass lesion or midline shift. No hydrocephalus or extra-axial fluid collection. Pituitary gland within normal limits. Vascular: Poor visualization of the left MCA branches, corresponding with attenuated flow noted on prior CT. Major intracranial vascular flow voids are otherwise maintained. Skull and upper cervical spine: Craniocervical junction within normal limits. Bone marrow signal intensity normal. No scalp soft tissue abnormality. Sinuses/Orbits: Globes  orbital soft tissues demonstrate no acute finding. Paranasal sinuses are clear. No mastoid effusion. Other: Few subcentimeter T2 hyperintense nodular densities noted about the parotid glands, nonspecific, but possibly small intraparotid lymph nodes. IMPRESSION: 1. Scattered acute to early subacute ischemic infarcts involving the left cerebral hemisphere, left MCA distribution. No associated hemorrhage or significant mass effect. 2. Few scattered superimposed late subacute to  chronic left MCA distribution infarcts. 3. Chronic left cerebellar infarct. Electronically Signed   By: Rise Mu M.D.   On: 04/12/2023 19:28   CT ANGIO HEAD NECK W WO CM Result Date: 04/12/2023 CLINICAL DATA:  Loss of cognitive skills, slurred speech EXAM: CT ANGIOGRAPHY HEAD AND NECK WITH AND WITHOUT CONTRAST TECHNIQUE: Multidetector CT imaging of the head and neck was performed using the standard protocol during bolus administration of intravenous contrast. Multiplanar CT image reconstructions and MIPs were obtained to evaluate the vascular anatomy. Carotid stenosis measurements (when applicable) are obtained utilizing NASCET criteria, using the distal internal carotid diameter as the denominator. RADIATION DOSE REDUCTION: This exam was performed according to the departmental dose-optimization program which includes automated exposure control, adjustment of the mA and/or kV according to patient size and/or use of iterative reconstruction technique. CONTRAST:  75mL OMNIPAQUE IOHEXOL 350 MG/ML SOLN COMPARISON:  No prior CTA available, correlation is made with 03/05/2018 CT head and 12/06/2016 MRA head FINDINGS: CT HEAD FINDINGS Brain: Areas of hypodensity left cerebral hemisphere (series 2, images 17, 19, 21, and 22), which are new from the prior exam and concerning for acute to subacute infarcts. No evidence of acute hemorrhage, mass, mass effect, or midline shift. No hydrocephalus or extra-axial fluid collection. Vascular: No  hyperdense vessel. Skull: Negative for fracture or focal lesion. Sinuses/Orbits: No acute finding. Other: The mastoid air cells are well aerated. CTA NECK FINDINGS Aortic arch: Two-vessel arch with a common origin of the brachiocephalic and left common carotid arteries. Imaged portion shows no evidence of aneurysm or dissection. No significant stenosis of the major arch vessel origins. Right carotid system: No evidence of dissection, occlusion, or hemodynamically significant stenosis (greater than 50%). Left carotid system: No evidence of dissection, occlusion, or hemodynamically significant stenosis (greater than 50%). Vertebral arteries: No evidence of dissection, occlusion, or hemodynamically significant stenosis (greater than 50%). Skeleton: No acute osseous abnormality. Degenerative changes in the cervical spine. Other neck: 3 mm hypoenhancing focus in the right thyroid lobe, for which no follow-up is currently indicated. Upper chest: No focal pulmonary opacity or pleural effusion. Review of the MIP images confirms the above findings CTA HEAD FINDINGS Evaluation is somewhat limited by venous contamination. Anterior circulation: Both internal carotid arteries are patent to the termini, without significant stenosis. A1 segments patent. Normal anterior communicating artery. Anterior cerebral arteries are patent to their distal aspects without significant stenosis. Short segment occlusion or near occlusive stenosis in left M1 (series 605, images 126-128), with possible reconstitution in the proximal more posterior left M2 (series 605, image 126). The anterior M2 is not well seen. Additional severe stenosis in the insular left M2 (series 605, image 121 and 124), with overall poor opacification distal left MCA branches. Mild stenosis in the proximal right M 1 (series 605, image 129). Right MCA branches are irregular but grossly perfused without high-grade stenosis. Posterior circulation: Vertebral arteries patent to  the vertebrobasilar junction without significant stenosis. Basilar patent to its distal aspect without significant stenosis. Superior cerebellar arteries patent proximally. Patent P1 segments. PCAs perfused to their distal aspects without significant stenosis. The bilateral posterior communicating arteries are not visualized. Venous sinuses: Well opacified, patent. Anatomic variants: None significant. No evidence of aneurysm or vascular malformation. Review of the MIP images confirms the above findings IMPRESSION: 1. Areas of hypodensity in the left cerebral hemisphere, which are new from the prior exam and concerning for acute to subacute infarcts. An MRI is recommended for further evaluation. 2. Short segment occlusion or  near occlusive stenosis in left M1, with possible reconstitution in the proximal left M2. The anterior M2 is not well seen. Additional severe stenosis in the insular left M2, with overall poor opacification of distal left MCA branches. 3. No hemodynamically significant stenosis in the neck. These results were called by telephone at the time of interpretation on 04/12/2023 at 2:53 pm to provider Edwin Dada , who verbally acknowledged these results. Electronically Signed   By: Wiliam Ke M.D.   On: 04/12/2023 14:54    Scheduled Meds:  amLODipine  10 mg Oral Daily   aspirin  81 mg Oral Daily   atorvastatin  80 mg Oral Daily   bisacodyl  10 mg Rectal Once   cholecalciferol  800 Units Oral Daily   clopidogrel  75 mg Oral Daily   enoxaparin (LOVENOX) injection  40 mg Subcutaneous Q24H   gabapentin  300 mg Oral TID   losartan  50 mg Oral Daily   pantoprazole  40 mg Oral Daily   polyethylene glycol  17 g Oral Daily   senna  1 tablet Oral BID   Continuous Infusions:     LOS: 1 day   Burnadette Pop, MD [facility] P1/25/2025, 11:10 AM

## 2023-04-14 NOTE — Progress Notes (Signed)
STROKE TEAM PROGRESS NOTE   BRIEF HPI [name] is a 58 y.o. 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 CT head revealed left MCA subacute infarct. CTA revealed short segment occlusion or near occlusive stenosis in left M1, with possible reconstitution in the proximal left M2.    SIGNIFICANT HOSPITAL EVENTS 1/24 admitted with left MCA subacute infarct  INTERIM HISTORY/SUBJECTIVE No acute changes over night. Patient continues to have expressive aphasia. She is able to follow commands and name certain objects.    OBJECTIVE  CBC    Component Value Date/Time   WBC 5.7 04/13/2023 0642   RBC 4.50 04/13/2023 0642   HGB 13.3 04/13/2023 0642   HGB 15.1 12/01/2021 1410   HCT 39.3 04/13/2023 0642   HCT 46.1 12/01/2021 1410   PLT 158 04/13/2023 0642   PLT 164 12/01/2021 1410   MCV 87.3 04/13/2023 0642   MCV 87 12/01/2021 1410   MCH 29.6 04/13/2023 0642   MCHC 33.8 04/13/2023 0642   RDW 15.0 04/13/2023 0642   RDW 14.4 12/01/2021 1410   LYMPHSABS 2.4 04/12/2023 1255   LYMPHSABS 2.4 02/04/2019 1450   MONOABS 0.5 04/12/2023 1255   EOSABS 0.1 04/12/2023 1255   EOSABS 0.1 02/04/2019 1450   BASOSABS 0.0 04/12/2023 1255   BASOSABS 0.0 02/04/2019 1450    BMET    Component Value Date/Time   NA 140 04/13/2023 0642   NA 147 (H) 12/01/2021 1410   K 3.8 04/13/2023 0642   CL 109 04/13/2023 0642   CO2 23 04/13/2023 0642   GLUCOSE 98 04/13/2023 0642   BUN 9 04/13/2023 0642   BUN 8 12/01/2021 1410   CREATININE 1.00 04/13/2023 0642   CALCIUM 8.9 04/13/2023 0642   EGFR 71 12/01/2021 1410   GFRNONAA >60 04/13/2023 0642    IMAGING past 24 hours No results found.  Vitals:   04/13/23 2010 04/13/23 2337 04/14/23 0336 04/14/23 0839  BP:  (!) 146/85 (!) 184/82 (!) 217/128  Pulse:  75 88 71  Resp:  18 18 18   Temp:   99.3 F (37.4 C) 98.1 F (36.7 C) 98 F (36.7 C)  TempSrc:  Oral Oral   SpO2: 92% 99% 96% 95%  Weight:      Height:         PHYSICAL EXAM General:  Alert, well-nourished, well-developed patient in no acute distress Psych:  Mood and affect appropriate for situation CV: Regular rate and rhythm on monitor Respiratory:  Regular, unlabored respirations on room air GI: Abdomen soft and nontender   NEURO:  Mental Status: Expressive aphasia, able to state her name, can name a thumb and a ring, can't name city, repetition intact. Able to follow simple commands.   Cranial Nerves:  II: PERRL. Visual fields full.  III, IV, VI: EOMI. Eyelids elevate symmetrically.  V: Sensation is intact to light touch and symmetrical to face.  VII: Face is symmetrical resting and smiling VIII: hearing intact to voice. IX, X: Palate elevates symmetrically. Phonation is normal.  WJ:[id number] shrug 5/5. XII: tongue is midline without fasciculations. Motor: 4-/5 in RUE, otherwise 4/5 strength to all other muscle groups tested.  Tone: is normal and bulk is normal Sensation- Intact to light touch bilaterally. Extinction absent to light touch to DSS.   Coordination: FTN intact bilaterally, HKS: no ataxia in BLE.No drift.  Gait- deferred  ASSESSMENT/PLAN  Acute Ischemic Infarct:  left MCA territory ischemic stroke with expressive aphasia and  RUE weakness Etiology:  cardioembolic vs large vessel atherosclerotic disease, will need ziopatch on discharge for 1 month.  CTA head & neck  short segment occlusion or near occlusive stenosis in left M1, with possible reconstitution in the proximal left M2 MRI scattered acute/subacute ischemic infarcts in left cerebral hemisphere.  Few scattered late subacute to chronic MCA infarcts 2D Echo EF 60 to 65%.  LV with severe hypertrophy and grade 1 diastolic dysfunction LDL 124 HgbA1c 5.9 VTE prophylaxis -Lovenox No antithrombotic prior to admission, now on aspirin 81 mg  daily and clopidogrel 75 mg daily for 3 weeks and then aspirin alone. 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 No TOC consult for cessation placed  Dysphagia Patient has post-stroke dysphagia, SLP consulted    Diet   Diet Heart Room service appropriate? Yes; Fluid consistency: Thin   Advance diet as tolerated  Other Stroke Risk Factors No Family hx stroke No Coronary artery disease No Congestive heart failure No Obstructive sleep apnea, No Migraines   Other Active Problems GERD AKI  Hospital day # 1  Anibal Henderson, MD [facility]  To contact Stroke Continuity provider, please refer to [email or web address]. After hours, contact General Neurology

## 2023-04-14 NOTE — Plan of Care (Signed)

## 2023-04-15 DIAGNOSIS — I639 Cerebral infarction, unspecified: Secondary | ICD-10-CM | POA: Diagnosis not present

## 2023-04-15 MED ORDER — BISACODYL 10 MG RE SUPP
10.0000 mg | Freq: Once | RECTAL | Status: AC
  Administered 2023-04-15: 10 mg via RECTAL
  Filled 2023-04-15: qty 1

## 2023-04-15 MED ORDER — ALPRAZOLAM 0.25 MG PO TABS
0.2500 mg | ORAL_TABLET | Freq: Three times a day (TID) | ORAL | Status: DC | PRN
  Administered 2023-04-15 (×2): 0.25 mg via ORAL
  Filled 2023-04-15 (×2): qty 1

## 2023-04-15 MED ORDER — ACETAMINOPHEN 325 MG PO TABS: 650.0000 mg | ORAL_TABLET | Freq: Four times a day (QID) | ORAL | Status: DC | PRN

## 2023-04-15 MED ORDER — ONDANSETRON HCL 4 MG/2ML IJ SOLN
4.0000 mg | Freq: Four times a day (QID) | INTRAMUSCULAR | Status: DC | PRN
  Administered 2023-04-16 – 2023-04-20 (×3): 4 mg via INTRAVENOUS
  Filled 2023-04-15 (×3): qty 2

## 2023-04-15 MED ORDER — OXYCODONE-ACETAMINOPHEN 5-325 MG PO TABS
1.0000 | ORAL_TABLET | Freq: Four times a day (QID) | ORAL | Status: DC | PRN
  Administered 2023-04-15: 1 via ORAL
  Filled 2023-04-15: qty 1

## 2023-04-15 MED ORDER — NALOXONE HCL 0.4 MG/ML IJ SOLN: 0.4000 mg | INTRAMUSCULAR | Status: DC | PRN

## 2023-04-15 NOTE — Plan of Care (Signed)
  Problem: Ischemic Stroke/TIA Tissue Perfusion: Goal: Complications of ischemic stroke/TIA will be minimized Outcome: Progressing   Problem: Self-Care: Goal: Ability to participate in self-care as condition permits will improve Outcome: Progressing Goal: Ability to communicate needs accurately will improve Outcome: Progressing   Problem: Nutrition: Goal: Risk of aspiration will decrease Outcome: Progressing Goal: Dietary intake will improve Outcome: Progressing   Problem: Activity: Goal: Risk for activity intolerance will decrease Outcome: Progressing   Problem: Coping: Goal: Level of anxiety will decrease Outcome: Progressing   Problem: Pain Managment: Goal: General experience of comfort will improve and/or be controlled Outcome: Progressing

## 2023-04-15 NOTE — Progress Notes (Signed)
TR[facility] night cross cover note:   I was notified by RN of the patient's history of nausea/vomiting to Percocet, which is currently ordered prn for this patient.  However, she is only required 1 dose of prn Percocet which occurred around 3 AM this morning, and has not required any pain medications during dayshift.  I subsequently discontinued the existing order for prn Percocet and added prn acetaminophen pain.    Newton Pigg, DO Hospitalist

## 2023-04-15 NOTE — Progress Notes (Signed)
PROGRESS NOTE  Brittany [name]  ZOX:[id number] DOB: May 09, 1965 DOA: 04/12/2023 PCP: Brittany Dana, NP   Brief Narrative:  Brittany [name] is a 58 y.o. female with medical history significant of stroke, hypertension, hyperlipidemia, obesity who presented from home with complaint of difficulty with her speech .  CT angiogram of the head and neck done at  [facility] showed areas of hypodensity in the left cerebral hemisphere, new from the prior exam , concerning for acute to subacute infarcts.  Also showed subsegmental occlusion of distal MCA branches.  Patient was transferred to [facility] for further workup with MRI and neurology evaluation.  MRI confirmed acute/subacute infarcts on the left cerebral hemisphere consistent with left MCA territory stroke.  Neurology consulted and following.  Started on stroke workup.  PT recommending acute inpatient rehab.  Medically stable for discharge whenever possible  Assessment & Plan:  Principal Problem:   Acute ischemic stroke [facility]) Active Problems:   Essential hypertension   Hypokalemia   GERD (gastroesophageal reflux disease)   History of CVA (cerebrovascular accident)   Acute ischemic stroke: Presented  with  speech changes(mainly word finding difficulty), some slurred speech, confusion for last 2 weeks.  CT head and neck showed areas of hypodensity in the left cerebral hemisphere, new from the prior exam , concerning for acute to subacute infarcts.  Also showed subsegmental occlusion of distal MCA branches.  Patient was transferred to [facility] for further workup with MRI and neurology evaluation.  Stroke workup initiated.  PT/OT/speech consulted, echo. A1c of 5.9, LDL of 124 Patient has history of prior stroke but without significant residual deficits.  Not taking any antiplatelet agents at home.  Ambulates with the help of walker. Started on aspirin and Plavix, continue Lipitor at high dose.  Neurology recommending aspirin and Plavix for 3 weeks  followed by aspirin alone. PT recommending acute inpatient rehab.  History of hypertension: Takes amlodipine, losartan, hydralazine at home.  Restarted amlodipine, losartan, hydralazine, hydrochlorothiazide.  Blood pressure better now  Hyperlipidemia: Takes Lipitor at home.  Currently been continued   Hypokalemia: Supplemented with potassium and corrected   Mild AKI: Resolved with IV fluid   GERD: Continue Protonix   Marijuana use: UDS positive for THC  Abdominal pain/constipation: Resolved.  Abdominal x-ray did not show any acute findings.  Continue bowel regimen        DVT prophylaxis:enoxaparin (LOVENOX) injection 40 mg Start: 04/13/23 1000     Code Status: Prior  Family Communication: Called and discussed with mother Brittany [name] on phone on 1/24  Patient status:Inpatient  Patient is from :home  Anticipated discharge to: [facility] vs CIR  Estimated DC date :whenever  possible   Consultants: Neurology  Procedures:None  Antimicrobials:  Anti-infectives (From admission, onward)    None       Subjective: Patient seen and examined at bedside today.  Hemodynamically stable.  Feels much better today.  Still has word finding difficulty, dysarthria.  No bowel movement for last few days.  No abdominal  pain, nausea or vomiting today  Objective: Vitals:   04/14/23 1955 04/14/23 2332 04/15/23 0400 04/15/23 0830  BP: (!) 153/69 (!) 160/66 115/61 (!) 147/74  Pulse: 81 75 67 (!) 108  Resp: 18 18 18 16   Temp: 98.3 F (36.8 C) 98.4 F (36.9 C) 97.7 F (36.5 C) 98.9 F (37.2 C)  TempSrc: Oral Oral Oral Oral  SpO2: 99% 99% 95% 99%  Weight:      Height:       No  intake or output data in the 24 hours ending 04/15/23 1023  Filed Weights   04/12/23 2200  Weight: 73 kg    Examination:   General exam: Overall comfortable, not in distress HEENT: PERRL Respiratory system:  no wheezes or crackles  Cardiovascular system: S1 & S2 heard, RRR.  Gastrointestinal system:  Abdomen is nondistended, soft and nontender. Central nervous system: Alert and oriented, left residual hemiparesis, dysarthria, word finding difficulty Extremities: No edema, no clubbing ,no cyanosis Skin: No rashes, no ulcers,no icterus        Data Reviewed: I have personally reviewed following labs and imaging studies  CBC: Recent Labs  Lab 04/12/23 1255 04/13/23 0642  WBC 6.7 5.7  NEUTROABS 3.7  --   HGB 15.0 13.3  HCT 45.2 39.3  MCV 87.9 87.3  PLT 176 158   Basic Metabolic Panel: Recent Labs  Lab 04/12/23 1255 04/13/23 0642  NA 135 140  K 3.3* 3.8  CL 101 109  CO2 20* 23  GLUCOSE 161* 98  BUN 12 9  CREATININE 1.11* 1.00  CALCIUM 9.3 8.9     No results found for this or any previous visit (from the past 240 hours).   Radiology Studies: DG Abd Portable 1V Result Date: 04/14/2023 CLINICAL DATA:  Abdominal pain. EXAM: PORTABLE ABDOMEN - 1 VIEW COMPARISON:  CT 05/30/2018 FINDINGS: No bowel dilatation to suggest obstruction. Air within prominent but not abnormally dilated transverse and descending colon. Formed stool in the ascending colon. No abnormal rectal distention. Pelvic phleboliths. No abdominal radiopaque calculi. On limited assessment, no acute osseous findings. IMPRESSION: Normal bowel gas pattern.  No explanation for pain. Electronically Signed   By: Brittany [name] M.D.   On: 04/14/2023 15:03   ECHOCARDIOGRAM COMPLETE Result Date: 04/13/2023    ECHOCARDIOGRAM REPORT   Patient Name:   Brittany [name] Date of Exam: 04/13/2023 Medical Rec #:  [id number]             Height:       62.0 in Accession #:    [id number]            Weight:       160.9 lb Date of Birth:  21-Nov-1965             BSA:          1.743 m Patient Age:    57 years              BP:           171/73 mmHg Patient Gender: F                     HR:           60 bpm. Exam Location:  Inpatient Procedure: 2D Echo, Cardiac Doppler and Color Doppler Indications:    Stroke  History:        Patient has  prior history of Echocardiogram examinations, most                 recent 12/07/2022. Stroke, Arrythmias:Tachycardia; Risk                 Factors:Hypertension, Former Smoker and Dyslipidemia.  Sonographer:    Vern Claude Referring Phys: Burnadette Pop IMPRESSIONS  1. Mild mid cavitary gradient. Peak velocity 0.94 m/s. Peak gradient 3.5 mmHg. With Valsalva, velocity increases to 1.2 m/s and peak gradient 5.6 mmHg. Left ventricular ejection fraction, by estimation, is 60 to 65%. The left ventricle has  normal function. The left ventricle has no regional wall motion abnormalities. There is severe left ventricular hypertrophy. Left ventricular diastolic parameters are consistent with Grade I diastolic dysfunction (impaired relaxation).  2. Right ventricular systolic function is normal. The right ventricular size is normal.  3. Left atrial size was moderately dilated.  4. The mitral valve is normal in structure. No evidence of mitral valve regurgitation. No evidence of mitral stenosis.  5. The aortic valve is normal in structure. Aortic valve regurgitation is not visualized. No aortic stenosis is present.  6. The inferior vena cava is normal in size with greater than 50% respiratory variability, suggesting right atrial pressure of 3 mmHg. FINDINGS  Left Ventricle: Mild mid cavitary gradient. Peak velocity 0.94 m/s. Peak gradient 3.5 mmHg. With Valsalva, velocity increases to 1.2 m/s and peak gradient 5.6 mmHg. Left ventricular ejection fraction, by estimation, is 60 to 65%. The left ventricle has normal function. The left ventricle has no regional wall motion abnormalities. The left ventricular internal cavity size was normal in size. There is severe left ventricular hypertrophy. Left ventricular diastolic parameters are consistent with Grade I diastolic dysfunction (impaired relaxation). Normal left ventricular filling pressure. Right Ventricle: The right ventricular size is normal. No increase in right ventricular wall  thickness. Right ventricular systolic function is normal. Left Atrium: Left atrial size was moderately dilated. Right Atrium: Right atrial size was normal in size. Pericardium: There is no evidence of pericardial effusion. Mitral Valve: The mitral valve is normal in structure. No evidence of mitral valve regurgitation. No evidence of mitral valve stenosis. MV peak gradient, 3.9 mmHg. The mean mitral valve gradient is 1.0 mmHg. Tricuspid Valve: The tricuspid valve is normal in structure. Tricuspid valve regurgitation is not demonstrated. No evidence of tricuspid stenosis. Aortic Valve: The aortic valve is normal in structure. Aortic valve regurgitation is not visualized. No aortic stenosis is present. Aortic valve mean gradient measures 3.0 mmHg. Aortic valve peak gradient measures 6.6 mmHg. Aortic valve area, by VTI measures 1.58 cm. Pulmonic Valve: The pulmonic valve was normal in structure. Pulmonic valve regurgitation is not visualized. No evidence of pulmonic stenosis. Aorta: The aortic root is normal in size and structure. Venous: The inferior vena cava is normal in size with greater than 50% respiratory variability, suggesting right atrial pressure of 3 mmHg. IAS/Shunts: No atrial level shunt detected by color flow Doppler.  LEFT VENTRICLE PLAX 2D LVIDd:         3.40 cm      Diastology LVIDs:         2.10 cm      LV e' medial:    6.42 cm/s LV PW:         1.30 cm      LV E/e' medial:  10.3 LV IVS:        1.80 cm      LV e' lateral:   6.96 cm/s LVOT diam:     1.60 cm      LV E/e' lateral: 9.5 LV SV:         35 LV SV Index:   20 LVOT Area:     2.01 cm  LV Volumes (MOD) LV vol d, MOD A2C: 106.0 ml LV vol d, MOD A4C: 140.0 ml LV vol s, MOD A2C: 38.6 ml LV vol s, MOD A4C: 42.7 ml LV SV MOD A2C:     67.4 ml LV SV MOD A4C:     140.0 ml LV SV MOD BP:  79.7 ml RIGHT VENTRICLE             IVC RV Basal diam:  2.90 cm     IVC diam: 1.40 cm RV Mid diam:    1.60 cm RV S prime:     11.90 cm/s TAPSE (M-mode): 2.4 cm LEFT  ATRIUM             Index        RIGHT ATRIUM          Index LA diam:        3.70 cm 2.12 cm/m   RA Area:     7.30 cm LA Vol (A2C):   17.1 ml 9.81 ml/m   RA Volume:   11.70 ml 6.71 ml/m LA Vol (A4C):   61.0 ml 35.00 ml/m LA Biplane Vol: 34.4 ml 19.74 ml/m  AORTIC VALVE                    PULMONIC VALVE AV Area (Vmax):    1.96 cm     PV Vmax:       0.75 m/s AV Area (Vmean):   1.41 cm     PV Peak grad:  2.3 mmHg AV Area (VTI):     1.58 cm AV Vmax:           128.00 cm/s AV Vmean:          82.200 cm/s AV VTI:            0.224 m AV Peak Grad:      6.6 mmHg AV Mean Grad:      3.0 mmHg LVOT Vmax:         125.00 cm/s LVOT Vmean:        57.600 cm/s LVOT VTI:          0.176 m LVOT/AV VTI ratio: 0.79  AORTA Ao Root diam: 3.10 cm Ao Asc diam:  2.60 cm MITRAL VALVE MV Area (PHT): 2.59 cm    SHUNTS MV Area VTI:   1.44 cm    Systemic VTI:  0.18 m MV Peak grad:  3.9 mmHg    Systemic Diam: 1.60 cm MV Mean grad:  1.0 mmHg MV Vmax:       0.98 m/s MV Vmean:      55.4 cm/s MV Decel Time: 293 msec MV E velocity: 65.90 cm/s MV A velocity: 91.30 cm/s MV E/A ratio:  0.72 Chilton Si MD Electronically signed by Chilton Si MD Signature Date/Time: 04/13/2023/4:04:49 PM    Final     Scheduled Meds:  amLODipine  10 mg Oral Daily   aspirin  81 mg Oral Daily   atorvastatin  80 mg Oral Daily   cholecalciferol  800 Units Oral Daily   clopidogrel  75 mg Oral Daily   enoxaparin (LOVENOX) injection  40 mg Subcutaneous Q24H   gabapentin  300 mg Oral TID   hydrALAZINE  50 mg Oral Q8H   hydrochlorothiazide  12.5 mg Oral Daily   losartan  50 mg Oral Daily   pantoprazole  40 mg Oral Daily   polyethylene glycol  17 g Oral Daily   senna  1 tablet Oral BID   Continuous Infusions:     LOS: 2 days   Burnadette Pop, MD [facility] P1/26/2025, 10:23 AM

## 2023-04-15 NOTE — Plan of Care (Signed)
Problem: Education: Goal: Knowledge of disease or condition will improve 04/15/2023 1534 by Brittany Coe, RN Outcome: Progressing 04/15/2023 0858 by Brittany Coe, RN Outcome: Progressing Goal: Knowledge of secondary prevention will improve (MUST DOCUMENT ALL) 04/15/2023 1534 by Brittany Coe, RN Outcome: Progressing 04/15/2023 0858 by Brittany Coe, RN Outcome: Progressing Goal: Knowledge of patient specific risk factors will improve (DELETE if not current risk factor) 04/15/2023 1534 by Brittany Coe, RN Outcome: Progressing 04/15/2023 0858 by Brittany Coe, RN Outcome: Progressing   Problem: Ischemic Stroke/TIA Tissue Perfusion: Goal: Complications of ischemic stroke/TIA will be minimized 04/15/2023 1534 by Brittany Coe, RN Outcome: Progressing 04/15/2023 0858 by Brittany Coe, RN Outcome: Progressing   Problem: Coping: Goal: Will verbalize positive feelings about self 04/15/2023 1534 by Brittany Coe, RN Outcome: Progressing 04/15/2023 0858 by Brittany Coe, RN Outcome: Progressing Goal: Will identify appropriate support needs 04/15/2023 1534 by Brittany Coe, RN Outcome: Progressing 04/15/2023 0858 by Brittany Coe, RN Outcome: Progressing   Problem: Health Behavior/Discharge Planning: Goal: Ability to manage health-related needs will improve 04/15/2023 1534 by Brittany Coe, RN Outcome: Progressing 04/15/2023 0858 by Brittany Coe, RN Outcome: Progressing Goal: Goals will be collaboratively established with patient/family 04/15/2023 1534 by Brittany Coe, RN Outcome: Progressing 04/15/2023 0858 by Brittany Coe, RN Outcome: Progressing   Problem: Self-Care: Goal: Ability to participate in self-care as condition permits will improve 04/15/2023 1534 by Brittany Coe, RN Outcome: Progressing 04/15/2023 0858 by Brittany Coe, RN Outcome: Progressing Goal: Verbalization of feelings and concerns over difficulty with self-care will  improve 04/15/2023 1534 by Brittany Coe, RN Outcome: Progressing 04/15/2023 0858 by Brittany Coe, RN Outcome: Progressing Goal: Ability to communicate needs accurately will improve 04/15/2023 1534 by Brittany Coe, RN Outcome: Progressing 04/15/2023 0858 by Brittany Coe, RN Outcome: Progressing   Problem: Nutrition: Goal: Risk of aspiration will decrease 04/15/2023 1534 by Brittany Coe, RN Outcome: Progressing 04/15/2023 0858 by Brittany Coe, RN Outcome: Progressing Goal: Dietary intake will improve 04/15/2023 1534 by Brittany Coe, RN Outcome: Progressing 04/15/2023 0858 by Brittany Coe, RN Outcome: Progressing   Problem: Education: Goal: Knowledge of General Education information will improve Description: Including pain rating scale, medication(s)/side effects and non-pharmacologic comfort measures 04/15/2023 1534 by Brittany Coe, RN Outcome: Progressing 04/15/2023 0858 by Brittany Coe, RN Outcome: Progressing   Problem: Health Behavior/Discharge Planning: Goal: Ability to manage health-related needs will improve 04/15/2023 1534 by Brittany Coe, RN Outcome: Progressing 04/15/2023 0858 by Brittany Coe, RN Outcome: Progressing   Problem: Clinical Measurements: Goal: Ability to maintain clinical measurements within normal limits will improve 04/15/2023 1534 by Brittany Coe, RN Outcome: Progressing 04/15/2023 0858 by Brittany Coe, RN Outcome: Progressing Goal: Will remain free from infection 04/15/2023 1534 by Brittany Coe, RN Outcome: Progressing 04/15/2023 0858 by Brittany Coe, RN Outcome: Progressing Goal: Diagnostic test results will improve 04/15/2023 1534 by Brittany Coe, RN Outcome: Progressing 04/15/2023 0858 by Brittany Coe, RN Outcome: Progressing Goal: Respiratory complications will improve 04/15/2023 1534 by Brittany Coe, RN Outcome: Progressing 04/15/2023 0858 by Brittany Coe, RN Outcome: Progressing Goal:  Cardiovascular complication will be avoided 04/15/2023 1534 by Brittany Coe, RN Outcome: Progressing 04/15/2023 0858 by Brittany Coe, RN Outcome: Progressing   Problem: Activity: Goal: Risk for activity intolerance will decrease 04/15/2023 1534 by Brittany Coe, RN Outcome: Progressing 04/15/2023 0858 by  Brittany Coe, RN Outcome: Progressing   Problem: Nutrition: Goal: Adequate nutrition will be maintained 04/15/2023 1534 by Brittany Coe, RN Outcome: Progressing 04/15/2023 0858 by Brittany Coe, RN Outcome: Progressing   Problem: Coping: Goal: Level of anxiety will decrease 04/15/2023 1534 by Brittany Coe, RN Outcome: Progressing 04/15/2023 0858 by Brittany Coe, RN Outcome: Progressing   Problem: Elimination: Goal: Will not experience complications related to bowel motility 04/15/2023 1534 by Brittany Coe, RN Outcome: Progressing 04/15/2023 0858 by Brittany Coe, RN Outcome: Progressing Goal: Will not experience complications related to urinary retention 04/15/2023 1534 by Brittany Coe, RN Outcome: Progressing 04/15/2023 0858 by Brittany Coe, RN Outcome: Progressing   Problem: Pain Managment: Goal: General experience of comfort will improve and/or be controlled 04/15/2023 1534 by Brittany Coe, RN Outcome: Progressing 04/15/2023 0858 by Brittany Coe, RN Outcome: Progressing   Problem: Safety: Goal: Ability to remain free from injury will improve 04/15/2023 1534 by Brittany Coe, RN Outcome: Progressing 04/15/2023 0858 by Brittany Coe, RN Outcome: Progressing   Problem: Skin Integrity: Goal: Risk for impaired skin integrity will decrease 04/15/2023 1534 by Brittany Coe, RN Outcome: Progressing 04/15/2023 0858 by Brittany Coe, RN Outcome: Progressing

## 2023-04-15 NOTE — Progress Notes (Signed)
STROKE TEAM PROGRESS NOTE   BRIEF HPI [name] is a 58 y.o. 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 CT head revealed left MCA subacute infarct. CTA revealed short segment occlusion or near occlusive stenosis in left M1, with possible reconstitution in the proximal left M2.    SIGNIFICANT HOSPITAL EVENTS 1/24 admitted with left MCA subacute infarct  INTERIM HISTORY/SUBJECTIVE No acute changes over night. Improving expressive aphasia. She is able to follow commands, repeat, and name every object requested.     OBJECTIVE  CBC    Component Value Date/Time   WBC 5.7 04/13/2023 0642   RBC 4.50 04/13/2023 0642   HGB 13.3 04/13/2023 0642   HGB 15.1 12/01/2021 1410   HCT 39.3 04/13/2023 0642   HCT 46.1 12/01/2021 1410   PLT 158 04/13/2023 0642   PLT 164 12/01/2021 1410   MCV 87.3 04/13/2023 0642   MCV 87 12/01/2021 1410   MCH 29.6 04/13/2023 0642   MCHC 33.8 04/13/2023 0642   RDW 15.0 04/13/2023 0642   RDW 14.4 12/01/2021 1410   LYMPHSABS 2.4 04/12/2023 1255   LYMPHSABS 2.4 02/04/2019 1450   MONOABS 0.5 04/12/2023 1255   EOSABS 0.1 04/12/2023 1255   EOSABS 0.1 02/04/2019 1450   BASOSABS 0.0 04/12/2023 1255   BASOSABS 0.0 02/04/2019 1450    BMET    Component Value Date/Time   NA 140 04/13/2023 0642   NA 147 (H) 12/01/2021 1410   K 3.8 04/13/2023 0642   CL 109 04/13/2023 0642   CO2 23 04/13/2023 0642   GLUCOSE 98 04/13/2023 0642   BUN 9 04/13/2023 0642   BUN 8 12/01/2021 1410   CREATININE 1.00 04/13/2023 0642   CALCIUM 8.9 04/13/2023 0642   EGFR 71 12/01/2021 1410   GFRNONAA >60 04/13/2023 0642    IMAGING past 24 hours No results found.  Vitals:   04/15/23 0830 04/15/23 1238 04/15/23 1312 04/15/23 1600  BP: (!) 147/74 (!) 185/99 (!) 146/135 93/81  Pulse: (!) 108 (!) 122 100 (!) 101   Resp: 16 16  18   Temp: 98.9 F (37.2 C) 98.7 F (37.1 C)  98.5 F (36.9 C)  TempSrc: Oral Oral  Oral  SpO2: 99% 94%  97%  Weight:      Height:         PHYSICAL EXAM General:  Alert, well-nourished, well-developed patient in no acute distress Psych:  Mood and affect appropriate for situation CV: Regular rate and rhythm on monitor Respiratory:  Regular, unlabored respirations on room air GI: Abdomen soft and nontender   NEURO:  Mental Status: oriented to self, place and month, improving Expressive aphasia, can name items, repeat, and follow commands.   Cranial Nerves:  II: PERRL. Visual fields full.  III, IV, VI: EOMI. Eyelids elevate symmetrically.  V: Sensation is intact to light touch and symmetrical to face.  VII: Face is symmetrical resting and smiling VIII: hearing intact to voice. IX, X: Palate elevates symmetrically. Phonation is normal.  ZO:[id number] shrug 5/5. XII: tongue is midline without fasciculations. Motor: 4-/5 in RUE, otherwise 4/5 strength to all other muscle groups tested.  Tone: is normal and bulk is normal Sensation- Intact to light touch bilaterally. Extinction absent to light touch to DSS.   Coordination: FTN intact bilaterally, HKS: no ataxia in BLE.No drift.  Gait- deferred   ASSESSMENT/PLAN  Acute Ischemic Infarct:  left MCA territory ischemic stroke with expressive aphasia and  RUE weakness Etiology:  cardioembolic vs large vessel atherosclerotic disease, will need ziopatch on discharge for 1 month.  CTA head & neck  short segment occlusion or near occlusive stenosis in left M1, with possible reconstitution in the proximal left M2 MRI scattered acute/subacute ischemic infarcts in left cerebral hemisphere.  Few scattered late subacute to chronic MCA infarcts 2D Echo EF 60 to 65%.  LV with severe hypertrophy and grade 1 diastolic dysfunction LDL 124 HgbA1c 5.9 VTE prophylaxis -Lovenox No antithrombotic prior to admission, now on aspirin 81 mg  daily and clopidogrel 75 mg daily for 3 weeks and then aspirin alone. 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 No TOC consult for cessation placed  Dysphagia Patient has post-stroke dysphagia, SLP consulted    Diet   Diet Heart Room service appropriate? Yes; Fluid consistency: Thin   Advance diet as tolerated  Other Stroke Risk Factors No Family hx stroke No Coronary artery disease No Congestive heart failure No Obstructive sleep apnea, No Migraines   Other Active Problems GERD AKI  Hospital day # 2  Stroke neurology will sign off at this time.  Anibal Henderson, MD [facility]  To contact Stroke Continuity provider, please refer to [email or web address]. After hours, contact General Neurology

## 2023-04-15 NOTE — Plan of Care (Signed)

## 2023-04-15 NOTE — Progress Notes (Signed)
[facility] night cross cover note:   I was notified by RN of the patient's complaint of back discomfort, with associated request for pain medication.  I subsequently ordered prn Percocet for her as well as prn Zofran.     Newton Pigg, DO Hospitalist

## 2023-04-16 ENCOUNTER — Inpatient Hospital Stay (HOSPITAL_COMMUNITY): Payer: Medicaid Other

## 2023-04-16 ENCOUNTER — Encounter (HOSPITAL_COMMUNITY)
Admission: EM | Disposition: A | Discharge status: Rehabilitation Facility | Payer: Self-pay | Source: Home / Self Care | Attending: Internal Medicine

## 2023-04-16 DIAGNOSIS — I6602 Occlusion and stenosis of left middle cerebral artery: Secondary | ICD-10-CM | POA: Diagnosis present

## 2023-04-16 DIAGNOSIS — I10 Essential (primary) hypertension: Secondary | ICD-10-CM | POA: Diagnosis not present

## 2023-04-16 DIAGNOSIS — I63511 Cerebral infarction due to unspecified occlusion or stenosis of right middle cerebral artery: Secondary | ICD-10-CM

## 2023-04-16 DIAGNOSIS — I639 Cerebral infarction, unspecified: Secondary | ICD-10-CM | POA: Diagnosis not present

## 2023-04-16 HISTORY — PX: IR CT HEAD LTD: IMG2386

## 2023-04-16 HISTORY — PX: RADIOLOGY WITH ANESTHESIA: SHX6223

## 2023-04-16 HISTORY — PX: IR INTRA CRAN STENT: IMG2345

## 2023-04-16 HISTORY — PX: IR PERCUTANEOUS ART THROMBECTOMY/INFUSION INTRACRANIAL INC DIAG ANGIO: IMG6087

## 2023-04-16 LAB — BASIC METABOLIC PANEL
Anion gap: 9 (ref 5–15)
BUN: 24 mg/dL — ABNORMAL HIGH (ref 6–20)
CO2: 19 mmol/L — ABNORMAL LOW (ref 22–32)
Calcium: 8.2 mg/dL — ABNORMAL LOW (ref 8.9–10.3)
Chloride: 110 mmol/L (ref 98–111)
Creatinine, Ser: 1.59 mg/dL — ABNORMAL HIGH (ref 0.44–1.00)
GFR, Estimated: 38 mL/min — ABNORMAL LOW (ref >60)
Glucose, Bld: 126 mg/dL — ABNORMAL HIGH (ref 70–99)
Potassium: 3.6 mmol/L (ref 3.5–5.1)
Sodium: 138 mmol/L (ref 135–145)

## 2023-04-16 LAB — CBC
HCT: 39.6 % (ref 36.0–46.0)
Hemoglobin: 13.6 g/dL (ref 12.0–15.0)
MCH: 29.8 pg (ref 26.0–34.0)
MCHC: 34.3 g/dL (ref 30.0–36.0)
MCV: 86.8 fL (ref 80.0–100.0)
Platelets: 179 10*3/uL (ref 150–400)
RBC: 4.56 MIL/uL (ref 3.87–5.11)
RDW: 14.9 % (ref 11.5–15.5)
WBC: 7.9 10*3/uL (ref 4.0–10.5)
nRBC: 0 % (ref 0.0–0.2)

## 2023-04-16 LAB — POCT I-STAT 7, (LYTES, BLD GAS, ICA,H+H)
Acid-base deficit: 6 mmol/L — ABNORMAL HIGH (ref 0.0–2.0)
Bicarbonate: 20.2 mmol/L (ref 20.0–28.0)
Calcium, Ion: 1.15 mmol/L (ref 1.15–1.40)
HCT: 42 % (ref 36.0–46.0)
Hemoglobin: 14.3 g/dL (ref 12.0–15.0)
O2 Saturation: 100 %
Patient temperature: 98.7
Potassium: 3.4 mmol/L — ABNORMAL LOW (ref 3.5–5.1)
Sodium: 141 mmol/L (ref 135–145)
TCO2: 21 mmol/L — ABNORMAL LOW (ref 22–32)
pCO2 arterial: 42.1 mm[Hg] (ref 32–48)
pH, Arterial: 7.29 — ABNORMAL LOW (ref 7.35–7.45)
pO2, Arterial: 414 mm[Hg] — ABNORMAL HIGH (ref 83–108)

## 2023-04-16 LAB — SARS CORONAVIRUS 2 BY RT PCR: SARS Coronavirus 2 by RT PCR: NEGATIVE

## 2023-04-16 LAB — MRSA NEXT GEN BY PCR, NASAL: MRSA by PCR Next Gen: NOT DETECTED

## 2023-04-16 LAB — GLUCOSE, CAPILLARY
Glucose-Capillary: 123 mg/dL — ABNORMAL HIGH (ref 70–99)
Glucose-Capillary: 132 mg/dL — ABNORMAL HIGH (ref 70–99)

## 2023-04-16 SURGERY — IR WITH ANESTHESIA
Anesthesia: General

## 2023-04-16 MED ORDER — ASPIRIN 325 MG PO TABS
ORAL_TABLET | ORAL | Status: DC | PRN
  Administered 2023-04-16: 81 mg

## 2023-04-16 MED ORDER — TICAGRELOR 90 MG PO TABS
90.0000 mg | ORAL_TABLET | Freq: Two times a day (BID) | ORAL | Status: DC
  Administered 2023-04-16 – 2023-04-17 (×2): 90 mg
  Filled 2023-04-16 (×4): qty 1

## 2023-04-16 MED ORDER — ASPIRIN 81 MG PO CHEW
81.0000 mg | CHEWABLE_TABLET | Freq: Every day | ORAL | Status: DC
Start: 2023-04-17 — End: 2023-04-16

## 2023-04-16 MED ORDER — GABAPENTIN 300 MG PO CAPS
300.0000 mg | ORAL_CAPSULE | Freq: Three times a day (TID) | ORAL | Status: DC
  Administered 2023-04-16 – 2023-04-17 (×5): 300 mg
  Filled 2023-04-16 (×5): qty 1

## 2023-04-16 MED ORDER — IOHEXOL 350 MG/ML SOLN
75.0000 mL | Freq: Once | INTRAVENOUS | Status: AC | PRN
  Administered 2023-04-16: 75 mL via INTRAVENOUS

## 2023-04-16 MED ORDER — SODIUM CHLORIDE (PF) 0.9 % IJ SOLN
INTRAVENOUS | Status: DC | PRN
  Administered 2023-04-16 (×3): 25 ug via INTRA_ARTERIAL

## 2023-04-16 MED ORDER — CHLORHEXIDINE GLUCONATE CLOTH 2 % EX PADS
6.0000 | MEDICATED_PAD | Freq: Every day | CUTANEOUS | Status: DC
  Administered 2023-04-16 – 2023-04-20 (×5): 6 via TOPICAL

## 2023-04-16 MED ORDER — PROPOFOL 10 MG/ML IV BOLUS
INTRAVENOUS | Status: DC | PRN
  Administered 2023-04-16: 150 mg via INTRAVENOUS
  Administered 2023-04-16: 50 mg via INTRAVENOUS
  Administered 2023-04-16: 20 mg via INTRAVENOUS
  Administered 2023-04-16: 30 mg via INTRAVENOUS

## 2023-04-16 MED ORDER — PROPOFOL 1000 MG/100ML IV EMUL
INTRAVENOUS | Status: AC
  Administered 2023-04-16: 50 ug/kg/min via INTRAVENOUS
  Filled 2023-04-16: qty 100

## 2023-04-16 MED ORDER — SODIUM CHLORIDE 0.9 % IV SOLN: INTRAVENOUS | Status: DC | PRN

## 2023-04-16 MED ORDER — TICAGRELOR 60 MG PO TABS
ORAL_TABLET | ORAL | Status: AC | PRN
  Administered 2023-04-16: 90 mg

## 2023-04-16 MED ORDER — IOHEXOL 300 MG/ML  SOLN
150.0000 mL | Freq: Once | INTRAMUSCULAR | Status: AC | PRN
  Administered 2023-04-16: 80 mL via INTRA_ARTERIAL

## 2023-04-16 MED ORDER — ACETAMINOPHEN 325 MG PO TABS
650.0000 mg | ORAL_TABLET | Freq: Four times a day (QID) | ORAL | Status: DC | PRN
  Administered 2023-04-17: 650 mg
  Filled 2023-04-16: qty 2

## 2023-04-16 MED ORDER — PROPOFOL 1000 MG/100ML IV EMUL
0.0000 ug/kg/min | INTRAVENOUS | Status: DC
  Administered 2023-04-16: 30 ug/kg/min via INTRAVENOUS
  Administered 2023-04-16: 50 ug/kg/min via INTRAVENOUS
  Administered 2023-04-17: 40 ug/kg/min via INTRAVENOUS
  Filled 2023-04-16 (×2): qty 100

## 2023-04-16 MED ORDER — HYDRALAZINE HCL 50 MG PO TABS
50.0000 mg | ORAL_TABLET | Freq: Three times a day (TID) | ORAL | Status: DC
  Administered 2023-04-16 – 2023-04-18 (×5): 50 mg
  Filled 2023-04-16 (×5): qty 1

## 2023-04-16 MED ORDER — POLYETHYLENE GLYCOL 3350 17 G PO PACK
17.0000 g | PACK | Freq: Every day | ORAL | Status: DC
  Administered 2023-04-16 – 2023-04-17 (×2): 17 g
  Filled 2023-04-16 (×2): qty 1

## 2023-04-16 MED ORDER — FENTANYL CITRATE PF 50 MCG/ML IJ SOSY
50.0000 ug | PREFILLED_SYRINGE | INTRAMUSCULAR | Status: DC | PRN
  Administered 2023-04-17: 50 ug via INTRAVENOUS
  Filled 2023-04-16 (×2): qty 1

## 2023-04-16 MED ORDER — STERILE WATER FOR INJECTION IJ SOLN
INTRAMUSCULAR | Status: AC
  Filled 2023-04-16: qty 10

## 2023-04-16 MED ORDER — FENTANYL CITRATE PF 50 MCG/ML IJ SOSY
50.0000 ug | PREFILLED_SYRINGE | INTRAMUSCULAR | Status: DC | PRN
Start: 2023-04-16 — End: 2023-04-17
  Administered 2023-04-16: 100 ug via INTRAVENOUS
  Administered 2023-04-16 (×2): 50 ug via INTRAVENOUS
  Administered 2023-04-16 – 2023-04-17 (×3): 100 ug via INTRAVENOUS
  Filled 2023-04-16 (×4): qty 2

## 2023-04-16 MED ORDER — PANTOPRAZOLE SODIUM 40 MG IV SOLR
40.0000 mg | INTRAVENOUS | Status: DC
  Administered 2023-04-16 – 2023-04-17 (×2): 40 mg via INTRAVENOUS
  Filled 2023-04-16 (×2): qty 10

## 2023-04-16 MED ORDER — PROPOFOL 500 MG/50ML IV EMUL
INTRAVENOUS | Status: DC | PRN
  Administered 2023-04-16: 75 ug/kg/min via INTRAVENOUS

## 2023-04-16 MED ORDER — POTASSIUM CHLORIDE 20 MEQ PO PACK
40.0000 meq | PACK | Freq: Once | ORAL | Status: AC
  Administered 2023-04-16: 40 meq
  Filled 2023-04-16: qty 2

## 2023-04-16 MED ORDER — CLEVIDIPINE BUTYRATE 0.5 MG/ML IV EMUL
0.0000 mg/h | INTRAVENOUS | Status: DC
  Filled 2023-04-16: qty 50

## 2023-04-16 MED ORDER — TICAGRELOR 90 MG PO TABS
ORAL_TABLET | ORAL | Status: AC
  Filled 2023-04-16: qty 1

## 2023-04-16 MED ORDER — VITAMIN D 25 MCG (1000 UNIT) PO TABS
1000.0000 [IU] | ORAL_TABLET | Freq: Every day | ORAL | Status: DC
Start: 2023-04-16 — End: 2023-04-20
  Administered 2023-04-17 – 2023-04-20 (×4): 1000 [IU] via ORAL
  Filled 2023-04-16 (×4): qty 1

## 2023-04-16 MED ORDER — NITROGLYCERIN 1 MG/10 ML FOR IR/CATH LAB
INTRA_ARTERIAL | Status: AC
  Filled 2023-04-16: qty 10

## 2023-04-16 MED ORDER — SUCCINYLCHOLINE CHLORIDE 200 MG/10ML IV SOSY
PREFILLED_SYRINGE | INTRAVENOUS | Status: DC | PRN
  Administered 2023-04-16: 200 mg via INTRAVENOUS

## 2023-04-16 MED ORDER — FENTANYL CITRATE PF 50 MCG/ML IJ SOSY
PREFILLED_SYRINGE | INTRAMUSCULAR | Status: AC
  Filled 2023-04-16: qty 1

## 2023-04-16 MED ORDER — TICAGRELOR 90 MG PO TABS
90.0000 mg | ORAL_TABLET | Freq: Two times a day (BID) | ORAL | Status: DC
  Administered 2023-04-17 – 2023-04-20 (×6): 90 mg via ORAL
  Filled 2023-04-16 (×6): qty 1

## 2023-04-16 MED ORDER — AMLODIPINE BESYLATE 10 MG PO TABS
10.0000 mg | ORAL_TABLET | Freq: Every day | ORAL | Status: DC
  Administered 2023-04-17: 10 mg
  Filled 2023-04-16: qty 1

## 2023-04-16 MED ORDER — INSULIN ASPART 100 UNIT/ML IJ SOLN: 0.0000 [IU] | INTRAMUSCULAR | Status: DC

## 2023-04-16 MED ORDER — SENNA 8.6 MG PO TABS
1.0000 | ORAL_TABLET | Freq: Two times a day (BID) | ORAL | Status: DC
  Administered 2023-04-16 – 2023-04-17 (×3): 8.6 mg
  Filled 2023-04-16 (×3): qty 1

## 2023-04-16 MED ORDER — IOHEXOL 300 MG/ML  SOLN
150.0000 mL | Freq: Once | INTRAMUSCULAR | Status: AC | PRN
  Administered 2023-04-16: 20 mL via INTRA_ARTERIAL

## 2023-04-16 MED ORDER — ASPIRIN 81 MG PO CHEW
81.0000 mg | CHEWABLE_TABLET | Freq: Every day | ORAL | Status: DC
  Administered 2023-04-18 – 2023-04-20 (×3): 81 mg via ORAL
  Filled 2023-04-16 (×3): qty 1

## 2023-04-16 MED ORDER — PHENYLEPHRINE 80 MCG/ML (10ML) SYRINGE FOR IV PUSH (FOR BLOOD PRESSURE SUPPORT)
PREFILLED_SYRINGE | INTRAVENOUS | Status: DC | PRN
  Administered 2023-04-16 (×7): 160 ug via INTRAVENOUS

## 2023-04-16 MED ORDER — LABETALOL HCL 5 MG/ML IV SOLN
10.0000 mg | INTRAVENOUS | Status: DC | PRN
  Administered 2023-04-16 – 2023-04-18 (×3): 10 mg via INTRAVENOUS
  Filled 2023-04-16 (×3): qty 4

## 2023-04-16 MED ORDER — SODIUM CHLORIDE 0.9 % IV SOLN: INTRAVENOUS | Status: AC

## 2023-04-16 MED ORDER — LIDOCAINE 2% (20 MG/ML) 5 ML SYRINGE
INTRAMUSCULAR | Status: DC | PRN
  Administered 2023-04-16: 60 mg via INTRAVENOUS

## 2023-04-16 MED ORDER — DOCUSATE SODIUM 50 MG/5ML PO LIQD
100.0000 mg | Freq: Two times a day (BID) | ORAL | Status: DC
  Administered 2023-04-16: 100 mg
  Filled 2023-04-16: qty 10

## 2023-04-16 MED ORDER — ASPIRIN 81 MG PO CHEW
CHEWABLE_TABLET | ORAL | Status: AC
  Filled 2023-04-16: qty 1

## 2023-04-16 MED ORDER — SODIUM CHLORIDE 0.9 % IV SOLN
INTRAVENOUS | Status: AC | PRN
  Administered 2023-04-16: 2 ug/kg/min via INTRAVENOUS

## 2023-04-16 MED ORDER — ATORVASTATIN CALCIUM 80 MG PO TABS
80.0000 mg | ORAL_TABLET | Freq: Every day | ORAL | Status: DC
  Administered 2023-04-17: 80 mg
  Filled 2023-04-16: qty 1

## 2023-04-16 MED ORDER — ASPIRIN 81 MG PO CHEW
81.0000 mg | CHEWABLE_TABLET | Freq: Every day | ORAL | Status: DC
  Administered 2023-04-17: 81 mg
  Filled 2023-04-16 (×3): qty 1

## 2023-04-16 MED ORDER — CANGRELOR TETRASODIUM 50 MG IV SOLR
INTRAVENOUS | Status: AC
  Filled 2023-04-16: qty 50

## 2023-04-16 MED ORDER — CANGRELOR BOLUS VIA INFUSION
INTRAVENOUS | Status: AC | PRN
  Administered 2023-04-16: 1095 ug via INTRAVENOUS

## 2023-04-16 MED ORDER — CLEVIDIPINE BUTYRATE 0.5 MG/ML IV EMUL
INTRAVENOUS | Status: AC
  Administered 2023-04-16: 20 mg via INTRAVENOUS
  Filled 2023-04-16: qty 100

## 2023-04-16 MED ORDER — CLEVIDIPINE BUTYRATE 0.5 MG/ML IV EMUL
0.0000 mg/h | INTRAVENOUS | Status: DC
  Administered 2023-04-16: 14.5 mg/h via INTRAVENOUS
  Administered 2023-04-17: 6 mg/h via INTRAVENOUS
  Filled 2023-04-16: qty 100

## 2023-04-16 MED ORDER — ROCURONIUM BROMIDE 10 MG/ML (PF) SYRINGE
PREFILLED_SYRINGE | INTRAVENOUS | Status: DC | PRN
  Administered 2023-04-16: 40 mg via INTRAVENOUS
  Administered 2023-04-16: 50 mg via INTRAVENOUS
  Administered 2023-04-16: 10 mg via INTRAVENOUS

## 2023-04-16 MED ORDER — ORAL CARE MOUTH RINSE
15.0000 mL | OROMUCOSAL | Status: DC | PRN
  Administered 2023-04-16: 15 mL via OROMUCOSAL

## 2023-04-16 MED ORDER — ORAL CARE MOUTH RINSE
15.0000 mL | OROMUCOSAL | Status: DC
Start: 2023-04-17 — End: 2023-04-17
  Administered 2023-04-16 – 2023-04-17 (×6): 15 mL via OROMUCOSAL

## 2023-04-16 MED ORDER — PHENYLEPHRINE HCL-NACL 20-0.9 MG/250ML-% IV SOLN
INTRAVENOUS | Status: DC | PRN
  Administered 2023-04-16: 40 ug/min via INTRAVENOUS
  Administered 2023-04-16: 60 ug/min via INTRAVENOUS

## 2023-04-16 NOTE — Consult Note (Signed)
NAME:  Brittany [name], MRN:  [id number], DOB:  September 12, 1965, LOS: 3 ADMISSION DATE:  04/12/2023, CONSULTATION DATE:  04/16/23 REFERRING MD:  deveshwar, CHIEF COMPLAINT:  left MCA stroke s/p angioplasty and stent   History of Present Illness:  The patient is a 58 year old female who presented to [facility] 04/12/23 with complaints of cognitive decline and slurred speech. 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 At baseline she walks with a cane and is able to live alone and complete ADLs independently. Over the last 2 weeks her mother has noticed slurred speech, confusion, and an inability to find words or identify objects. These symptoms had gotten worse over the last 2 days before admission.  [place] showed subacute strokes in the left MCA territory with possible short segment occlusion and distal MCA branches. She was then transferred to [facility]. ED labs were remarkable for potassium 3.3, Creatinine 1.11, THC positive. She was hemodynamically stable and alert at that time. MRI showed scattered acute to early subacute ischemic infarcts involving the left cerebral hemisphere. She was outside the window for invasive intervention or lytics.   Since admission she has been started on aspirin, plavix, and lipitor. Her aphasia was improving. Today (1/27) she had sudden onset confusion with right sided weakness, facial droop, and left sided gaze. Code stroke was activated. The patient was taken to IR found to have complete MCA occlusion. Underwent angioplasty and left MCA stent successfully placed. Critical care was consulted for post procedure care.   Pertinent  Medical History  Anemia, Arthritis, GERD, HTN, Stroke in 2017 with minimal deficits. Recent scattered left hemisphere strokes (this admission, 1/23)  Significant Hospital Events: Including procedures, antibiotic start and stop dates in addition to other pertinent events   1/23 admitted to  medicine for subacute stroke. CT/MRI with subacute left hemisphere strokes. Out of window for intervention 1/27 code stroke with MCA stent placed by IR and ccm consulted   Interim History / Subjective:  Intubated post procedure. Not arousable  Objective   Blood pressure 132/77, pulse (!) 118, temperature 98.7 F (37.1 C), temperature source Oral, resp. rate 18, height 5\' 2"  (1.575 m), weight 73 kg, SpO2 100%.        Intake/Output Summary (Last 24 hours) at 04/16/2023 1510 Last data filed at 04/16/2023 1502 Gross per 24 hour  Intake 500 ml  Output 600 ml  Net -100 ml   Filed Weights   04/12/23 2200  Weight: 73 kg    Examination: General: well nourished, unresponsive on propofol infusion HENT: ET tube present, pinpoint pupils Lungs: clear bilaterally, currently on full vent support. Pcxr ~ 2cm above carina. No infiltrates  Cardiovascular: RRR Abdomen: soft Extremities: no edema, pulses palpable. Right femoral site soft without hematoma Neuro: unable to assess, unresponsive on sedation. Pupils pinpoint    Resolved Hospital Problem list   AKI Abdominal pain/constipation Hypokalemia   Assessment & Plan:  Acute Left MCA stroke s/p MCA stent 1/27 superimposed on subacute scattered left hemisphere strokes left MCA stenosis Presented to the ED with subacute left hemisphere strokes manifesting aphasia and confusion. Subsequently had acute stroke with right sided weakness, facial droop, and left gaze.  Plan cont cangrelor per neuro IR (completes at 1815) control BP, goal 120-140 (see HTN below) cont asa, lipitor, plavix Keep blood sugar 140-180 avoid fever F/u CT imaging   Post operative ventilator management Plan keep intubated at least until repeat head CT  ABG cont SpO2 monitoring wean FiO2 as able PAD protocol RASS goal -2 VAP bundle  Hypertension On losartan, amlodipine, hydrochlorothiazide, and hydralazine at home. SBP 200s post procedure Plan  Goal 120-140  SBP cleviprex PRN labetalol restart home losartan, amlodipine, and hydralazine. Hold hydrochlorothiazide for now  Recent AKI. Had improved Plan F/u chem after angioplasty dye load Renal dose meds Strict I&O BP control Am chem as well  Hyperlipidemia Plan cont Lipitor per tube  GERD Plan cont protonix   Best Practice (right click and "Reselect all SmartList Selections" daily)   Diet/type: NPO DVT prophylaxis SCD Pressure ulcer(s): N/A GI prophylaxis: PPI Lines: Arterial Line and yes and it is still needed Foley:  Yes, and it is still needed Code Status:  full code Last date of multidisciplinary goals of care discussion []   Labs   CBC: Recent Labs  Lab 04/12/23 1255 04/13/23 0642  WBC 6.7 5.7  NEUTROABS 3.7  --   HGB 15.0 13.3  HCT 45.2 39.3  MCV 87.9 87.3  PLT 176 158    Basic Metabolic Panel: Recent Labs  Lab 04/12/23 1255 04/13/23 0642  NA 135 140  K 3.3* 3.8  CL 101 109  CO2 20* 23  GLUCOSE 161* 98  BUN 12 9  CREATININE 1.11* 1.00  CALCIUM 9.3 8.9   GFR: Estimated Creatinine Clearance: 58.1 mL/min (by C-G formula based on SCr of 1 mg/dL). Recent Labs  Lab 04/12/23 1255 04/13/23 0642  WBC 6.7 5.7    Liver Function Tests: Recent Labs  Lab 04/12/23 1255  AST 25  ALT 15  ALKPHOS 123  BILITOT 0.5  PROT 8.2*  ALBUMIN 4.3   Recent Labs  Lab 04/12/23 1255  LIPASE 78*   Recent Labs  Lab 04/12/23 1255  AMMONIA 27    ABG No results found for: "PHART", "PCO2ART", "PO2ART", "HCO3", "TCO2", "ACIDBASEDEF", "O2SAT"   Coagulation Profile: Recent Labs  Lab 04/12/23 1255  INR 1.0    Cardiac Enzymes: No results for input(s): "CKTOTAL", "CKMB", "CKMBINDEX", "TROPONINI" in the last 168 hours.  HbA1C: HbA1c, POC (prediabetic range)  Date/Time Value Ref Range Status  01/06/2021 02:42 PM 5.9 5.7 - 6.4 % Final   HbA1c, POC (controlled diabetic range)  Date/Time Value Ref Range Status  01/06/2021 02:42 PM 5.9 0.0 - 7.0 % Final    HbA1c POC (<> result, manual entry)  Date/Time Value Ref Range Status  01/06/2021 02:42 PM 5.9 4.0 - 5.6 % Final   Hgb A1c MFr Bld  Date/Time Value Ref Range Status  04/13/2023 06:42 AM 5.9 (H) 4.8 - 5.6 % Final    Comment:    (NOTE) Pre diabetes:          5.7%-6.4%  Diabetes:              >6.4%  Glycemic control for   <7.0% adults with diabetes   12/06/2022 01:54 AM 5.3 4.8 - 5.6 % Final    Comment:    (NOTE) Pre diabetes:          5.7%-6.4%  Diabetes:              >6.4%  Glycemic control for   <7.0% adults with diabetes     CBG: Recent Labs  Lab 04/12/23 1255 04/16/23 1013  GLUCAP 166* 123*    Review of Systems:   Unable to assess, unresponsive   Past Medical History:  She,  has a past medical history of Anemia, Arthritis, Complication of anesthesia, Elevated cholesterol, Female bladder  prolapse, GERD (gastroesophageal reflux disease), Heart murmur, Hiatal hernia, Hypertension, IBS (irritable bowel syndrome), Multiple myeloma (HCC) (2005), Peripheral neuropathy, SOB (shortness of breath) on exertion, and Stroke (HCC) (2017).   Surgical History:   Past Surgical History:  Procedure Laterality Date   BONE BIOPSY  2005   in her back   COLONOSCOPY     over 10 years x3   ESOPHAGOGASTRODUODENOSCOPY     incomplete-over 10 years ago    TEE WITHOUT CARDIOVERSION N/A 12/08/2016   Procedure: TRANSESOPHAGEAL ECHOCARDIOGRAM (TEE);  Surgeon: Quintella Reichert, MD;  Location: [facility] ENDOSCOPY;  Service: Cardiovascular;  Laterality: N/A;   TUBAL LIGATION     UPPER GASTROINTESTINAL ENDOSCOPY     WISDOM TOOTH EXTRACTION       Social History:   reports that she quit smoking about 7 years ago. Her smoking use included cigarettes. She has never used smokeless tobacco. She reports that she does not currently use alcohol. She reports that she does not currently use drugs after having used the following drugs: Marijuana.   Family History:  Her family history includes Congestive  Heart Failure in her father; Diverticulitis in her mother; Hypertension in her father and mother; Irritable bowel syndrome in her mother; Liver cancer in her mother; Sarcoidosis in her mother; Scoliosis in her brother; Stomach cancer in her father; Stroke in her maternal uncle. There is no history of Colon cancer, Esophageal cancer, Colon polyps, or Rectal cancer.   Allergies Allergies  Allergen Reactions   Amitriptyline Other (See Comments)    Patient reported that it made her throat feel like its locking up and it also caused issues with her going to the bathroom   Duloxetine Other (See Comments)    Patient reported that it made her throat lock up and it caused her to have issue with going to the bathroom   Naproxen     Vomiting, sweating, abd spasms   Other     States can't take pain meds that end in "cet" or meds that end in "ine" Darvocet/severe vomiting   Beef-Derived Drug Products     Patient has IBS prefers no beef   Pork-Derived Products     Patient has IBS prefers no pork   Darvon [Propoxyphene] Nausea And Vomiting   Hydrocodone Nausea And Vomiting   Lactose Intolerance (Gi)     Bloating, gas, abd pain   Latex Itching and Rash   Oxycodone Nausea And Vomiting   Percocet [Oxycodone-Acetaminophen] Nausea And Vomiting   Topamax [Topiramate]     Memory made her emotional      Home Medications  Prior to Admission medications   Medication Sig Start Date End Date Taking? Authorizing Provider  albuterol (VENTOLIN HFA) 108 (90 Base) MCG/ACT inhaler Inhale 2 puffs into the lungs every 4 (four) hours as needed for wheezing or shortness of breath. 02/24/22  Yes Ivonne Andrew, NP  atorvastatin (LIPITOR) 80 MG tablet Take 1 tablet (80 mg total) by mouth daily. 07/06/22 07/06/23 Yes Clayborne Dana, NP  gabapentin (NEURONTIN) 300 MG capsule Take 1 capsule (300 mg total) by mouth 3 (three) times daily. 07/07/22  Yes Clayborne Dana, NP  hydrALAZINE (APRESOLINE) 50 MG tablet Take 1 tablet  (50 mg total) by mouth every 8 (eight) hours. 12/09/22 04/12/23 Yes Arrien, York Ram, MD  hydrochlorothiazide (HYDRODIURIL) 12.5 MG tablet Take 12.5 mg by mouth daily.   Yes [provider]  linaclotide (LINZESS) 72 MCG capsule Take 1 capsule (72 mcg total) by mouth  daily before breakfast. 07/26/22  Yes Clayborne Dana, NP  losartan (COZAAR) 50 MG tablet Take 1 tablet (50 mg total) by mouth daily. 12/25/22 04/12/23 Yes Clayborne Dana, NP  omeprazole (PRILOSEC) 20 MG capsule Take 1 capsule (20 mg total) by mouth 2 (two) times daily before a meal. 02/24/22 04/12/23 Yes Ivonne Andrew, NP  amLODipine (NORVASC) 10 MG tablet Take 1 tablet (10 mg total) by mouth daily. Patient not taking: Reported on 04/12/2023 12/09/22 01/08/23  Arrien, York Ram, MD  chlorthalidone (HYGROTON) 25 MG tablet Take 1 tablet (25 mg total) by mouth daily. Patient not taking: Reported on 04/12/2023 12/09/22 01/08/23  Arrien, York Ram, MD  triamcinolone cream (KENALOG) 0.1 % APPLY 1 APPLICATION TOPICALLY 2 (TWO) TIMES DAILY. Patient not taking: Reported on 04/12/2023 11/09/21   Ivonne Andrew, NP     Critical care time: 28

## 2023-04-16 NOTE — Progress Notes (Signed)
eLink Physician-Brief Progress Note Patient Name: Brittany [name] DOB: 08/25/1965 MRN: [id number]   Date of Service  04/16/2023  HPI/Events of Note  Patient with left MCA stroke/right hemiparesis status post thrombectomy with stent placement.  Intubated and mechanically ventilated.  Sedation being lowered for hypotension and patient is reaching out with her left upper extremity for medical devices.  RN requests left soft wrist restraint.  eICU Interventions  Chart reviewed.  Video assessment done.  Spoke to bedside RN.  Left wrist restraint ordered.     Intervention Category Minor Interventions: Other:  Carilyn Goodpasture 04/16/2023, 10:42 PM

## 2023-04-16 NOTE — Transfer of Care (Signed)
Immediate Anesthesia Transfer of Care Note  Patient: Brittany [name]  Procedure(s) Performed: IR WITH ANESTHESIA  Patient Location: ICU  Anesthesia Type:General  Level of Consciousness: sedated and unresponsive  Airway & Oxygen Therapy: Patient remains intubated per anesthesia plan and Patient placed on Ventilator (see vital sign flow sheet for setting)  Post-op Assessment: Report given to RN and Post -op Vital signs reviewed and stable  Post vital signs: Reviewed and stable  Last Vitals:  Vitals Value Taken Time  BP 170/110 04/16/23 1532  Temp    Pulse 89 04/16/23 1534  Resp 16 04/16/23 1534  SpO2 100 % 04/16/23 1534  Vitals shown include unfiled device data.  Last Pain:  Vitals:   04/16/23 1032  TempSrc: Oral  PainSc:          Complications: No notable events documented.

## 2023-04-16 NOTE — Anesthesia Postprocedure Evaluation (Signed)
Anesthesia Post Note  Patient: Brittany [name]  Procedure(s) Performed: IR WITH ANESTHESIA     Patient location during evaluation: ICU Anesthesia Type: General Level of consciousness: patient remains intubated per anesthesia plan Pain management: pain level controlled Vital Signs Assessment: post-procedure vital signs reviewed and stable Respiratory status: patient on ventilator - see flowsheet for VS and patient remains intubated per anesthesia plan Cardiovascular status: blood pressure returned to baseline and stable Postop Assessment: no apparent nausea or vomiting Anesthetic complications: no   No notable events documented.  Last Vitals:  Vitals:   04/16/23 1032 04/16/23 1527  BP: 132/77   Pulse: (!) 118 83  Resp:    Temp: 37.1 C   SpO2: 100%     Last Pain:  Vitals:   04/16/23 1032  TempSrc: Oral  PainSc:                  Brittany [name] A.

## 2023-04-16 NOTE — Progress Notes (Signed)
BP>200s, cleviprex gtt started, and maxed out nper order, provider at bedside.

## 2023-04-16 NOTE — Anesthesia Procedure Notes (Signed)
Arterial Line Insertion Start/End1/27/2025 12:15 PM Performed by: Elmer Picker, MD, Samara Deist, CRNA  Patient location: Pre-op. Preanesthetic checklist: patient identified, IV checked, site marked, risks and benefits discussed, surgical consent, monitors and equipment checked, pre-op evaluation, timeout performed and anesthesia consent Lidocaine 1% used for infiltration Left, radial was placed Catheter size: 20 G Hand hygiene performed  and maximum sterile barriers used   Attempts: 2 Procedure performed without using ultrasound guided technique. Following insertion, dressing applied. Post procedure assessment: normal and unchanged  Patient tolerated the procedure well with no immediate complications.

## 2023-04-16 NOTE — Anesthesia Procedure Notes (Signed)
Procedure Name: Intubation Date/Time: 04/16/2023 12:22 PM  Performed by: Georgianne Fick D, CRNAPre-anesthesia Checklist: Patient identified, Emergency Drugs available, Suction available and Patient being monitored Patient Re-evaluated:Patient Re-evaluated prior to induction Oxygen Delivery Method: Circle System Utilized Preoxygenation: Pre-oxygenation with 100% oxygen Induction Type: IV induction Ventilation: Mask ventilation without difficulty Laryngoscope Size: Mac and 4 Grade View: Grade I Tube type: Oral Tube size: 7.0 mm Number of attempts: 1 Airway Equipment and Method: Stylet and Oral airway Placement Confirmation: ETT inserted through vocal cords under direct vision, positive ETCO2 and breath sounds checked- equal and bilateral Secured at: 22 cm Tube secured with: Tape Dental Injury: Teeth and Oropharynx as per pre-operative assessment

## 2023-04-16 NOTE — Progress Notes (Signed)
Art BP 30 points than cuff. Ordered to use ABP rather than cuff for titration.

## 2023-04-16 NOTE — Significant Event (Signed)
Rapid Response Event Note   Reason for Call :  RN called for decreased level of consciousness and right sided weakness.   Initial Focused Assessment:  Patient is laying side ways at the end of the bed. Pt repositioned and NIH obtained. Recent NIH 3, new NIH 11. Patient alert, intermittently following simple commands, aphasic, right sided weakness, right facial droop, no blink to threat on the right side, patient does cross midline, but difficult to get her to look right.   BP- 130/78 HR- 119 RR- 18 O2- 100% TEMP- 98.29F   Interventions:  Attending notified by RN. Neuro then notified by RN and a code stroke was activated.  NIH 11 CBG 121 CT head CTA Head TX to IR for intervention Parents at bedside updated, belongings given to them  Please refer to Code documentation.   Plan of care: Transfer to [facility] ICU post intervention.   Event Summary:   MD Notified: 10:23 Call Time: 10:09 Arrival Time: 10:11 End Time: 12:30  Nechama Guard, RN

## 2023-04-16 NOTE — TOC CM/SW Note (Signed)
Transition of Care [facility]) - Inpatient Brief Assessment   Patient Details  Name: Syndi Pua MRN: [id number] Date of Birth: 08/09/1965  Transition of Care [facility] CM/SW Contact:    Mearl Latin, LCSW Phone Number: 04/16/2023, 6:45 PM   Clinical Narrative: Patient admitted from home alone with in home aide services. Mother provides transportation support. Patient is currently intubated. Will continue to follow.    Transition of Care Asessment: Insurance and Status: Insurance coverage has been reviewed Patient has primary care physician: Yes Home environment has been reviewed: From home Prior level of function:: Independent Prior/Current Home Services: No current home services Social Drivers of Health Review: SDOH reviewed no interventions necessary Readmission risk has been reviewed: Yes Transition of care needs: transition of care needs identified, TOC will continue to follow

## 2023-04-16 NOTE — Plan of Care (Signed)

## 2023-04-16 NOTE — Progress Notes (Addendum)
PT Cancellation Note  Patient Details Name: Brittany [name] MRN: [id number] DOB: 03/12/1966   Cancelled Treatment:    Reason Eval/Treat Not Completed: (P) Medical issues which prohibited therapy, code stroke this AM, pt pending CT scan. Will check back as schedule allows and pt appropriate to continue with PT POC.  Lenora Boys. PTA Acute Rehabilitation Services Office: [phone]     Catalina Antigua 04/16/2023, 11:36 AM

## 2023-04-16 NOTE — Progress Notes (Addendum)
NEUROLOGY CONSULT FOLLOW UP AND CODE STROKE NOTE   Date of service: April 16, 2023 Patient Name: Brittany [name] MRN:  [id number] DOB:  1966/02/08  Interval Hx/subjective   Patient with history of HTN, HLD, left cerebellar stroke, IBS, peripheral neuropathy, multiple myeloma and GERD presented with 1-2 weeks of word finding difficulty and dysarthria and was found to have left MCA territory stroke with severe left MCA stenosis.  Today, she was found to have acute onset right gaze deviation, inability to follow commands and global aphasia.  Code stroke was called, and CT head revealed expected evolution of left MCA territory infarcts.  CT angiogram revealed proximal left M1 occlusion.  There was some delay in proceeding to mechanical thrombectomy due to difficulty in obtaining contact information for son for consent.  LKW: 0800 TNK: No, recent stroke Mechanical thrombectomy: Yes, left M1 occlusion  Premorbod mRS = 2 Vitals   Vitals:   04/16/23 0351 04/16/23 0735 04/16/23 1015 04/16/23 1032  BP: 112/66 115/70 130/78 132/77  Pulse: 100 (!) 101 (!) 119 (!) 118  Resp: 18 17 18    Temp: (!) 97.2 F (36.2 C) 98.1 F (36.7 C) 98.7 F (37.1 C) 98.7 F (37.1 C)  TempSrc: Oral Oral Oral Oral  SpO2: 97% 98%  100%  Weight:      Height:         Body mass index is 29.44 kg/m.  Physical Exam   Constitutional: Appears well-developed and well-nourished.  Psych: Affect appropriate to situation.  Eyes: No scleral injection.  HENT: No OP obstrucion.  Head: Normocephalic.  Respiratory: Effort normal, non-labored breathing.  Skin: WDI.   Neurologic Examination    NEURO:  Mental Status: Patient is alert and able to mimic but cannot follow commands, state name or answer questions Speech/Language: At first, patient was globally aphasic, unable to comprehend speech or have any verbal output.  Later, she was able to say a few short phrases with dysarthric voice but could not meaningfully  converse  Cranial Nerves:  II: PERRL.  Blinks to threat bilaterally III, IV, VI: EOMI. Eyelids elevate symmetrically.  V: Sensation is intact to light touch and symmetrical to face.  VII: Right facial droop VIII: hearing intact to voice. IX, X: Voice is dysarthric XII: tongue is midline without fasciculations. Motor: Able to move all 4 extremities with good antigravity strength Tone: is normal and bulk is normal Sensation- Intact to light touch bilaterally.  Coordination: Unable to perform DTRs: 2+ throughout Gait- deferred  NIHSS:  1a Level of Conscious.: 0 1b LOC Questions: 2 1c LOC Commands: 2 2 Best Gaze: 2 3 Visual: 0 4 Facial Palsy: 1 5a Motor Arm - left: 0 5b Motor Arm - Right: 0 6a Motor Leg - Left: 0 6b Motor Leg - Right: 1 7 Limb Ataxia: 0 8 Sensory: 0 9 Best Language: 2 10 Dysarthria: 1 11 Extinct and Inattention.: 0 TOTAL: 11     Medications  Current Facility-Administered Medications:    acetaminophen (TYLENOL) tablet 650 mg, 650 mg, Oral, Q6H PRN, Howerter, Justin B, DO   amLODipine (NORVASC) tablet 10 mg, 10 mg, Oral, Daily, Adhikari, Amrit, MD, 10 mg at 04/15/23 4098   aspirin chewable tablet 81 mg, 81 mg, Oral, Daily, Adhikari, Amrit, MD, 81 mg at 04/15/23 0923   atorvastatin (LIPITOR) tablet 80 mg, 80 mg, Oral, Daily, Adhikari, Amrit, MD, 80 mg at 04/15/23 1191   cholecalciferol (VITAMIN D3) 25 MCG (1000 UNIT) tablet 1,000 Units, 1,000 Units, Oral, Daily, Adhikari,  Amrit, MD   clopidogrel (PLAVIX) tablet 75 mg, 75 mg, Oral, Daily, Adhikari, Amrit, MD, 75 mg at 04/15/23 0922   enoxaparin (LOVENOX) injection 40 mg, 40 mg, Subcutaneous, Q24H, Adhikari, Amrit, MD, 40 mg at 04/15/23 0960   gabapentin (NEURONTIN) capsule 300 mg, 300 mg, Oral, TID, Burnadette Pop, MD, 300 mg at 04/15/23 2108   hydrALAZINE (APRESOLINE) tablet 50 mg, 50 mg, Oral, Q8H, Adhikari, Amrit, MD, 50 mg at 04/16/23 4540   hydrochlorothiazide (HYDRODIURIL) tablet 12.5 mg, 12.5 mg,  Oral, Daily, Adhikari, Amrit, MD, 12.5 mg at 04/15/23 9811   labetalol (NORMODYNE) injection 10 mg, 10 mg, Intravenous, Q2H PRN, Burnadette Pop, MD, 10 mg at 04/15/23 1308   losartan (COZAAR) tablet 50 mg, 50 mg, Oral, Daily, Adhikari, Amrit, MD, 50 mg at 04/15/23 9147   naloxone [facility]) injection 0.4 mg, 0.4 mg, Intravenous, PRN, Howerter, Justin B, DO   ondansetron (ZOFRAN) injection 4 mg, 4 mg, Intravenous, Q6H PRN, Howerter, Justin B, DO   pantoprazole (PROTONIX) EC tablet 40 mg, 40 mg, Oral, Daily, Adhikari, Amrit, MD, 40 mg at 04/15/23 8295   polyethylene glycol (MIRALAX / GLYCOLAX) packet 17 g, 17 g, Oral, Daily, Adhikari, Amrit, MD, 17 g at 04/15/23 6213   senna (SENOKOT) tablet 8.6 mg, 1 tablet, Oral, BID, Burnadette Pop, MD, 8.6 mg at 04/15/23 2108  Labs and Diagnostic Imaging   CBC:  Recent Labs  Lab 04/12/23 1255 04/13/23 0642  WBC 6.7 5.7  NEUTROABS 3.7  --   HGB 15.0 13.3  HCT 45.2 39.3  MCV 87.9 87.3  PLT 176 158    Basic Metabolic Panel:  Lab Results  Component Value Date   NA 140 04/13/2023   K 3.8 04/13/2023   CO2 23 04/13/2023   GLUCOSE 98 04/13/2023   BUN 9 04/13/2023   CREATININE 1.00 04/13/2023   CALCIUM 8.9 04/13/2023   GFRNONAA >60 04/13/2023   GFRAA 86 02/04/2019   Lipid Panel:  Lab Results  Component Value Date   LDLCALC 124 (H) 04/13/2023   HgbA1c:  Lab Results  Component Value Date   HGBA1C 5.9 (H) 04/13/2023   Urine Drug Screen:     Component Value Date/Time   LABOPIA NONE DETECTED 04/12/2023 1255   COCAINSCRNUR NONE DETECTED 04/12/2023 1255   LABBENZ NONE DETECTED 04/12/2023 1255   AMPHETMU NONE DETECTED 04/12/2023 1255   THCU POSITIVE (A) 04/12/2023 1255   LABBARB NONE DETECTED 04/12/2023 1255    Alcohol Level     Component Value Date/Time   ETH <5 12/05/2016 1014   INR  Lab Results  Component Value Date   INR 1.0 04/12/2023   APTT  Lab Results  Component Value Date   APTT 41 (H) 12/05/2016   AED levels: No  results found for: "PHENYTOIN", "ZONISAMIDE", "LAMOTRIGINE", "LEVETIRACETA"  CT Head without contrast 1/27 (Personally reviewed): No definite new infarcts, expected evolution of recently seen ischemic infarcts in left MCA territory  CT angio Head and Neck with contrast 1/27 (Personally reviewed): Proximal left M1 occlusion with only minimal collateralization of left MCA branch vessels, moderate proximal right M1 stenosis  MRI Brain 1/23 (Personally reviewed): Scattered acute to early subacute ischemic infarcts in left MCA distribution, few scattered late subacute to chronic left MCA infarct, chronic left cerebellar infarct  Assessment   Posey Jasmin is a 58 y.o. female with history of left cerebellar stroke, hyperlipidemia, hypertension, obesity, IBS, hiatal hernia, peripheral neuropathy, multiple myeloma and GERD who originally presented with 1 or 2 weeks of  word finding difficulties and was found to have subacute left MCA territory infarct with left MCA stenosis.  Who originally presented with 1 or 2 weeks of word finding difficulties and was found to have subacute left MCA territory infarcts with left MCA stenosis.  This morning, she was noted to have global aphasia, and code stroke was called.  CT head revealed no hemorrhage and no new ischemic infarct, but CT angiogram demonstrated left M1 occlusion.  Consent was obtained from patient's son Apolinar Junes for mechanical thrombectomy, and patient proceeded to IR.  Recommendations  - Patient to proceed to interventional radiology for mechanical thrombectomy - Transfer to 4 N. ICU - Post mechanical thrombectomy orders per interventional radiologist ______________________________________________________________________   Signed, Cortney Harland Dingwall, NP [facility]    Attending Neurohospitalist Addendum Patient seen and examined with APP/Resident. Agree with the history and physical as documented above. Agree with the  plan as documented, which I helped formulate. I have edited the note above to reflect my full findings and recommendations. I have independently reviewed the chart, obtained history, review of systems and examined the patient.I have personally reviewed pertinent head/neck/spine imaging (CT/MRI). Please feel free to call with any questions.  This patient is critically ill and at significant risk of neurological worsening, death and care requires constant monitoring of vital signs, hemodynamics,respiratory and cardiac monitoring, neurological assessment, discussion with family, other specialists and medical decision making of high complexity. I spent 70 minutes of neurocritical care time  in the care of  this patient. This was time spent independent of any time provided by nurse practitioner or PA.  Bing Neighbors, MD [facility] [phone]  If 7pm- 7am, please page neurology on call as listed in AMION.

## 2023-04-16 NOTE — Code Documentation (Signed)
Stroke Response Nurse Documentation Code Documentation  Tymara Saur is a 58 y.o. female admitted to [facility]  on 04/12/2023 for altered mental status and slurred speech with past medical hx significant of stroke, hypertension, hyperlipidemia, and obesity. On aspirin 81 mg daily and clopidogrel 75 mg daily.    Patient on 3W unit where she was LKW at 0915 and RN went to room to administer 1000 medications and patient was not speaking. When RN attempted to assess her, she leaned over to her side. RRT called and stroke team activated code stroke.   Stroke team at the bedside after patient activation. CBG 123. BP 132/77. Patient to CT with team. NIHSS 11, see documentation for details and code stroke times. Patient with disoriented, not following commands, left gaze preference , right hemianopia, right facial droop, right leg weakness, Global aphasia , and dysarthria  on exam. The following imaging was completed:  CT Head and CTA. Delay in CTA due to difficulty obtaining an appropriate sized IV. Patient is not a candidate for IV Thrombolytic due to recent stroke. Patient is a candidate for IR due to left MCA occlusion seen on CT. Delay in activating IR due to difficulty reaching family. Consent obtained. Patient taken to IR. Covid swab sent to lab.    Care/Plan: admit to ICU for post IR care.   Bedside handoff with RN.    Ferman Hamming Stroke Response RN

## 2023-04-16 NOTE — Procedures (Signed)
INR.  Status post left concurrent arteriogram.  Right CFA approach.  Findings.  Occluded left middle cerebral artery M1 segment proximally.  Status post complete revascularization of occluded left middle cerebral M1 segment with 1 pass with a 4 mm x 40 mm with aspiration followed by  balloon angioplasty of occluded left MCA M1 segment followed by placement of a 2 mm x 12 mm Onyx frontier balloon mountable stent for underlying severe intracranial arteriosclerosis, achieving a TICI 3 revascularization.Marland Kitchen  Post CT brain demonstrates small focal areas of contrast staining in the  previous subacute infarcts.  No gross intracranial hemorrhage evident.  Hemostasis at the right groin puncture site achieved with manual compression with quick clot for approximately 25 minutes due to severe underlying arteriosclerosis in the proximal right common femoral artery.  Distal pulses. Both PTs dopplerable . DPs not dopllerable unchanged from prior to the procedure.  Patient left intubated due to labile hypertension during the procedure.  Meds .  Aspirin 81 mg and Brilinta 90 mg via orogastric tube prior to the balloon angioplasty.  Half bolus dose of  IV cangrelor followed by half dose infusion for 4 hours.(Stop at 18.14 hrs)  CT of the brain to be obtained at 18.14 hrs  Let neuro hospitalist know when  the patient goes down. For the CT.  S.Yareni Creps MD

## 2023-04-16 NOTE — Progress Notes (Signed)
Pt transported to CT and back to 4N30 on vent without any complications.

## 2023-04-16 NOTE — Progress Notes (Signed)
BP remains labile, titrations in progress, MD at bedside.

## 2023-04-16 NOTE — Progress Notes (Signed)
OGT at 45 cm. Report reads to advance. Advanced to 56. + placement and gastric contents. Will order KUB to confirm before giving enteral meds.

## 2023-04-16 NOTE — Progress Notes (Signed)
OT Cancellation Note  Patient Details Name: Brittany [name] MRN: [id number] DOB: 12-Jul-1965   Cancelled Treatment:    Reason Eval/Treat Not Completed: Patient at procedure or test/ unavailable (OTF - code stroke CT)  Donia Pounds 04/16/2023, 11:32 AM

## 2023-04-16 NOTE — Anesthesia Preprocedure Evaluation (Signed)
Anesthesia Evaluation  Patient identified by MRN, date of birth, ID bandGeneral Assessment Comment:Pt awake. Can follow commands on the left side.   Reviewed: Allergy & Precautions, NPO status , Patient's Chart, lab work & pertinent test resultsPreop documentation limited or incomplete due to emergent nature of procedure.  Airway Mallampati: II  TM Distance: >3 FB Neck ROM: Full    Dental  (+) Dental Advisory Given   Pulmonary neg pulmonary ROS, former smoker   Pulmonary exam normal breath sounds clear to auscultation       Cardiovascular hypertension, Pt. on medications Normal cardiovascular exam Rhythm:Regular Rate:Normal  TTE 2025 1. Mild mid cavitary gradient. Peak velocity 0.94 m/s. Peak gradient 3.5  mmHg. With Valsalva, velocity increases to 1.2 m/s and peak gradient 5.6  mmHg. Left ventricular ejection fraction, by estimation, is 60 to 65%. The  left ventricle has normal  function. The left ventricle has no regional wall motion abnormalities.  There is severe left ventricular hypertrophy. Left ventricular diastolic  parameters are consistent with Grade I diastolic dysfunction (impaired  relaxation).   2. Right ventricular systolic function is normal. The right ventricular  size is normal.   3. Left atrial size was moderately dilated.   4. The mitral valve is normal in structure. No evidence of mitral valve  regurgitation. No evidence of mitral stenosis.   5. The aortic valve is normal in structure. Aortic valve regurgitation is  not visualized. No aortic stenosis is present.   6. The inferior vena cava is normal in size with greater than 50%  respiratory variability, suggesting right atrial pressure of 3 mmHg.     Neuro/Psych  PSYCHIATRIC DISORDERS Anxiety     CVA    GI/Hepatic hiatal hernia,GERD  ,,(+)     substance abuse  marijuana use  Endo/Other  negative endocrine ROS    Renal/GU negative Renal ROS  negative  genitourinary   Musculoskeletal  (+) Arthritis ,    Abdominal   Peds  Hematology  (+) Blood dyscrasia (multiple myeloma)   Anesthesia Other Findings   Reproductive/Obstetrics                             Anesthesia Physical Anesthesia Plan  ASA: 3 and emergent  Anesthesia Plan: General   Post-op Pain Management:    Induction: Intravenous and Rapid sequence  PONV Risk Score and Plan: 3 and Dexamethasone and Ondansetron  Airway Management Planned: Oral ETT  Additional Equipment: Arterial line  Intra-op Plan:   Post-operative Plan: Extubation in OR and Possible Post-op intubation/ventilation  Informed Consent: I have reviewed the patients History and Physical, chart, labs and discussed the procedure including the risks, benefits and alternatives for the proposed anesthesia with the patient or authorized representative who has indicated his/her understanding and acceptance.     Dental advisory given  Plan Discussed with: CRNA  Anesthesia Plan Comments:        Anesthesia Quick Evaluation

## 2023-04-16 NOTE — Progress Notes (Signed)
Went in to give patient morning meds and found patient sitting edge of bed and to have AMS and confusion. While trying to assess patient, she was leaning to the right side unable to support herself sitting up. Patient noted to have Right facial droop and was not following any commands. RN called Rapid response and paged provider. Rapid response came to bedside and NIH had increased from a 3 to an 11. Paged Neuro and code stroke was called. Escorted down to CT by RN

## 2023-04-16 NOTE — Sedation Documentation (Signed)
Hand off with Consuella Lose, RN on 4N.  Patient right femoral site remains level 0 and dressing of quick clot and tegaderm is clean/dry/intact.  Sheath was pulled at 1450 without a closure device. Pressure was held until 1508.  Patient has Cangrelor infusing that was started at 1414.  Dr. Corliss Skains wants that to run for 4 hours and have a CT scan done at the 4 hour mark which will be 1814.

## 2023-04-16 NOTE — Progress Notes (Signed)
PROGRESS NOTE  Brittany [name]  ZOX:[id number] DOB: [birth year] DOA: 04/12/2023 PCP: Clayborne Dana, NP   Brief Narrative:  Brittany [name] is a 58 y.o. female with medical history significant of stroke, hypertension, hyperlipidemia, obesity who presented from home with complaint of difficulty with her speech .  CT angiogram of the head and neck done at  [facility] showed areas of hypodensity in the left cerebral hemisphere, new from the prior exam , concerning for acute to subacute infarcts.  Also showed subsegmental occlusion of distal MCA branches.  Patient was transferred to [facility] for further workup with MRI and neurology evaluation.  MRI confirmed acute/subacute infarcts on the left cerebral hemisphere consistent with left MCA territory stroke.  Neurology consulted and following.  Started on stroke workup.  PT recommending acute inpatient rehab.  Became confused all of a sudden this morning, CT head pending.  Assessment & Plan:  Principal Problem:   Acute ischemic stroke [facility]) Active Problems:   Essential hypertension   Hypokalemia   GERD (gastroesophageal reflux disease)   History of CVA (cerebrovascular accident)   Acute ischemic stroke: Presented  with  speech changes(mainly word finding difficulty), some slurred speech, confusion for last 2 weeks.  CT head and neck showed areas of hypodensity in the left cerebral hemisphere, new from the prior exam , concerning for acute to subacute infarcts.  Also showed subsegmental occlusion of distal MCA branches.  Patient was transferred to [facility] for further workup with MRI and neurology evaluation.  Stroke workup initiated.  PT/OT/speech consulted, echo. A1c of 5.9, LDL of 124 Patient has history of prior stroke but without significant residual deficits.  Not taking any antiplatelet agents at home.  Ambulates with the help of walker. Started on aspirin and Plavix, continue Lipitor at high dose.  Neurology recommending aspirin and Plavix  for 3 weeks followed by aspirin alone.  Recommended Zio patch on discharge PT recommending acute inpatient rehab.  Acute encephalopathy: During my evaluation this morning, she was alert and oriented.  I was later notified that she suddenly became  confused.  Hemodynamically stable.  CT head ordered.  I have requested neurology team to follow-up on her today.  History of hypertension: Takes amlodipine, losartan, hydralazine at home.  Restarted amlodipine, losartan, hydralazine, hydrochlorothiazide.  Blood pressure better now  Hyperlipidemia: Takes Lipitor at home.  Currently been continued   Hypokalemia: Supplemented with potassium and corrected   Mild AKI: Resolved with IV fluid   GERD: Continue Protonix   Marijuana use: UDS positive for THC  Abdominal pain/constipation: Resolved.  Abdominal x-ray did not show any acute findings.  Continue bowel regimen        DVT prophylaxis:enoxaparin (LOVENOX) injection 40 mg Start: 04/13/23 1000     Code Status: Prior  Family Communication: Called and discussed with mother Julieanne Cotton on phone on 1/24  Patient status:Inpatient  Patient is from :home  Anticipated discharge to: [facility] vs CIR  Estimated DC date :1-2 dys   Consultants: Neurology  Procedures:None  Antimicrobials:  Anti-infectives (From admission, onward)    None       Subjective: Patient seen and examined at bedside today.  During my evaluation, she was comfortable lying in bed, sleeping.  Was  alert and oriented and denied any complaints. I was later referred that she suddenly became confused   Objective: Vitals:   04/16/23 0351 04/16/23 0735 04/16/23 1015 04/16/23 1032  BP: 112/66 115/70 130/78 132/77  Pulse: 100 (!) 101 (!) 119 (!) 118  Resp: 18 17 18    Temp: (!) 97.2 F (36.2 C) 98.1 F (36.7 C) 98.7 F (37.1 C) 98.7 F (37.1 C)  TempSrc: Oral Oral Oral Oral  SpO2: 97% 98%  100%  Weight:      Height:       No intake or output data in the 24 hours  ending 04/16/23 1041  Filed Weights   04/12/23 2200  Weight: 73 kg    Examination:   General exam: Overall comfortable, not in distress HEENT: PERRL Respiratory system:  no wheezes or crackles  Cardiovascular system: S1 & S2 heard, RRR.  Gastrointestinal system: Abdomen is nondistended, soft and nontender. Central nervous system: Alert and oriented, left-sided residual hemiparesis, word finding difficulty Extremities: No edema, no clubbing ,no cyanosis Skin: No rashes, no ulcers,no icterus         Data Reviewed: I have personally reviewed following labs and imaging studies  CBC: Recent Labs  Lab 04/12/23 1255 04/13/23 0642  WBC 6.7 5.7  NEUTROABS 3.7  --   HGB 15.0 13.3  HCT 45.2 39.3  MCV 87.9 87.3  PLT 176 158   Basic Metabolic Panel: Recent Labs  Lab 04/12/23 1255 04/13/23 0642  NA 135 140  K 3.3* 3.8  CL 101 109  CO2 20* 23  GLUCOSE 161* 98  BUN 12 9  CREATININE 1.11* 1.00  CALCIUM 9.3 8.9     No results found for this or any previous visit (from the past 240 hours).   Radiology Studies: DG Abd Portable 1V Result Date: 04/14/2023 CLINICAL DATA:  Abdominal pain. EXAM: PORTABLE ABDOMEN - 1 VIEW COMPARISON:  CT 05/30/2018 FINDINGS: No bowel dilatation to suggest obstruction. Air within prominent but not abnormally dilated transverse and descending colon. Formed stool in the ascending colon. No abnormal rectal distention. Pelvic phleboliths. No abdominal radiopaque calculi. On limited assessment, no acute osseous findings. IMPRESSION: Normal bowel gas pattern.  No explanation for pain. Electronically Signed   By: Narda Rutherford M.D.   On: 04/14/2023 15:03    Scheduled Meds:  amLODipine  10 mg Oral Daily   aspirin  81 mg Oral Daily   atorvastatin  80 mg Oral Daily   cholecalciferol  1,000 Units Oral Daily   clopidogrel  75 mg Oral Daily   enoxaparin (LOVENOX) injection  40 mg Subcutaneous Q24H   gabapentin  300 mg Oral TID   hydrALAZINE  50 mg Oral  Q8H   hydrochlorothiazide  12.5 mg Oral Daily   losartan  50 mg Oral Daily   pantoprazole  40 mg Oral Daily   polyethylene glycol  17 g Oral Daily   senna  1 tablet Oral BID   Continuous Infusions:     LOS: 3 days   Burnadette Pop, MD [facility] P1/27/2025, 10:41 AM

## 2023-04-17 ENCOUNTER — Other Ambulatory Visit (HOSPITAL_COMMUNITY): Payer: Self-pay

## 2023-04-17 ENCOUNTER — Inpatient Hospital Stay (HOSPITAL_COMMUNITY): Payer: Medicaid Other

## 2023-04-17 ENCOUNTER — Telehealth (HOSPITAL_COMMUNITY): Payer: Self-pay | Admitting: Pharmacy Technician

## 2023-04-17 ENCOUNTER — Encounter (HOSPITAL_COMMUNITY): Payer: Self-pay | Admitting: Interventional Radiology

## 2023-04-17 DIAGNOSIS — I639 Cerebral infarction, unspecified: Secondary | ICD-10-CM | POA: Diagnosis not present

## 2023-04-17 DIAGNOSIS — Z87891 Personal history of nicotine dependence: Secondary | ICD-10-CM

## 2023-04-17 DIAGNOSIS — I69391 Dysphagia following cerebral infarction: Secondary | ICD-10-CM | POA: Diagnosis not present

## 2023-04-17 DIAGNOSIS — I10 Essential (primary) hypertension: Secondary | ICD-10-CM | POA: Diagnosis not present

## 2023-04-17 DIAGNOSIS — E785 Hyperlipidemia, unspecified: Secondary | ICD-10-CM | POA: Diagnosis not present

## 2023-04-17 LAB — CBC WITH DIFFERENTIAL/PLATELET
Abs Immature Granulocytes: 0.02 10*3/uL (ref 0.00–0.07)
Basophils Absolute: 0 10*3/uL (ref 0.0–0.1)
Basophils Relative: 0 %
Eosinophils Absolute: 0 10*3/uL (ref 0.0–0.5)
Eosinophils Relative: 0 %
HCT: 36 % (ref 36.0–46.0)
Hemoglobin: 11.8 g/dL — ABNORMAL LOW (ref 12.0–15.0)
Immature Granulocytes: 0 %
Lymphocytes Relative: 19 %
Lymphs Abs: 1.5 10*3/uL (ref 0.7–4.0)
MCH: 29.4 pg (ref 26.0–34.0)
MCHC: 32.8 g/dL (ref 30.0–36.0)
MCV: 89.6 fL (ref 80.0–100.0)
Monocytes Absolute: 0.7 10*3/uL (ref 0.1–1.0)
Monocytes Relative: 9 %
Neutro Abs: 5.8 10*3/uL (ref 1.7–7.7)
Neutrophils Relative %: 72 %
Platelets: 164 10*3/uL (ref 150–400)
RBC: 4.02 MIL/uL (ref 3.87–5.11)
RDW: 15.3 % (ref 11.5–15.5)
WBC: 8.1 10*3/uL (ref 4.0–10.5)
nRBC: 0 % (ref 0.0–0.2)

## 2023-04-17 LAB — GLUCOSE, CAPILLARY
Glucose-Capillary: 108 mg/dL — ABNORMAL HIGH (ref 70–99)
Glucose-Capillary: 117 mg/dL — ABNORMAL HIGH (ref 70–99)
Glucose-Capillary: 124 mg/dL — ABNORMAL HIGH (ref 70–99)
Glucose-Capillary: 88 mg/dL (ref 70–99)
Glucose-Capillary: 90 mg/dL (ref 70–99)
Glucose-Capillary: 96 mg/dL (ref 70–99)
Glucose-Capillary: 98 mg/dL (ref 70–99)

## 2023-04-17 LAB — BASIC METABOLIC PANEL
Anion gap: 10 (ref 5–15)
BUN: 21 mg/dL — ABNORMAL HIGH (ref 6–20)
CO2: 19 mmol/L — ABNORMAL LOW (ref 22–32)
Calcium: 7.9 mg/dL — ABNORMAL LOW (ref 8.9–10.3)
Chloride: 111 mmol/L (ref 98–111)
Creatinine, Ser: 1.24 mg/dL — ABNORMAL HIGH (ref 0.44–1.00)
GFR, Estimated: 51 mL/min — ABNORMAL LOW (ref >60)
Glucose, Bld: 93 mg/dL (ref 70–99)
Potassium: 3.7 mmol/L (ref 3.5–5.1)
Sodium: 140 mmol/L (ref 135–145)

## 2023-04-17 LAB — TRIGLYCERIDES: Triglycerides: 287 mg/dL — ABNORMAL HIGH (ref <150)

## 2023-04-17 MED ORDER — ORAL CARE MOUTH RINSE: 15.0000 mL | OROMUCOSAL | Status: DC | PRN

## 2023-04-17 MED ORDER — ORAL CARE MOUTH RINSE
15.0000 mL | OROMUCOSAL | Status: DC
  Administered 2023-04-17 – 2023-04-20 (×14): 15 mL via OROMUCOSAL

## 2023-04-17 MED ORDER — DOCUSATE SODIUM 100 MG PO CAPS
100.0000 mg | ORAL_CAPSULE | Freq: Every day | ORAL | Status: DC
  Administered 2023-04-17 – 2023-04-20 (×4): 100 mg via ORAL
  Filled 2023-04-17 (×4): qty 1

## 2023-04-17 MED ORDER — BISACODYL 10 MG RE SUPP
10.0000 mg | Freq: Once | RECTAL | Status: AC
  Administered 2023-04-17: 10 mg via RECTAL
  Filled 2023-04-17: qty 1

## 2023-04-17 NOTE — Progress Notes (Signed)
Speech Language Pathology Treatment: Cognitive-Linquistic  Patient Details Name: Brittany [name] MRN: [id number] DOB: [birth year] Today's Date: 04/17/2023 Time: 1355-1405 SLP Time Calculation (min) (ACUTE ONLY): 10 min  Assessment / Plan / Recommendation Clinical Impression  Pt was seen for aphasia treatment s/p recent episode involving thrombectomy and stent placement. Pt's expressive language is severely impaired with only one instance of verbal language and difficulties with expressing thoughts using gesturing. Receptive language appears moderately impaired, but pt can follow basic commands and information with use of verbal, visual, and tactile cueing. During a functional feeding task, pt experienced pelvic pain and attempted to verbalize about her pain, but required multimodal cueing to express it. Pt followed commands during task well, only needing min motivation from family to participate. SLP engaged in family education about the nature of aphasia and the trajectory of aphasia recovery. SLP also taught family functional communication strategies to help cue and promote pt's expressive language. Future sessions need to focus on practicing functional communication strategies with pt.   HPI HPI: Pt is a 58 y.o. female with medical history significant of stroke, hypertension, hyperlipidemia, obesity who presented at [facility] from home with complaint of difficulty with her speech on 01/23. CT angiogram showed areas of hypodensity in the left cerebral hemisphere. Patient was transferred to [facility] for further workup with MRI and neurology evaluation. MRI confirmed acute/subacute infarcts on the left cerebral hemisphere consistent with left MCA territory stroke. Pt recently received thrombectomy and stent placement in left MCA due to decreased consciousness and right side weakness on 01/27, leading to intubation that ended on 01/28.  PMHx significant of stroke, hypertension, hyperlipidemia, obesity.       SLP Plan  Continue with current plan of care      Recommendations for follow up therapy are one component of a multi-disciplinary discharge planning process, led by the attending physician.  Recommendations may be updated based on patient status, additional functional criteria and insurance authorization.    Recommendations                         Frequent or constant Supervision/Assistance Aphasia (R47.01);Cognitive communication deficit (M57.846)     Continue with current plan of care     Brittany [name]  04/17/2023, 2:25 PM

## 2023-04-17 NOTE — Progress Notes (Signed)
Verbal Order per Dr. Corliss Skains to change blood pressure goals SBPs 120-160  Signed,  Charmian Muff, RN  5:00 AM 04/17/2023

## 2023-04-17 NOTE — Progress Notes (Signed)
eLink Physician-Brief Progress Note Patient Name: Brittany [name] DOB: June 15, 1965 MRN: [id number]   Date of Service  04/17/2023  HPI/Events of Note  KUB unremarkable.  eICU Interventions          Migdalia Dk 04/17/2023, 9:24 PM

## 2023-04-17 NOTE — Telephone Encounter (Signed)
Patient Product/process development scientist completed.    The patient is insured through [facility] MEDICAID.     Ran test claim for Brilinta 90 mg and the current 30 day co-pay is $4.00.   This test claim was processed through [facility]- copay amounts may vary at other pharmacies due to pharmacy/plan contracts, or as the patient moves through the different stages of their insurance plan.     Roland Earl, CPHT Pharmacy Technician III Certified Patient Advocate [facility] Patient Advocate Team Direct Number: [phone]  Fax: [phone]

## 2023-04-17 NOTE — Progress Notes (Signed)
Occupational Therapy Treatment Patient Details Name: Brittany [name] MRN: [id number] DOB: 21-Apr-1965 Today's Date: 04/17/2023   History of present illness 58 yo female presents to ED on 1/23 with AMS, slurred speech. MRI 1/23 revealed scattered acute to early subacute ischemic infarcts involving L cerebral hemisphere, L MCA distribution. Rapid response called 1/27 for AMS, R weakness; s/p L arteriogram via R CFA approach for occluded L M1 segment on 1/27. ETT 1/27-1/28. PHMx: Anemia, arthritis, elevated cholesterol, GERD, heart murmur, female bladder prolapse, IBS, hiatal hernia, stroke, multiple myeloma, SOB, and peripheral neuropathy.   OT comments  Pt progressed to [facility] and returned to bed after unable to void. Pt fatigued and frustrated after transfer. Recommendation for Patient will benefit from intensive inpatient follow-up therapy, >3 hours/day       If plan is discharge home, recommend the following:  A little help with walking and/or transfers;A little help with bathing/dressing/bathroom;Assistance with cooking/housework;Direct supervision/assist for medications management;Direct supervision/assist for financial management;Assist for transportation;Help with stairs or ramp for entrance   Equipment Recommendations  None recommended by OT    Recommendations for Other Services Rehab consult    Precautions / Restrictions Precautions Precautions: Fall Precaution Comments: R inattention, R hemiparesis Restrictions Weight Bearing Restrictions Per Provider Order: No       Mobility Bed Mobility Overal bed mobility: Needs Assistance Bed Mobility: Sit to Supine       Sit to supine: Mod assist, +2 for physical assistance   General bed mobility comments: assist for trunk and LE management, especially attention to RUE and LE and LE translation. Increased time, step-wise sequencing cues    Transfers Overall transfer level: Needs assistance       Stand pivot transfers: Mod  assist, +2 physical assistance               Balance                                           ADL either performed or assessed with clinical judgement   ADL Overall ADL's : Needs assistance/impaired                         Toilet Transfer: +2 for physical assistance;Moderate assistance     Toileting - Clothing Manipulation Details (indicate cue type and reason): unable to void            Extremity/Trunk Assessment Upper Extremity Assessment Upper Extremity Assessment: Generalized weakness RUE Deficits / Details: no active RUE movement, flaccid, no pain or light touch response   Lower Extremity Assessment Lower Extremity Assessment: Defer to PT evaluation RLE Deficits / Details: 1/5 hip flexors and knee extensors, otherwise no active motion noted. no pain or light touch response RLE Coordination: decreased gross motor;decreased fine motor   Cervical / Trunk Assessment Cervical / Trunk Assessment: Normal    Vision       Perception     Praxis      Cognition Arousal: Obtunded Behavior During Therapy: Flat affect Overall Cognitive Status: Difficult to assess                                          Exercises      Shoulder Instructions       General Comments  pt on BSC and decreased arousal noted and returned to supine    Pertinent Vitals/ Pain       Pain Assessment Pain Assessment: Faces Faces Pain Scale: Hurts little more Pain Location: abdomen Pain Descriptors / Indicators: Grimacing, Guarding Pain Intervention(s): Monitored during session, Repositioned  Home Living Family/patient expects to be discharged to:: Private residence Living Arrangements: Alone Available Help at Discharge: Family;Personal care attendant;Available PRN/intermittently Type of Home: House Home Access: Level entry     Home Layout: One level     Bathroom Shower/Tub: Chief Strategy Officer: Handicapped  height Bathroom Accessibility: Yes   Home Equipment: Rollator (4 wheels);BSC/3in1   Additional Comments: pt's son is in [place], elderly parents are in [place]. Pt has a PCA.      Prior Functioning/Environment              Frequency  Min 1X/week        Progress Toward Goals  OT Goals(current goals can now be found in the care plan section)  Progress towards OT goals: Not progressing toward goals - comment  Acute Rehab OT Goals Patient Stated Goal: to have bowel movement OT Goal Formulation: Patient unable to participate in goal setting Time For Goal Achievement: 04/27/23 Potential to Achieve Goals: Good ADL Goals Pt Will Perform Grooming: with modified independence Pt Will Perform Upper Body Dressing: with modified independence Pt Will Transfer to Toilet: with modified independence;ambulating Additional ADL Goal #1: Pt will indep complete IADL medicaiton management task Additional ADL Goal #2: Pt will indep complete 2-3 step way finding task to demonstrate improved working memory  Plan      Co-evaluation    PT/OT/SLP Co-Evaluation/Treatment: Yes Reason for Co-Treatment: For patient/therapist safety          AM-PAC OT "6 Clicks" Daily Activity     Outcome Measure   Help from another person eating meals?: A Lot Help from another person taking care of personal grooming?: A Lot Help from another person toileting, which includes using toliet, bedpan, or urinal?: A Lot Help from another person bathing (including washing, rinsing, drying)?: A Lot Help from another person to put on and taking off regular upper body clothing?: A Lot Help from another person to put on and taking off regular lower body clothing?: A Lot 6 Click Score: 12    End of Session Equipment Utilized During Treatment: Gait belt  OT Visit Diagnosis: Unsteadiness on feet (R26.81);Other abnormalities of gait and mobility (R26.89);Muscle weakness (generalized) (M62.81);Cognitive communication deficit  (R41.841) Symptoms and signs involving cognitive functions: Cerebral infarction   Activity Tolerance Patient limited by fatigue   Patient Left in bed;with call bell/phone within reach;with bed alarm set;with nursing/sitter in room   Nurse Communication Mobility status;Precautions        Time: 1210 (1210)-1221 OT Time Calculation (min): 11 min  Charges: OT General Charges $OT Visit: 1 Visit OT Treatments $Self Care/Home Management : 8-22 mins   Brynn, OTR/L  Acute Rehabilitation Services Office: [phone] .   Mateo Flow 04/17/2023, 4:01 PM

## 2023-04-17 NOTE — Progress Notes (Signed)
Pt unable to stool today despite many PRN medications and expressing discomfort. She has been slightly tachycardic (<110) all afternoon, Pt is anxious, Rochel Brome, NP aware. Attempted Soap suds enema with no success. KUB ordered. Pt is not expressing any discomfort at this time.

## 2023-04-17 NOTE — Evaluation (Signed)
Clinical/Bedside Swallow Evaluation Patient Details  Name: Brittany [name] MRN: [id number] Date of Birth: 1965-03-22  Today's Date: 04/17/2023 Time: SLP Start Time (ACUTE ONLY): 1345 SLP Stop Time (ACUTE ONLY): 1355 SLP Time Calculation (min) (ACUTE ONLY): 10 min  Past Medical History:  Past Medical History:  Diagnosis Date   Anemia    Arthritis    trigger finger in left hand   Complication of anesthesia    per pt, hard to wake up!   Elevated cholesterol    Female bladder prolapse    per urologist, does not have prolaspe   GERD (gastroesophageal reflux disease)    Heart murmur    pt unsure.   Hiatal hernia    Hypertension    IBS (irritable bowel syndrome)    Multiple myeloma (HCC) 2005   had partial chemo   Peripheral neuropathy    SOB (shortness of breath) on exertion    uses an inhaler   Stroke [facility]) 2017   paralysis left arm/uses a walker   Past Surgical History:  Past Surgical History:  Procedure Laterality Date   BONE BIOPSY  2005   in her back   COLONOSCOPY     over 10 years x3   ESOPHAGOGASTRODUODENOSCOPY     incomplete-over 10 years ago    RADIOLOGY WITH ANESTHESIA N/A 04/16/2023   Procedure: IR WITH ANESTHESIA;  Surgeon: Julieanne Cotton, MD;  Location: [facility] OR;  Service: Radiology;  Laterality: N/A;   TEE WITHOUT CARDIOVERSION N/A 12/08/2016   Procedure: TRANSESOPHAGEAL ECHOCARDIOGRAM (TEE);  Surgeon: Quintella Reichert, MD;  Location: [facility] ENDOSCOPY;  Service: Cardiovascular;  Laterality: N/A;   TUBAL LIGATION     UPPER GASTROINTESTINAL ENDOSCOPY     WISDOM TOOTH EXTRACTION     HPI:  Pt is a 58 y.o. female with medical history significant of stroke, hypertension, hyperlipidemia, obesity who presented at [facility] from home with complaint of difficulty with her speech on 01/23. CT angiogram showed areas of hypodensity in the left cerebral hemisphere. Patient was transferred to [facility] for further workup with MRI and neurology evaluation. MRI confirmed  acute/subacute infarcts on the left cerebral hemisphere consistent with left MCA territory stroke. Pt recently received thrombectomy and stent placement in left MCA due to decreased consciousness and right side weakness on 01/27, leading to intubation that ended on 01/28.  PMHx significant of stroke, hypertension, hyperlipidemia, obesity.    Assessment / Plan / Recommendation  Clinical Impression  Pt was seen for BSE s/p MCA stroke on 01/23 and recent thrombectomy and MCA stent placement on 01/27. Family was present. Pt displayed no signs of aspiration or dysphagia on all PO consistencies tested (thin, puree, solid). Pt could not participate in oral-motor exam due to expressive-receptive aphasia. Pt experienced head and pelvic pain during session which was addressed with her repositioning to her right side. During thin liquid trial, pt drank a whole cup of water with consecutive straw sips and no signs of aspiration. Family noted that pt did not have any swallowing complaints prior to session. Pt had upper dentures but they were too big to wear comfortably during meals. Dysphagia 3 (mech soft) is recommended due to pt's positioning, right arm weakness, and dentition to faciliate optimal safety for pt.      Aspiration Risk  No limitations    Diet Recommendation Thin liquid;Dysphagia 3 (Mech soft)    Liquid Administration via: Straw Medication Administration: Whole meds with liquid Supervision: Staff to assist with self feeding Postural Changes: Seated upright at  90 degrees (If possible, upright is recommended, but not fully required due to pt comfort.)    Other  Recommendations Oral Care Recommendations: Oral care BID    Recommendations for follow up therapy are one component of a multi-disciplinary discharge planning process, led by the attending physician.  Recommendations may be updated based on patient status, additional functional criteria and insurance authorization.  Follow up  Recommendations Acute inpatient rehab (3hours/day)      Assistance Recommended at Discharge Frequent or constant Supervision/Assistance  Functional Status Assessment    Frequency and Duration            Prognosis        Swallow Study   General Date of Onset: 04/12/23 HPI: Pt is a 58 y.o. female with medical history significant of stroke, hypertension, hyperlipidemia, obesity who presented at [facility] from home with complaint of difficulty with her speech on 01/23. CT angiogram showed areas of hypodensity in the left cerebral hemisphere. Patient was transferred to [facility] for further workup with MRI and neurology evaluation. MRI confirmed acute/subacute infarcts on the left cerebral hemisphere consistent with left MCA territory stroke. Pt recently received thrombectomy and stent placement in left MCA due to decreased consciousness and right side weakness on 01/27, leading to intubation that ended on 01/28.  PMHx significant of stroke, hypertension, hyperlipidemia, obesity. Type of Study: Bedside Swallow Evaluation Diet Prior to this Study: NPO Temperature Spikes Noted: No Respiratory Status: Room air History of Recent Intubation: Yes Total duration of intubation (days): 1 days Date extubated: 04/17/23 Behavior/Cognition: Alert;Cooperative;Requires cueing Oral Cavity Assessment: Within Functional Limits Oral Care Completed by SLP: No Oral Cavity - Dentition: Dentures, not available Vision: Functional for self-feeding Self-Feeding Abilities: Needs assist Patient Positioning: Postural control adequate for testing Baseline Vocal Quality: Not observed Volitional Cough: Other (Comment) (Was not tested.) Volitional Swallow:  (Was not tested.)    Oral/Motor/Sensory Function Overall Oral Motor/Sensory Function: Other (comment) (Due to aphasia, pt could not fully perform oral-motor/sensory exam.)   Ice Chips Ice chips: Not tested   Thin Liquid Thin Liquid: Within functional  limits Presentation: Cup;Straw    Nectar Thick Nectar Thick Liquid: Not tested   Honey Thick Honey Thick Liquid: Not tested   Puree Puree: Within functional limits Presentation: Spoon   Solid     Solid: Within functional limits Presentation: Self Fed      [name] 04/17/2023,3:01 PM

## 2023-04-17 NOTE — Progress Notes (Signed)
NAME:  Brittany [name], MRN:  [id number], DOB:  [birth year], LOS: 4 ADMISSION DATE:  04/12/2023, CONSULTATION DATE:  04/16/23 REFERRING MD:  deveshwar, CHIEF COMPLAINT:  left MCA stroke s/p angioplasty and stent   History of Present Illness:  The patient is a 58 year old female who presented to [facility] 04/12/23 with complaints of cognitive decline and slurred speech. 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 At baseline she walks with a cane and is able to live alone and complete ADLs independently. Over the last 2 weeks her mother has noticed slurred speech, confusion, and an inability to find words or identify objects. These symptoms had gotten worse over the last 2 days before admission.  [place] showed subacute strokes in the left MCA territory with possible short segment occlusion and distal MCA branches. She was then transferred to [facility]. ED labs were remarkable for potassium 3.3, Creatinine 1.11, THC positive. She was hemodynamically stable and alert at that time. MRI showed scattered acute to early subacute ischemic infarcts involving the left cerebral hemisphere. She was outside the window for invasive intervention or lytics.   Since admission she has been started on aspirin, plavix, and lipitor. Her aphasia was improving. Today (1/27) she had sudden onset confusion with right sided weakness, facial droop, and left sided gaze. Code stroke was activated. The patient was taken to IR found to have complete MCA occlusion. Underwent angioplasty and left MCA stent successfully placed. Critical care was consulted for post procedure care.   Pertinent  Medical History  Anemia, Arthritis, GERD, HTN, Stroke in 2017 with minimal deficits. Recent scattered left hemisphere strokes (this admission, 1/23)  Significant Hospital Events: Including procedures, antibiotic start and stop dates in addition to other pertinent events   1/23 admitted to  medicine for subacute stroke. CT/MRI with subacute left hemisphere strokes. Out of window for intervention 1/27 code stroke with MCA stent placed by IR and ccm consulted  1/28 Patient following commands, weaning, plan to extubate   Interim History / Subjective:  Patient following commands, able to move left side,  Right upper and lower extremities no purposeful movement or response to painful stimuli   Objective   Blood pressure 122/65, pulse 94, temperature 98.8 F (37.1 C), temperature source Axillary, resp. rate 14, height 5\' 2"  (1.575 m), weight 73 kg, SpO2 100%.    Vent Mode: PSV;CPAP FiO2 (%):  [40 %-100 %] 40 % Set Rate:  [16 bmp] 16 bmp Vt Set:  [400 mL] 400 mL PEEP:  [5 cmH20] 5 cmH20 Pressure Support:  [8 cmH20] 8 cmH20 Plateau Pressure:  [13 cmH20-17 cmH20] 13 cmH20   Intake/Output Summary (Last 24 hours) at 04/17/2023 1006 Last data filed at 04/17/2023 0900 Gross per 24 hour  Intake 2471.76 ml  Output 2275 ml  Net 196.76 ml   Filed Weights   04/12/23 2200  Weight: 73 kg    Examination: General: acutely ill adult female lying on ICU bed, on vent HEENT: Normocephalic, ETT, OG, poor dentition, pink MM Pulm: Clear, diminished throughout bases, no distress, weaning on vent Cards: s1, s2, RRR, no JVD, no MRG, having to doppler post tibs and dps BL, cap ref intact 3 sec, upper and lower extremities warm  Abs: BS soft, active Skin: no skin lesions, dry  Extremities: no edema, no acute deformities Neuro: PERRLA intact, right upper/lower extremity deficits-no movement to pain, left upper/lower extremities move purposely  GU: foley, intact  Resolved Hospital Problem list   AKI Abdominal pain/constipation Hypokalemia   Assessment & Plan:  Acute Left MCA stroke s/p MCA stent 1/27 superimposed on subacute scattered left hemisphere strokes left MCA stenosis Presented to the ED with subacute left hemisphere strokes manifesting aphasia and confusion. Subsequently had acute  stroke with right sided weakness, facial droop, and left gaze.  Follow up CT scan 1836 1/27 hyperdense material near left M1, correlating with hypodense area on prior CT, petechial hemorrhage vs contrast staining with no mass effect, s/p left M1 stent  P:  IR and Stroke team following, appreciate assistance and recs Continue ASA, lipitor, plavix  Continue neuroprotective measures- euglycemia, normothermia, normoxia, normocapnia May need follow up imaging-MRI defer to stroke team  BP goal per IR recs-120 to 160 SBP  Post operative ventilator management Patient weaning, following commands  P: Continue SBT, WUA with plans to extubate Wean off propofol, will dc  O2 Sat goal > 92%   Hypertension On losartan, amlodipine, hydrochlorothiazide, and hydralazine at home. SBP 200s post procedure IR increase BP goal to 120-160 SBP to ensure adequate perfusion to lower extremities, issues overnight obtaining post tib and DP even with doppler, but improving now  P: Continue SBP goal of 120-60  Off cleviprex, hold at this time Continue home antihypertensives losartan, amlodipine, hydralazine. Will continue to hold hydrochlorothiazide at this time. Have PRN labetolol if needed  Remove aline   AKI  Improved prior to 1/27 with Cr 1.59 to 1.24  Secondary to IR procedure  P: Continue to ensure adequate renal perfusion  Continue to monitor strict I/Os  Can remove foley, bladder scan if begins to retain  Hold hydrochlorothiazide at this time BMET daily, optimize electrolytes   Hyperlipidemia P: Continue statin   GERD P:  Continue IV protonix    Best Practice (right click and "Reselect all SmartList Selections" daily)   Diet/type: NPO DVT prophylaxis SCD Pressure ulcer(s): N/A GI prophylaxis: PPI Lines: will remove aline 1/28  Foley:   will remove foley 1/28  Code Status:  full code Last date of multidisciplinary goals of care discussion -family updated at beside on 1/28, plan to  extubate   Critical care time: 35 mins   Christian Irmgard Rampersaud AGACNP-BC   [facility] 04/17/2023, 10:41 AM  Please see [email or web address] for pager details.  From 7A-7P if no response, please call [phone]. After hours, please call ELink [phone].

## 2023-04-17 NOTE — Progress Notes (Signed)
Referring Physician(s): Dr. Bing Neighbors  Supervising Physician: Julieanne Cotton  Patient Status:  Brittany [name] - In-pt  Chief Complaint: L MCA infarct  Subjective: Extubated this AM. Aphasic. Follows most commands.  Left gaze preference, but does turn past midline. R sided facial droop, R sided weakness.  Easily frustrated   Allergies: Amitriptyline, Duloxetine, Naproxen, Other, Beef-derived drug products, Pork-derived products, Darvon [propoxyphene], Hydrocodone, Lactose intolerance (gi), Latex, Oxycodone, Percocet [oxycodone-acetaminophen], and Topamax [topiramate]  Medications: Prior to Admission medications   Medication Sig Start Date End Date Taking? Authorizing Provider  albuterol (VENTOLIN HFA) 108 (90 Base) MCG/ACT inhaler Inhale 2 puffs into the lungs every 4 (four) hours as needed for wheezing or shortness of breath. 02/24/22  Yes Ivonne Andrew, NP  atorvastatin (LIPITOR) 80 MG tablet Take 1 tablet (80 mg total) by mouth daily. 07/06/22 07/06/23 Yes Clayborne Dana, NP  gabapentin (NEURONTIN) 300 MG capsule Take 1 capsule (300 mg total) by mouth 3 (three) times daily. 07/07/22  Yes Clayborne Dana, NP  hydrALAZINE (APRESOLINE) 50 MG tablet Take 1 tablet (50 mg total) by mouth every 8 (eight) hours. 12/09/22 04/12/23 Yes Arrien, York Ram, MD  hydrochlorothiazide (HYDRODIURIL) 12.5 MG tablet Take 12.5 mg by mouth daily.   Yes [provider]  linaclotide Karlene Einstein) 72 MCG capsule Take 1 capsule (72 mcg total) by mouth daily before breakfast. 07/26/22  Yes Clayborne Dana, NP  losartan (COZAAR) 50 MG tablet Take 1 tablet (50 mg total) by mouth daily. 12/25/22 04/12/23 Yes Clayborne Dana, NP  omeprazole (PRILOSEC) 20 MG capsule Take 1 capsule (20 mg total) by mouth 2 (two) times daily before a meal. 02/24/22 04/12/23 Yes Ivonne Andrew, NP  amLODipine (NORVASC) 10 MG tablet Take 1 tablet (10 mg total) by mouth daily. Patient not taking: Reported on 04/12/2023 12/09/22  01/08/23  Arrien, York Ram, MD  chlorthalidone (HYGROTON) 25 MG tablet Take 1 tablet (25 mg total) by mouth daily. Patient not taking: Reported on 04/12/2023 12/09/22 01/08/23  Arrien, York Ram, MD  triamcinolone cream (KENALOG) 0.1 % APPLY 1 APPLICATION TOPICALLY 2 (TWO) TIMES DAILY. Patient not taking: Reported on 04/12/2023 11/09/21   Ivonne Andrew, NP     Vital Signs: BP (!) 122/56   Pulse (!) 103   Temp 99.9 F (37.7 C) (Oral)   Resp 18   Ht 5\' 2"  (1.575 m)   Wt 160 lb 15 oz (73 kg)   SpO2 96%   BMI 29.44 kg/m   Physical Exam NAD, alert Neuro: Alert, able to follow most commands, aphasic. Right facial droop.  EOMs intact.  Left gaze preference but will turn R with prompting. Right sided weakness in upper and lower extremities.  Groin: soft, non-tender.  No evidence of hematoma or pseudoaneurysm Pulses: no DP pre or post procedure.  Feet warm.  No evidence of occlusion.     Imaging: CT HEAD WO CONTRAST ( ) Result Date: 04/16/2023 CLINICAL DATA:  Stroke, follow-up EXAM: CT HEAD WITHOUT CONTRAST TECHNIQUE: Contiguous axial images were obtained from the base of the skull through the vertex without intravenous contrast. RADIATION DOSE REDUCTION: This exam was performed according to the departmental dose-optimization program which includes automated exposure control, adjustment of the mA and/or kV according to patient size and/or use of iterative reconstruction technique. COMPARISON:  04/16/2023 CT head 10:48 a.m., 04/16/2023 IR CT head FINDINGS: Brain: Hyperdense material area around the left M1, which correlates with area of hypodensity on the prior CT head. No evidence  of additional acute infarct, mass, mass effect, or midline shift. No hydrocephalus. Gray-white differentiation is preserved. The basilar cisterns are patent. Vascular: Status post interval placement of a left M1 stent. Some contrast is noted within the vasculature. Skull: Negative for fracture or focal  lesion. Sinuses/Orbits: Mucosal thickening in the sphenoid sinuses and ethmoid air cells. No acute finding in the orbits. Other: The mastoid air cells are well aerated. IMPRESSION: 1. Hyperdense material around the left M1, which correlates with area of hypodensity on the prior CT head, favored to represent a combination of petechial hemorrhage and contrast staining within the area of prior infarct. Heidelberg classification 1b: HI2, confluent petechiae, no mass effect. 2. Status post interval placement of a left M1 stent. Electronically Signed   By: Wiliam Ke M.D.   On: 04/16/2023 18:47   DG Abd 1 View Result Date: 04/16/2023 CLINICAL DATA:  Feeding tube placement EXAM: ABDOMEN - 1 VIEW COMPARISON:  04/14/2023 FINDINGS: No feeding tube is visible projecting over the abdomen. However, there is a nasogastric tube with tip in the stomach body. Contrast medium noted in the renal collecting systems with faint visibility of the renal parenchyma as well. Given the renal parenchymal persistent nephrogram, the possibility of renal dysfunction might be considered. IMPRESSION: 1. Nasogastric tube with tip in the stomach body. 2. Contrast medium in the renal collecting systems with faint visibility of the renal parenchyma as well. Given the renal parenchymal persistent nephrogram, the possibility of renal dysfunction might be considered. Electronically Signed   By: Gaylyn Rong M.D.   On: 04/16/2023 18:17   Portable Chest x-ray Result Date: 04/16/2023 CLINICAL DATA:  Intubation. EXAM: PORTABLE CHEST 1 VIEW COMPARISON:  Radiographs 12/05/2022.  Cardiac CT 12/20/2016. FINDINGS: 1613 hours. Tip of the endotracheal tube overlies the mid trachea. Enteric tube projects just below the diaphragm with the side hole above the expected location of the gastroesophageal junction. The heart size and mediastinal contours are normal. The lungs appear clear. There is no pleural effusion or pneumothorax. The bones appear  unremarkable. IMPRESSION: 1. Satisfactory position of endotracheal tube. 2. Enteric tube side hole above the expected location of the gastroesophageal junction. Recommend advancement. 3. No evidence of acute cardiopulmonary process. Electronically Signed   By: Carey Bullocks M.D.   On: 04/16/2023 16:36   CT ANGIO HEAD NECK W WO CM (CODE STROKE) Result Date: 04/16/2023 CLINICAL DATA:  Neuro deficit, acute, stroke suspected. Worsening aphasia, recent near occlusive L M1 on CTA. EXAM: CT ANGIOGRAPHY HEAD AND NECK WITH AND WITHOUT CONTRAST TECHNIQUE: Multidetector CT imaging of the head and neck was performed using the standard protocol during bolus administration of intravenous contrast. Multiplanar CT image reconstructions and MIPs were obtained to evaluate the vascular anatomy. Carotid stenosis measurements (when applicable) are obtained utilizing NASCET criteria, using the distal internal carotid diameter as the denominator. RADIATION DOSE REDUCTION: This exam was performed according to the departmental dose-optimization program which includes automated exposure control, adjustment of the mA and/or kV according to patient size and/or use of iterative reconstruction technique. CONTRAST:  75mL OMNIPAQUE IOHEXOL 350 MG/ML SOLN COMPARISON:  CTA head and neck 04/12/2023 FINDINGS: CTA NECK FINDINGS Aortic arch: Normal variant aortic arch branching pattern with common origin of the brachiocephalic and left common carotid arteries. Widely patent arch vessel origins. Right carotid system: Patent without evidence a significant stenosis or dissection. Left carotid system: Patent without evidence of a significant stenosis or dissection. Mild atheromatous irregularity of the carotid bulb. Vertebral arteries: Patent without evidence  of a significant stenosis or dissection. Dominant right vertebral artery. Skeleton: Mild-to-moderate cervical spondylosis. Other neck: No evidence of cervical lymphadenopathy or mass. Upper chest:  Clear lung apices. Review of the MIP images confirms the above findings CTA HEAD FINDINGS Anterior circulation: The internal carotid arteries are widely patent from skull base to carotid termini. There is occlusion of the proximal left M1 segment with worsening, now only minimal collateralization of left MCA branch vessels. The right MCA and both ACAs are patent. A moderate proximal right M1 stenosis is more prominent than on the prior. No aneurysm is identified. Posterior circulation: The intracranial vertebral arteries are widely patent to the basilar. Patent PICA, AICA, and SCA origins are visualized bilaterally. The basilar artery is widely patent. The basilar artery is crash that posterior communicating arteries are diminutive or absent. Both PCAs are patent without evidence of a significant proximal stenosis. No aneurysm is identified. Venous sinuses: Poorly evaluated due to contrast timing. Anatomic variants: None. Review of the MIP images confirms the above findings Emergent findings were communicated by telephone to Dr. Selina Cooley on 04/16/2023 at 11:34 a.m. IMPRESSION: 1. Proximal left M1 occlusion with only minimal collateralization of left MCA branch vessels, progressed from 04/12/2023. 2. Moderate proximal right M1 stenosis. 3. Widely patent cervical carotid and vertebral arteries. Electronically Signed   By: Sebastian Ache M.D.   On: 04/16/2023 11:47   CT HEAD CODE STROKE WO CONTRAST` Result Date: 04/16/2023 CLINICAL DATA:  Code stroke.  Delirium. EXAM: CT HEAD WITHOUT CONTRAST TECHNIQUE: Contiguous axial images were obtained from the base of the skull through the vertex without intravenous contrast. RADIATION DOSE REDUCTION: This exam was performed according to the departmental dose-optimization program which includes automated exposure control, adjustment of the mA and/or kV according to patient size and/or use of iterative reconstruction technique. COMPARISON:  CT head without contrast 04/12/2023. MR  head without contrast 04/12/2023. FINDINGS: Brain: Expected evolution of recently seen ischemic infarcts in the left MCA territory noted. No definite new infarcts are present. No significant hemorrhage is present. There is some effacement of the sulci without midline shift or significant mass effect on the ventricle. The deep gray nuclei are intact. The brainstem and cerebellum are within normal limits. Midline structures are within normal limits. Vascular: No hyperdense vessel or unexpected calcification. Skull: Calvarium is intact. No focal lytic or blastic lesions are present. No significant extracranial soft tissue lesion is present. Sinuses/Orbits: The paranasal sinuses and mastoid air cells are clear. ASPECTS [facility] Stroke Program Early CT Score) - Ganglionic level infarction (caudate, lentiform nuclei, internal capsule, insula, M1-M3 cortex): 7/7 - Supraganglionic infarction (M4-M6 cortex): 3/3 Total score (0-10 with 10 being normal): 10/10 IMPRESSION: 1. Expected evolution of recently seen ischemic infarcts in the left MCA territory. 2. No definite new infarcts are present. 3. No significant hemorrhage. 4. Aspects is 10/10. The above was relayed via text pager to Dr. Selina Cooley on 04/16/2023 at 11:01 . Electronically Signed   By: Marin Roberts M.D.   On: 04/16/2023 11:01   DG Abd Portable 1V Result Date: 04/14/2023 CLINICAL DATA:  Abdominal pain. EXAM: PORTABLE ABDOMEN - 1 VIEW COMPARISON:  CT 05/30/2018 FINDINGS: No bowel dilatation to suggest obstruction. Air within prominent but not abnormally dilated transverse and descending colon. Formed stool in the ascending colon. No abnormal rectal distention. Pelvic phleboliths. No abdominal radiopaque calculi. On limited assessment, no acute osseous findings. IMPRESSION: Normal bowel gas pattern.  No explanation for pain. Electronically Signed   By: Ivette Loyal.D.  On: 04/14/2023 15:03    Labs:  CBC: Recent Labs    04/12/23 1255  04/13/23 0642 04/16/23 1633 04/16/23 1706 04/17/23 0456  WBC 6.7 5.7  --  7.9 8.1  HGB 15.0 13.3 14.3 13.6 11.8*  HCT 45.2 39.3 42.0 39.6 36.0  PLT 176 158  --  179 164    COAGS: Recent Labs    04/12/23 1255  INR 1.0    BMP: Recent Labs    04/12/23 1255 04/13/23 0642 04/16/23 1633 04/16/23 1706 04/17/23 0456  NA 135 140 141 138 140  K 3.3* 3.8 3.4* 3.6 3.7  CL 101 109  --  110 111  CO2 20* 23  --  19* 19*  GLUCOSE 161* 98  --  126* 93  BUN 12 9  --  24* 21*  CALCIUM 9.3 8.9  --  8.2* 7.9*  CREATININE 1.11* 1.00  --  1.59* 1.24*  GFRNONAA 58* >60  --  38* 51*    LIVER FUNCTION TESTS: Recent Labs    07/05/22 1520 12/06/22 0154 12/07/22 0257 12/09/22 0327 04/12/23 1255  BILITOT 0.7 0.9 0.9  --  0.5  AST 18 20 20   --  25  ALT 11 15 12   --  15  ALKPHOS 149* 133* 116  --  123  PROT 7.0 7.7 6.8  --  8.2*  ALBUMIN 4.3 3.9 3.5 3.4* 4.3    Assessment and Plan: L MCA occlusion s/p angioplasty and stent placement achieving TICI 3 revascularization.  Patient assessed at bedside alongside Dr. Corliss Skains.  She is densely aphasic, but does follow most commands.  Minimal movement with right, but is able to draw up foot/bend knee.  Groin procedure site intact. No issues noted.  No DP bilaterally consistent with pre-procedure exam.  Feet warm, well-perfused.   Continue Brilinta 90mg  BID, aspirin 81mg  daily.    Electronically Signed: Hoyt Koch, PA 04/17/2023, 4:05 PM   I spent a total of 15 Minutes at the the patient's bedside AND on the patient's hospital floor or unit, greater than 50% of which was counseling/coordinating care for L MCA infarct.

## 2023-04-17 NOTE — Progress Notes (Addendum)
STROKE TEAM PROGRESS NOTE   BRIEF HPI [name] is a 58 y.o. female with PMH significant for previous left cerebellar stroke, hypertension, hyperlipidemia, peripheral neuropathy, multiple myeloma, IBS, GERD who presented to med [facility] 1/23 due to 1 to 2 weeks of word finding difficulty and dysarthria.  She was found to have left MCA territory stroke and severe left MCA stenosis.  Patient has a history of prior cerebellar stroke with minimal residual deficits.  Given the multiple days of symptoms patient was not a candidate for acute intervention at that time.  She was transferred to [facility] for further workup. 1/27, she was found to have acute onset of right gaze deviation, inability to follow commands and global aphasia.  CTA revealed proximal left M1 occlusion.  She was taken for mechanical thrombectomy after discussion with IR and consent being obtained from her son.   SIGNIFICANT HOSPITAL EVENTS 1/27: IR for L MCA M1 thrombectomy with revascularization via stent, TICI 3  INTERIM HISTORY/SUBJECTIVE  Family at bedside.  Exam, patient has purposeful movement on the left, no spontaneous movement on the right.  She continues to be aphasic, no blink to threat on the right.  Blood pressure goal less than 160.    PENDING repeat MRI Brain and MRA Head  OBJECTIVE  CBC    Component Value Date/Time   WBC 8.1 04/17/2023 0456   RBC 4.02 04/17/2023 0456   HGB 11.8 (L) 04/17/2023 0456   HGB 15.1 12/01/2021 1410   HCT 36.0 04/17/2023 0456   HCT 46.1 12/01/2021 1410   PLT 164 04/17/2023 0456   PLT 164 12/01/2021 1410   MCV 89.6 04/17/2023 0456   MCV 87 12/01/2021 1410   MCH 29.4 04/17/2023 0456   MCHC 32.8 04/17/2023 0456   RDW 15.3 04/17/2023 0456   RDW 14.4 12/01/2021 1410   LYMPHSABS 1.5 04/17/2023 0456   LYMPHSABS 2.4 02/04/2019 1450   MONOABS 0.7 04/17/2023 0456   EOSABS 0.0 04/17/2023 0456   EOSABS 0.1 02/04/2019 1450   BASOSABS 0.0 04/17/2023 0456    BASOSABS 0.0 02/04/2019 1450    BMET    Component Value Date/Time   NA 140 04/17/2023 0456   NA 147 (H) 12/01/2021 1410   K 3.7 04/17/2023 0456   CL 111 04/17/2023 0456   CO2 19 (L) 04/17/2023 0456   GLUCOSE 93 04/17/2023 0456   BUN 21 (H) 04/17/2023 0456   BUN 8 12/01/2021 1410   CREATININE 1.24 (H) 04/17/2023 0456   CALCIUM 7.9 (L) 04/17/2023 0456   EGFR 71 12/01/2021 1410   GFRNONAA 51 (L) 04/17/2023 0456    IMAGING past 24 hours CT HEAD WO CONTRAST ( ) Result Date: 04/16/2023 CLINICAL DATA:  Stroke, follow-up EXAM: CT HEAD WITHOUT CONTRAST TECHNIQUE: Contiguous axial images were obtained from the base of the skull through the vertex without intravenous contrast. RADIATION DOSE REDUCTION: This exam was performed according to the departmental dose-optimization program which includes automated exposure control, adjustment of the mA and/or kV according to patient size and/or use of iterative reconstruction technique. COMPARISON:  04/16/2023 CT head 10:48 a.m., 04/16/2023 IR CT head FINDINGS: Brain: Hyperdense material area around the left M1, which correlates with area of hypodensity on the prior CT head. No evidence of additional acute infarct, mass, mass effect, or midline shift. No hydrocephalus. Gray-white differentiation is preserved. The basilar cisterns are patent. Vascular: Status post interval placement of a left M1 stent. Some contrast is noted within the vasculature. Skull: Negative for  fracture or focal lesion. Sinuses/Orbits: Mucosal thickening in the sphenoid sinuses and ethmoid air cells. No acute finding in the orbits. Other: The mastoid air cells are well aerated. IMPRESSION: 1. Hyperdense material around the left M1, which correlates with area of hypodensity on the prior CT head, favored to represent a combination of petechial hemorrhage and contrast staining within the area of prior infarct. Heidelberg classification 1b: HI2, confluent petechiae, no mass effect. 2. Status  post interval placement of a left M1 stent. Electronically Signed   By: Wiliam Ke M.D.   On: 04/16/2023 18:47   DG Abd 1 View Result Date: 04/16/2023 CLINICAL DATA:  Feeding tube placement EXAM: ABDOMEN - 1 VIEW COMPARISON:  04/14/2023 FINDINGS: No feeding tube is visible projecting over the abdomen. However, there is a nasogastric tube with tip in the stomach body. Contrast medium noted in the renal collecting systems with faint visibility of the renal parenchyma as well. Given the renal parenchymal persistent nephrogram, the possibility of renal dysfunction might be considered. IMPRESSION: 1. Nasogastric tube with tip in the stomach body. 2. Contrast medium in the renal collecting systems with faint visibility of the renal parenchyma as well. Given the renal parenchymal persistent nephrogram, the possibility of renal dysfunction might be considered. Electronically Signed   By: Gaylyn Rong M.D.   On: 04/16/2023 18:17    Vitals:   04/17/23 1300 04/17/23 1400 04/17/23 1500 04/17/23 1600  BP: 122/64 130/79 (!) 122/56 132/60  Pulse: 97 (!) 102 (!) 103 (!) 112  Resp: (!) 24 16 18 14   Temp:      TempSrc:      SpO2: 97% 96% 96% 96%  Weight:      Height:         PHYSICAL EXAM General:  Alert, well-nourished, well-developed patient in no acute distress Psych:  Mood and affect appropriate for situation CV: Regular rate and rhythm on monitor Respiratory:  Regular, unlabored respirations on room air GI: Abdomen soft and nontender   NEURO:  Mental Status: Alert, is able to mimic commands only Speech/Language: Global aphasia present.  No verbal response.  Cranial Nerves:  II: PERRL.  No blink to threat on right III, IV, VI: EOMI. Eyelids elevate symmetrically.  V: Sensation is intact to light touch and symmetrical to face.  VII: Right facial droop VIII: hearing intact to voice. IX, X: Palate elevates symmetrically.  [id number] shrug 5/5. XII: tongue is midline without  fasciculations. Motor:  RUE/RLE 1/5, no effort against gravity Tone: is normal and bulk is normal Sensation- Intact to light touch bilaterally. Extinction absent to light touch to DSS.   Coordination: unable to perform  Gait- deferred  Most Recent NIH: 16    ASSESSMENT/PLAN  Acute Ischemic Infarct:  left MCA territory s/p mechanical thrombectomy with stent, TICI3 revascularization Etiology:  small vessel disease  1/27 Code Stroke CT head  Expected evolution of recently seen ischemic infarct left MCA territory No definite new infarcts present No significant hemorrhage  ASPECTS 10 CTA head & neck  Proximal left M1 occlusion Post IR CT: Small focal areas of contrast staining in previous subacute infarcts.  No gross intracranial hemorrhage evident. MRI  1/23 Scattered acute to early subacute ischemic infarcts left cerebral hemisphere, left MCA distribution. Few scattered superimposed late subacute to chronic left MCA distribution infarct Chronic left cerebellar infarct  Repeat MRI: PENDING MRA Head: PENDING  2D Echo: LVEF 60-65%, Moderately dilated left atria Likely recommend 30-day versus Loop Recorder on discharge  LDL 124  HgbA1c 5.9 VTE prophylaxis - lovenox No antithrombotic prior to admission, continue aspirin 81 mg daily and Brilinta (ticagrelor) 90 mg bid  Therapy recommendations:  CIR Disposition:  pending  Hypertension Home meds:  amlodipine 10mg , hydrochlorothiazide 12.5mg  Stable Blood Pressure Goal: SBP 120-160 for first 24 hours then less than 180   Hyperlipidemia Home meds:  Lipitor 80mg  LDL 124, goal < 70 Add Zetia  Continue Zetia and statin at discharge  Diabetes type II, Pre-Diabetic Home meds:  none HgbA1c 5.9, goal < 7.0 CBGs SSI Recommend close follow-up with PCP for better DM control  Tobacco Abuse Former cigarette smoker  Dysphagia Patient has post-stroke dysphagia, SLP consulted    Diet   DIET DYS 3 Room service appropriate? No;  Fluid consistency: Thin   Advance diet as tolerated  Other Stroke Risk Factors Obesity, Body mass index is 29.44 kg/m., BMI >/= 30 associated with increased stroke risk, recommend weight loss, diet and exercise as appropriate  Family hx stroke (maternal uncle)   Hospital day # 4   Pt seen by Neuro NP/APP and later by MD. Note/plan to be edited by MD as needed.    Lynnae January, DNP, AGACNP-BC [facility] Please use AMION for contact information & EPIC for messaging.  STROKE MD NOTE :  I have personally obtained history,examined this patient, reviewed notes, independently viewed imaging studies, participated in medical decision making and plan of care.ROS completed by me personally and pertinent positives fully documented  I have made any additions or clarifications directly to the above note. Agree with note above.  Patient presented with 1 to 2-week history of word finding difficulties and dysarthria and was found to have severe left MCA stenosis and on initial admission angioplasty stenting of the left MCA was considered but felt to be too high risk due to risk of hemorrhage and intervention was not done.  She had neurological worsening yesterday with change in NIH stroke scale from 4-9 with CT angiogram showing complete proximal left M1 occlusion and after discussion of risk-benefit and hemorrhage risk with the family patient was taken for elective intervention and underwent complete revascularization of the occluded left M1 segment followed by balloon angioplasty and stenting.  Post procedure CT scan demonstrated focal area of contrast staining from previous subacute infarcts and known loss in nature.  Patient remains aphasic with dense right hemiplegia.  Recommend close neurological observation and strict blood pressure control as per post intervention protocol.  Check MRI scan and MRI of the brain later. Long discussion with family at the bedside and answered questions. This  patient is critically ill and at significant risk of neurological worsening, death and care requires constant monitoring of vital signs, hemodynamics,respiratory and cardiac monitoring, extensive review of multiple databases, frequent neurological assessment, discussion with family, other specialists and medical decision making of high complexity.I have made any additions or clarifications directly to the above note.This critical care time does not reflect procedure time, or teaching time or supervisory time of PA/NP/Med Resident etc but could involve care discussion time.  I spent 30 minutes of neurocritical care time  in the care of  this patient.     Delia Heady, MD Medical Director [facility] Pager: [phone] 04/17/2023 4:59 PM   To contact Stroke Continuity provider, please refer to [email or web address]. After hours, contact General Neurology

## 2023-04-17 NOTE — Evaluation (Addendum)
Physical Therapy Evaluation Patient Details Name: Brittany [name] MRN: [id number] DOB: [birth year] Today's Date: 04/17/2023  History of Present Illness  58 yo female presents to ED on 1/23 with AMS, slurred speech. MRI 1/23 revealed scattered acute to early subacute ischemic infarcts involving L cerebral hemisphere, L MCA distribution. Rapid response called 1/27 for AMS, R weakness; s/p L arteriogram via R CFA approach for occluded L M1 segment on 1/27. ETT 1/27-1/28. PHMx: Anemia, arthritis, elevated cholesterol, GERD, heart murmur, female bladder prolapse, IBS, hiatal hernia, stroke, multiple myeloma, SOB, and peripheral neuropathy.  Clinical Impression   PT re-evaluated pt s/p occlusion M1 segment and revascularization on 1/27. Pt presents with R inattention, R hemiparesis and impaired sensation, poor balance, and decreased activity tolerance. Pt to benefit from acute PT to address deficits. Pt requiring mod +2 assist for bed mobility and transfer to/from [facility], pt with difficulty with RLE stability in stance and requires assist for RLE translation to get to/from [facility]. Pt with inconsistent yes/no response via head nods this date, and is very frustrated given expressive difficulties. Patient will benefit from intensive inpatient follow-up therapy, >3 hours/day.  PT to progress mobility as tolerated, and will continue to follow acutely.          If plan is discharge home, recommend the following: Assistance with cooking/housework;Direct supervision/assist for medications management;Direct supervision/assist for financial management;Assist for transportation;Help with stairs or ramp for entrance;Supervision due to cognitive status;A lot of help with walking and/or transfers;A lot of help with bathing/dressing/bathroom   Can travel by private vehicle        Equipment Recommendations None recommended by PT  Recommendations for Other Services       Functional Status Assessment Patient has had  a recent decline in their functional status and demonstrates the ability to make significant improvements in function in a reasonable and predictable amount of time.     Precautions / Restrictions Precautions Precautions: Fall Precaution Comments: R inattention, R hemiparesis Restrictions Weight Bearing Restrictions Per Provider Order: No      Mobility  Bed Mobility Overal bed mobility: Needs Assistance Bed Mobility: Supine to Sit, Sit to Supine     Supine to sit: Mod assist Sit to supine: Mod assist, +2 for physical assistance   General bed mobility comments: assist for trunk and LE management, especially attention to RUE and LE and LE translation. Increased time, step-wise sequencing cues    Transfers Overall transfer level: Needs assistance Equipment used: 1 person hand held assist, 2 person hand held assist Transfers: Sit to/from Stand, Bed to chair/wheelchair/BSC Sit to Stand: Max assist Stand pivot transfers: Mod assist, +2 physical assistance         General transfer comment: assist for power up, rise, steady, hip extension to full upright, RLE blocking during stance and RLE progression when taking pivotal steps. Transfer to/from [facility]    Ambulation/Gait                  Stairs            Wheelchair Mobility     Tilt Bed    Modified Rankin (Stroke Patients Only) Modified Rankin (Stroke Patients Only) Pre-Morbid Rankin Score: No significant disability Modified Rankin: Severe disability     Balance Overall balance assessment: Needs assistance Sitting-balance support: No upper extremity supported, Feet supported Sitting balance-Leahy Scale: Fair     Standing balance support: Bilateral upper extremity supported, During functional activity Standing balance-Leahy Scale: Poor  Pertinent Vitals/Pain Pain Assessment Pain Assessment: Faces Faces Pain Scale: Hurts little more Pain Location: abdomen  (indicates "yes" she needs to have BM) Pain Descriptors / Indicators: Grimacing, Guarding Pain Intervention(s): Monitored during session, Limited activity within patient's tolerance, Repositioned    Home Living Family/patient expects to be discharged to:: Private residence Living Arrangements: Alone Available Help at Discharge: Family;Personal care attendant;Available PRN/intermittently Type of Home: House Home Access: Level entry       Home Layout: One level Home Equipment: Rollator (4 wheels);BSC/3in1 Additional Comments: pt's son is in [place], elderly parents are in [place]. Pt has a PCA.    Prior Function Prior Level of Function : Independent/Modified Independent;Driving             Mobility Comments: drives, needs to use Rollator all the time due to pain in LE and decreased endurance (per mother) - however, pt states she uses a SPC for mobility. ADLs Comments: in home caregiver, 3 day/wk cooking, cleaning per mothers report     Extremity/Trunk Assessment   Upper Extremity Assessment Upper Extremity Assessment: Defer to OT evaluation RUE Deficits / Details: no active RUE movement, flaccid, no pain or light touch response    Lower Extremity Assessment RLE Deficits / Details: 1/5 hip flexors and knee extensors, otherwise no active motion noted. no pain or light touch response RLE Coordination: decreased gross motor;decreased fine motor    Cervical / Trunk Assessment Cervical / Trunk Assessment: Normal  Communication   Communication Communication: Difficulty communicating thoughts/reduced clarity of speech Cueing Techniques: Gestural cues;Verbal cues;Tactile cues  Cognition Arousal: Alert Behavior During Therapy: Impulsive Overall Cognitive Status: Difficult to assess                                 General Comments: Pt inconsistent with yes/no via verbalization or head nods, nods "yes" her name is Brittany [name]. Requires cues to wait for PT assist         General Comments General comments (skin integrity, edema, etc.): BP drop with initial transfer, but recovered [facility] on second attempt. Pt endorsing need to have BM, unable to during PT session and pt upset by this    Exercises     Assessment/Plan    PT Assessment Patient needs continued PT services  PT Problem List Decreased strength;Decreased activity tolerance;Decreased balance;Decreased cognition;Decreased safety awareness;Cardiopulmonary status limiting activity;Decreased mobility;Decreased coordination;Impaired tone       PT Treatment Interventions DME instruction;Gait training;Functional mobility training;Therapeutic activities;Therapeutic exercise;Balance training;Neuromuscular re-education;Cognitive remediation;Patient/family education;Stair training    PT Goals (Current goals can be found in the Care Plan section)  Acute Rehab PT Goals PT Goal Formulation: With patient Time For Goal Achievement: 05/01/23 Potential to Achieve Goals: Good    Frequency Min 1X/week     Co-evaluation               AM-PAC PT "6 Clicks" Mobility  Outcome Measure Help needed turning from your back to your side while in a flat bed without using bedrails?: A Little Help needed moving from lying on your back to sitting on the side of a flat bed without using bedrails?: A Lot Help needed moving to and from a bed to a chair (including a wheelchair)?: Total Help needed standing up from a chair using your arms (e.g., wheelchair or bedside chair)?: Total Help needed to walk in hospital room?: Total Help needed climbing 3-5 steps with a railing? : Total 6 Click Score: 9  End of Session Equipment Utilized During Treatment: Gait belt Activity Tolerance: Patient limited by fatigue Patient left: in bed;with call bell/phone within reach;with bed alarm set;with nursing/sitter in room Nurse Communication: Mobility status PT Visit Diagnosis: Muscle weakness (generalized) (M62.81);Difficulty in  walking, not elsewhere classified (R26.2);Other symptoms and signs involving the nervous system (R29.898);Dizziness and giddiness (R42);Unsteadiness on feet (R26.81)    Time: 9147-8295 PT Time Calculation (min) (ACUTE ONLY): 28 min   Charges:   PT Evaluation $PT Re-evaluation: 1 Re-eval   PT General Charges $$ ACUTE PT VISIT: 1 Visit         Marye Round, PT DPT Acute Rehabilitation Services Secure Chat Preferred  Office [phone]   Ryenn Howeth Sheliah Plane 04/17/2023, 3:57 PM

## 2023-04-17 NOTE — Procedures (Signed)
Extubation Procedure Note  Patient Details:   Name: Brittany [name] DOB: [birth year] MRN: [id number]   Airway Documentation:    Vent end date: 04/17/23 Vent end time: 1008   Evaluation  O2 sats: stable throughout Complications: No apparent complications Patient did tolerate procedure well. Bilateral Breath Sounds: Clear, Diminished   No  Pt extubated per MD order, placed on 3L [place]. Unable to speak due to aphasia. Positive cuff leak noted, no stridor heard. RT will continue to monitor.   Vicente Masson 04/17/2023, 10:10 AM

## 2023-04-18 DIAGNOSIS — N179 Acute kidney failure, unspecified: Secondary | ICD-10-CM

## 2023-04-18 DIAGNOSIS — E785 Hyperlipidemia, unspecified: Secondary | ICD-10-CM

## 2023-04-18 DIAGNOSIS — I69391 Dysphagia following cerebral infarction: Secondary | ICD-10-CM | POA: Diagnosis not present

## 2023-04-18 DIAGNOSIS — I10 Essential (primary) hypertension: Secondary | ICD-10-CM | POA: Diagnosis not present

## 2023-04-18 DIAGNOSIS — I639 Cerebral infarction, unspecified: Secondary | ICD-10-CM | POA: Diagnosis not present

## 2023-04-18 LAB — CBC
HCT: 33.1 % — ABNORMAL LOW (ref 36.0–46.0)
Hemoglobin: 11 g/dL — ABNORMAL LOW (ref 12.0–15.0)
MCH: 29.3 pg (ref 26.0–34.0)
MCHC: 33.2 g/dL (ref 30.0–36.0)
MCV: 88 fL (ref 80.0–100.0)
Platelets: 148 10*3/uL — ABNORMAL LOW (ref 150–400)
RBC: 3.76 MIL/uL — ABNORMAL LOW (ref 3.87–5.11)
RDW: 15.4 % (ref 11.5–15.5)
WBC: 7.2 10*3/uL (ref 4.0–10.5)
nRBC: 0 % (ref 0.0–0.2)

## 2023-04-18 LAB — GLUCOSE, CAPILLARY
Glucose-Capillary: 102 mg/dL — ABNORMAL HIGH (ref 70–99)
Glucose-Capillary: 103 mg/dL — ABNORMAL HIGH (ref 70–99)
Glucose-Capillary: 110 mg/dL — ABNORMAL HIGH (ref 70–99)
Glucose-Capillary: 115 mg/dL — ABNORMAL HIGH (ref 70–99)
Glucose-Capillary: 120 mg/dL — ABNORMAL HIGH (ref 70–99)
Glucose-Capillary: 88 mg/dL (ref 70–99)

## 2023-04-18 LAB — BASIC METABOLIC PANEL
Anion gap: 11 (ref 5–15)
BUN: 14 mg/dL (ref 6–20)
CO2: 18 mmol/L — ABNORMAL LOW (ref 22–32)
Calcium: 8.3 mg/dL — ABNORMAL LOW (ref 8.9–10.3)
Chloride: 108 mmol/L (ref 98–111)
Creatinine, Ser: 1.22 mg/dL — ABNORMAL HIGH (ref 0.44–1.00)
GFR, Estimated: 52 mL/min — ABNORMAL LOW (ref >60)
Glucose, Bld: 79 mg/dL (ref 70–99)
Potassium: 3.8 mmol/L (ref 3.5–5.1)
Sodium: 137 mmol/L (ref 135–145)

## 2023-04-18 LAB — MAGNESIUM: Magnesium: 2.1 mg/dL (ref 1.7–2.4)

## 2023-04-18 LAB — PHOSPHORUS: Phosphorus: 2.7 mg/dL (ref 2.5–4.6)

## 2023-04-18 MED ORDER — HYDRALAZINE HCL 50 MG PO TABS
50.0000 mg | ORAL_TABLET | Freq: Three times a day (TID) | ORAL | Status: DC
  Administered 2023-04-18 – 2023-04-20 (×7): 50 mg via ORAL
  Filled 2023-04-18 (×7): qty 1

## 2023-04-18 MED ORDER — GABAPENTIN 300 MG PO CAPS
300.0000 mg | ORAL_CAPSULE | Freq: Three times a day (TID) | ORAL | Status: DC
  Administered 2023-04-18 – 2023-04-20 (×8): 300 mg via ORAL
  Filled 2023-04-18 (×8): qty 1

## 2023-04-18 MED ORDER — SENNA 8.6 MG PO TABS
1.0000 | ORAL_TABLET | Freq: Two times a day (BID) | ORAL | Status: DC
  Administered 2023-04-18 – 2023-04-20 (×5): 8.6 mg via ORAL
  Filled 2023-04-18 (×5): qty 1

## 2023-04-18 MED ORDER — AMLODIPINE BESYLATE 5 MG PO TABS
10.0000 mg | ORAL_TABLET | Freq: Every day | ORAL | Status: DC
Start: 2023-04-18 — End: 2023-04-20
  Administered 2023-04-18 – 2023-04-20 (×3): 10 mg via ORAL
  Filled 2023-04-18: qty 1
  Filled 2023-04-18 (×2): qty 2

## 2023-04-18 MED ORDER — CALCIUM CARBONATE ANTACID 500 MG PO CHEW
2.0000 | CHEWABLE_TABLET | Freq: Two times a day (BID) | ORAL | Status: DC | PRN
  Administered 2023-04-20: 400 mg via ORAL
  Filled 2023-04-18: qty 2

## 2023-04-18 MED ORDER — PANTOPRAZOLE SODIUM 40 MG PO TBEC
40.0000 mg | DELAYED_RELEASE_TABLET | Freq: Every day | ORAL | Status: DC
  Administered 2023-04-18 – 2023-04-20 (×3): 40 mg via ORAL
  Filled 2023-04-18 (×3): qty 1

## 2023-04-18 MED ORDER — LABETALOL HCL 5 MG/ML IV SOLN
10.0000 mg | INTRAVENOUS | Status: DC | PRN
  Administered 2023-04-18 – 2023-04-19 (×3): 10 mg via INTRAVENOUS
  Filled 2023-04-18 (×4): qty 4

## 2023-04-18 MED ORDER — ATORVASTATIN CALCIUM 80 MG PO TABS
80.0000 mg | ORAL_TABLET | Freq: Every day | ORAL | Status: DC
  Administered 2023-04-18 – 2023-04-20 (×3): 80 mg via ORAL
  Filled 2023-04-18 (×3): qty 1

## 2023-04-18 MED ORDER — POLYETHYLENE GLYCOL 3350 17 G PO PACK
17.0000 g | PACK | Freq: Every day | ORAL | Status: DC
  Administered 2023-04-18 – 2023-04-20 (×3): 17 g via ORAL
  Filled 2023-04-18 (×3): qty 1

## 2023-04-18 MED ORDER — ACETAMINOPHEN 325 MG PO TABS
650.0000 mg | ORAL_TABLET | Freq: Four times a day (QID) | ORAL | Status: DC | PRN
  Administered 2023-04-20 (×3): 650 mg via ORAL
  Filled 2023-04-18 (×3): qty 2

## 2023-04-18 NOTE — Progress Notes (Signed)
Inpatient Rehab Admissions Coordinator:   Stopped by pt's room but she was sleeping soundly.  I left a voicemail for pt's father to discuss rehab recommendations and caregiver support.  Will also ask for MD assessment for tomorrow.   Estill Dooms, PT, DPT Admissions Coordinator [phone] 04/18/23  2:51 PM

## 2023-04-18 NOTE — TOC CAGE-AID Note (Signed)
Transition of Care [facility]) - CAGE-AID Screening   Patient Details  Name: Alexine Pilant MRN: [id number] Date of Birth: [birth year]  [facility] CM/SW Contact:    Mearl Latin, LCSW Phone Number: 04/18/2023, 3:06 PM   Clinical Narrative: Patient disoriented and not able to participate in screening at this time.    CAGE-AID Screening: Substance Abuse Screening unable to be completed due to: : Patient unable to participate

## 2023-04-18 NOTE — Progress Notes (Addendum)
Physical Therapy Treatment Patient Details Name: Brittany [name] MRN: [id number] DOB: 08/28/1965 Today's Date: 04/18/2023   History of Present Illness 58 yo female presents to ED on 1/23 with AMS, slurred speech. MRI 1/23 revealed scattered acute to early subacute ischemic infarcts involving L cerebral hemisphere, L MCA distribution. Rapid response called 1/27 for AMS, R weakness; s/p L arteriogram via R CFA approach for occluded L M1 segment on 1/27. ETT 1/27-1/28. PHMx: Anemia, arthritis, elevated cholesterol, GERD, heart murmur, female bladder prolapse, IBS, hiatal hernia, stroke, multiple myeloma, SOB, and peripheral neuropathy.    PT Comments  Pt making excellent progress towards her physical therapy goals and is motivated to participate. Pt continues with communication impairment, expressive > receptive with inconsistent yes/no responses. Potentially displays some apraxia too with cueing needed for ADL tasks such as toileting and washing hands at sink. Pt requiring min assist (+2 safety) for transfers and limited ambulation via HHA. Pt continues with right sided weakness, impaired standing balance, right inattention, gait abnormalities. Patient will benefit from intensive inpatient follow-up therapy, >3 hours/day. Suspect excellent progress given age, PLOF, motivation.     If plan is discharge home, recommend the following: Assistance with cooking/housework;Direct supervision/assist for medications management;Direct supervision/assist for financial management;Assist for transportation;Help with stairs or ramp for entrance;Supervision due to cognitive status;A lot of help with walking and/or transfers;A lot of help with bathing/dressing/bathroom   Can travel by private vehicle        Equipment Recommendations  None recommended by PT    Recommendations for Other Services       Precautions / Restrictions Precautions Precautions: Fall Precaution Comments: R inattention, SBP <  180 Restrictions Weight Bearing Restrictions Per Provider Order: No     Mobility  Bed Mobility Overal bed mobility: Needs Assistance Bed Mobility: Supine to Sit       Sit to supine: Contact guard assist   General bed mobility comments: CGA for safety, exiting towards left side of bed    Transfers Overall transfer level: Needs assistance Equipment used: 2 person hand held assist Transfers: Sit to/from Stand Sit to Stand: Min assist, +2 safety/equipment           General transfer comment: Light minA to rise up from edge of bed and initially steady    Ambulation/Gait Ambulation/Gait assistance: Min assist, +2 safety/equipment Gait Distance (Feet): 60 Feet Assistive device: 2 person hand held assist Gait Pattern/deviations: Step-through pattern, Decreased dorsiflexion - right, Decreased step length - right Gait velocity: decreased Gait velocity interpretation: <1.31 ft/sec, indicative of household ambulator   General Gait Details: Decreased R foot clearance, weight shift and step length. MinA + 2 for balance via HHA, no R knee buckle noted.   Stairs             Wheelchair Mobility     Tilt Bed    Modified Rankin (Stroke Patients Only) Modified Rankin (Stroke Patients Only) Pre-Morbid Rankin Score: No significant disability Modified Rankin: Moderately severe disability     Balance Overall balance assessment: Needs assistance Sitting-balance support: No upper extremity supported, Feet supported Sitting balance-Leahy Scale: Good     Standing balance support: Bilateral upper extremity supported, During functional activity Standing balance-Leahy Scale: Poor Standing balance comment: reliant on external support                            Cognition Arousal: Alert Behavior During Therapy: WFL for tasks assessed/performed Overall Cognitive Status: Difficult to assess  General Comments:  Inconsistent with yes/no responses, bright affect and smiling with achievement of walking today. Visibly frustrated when not able to get words out or communicate needs. Able to wash hands at sink but needed cueing for using soap and drying.        Exercises      General Comments        Pertinent Vitals/Pain Pain Assessment Pain Assessment: 0-10 Pain Score: 5  Pain Location: Pt pointing to pain scale at level "5" but unable to verbalize site of discomfort Pain Descriptors / Indicators: Grimacing, Guarding Pain Intervention(s): Monitored during session    Home Living                          Prior Function            PT Goals (current goals can now be found in the care plan section) Acute Rehab PT Goals Potential to Achieve Goals: Good Progress towards PT goals: Progressing toward goals    Frequency    Min 1X/week      PT Plan      Co-evaluation              AM-PAC PT "6 Clicks" Mobility   Outcome Measure  Help needed turning from your back to your side while in a flat bed without using bedrails?: A Little Help needed moving from lying on your back to sitting on the side of a flat bed without using bedrails?: A Little Help needed moving to and from a bed to a chair (including a wheelchair)?: A Little Help needed standing up from a chair using your arms (e.g., wheelchair or bedside chair)?: A Little Help needed to walk in hospital room?: A Little Help needed climbing 3-5 steps with a railing? : A Lot 6 Click Score: 17    End of Session Equipment Utilized During Treatment: Gait belt Activity Tolerance: Patient tolerated treatment well Patient left: with call bell/phone within reach;in chair;with chair alarm set Nurse Communication: Mobility status PT Visit Diagnosis: Muscle weakness (generalized) (M62.81);Difficulty in walking, not elsewhere classified (R26.2);Other symptoms and signs involving the nervous system (R29.898);Dizziness and giddiness  (R42);Unsteadiness on feet (R26.81)     Time: 3016-0109 PT Time Calculation (min) (ACUTE ONLY): 33 min  Charges:    $Therapeutic Activity: 23-37 mins PT General Charges $$ ACUTE PT VISIT: 1 Visit                     Lillia Pauls, PT, DPT Acute Rehabilitation Services Office [phone]    Norval Morton 04/18/2023, 10:27 AM

## 2023-04-18 NOTE — Progress Notes (Addendum)
STROKE TEAM PROGRESS NOTE   BRIEF HPI [name] is a 58 y.o. female with PMH significant for previous left cerebellar stroke, hypertension, hyperlipidemia, peripheral neuropathy, multiple myeloma, IBS, GERD who presented to med [facility] 1/23 due to 1 to 2 weeks of word finding difficulty and dysarthria.  She was found to have left MCA territory stroke and severe left MCA stenosis.  Patient has a history of prior cerebellar stroke with minimal residual deficits.  Given the multiple days of symptoms patient was not a candidate for acute intervention at that time.  She was transferred to [facility] for further workup. 1/27, she was found to have acute onset of right gaze deviation, inability to follow commands and global aphasia.  CTA revealed proximal left M1 occlusion.  She was taken for mechanical thrombectomy after discussion with IR and consent being obtained from her son.   SIGNIFICANT HOSPITAL EVENTS 1/27: IR for L MCA M1 thrombectomy with revascularization via stent, TICI 3  INTERIM HISTORY/SUBJECTIVE No family at the bedside.  RN at the bedside  Patient is sleeping but arouses easily opens eyes.  Remains aphasic, can mimic, right facial droop.  Right arm and leg have improved since yesterday she is able to hold arm and leg when gravity is eliminated up for a few seconds however drifts to the bed  MRI/MRA brain with acute left MCA infarct with intraparenchymal hemorrhage centered in the left basal ganglia.  Left M1 is now patent, no LVO  Vitals have been stable.  Will transfer her to the floor  OBJECTIVE  CBC    Component Value Date/Time   WBC 7.2 04/18/2023 0610   RBC 3.76 (L) 04/18/2023 0610   HGB 11.0 (L) 04/18/2023 0610   HGB 15.1 12/01/2021 1410   HCT 33.1 (L) 04/18/2023 0610   HCT 46.1 12/01/2021 1410   PLT 148 (L) 04/18/2023 0610   PLT 164 12/01/2021 1410   MCV 88.0 04/18/2023 0610   MCV 87 12/01/2021 1410   MCH 29.3 04/18/2023 0610   MCHC 33.2  04/18/2023 0610   RDW 15.4 04/18/2023 0610   RDW 14.4 12/01/2021 1410   LYMPHSABS 1.5 04/17/2023 0456   LYMPHSABS 2.4 02/04/2019 1450   MONOABS 0.7 04/17/2023 0456   EOSABS 0.0 04/17/2023 0456   EOSABS 0.1 02/04/2019 1450   BASOSABS 0.0 04/17/2023 0456   BASOSABS 0.0 02/04/2019 1450    BMET    Component Value Date/Time   NA 137 04/18/2023 0610   NA 147 (H) 12/01/2021 1410   K 3.8 04/18/2023 0610   CL 108 04/18/2023 0610   CO2 18 (L) 04/18/2023 0610   GLUCOSE 79 04/18/2023 0610   BUN 14 04/18/2023 0610   BUN 8 12/01/2021 1410   CREATININE 1.22 (H) 04/18/2023 0610   CALCIUM 8.3 (L) 04/18/2023 0610   EGFR 71 12/01/2021 1410   GFRNONAA 52 (L) 04/18/2023 0610    IMAGING past 24 hours MR BRAIN WO CONTRAST Result Date: 04/18/2023 CLINICAL DATA:  Stroke, follow up; Neuro deficit, acute, stroke suspected EXAM: MRI HEAD WITHOUT CONTRAST MRA HEAD WITHOUT CONTRAST TECHNIQUE: Multiplanar, multi-echo pulse sequences of the brain and surrounding structures were acquired without intravenous contrast. Angiographic images of the Circle of Willis were acquired using MRA technique without intravenous contrast. COMPARISON:  CT head 04/16/2023. CTA head/neck 04/16/2023. FINDINGS: MRI HEAD FINDINGS Brain: Acute left MCA territory infarct which involves the left basal ganglia, overlying left frontal lobe, left parietal lobe and anterior left temporal lobe. Intraparenchymal hemorrhage  centered in the left basal ganglia appears similar when comparing across modalities to recent CT head. Similar mild rightward midline shift. No visible mass lesion. No hydrocephalus. Remote left cerebellar infarcts. Vascular: See below. Skull and upper cervical spine: Normal marrow signal. Sinuses/Orbits: Mild paranasal sinus mucosal thickening. No acute orbital findings. Other: No mastoid effusions. MRA HEAD FINDINGS Anterior circulation: Bilateral intracranial ICAs, MCAs and ACAs are patent without proximal hemodynamically  significant stenosis. There is a stent in the left M1 MCA which obscures the vessel in this region. No aneurysm identified. Posterior circulation: Bilateral intradural vertebral arteries, basilar artery and bilateral posterior cerebral arteries are patent without proximal hemodynamically significant stenosis. No aneurysm identified. IMPRESSION: 1. Acute left MCA territory infarct with similar intraparenchymal hemorrhage centered in the left basal ganglia when comparing across modalities to recent CT head. Similar mild rightward midline shift. 2. Left M1 MCA is now patent.  No large vessel occlusion. Electronically Signed   By: Feliberto Harts M.D.   On: 04/18/2023 01:28   MR ANGIO HEAD WO CONTRAST Result Date: 04/18/2023 CLINICAL DATA:  Stroke, follow up; Neuro deficit, acute, stroke suspected EXAM: MRI HEAD WITHOUT CONTRAST MRA HEAD WITHOUT CONTRAST TECHNIQUE: Multiplanar, multi-echo pulse sequences of the brain and surrounding structures were acquired without intravenous contrast. Angiographic images of the Circle of Willis were acquired using MRA technique without intravenous contrast. COMPARISON:  CT head 04/16/2023. CTA head/neck 04/16/2023. FINDINGS: MRI HEAD FINDINGS Brain: Acute left MCA territory infarct which involves the left basal ganglia, overlying left frontal lobe, left parietal lobe and anterior left temporal lobe. Intraparenchymal hemorrhage centered in the left basal ganglia appears similar when comparing across modalities to recent CT head. Similar mild rightward midline shift. No visible mass lesion. No hydrocephalus. Remote left cerebellar infarcts. Vascular: See below. Skull and upper cervical spine: Normal marrow signal. Sinuses/Orbits: Mild paranasal sinus mucosal thickening. No acute orbital findings. Other: No mastoid effusions. MRA HEAD FINDINGS Anterior circulation: Bilateral intracranial ICAs, MCAs and ACAs are patent without proximal hemodynamically significant stenosis. There is a  stent in the left M1 MCA which obscures the vessel in this region. No aneurysm identified. Posterior circulation: Bilateral intradural vertebral arteries, basilar artery and bilateral posterior cerebral arteries are patent without proximal hemodynamically significant stenosis. No aneurysm identified. IMPRESSION: 1. Acute left MCA territory infarct with similar intraparenchymal hemorrhage centered in the left basal ganglia when comparing across modalities to recent CT head. Similar mild rightward midline shift. 2. Left M1 MCA is now patent.  No large vessel occlusion. Electronically Signed   By: Feliberto Harts M.D.   On: 04/18/2023 01:28   DG Abd Portable 1V Result Date: 04/17/2023 CLINICAL DATA:  Bowel impaction EXAM: PORTABLE ABDOMEN - 1 VIEW COMPARISON:  04/16/2023 FINDINGS: The patient is rotated to the right on today's radiograph, reducing diagnostic sensitivity and specificity. Previous nasogastric tube no longer visible along the abdomen. No visible feeding tube. There is scattered gas in the colon without a substantial burden of colonic stool. No dilated small bowel is identified. IMPRESSION: 1. No substantial burden of colonic stool is identified. 2. Previous nasogastric tube no longer visible along the abdomen. Electronically Signed   By: Gaylyn Rong M.D.   On: 04/17/2023 18:43    Vitals:   04/18/23 0800 04/18/23 0900 04/18/23 1000 04/18/23 1100  BP: (!) 143/57 124/68    Pulse: (!) 105 (!) 101 (!) 106 97  Resp: (!) 26 19 (!) 21 19  Temp: 99.5 F (37.5 C)     TempSrc:  Axillary     SpO2: 95% 97% 99% 98%  Weight:      Height:         PHYSICAL EXAM General:  Alert, well-nourished, well-developed patient in no acute distress Psych:  Mood and affect appropriate for situation CV: Regular rate and rhythm on monitor Respiratory:  Regular, unlabored respirations on room air GI: Abdomen soft and nontender   NEURO:  Mental Status: Alert, is able to mimic commands  only Speech/Language: Global aphasia present.  No verbal response.  Cranial Nerves:  II: PERRL.  No blink to threat on right III, IV, VI: EOMI. Eyelids elevate symmetrically.  V: Sensation is intact to light touch and symmetrical to face.  VII: Right facial droop VIII: hearing intact to voice. IX, X: Palate elevates symmetrically.  [id number] shrug 5/5. XII: tongue is midline without fasciculations. Motor:  RUE/RLE 1/5, gravity eliminated she is able to hold arm and leg off the bed, arm hits the bed before 10 seconds, leg is able to hold for 5 seconds Tone: is normal and bulk is normal Sensation- Intact to light touch bilaterally. Extinction absent to light touch to DSS.   Coordination: unable to perform  Gait- deferred  Most Recent NIH: 17    ASSESSMENT/PLAN  Acute Ischemic Infarct:  left MCA territory due to left MCA occlusion with underlying stenosis s/p mechanical thrombectomy with rescue stent, TICI3 revascularization Etiology:  small vessel disease  1/27 Code Stroke CT head  Expected evolution of recently seen ischemic infarct left MCA territory No definite new infarcts present No significant hemorrhage  ASPECTS 10 CTA head & neck  Proximal left M1 occlusion Post IR CT: Small focal areas of contrast staining in previous subacute infarcts.  No gross intracranial hemorrhage evident. MRI  1/23 Scattered acute to early subacute ischemic infarcts left cerebral hemisphere, left MCA distribution. Few scattered superimposed late subacute to chronic left MCA distribution infarct Chronic left cerebellar infarct  Repeat MRI: acute left MCA infarct with intraparenchymal hemorrhage centered in the left basal ganglia.  MRA Head: Left M1 is patent  2D Echo: LVEF 60-65%, Moderately dilated left atria Likely recommend 30-day versus Loop Recorder on discharge  LDL 124 HgbA1c 5.9 VTE prophylaxis - lovenox No antithrombotic prior to admission, continue aspirin 81 mg daily and  Brilinta (ticagrelor) 90 mg bid  Therapy recommendations:  CIR Disposition:  pending  Hypertension Home meds:  amlodipine 10mg , hydrochlorothiazide 12.5mg  Stable Blood Pressure Goal: SBP 120-160 for first 24 hours then less than 180   Hyperlipidemia Home meds:  Lipitor 80mg  LDL 124, goal < 70 Add Zetia  Continue Zetia and statin at discharge  Diabetes type II, Pre-Diabetic Home meds:  none HgbA1c 5.9, goal < 7.0 CBGs SSI Recommend close follow-up with PCP for better DM control  Tobacco Abuse Former cigarette smoker  Dysphagia Patient has post-stroke dysphagia, SLP consulted    Diet   DIET DYS 3 Room service appropriate? No; Fluid consistency: Thin   Advance diet as tolerated  Other Stroke Risk Factors Obesity, Body mass index is 29.44 kg/m., BMI >/= 30 associated with increased stroke risk, recommend weight loss, diet and exercise as appropriate  Family hx stroke (maternal uncle)   Hospital day # 5   Pt seen by Neuro NP/APP and later by MD. Note/plan to be edited by MD as needed.     Gevena Mart DNP, ACNPC-AG  [facility]  I have personally obtained history,examined this patient, reviewed notes, independently viewed imaging studies, participated in medical decision making  and plan of care.ROS completed by me personally and pertinent positives fully documented  I have made any additions or clarifications directly to the above note. Agree with note above.  Patient is showing some improvement but still has significant aphasia and right hemiparesis.  Continue aspirin and Brilinta.  Speech therapy for swallow eval.  Mobilize out of bed.  Physical occupational speech therapy consults.  Transfer out of ICU.  Likely need inpatient rehab.  Long discussion with patient and family members at the bedside and answered questions.  Inpatient rehab consult.  Greater than 50% time during this 50-minute visit was spent in counseling and coordination of care and discussion  with patient and family and care team and answering questions.  Delia Heady, MD Medical Director [facility] Pager: [phone] 04/18/2023 2:58 PM    To contact Stroke Continuity provider, please refer to [email or web address]. After hours, contact General Neurology

## 2023-04-18 NOTE — Progress Notes (Signed)
NAME:  Brittany [name], MRN:  [id number], DOB:  [birth year], LOS: 5 ADMISSION DATE:  04/12/2023, CONSULTATION DATE:  04/16/23 REFERRING MD:  deveshwar, CHIEF COMPLAINT:  left MCA stroke s/p angioplasty and stent   History of Present Illness:  The patient is a 58 year old female who presented to [facility] 04[facility]12/23 with complaints of cognitive decline and slurred speech. 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 At baseline she walks with a cane and is able to live alone and complete ADLs independently. Over the last 2 weeks her mother has noticed slurred speech, confusion, and an inability to find words or identify objects. These symptoms had gotten worse over the last 2 days before admission.  CT [place] showed subacute strokes in the left MCA territory with possible short segment occlusion and distal MCA branches. She was then transferred to [facility]. ED labs were remarkable for potassium 3.3, Creatinine 1.11, THC positive. She was hemodynamically stable and alert at that time. MRI showed scattered acute to early subacute ischemic infarcts involving the left cerebral hemisphere. She was outside the window for invasive intervention or lytics.   Since admission she has been started on aspirin, plavix, and lipitor. Her aphasia was improving. Today (1/27) she had sudden onset confusion with right sided weakness, facial droop, and left sided gaze. Code stroke was activated. The patient was taken to IR found to have complete MCA occlusion. Underwent angioplasty and left MCA stent successfully placed. Critical care was consulted for post procedure care.   Pertinent  Medical History  Anemia, Arthritis, GERD, HTN, Stroke in 2017 with minimal deficits. Recent scattered left hemisphere strokes (this admission, 1/23)  Significant Hospital Events: Including procedures, antibiotic start and stop dates in addition to other pertinent events   1/23 admitted to  medicine for subacute stroke. CT/MRI with subacute left hemisphere strokes. Out of window for intervention 1/27 code stroke with MCA stent placed by IR and ccm consulted  1/28 Patient following commands, weaning, plan to extubate  1/29: NAEON, aphasic but pleasant. MRI overnight does show an ICH at site with previous concern for contrast leak. PCCM to sign off today   Interim History / Subjective:  Awake and alert, NAEON. No change in neuro exam. Continues to have about 1/5 strength on the right, aphasia, mild right facial droop. Appropriate to sign off at this time. Stroke to decide when appropriate to move out of unit.   Objective   Blood pressure (!) 150/76, pulse (!) 104, temperature 99 F (37.2 C), temperature source Oral, resp. rate 18, height 5\' 2"  (1.575 m), weight 73 kg, SpO2 96%.    Vent Mode: PSV;CPAP FiO2 (%):  [40 %] 40 % PEEP:  [5 cmH20] 5 cmH20 Pressure Support:  [8 cmH20] 8 cmH20   Intake/Output Summary (Last 24 hours) at 04/18/2023 0739 Last data filed at 04/17/2023 2100 Gross per 24 hour  Intake 999.8 ml  Output 400 ml  Net 599.8 ml   Filed Weights   04/12/23 2200  Weight: 73 kg    Examination: General: acute on chronically ill appearing middle aged female, laying in bed, NAD HEENT: Cataract/AT, anicteric sclera, mmm, right facial droop  Pulm: room air, respirations even and unlabored, no wheezing, rhonchi, rales Cards: s1s2, rrr, no murmur, rub, gallop, perfusing skin warm and dry, no edema Abs: rounded, non-distended, +BS Skin: no skin lesions, dry  Extremities: no edema, no acute deformities Neuro: awake, alert. Intermittently nods/shakes head  appropriately to questions, expressive aphasia. Right facial droop. RUE/RLE 1/5 strength, spontaneous 5/5 strength LUE/LLL.  GU: no foley   Resolved Hospital Problem list   AKI Abdominal pain/constipation Hypokalemia  Post op vent management  Assessment & Plan:  Acute Left MCA stroke s/p MCA stent 1/27 superimposed on  subacute scattered left hemisphere strokes left MCA stenosis Presented to the ED with subacute left hemisphere strokes manifesting aphasia and confusion.  Follow up CT scan 1836 1/27 hyperdense material near left M1, correlating with hypodense area on prior CT, petechial hemorrhage vs contrast staining with no mass effect, s/p left M1 stent. MRI 1/28 overnight confirming acute left MCA infarct, IPH in left basal ganglia, mild rightward midline shift  - neuro IR, stroke primary, appreicate management  - con't DAPT with asa 81mg , brilinta 90mg  BID,  - neuroprotective measures- euglycemia, normothermia, normoxia, normocapnia  Hypertension BP goal to 120-160 SBP to ensure adequate perfusion to lower extremities, issues 1/27 overnight obtaining post tib and DP even with doppler, but improving now. Stroke aware. Cleviprex off  - con't SBP goal 120-114mmHg - amlodipine 10mg  daily  - con't hydralazine 50mg  q8h  - PRN labetalol 10mg   - still holding losartan/hydrochlorothiazide with resolving aki. However, BP okay with the above two medications without needing labetalol   AKI, resolving Improved prior to 1/27 with Cr 1.59 to 1.22 - trend bmp, mag, phos - replete elytes - Avoid nephrotoxic agents, renally dose medications - ensure adequate renal perfusion   Hyperlipidemia - con't lipitor 80mg  daily  GERD - switch to PO pantoprazole  PCCM will sign off at this time. Thank you for letting us participate in care of patient. Please re-consult if needed.  Best Practice (right click and "Reselect all SmartList Selections" daily)  Per primary  Diet/type: dysphagia diet (see orders) DVT prophylaxis LMWH Pressure ulcer(s): N/A GI prophylaxis: PPI Lines: na Foley: na Code Status:  full code Last date of multidisciplinary goals of care discussion -family updated at beside on 1/28  Critical care time: 35 mins   Cristopher Peru, PA-C [facility] 04/18/23 9:25 AM  Please  see [email or web address] for pager details.  From 7A-7P if no response, please call [phone] After hours, please call ELink [phone]

## 2023-04-18 NOTE — Progress Notes (Signed)
Received from [facility] via stretcher. Oriented to room and surrounding.

## 2023-04-18 NOTE — Progress Notes (Signed)
Inpatient Rehab Admissions Coordinator:   At this time we are recommending a CIR consult and I will place an order per our protocol.   Estill Dooms, PT, DPT Admissions Coordinator [phone] 04/18/23  10:34 AM

## 2023-04-19 DIAGNOSIS — I6602 Occlusion and stenosis of left middle cerebral artery: Secondary | ICD-10-CM | POA: Diagnosis not present

## 2023-04-19 DIAGNOSIS — I69391 Dysphagia following cerebral infarction: Secondary | ICD-10-CM | POA: Diagnosis not present

## 2023-04-19 DIAGNOSIS — E785 Hyperlipidemia, unspecified: Secondary | ICD-10-CM | POA: Diagnosis not present

## 2023-04-19 DIAGNOSIS — M75101 Unspecified rotator cuff tear or rupture of right shoulder, not specified as traumatic: Secondary | ICD-10-CM

## 2023-04-19 DIAGNOSIS — I10 Essential (primary) hypertension: Secondary | ICD-10-CM | POA: Diagnosis not present

## 2023-04-19 DIAGNOSIS — I639 Cerebral infarction, unspecified: Secondary | ICD-10-CM | POA: Diagnosis not present

## 2023-04-19 LAB — GLUCOSE, CAPILLARY
Glucose-Capillary: 97 mg/dL (ref 70–99)
Glucose-Capillary: 110 mg/dL — ABNORMAL HIGH (ref 70–99)
Glucose-Capillary: 99 mg/dL (ref 70–99)
Glucose-Capillary: 125 mg/dL — ABNORMAL HIGH (ref 70–99)
Glucose-Capillary: 137 mg/dL — ABNORMAL HIGH (ref 70–99)

## 2023-04-19 NOTE — PMR Pre-admission (Signed)
PMR Admission Coordinator Pre-Admission Assessment  Patient: Brittany [name] is an 58 y.o., female MRN: [id number] DOB: [birth year] Height: 5\' 2"  (157.5 cm) Weight: 73 kg              Insurance Information HMO:     PPO:      PCP:      IPA:      80/20:      OTHER:  PRIMARY: [place] [facility]      Policy#: [id number]      Subscriber: pt CM Name: Burgess Amor     Phone#: [phone]     Fax#: [phone] Pre-Cert#: [id number]  auth for CIR  for 6 days from 04/20/23 with updates due on 04/25/23 to fax listed above.    Employer: Disabled Benefits:  Phone #: online     Name: online at [email or web address] Eff. Date: 09/18/22     Deduct: $0      Out of Pocket Max: $0      Life Max: n/a  CIR: 100%      SNF: 100% Outpatient: 100%     Co-Pay:  Home Health: 100%      Co-Pay: 100% DME: 100%     Co-Pay:  Providers: in network  SECONDARY:       Policy#:       Phone#:   Artist:       Phone#:   The Data processing manager" for patients in Inpatient Rehabilitation Facilities with attached "Privacy Act Statement-Health Care Records" was provided and verbally reviewed with: Patient and Family  Emergency Contact Information Contact Information     Name Relation Home Work [place] Father [phone]  [phone]   Baldwin [place] Mother [phone]     Lum Keas   [phone]      Other Contacts   None on File    Current Medical History  Patient Admitting Diagnosis: L CVA   History of Present Illness: Pt is a 58 y/o female with PMH of previous L cerebellar stroke, HTN, peripheral neuropathy, and multiple myeloma who presented to [facility] on 04/12/23 with c/o 1-2 week history of word finding deficits and dysarthria.  She was found to have subacute L MCA infarct due to severe L MCA stenosis at the time.  On 1/27 code stroke called due to AMS with right gaze deviation and increased aphasia.  CTA revealed proximal  left M1 occlusion and she underwent mechanical thrombectomy.  MRI showed infarct in the low left basal ganglia with intraparenchymal hemorrhage.  Therapy evaluations completed and pt was recommended for CIR.   Complete NIHSS TOTAL: 10 Glasgow Coma Scale Score: 11  Patient's medical record from [facility] has been reviewed by the rehabilitation admission coordinator and physician.  Past Medical History  Past Medical History:  Diagnosis Date   Anemia    Arthritis    trigger finger in left hand   Complication of anesthesia    per pt, hard to wake up!   Elevated cholesterol    Female bladder prolapse    per urologist, does not have prolaspe   GERD (gastroesophageal reflux disease)    Heart murmur    pt unsure.   Hiatal hernia    Hypertension    IBS (irritable bowel syndrome)    Multiple myeloma (HCC) 2005   had partial chemo   Peripheral neuropathy    SOB (shortness of breath) on exertion    uses an inhaler   Stroke (HCC) 2017  paralysis left arm/uses a walker    Has the patient had major surgery during 100 days prior to admission? Yes  Family History  family history includes Congestive Heart Failure in her father; Diverticulitis in her mother; Hypertension in her father and mother; Irritable bowel syndrome in her mother; Liver cancer in her mother; Sarcoidosis in her mother; Scoliosis in her brother; Stomach cancer in her father; Stroke in her maternal uncle.   Current Medications   Current Facility-Administered Medications:    acetaminophen (TYLENOL) tablet 650 mg, 650 mg, Oral, Q6H PRN, Violeta Gelinas, MD, 650 mg at 04/20/23 0944   amLODipine (NORVASC) tablet 10 mg, 10 mg, Oral, Daily, Violeta Gelinas, MD, 10 mg at 04/20/23 6045   aspirin chewable tablet 81 mg, 81 mg, Oral, Daily, 81 mg at 04/20/23 0924 **OR** [DISCONTINUED] aspirin chewable tablet 81 mg, 81 mg, Per Tube, Daily, Deveshwar, Simonne Maffucci, MD, 81 mg at 04/17/23 0932   atorvastatin (LIPITOR) tablet 80 mg, 80 mg,  Oral, Daily, Violeta Gelinas, MD, 80 mg at 04/20/23 4098   calcium carbonate (TUMS - dosed in mg elemental calcium) chewable tablet 400 mg of elemental calcium, 2 tablet, Oral, BID PRN, Gevena Mart A, NP, 400 mg of elemental calcium at 04/20/23 0920   Chlorhexidine Gluconate Cloth 2 % PADS 6 each, 6 each, Topical, Daily, Jefferson Fuel, MD, 6 each at 04/20/23 0957   cholecalciferol (VITAMIN D3) 25 MCG (1000 UNIT) tablet 1,000 Units, 1,000 Units, Oral, Daily, Burnadette Pop, MD, 1,000 Units at 04/20/23 0924   docusate sodium (COLACE) capsule 100 mg, 100 mg, Oral, Daily, Rochel Brome C, NP, 100 mg at 04/20/23 0924   enoxaparin (LOVENOX) injection 40 mg, 40 mg, Subcutaneous, Q24H, Adhikari, Amrit, MD, 40 mg at 04/20/23 1191   gabapentin (NEURONTIN) capsule 300 mg, 300 mg, Oral, TID, Violeta Gelinas, MD, 300 mg at 04/20/23 4782   hydrALAZINE (APRESOLINE) tablet 50 mg, 50 mg, Oral, Q8H, Violeta Gelinas, MD, 50 mg at 04/20/23 9562   insulin aspart (novoLOG) injection 0-6 Units, 0-6 Units, Subcutaneous, Q4H, Simonne Martinet, NP   labetalol (NORMODYNE) injection 10 mg, 10 mg, Intravenous, Q2H PRN, Micki Riley, MD, 10 mg at 04/19/23 0503   linaclotide (LINZESS) capsule 72 mcg, 72 mcg, Oral, QAC breakfast, Marvel Plan, MD   naloxone [facility]) injection 0.4 mg, 0.4 mg, Intravenous, PRN, Howerter, Justin B, DO   ondansetron (ZOFRAN) injection 4 mg, 4 mg, Intravenous, Q6H PRN, Howerter, Justin B, DO, 4 mg at 04/20/23 0920   Oral care mouth rinse, 15 mL, Mouth Rinse, 4 times per day, Pearlean Brownie, Pramod S, MD, 15 mL at 04/20/23 0805   Oral care mouth rinse, 15 mL, Mouth Rinse, PRN, Micki Riley, MD   pantoprazole (PROTONIX) EC tablet 40 mg, 40 mg, Oral, Daily, Autry, Lauren E, PA-C, 40 mg at 04/20/23 1308   polyethylene glycol (MIRALAX / GLYCOLAX) packet 17 g, 17 g, Oral, Daily, Violeta Gelinas, MD, 17 g at 04/20/23 0920   senna (SENOKOT) tablet 8.6 mg, 1 tablet, Oral, BID, Violeta Gelinas, MD, 8.6 mg at  04/20/23 6578   ticagrelor (BRILINTA) tablet 90 mg, 90 mg, Oral, BID **OR** [DISCONTINUED] ticagrelor (BRILINTA) tablet 90 mg, 90 mg, Per Tube, BID, Deveshwar, Sanjeev, MD, 90 mg at 04/17/23 0932  Patients Current Diet:  Diet Order             DIET DYS 3 Room service appropriate? No; Fluid consistency: Thin  Diet effective now  Precautions / Restrictions Precautions Precautions: Fall Precaution Comments: R inattention, SBP < 180 Restrictions Weight Bearing Restrictions Per Provider Order: No   Has the patient had 2 or more falls or a fall with injury in the past year?Yes  Prior Activity Level Community (5-7x/wk): independent prior to admit,using a rollator for back pain, disabled, driving  Prior Functional Level Prior Function Prior Level of Function : Independent/Modified Independent, Driving Mobility Comments: drives, needs to use Rollator all the time due to pain in LE and decreased endurance (per mother) - however, pt states she uses a SPC for mobility. ADLs Comments: in home caregiver, 3 day/wk cooking, cleaning per mothers report  Self Care: Did the patient need help bathing, dressing, using the toilet or eating?  Independent  Indoor Mobility: Did the patient need assistance with walking from room to room (with or without device)? Independent  Stairs: Did the patient need assistance with internal or external stairs (with or without device)? Independent  Functional Cognition: Did the patient need help planning regular tasks such as shopping or remembering to take medications? Independent  Patient Information Are you of Hispanic, Latino/a,or Spanish origin?: A. No, not of Hispanic, Latino/a, or Spanish origin What is your race?: B. Black or African American Do you need or want an interpreter to communicate with a doctor or health care staff?: 0. No  Patient's Response To:  Health Literacy and Transportation Is the patient able to respond to  health literacy and transportation needs?: Yes Health Literacy - How often do you need to have someone help you when you read instructions, pamphlets, or other written material from your doctor or pharmacy?: Never In the past 12 months, has lack of transportation kept you from medical appointments or from getting medications?: No In the past 12 months, has lack of transportation kept you from meetings, work, or from getting things needed for daily living?: No  Home Assistive Devices / Equipment Home Equipment: Rollator (4 wheels), BSC/3in1  Prior Device Use: Indicate devices/aids used by the patient prior to current illness, exacerbation or injury? Walker  Current Functional Level Cognition  Arousal/Alertness: Awake/alert Overall Cognitive Status: Difficult to assess Difficult to assess due to: Impaired communication Orientation Level: Other (comment) (UTA) General Comments: Inconsistent with yes/no responses, bright affect and smiling with achievement of walking today. Visibly frustrated when not able to get words out or communicate needs. responding well to gestures for mobility Attention: Sustained Sustained Attention: Impaired Sustained Attention Impairment: Verbal basic, Functional basic (R inattention/neglect) Awareness: Appears intact Behaviors: Restless, Verbal agitation, Perseveration    Extremity Assessment (includes Sensation/Coordination)  Upper Extremity Assessment: Generalized weakness RUE Deficits / Details: no active RUE movement, flaccid, no pain or light touch response LUE Deficits / Details: Seemingly WFL with observed functional tasks. difficult to fully assess due to impaired cognition  Lower Extremity Assessment: Defer to PT evaluation RLE Deficits / Details: 1/5 hip flexors and knee extensors, otherwise no active motion noted. no pain or light touch response RLE Coordination: decreased gross motor, decreased fine motor LLE Deficits / Details: ROM WFL, strength  grossly 3+/5, LLE Coordination: decreased fine motor    ADLs  Overall ADL's : Needs assistance/impaired Eating/Feeding: Independent Eating/Feeding Details (indicate cue type and reason): eating in long sitting upon arrival, no difficulties with FM or dexterity Grooming: Supervision/safety, Standing Upper Body Bathing: Set up, Sitting Lower Body Bathing: Contact guard assist, Sit to/from stand Upper Body Dressing : Set up, Sitting Lower Body Dressing: Contact guard assist, Sit to/from stand Toilet  Transfer: +2 for physical assistance, Moderate assistance Toileting- Clothing Manipulation and Hygiene: Supervision/safety, Sitting/lateral lean Toileting - Clothing Manipulation Details (indicate cue type and reason): unable to void Functional mobility during ADLs: Contact guard assist, Rolling walker (2 wheels) General ADL Comments: limited session secondary to pain    Mobility  Overal bed mobility: Needs Assistance Bed Mobility: Rolling, Sidelying to Sit, Sit to Sidelying Rolling: Contact guard assist Sidelying to sit: Contact guard assist Supine to sit: HOB elevated, Contact guard Sit to supine: Min assist, HOB elevated Sit to sidelying: Supervision General bed mobility comments: CGA for safety, cues for hand placement to pull on L bed rail, exited and sat eob on L side of bed. able to maintain un supported sitting while having BP taken. no lob.  pt. put self back in bed in L sidelying without cueing secondary to pain.  session ended, RN called from room and upated on pts. pain    Transfers  Overall transfer level: Needs assistance Equipment used: 1 person hand held assist Transfers: Sit to/from Stand Sit to Stand: Min assist, Contact guard assist Bed to/from chair/wheelchair/BSC transfer type:: Stand pivot Stand pivot transfers: Mod assist, +2 physical assistance General transfer comment: Light minA to rise up from edge of bed and initially steady, able to complete x5 at end of  session with CGA for safety    Ambulation / Gait / Stairs / Wheelchair Mobility  Ambulation/Gait Ambulation/Gait assistance: Min assist, Mod assist Gait Distance (Feet): 100 Feet (x2) Assistive device: 1 person hand held assist Gait Pattern/deviations: Step-through pattern, Decreased dorsiflexion - right, Decreased step length - right General Gait Details: HHA on L with min A to steady increasing to mod A with fatigue and increased distance, decreasing RLE clearance, step length, and weight shift to R with increased distance and fatigue Gait velocity: decreased Gait velocity interpretation: <1.31 ft/sec, indicative of household ambulator    Posture / Balance Balance Overall balance assessment: Needs assistance Sitting-balance support: No upper extremity supported, Feet supported Sitting balance-Leahy Scale: Good Standing balance support: Bilateral upper extremity supported, During functional activity Standing balance-Leahy Scale: Poor Standing balance comment: reliant on external support    Special needs/care consideration Diabetic management yes     Previous Home Environment (from acute therapy documentation) Living Arrangements: Alone  Lives With: Alone Available Help at Discharge: Family, Personal care attendant, Available PRN/intermittently Type of Home: House Home Layout: One level Home Access: Level entry Bathroom Shower/Tub: Engineer, manufacturing systems: Handicapped height Bathroom Accessibility: Yes Home Care Services: Yes Type of Home Care Services: Homehealth aide Additional Comments: pt's son is in [place], elderly parents are in [place]. Pt has a PCA.  Discharge Living Setting Plans for Discharge Living Setting: House, Lives with (comment) (Plans home with parents initially) Type of Home at Discharge: House [facility]) Discharge Home Layout: One level Discharge Home Access: Level entry Discharge Bathroom Shower/Tub: Tub/shower unit, Door Discharge Bathroom Toilet:  Handicapped height Discharge Bathroom Accessibility: Yes How Accessible: Accessible via wheelchair, Accessible via walker Does the patient have any problems obtaining your medications?: No  Social/Family/Support Systems Patient Roles: Parents, has son and dtr in law in [place] Information: Baldwin [place] - mother - [phone] Anticipated Caregiver: parents Ramon Dredge and Baldwin [place], local) and son Maryelizabeth Rowan, [place])  Apolinar Junes can be reached at [phone] and Dtr in [place] is [place] Anticipated Caregiver's Contact Information: see contacts Ability/Limitations of Caregiver: Parents are disabled and using walkers but can provide supervision Caregiver Availability: 24/7 Discharge Plan Discussed with Primary Caregiver:  Yes Is Caregiver In Agreement with Plan?: Yes Does Caregiver/Family have Issues with Lodging/Transportation while Pt is in Rehab?: No   Goals Patient/Family Goal for Rehab: PT/OT supervision to mod I, SLP min assist Expected length of stay: 7-10 days Additional Information: Plan is to go live with son Apolinar Junes and his wife in [place], but may have to DC to parents home initially. Pt/Family Agrees to Admission and willing to participate: Yes Program Orientation Provided & Reviewed with Pt/Caregiver Including Roles  & Responsibilities: Yes   Decrease burden of Care through IP rehab admission: n/a   Possible need for SNF placement upon discharge: Not anticipated.    Patient Condition: This patient's medical and functional status has changed since the consult dated: 04/19/23 in which the Rehabilitation Physician determined and documented that the patient's condition is appropriate for intensive rehabilitative care in an inpatient rehabilitation facility. See "History of Present Illness" (above) for medical update. Functional changes are: Requiring min to mod assist to ambulate 100' X2. Patient's medical and functional status update has been discussed with the  Rehabilitation physician and patient remains appropriate for inpatient rehabilitation. Will admit to inpatient rehab today.  Preadmission Screen Completed By:  Stephania Fragmin, PT,  DPT with updates by Roderic Palau, RN 04/20/2023 1:04 PM ______________________________________________________________________   Discussed status with Dr. Riley Kill on 04/20/23 at 0915 and received approval for admission today.  Admission Coordinator:  Trish Mage, time1306/Date1/31/25

## 2023-04-19 NOTE — Plan of Care (Signed)
  Problem: Education: Goal: Knowledge of disease or condition will improve Outcome: Progressing Goal: Knowledge of secondary prevention will improve (MUST DOCUMENT ALL) Outcome: Progressing Goal: Knowledge of patient specific risk factors will improve (DELETE if not current risk factor) Outcome: Progressing   Problem: Ischemic Stroke/TIA Tissue Perfusion: Goal: Complications of ischemic stroke/TIA will be minimized Outcome: Progressing   Problem: Coping: Goal: Will verbalize positive feelings about self Outcome: Progressing Goal: Will identify appropriate support needs Outcome: Progressing   Problem: Health Behavior/Discharge Planning: Goal: Ability to manage health-related needs will improve Outcome: Progressing Goal: Goals will be collaboratively established with patient/family Outcome: Progressing   Problem: Self-Care: Goal: Ability to participate in self-care as condition permits will improve Outcome: Progressing Goal: Verbalization of feelings and concerns over difficulty with self-care will improve Outcome: Progressing Goal: Ability to communicate needs accurately will improve Outcome: Progressing   Problem: Nutrition: Goal: Risk of aspiration will decrease Outcome: Progressing Goal: Dietary intake will improve Outcome: Progressing   Problem: Education: Goal: Knowledge of General Education information will improve Description: Including pain rating scale, medication(s)/side effects and non-pharmacologic comfort measures Outcome: Progressing   Problem: Health Behavior/Discharge Planning: Goal: Ability to manage health-related needs will improve Outcome: Progressing   Problem: Clinical Measurements: Goal: Ability to maintain clinical measurements within normal limits will improve Outcome: Progressing Goal: Will remain free from infection Outcome: Progressing Goal: Diagnostic test results will improve Outcome: Progressing Goal: Respiratory complications will  improve Outcome: Progressing Goal: Cardiovascular complication will be avoided Outcome: Progressing   Problem: Activity: Goal: Risk for activity intolerance will decrease Outcome: Progressing   Problem: Nutrition: Goal: Adequate nutrition will be maintained Outcome: Progressing   Problem: Coping: Goal: Level of anxiety will decrease Outcome: Progressing   Problem: Elimination: Goal: Will not experience complications related to bowel motility Outcome: Progressing Goal: Will not experience complications related to urinary retention Outcome: Progressing   Problem: Pain Managment: Goal: General experience of comfort will improve and/or be controlled Outcome: Progressing   Problem: Safety: Goal: Ability to remain free from injury will improve Outcome: Progressing   Problem: Skin Integrity: Goal: Risk for impaired skin integrity will decrease Outcome: Progressing   Problem: Education: Goal: Ability to describe self-care measures that may prevent or decrease complications (Diabetes Survival Skills Education) will improve Outcome: Progressing Goal: Individualized Educational Video(s) Outcome: Progressing   Problem: Coping: Goal: Ability to adjust to condition or change in health will improve Outcome: Progressing   Problem: Fluid Volume: Goal: Ability to maintain a balanced intake and output will improve Outcome: Progressing   Problem: Health Behavior/Discharge Planning: Goal: Ability to identify and utilize available resources and services will improve Outcome: Progressing Goal: Ability to manage health-related needs will improve Outcome: Progressing   Problem: Metabolic: Goal: Ability to maintain appropriate glucose levels will improve Outcome: Progressing   Problem: Nutritional: Goal: Maintenance of adequate nutrition will improve Outcome: Progressing Goal: Progress toward achieving an optimal weight will improve Outcome: Progressing   Problem: Skin  Integrity: Goal: Risk for impaired skin integrity will decrease Outcome: Progressing   Problem: Tissue Perfusion: Goal: Adequacy of tissue perfusion will improve Outcome: Progressing   Problem: Activity: Goal: Ability to tolerate increased activity will improve Outcome: Progressing   Problem: Respiratory: Goal: Ability to maintain a clear airway and adequate ventilation will improve Outcome: Progressing

## 2023-04-19 NOTE — Progress Notes (Signed)
Speech Language Pathology Treatment: Cognitive-Linquistic  Patient Details Name: Brittany [name] MRN: [id number] DOB: Apr 14, 1965 Today's Date: 04/19/2023 Time: 4782-9562 SLP Time Calculation (min) (ACUTE ONLY): 24 min  Assessment / Plan / Recommendation Clinical Impression  Pt seen for aphasia tx with melodic intonation therapy utilized to elicit simple words paired with repetition, sentence formulation, modeling and max phonemic/semantic cues provided. Oral/verbal apraxia questionable as pt intermittently completed oral movements during session given mod verbal/visual cues. Volitional commands were decreased without cues provided (70% for 1-step) with non-verbal cues and gestures used by pt to communicate simple information. She was able to state "okay" and "I' with groping noted during communicative attempts, athough this was perseverative in nature. Automatic responses observed during singing familiar songs such as "Happy Birthday" and "Amazing grace" with words paired with song easier to elicit. Rote counting/stating alphabet and/or days of week, etc. With max cues provided and 20% accuracy obtained.  Attempts at basic communication with verbal output such as "I need help" using various intonation patterns yielded 40% accuracy. Yes/no responses with gestures of head nod/shake produced 80% accuracy, but self-correction intermittently utilized. Nursing stated pt taking medication with puree without dysphagia symptoms present. ST will continue to f/u in acute setting for aphasia tx. Recommend CIR.   HPI HPI: Pt is a 58 y.o. female with medical history significant of stroke, hypertension, hyperlipidemia, obesity who presented at [facility] from home with complaint of difficulty with her speech on 01/23. CT angiogram showed areas of hypodensity in the left cerebral hemisphere. Patient was transferred to [facility] for further workup with MRI and neurology evaluation. MRI confirmed acute/subacute infarcts on  the left cerebral hemisphere consistent with left MCA territory stroke. Pt recently received thrombectomy and stent placement in left MCA due to decreased consciousness and right side weakness on 01/27, leading to intubation that ended on 01/28. PMHx significant of stroke, hypertension, hyperlipidemia, obesity; ST assessed on 04/13/23 with dx of aphasia; BSE completed with recs for Dysphagia 3/thin liquids.  ST f/u for aphasia tx.      SLP Plan  Continue with current plan of care      Recommendations for follow up therapy are one component of a multi-disciplinary discharge planning process, led by the attending physician.  Recommendations may be updated based on patient status, additional functional criteria and insurance authorization.    Recommendations  Diet recommendations: Dysphagia 3 (mechanical soft);Thin liquid Medication Administration: Whole meds with puree Supervision: Intermittent supervision to cue for compensatory strategies Compensations: Slow rate;Small sips/bites;Minimize environmental distractions                  Oral care BID   Frequent or constant Supervision/Assistance Dysphagia, unspecified (R13.10);Cognitive communication deficit (R41.841);Aphasia (R47.01)     Continue with current plan of care     Brittany [name],M.S.,CCC-SLP  04/19/2023, 12:11 PM

## 2023-04-19 NOTE — Progress Notes (Addendum)
Inpatient Rehab Coordinator Note:  I met with patient at bedside to discuss CIR recommendations and goals/expectations of CIR stay.  We reviewed 3 hrs/day of therapy, physician follow up, and average length of stay 2 weeks (dependent upon progress) with goals of supervision to mod I for mobility/ADLs, min assist for SLP.  I also updated her parents on the phone, and will pass along to her son as well.  Pt states she can stay with her parents or them with her at discharge; parents confirm that is a possibility, but more likely to d/c home with son in [place] if cleared by MD to travel.  I will need insurance approval and will start this request today.    Addendum 1646: unable to reach son, will continue efforts.   Estill Dooms, PT, DPT Admissions Coordinator [phone] 04/19/23  3:54 PM

## 2023-04-19 NOTE — Progress Notes (Addendum)
STROKE TEAM PROGRESS NOTE   BRIEF HPI [name] is a 58 y.o. female with PMH significant for previous left cerebellar stroke, hypertension, hyperlipidemia, peripheral neuropathy, multiple myeloma, IBS, GERD who presented to med [facility] 1/23 due to 1 to 2 weeks of word finding difficulty and dysarthria.  She was found to have left MCA territory stroke and severe left MCA stenosis.  Patient has a history of prior cerebellar stroke with minimal residual deficits.  Given the multiple days of symptoms patient was not a candidate for acute intervention at that time.  She was transferred to [facility] for further workup. 1/27, she was found to have acute onset of right gaze deviation, inability to follow commands and global aphasia.  CTA revealed proximal left M1 occlusion.  She was taken for mechanical thrombectomy after discussion with IR and consent being obtained from her son.   SIGNIFICANT HOSPITAL EVENTS 1/27: IR for L MCA M1 thrombectomy with revascularization via stent, TICI 3  INTERIM HISTORY/SUBJECTIVE No family at the bedside. Labs and vitals remained stable  Neurological exam is stable and unchanged  OBJECTIVE  CBC    Component Value Date/Time   WBC 7.2 04/18/2023 0610   RBC 3.76 (L) 04/18/2023 0610   HGB 11.0 (L) 04/18/2023 0610   HGB 15.1 12/01/2021 1410   HCT 33.1 (L) 04/18/2023 0610   HCT 46.1 12/01/2021 1410   PLT 148 (L) 04/18/2023 0610   PLT 164 12/01/2021 1410   MCV 88.0 04/18/2023 0610   MCV 87 12/01/2021 1410   MCH 29.3 04/18/2023 0610   MCHC 33.2 04/18/2023 0610   RDW 15.4 04/18/2023 0610   RDW 14.4 12/01/2021 1410   LYMPHSABS 1.5 04/17/2023 0456   LYMPHSABS 2.4 02/04/2019 1450   MONOABS 0.7 04/17/2023 0456   EOSABS 0.0 04/17/2023 0456   EOSABS 0.1 02/04/2019 1450   BASOSABS 0.0 04/17/2023 0456   BASOSABS 0.0 02/04/2019 1450    BMET    Component Value Date/Time   NA 137 04/18/2023 0610   NA 147 (H) 12/01/2021 1410   K 3.8  04/18/2023 0610   CL 108 04/18/2023 0610   CO2 18 (L) 04/18/2023 0610   GLUCOSE 79 04/18/2023 0610   BUN 14 04/18/2023 0610   BUN 8 12/01/2021 1410   CREATININE 1.22 (H) 04/18/2023 0610   CALCIUM 8.3 (L) 04/18/2023 0610   EGFR 71 12/01/2021 1410   GFRNONAA 52 (L) 04/18/2023 0610    IMAGING past 24 hours No results found.   Vitals:   04/19/23 0502 04/19/23 0724 04/19/23 1007 04/19/23 1211  BP: (!) 157/69 (!) 158/89 (!) 143/71 134/75  Pulse:  72  78  Resp:  17  17  Temp:  98.1 F (36.7 C)  98.4 F (36.9 C)  TempSrc:  Oral  Oral  SpO2:  100%  99%  Weight:      Height:         PHYSICAL EXAM General:  Alert, well-nourished, well-developed patient in no acute distress Psych:  Mood and affect appropriate for situation CV: Regular rate and rhythm on monitor Respiratory:  Regular, unlabored respirations on room air GI: Abdomen soft and nontender   NEURO:  Mental Status: Alert, is able to mimic commands only Speech/Language: Global aphasia present.  No verbal response.  Cranial Nerves:  II: PERRL.  No blink to threat on right III, IV, VI: EOMI. Eyelids elevate symmetrically.  V: Sensation is intact to light touch and symmetrical to face.  VII: Right facial  droop VIII: hearing intact to voice. IX, X: Palate elevates symmetrically.  WG:NFAOZHYQ shrug 5/5. XII: tongue is midline without fasciculations. Motor:  RUE/RLE 1/5, gravity eliminated she is able to hold arm and leg off the bed, arm hits the bed before 10 seconds, leg is able to hold for 5 seconds Tone: is normal and bulk is normal Sensation- Intact to light touch bilaterally. Extinction absent to light touch to DSS.   Coordination: unable to perform  Gait- deferred  Most Recent NIH: 5    ASSESSMENT/PLAN  Acute Ischemic Infarct:  left MCA territory due to left MCA occlusion with underlying stenosis s/p mechanical thrombectomy with rescue stent, TICI3 revascularization Etiology:  small vessel disease  1/27  Code Stroke CT head  Expected evolution of recently seen ischemic infarct left MCA territory No definite new infarcts present No significant hemorrhage  ASPECTS 10 CTA head & neck  Proximal left M1 occlusion Post IR CT: Small focal areas of contrast staining in previous subacute infarcts.  No gross intracranial hemorrhage evident. MRI  1/23 Scattered acute to early subacute ischemic infarcts left cerebral hemisphere, left MCA distribution. Few scattered superimposed late subacute to chronic left MCA distribution infarct Chronic left cerebellar infarct  Repeat MRI: acute left MCA infarct with intraparenchymal hemorrhage centered in the left basal ganglia.  MRA Head: Left M1 is patent  2D Echo: LVEF 60-65%, Moderately dilated left atria Likely recommend 30-day versus Loop Recorder on discharge  LDL 124 HgbA1c 5.9 VTE prophylaxis - lovenox No antithrombotic prior to admission, continue aspirin 81 mg daily and Brilinta (ticagrelor) 90 mg bid  Therapy recommendations:  CIR Disposition:  pending  Hypertension Home meds:  amlodipine 10mg , hydrochlorothiazide 12.5mg  Stable Blood Pressure Goal: SBP 120-160 for first 24 hours then less than 180   Hyperlipidemia Home meds:  Lipitor 80mg  LDL 124, goal < 70 Add Zetia  Continue Zetia and statin at discharge  Diabetes type II, Pre-Diabetic Home meds:  none HgbA1c 5.9, goal < 7.0 CBGs SSI Recommend close follow-up with PCP for better DM control  Tobacco Abuse Former cigarette smoker  Dysphagia Patient has post-stroke dysphagia, SLP consulted    Diet   DIET DYS 3 Room service appropriate? No; Fluid consistency: Thin   Advance diet as tolerated  Other Stroke Risk Factors Obesity, Body mass index is 29.44 kg/m., BMI >/= 30 associated with increased stroke risk, recommend weight loss, diet and exercise as appropriate  Family hx stroke (maternal uncle)   Hospital day # 6   Pt seen by Neuro NP/APP and later by MD. Note/plan  to be edited by MD as needed.     Gevena Mart DNP, ACNPC-AG  [facility]  I have personally obtained history,examined this patient, reviewed notes, independently viewed imaging studies, participated in medical decision making and plan of care.ROS completed by me personally and pertinent positives fully documented  I have made any additions or clarifications directly to the above note. Agree with note above.  Patient neurological exam remains stable.  She is awaiting bed and inpatient rehab pending insurance approval and bed availability.  Delia Heady, MD Medical Director [facility] Pager: [phone] 04/19/2023 4:44 PM    To contact Stroke Continuity provider, please refer to [email or web address]. After hours, contact General Neurology

## 2023-04-19 NOTE — Progress Notes (Signed)
Physical Therapy Treatment Patient Details Name: Brittany [name] MRN: [id number] DOB: 11-08-1965 Today's Date: 04/19/2023   History of Present Illness 58 yo female presents to ED on 1/23 with AMS, slurred speech. MRI 1/23 revealed scattered acute to early subacute ischemic infarcts involving L cerebral hemisphere, L MCA distribution. Rapid response called 1/27 for AMS, R weakness; s/p L arteriogram via R CFA approach for occluded L M1 segment on 1/27. ETT 1/27-1/28. PHMx: Anemia, arthritis, elevated cholesterol, GERD, heart murmur, female bladder prolapse, IBS, hiatal hernia, stroke, multiple myeloma, SOB, and peripheral neuropathy.    PT Comments  Pt resting in bed on arrival, eager for mobility, and demonstrating continued progress towards acute goals. Pt continues to be limited by communication deficits, RLE weakness, impaired coordination, decreased activity tolerance and impaired balance/postural reactions needing up to mod A for OOB mobility for safety and stability. Pt progressing gait distance this session with up to mod A to maintain balance with HHA on L. Pt able to perform exercises at end of session for strength maintenance, responding well to guestural cueing for technique. Current plan remains appropriate to address deficits and maximize functional independence and decrease caregiver burden. Pt continues to benefit from skilled PT services to progress toward functional mobility goals.      If plan is discharge home, recommend the following: Assistance with cooking/housework;Direct supervision/assist for medications management;Direct supervision/assist for financial management;Assist for transportation;Help with stairs or ramp for entrance;Supervision due to cognitive status;A lot of help with walking and/or transfers;A lot of help with bathing/dressing/bathroom   Can travel by private vehicle        Equipment Recommendations  Other (comment) (TBD)    Recommendations for Other  Services       Precautions / Restrictions Precautions Precautions: Fall Precaution Comments: R inattention, SBP < 180 Restrictions Weight Bearing Restrictions Per Provider Order: No     Mobility  Bed Mobility Overal bed mobility: Needs Assistance Bed Mobility: Supine to Sit, Sit to Supine     Supine to sit: HOB elevated, Contact guard Sit to supine: Min assist, HOB elevated   General bed mobility comments: CGA for safety, min A to return LEs to bed    Transfers Overall transfer level: Needs assistance Equipment used: 1 person hand held assist Transfers: Sit to/from Stand Sit to Stand: Min assist, Contact guard assist           General transfer comment: Light minA to rise up from edge of bed and initially steady, able to complete x5 at end of session with CGA for safety    Ambulation/Gait Ambulation/Gait assistance: Min assist, Mod assist Gait Distance (Feet): 100 Feet (x2) Assistive device: 1 person hand held assist Gait Pattern/deviations: Step-through pattern, Decreased dorsiflexion - right, Decreased step length - right Gait velocity: decreased     General Gait Details: HHA on L with min A to steady increasing to mod A with fatigue and increased distance, decreasing RLE clearance, step length, and weight shift to R with increased distance and fatigue   Stairs             Wheelchair Mobility     Tilt Bed    Modified Rankin (Stroke Patients Only) Modified Rankin (Stroke Patients Only) Pre-Morbid Rankin Score: No significant disability Modified Rankin: Moderately severe disability     Balance Overall balance assessment: Needs assistance Sitting-balance support: No upper extremity supported, Feet supported Sitting balance-Leahy Scale: Good     Standing balance support: Bilateral upper extremity supported, During functional activity Standing  balance-Leahy Scale: Poor Standing balance comment: reliant on external support                             Cognition Arousal: Alert Behavior During Therapy: WFL for tasks assessed/performed Overall Cognitive Status: Difficult to assess                                 General Comments: Inconsistent with yes/no responses, bright affect and smiling with achievement of walking today. Visibly frustrated when not able to get words out or communicate needs. responding well to gestures for mobility        Exercises General Exercises - Lower Extremity Long Arc Quad: AROM, AAROM, Right, 10 reps, Seated Hip Flexion/Marching: AROM, Right, Seated, 5 reps Other Exercises Other Exercises: serial sit<>stand x5    General Comments General comments (skin integrity, edema, etc.): pt parents present and supportive      Pertinent Vitals/Pain Pain Assessment Pain Assessment: Faces Faces Pain Scale: No hurt Pain Intervention(s): Monitored during session    Home Living                          Prior Function            PT Goals (current goals can now be found in the care plan section) Acute Rehab PT Goals PT Goal Formulation: With patient Time For Goal Achievement: 05/01/23 Progress towards PT goals: Progressing toward goals    Frequency    Min 1X/week      PT Plan      Co-evaluation              AM-PAC PT "6 Clicks" Mobility   Outcome Measure  Help needed turning from your back to your side while in a flat bed without using bedrails?: A Little Help needed moving from lying on your back to sitting on the side of a flat bed without using bedrails?: A Little Help needed moving to and from a bed to a chair (including a wheelchair)?: A Little Help needed standing up from a chair using your arms (e.g., wheelchair or bedside chair)?: A Little Help needed to walk in hospital room?: A Little Help needed climbing 3-5 steps with a railing? : A Lot 6 Click Score: 17    End of Session Equipment Utilized During Treatment: Gait belt Activity  Tolerance: Patient tolerated treatment well Patient left: with call bell/phone within reach;in bed;with bed alarm set;with family/visitor present Nurse Communication: Mobility status PT Visit Diagnosis: Muscle weakness (generalized) (M62.81);Difficulty in walking, not elsewhere classified (R26.2);Other symptoms and signs involving the nervous system (R29.898);Dizziness and giddiness (R42);Unsteadiness on feet (R26.81)     Time: 8657-8469 PT Time Calculation (min) (ACUTE ONLY): 14 min  Charges:    $Gait Training: 8-22 mins PT General Charges $$ ACUTE PT VISIT: 1 Visit                     Leng Montesdeoca R. PTA Acute Rehabilitation Services Office: [phone]   Catalina Antigua 04/19/2023, 2:51 PM

## 2023-04-19 NOTE — Consult Note (Signed)
Physical Medicine and Rehabilitation Consult Reason for Consult:functional/language deficits after stroke Referring Physician: Pearlean Brownie   HPI: Brittany [name] is a 58 y.o. female with a history of previous left cerebellar stroke, hypertension, peripheral neuropathy, multiple myeloma who initially presented on 04/12/2023 with 1 to 2 weeks of word finding deficits and dysarthria.  She was found to have a left MCA infarct due to severe left MCA stenosis.  She was transferred to [facility] for further workup.  On 127 she was found to have acute onset of right gaze deviation and more significant aphasia.  CT angiogram revealed proximal left M1 occlusion.  She underwent mechanical thrombectomy.  MRI of the brain revealed infarct in the low left basal ganglia with intraparenchymal hemorrhage.  Patient was up with therapy yesterday and was min assist for sit to stand transfers and min assist 60 feet plus two assist/hand-held assist.  Therapy reports problems with right foot clearance and knee buckling.   Review of Systems  Unable to perform ROS: Language   Past Medical History:  Diagnosis Date   Anemia    Arthritis    trigger finger in left hand   Complication of anesthesia    per pt, hard to wake up!   Elevated cholesterol    Female bladder prolapse    per urologist, does not have prolaspe   GERD (gastroesophageal reflux disease)    Heart murmur    pt unsure.   Hiatal hernia    Hypertension    IBS (irritable bowel syndrome)    Multiple myeloma (HCC) 2005   had partial chemo   Peripheral neuropathy    SOB (shortness of breath) on exertion    uses an inhaler   Stroke (HCC) 2017   paralysis left arm/uses a walker   Past Surgical History:  Procedure Laterality Date   BONE BIOPSY  2005   in her back   COLONOSCOPY     over 10 years x3   ESOPHAGOGASTRODUODENOSCOPY     incomplete-over 10 years ago    IR CT HEAD LTD  04/16/2023   IR CT HEAD LTD  04/16/2023   IR INTRA CRAN STENT   04/16/2023   IR PERCUTANEOUS ART THROMBECTOMY/INFUSION INTRACRANIAL INC DIAG ANGIO  04/16/2023   RADIOLOGY WITH ANESTHESIA N/A 04/16/2023   Procedure: IR WITH ANESTHESIA;  Surgeon: Julieanne Cotton, MD;  Location: [facility] OR;  Service: Radiology;  Laterality: N/A;   TEE WITHOUT CARDIOVERSION N/A 12/08/2016   Procedure: TRANSESOPHAGEAL ECHOCARDIOGRAM (TEE);  Surgeon: Quintella Reichert, MD;  Location: [facility] ENDOSCOPY;  Service: Cardiovascular;  Laterality: N/A;   TUBAL LIGATION     UPPER GASTROINTESTINAL ENDOSCOPY     WISDOM TOOTH EXTRACTION     Family History  Problem Relation Age of Onset   Hypertension Mother    Sarcoidosis Mother        currently in remission    Diverticulitis Mother    Irritable bowel syndrome Mother    Liver cancer Mother    Hypertension Father    Stomach cancer Father    Congestive Heart Failure Father    Stroke Maternal Uncle    Scoliosis Brother    Colon cancer Neg Hx    Esophageal cancer Neg Hx    Colon polyps Neg Hx    Rectal cancer Neg Hx    Social History:  reports that she quit smoking about 7 years ago. Her smoking use included cigarettes. She has never used smokeless tobacco. She reports that she does  not currently use alcohol. She reports that she does not currently use drugs after having used the following drugs: Marijuana. Allergies:  Allergies  Allergen Reactions   Amitriptyline Other (See Comments)    Patient reported that it made her throat feel like its locking up and it also caused issues with her going to the bathroom   Duloxetine Other (See Comments)    Patient reported that it made her throat lock up and it caused her to have issue with going to the bathroom   Naproxen     Vomiting, sweating, abd spasms   Other     States can't take pain meds that end in "cet" or meds that end in "ine" Darvocet/severe vomiting   Beef-Derived Drug Products     Patient has IBS prefers no beef   Pork-Derived Products     Patient has IBS prefers no pork   Darvon  [Propoxyphene] Nausea And Vomiting   Hydrocodone Nausea And Vomiting   Lactose Intolerance (Gi)     Bloating, gas, abd pain   Latex Itching and Rash   Oxycodone Nausea And Vomiting   Percocet [Oxycodone-Acetaminophen] Nausea And Vomiting   Topamax [Topiramate]     Memory made her emotional    Medications Prior to Admission  Medication Sig Dispense Refill   albuterol (VENTOLIN HFA) 108 (90 Base) MCG/ACT inhaler Inhale 2 puffs into the lungs every 4 (four) hours as needed for wheezing or shortness of breath. 8.5 g 11   atorvastatin (LIPITOR) 80 MG tablet Take 1 tablet (80 mg total) by mouth daily. 90 tablet 3   gabapentin (NEURONTIN) 300 MG capsule Take 1 capsule (300 mg total) by mouth 3 (three) times daily. 90 capsule 3   hydrALAZINE (APRESOLINE) 50 MG tablet Take 1 tablet (50 mg total) by mouth every 8 (eight) hours. 90 tablet 0   hydrochlorothiazide (HYDRODIURIL) 12.5 MG tablet Take 12.5 mg by mouth daily.     linaclotide (LINZESS) 72 MCG capsule Take 1 capsule (72 mcg total) by mouth daily before breakfast. 90 capsule 1   losartan (COZAAR) 50 MG tablet Take 1 tablet (50 mg total) by mouth daily. 30 tablet 5   omeprazole (PRILOSEC) 20 MG capsule Take 1 capsule (20 mg total) by mouth 2 (two) times daily before a meal. 180 capsule 3   amLODipine (NORVASC) 10 MG tablet Take 1 tablet (10 mg total) by mouth daily. (Patient not taking: Reported on 04/12/2023) 30 tablet 0   chlorthalidone (HYGROTON) 25 MG tablet Take 1 tablet (25 mg total) by mouth daily. (Patient not taking: Reported on 04/12/2023) 30 tablet 0   triamcinolone cream (KENALOG) 0.1 % APPLY 1 APPLICATION TOPICALLY 2 (TWO) TIMES DAILY. (Patient not taking: Reported on 04/12/2023) 454 g 2    Home: Home Living Family/patient expects to be discharged to:: Private residence Living Arrangements: Alone Available Help at Discharge: Family, Personal care attendant, Available PRN/intermittently Type of Home: House Home Access: Level  entry Home Layout: One level Bathroom Shower/Tub: Engineer, manufacturing systems: Handicapped height Bathroom Accessibility: Yes Home Equipment: Rollator (4 wheels), BSC/3in1 Additional Comments: pt's son is in [place], elderly parents are in [place]. Pt has a PCA.  Lives With: Alone  Functional History: Prior Function Prior Level of Function : Independent/Modified Independent, Driving Mobility Comments: drives, needs to use Rollator all the time due to pain in LE and decreased endurance (per mother) - however, pt states she uses a SPC for mobility. ADLs Comments: in home caregiver, 3 day/wk cooking, cleaning per  mothers report Functional Status:  Mobility: Bed Mobility Overal bed mobility: Needs Assistance Bed Mobility: Supine to Sit Supine to sit: Mod assist Sit to supine: Contact guard assist General bed mobility comments: CGA for safety, exiting towards left side of bed Transfers Overall transfer level: Needs assistance Equipment used: 2 person hand held assist Transfers: Sit to/from Stand Sit to Stand: Min assist, +2 safety/equipment Bed to/from chair/wheelchair/BSC transfer type:: Stand pivot Stand pivot transfers: Mod assist, +2 physical assistance General transfer comment: Light minA to rise up from edge of bed and initially steady Ambulation/Gait Ambulation/Gait assistance: Min assist, +2 safety/equipment Gait Distance (Feet): 60 Feet Assistive device: 2 person hand held assist Gait Pattern/deviations: Step-through pattern, Decreased dorsiflexion - right, Decreased step length - right General Gait Details: Decreased R foot clearance, weight shift and step length. MinA + 2 for balance via HHA, no R knee buckle noted. Gait velocity: decreased Gait velocity interpretation: <1.31 ft/sec, indicative of household ambulator    ADL: ADL Overall ADL's : Needs assistance/impaired Eating/Feeding: Independent Eating/Feeding Details (indicate cue type and reason): eating in long  sitting upon arrival, no difficulties with FM or dexterity Grooming: Supervision/safety, Standing Upper Body Bathing: Set up, Sitting Lower Body Bathing: Contact guard assist, Sit to/from stand Upper Body Dressing : Set up, Sitting Lower Body Dressing: Contact guard assist, Sit to/from stand Toilet Transfer: +2 for physical assistance, Moderate assistance Toileting- Clothing Manipulation and Hygiene: Supervision/safety, Sitting/lateral lean Toileting - Clothing Manipulation Details (indicate cue type and reason): unable to void Functional mobility during ADLs: Contact guard assist, Rolling walker (2 wheels) General ADL Comments: pt limited by expressive communication and impulsivity. CGA required for safety with cues  Cognition: Cognition Overall Cognitive Status: Difficult to assess Arousal/Alertness: Awake/alert Orientation Level: Oriented to person, Oriented to situation, Oriented to place, Disoriented to time Attention: Sustained Sustained Attention: Impaired Sustained Attention Impairment: Verbal basic, Functional basic (R inattention/neglect) Awareness: Appears intact Behaviors: Restless, Verbal agitation, Perseveration Cognition Arousal: Alert Behavior During Therapy: WFL for tasks assessed/performed Overall Cognitive Status: Difficult to assess Area of Impairment: Memory Memory:  (decreased memory of PLOF) General Comments: Inconsistent with yes/no responses, bright affect and smiling with achievement of walking today. Visibly frustrated when not able to get words out or communicate needs. Able to wash hands at sink but needed cueing for using soap and drying. Difficult to assess due to: Impaired communication  Blood pressure (!) 143/71, pulse 72, temperature 98.1 F (36.7 C), temperature source Oral, resp. rate 17, height 5\' 2"  (1.575 m), weight 73 kg, SpO2 100%. Physical Exam Constitutional:      General: She is not in acute distress. HENT:     Head: Normocephalic and  atraumatic.     Right Ear: External ear normal.     Left Ear: External ear normal.     Nose: Nose normal.     Mouth/Throat:     Mouth: Mucous membranes are moist.  Eyes:     Conjunctiva/sclera: Conjunctivae normal.  Cardiovascular:     Rate and Rhythm: Normal rate.  Pulmonary:     Effort: Pulmonary effort is normal.  Abdominal:     Palpations: Abdomen is soft.  Musculoskeletal:        General: Tenderness (right shoulder with IR/ER/impingement maneuver) present.     Cervical back: Normal range of motion.  Skin:    General: Skin is warm and dry.  Neurological:     Mental Status: She is alert.     Comments: Pt is alert.  She makes  eye contact with the examiner.  No focal visual abnormalities.  May have mild right facial weakness.  Demonstrates comprehension for simple yes no questions but has difficulty with simple problem-solving and following steps during manual muscle testing.  May have an element of apraxia.  She was nonverbal.  Right upper extremity appears grossly 4 out of 5 proximal distal with some inhibition due to pain at the shoulder.  Right lower extremity is similar at at least 4 - to 4 out of 5.  Left upper extremity and left lower extremity move freely.  She seems to sense pain in all 4 limbs.  Deep tendon reflexes are 1+.  Toes were down.  Psychiatric:     Comments: Patient was pleasant and appeared generally upbeat.  Perhaps a bit anxious     Results for orders placed or performed during the hospital encounter of 04/12/23 (from the past 24 hours)  Glucose, capillary     Status: Abnormal   Collection Time: 04/18/23 11:44 AM  Result Value Ref Range   Glucose-Capillary 102 (H) 70 - 99 mg/dL  Glucose, capillary     Status: Abnormal   Collection Time: 04/18/23  4:20 PM  Result Value Ref Range   Glucose-Capillary 103 (H) 70 - 99 mg/dL  Glucose, capillary     Status: Abnormal   Collection Time: 04/18/23  7:38 PM  Result Value Ref Range   Glucose-Capillary 115 (H) 70 - 99  mg/dL  Glucose, capillary     Status: Abnormal   Collection Time: 04/18/23 11:36 PM  Result Value Ref Range   Glucose-Capillary 120 (H) 70 - 99 mg/dL  Glucose, capillary     Status: None   Collection Time: 04/19/23  3:44 AM  Result Value Ref Range   Glucose-Capillary 97 70 - 99 mg/dL  Glucose, capillary     Status: Abnormal   Collection Time: 04/19/23  7:23 AM  Result Value Ref Range   Glucose-Capillary 110 (H) 70 - 99 mg/dL   Comment 1 Notify RN    MR BRAIN WO CONTRAST Result Date: 04/18/2023 CLINICAL DATA:  Stroke, follow up; Neuro deficit, acute, stroke suspected EXAM: MRI HEAD WITHOUT CONTRAST MRA HEAD WITHOUT CONTRAST TECHNIQUE: Multiplanar, multi-echo pulse sequences of the brain and surrounding structures were acquired without intravenous contrast. Angiographic images of the Circle of Willis were acquired using MRA technique without intravenous contrast. COMPARISON:  CT head 04/16/2023. CTA head/neck 04/16/2023. FINDINGS: MRI HEAD FINDINGS Brain: Acute left MCA territory infarct which involves the left basal ganglia, overlying left frontal lobe, left parietal lobe and anterior left temporal lobe. Intraparenchymal hemorrhage centered in the left basal ganglia appears similar when comparing across modalities to recent CT head. Similar mild rightward midline shift. No visible mass lesion. No hydrocephalus. Remote left cerebellar infarcts. Vascular: See below. Skull and upper cervical spine: Normal marrow signal. Sinuses/Orbits: Mild paranasal sinus mucosal thickening. No acute orbital findings. Other: No mastoid effusions. MRA HEAD FINDINGS Anterior circulation: Bilateral intracranial ICAs, MCAs and ACAs are patent without proximal hemodynamically significant stenosis. There is a stent in the left M1 MCA which obscures the vessel in this region. No aneurysm identified. Posterior circulation: Bilateral intradural vertebral arteries, basilar artery and bilateral posterior cerebral arteries are  patent without proximal hemodynamically significant stenosis. No aneurysm identified. IMPRESSION: 1. Acute left MCA territory infarct with similar intraparenchymal hemorrhage centered in the left basal ganglia when comparing across modalities to recent CT head. Similar mild rightward midline shift. 2. Left M1 MCA is now patent.  No large vessel occlusion. Electronically Signed   By: Feliberto Harts M.D.   On: 04/18/2023 01:28   MR ANGIO HEAD WO CONTRAST Result Date: 04/18/2023 CLINICAL DATA:  Stroke, follow up; Neuro deficit, acute, stroke suspected EXAM: MRI HEAD WITHOUT CONTRAST MRA HEAD WITHOUT CONTRAST TECHNIQUE: Multiplanar, multi-echo pulse sequences of the brain and surrounding structures were acquired without intravenous contrast. Angiographic images of the Circle of Willis were acquired using MRA technique without intravenous contrast. COMPARISON:  CT head 04/16/2023. CTA head/neck 04/16/2023. FINDINGS: MRI HEAD FINDINGS Brain: Acute left MCA territory infarct which involves the left basal ganglia, overlying left frontal lobe, left parietal lobe and anterior left temporal lobe. Intraparenchymal hemorrhage centered in the left basal ganglia appears similar when comparing across modalities to recent CT head. Similar mild rightward midline shift. No visible mass lesion. No hydrocephalus. Remote left cerebellar infarcts. Vascular: See below. Skull and upper cervical spine: Normal marrow signal. Sinuses/Orbits: Mild paranasal sinus mucosal thickening. No acute orbital findings. Other: No mastoid effusions. MRA HEAD FINDINGS Anterior circulation: Bilateral intracranial ICAs, MCAs and ACAs are patent without proximal hemodynamically significant stenosis. There is a stent in the left M1 MCA which obscures the vessel in this region. No aneurysm identified. Posterior circulation: Bilateral intradural vertebral arteries, basilar artery and bilateral posterior cerebral arteries are patent without proximal  hemodynamically significant stenosis. No aneurysm identified. IMPRESSION: 1. Acute left MCA territory infarct with similar intraparenchymal hemorrhage centered in the left basal ganglia when comparing across modalities to recent CT head. Similar mild rightward midline shift. 2. Left M1 MCA is now patent.  No large vessel occlusion. Electronically Signed   By: Feliberto Harts M.D.   On: 04/18/2023 01:28   DG Abd Portable 1V Result Date: 04/17/2023 CLINICAL DATA:  Bowel impaction EXAM: PORTABLE ABDOMEN - 1 VIEW COMPARISON:  04/16/2023 FINDINGS: The patient is rotated to the right on today's radiograph, reducing diagnostic sensitivity and specificity. Previous nasogastric tube no longer visible along the abdomen. No visible feeding tube. There is scattered gas in the colon without a substantial burden of colonic stool. No dilated small bowel is identified. IMPRESSION: 1. No substantial burden of colonic stool is identified. 2. Previous nasogastric tube no longer visible along the abdomen. Electronically Signed   By: Gaylyn Rong M.D.   On: 04/17/2023 18:43    Assessment/Plan: Diagnosis: 58 year old female with left MCA territory infarct status post thrombectomy with subsequent mild right hemiparesis but but with profound aphasia expressive greater than receptive.  History of previous left cerebellar infarct Does the need for close, 24 hr/day medical supervision in concert with the patient's rehab needs make it unreasonable for this patient to be served in a less intensive setting? Yes Co-Morbidities requiring supervision/potential complications:  -Hypertension -Diabetes type 2 -Dysphagia -?right rotator cuff injury -History of peripheral neuropathy -History of IBS -History of multiple myeloma Due to bladder management, bowel management, safety, skin/wound care, disease management, medication administration, pain management, and patient education, does the patient require 24 hr/day rehab  nursing? Yes Does the patient require coordinated care of a physician, rehab nurse, therapy disciplines of PT, OT, SLP to address physical and functional deficits in the context of the above medical diagnosis(es)? Yes Addressing deficits in the following areas: balance, endurance, locomotion, strength, transferring, bowel/bladder control, bathing, dressing, feeding, grooming, toileting, cognition, speech, language, swallowing, and psychosocial support Can the patient actively participate in an intensive therapy program of at least 3 hrs of therapy per day at least 5 days per week? Yes The  potential for patient to make measurable gains while on inpatient rehab is excellent Anticipated functional outcomes upon discharge from inpatient rehab are supervision  with PT, supervision with OT, supervision and min assist with SLP. Estimated rehab length of stay to reach the above functional goals is: 7-10 days Anticipated discharge destination: Home Overall Rehab/Functional Prognosis: excellent  POST ACUTE RECOMMENDATIONS: This patient's condition is appropriate for continued rehabilitative care in the following setting:  CIR, see below Patient has agreed to participate in recommended program. Yes Note that insurance prior authorization may be required for reimbursement for recommended care.  Comment: Patient has potential to make substantive gains on inpatient rehab from a mobility and safety standpoint in addition to language.  She will need help at home due to her aphasia.  It is unclear if she has surrounding supports to provide that. Rehab Admissions Coordinator to follow up.     MEDICAL RECOMMENDATIONS: A/PROM exercises RUE with OT or PT. Would benefit from topical voltaren,  heating pad or cool compresses to help with pain.    I have personally performed a face to face diagnostic evaluation of this patient. Additionally, I have examined the patient's medical record including any pertinent labs and  radiographic images. If the physician assistant has documented in this note, I have reviewed and edited or otherwise concur with the physician assistant's documentation.  Thanks,  Ranelle Oyster, MD 04/19/2023

## 2023-04-20 ENCOUNTER — Inpatient Hospital Stay (HOSPITAL_COMMUNITY): Payer: Medicaid Other

## 2023-04-20 ENCOUNTER — Encounter (HOSPITAL_COMMUNITY): Payer: Self-pay | Admitting: Physical Medicine & Rehabilitation

## 2023-04-20 ENCOUNTER — Inpatient Hospital Stay (HOSPITAL_COMMUNITY)
Admission: AD | Admit: 2023-04-20 | Discharge: 2023-05-03 | DRG: 057 | Disposition: A | Discharge status: Home Health | Payer: Medicaid Other | Source: Intra-hospital | Attending: Physical Medicine & Rehabilitation | Admitting: Physical Medicine & Rehabilitation

## 2023-04-20 ENCOUNTER — Other Ambulatory Visit: Payer: Self-pay

## 2023-04-20 DIAGNOSIS — I10 Essential (primary) hypertension: Secondary | ICD-10-CM | POA: Diagnosis not present

## 2023-04-20 DIAGNOSIS — L7632 Postprocedural hematoma of skin and subcutaneous tissue following other procedure: Secondary | ICD-10-CM | POA: Diagnosis present

## 2023-04-20 DIAGNOSIS — Z87891 Personal history of nicotine dependence: Secondary | ICD-10-CM

## 2023-04-20 DIAGNOSIS — F54 Psychological and behavioral factors associated with disorders or diseases classified elsewhere: Secondary | ICD-10-CM | POA: Diagnosis not present

## 2023-04-20 DIAGNOSIS — I63512 Cerebral infarction due to unspecified occlusion or stenosis of left middle cerebral artery: Principal | ICD-10-CM | POA: Diagnosis present

## 2023-04-20 DIAGNOSIS — Z8249 Family history of ischemic heart disease and other diseases of the circulatory system: Secondary | ICD-10-CM | POA: Diagnosis not present

## 2023-04-20 DIAGNOSIS — Z823 Family history of stroke: Secondary | ICD-10-CM

## 2023-04-20 DIAGNOSIS — Y838 Other surgical procedures as the cause of abnormal reaction of the patient, or of later complication, without mention of misadventure at the time of the procedure: Secondary | ICD-10-CM | POA: Diagnosis present

## 2023-04-20 DIAGNOSIS — I69334 Monoplegia of upper limb following cerebral infarction affecting left non-dominant side: Secondary | ICD-10-CM

## 2023-04-20 DIAGNOSIS — E7800 Pure hypercholesterolemia, unspecified: Secondary | ICD-10-CM | POA: Diagnosis not present

## 2023-04-20 DIAGNOSIS — K581 Irritable bowel syndrome with constipation: Secondary | ICD-10-CM | POA: Diagnosis present

## 2023-04-20 DIAGNOSIS — Z91014 Allergy to mammalian meats: Secondary | ICD-10-CM

## 2023-04-20 DIAGNOSIS — Z885 Allergy status to narcotic agent status: Secondary | ICD-10-CM

## 2023-04-20 DIAGNOSIS — B370 Candidal stomatitis: Secondary | ICD-10-CM | POA: Diagnosis not present

## 2023-04-20 DIAGNOSIS — I6930 Unspecified sequelae of cerebral infarction: Secondary | ICD-10-CM | POA: Diagnosis not present

## 2023-04-20 DIAGNOSIS — C9000 Multiple myeloma not having achieved remission: Secondary | ICD-10-CM | POA: Diagnosis not present

## 2023-04-20 DIAGNOSIS — I69322 Dysarthria following cerebral infarction: Secondary | ICD-10-CM | POA: Diagnosis not present

## 2023-04-20 DIAGNOSIS — E876 Hypokalemia: Secondary | ICD-10-CM | POA: Diagnosis present

## 2023-04-20 DIAGNOSIS — Z7902 Long term (current) use of antithrombotics/antiplatelets: Secondary | ICD-10-CM

## 2023-04-20 DIAGNOSIS — I69312 Visuospatial deficit and spatial neglect following cerebral infarction: Secondary | ICD-10-CM | POA: Diagnosis not present

## 2023-04-20 DIAGNOSIS — K589 Irritable bowel syndrome without diarrhea: Secondary | ICD-10-CM | POA: Diagnosis not present

## 2023-04-20 DIAGNOSIS — I69320 Aphasia following cerebral infarction: Secondary | ICD-10-CM | POA: Diagnosis not present

## 2023-04-20 DIAGNOSIS — D649 Anemia, unspecified: Secondary | ICD-10-CM | POA: Diagnosis not present

## 2023-04-20 DIAGNOSIS — Z79899 Other long term (current) drug therapy: Secondary | ICD-10-CM

## 2023-04-20 DIAGNOSIS — Q2112 Patent foramen ovale: Secondary | ICD-10-CM

## 2023-04-20 DIAGNOSIS — R7303 Prediabetes: Secondary | ICD-10-CM | POA: Diagnosis not present

## 2023-04-20 DIAGNOSIS — R109 Unspecified abdominal pain: Secondary | ICD-10-CM | POA: Diagnosis not present

## 2023-04-20 DIAGNOSIS — K219 Gastro-esophageal reflux disease without esophagitis: Secondary | ICD-10-CM | POA: Diagnosis present

## 2023-04-20 DIAGNOSIS — Z9221 Personal history of antineoplastic chemotherapy: Secondary | ICD-10-CM

## 2023-04-20 DIAGNOSIS — G8929 Other chronic pain: Secondary | ICD-10-CM

## 2023-04-20 DIAGNOSIS — Z91011 Allergy to milk products: Secondary | ICD-10-CM

## 2023-04-20 DIAGNOSIS — I82411 Acute embolism and thrombosis of right femoral vein: Secondary | ICD-10-CM

## 2023-04-20 DIAGNOSIS — I639 Cerebral infarction, unspecified: Secondary | ICD-10-CM | POA: Diagnosis not present

## 2023-04-20 DIAGNOSIS — I69392 Facial weakness following cerebral infarction: Secondary | ICD-10-CM

## 2023-04-20 DIAGNOSIS — R1031 Right lower quadrant pain: Secondary | ICD-10-CM | POA: Diagnosis not present

## 2023-04-20 DIAGNOSIS — K5901 Slow transit constipation: Secondary | ICD-10-CM | POA: Diagnosis not present

## 2023-04-20 DIAGNOSIS — D630 Anemia in neoplastic disease: Secondary | ICD-10-CM | POA: Diagnosis not present

## 2023-04-20 DIAGNOSIS — I69351 Hemiplegia and hemiparesis following cerebral infarction affecting right dominant side: Principal | ICD-10-CM

## 2023-04-20 DIAGNOSIS — I724 Aneurysm of artery of lower extremity: Secondary | ICD-10-CM | POA: Diagnosis not present

## 2023-04-20 DIAGNOSIS — G609 Hereditary and idiopathic neuropathy, unspecified: Secondary | ICD-10-CM | POA: Diagnosis not present

## 2023-04-20 DIAGNOSIS — Z888 Allergy status to other drugs, medicaments and biological substances status: Secondary | ICD-10-CM

## 2023-04-20 DIAGNOSIS — G629 Polyneuropathy, unspecified: Secondary | ICD-10-CM | POA: Diagnosis present

## 2023-04-20 DIAGNOSIS — Z886 Allergy status to analgesic agent status: Secondary | ICD-10-CM

## 2023-04-20 DIAGNOSIS — N179 Acute kidney failure, unspecified: Secondary | ICD-10-CM | POA: Diagnosis not present

## 2023-04-20 DIAGNOSIS — Z91040 Latex allergy status: Secondary | ICD-10-CM

## 2023-04-20 DIAGNOSIS — Z881 Allergy status to other antibiotic agents status: Secondary | ICD-10-CM

## 2023-04-20 LAB — GLUCOSE, CAPILLARY
Glucose-Capillary: 100 mg/dL — ABNORMAL HIGH (ref 70–99)
Glucose-Capillary: 101 mg/dL — ABNORMAL HIGH (ref 70–99)
Glucose-Capillary: 106 mg/dL — ABNORMAL HIGH (ref 70–99)
Glucose-Capillary: 107 mg/dL — ABNORMAL HIGH (ref 70–99)
Glucose-Capillary: 110 mg/dL — ABNORMAL HIGH (ref 70–99)
Glucose-Capillary: 111 mg/dL — ABNORMAL HIGH (ref 70–99)
Glucose-Capillary: 91 mg/dL (ref 70–99)

## 2023-04-20 LAB — CBC
HCT: 37.2 % (ref 36.0–46.0)
Hemoglobin: 12.6 g/dL (ref 12.0–15.0)
MCH: 28.9 pg (ref 26.0–34.0)
MCHC: 33.9 g/dL (ref 30.0–36.0)
MCV: 85.3 fL (ref 80.0–100.0)
Platelets: 180 10*3/uL (ref 150–400)
RBC: 4.36 MIL/uL (ref 3.87–5.11)
RDW: 14.6 % (ref 11.5–15.5)
WBC: 5 10*3/uL (ref 4.0–10.5)
nRBC: 0 % (ref 0.0–0.2)

## 2023-04-20 LAB — BASIC METABOLIC PANEL
Anion gap: 12 (ref 5–15)
BUN: 11 mg/dL (ref 6–20)
CO2: 22 mmol/L (ref 22–32)
Calcium: 9 mg/dL (ref 8.9–10.3)
Chloride: 105 mmol/L (ref 98–111)
Creatinine, Ser: 1.07 mg/dL — ABNORMAL HIGH (ref 0.44–1.00)
GFR, Estimated: >60 mL/min (ref >60)
Glucose, Bld: 101 mg/dL — ABNORMAL HIGH (ref 70–99)
Potassium: 3.3 mmol/L — ABNORMAL LOW (ref 3.5–5.1)
Sodium: 139 mmol/L (ref 135–145)

## 2023-04-20 LAB — PLATELET INHIBITION P2Y12: Platelet Function  P2Y12: 17 [PRU] — ABNORMAL LOW (ref 182–335)

## 2023-04-20 MED ORDER — EZETIMIBE 10 MG PO TABS
10.0000 mg | ORAL_TABLET | Freq: Every day | ORAL | Status: DC
  Administered 2023-04-20: 10 mg via ORAL
  Filled 2023-04-20: qty 1

## 2023-04-20 MED ORDER — HEPARIN (PORCINE) 25000 UT/250ML-% IV SOLN: 850.0000 [IU]/h | INTRAVENOUS | Status: DC

## 2023-04-20 MED ORDER — LINACLOTIDE 72 MCG PO CAPS
72.0000 ug | ORAL_CAPSULE | Freq: Every day | ORAL | Status: DC
  Administered 2023-04-21 – 2023-04-27 (×7): 72 ug via ORAL
  Filled 2023-04-20 (×7): qty 1

## 2023-04-20 MED ORDER — FLEET ENEMA RE ENEM: 1.0000 | ENEMA | Freq: Once | RECTAL | Status: DC | PRN

## 2023-04-20 MED ORDER — POLYETHYLENE GLYCOL 3350 17 G PO PACK
17.0000 g | PACK | Freq: Every day | ORAL | Status: DC
  Administered 2023-04-21: 17 g via ORAL
  Filled 2023-04-20: qty 1

## 2023-04-20 MED ORDER — CALCIUM CARBONATE ANTACID 500 MG PO CHEW
2.0000 | CHEWABLE_TABLET | Freq: Two times a day (BID) | ORAL | Status: DC | PRN
  Administered 2023-04-21 – 2023-04-29 (×3): 400 mg via ORAL
  Filled 2023-04-20 (×3): qty 2

## 2023-04-20 MED ORDER — EZETIMIBE 10 MG PO TABS: 10.0000 mg | ORAL_TABLET | Freq: Every day | ORAL | Status: DC

## 2023-04-20 MED ORDER — TICAGRELOR 90 MG PO TABS
90.0000 mg | ORAL_TABLET | Freq: Two times a day (BID) | ORAL | Status: DC
  Administered 2023-04-20 – 2023-04-25 (×10): 90 mg via ORAL
  Filled 2023-04-20 (×10): qty 1

## 2023-04-20 MED ORDER — BISACODYL 10 MG RE SUPP: 10.0000 mg | Freq: Every day | RECTAL | Status: DC | PRN

## 2023-04-20 MED ORDER — ACETAMINOPHEN 325 MG PO TABS
325.0000 mg | ORAL_TABLET | ORAL | Status: DC | PRN
  Administered 2023-04-21 (×5): 650 mg via ORAL
  Administered 2023-04-22 – 2023-04-23 (×2): 325 mg via ORAL
  Administered 2023-04-23 – 2023-04-24 (×2): 650 mg via ORAL
  Filled 2023-04-20 (×8): qty 2

## 2023-04-20 MED ORDER — ATORVASTATIN CALCIUM 80 MG PO TABS
80.0000 mg | ORAL_TABLET | Freq: Every day | ORAL | Status: DC
Start: 2023-04-21 — End: 2023-05-03
  Administered 2023-04-21 – 2023-05-02 (×12): 80 mg via ORAL
  Filled 2023-04-20 (×12): qty 1

## 2023-04-20 MED ORDER — ORAL CARE MOUTH RINSE
15.0000 mL | OROMUCOSAL | Status: DC
  Administered 2023-04-21 – 2023-05-03 (×48): 15 mL via OROMUCOSAL

## 2023-04-20 MED ORDER — HYDRALAZINE HCL 50 MG PO TABS
50.0000 mg | ORAL_TABLET | Freq: Three times a day (TID) | ORAL | Status: DC
  Administered 2023-04-20 – 2023-05-03 (×39): 50 mg via ORAL
  Filled 2023-04-20 (×39): qty 1

## 2023-04-20 MED ORDER — AMLODIPINE BESYLATE 10 MG PO TABS
10.0000 mg | ORAL_TABLET | Freq: Every day | ORAL | Status: DC
  Administered 2023-04-21 – 2023-05-03 (×13): 10 mg via ORAL
  Filled 2023-04-20 (×13): qty 1

## 2023-04-20 MED ORDER — DOCUSATE SODIUM 100 MG PO CAPS: 100.0000 mg | ORAL_CAPSULE | Freq: Every day | ORAL | Status: DC

## 2023-04-20 MED ORDER — TRAZODONE HCL 50 MG PO TABS
25.0000 mg | ORAL_TABLET | Freq: Every evening | ORAL | Status: DC | PRN
Start: 2023-04-20 — End: 2023-04-21

## 2023-04-20 MED ORDER — HEPARIN (PORCINE) 25000 UT/250ML-% IV SOLN
850.0000 [IU]/h | INTRAVENOUS | Status: DC
  Administered 2023-04-20: 850 [IU]/h via INTRAVENOUS
  Filled 2023-04-20: qty 250

## 2023-04-20 MED ORDER — PANTOPRAZOLE SODIUM 40 MG PO TBEC
40.0000 mg | DELAYED_RELEASE_TABLET | Freq: Every day | ORAL | Status: DC
  Administered 2023-04-21 – 2023-05-03 (×13): 40 mg via ORAL
  Filled 2023-04-20 (×13): qty 1

## 2023-04-20 MED ORDER — DOCUSATE SODIUM 100 MG PO CAPS
100.0000 mg | ORAL_CAPSULE | Freq: Every day | ORAL | Status: DC
  Administered 2023-04-21: 100 mg via ORAL
  Filled 2023-04-20: qty 1

## 2023-04-20 MED ORDER — SIMETHICONE 80 MG PO CHEW
80.0000 mg | CHEWABLE_TABLET | Freq: Four times a day (QID) | ORAL | Status: DC | PRN
  Administered 2023-04-21 – 2023-04-29 (×4): 80 mg via ORAL
  Filled 2023-04-20 (×4): qty 1

## 2023-04-20 MED ORDER — SENNOSIDES-DOCUSATE SODIUM 8.6-50 MG PO TABS
2.0000 | ORAL_TABLET | Freq: Every day | ORAL | Status: DC
  Administered 2023-04-21 – 2023-05-03 (×12): 2 via ORAL
  Filled 2023-04-20 (×14): qty 2

## 2023-04-20 MED ORDER — HEPARIN (PORCINE) 25000 UT/250ML-% IV SOLN
850.0000 [IU]/h | INTRAVENOUS | Status: DC
Start: 2023-04-20 — End: 2023-04-23
  Administered 2023-04-22: 550 [IU]/h via INTRAVENOUS
  Filled 2023-04-20: qty 250

## 2023-04-20 MED ORDER — TICAGRELOR 60 MG PO TABS: 60.0000 mg | ORAL_TABLET | Freq: Two times a day (BID) | ORAL | Status: DC

## 2023-04-20 MED ORDER — PROCHLORPERAZINE EDISYLATE 10 MG/2ML IJ SOLN: 5.0000 mg | Freq: Four times a day (QID) | INTRAMUSCULAR | Status: DC | PRN

## 2023-04-20 MED ORDER — ENOXAPARIN SODIUM 40 MG/0.4ML IJ SOSY: 40.0000 mg | PREFILLED_SYRINGE | INTRAMUSCULAR | Status: DC

## 2023-04-20 MED ORDER — GUAIFENESIN-DM 100-10 MG/5ML PO SYRP
5.0000 mL | ORAL_SOLUTION | Freq: Four times a day (QID) | ORAL | Status: DC | PRN
Start: 2023-04-20 — End: 2023-05-03

## 2023-04-20 MED ORDER — VITAMIN D3 25 MCG PO TABS: 1000.0000 [IU] | ORAL_TABLET | Freq: Every day | ORAL | Status: AC

## 2023-04-20 MED ORDER — GABAPENTIN 300 MG PO CAPS
300.0000 mg | ORAL_CAPSULE | Freq: Three times a day (TID) | ORAL | Status: DC
Start: 2023-04-20 — End: 2023-05-03
  Administered 2023-04-20 – 2023-05-03 (×39): 300 mg via ORAL
  Filled 2023-04-20 (×39): qty 1

## 2023-04-20 MED ORDER — DIPHENHYDRAMINE HCL 25 MG PO CAPS: 25.0000 mg | ORAL_CAPSULE | Freq: Four times a day (QID) | ORAL | Status: DC | PRN

## 2023-04-20 MED ORDER — PROCHLORPERAZINE MALEATE 5 MG PO TABS
5.0000 mg | ORAL_TABLET | Freq: Four times a day (QID) | ORAL | Status: DC | PRN
  Filled 2023-04-20: qty 1

## 2023-04-20 MED ORDER — TICAGRELOR 90 MG PO TABS: 90.0000 mg | ORAL_TABLET | Freq: Two times a day (BID) | ORAL | Status: DC

## 2023-04-20 MED ORDER — ORAL CARE MOUTH RINSE: 15.0000 mL | OROMUCOSAL | Status: DC | PRN

## 2023-04-20 MED ORDER — LINACLOTIDE 72 MCG PO CAPS
72.0000 ug | ORAL_CAPSULE | Freq: Every day | ORAL | Status: DC
  Administered 2023-04-20: 72 ug via ORAL
  Filled 2023-04-20 (×2): qty 1

## 2023-04-20 MED ORDER — PROCHLORPERAZINE 25 MG RE SUPP: 12.5000 mg | Freq: Four times a day (QID) | RECTAL | Status: DC | PRN

## 2023-04-20 MED ORDER — VITAMIN D 25 MCG (1000 UNIT) PO TABS
1000.0000 [IU] | ORAL_TABLET | Freq: Every day | ORAL | Status: DC
  Administered 2023-04-21 – 2023-05-03 (×13): 1000 [IU] via ORAL
  Filled 2023-04-20 (×13): qty 1

## 2023-04-20 NOTE — Progress Notes (Signed)
Bilateral lower extremity venous duplex has been completed.  Results can be found in chart review under CV Proc.  04/20/2023 1:40 PM  Fernande Bras, RVT.

## 2023-04-20 NOTE — Progress Notes (Signed)
PMR Admission Coordinator Pre-Admission Assessment   Patient: Brittany [name] is an 58 y.o., female MRN: [id number] DOB: 18-Aug-1965 Height: 5\' 2"  (157.5 cm) Weight: 73 kg                                                                                                                                                  Insurance Information HMO:     PPO:      PCP:      IPA:      80/20:      OTHER:  PRIMARY: [place] [employer] [facility]      Policy#: [id number]      Subscriber: pt CM Name: Burgess Amor     Phone#: [phone]     Fax#: [phone] Pre-Cert#: [id number]  auth for CIR  for 6 days from 04/20/23 with updates due on 04/25/23 to fax listed above.    Employer: Disabled Benefits:  Phone #: online     Name: online at [email or web address] Eff. Date: 09/18/22     Deduct: $0      Out of Pocket Max: $0      Life Max: n/a  CIR: 100%      SNF: 100% Outpatient: 100%     Co-Pay:  Home Health: 100%      Co-Pay: 100% DME: 100%     Co-Pay:  Providers: in network  SECONDARY:       Policy#:       Phone#:    Artist:       Phone#:    The Data processing manager" for patients in Inpatient Rehabilitation Facilities with attached "Privacy Act Statement-Health Care Records" was provided and verbally reviewed with: Patient and Family   Emergency Contact Information Contact Information       Name Relation Home Work [place] Father [phone]   [phone]    Baldwin [place] Mother [phone]        Lum Keas     [phone]         Other Contacts   None on File      Current Medical History  Patient Admitting Diagnosis: L CVA    History of Present Illness: Pt is a 58 y/o female with PMH of previous L cerebellar stroke, HTN, peripheral neuropathy, and multiple myeloma who presented to [facility] on 04/12/23 with c/o 1-2 week history of word finding deficits and dysarthria.  She was found to have subacute L MCA infarct  due to severe L MCA stenosis at the time.  On 1/27 code stroke called due to AMS with right gaze deviation and increased aphasia.  CTA revealed proximal left M1 occlusion and she underwent mechanical thrombectomy.  MRI showed infarct in the low left basal ganglia with intraparenchymal hemorrhage.  Therapy evaluations completed and pt was recommended for CIR.  Complete NIHSS TOTAL: 10 Glasgow Coma Scale Score: 11   Patient's medical record from [facility] has been reviewed by the rehabilitation admission coordinator and physician.   Past Medical History      Past Medical History:  Diagnosis Date   Anemia     Arthritis      trigger finger in left hand   Complication of anesthesia      per pt, hard to wake up!   Elevated cholesterol     Female bladder prolapse      per urologist, does not have prolaspe   GERD (gastroesophageal reflux disease)     Heart murmur      pt unsure.   Hiatal hernia     Hypertension     IBS (irritable bowel syndrome)     Multiple myeloma (HCC) 2005    had partial chemo   Peripheral neuropathy     SOB (shortness of breath) on exertion      uses an inhaler   Stroke (HCC) 2017    paralysis left arm/uses a walker          Has the patient had major surgery during 100 days prior to admission? Yes   Family History  family history includes Congestive Heart Failure in her father; Diverticulitis in her mother; Hypertension in her father and mother; Irritable bowel syndrome in her mother; Liver cancer in her mother; Sarcoidosis in her mother; Scoliosis in her brother; Stomach cancer in her father; Stroke in her maternal uncle.     Current Medications   Current Medications    Current Facility-Administered Medications:    acetaminophen (TYLENOL) tablet 650 mg, 650 mg, Oral, Q6H PRN, Violeta Gelinas, MD, 650 mg at 04/20/23 0944   amLODipine (NORVASC) tablet 10 mg, 10 mg, Oral, Daily, Violeta Gelinas, MD, 10 mg at 04/20/23 6962   aspirin chewable tablet 81  mg, 81 mg, Oral, Daily, 81 mg at 04/20/23 0924 **OR** [DISCONTINUED] aspirin chewable tablet 81 mg, 81 mg, Per Tube, Daily, Deveshwar, Sanjeev, MD, 81 mg at 04/17/23 0932   atorvastatin (LIPITOR) tablet 80 mg, 80 mg, Oral, Daily, Violeta Gelinas, MD, 80 mg at 04/20/23 9528   calcium carbonate (TUMS - dosed in mg elemental calcium) chewable tablet 400 mg of elemental calcium, 2 tablet, Oral, BID PRN, Gevena Mart A, NP, 400 mg of elemental calcium at 04/20/23 0920   Chlorhexidine Gluconate Cloth 2 % PADS 6 each, 6 each, Topical, Daily, Jefferson Fuel, MD, 6 each at 04/20/23 0957   cholecalciferol (VITAMIN D3) 25 MCG (1000 UNIT) tablet 1,000 Units, 1,000 Units, Oral, Daily, Adhikari, Amrit, MD, 1,000 Units at 04/20/23 0924   docusate sodium (COLACE) capsule 100 mg, 100 mg, Oral, Daily, Rochel Brome C, NP, 100 mg at 04/20/23 0924   enoxaparin (LOVENOX) injection 40 mg, 40 mg, Subcutaneous, Q24H, Adhikari, Amrit, MD, 40 mg at 04/20/23 4132   gabapentin (NEURONTIN) capsule 300 mg, 300 mg, Oral, TID, Violeta Gelinas, MD, 300 mg at 04/20/23 4401   hydrALAZINE (APRESOLINE) tablet 50 mg, 50 mg, Oral, Q8H, Violeta Gelinas, MD, 50 mg at 04/20/23 0272   insulin aspart (novoLOG) injection 0-6 Units, 0-6 Units, Subcutaneous, Q4H, Simonne Martinet, NP   labetalol (NORMODYNE) injection 10 mg, 10 mg, Intravenous, Q2H PRN, Pearlean Brownie, Pramod S, MD, 10 mg at 04/19/23 0503   linaclotide (LINZESS) capsule 72 mcg, 72 mcg, Oral, QAC breakfast, Marvel Plan, MD   naloxone [facility]) injection 0.4 mg, 0.4 mg, Intravenous, PRN, Howerter, Justin B, DO  ondansetron (ZOFRAN) injection 4 mg, 4 mg, Intravenous, Q6H PRN, Howerter, Justin B, DO, 4 mg at 04/20/23 0920   Oral care mouth rinse, 15 mL, Mouth Rinse, 4 times per day, Pearlean Brownie, Pramod S, MD, 15 mL at 04/20/23 0805   Oral care mouth rinse, 15 mL, Mouth Rinse, PRN, Micki Riley, MD   pantoprazole (PROTONIX) EC tablet 40 mg, 40 mg, Oral, Daily, Autry, Lauren E, PA-C, 40 mg at  04/20/23 1610   polyethylene glycol (MIRALAX / GLYCOLAX) packet 17 g, 17 g, Oral, Daily, Violeta Gelinas, MD, 17 g at 04/20/23 0920   senna (SENOKOT) tablet 8.6 mg, 1 tablet, Oral, BID, Violeta Gelinas, MD, 8.6 mg at 04/20/23 9604   ticagrelor (BRILINTA) tablet 90 mg, 90 mg, Oral, BID **OR** [DISCONTINUED] ticagrelor (BRILINTA) tablet 90 mg, 90 mg, Per Tube, BID, Deveshwar, Sanjeev, MD, 90 mg at 04/17/23 0932     Patients Current Diet:  Diet Order                  DIET DYS 3 Room service appropriate? No; Fluid consistency: Thin  Diet effective now                         Precautions / Restrictions Precautions Precautions: Fall Precaution Comments: R inattention, SBP < 180 Restrictions Weight Bearing Restrictions Per Provider Order: No    Has the patient had 2 or more falls or a fall with injury in the past year?Yes   Prior Activity Level Community (5-7x/wk): independent prior to admit,using a rollator for back pain, disabled, driving   Prior Functional Level Prior Function Prior Level of Function : Independent/Modified Independent, Driving Mobility Comments: drives, needs to use Rollator all the time due to pain in LE and decreased endurance (per mother) - however, pt states she uses a SPC for mobility. ADLs Comments: in home caregiver, 3 day/wk cooking, cleaning per mothers report   Self Care: Did the patient need help bathing, dressing, using the toilet or eating?  Independent   Indoor Mobility: Did the patient need assistance with walking from room to room (with or without device)? Independent   Stairs: Did the patient need assistance with internal or external stairs (with or without device)? Independent   Functional Cognition: Did the patient need help planning regular tasks such as shopping or remembering to take medications? Independent   Patient Information Are you of Hispanic, Latino/a,or Spanish origin?: A. No, not of Hispanic, Latino/a, or Spanish origin What  is your race?: B. Black or African American Do you need or want an interpreter to communicate with a doctor or health care staff?: 0. No   Patient's Response To:  Health Literacy and Transportation Is the patient able to respond to health literacy and transportation needs?: Yes Health Literacy - How often do you need to have someone help you when you read instructions, pamphlets, or other written material from your doctor or pharmacy?: Never In the past 12 months, has lack of transportation kept you from medical appointments or from getting medications?: No In the past 12 months, has lack of transportation kept you from meetings, work, or from getting things needed for daily living?: No   Home Assistive Devices / Equipment Home Equipment: Rollator (4 wheels), BSC/3in1   Prior Device Use: Indicate devices/aids used by the patient prior to current illness, exacerbation or injury? Walker   Current Functional Level Cognition   Arousal/Alertness: Awake/alert Overall Cognitive Status: Difficult to assess Difficult to  assess due to: Impaired communication Orientation Level: Other (comment) (UTA) General Comments: Inconsistent with yes/no responses, bright affect and smiling with achievement of walking today. Visibly frustrated when not able to get words out or communicate needs. responding well to gestures for mobility Attention: Sustained Sustained Attention: Impaired Sustained Attention Impairment: Verbal basic, Functional basic (R inattention/neglect) Awareness: Appears intact Behaviors: Restless, Verbal agitation, Perseveration    Extremity Assessment (includes Sensation/Coordination)   Upper Extremity Assessment: Generalized weakness RUE Deficits / Details: no active RUE movement, flaccid, no pain or light touch response LUE Deficits / Details: Seemingly WFL with observed functional tasks. difficult to fully assess due to impaired cognition  Lower Extremity Assessment: Defer to PT  evaluation RLE Deficits / Details: 1/5 hip flexors and knee extensors, otherwise no active motion noted. no pain or light touch response RLE Coordination: decreased gross motor, decreased fine motor LLE Deficits / Details: ROM WFL, strength grossly 3+/5, LLE Coordination: decreased fine motor     ADLs   Overall ADL's : Needs assistance/impaired Eating/Feeding: Independent Eating/Feeding Details (indicate cue type and reason): eating in long sitting upon arrival, no difficulties with FM or dexterity Grooming: Supervision/safety, Standing Upper Body Bathing: Set up, Sitting Lower Body Bathing: Contact guard assist, Sit to/from stand Upper Body Dressing : Set up, Sitting Lower Body Dressing: Contact guard assist, Sit to/from stand Toilet Transfer: +2 for physical assistance, Moderate assistance Toileting- Clothing Manipulation and Hygiene: Supervision/safety, Sitting/lateral lean Toileting - Clothing Manipulation Details (indicate cue type and reason): unable to void Functional mobility during ADLs: Contact guard assist, Rolling walker (2 wheels) General ADL Comments: limited session secondary to pain     Mobility   Overal bed mobility: Needs Assistance Bed Mobility: Rolling, Sidelying to Sit, Sit to Sidelying Rolling: Contact guard assist Sidelying to sit: Contact guard assist Supine to sit: HOB elevated, Contact guard Sit to supine: Min assist, HOB elevated Sit to sidelying: Supervision General bed mobility comments: CGA for safety, cues for hand placement to pull on L bed rail, exited and sat eob on L side of bed. able to maintain un supported sitting while having BP taken. no lob.  pt. put self back in bed in L sidelying without cueing secondary to pain.  session ended, RN called from room and upated on pts. pain     Transfers   Overall transfer level: Needs assistance Equipment used: 1 person hand held assist Transfers: Sit to/from Stand Sit to Stand: Min assist, Contact guard  assist Bed to/from chair/wheelchair/BSC transfer type:: Stand pivot Stand pivot transfers: Mod assist, +2 physical assistance General transfer comment: Light minA to rise up from edge of bed and initially steady, able to complete x5 at end of session with CGA for safety     Ambulation / Gait / Stairs / Wheelchair Mobility   Ambulation/Gait Ambulation/Gait assistance: Min assist, Mod assist Gait Distance (Feet): 100 Feet (x2) Assistive device: 1 person hand held assist Gait Pattern/deviations: Step-through pattern, Decreased dorsiflexion - right, Decreased step length - right General Gait Details: HHA on L with min A to steady increasing to mod A with fatigue and increased distance, decreasing RLE clearance, step length, and weight shift to R with increased distance and fatigue Gait velocity: decreased Gait velocity interpretation: <1.31 ft/sec, indicative of household ambulator     Posture / Balance Balance Overall balance assessment: Needs assistance Sitting-balance support: No upper extremity supported, Feet supported Sitting balance-Leahy Scale: Good Standing balance support: Bilateral upper extremity supported, During functional activity Standing balance-Leahy Scale: Poor  Standing balance comment: reliant on external support     Special needs/care consideration Diabetic management yes        Previous Home Environment (from acute therapy documentation) Living Arrangements: Alone  Lives With: Alone Available Help at Discharge: Family, Personal care attendant, Available PRN/intermittently Type of Home: House Home Layout: One level Home Access: Level entry Bathroom Shower/Tub: Engineer, manufacturing systems: Handicapped height Bathroom Accessibility: Yes Home Care Services: Yes Type of Home Care Services: Homehealth aide Additional Comments: pt's son is in [place], elderly parents are in [place]. Pt has a PCA.   Discharge Living Setting Plans for Discharge Living Setting: House,  Lives with (comment) (Plans home with parents initially) Type of Home at Discharge: House [facility]) Discharge Home Layout: One level Discharge Home Access: Level entry Discharge Bathroom Shower/Tub: Tub/shower unit, Door Discharge Bathroom Toilet: Handicapped height Discharge Bathroom Accessibility: Yes How Accessible: Accessible via wheelchair, Accessible via walker Does the patient have any problems obtaining your medications?: No   Social/Family/Support Systems Patient Roles: Parents, has son and dtr in law in [place] Information: Baldwin [place] - mother - [phone] Anticipated Caregiver: parents Ramon Dredge and Baldwin [place], local) and son Maryelizabeth Rowan, [place])  Apolinar Junes can be reached at [phone] and Dtr in [place] is [employer] Anticipated Caregiver's Contact Information: see contacts Ability/Limitations of Caregiver: Parents are disabled and using walkers but can provide supervision Caregiver Availability: 24/7 Discharge Plan Discussed with Primary Caregiver: Yes Is Caregiver In Agreement with Plan?: Yes Does Caregiver/Family have Issues with Lodging/Transportation while Pt is in Rehab?: No     Goals Patient/Family Goal for Rehab: PT/OT supervision to mod I, SLP min assist Expected length of stay: 7-10 days Additional Information: Plan is to go live with son Apolinar Junes and his wife in [place], but may have to DC to parents home initially. Pt/Family Agrees to Admission and willing to participate: Yes Program Orientation Provided & Reviewed with Pt/Caregiver Including Roles  & Responsibilities: Yes     Decrease burden of Care through IP rehab admission: n/a     Possible need for SNF placement upon discharge: Not anticipated.      Patient Condition: This patient's medical and functional status has changed since the consult dated: 04/19/23 in which the Rehabilitation Physician determined and documented that the patient's condition is appropriate for intensive rehabilitative  care in an inpatient rehabilitation facility. See "History of Present Illness" (above) for medical update. Functional changes are: Requiring min to mod assist to ambulate 100' X2. Patient's medical and functional status update has been discussed with the Rehabilitation physician and patient remains appropriate for inpatient rehabilitation. Will admit to inpatient rehab today.   Preadmission Screen Completed By:  Stephania Fragmin, PT,  DPT with updates by Roderic Palau, RN 04/20/2023 1:04 PM ______________________________________________________________________   Discussed status with Dr. Riley Kill on 04/20/23 at 0915 and received approval for admission today.   Admission Coordinator:  Trish Mage, time1306/Date1/31/25           Cosigned by: Angelina Sheriff, DO at 04/20/2023  1:12 PM   Revision History

## 2023-04-20 NOTE — Progress Notes (Signed)
Physical Medicine and Rehabilitation Consult Reason for Consult:functional/language deficits after stroke Referring Physician: Pearlean Brownie     HPI: Brittany [name] is a 58 y.o. female with a history of previous left cerebellar stroke, hypertension, peripheral neuropathy, multiple myeloma who initially presented on 04/12/2023 with 1 to 2 weeks of word finding deficits and dysarthria.  She was found to have a left MCA infarct due to severe left MCA stenosis.  She was transferred to [facility] for further workup.  On 127 she was found to have acute onset of right gaze deviation and more significant aphasia.  CT angiogram revealed proximal left M1 occlusion.  She underwent mechanical thrombectomy.  MRI of the brain revealed infarct in the low left basal ganglia with intraparenchymal hemorrhage.  Patient was up with therapy yesterday and was min assist for sit to stand transfers and min assist 60 feet plus two assist/hand-held assist.  Therapy reports problems with right foot clearance and knee buckling.     Review of Systems  Unable to perform ROS: Language        Past Medical History:  Diagnosis Date   Anemia     Arthritis      trigger finger in left hand   Complication of anesthesia      per pt, hard to wake up!   Elevated cholesterol     Female bladder prolapse      per urologist, does not have prolaspe   GERD (gastroesophageal reflux disease)     Heart murmur      pt unsure.   Hiatal hernia     Hypertension     IBS (irritable bowel syndrome)     Multiple myeloma (HCC) 2005    had partial chemo   Peripheral neuropathy     SOB (shortness of breath) on exertion      uses an inhaler   Stroke (HCC) 2017    paralysis left arm/uses a walker             Past Surgical History:  Procedure Laterality Date   BONE BIOPSY   2005    in her back   COLONOSCOPY        over 10 years x3   ESOPHAGOGASTRODUODENOSCOPY        incomplete-over 10 years ago    IR CT HEAD LTD   04/16/2023   IR CT  HEAD LTD   04/16/2023   IR INTRA CRAN STENT   04/16/2023   IR PERCUTANEOUS ART THROMBECTOMY/INFUSION INTRACRANIAL INC DIAG ANGIO   04/16/2023   RADIOLOGY WITH ANESTHESIA N/A 04/16/2023    Procedure: IR WITH ANESTHESIA;  Surgeon: Julieanne Cotton, MD;  Location: [facility] OR;  Service: Radiology;  Laterality: N/A;   TEE WITHOUT CARDIOVERSION N/A 12/08/2016    Procedure: TRANSESOPHAGEAL ECHOCARDIOGRAM (TEE);  Surgeon: Quintella Reichert, MD;  Location: [facility] ENDOSCOPY;  Service: Cardiovascular;  Laterality: N/A;   TUBAL LIGATION       UPPER GASTROINTESTINAL ENDOSCOPY       WISDOM TOOTH EXTRACTION                 Family History  Problem Relation Age of Onset   Hypertension Mother     Sarcoidosis Mother          currently in remission    Diverticulitis Mother     Irritable bowel syndrome Mother     Liver cancer Mother     Hypertension Father     Stomach cancer Father     Congestive Heart  Failure Father     Stroke Maternal Uncle     Scoliosis Brother     Colon cancer Neg Hx     Esophageal cancer Neg Hx     Colon polyps Neg Hx     Rectal cancer Neg Hx          Social History:  reports that she quit smoking about 7 years ago. Her smoking use included cigarettes. She has never used smokeless tobacco. She reports that she does not currently use alcohol. She reports that she does not currently use drugs after having used the following drugs: Marijuana. Allergies:  Allergies       Allergies  Allergen Reactions   Amitriptyline Other (See Comments)      Patient reported that it made her throat feel like its locking up and it also caused issues with her going to the bathroom   Duloxetine Other (See Comments)      Patient reported that it made her throat lock up and it caused her to have issue with going to the bathroom   Naproxen        Vomiting, sweating, abd spasms   Other        States can't take pain meds that end in "cet" or meds that end in "ine" Darvocet/severe vomiting   Beef-Derived Drug  Products        Patient has IBS prefers no beef   Pork-Derived Products        Patient has IBS prefers no pork   Darvon [Propoxyphene] Nausea And Vomiting   Hydrocodone Nausea And Vomiting   Lactose Intolerance (Gi)        Bloating, gas, abd pain   Latex Itching and Rash   Oxycodone Nausea And Vomiting   Percocet [Oxycodone-Acetaminophen] Nausea And Vomiting   Topamax [Topiramate]        Memory made her emotional              Medications Prior to Admission  Medication Sig Dispense Refill   albuterol (VENTOLIN HFA) 108 (90 Base) MCG/ACT inhaler Inhale 2 puffs into the lungs every 4 (four) hours as needed for wheezing or shortness of breath. 8.5 g 11   atorvastatin (LIPITOR) 80 MG tablet Take 1 tablet (80 mg total) by mouth daily. 90 tablet 3   gabapentin (NEURONTIN) 300 MG capsule Take 1 capsule (300 mg total) by mouth 3 (three) times daily. 90 capsule 3   hydrALAZINE (APRESOLINE) 50 MG tablet Take 1 tablet (50 mg total) by mouth every 8 (eight) hours. 90 tablet 0   hydrochlorothiazide (HYDRODIURIL) 12.5 MG tablet Take 12.5 mg by mouth daily.       linaclotide (LINZESS) 72 MCG capsule Take 1 capsule (72 mcg total) by mouth daily before breakfast. 90 capsule 1   losartan (COZAAR) 50 MG tablet Take 1 tablet (50 mg total) by mouth daily. 30 tablet 5   omeprazole (PRILOSEC) 20 MG capsule Take 1 capsule (20 mg total) by mouth 2 (two) times daily before a meal. 180 capsule 3   amLODipine (NORVASC) 10 MG tablet Take 1 tablet (10 mg total) by mouth daily. (Patient not taking: Reported on 04/12/2023) 30 tablet 0   chlorthalidone (HYGROTON) 25 MG tablet Take 1 tablet (25 mg total) by mouth daily. (Patient not taking: Reported on 04/12/2023) 30 tablet 0   triamcinolone cream (KENALOG) 0.1 % APPLY 1 APPLICATION TOPICALLY 2 (TWO) TIMES DAILY. (Patient not taking: Reported on 04/12/2023) 454 g 2  Home: Home Living Family/patient expects to be discharged to:: Private residence Living  Arrangements: Alone Available Help at Discharge: Family, Personal care attendant, Available PRN/intermittently Type of Home: House Home Access: Level entry Home Layout: One level Bathroom Shower/Tub: Engineer, manufacturing systems: Handicapped height Bathroom Accessibility: Yes Home Equipment: Rollator (4 wheels), BSC/3in1 Additional Comments: pt's son is in [place], elderly parents are in [place]. Pt has a PCA.  Lives With: Alone  Functional History: Prior Function Prior Level of Function : Independent/Modified Independent, Driving Mobility Comments: drives, needs to use Rollator all the time due to pain in LE and decreased endurance (per mother) - however, pt states she uses a SPC for mobility. ADLs Comments: in home caregiver, 3 day/wk cooking, cleaning per mothers report Functional Status:  Mobility: Bed Mobility Overal bed mobility: Needs Assistance Bed Mobility: Supine to Sit Supine to sit: Mod assist Sit to supine: Contact guard assist General bed mobility comments: CGA for safety, exiting towards left side of bed Transfers Overall transfer level: Needs assistance Equipment used: 2 person hand held assist Transfers: Sit to/from Stand Sit to Stand: Min assist, +2 safety/equipment Bed to/from chair/wheelchair/BSC transfer type:: Stand pivot Stand pivot transfers: Mod assist, +2 physical assistance General transfer comment: Light minA to rise up from edge of bed and initially steady Ambulation/Gait Ambulation/Gait assistance: Min assist, +2 safety/equipment Gait Distance (Feet): 60 Feet Assistive device: 2 person hand held assist Gait Pattern/deviations: Step-through pattern, Decreased dorsiflexion - right, Decreased step length - right General Gait Details: Decreased R foot clearance, weight shift and step length. MinA + 2 for balance via HHA, no R knee buckle noted. Gait velocity: decreased Gait velocity interpretation: <1.31 ft/sec, indicative of household ambulator    ADL: ADL Overall ADL's : Needs assistance/impaired Eating/Feeding: Independent Eating/Feeding Details (indicate cue type and reason): eating in long sitting upon arrival, no difficulties with FM or dexterity Grooming: Supervision/safety, Standing Upper Body Bathing: Set up, Sitting Lower Body Bathing: Contact guard assist, Sit to/from stand Upper Body Dressing : Set up, Sitting Lower Body Dressing: Contact guard assist, Sit to/from stand Toilet Transfer: +2 for physical assistance, Moderate assistance Toileting- Clothing Manipulation and Hygiene: Supervision/safety, Sitting/lateral lean Toileting - Clothing Manipulation Details (indicate cue type and reason): unable to void Functional mobility during ADLs: Contact guard assist, Rolling walker (2 wheels) General ADL Comments: pt limited by expressive communication and impulsivity. CGA required for safety with cues   Cognition: Cognition Overall Cognitive Status: Difficult to assess Arousal/Alertness: Awake/alert Orientation Level: Oriented to person, Oriented to situation, Oriented to place, Disoriented to time Attention: Sustained Sustained Attention: Impaired Sustained Attention Impairment: Verbal basic, Functional basic (R inattention/neglect) Awareness: Appears intact Behaviors: Restless, Verbal agitation, Perseveration Cognition Arousal: Alert Behavior During Therapy: WFL for tasks assessed/performed Overall Cognitive Status: Difficult to assess Area of Impairment: Memory Memory:  (decreased memory of PLOF) General Comments: Inconsistent with yes/no responses, bright affect and smiling with achievement of walking today. Visibly frustrated when not able to get words out or communicate needs. Able to wash hands at sink but needed cueing for using soap and drying. Difficult to assess due to: Impaired communication   Blood pressure (!) 143/71, pulse 72, temperature 98.1 F (36.7 C), temperature source Oral, resp. rate 17, height  5\' 2"  (1.575 m), weight 73 kg, SpO2 100%. Physical Exam Constitutional:      General: She is not in acute distress. HENT:     Head: Normocephalic and atraumatic.     Right Ear: External ear normal.  Left Ear: External ear normal.     Nose: Nose normal.     Mouth/Throat:     Mouth: Mucous membranes are moist.  Eyes:     Conjunctiva/sclera: Conjunctivae normal.  Cardiovascular:     Rate and Rhythm: Normal rate.  Pulmonary:     Effort: Pulmonary effort is normal.  Abdominal:     Palpations: Abdomen is soft.  Musculoskeletal:        General: Tenderness (right shoulder with IR/ER/impingement maneuver) present.     Cervical back: Normal range of motion.  Skin:    General: Skin is warm and dry.  Neurological:     Mental Status: She is alert.     Comments: Pt is alert.  She makes eye contact with the examiner.  No focal visual abnormalities.  May have mild right facial weakness.  Demonstrates comprehension for simple yes no questions but has difficulty with simple problem-solving and following steps during manual muscle testing.  May have an element of apraxia.  She was nonverbal.  Right upper extremity appears grossly 4 out of 5 proximal distal with some inhibition due to pain at the shoulder.  Right lower extremity is similar at at least 4 - to 4 out of 5.  Left upper extremity and left lower extremity move freely.  She seems to sense pain in all 4 limbs.  Deep tendon reflexes are 1+.  Toes were down.  Psychiatric:     Comments: Patient was pleasant and appeared generally upbeat.  Perhaps a bit anxious        Lab Results Last 24 Hours       Results for orders placed or performed during the hospital encounter of 04/12/23 (from the past 24 hours)  Glucose, capillary     Status: Abnormal    Collection Time: 04/18/23 11:44 AM  Result Value Ref Range    Glucose-Capillary 102 (H) 70 - 99 mg/dL  Glucose, capillary     Status: Abnormal    Collection Time: 04/18/23  4:20 PM  Result Value  Ref Range    Glucose-Capillary 103 (H) 70 - 99 mg/dL  Glucose, capillary     Status: Abnormal    Collection Time: 04/18/23  7:38 PM  Result Value Ref Range    Glucose-Capillary 115 (H) 70 - 99 mg/dL  Glucose, capillary     Status: Abnormal    Collection Time: 04/18/23 11:36 PM  Result Value Ref Range    Glucose-Capillary 120 (H) 70 - 99 mg/dL  Glucose, capillary     Status: None    Collection Time: 04/19/23  3:44 AM  Result Value Ref Range    Glucose-Capillary 97 70 - 99 mg/dL  Glucose, capillary     Status: Abnormal    Collection Time: 04/19/23  7:23 AM  Result Value Ref Range    Glucose-Capillary 110 (H) 70 - 99 mg/dL    Comment 1 Notify RN         Imaging Results (Last 48 hours)  MR BRAIN WO CONTRAST Result Date: 04/18/2023 CLINICAL DATA:  Stroke, follow up; Neuro deficit, acute, stroke suspected EXAM: MRI HEAD WITHOUT CONTRAST MRA HEAD WITHOUT CONTRAST TECHNIQUE: Multiplanar, multi-echo pulse sequences of the brain and surrounding structures were acquired without intravenous contrast. Angiographic images of the Circle of Willis were acquired using MRA technique without intravenous contrast. COMPARISON:  CT head 04/16/2023. CTA head/neck 04/16/2023. FINDINGS: MRI HEAD FINDINGS Brain: Acute left MCA territory infarct which involves the left basal ganglia, overlying left frontal lobe,  left parietal lobe and anterior left temporal lobe. Intraparenchymal hemorrhage centered in the left basal ganglia appears similar when comparing across modalities to recent CT head. Similar mild rightward midline shift. No visible mass lesion. No hydrocephalus. Remote left cerebellar infarcts. Vascular: See below. Skull and upper cervical spine: Normal marrow signal. Sinuses/Orbits: Mild paranasal sinus mucosal thickening. No acute orbital findings. Other: No mastoid effusions. MRA HEAD FINDINGS Anterior circulation: Bilateral intracranial ICAs, MCAs and ACAs are patent without proximal hemodynamically  significant stenosis. There is a stent in the left M1 MCA which obscures the vessel in this region. No aneurysm identified. Posterior circulation: Bilateral intradural vertebral arteries, basilar artery and bilateral posterior cerebral arteries are patent without proximal hemodynamically significant stenosis. No aneurysm identified. IMPRESSION: 1. Acute left MCA territory infarct with similar intraparenchymal hemorrhage centered in the left basal ganglia when comparing across modalities to recent CT head. Similar mild rightward midline shift. 2. Left M1 MCA is now patent.  No large vessel occlusion. Electronically Signed   By: Feliberto Harts M.D.   On: 04/18/2023 01:28    MR ANGIO HEAD WO CONTRAST Result Date: 04/18/2023 CLINICAL DATA:  Stroke, follow up; Neuro deficit, acute, stroke suspected EXAM: MRI HEAD WITHOUT CONTRAST MRA HEAD WITHOUT CONTRAST TECHNIQUE: Multiplanar, multi-echo pulse sequences of the brain and surrounding structures were acquired without intravenous contrast. Angiographic images of the Circle of Willis were acquired using MRA technique without intravenous contrast. COMPARISON:  CT head 04/16/2023. CTA head/neck 04/16/2023. FINDINGS: MRI HEAD FINDINGS Brain: Acute left MCA territory infarct which involves the left basal ganglia, overlying left frontal lobe, left parietal lobe and anterior left temporal lobe. Intraparenchymal hemorrhage centered in the left basal ganglia appears similar when comparing across modalities to recent CT head. Similar mild rightward midline shift. No visible mass lesion. No hydrocephalus. Remote left cerebellar infarcts. Vascular: See below. Skull and upper cervical spine: Normal marrow signal. Sinuses/Orbits: Mild paranasal sinus mucosal thickening. No acute orbital findings. Other: No mastoid effusions. MRA HEAD FINDINGS Anterior circulation: Bilateral intracranial ICAs, MCAs and ACAs are patent without proximal hemodynamically significant stenosis. There is  a stent in the left M1 MCA which obscures the vessel in this region. No aneurysm identified. Posterior circulation: Bilateral intradural vertebral arteries, basilar artery and bilateral posterior cerebral arteries are patent without proximal hemodynamically significant stenosis. No aneurysm identified. IMPRESSION: 1. Acute left MCA territory infarct with similar intraparenchymal hemorrhage centered in the left basal ganglia when comparing across modalities to recent CT head. Similar mild rightward midline shift. 2. Left M1 MCA is now patent.  No large vessel occlusion. Electronically Signed   By: Feliberto Harts M.D.   On: 04/18/2023 01:28    DG Abd Portable 1V Result Date: 04/17/2023 CLINICAL DATA:  Bowel impaction EXAM: PORTABLE ABDOMEN - 1 VIEW COMPARISON:  04/16/2023 FINDINGS: The patient is rotated to the right on today's radiograph, reducing diagnostic sensitivity and specificity. Previous nasogastric tube no longer visible along the abdomen. No visible feeding tube. There is scattered gas in the colon without a substantial burden of colonic stool. No dilated small bowel is identified. IMPRESSION: 1. No substantial burden of colonic stool is identified. 2. Previous nasogastric tube no longer visible along the abdomen. Electronically Signed   By: Gaylyn Rong M.D.   On: 04/17/2023 18:43       Assessment/Plan: Diagnosis: 58 year old female with left MCA territory infarct status post thrombectomy with subsequent mild right hemiparesis but but with profound aphasia expressive greater than receptive.  History of previous left  cerebellar infarct Does the need for close, 24 hr/day medical supervision in concert with the patient's rehab needs make it unreasonable for this patient to be served in a less intensive setting? Yes Co-Morbidities requiring supervision/potential complications:  -Hypertension -Diabetes type 2 -Dysphagia -?right rotator cuff injury -History of peripheral  neuropathy -History of IBS -History of multiple myeloma Due to bladder management, bowel management, safety, skin/wound care, disease management, medication administration, pain management, and patient education, does the patient require 24 hr/day rehab nursing? Yes Does the patient require coordinated care of a physician, rehab nurse, therapy disciplines of PT, OT, SLP to address physical and functional deficits in the context of the above medical diagnosis(es)? Yes Addressing deficits in the following areas: balance, endurance, locomotion, strength, transferring, bowel/bladder control, bathing, dressing, feeding, grooming, toileting, cognition, speech, language, swallowing, and psychosocial support Can the patient actively participate in an intensive therapy program of at least 3 hrs of therapy per day at least 5 days per week? Yes The potential for patient to make measurable gains while on inpatient rehab is excellent Anticipated functional outcomes upon discharge from inpatient rehab are supervision  with PT, supervision with OT, supervision and min assist with SLP. Estimated rehab length of stay to reach the above functional goals is: 7-10 days Anticipated discharge destination: Home Overall Rehab/Functional Prognosis: excellent   POST ACUTE RECOMMENDATIONS: This patient's condition is appropriate for continued rehabilitative care in the following setting:  CIR, see below Patient has agreed to participate in recommended program. Yes Note that insurance prior authorization may be required for reimbursement for recommended care.   Comment: Patient has potential to make substantive gains on inpatient rehab from a mobility and safety standpoint in addition to language.  She will need help at home due to her aphasia.  It is unclear if she has surrounding supports to provide that. Rehab Admissions Coordinator to follow up.       MEDICAL RECOMMENDATIONS: A/PROM exercises RUE with OT or PT. Would  benefit from topical voltaren,  heating pad or cool compresses to help with pain.      I have personally performed a face to face diagnostic evaluation of this patient. Additionally, I have examined the patient's medical record including any pertinent labs and radiographic images. If the physician assistant has documented in this note, I have reviewed and edited or otherwise concur with the physician assistant's documentation.   Thanks,   Ranelle Oyster, MD 04/19/2023          Routing History

## 2023-04-20 NOTE — Progress Notes (Signed)
Speech Language Pathology Treatment: Cognitive-Linquistic  Patient Details Name: Brittany [name] MRN: [id number] DOB: 24-Dec-1965 Today's Date: 04/20/2023 Time: 1050-1059 SLP Time Calculation (min) (ACUTE ONLY): 9 min  Assessment / Plan / Recommendation Clinical Impression  Pt was seen for skilled ST targeting aphasia.  Per RN, pt with significant abdominal pain secondary to IBS flare up prior to session; therefore session was abbreviated on this date.  Pt was agreeable to session and was able to participate in melodic intonation tasks and automatic speech tasks.  She was able sing along with "Happy Birthday" and "Amazing Brittany [name]" with approximately 40%-50% accuracy.  She was unable to repeat her name or other short words with melodic intonation on this date.  Yes/No and picture communication board was presented in this session; however, pt was unable to correctly identify yes/no words or pictures, even when field was limited to two choices.  SLP will continue to f/u for treatment targeting language deficits acutely, and recommend additional speech therapy at time of discharge.     HPI HPI: Pt is a 58 y.o. female with medical history significant of stroke, hypertension, hyperlipidemia, obesity who presented at [facility] from home with complaint of difficulty with her speech on 01/23. CT angiogram showed areas of hypodensity in the left cerebral hemisphere. Patient was transferred to [facility] for further workup with MRI and neurology evaluation. MRI confirmed acute/subacute infarcts on the left cerebral hemisphere consistent with left MCA territory stroke. Pt recently received thrombectomy and stent placement in left MCA due to decreased consciousness and right side weakness on 01/27, leading to intubation that ended on 01/28. PMHx significant of stroke, hypertension, hyperlipidemia, obesity; ST assessed on 04/13/23 with dx of aphasia; BSE completed with recs for Dysphagia 3/thin liquids.  ST f/u for aphasia  tx.      SLP Plan  Continue with current plan of care      Recommendations for follow up therapy are one component of a multi-disciplinary discharge planning process, led by the attending physician.  Recommendations may be updated based on patient status, additional functional criteria and insurance authorization.    Recommendations                     Oral care BID   Frequent or constant Supervision/Assistance Cognitive communication deficit (R41.841);Aphasia (R47.01)     Continue with current plan of care    Brittany [name], M.S., CCC-SLP Acute Rehabilitation Services Office: [phone]  Brittany [name] [facility]  04/20/2023, 11:50 AM

## 2023-04-20 NOTE — TOC Transition Note (Signed)
Transition of Care [facility]) - Discharge Note   Patient Details  Name: Brittany [name] MRN: [id number] Date of Birth: Mar 14, 1966  [facility] CM/SW Contact:  Kermit Balo, RN Phone Number: 04/20/2023, 3:43 PM   Clinical Narrative:     Pt is discharging to CIR today. CM signing off.   SDOH Interventions Today    Flowsheet Row Most Recent Value  SDOH Interventions   Food Insecurity Interventions [id number] Referral, Inpatient TOC  Transportation Interventions Inpatient TOC  Utilities Interventions [id number] Referral, Inpatient TOC        Final next level of care: IP Rehab Facility Barriers to Discharge: No Barriers Identified   Patient Goals and CMS Choice            Discharge Placement                       Discharge Plan and Services Additional resources added to the After Visit Summary for                                       Social Drivers of Health (SDOH) Interventions SDOH Screenings   Food Insecurity: Food Insecurity Present (04/12/2023)  Housing: High Risk (04/12/2023)  Transportation Needs: Unmet Transportation Needs (04/12/2023)  Utilities: At Risk (04/12/2023)  Alcohol Screen: Low Risk  (02/22/2021)  Depression (PHQ2-9): Low Risk  (01/06/2021)  Tobacco Use: Medium Risk (04/12/2023)     Readmission Risk Interventions     No data to display

## 2023-04-20 NOTE — Progress Notes (Signed)
PHARMACY - ANTICOAGULATION CONSULT NOTE  Pharmacy Consult for Heparin Indication: RLE DVT with stroke and PFO   Allergies  Allergen Reactions   Amitriptyline Other (See Comments)    Patient reported that it made her throat feel like its locking up and it also caused issues with her going to the bathroom   Duloxetine Other (See Comments)    Patient reported that it made her throat lock up and it caused her to have issue with going to the bathroom   Naproxen     Vomiting, sweating, abd spasms   Other     States can't take pain meds that end in "cet" or meds that end in "ine" Darvocet/severe vomiting   Beef-Derived Drug Products     Patient has IBS prefers no beef   Pork-Derived Products     Patient has IBS prefers no pork   Darvon [Propoxyphene] Nausea And Vomiting   Hydrocodone Nausea And Vomiting   Lactose Intolerance (Gi)     Bloating, gas, abd pain   Latex Itching and Rash   Oxycodone Nausea And Vomiting   Percocet [Oxycodone-Acetaminophen] Nausea And Vomiting   Topamax [Topiramate]     Memory made her emotional     Patient Measurements: Height: 5\' 2"  (157.5 cm) Weight: 73 kg (160 lb 15 oz) IBW/kg (Calculated) : 50.1 Heparin Dosing Weight: 67 kg  Vital Signs: Temp: 98 F (36.7 C) (01/31 1241) Temp Source: Oral (01/31 1241) BP: 122/67 (01/31 1241) Pulse Rate: 99 (01/31 1241)  Labs: Recent Labs    04/18/23 0610 04/20/23 0704  HGB 11.0* 12.6  HCT 33.1* 37.2  PLT 148* 180  CREATININE 1.22* 1.07*    Estimated Creatinine Clearance: 54.3 mL/min (A) (by C-G formula based on SCr of 1.07 mg/dL (H)).   Medical History: Past Medical History:  Diagnosis Date   Anemia    Arthritis    trigger finger in left hand   Complication of anesthesia    per pt, hard to wake up!   Elevated cholesterol    Female bladder prolapse    per urologist, does not have prolaspe   GERD (gastroesophageal reflux disease)    Heart murmur    pt unsure.   Hiatal hernia     Hypertension    IBS (irritable bowel syndrome)    Multiple myeloma (HCC) 2005   had partial chemo   Peripheral neuropathy    SOB (shortness of breath) on exertion    uses an inhaler   Stroke (HCC) 2017   paralysis left arm/uses a walker    Medications:  Medications Prior to Admission  Medication Sig Dispense Refill Last Dose/Taking   albuterol (VENTOLIN HFA) 108 (90 Base) MCG/ACT inhaler Inhale 2 puffs into the lungs every 4 (four) hours as needed for wheezing or shortness of breath. 8.5 g 11 Taking As Needed   atorvastatin (LIPITOR) 80 MG tablet Take 1 tablet (80 mg total) by mouth daily. 90 tablet 3 Taking   gabapentin (NEURONTIN) 300 MG capsule Take 1 capsule (300 mg total) by mouth 3 (three) times daily. 90 capsule 3 Past Week   hydrALAZINE (APRESOLINE) 50 MG tablet Take 1 tablet (50 mg total) by mouth every 8 (eight) hours. 90 tablet 0 Taking   hydrochlorothiazide (HYDRODIURIL) 12.5 MG tablet Take 12.5 mg by mouth daily.   Taking   linaclotide (LINZESS) 72 MCG capsule Take 1 capsule (72 mcg total) by mouth daily before breakfast. 90 capsule 1 04/11/2023   losartan (COZAAR) 50 MG tablet Take 1  tablet (50 mg total) by mouth daily. 30 tablet 5 Taking   omeprazole (PRILOSEC) 20 MG capsule Take 1 capsule (20 mg total) by mouth 2 (two) times daily before a meal. 180 capsule 3 Taking   amLODipine (NORVASC) 10 MG tablet Take 1 tablet (10 mg total) by mouth daily. (Patient not taking: Reported on 04/12/2023) 30 tablet 0 Not Taking   chlorthalidone (HYGROTON) 25 MG tablet Take 1 tablet (25 mg total) by mouth daily. (Patient not taking: Reported on 04/12/2023) 30 tablet 0 Not Taking   triamcinolone cream (KENALOG) 0.1 % APPLY 1 APPLICATION TOPICALLY 2 (TWO) TIMES DAILY. (Patient not taking: Reported on 04/12/2023) 454 g 2 Not Taking    Assessment: 58 y.o. F presented with stroke. Now pt found to have RLE DVT. Pt on Lovenox 40mg  daily - last dose 0925. To begin heparin per pharmacy low dose goal. No  AC PTA. CBC stable.  Goal of Therapy:  Heparin level 0.3-0.5 units/ml Monitor platelets by anticoagulation protocol: Yes   Plan:  D/c Lovenox Heparin gtt at 850 units/hr Will f/u heparin level in 8 hours Daily heparin level and CBC  Christoper Fabian, PharmD, BCPS Please see amion for complete clinical pharmacist phone list 04/20/2023,3:03 PM

## 2023-04-20 NOTE — Progress Notes (Signed)
IP rehab admissions - I called mom since patient cannot speak with me today due to aphasia.  Mom says patient can come home with her and dad initially, but they are disabled and cannot do lifting.  They can provide supervision.  Plan is to go to [place] with her son Apolinar Junes and his wife once patient can travel.  We do have insurance approval.  Bed available and will admit to CIR today.  Call for questions.  [phone]

## 2023-04-20 NOTE — H&P (Shared)
Physical Medicine and Rehabilitation Admission H&P    Chief Complaint  Patient presents with   Functional deficits.     HPI:  Brittany [name] is a 58 year old female with history of HTN, GERD, anemia, MM, peripheral neuropathy, IBS, DOE, Left cerebellar CVA;   who was originally admitted on 04/12/23 with  reports of 1-2 weeks of speech changes and decline in cognition.  UDS positive for THC.  CT head done revealing subacute stroke in L-MCA territory and CTA revealed short segment occlusion in proximal Left M2 and additional severe stenosis in insular left M2. MRI brain done revealing  scattered acute and subacute infarcts involving L-MCA distribution, few scattered superimposed subacute to chronic L-MCA infarct and chronic left cerebellar infarct. Neurology recommended DAPT X 3 weeks followed by ASA alone as well as zio patch for stroke felt to bed due to large vessel disease v/s cardioembolic v/s hypercoagulopathy. Not felt to be TNK candidate due to duration of symptoms.   Expressive deficits were improving but on 01/27, patient developed global aphasia with right facial droop and right sided weakness, left gaze preference and right hemianopia.  Follow up CTA showed proximal Left M1 occlusion with minimal collaterization of L-MCA which had progressed from 01/23. She underwent cerebral angio with mechanical thrombectomy of occluded L-MCA M1 segment with stent placement by Dr. Corliss Skains with TIC! 3 revascularization.  Post procedure on ASA/Brilinta bid   2D echo with EF 60-65% with severe LVH, moderately dilated left atria. BSE done and patient started on D3, thins. Noted to have abdominal pain today limiting activity question IBS flare v/s constipation? She does have hx of small PFO noted on TEE in the past and BLE dopplers done which was positive for acute DVT in R-CFV and SF junction. Results dicussed with Dr. Roda Shutters who recommended IV heparin X 2 days and if stable to transition to DOAC--no  limitation inactivity.   Patient with resultant right sided weakness with sensory deficits, right inattention due to hemiopia, global aphasia and fatigue affecting mobility, ADLs and communication. CIR recommended due to functional decline.      Review of Systems  Unable to perform ROS: Language  Gastrointestinal:  Positive for abdominal pain.     Past Medical History:  Diagnosis Date   Anemia    Arthritis    trigger finger in left hand   Complication of anesthesia    per pt, hard to wake up!   Elevated cholesterol    Female bladder prolapse    per urologist, does not have prolaspe   GERD (gastroesophageal reflux disease)    Heart murmur    pt unsure.   Hiatal hernia    Hypertension    IBS (irritable bowel syndrome)    Multiple myeloma (HCC) 2005   had partial chemo   Peripheral neuropathy    SOB (shortness of breath) on exertion    uses an inhaler   Stroke (HCC) 2017   paralysis left arm/uses a walker    Past Surgical History:  Procedure Laterality Date   BONE BIOPSY  2005   in her back   COLONOSCOPY     over 10 years x3   ESOPHAGOGASTRODUODENOSCOPY     incomplete-over 10 years ago    IR CT HEAD LTD  04/16/2023   IR CT HEAD LTD  04/16/2023   IR INTRA CRAN STENT  04/16/2023   IR PERCUTANEOUS ART THROMBECTOMY/INFUSION INTRACRANIAL INC DIAG ANGIO  04/16/2023   RADIOLOGY WITH ANESTHESIA N/A 04/16/2023  Procedure: IR WITH ANESTHESIA;  Surgeon: Julieanne Cotton, MD;  Location: [facility] OR;  Service: Radiology;  Laterality: N/A;   TEE WITHOUT CARDIOVERSION N/A 12/08/2016   Procedure: TRANSESOPHAGEAL ECHOCARDIOGRAM (TEE);  Surgeon: Quintella Reichert, MD;  Location: [facility] ENDOSCOPY;  Service: Cardiovascular;  Laterality: N/A;   TUBAL LIGATION     UPPER GASTROINTESTINAL ENDOSCOPY     WISDOM TOOTH EXTRACTION      Family History  Problem Relation Age of Onset   Hypertension Mother    Sarcoidosis Mother        currently in remission    Diverticulitis Mother    Irritable bowel  syndrome Mother    Liver cancer Mother    Hypertension Father    Stomach cancer Father    Congestive Heart Failure Father    Stroke Maternal Uncle    Scoliosis Brother    Colon cancer Neg Hx    Esophageal cancer Neg Hx    Colon polyps Neg Hx    Rectal cancer Neg Hx     Social History:  reports that she quit smoking about 7 years ago. Her smoking use included cigarettes. She has never used smokeless tobacco. She reports that she does not currently use alcohol. She reports that she does not currently use drugs after having used the following drugs: Marijuana.   Allergies  Allergen Reactions   Amitriptyline Other (See Comments)    Patient reported that it made her throat feel like its locking up and it also caused issues with her going to the bathroom   Duloxetine Other (See Comments)    Patient reported that it made her throat lock up and it caused her to have issue with going to the bathroom   Naproxen     Vomiting, sweating, abd spasms   Other     States can't take pain meds that end in "cet" or meds that end in "ine" Darvocet/severe vomiting   Beef-Derived Drug Products     Patient has IBS prefers no beef   Pork-Derived Products     Patient has IBS prefers no pork   Darvon [Propoxyphene] Nausea And Vomiting   Hydrocodone Nausea And Vomiting   Lactose Intolerance (Gi)     Bloating, gas, abd pain   Latex Itching and Rash   Oxycodone Nausea And Vomiting   Percocet [Oxycodone-Acetaminophen] Nausea And Vomiting   Topamax [Topiramate]     Memory made her emotional     Medications Prior to Admission  Medication Sig Dispense Refill   albuterol (VENTOLIN HFA) 108 (90 Base) MCG/ACT inhaler Inhale 2 puffs into the lungs every 4 (four) hours as needed for wheezing or shortness of breath. 8.5 g 11   atorvastatin (LIPITOR) 80 MG tablet Take 1 tablet (80 mg total) by mouth daily. 90 tablet 3   gabapentin (NEURONTIN) 300 MG capsule Take 1 capsule (300 mg total) by mouth 3 (three)  times daily. 90 capsule 3   hydrALAZINE (APRESOLINE) 50 MG tablet Take 1 tablet (50 mg total) by mouth every 8 (eight) hours. 90 tablet 0   hydrochlorothiazide (HYDRODIURIL) 12.5 MG tablet Take 12.5 mg by mouth daily.     linaclotide (LINZESS) 72 MCG capsule Take 1 capsule (72 mcg total) by mouth daily before breakfast. 90 capsule 1   losartan (COZAAR) 50 MG tablet Take 1 tablet (50 mg total) by mouth daily. 30 tablet 5   omeprazole (PRILOSEC) 20 MG capsule Take 1 capsule (20 mg total) by mouth 2 (two) times daily before a meal.  180 capsule 3   amLODipine (NORVASC) 10 MG tablet Take 1 tablet (10 mg total) by mouth daily. (Patient not taking: Reported on 04/12/2023) 30 tablet 0   chlorthalidone (HYGROTON) 25 MG tablet Take 1 tablet (25 mg total) by mouth daily. (Patient not taking: Reported on 04/12/2023) 30 tablet 0   triamcinolone cream (KENALOG) 0.1 % APPLY 1 APPLICATION TOPICALLY 2 (TWO) TIMES DAILY. (Patient not taking: Reported on 04/12/2023) 454 g 2      Home: Home Living Family/patient expects to be discharged to:: Private residence Living Arrangements: Alone Available Help at Discharge: Family, Personal care attendant, Available PRN/intermittently Type of Home: House Home Access: Level entry Home Layout: One level Bathroom Shower/Tub: Engineer, manufacturing systems: Handicapped height Bathroom Accessibility: Yes Home Equipment: Rollator (4 wheels), BSC/3in1 Additional Comments: pt's son is in [place], elderly parents are in [place]. Pt has a PCA.  Lives With: Alone   Functional History: Prior Function Prior Level of Function : Independent/Modified Independent, Driving Mobility Comments: drives, needs to use Rollator all the time due to pain in LE and decreased endurance (per mother) - however, pt states she uses a SPC for mobility. ADLs Comments: in home caregiver, 3 day/wk cooking, cleaning per mothers report  Functional Status:  Mobility: Bed Mobility Overal bed mobility: Needs  Assistance Bed Mobility: Rolling, Sidelying to Sit, Sit to Sidelying Rolling: Contact guard assist Sidelying to sit: Contact guard assist Supine to sit: HOB elevated, Contact guard Sit to supine: Min assist, HOB elevated Sit to sidelying: Supervision General bed mobility comments: CGA for safety, cues for hand placement to pull on L bed rail, exited and sat eob on L side of bed. able to maintain un supported sitting while having BP taken. no lob.  pt. put self back in bed in L sidelying without cueing secondary to pain.  session ended, RN called from room and upated on pts. pain Transfers Overall transfer level: Needs assistance Equipment used: 1 person hand held assist Transfers: Sit to/from Stand Sit to Stand: Min assist, Contact guard assist Bed to/from chair/wheelchair/BSC transfer type:: Stand pivot Stand pivot transfers: Mod assist, +2 physical assistance General transfer comment: Light minA to rise up from edge of bed and initially steady, able to complete x5 at end of session with CGA for safety Ambulation/Gait Ambulation/Gait assistance: Min assist, Mod assist Gait Distance (Feet): 100 Feet (x2) Assistive device: 1 person hand held assist Gait Pattern/deviations: Step-through pattern, Decreased dorsiflexion - right, Decreased step length - right General Gait Details: HHA on L with min A to steady increasing to mod A with fatigue and increased distance, decreasing RLE clearance, step length, and weight shift to R with increased distance and fatigue Gait velocity: decreased Gait velocity interpretation: <1.31 ft/sec, indicative of household ambulator    ADL: ADL Overall ADL's : Needs assistance/impaired Eating/Feeding: Independent Eating/Feeding Details (indicate cue type and reason): eating in long sitting upon arrival, no difficulties with FM or dexterity Grooming: Supervision/safety, Standing Upper Body Bathing: Set up, Sitting Lower Body Bathing: Contact guard assist, Sit  to/from stand Upper Body Dressing : Set up, Sitting Lower Body Dressing: Contact guard assist, Sit to/from stand Toilet Transfer: +2 for physical assistance, Moderate assistance Toileting- Clothing Manipulation and Hygiene: Supervision/safety, Sitting/lateral lean Toileting - Clothing Manipulation Details (indicate cue type and reason): unable to void Functional mobility during ADLs: Contact guard assist, Rolling walker (2 wheels) General ADL Comments: limited session secondary to pain  Cognition: Cognition Overall Cognitive Status: Difficult to assess Arousal/Alertness: Awake/alert Orientation Level:  Other (comment) (UTA) Attention: Sustained Sustained Attention: Impaired Sustained Attention Impairment: Verbal basic, Functional basic (R inattention/neglect) Awareness: Appears intact Behaviors: Restless, Verbal agitation, Perseveration Cognition Arousal: Alert Behavior During Therapy: WFL for tasks assessed/performed Overall Cognitive Status: Difficult to assess Area of Impairment: Memory Memory:  (decreased memory of PLOF) General Comments: Inconsistent with yes/no responses, bright affect and smiling with achievement of walking today. Visibly frustrated when not able to get words out or communicate needs. responding well to gestures for mobility Difficult to assess due to: Impaired communication  Physical Exam: Blood pressure (!) 149/83, pulse 93, temperature (!) 100.7 F (38.2 C), temperature source Oral, resp. rate 18, height 5\' 2"  (1.575 m), weight 73 kg, SpO2 97%. Physical Exam Abdominal:     Palpations: Abdomen is soft.     Tenderness: There is abdominal tenderness.     Comments: RLQ painful and unable to sit up due to discomfort.   Neurological:     Comments: Right inattention. Minimal verbal output. Communication limited to nods/shakes and grimacing. She was able to follow occasional simple motor command with verbal and tactile cues. Right sided weakness--RLE>RUE.       Results for orders placed or performed during the hospital encounter of 04/12/23 (from the past 48 hours)  Glucose, capillary     Status: Abnormal   Collection Time: 04/18/23  4:20 PM  Result Value Ref Range   Glucose-Capillary 103 (H) 70 - 99 mg/dL    Comment: Glucose reference range applies only to samples taken after fasting for at least 8 hours.  Glucose, capillary     Status: Abnormal   Collection Time: 04/18/23  7:38 PM  Result Value Ref Range   Glucose-Capillary 115 (H) 70 - 99 mg/dL    Comment: Glucose reference range applies only to samples taken after fasting for at least 8 hours.  Glucose, capillary     Status: Abnormal   Collection Time: 04/18/23 11:36 PM  Result Value Ref Range   Glucose-Capillary 120 (H) 70 - 99 mg/dL    Comment: Glucose reference range applies only to samples taken after fasting for at least 8 hours.  Glucose, capillary     Status: None   Collection Time: 04/19/23  3:44 AM  Result Value Ref Range   Glucose-Capillary 97 70 - 99 mg/dL    Comment: Glucose reference range applies only to samples taken after fasting for at least 8 hours.  Glucose, capillary     Status: Abnormal   Collection Time: 04/19/23  7:23 AM  Result Value Ref Range   Glucose-Capillary 110 (H) 70 - 99 mg/dL    Comment: Glucose reference range applies only to samples taken after fasting for at least 8 hours.   Comment 1 Notify RN   Glucose, capillary     Status: None   Collection Time: 04/19/23 12:11 PM  Result Value Ref Range   Glucose-Capillary 99 70 - 99 mg/dL    Comment: Glucose reference range applies only to samples taken after fasting for at least 8 hours.   Comment 1 Notify RN   Glucose, capillary     Status: Abnormal   Collection Time: 04/19/23  3:13 PM  Result Value Ref Range   Glucose-Capillary 125 (H) 70 - 99 mg/dL    Comment: Glucose reference range applies only to samples taken after fasting for at least 8 hours.   Comment 1 Notify RN   Glucose, capillary      Status: Abnormal   Collection Time: 04/19/23  7:55  PM  Result Value Ref Range   Glucose-Capillary 137 (H) 70 - 99 mg/dL    Comment: Glucose reference range applies only to samples taken after fasting for at least 8 hours.  Glucose, capillary     Status: Abnormal   Collection Time: 04/20/23 12:22 AM  Result Value Ref Range   Glucose-Capillary 110 (H) 70 - 99 mg/dL    Comment: Glucose reference range applies only to samples taken after fasting for at least 8 hours.  Glucose, capillary     Status: Abnormal   Collection Time: 04/20/23  4:27 AM  Result Value Ref Range   Glucose-Capillary 106 (H) 70 - 99 mg/dL    Comment: Glucose reference range applies only to samples taken after fasting for at least 8 hours.  Basic metabolic panel     Status: Abnormal   Collection Time: 04/20/23  7:04 AM  Result Value Ref Range   Sodium 139 135 - 145 mmol/L   Potassium 3.3 (L) 3.5 - 5.1 mmol/L   Chloride 105 98 - 111 mmol/L   CO2 22 22 - 32 mmol/L   Glucose, Bld 101 (H) 70 - 99 mg/dL    Comment: Glucose reference range applies only to samples taken after fasting for at least 8 hours.   BUN 11 6 - 20 mg/dL   Creatinine, Ser 4.01 (H) 0.44 - 1.00 mg/dL   Calcium 9.0 8.9 - 02.7 mg/dL   GFR, Estimated >25 >36 mL/min    Comment: (NOTE) Calculated using the CKD-EPI Creatinine Equation (2021)    Anion gap 12 5 - 15    Comment: Performed at [facility], [address] [place], [place] [address]  CBC     Status: None   Collection Time: 04/20/23  7:04 AM  Result Value Ref Range   WBC 5.0 4.0 - 10.5 K/uL   RBC 4.36 3.87 - 5.11 MIL/uL   Hemoglobin 12.6 12.0 - 15.0 g/dL   HCT 47.4 25.9 - 56.3 %   MCV 85.3 80.0 - 100.0 fL   MCH 28.9 26.0 - 34.0 pg   MCHC 33.9 30.0 - 36.0 g/dL   RDW 87.5 64.3 - 32.9 %   Platelets 180 150 - 400 K/uL   nRBC 0.0 0.0 - 0.2 %    Comment: Performed at [facility], [address] [place], [place] [address]  Glucose, capillary     Status: Abnormal   Collection  Time: 04/20/23  7:39 AM  Result Value Ref Range   Glucose-Capillary 100 (H) 70 - 99 mg/dL    Comment: Glucose reference range applies only to samples taken after fasting for at least 8 hours.  Platelet inhibition p2y12 (not at [facility])     Status: Abnormal   Collection Time: 04/20/23 11:08 AM  Result Value Ref Range   Platelet Function  P2Y12 17 (L) 182 - 335 PRU    Comment: (NOTE) The literature has shown a direct correlation of PRU values over 230 with higher risks of thrombotic events. Lower PRU values are associated with platelet inhibition. Performed at [facility], [address], [place], [place] [address]    No results found.    Blood pressure (!) 149/83, pulse 93, temperature (!) 100.7 F (38.2 C), temperature source Oral, resp. rate 18, height 5\' 2"  (1.575 m), weight 73 kg, SpO2 97%.  Medical Problem List and Plan: 1. Functional deficits secondary to ***  -patient may *** shower  -ELOS/Goals: *** 2.  R-CFV DVT/Antithrombotics: -  DVT/anticoagulation:  Pharmaceutical: Heparin low dose X 48 hours and if stable transition to DOAC  -antiplatelet therapy: Brillinta. ASA d/c due to addition of Heparin.  3. Pain Management:  Tylenol prn.  4. Mood/Behavior/Sleep: LCSW to follow for evaluation and support.   -antipsychotic agents: N/A 5. Neuropsych/cognition: This patient is not capable of making decisions on her own behalf. 6. Skin/Wound Care: Routine pressure relief measures.  7. Fluids/Electrolytes/Nutrition: Monitor I/O. Check CMET in am.  8. HTN: Monitor BP TID--continue amlodipine and hydrochlorothiazide  --avoid hypoperfusion.  9. RLQ pain:  Felt to be due to IBS.Has been on miralax and Senna BID without results --Linzess was started today. Will get KUB  10. Hypokalemia: Supplement added X 2 days.  11. Hyperlipidemia: LDL-124 on Lipitor 80 mg and Zetia.  12. Pre-diabetes: Hgb A1c- 5.9 and every 4 hours CBS have shown BS controlled. --Will monitor fasting BS on  serial labs.  13. Substance abuse: Educated on stroke risk w/marijuana.  14. H/o MM: Acute renal failure improving SCr 1.11 @ admission-->1.59-->1.07 15. GERD: On protonix   ***  Jacquelynn Cree, PA-C 04/20/2023

## 2023-04-20 NOTE — Progress Notes (Signed)
PHARMACY - ANTICOAGULATION CONSULT NOTE  Pharmacy Consult for Heparin Indication: RLE DVT with stroke and PFO   Allergies  Allergen Reactions   Amitriptyline Other (See Comments)    Patient reported that it made her throat feel like its locking up and it also caused issues with her going to the bathroom   Duloxetine Other (See Comments)    Patient reported that it made her throat lock up and it caused her to have issue with going to the bathroom   Naproxen     Vomiting, sweating, abd spasms   Other     States can't take pain meds that end in "cet" or meds that end in "ine" Darvocet/severe vomiting   Beef-Derived Drug Products     Patient has IBS prefers no beef   Pork-Derived Products     Patient has IBS prefers no pork   Darvon [Propoxyphene] Nausea And Vomiting   Hydrocodone Nausea And Vomiting   Lactose Intolerance (Gi)     Bloating, gas, abd pain   Latex Itching and Rash   Oxycodone Nausea And Vomiting   Percocet [Oxycodone-Acetaminophen] Nausea And Vomiting   Topamax [Topiramate]     Memory made her emotional     Patient Measurements: Weight: 67.4 kg (148 lb 9.4 oz) Heparin Dosing Weight: 67 kg  Vital Signs: Temp: 99 F (37.2 C) (01/31 1821) Temp Source: Oral (01/31 1821) BP: 140/81 (01/31 1821) Pulse Rate: 107 (01/31 1821)  Labs: Recent Labs    04/18/23 0610 04/20/23 0704  HGB 11.0* 12.6  HCT 33.1* 37.2  PLT 148* 180  CREATININE 1.22* 1.07*    Estimated Creatinine Clearance: 52.2 mL/min (A) (by C-G formula based on SCr of 1.07 mg/dL (H)).   Medical History: Past Medical History:  Diagnosis Date   Anemia    Arthritis    trigger finger in left hand   Complication of anesthesia    per pt, hard to wake up!   Elevated cholesterol    Female bladder prolapse    per urologist, does not have prolaspe   GERD (gastroesophageal reflux disease)    Heart murmur    pt unsure.   Hiatal hernia    Hypertension    IBS (irritable bowel syndrome)    Multiple  myeloma (HCC) 2005   had partial chemo   Peripheral neuropathy    SOB (shortness of breath) on exertion    uses an inhaler   Stroke (HCC) 2017   paralysis left arm/uses a walker    Medications:  Medications Prior to Admission  Medication Sig Dispense Refill Last Dose/Taking   amLODipine (NORVASC) 10 MG tablet Take 1 tablet (10 mg total) by mouth daily. (Patient not taking: Reported on 04/12/2023) 30 tablet 0    atorvastatin (LIPITOR) 80 MG tablet Take 1 tablet (80 mg total) by mouth daily. 90 tablet 3    [START ON 04/21/2023] cholecalciferol (CHOLECALCIFEROL) 25 MCG tablet Take 1 tablet (1,000 Units total) by mouth daily.      [START ON 04/21/2023] docusate sodium (COLACE) 100 MG capsule Take 1 capsule (100 mg total) by mouth daily.      ezetimibe (ZETIA) 10 MG tablet Take 1 tablet (10 mg total) by mouth daily.      gabapentin (NEURONTIN) 300 MG capsule Take 1 capsule (300 mg total) by mouth 3 (three) times daily. 90 capsule 3    heparin 84696 UT/250ML infusion Inject 850 Units/hr into the vein continuous.      hydrALAZINE (APRESOLINE) 50 MG tablet Take  1 tablet (50 mg total) by mouth every 8 (eight) hours. 90 tablet 0    linaclotide (LINZESS) 72 MCG capsule Take 1 capsule (72 mcg total) by mouth daily before breakfast. 90 capsule 1    ticagrelor (BRILINTA) 90 MG TABS tablet Take 1 tablet (90 mg total) by mouth 2 (two) times daily.       Assessment: 58 y.o. F presented with stroke. Now pt found to have RLE DVT. Pt on Lovenox 40mg  daily - last dose 0925. To begin heparin per pharmacy low dose goal. No AC PTA. CBC stable.   1/31 PM: Patient transferred to CIR. Continue same rate and goals   Goal of Therapy:  Heparin level 0.3-0.5 units/ml Monitor platelets by anticoagulation protocol: Yes   Plan:  Heparin gtt at 850 units/hr Will f/u heparin level in 8 hours Daily heparin level and CBC  Sebastiano Luecke BS, PharmD, BCPS Clinical Pharmacist 04/20/2023 6:23 PM  Contact: [phone]  after 3 PM  "Be curious, not judgmental..." -Debbora Dus

## 2023-04-20 NOTE — Progress Notes (Signed)
Occupational Therapy Treatment Patient Details Name: Brittany [name] MRN: [id number] DOB: 08/15/1965 Today's Date: 04/20/2023   History of present illness 58 yo female presents to ED on 1/23 with AMS, slurred speech. MRI 1/23 revealed scattered acute to early subacute ischemic infarcts involving L cerebral hemisphere, L MCA distribution. Rapid response called 1/27 for AMS, R weakness; s/p L arteriogram via R CFA approach for occluded L M1 segment on 1/27. ETT 1/27-1/28. PHMx: Anemia, arthritis, elevated cholesterol, GERD, heart murmur, female bladder prolapse, IBS, hiatal hernia, stroke, multiple myeloma, SOB, and peripheral neuropathy.   OT comments  Pt. Seen for skilled OT treatment session.  Pt. Pointing to lower L/R abdomen c/o pain but agreeable to try tx session.  Able to roll into L sidelying with CGA and cues for hand placement on bed rail.  Sidelying to sit with CGA.  Brief un supported sitting while taking BP no LOB.  Pt. Returned to sidelying without assistance and session ended as pt. Was signaling and moaning in pain from lower abdomen.  RN notified of seated BP and also pt.s pain progression. Will cont. With acute OT POC.    BP: supine/sit 174/76 (102) P102      If plan is discharge home, recommend the following:  A little help with walking and/or transfers;A little help with bathing/dressing/bathroom;Assistance with cooking/housework;Direct supervision/assist for medications management;Direct supervision/assist for financial management;Assist for transportation;Help with stairs or ramp for entrance   Equipment Recommendations  None recommended by OT    Recommendations for Other Services Rehab consult    Precautions / Restrictions Precautions Precautions: Fall Precaution Comments: R inattention, SBP < 180       Mobility Bed Mobility Overal bed mobility: Needs Assistance Bed Mobility: Rolling, Sidelying to Sit, Sit to Sidelying Rolling: Contact guard  assist Sidelying to sit: Contact guard assist     Sit to sidelying: Supervision General bed mobility comments: CGA for safety, cues for hand placement to pull on L bed rail, exited and sat eob on L side of bed. able to maintain un supported sitting while having BP taken. no lob.  pt. put self back in bed in L sidelying without cueing secondary to pain.  session ended, RN called from room and upated on pts. pain    Transfers                         Balance                                           ADL either performed or assessed with clinical judgement   ADL                                         General ADL Comments: limited session secondary to pain    Extremity/Trunk Assessment              Vision       Perception     Praxis      Cognition   Behavior During Therapy: [facility] for tasks assessed/performed  Exercises      Shoulder Instructions       General Comments      Pertinent Vitals/ Pain       Pain Assessment Pain Assessment: Faces Faces Pain Scale: Hurts whole lot Pain Location: pt. pointing to L/R lower abdomen Pain Descriptors / Indicators: Moaning, Grimacing, Guarding Pain Intervention(s): Limited activity within patient's tolerance, Monitored during session, Repositioned, Patient requesting pain meds-RN notified  Home Living                                          Prior Functioning/Environment              Frequency  Min 1X/week        Progress Toward Goals  OT Goals(current goals can now be found in the care plan section)  Progress towards OT goals: Progressing toward goals     Plan      Co-evaluation                 AM-PAC OT "6 Clicks" Daily Activity     Outcome Measure   Help from another person eating meals?: A Lot Help from another person taking care of personal grooming?: A Lot Help  from another person toileting, which includes using toliet, bedpan, or urinal?: A Lot Help from another person bathing (including washing, rinsing, drying)?: A Lot Help from another person to put on and taking off regular upper body clothing?: A Lot Help from another person to put on and taking off regular lower body clothing?: A Lot 6 Click Score: 12    End of Session    OT Visit Diagnosis: Unsteadiness on feet (R26.81);Other abnormalities of gait and mobility (R26.89);Muscle weakness (generalized) (M62.81);Cognitive communication deficit (R41.841) Symptoms and signs involving cognitive functions: Cerebral infarction   Activity Tolerance Patient limited by pain   Patient Left in bed;with call bell/phone within reach;with bed alarm set   Nurse Communication Other (comment) (rn states ok to work with pt, reviewed need to keep BP below 180.  alerted rn from pts. room regarding pts. pain in lower abdomen and pt. unable to continue with therapy session)        Time: 1610-9604 OT Time Calculation (min): 8 min  Charges: OT General Charges $OT Visit: 1 Visit OT Treatments $Therapeutic Activity: 8-22 mins  Boneta Lucks, COTA/L Acute Rehabilitation [phone]   Alessandra Bevels Lorraine-COTA/L 04/20/2023, 9:06 AM

## 2023-04-20 NOTE — H&P (Signed)
Physical Medicine and Rehabilitation Admission H&P        Chief Complaint  Patient presents with   Functional deficits.       HPI:  Brittany [name] is a 58 year old female with history of HTN, GERD, anemia, MM, peripheral neuropathy, IBS, DOE, Left cerebellar CVA;   who was originally admitted on 04/12/23 with  reports of 1-2 weeks of speech changes and decline in cognition.  UDS positive for THC.  CT head done revealing subacute stroke in L-MCA territory and CTA revealed short segment occlusion in proximal Left M2 and additional severe stenosis in insular left M2. MRI brain done revealing  scattered acute and subacute infarcts involving L-MCA distribution, few scattered superimposed subacute to chronic L-MCA infarct and chronic left cerebellar infarct. Neurology recommended DAPT X 3 weeks followed by ASA alone as well as zio patch for stroke felt to bed due to large vessel disease v/s cardioembolic v/s hypercoagulopathy. Not felt to be TNK candidate due to duration of symptoms.    Expressive deficits were improving but on 01/27, patient developed global aphasia with right facial droop and right sided weakness, left gaze preference and right hemianopia.  Follow up CTA showed proximal Left M1 occlusion with minimal collaterization of L-MCA which had progressed from 01/23. She underwent cerebral angio with mechanical thrombectomy of occluded L-MCA M1 segment with stent placement by Dr. Corliss Skains with TIC! 3 revascularization.  Post procedure on ASA/Brilinta bid    2D echo with EF 60-65% with severe LVH, moderately dilated left atria. BSE done and patient started on D3, thins. Noted to have abdominal pain today limiting activity question IBS flare v/s constipation? She does have hx of small PFO noted on TEE in the past and BLE dopplers done which was positive for acute DVT in R-CFV and SF junction. Results dicussed with Dr. Roda Shutters who recommended IV heparin X 2 days and if stable to transition to  DOAC--no limitation inactivity.    Patient with resultant right sided weakness with sensory deficits, right inattention due to hemiopia, global aphasia and fatigue affecting mobility, ADLs and communication. CIR recommended due to functional decline.       Review of Systems  Unable to perform ROS: Language  Gastrointestinal:  Positive for abdominal pain.            Past Medical History:  Diagnosis Date   Anemia     Arthritis      trigger finger in left hand   Complication of anesthesia      per pt, hard to wake up!   Elevated cholesterol     Female bladder prolapse      per urologist, does not have prolaspe   GERD (gastroesophageal reflux disease)     Heart murmur      pt unsure.   Hiatal hernia     Hypertension     IBS (irritable bowel syndrome)     Multiple myeloma (HCC) 2005    had partial chemo   Peripheral neuropathy     SOB (shortness of breath) on exertion      uses an inhaler   Stroke [facility]) 2017    paralysis left arm/uses a walker               Past Surgical History:  Procedure Laterality Date   BONE BIOPSY   2005    in her back   COLONOSCOPY        over 10 years x3   ESOPHAGOGASTRODUODENOSCOPY  incomplete-over 10 years ago    IR CT HEAD LTD   04/16/2023   IR CT HEAD LTD   04/16/2023   IR INTRA CRAN STENT   04/16/2023   IR PERCUTANEOUS ART THROMBECTOMY/INFUSION INTRACRANIAL INC DIAG ANGIO   04/16/2023   RADIOLOGY WITH ANESTHESIA N/A 04/16/2023    Procedure: IR WITH ANESTHESIA;  Surgeon: Julieanne Cotton, MD;  Location: [facility] OR;  Service: Radiology;  Laterality: N/A;   TEE WITHOUT CARDIOVERSION N/A 12/08/2016    Procedure: TRANSESOPHAGEAL ECHOCARDIOGRAM (TEE);  Surgeon: Quintella Reichert, MD;  Location: [facility] ENDOSCOPY;  Service: Cardiovascular;  Laterality: N/A;   TUBAL LIGATION       UPPER GASTROINTESTINAL ENDOSCOPY       WISDOM TOOTH EXTRACTION                   Family History  Problem Relation Age of Onset   Hypertension Mother     Sarcoidosis  Mother          currently in remission    Diverticulitis Mother     Irritable bowel syndrome Mother     Liver cancer Mother     Hypertension Father     Stomach cancer Father     Congestive Heart Failure Father     Stroke Maternal Uncle     Scoliosis Brother     Colon cancer Neg Hx     Esophageal cancer Neg Hx     Colon polyps Neg Hx     Rectal cancer Neg Hx            Social History:  reports that she quit smoking about 7 years ago. Her smoking use included cigarettes. She has never used smokeless tobacco. She reports that she does not currently use alcohol. She reports that she does not currently use drugs after having used the following drugs: Marijuana.     Allergies       Allergies  Allergen Reactions   Amitriptyline Other (See Comments)      Patient reported that it made her throat feel like its locking up and it also caused issues with her going to the bathroom   Duloxetine Other (See Comments)      Patient reported that it made her throat lock up and it caused her to have issue with going to the bathroom   Naproxen        Vomiting, sweating, abd spasms   Other        States can't take pain meds that end in "cet" or meds that end in "ine" Darvocet/severe vomiting   Beef-Derived Drug Products        Patient has IBS prefers no beef   Pork-Derived Products        Patient has IBS prefers no pork   Darvon [Propoxyphene] Nausea And Vomiting   Hydrocodone Nausea And Vomiting   Lactose Intolerance (Gi)        Bloating, gas, abd pain   Latex Itching and Rash   Oxycodone Nausea And Vomiting   Percocet [Oxycodone-Acetaminophen] Nausea And Vomiting   Topamax [Topiramate]        Memory made her emotional                Medications Prior to Admission  Medication Sig Dispense Refill   albuterol (VENTOLIN HFA) 108 (90 Base) MCG/ACT inhaler Inhale 2 puffs into the lungs every 4 (four) hours as needed for wheezing or shortness of breath. 8.5 g 11   atorvastatin (LIPITOR) 80  MG tablet Take 1 tablet (80 mg total) by mouth daily. 90 tablet 3   gabapentin (NEURONTIN) 300 MG capsule Take 1 capsule (300 mg total) by mouth 3 (three) times daily. 90 capsule 3   hydrALAZINE (APRESOLINE) 50 MG tablet Take 1 tablet (50 mg total) by mouth every 8 (eight) hours. 90 tablet 0   hydrochlorothiazide (HYDRODIURIL) 12.5 MG tablet Take 12.5 mg by mouth daily.       linaclotide (LINZESS) 72 MCG capsule Take 1 capsule (72 mcg total) by mouth daily before breakfast. 90 capsule 1   losartan (COZAAR) 50 MG tablet Take 1 tablet (50 mg total) by mouth daily. 30 tablet 5   omeprazole (PRILOSEC) 20 MG capsule Take 1 capsule (20 mg total) by mouth 2 (two) times daily before a meal. 180 capsule 3   amLODipine (NORVASC) 10 MG tablet Take 1 tablet (10 mg total) by mouth daily. (Patient not taking: Reported on 04/12/2023) 30 tablet 0   chlorthalidone (HYGROTON) 25 MG tablet Take 1 tablet (25 mg total) by mouth daily. (Patient not taking: Reported on 04/12/2023) 30 tablet 0   triamcinolone cream (KENALOG) 0.1 % APPLY 1 APPLICATION TOPICALLY 2 (TWO) TIMES DAILY. (Patient not taking: Reported on 04/12/2023) 454 g 2              Home: Home Living Family/patient expects to be discharged to:: Private residence Living Arrangements: Alone Available Help at Discharge: Family, Personal care attendant, Available PRN/intermittently Type of Home: House Home Access: Level entry Home Layout: One level Bathroom Shower/Tub: Engineer, manufacturing systems: Handicapped height Bathroom Accessibility: Yes Home Equipment: Rollator (4 wheels), BSC/3in1 Additional Comments: pt's son is in [place], elderly parents are in [place]. Pt has a PCA.  Lives With: Alone   Functional History: Prior Function Prior Level of Function : Independent/Modified Independent, Driving Mobility Comments: drives, needs to use Rollator all the time due to pain in LE and decreased endurance (per mother) - however, pt states she uses a SPC for  mobility. ADLs Comments: in home caregiver, 3 day/wk cooking, cleaning per mothers report   Functional Status:  Mobility: Bed Mobility Overal bed mobility: Needs Assistance Bed Mobility: Rolling, Sidelying to Sit, Sit to Sidelying Rolling: Contact guard assist Sidelying to sit: Contact guard assist Supine to sit: HOB elevated, Contact guard Sit to supine: Min assist, HOB elevated Sit to sidelying: Supervision General bed mobility comments: CGA for safety, cues for hand placement to pull on L bed rail, exited and sat eob on L side of bed. able to maintain un supported sitting while having BP taken. no lob.  pt. put self back in bed in L sidelying without cueing secondary to pain.  session ended, RN called from room and upated on pts. pain Transfers Overall transfer level: Needs assistance Equipment used: 1 person hand held assist Transfers: Sit to/from Stand Sit to Stand: Min assist, Contact guard assist Bed to/from chair/wheelchair/BSC transfer type:: Stand pivot Stand pivot transfers: Mod assist, +2 physical assistance General transfer comment: Light minA to rise up from edge of bed and initially steady, able to complete x5 at end of session with CGA for safety Ambulation/Gait Ambulation/Gait assistance: Min assist, Mod assist Gait Distance (Feet): 100 Feet (x2) Assistive device: 1 person hand held assist Gait Pattern/deviations: Step-through pattern, Decreased dorsiflexion - right, Decreased step length - right General Gait Details: HHA on L with min A to steady increasing to mod A with fatigue and increased distance, decreasing RLE clearance, step length, and weight shift  to R with increased distance and fatigue Gait velocity: decreased Gait velocity interpretation: <1.31 ft/sec, indicative of household ambulator   ADL: ADL Overall ADL's : Needs assistance/impaired Eating/Feeding: Independent Eating/Feeding Details (indicate cue type and reason): eating in long sitting upon  arrival, no difficulties with FM or dexterity Grooming: Supervision/safety, Standing Upper Body Bathing: Set up, Sitting Lower Body Bathing: Contact guard assist, Sit to/from stand Upper Body Dressing : Set up, Sitting Lower Body Dressing: Contact guard assist, Sit to/from stand Toilet Transfer: +2 for physical assistance, Moderate assistance Toileting- Clothing Manipulation and Hygiene: Supervision/safety, Sitting/lateral lean Toileting - Clothing Manipulation Details (indicate cue type and reason): unable to void Functional mobility during ADLs: Contact guard assist, Rolling walker (2 wheels) General ADL Comments: limited session secondary to pain   Cognition: Cognition Overall Cognitive Status: Difficult to assess Arousal/Alertness: Awake/alert Orientation Level: Other (comment) (UTA) Attention: Sustained Sustained Attention: Impaired Sustained Attention Impairment: Verbal basic, Functional basic (R inattention/neglect) Awareness: Appears intact Behaviors: Restless, Verbal agitation, Perseveration Cognition Arousal: Alert Behavior During Therapy: WFL for tasks assessed/performed Overall Cognitive Status: Difficult to assess Area of Impairment: Memory Memory:  (decreased memory of PLOF) General Comments: Inconsistent with yes/no responses, bright affect and smiling with achievement of walking today. Visibly frustrated when not able to get words out or communicate needs. responding well to gestures for mobility Difficult to assess due to: Impaired communication   Physical Exam: Blood pressure (!) 149/83, pulse 93, temperature (!) 100.7 F (38.2 C), temperature source Oral, resp. rate 18, height 5\' 2"  (1.575 m), weight 73 kg, SpO2 97%.  Constitutional: Mild distress. Appropriate appearance for age. Laying in bed.   HENT:   Atraumatic, normocephalic. Eyes: PERRLA. EOMI - poor tolerance of exam d/t dizziness. No notable nystagmus.  Cardiovascular: RRR, no murmurs/rub/gallops. No  Edema. Peripheral pulses 2+  Respiratory: CTAB. No rales, rhonchi, or wheezing. On RA.  Abdomen: + bowel sounds, normoactive. No distention; Mild RLQ tenderness.  Skin: C/D/I. No apparent lesions. + IV running heparin - intact.   MSK:      No apparent deformity. No joint pain on ROM.        Neurologic exam:  Cognition: Awake, alert. Responds to 1/3 verbal commands,  2/3 commands with physical cues.  + Severe R hemineglect Language: Global aphasia, expressive > receptive. Anomic. Dysarthric.  Insight: Poor insight into current condition.  Mood: Anxious,  but appropriate and interactive.  Sensation: To light touch reduced on RLE Reflexes: Hyporeflexic RLE, 2+ RUE; 1+ LUE and LLE. Negative Hoffman's and babinski signs bilaterally.  CN: + Mild facial droop  Coordination: No apparent tremors.   Spasticity: MAS 1-2 R elbow flexors, 1 elbow extensors, 1 wrist flexors      Strength: 5/5 LUE and LLE;       R side difficult to assess due to neglect.  RUE 1/5 grip, elbow flexion c/b motor apraxia RLE   3/5 x1 with R hip flexor and knee flexor; no ankle movement.        Lab Results Last 48 Hours        Results for orders placed or performed during the hospital encounter of 04/12/23 (from the past 48 hours)  Glucose, capillary     Status: Abnormal    Collection Time: 04/18/23  4:20 PM  Result Value Ref Range    Glucose-Capillary 103 (H) 70 - 99 mg/dL      Comment: Glucose reference range applies only to samples taken after fasting for at least 8 hours.  Glucose,  capillary     Status: Abnormal    Collection Time: 04/18/23  7:38 PM  Result Value Ref Range    Glucose-Capillary 115 (H) 70 - 99 mg/dL      Comment: Glucose reference range applies only to samples taken after fasting for at least 8 hours.  Glucose, capillary     Status: Abnormal    Collection Time: 04/18/23 11:36 PM  Result Value Ref Range    Glucose-Capillary 120 (H) 70 - 99 mg/dL      Comment: Glucose reference range applies  only to samples taken after fasting for at least 8 hours.  Glucose, capillary     Status: None    Collection Time: 04/19/23  3:44 AM  Result Value Ref Range    Glucose-Capillary 97 70 - 99 mg/dL      Comment: Glucose reference range applies only to samples taken after fasting for at least 8 hours.  Glucose, capillary     Status: Abnormal    Collection Time: 04/19/23  7:23 AM  Result Value Ref Range    Glucose-Capillary 110 (H) 70 - 99 mg/dL      Comment: Glucose reference range applies only to samples taken after fasting for at least 8 hours.    Comment 1 Notify RN    Glucose, capillary     Status: None    Collection Time: 04/19/23 12:11 PM  Result Value Ref Range    Glucose-Capillary 99 70 - 99 mg/dL      Comment: Glucose reference range applies only to samples taken after fasting for at least 8 hours.    Comment 1 Notify RN    Glucose, capillary     Status: Abnormal    Collection Time: 04/19/23  3:13 PM  Result Value Ref Range    Glucose-Capillary 125 (H) 70 - 99 mg/dL      Comment: Glucose reference range applies only to samples taken after fasting for at least 8 hours.    Comment 1 Notify RN    Glucose, capillary     Status: Abnormal    Collection Time: 04/19/23  7:55 PM  Result Value Ref Range    Glucose-Capillary 137 (H) 70 - 99 mg/dL      Comment: Glucose reference range applies only to samples taken after fasting for at least 8 hours.  Glucose, capillary     Status: Abnormal    Collection Time: 04/20/23 12:22 AM  Result Value Ref Range    Glucose-Capillary 110 (H) 70 - 99 mg/dL      Comment: Glucose reference range applies only to samples taken after fasting for at least 8 hours.  Glucose, capillary     Status: Abnormal    Collection Time: 04/20/23  4:27 AM  Result Value Ref Range    Glucose-Capillary 106 (H) 70 - 99 mg/dL      Comment: Glucose reference range applies only to samples taken after fasting for at least 8 hours.  Basic metabolic panel     Status: Abnormal     Collection Time: 04/20/23  7:04 AM  Result Value Ref Range    Sodium 139 135 - 145 mmol/L    Potassium 3.3 (L) 3.5 - 5.1 mmol/L    Chloride 105 98 - 111 mmol/L    CO2 22 22 - 32 mmol/L    Glucose, Bld 101 (H) 70 - 99 mg/dL      Comment: Glucose reference range applies only to samples taken after fasting for at least 8  hours.    BUN 11 6 - 20 mg/dL    Creatinine, Ser 8.65 (H) 0.44 - 1.00 mg/dL    Calcium 9.0 8.9 - 78.4 mg/dL    GFR, Estimated >69 >62 mL/min      Comment: (NOTE) Calculated using the CKD-EPI Creatinine Equation (2021)      Anion gap 12 5 - 15      Comment: Performed at [facility], [address], [place], [place] [address]  CBC     Status: None    Collection Time: 04/20/23  7:04 AM  Result Value Ref Range    WBC 5.0 4.0 - 10.5 K/uL    RBC 4.36 3.87 - 5.11 MIL/uL    Hemoglobin 12.6 12.0 - 15.0 g/dL    HCT 13.2 44.0 - 10.2 %    MCV 85.3 80.0 - 100.0 fL    MCH 28.9 26.0 - 34.0 pg    MCHC 33.9 30.0 - 36.0 g/dL    RDW 72.5 36.6 - 44.0 %    Platelets 180 150 - 400 K/uL    nRBC 0.0 0.0 - 0.2 %      Comment: Performed at [facility], [address], [place], [place] [address]  Glucose, capillary     Status: Abnormal    Collection Time: 04/20/23  7:39 AM  Result Value Ref Range    Glucose-Capillary 100 (H) 70 - 99 mg/dL      Comment: Glucose reference range applies only to samples taken after fasting for at least 8 hours.  Platelet inhibition p2y12 (not at [facility])     Status: Abnormal    Collection Time: 04/20/23 11:08 AM  Result Value Ref Range    Platelet Function  P2Y12 17 (L) 182 - 335 PRU      Comment: (NOTE) The literature has shown a direct correlation of PRU values over 230 with higher risks of thrombotic events. Lower PRU values are associated with platelet inhibition. Performed at [facility], [address], [place], [place] [address]        Imaging Results (Last 48 hours)  No results found.         Blood pressure (!)  149/83, pulse 93, temperature (!) 100.7 F (38.2 C), temperature source Oral, resp. rate 18, height 5\' 2"  (1.575 m), weight 73 kg, SpO2 97%.   Medical Problem List and Plan: 1. Functional deficits secondary to left MCA infarct due to severe left MCA stenosis; notable Hx prior L cerebellar CVAs             -patient may not shower while on continuous heparin drip             -ELOS/Goals: 10 days, SPV PT/OT, Min A SLP   - Stable for [facility] admission  2.  R-CFV DVT (found 1/31)/Antithrombotics: -DVT/anticoagulation:  Pharmaceutical: Heparin low dose X 48 hours and if stable transition to DOAC              -antiplatelet therapy: Brillinta. ASA d/c due to addition of Heparin.    - Per lit review, early mobilization current recommendation, without gross restrictions; no convincing evidence of increased PE risk.    3. Pain Management:  Tylenol prn.  4. Mood/Behavior/Sleep: LCSW to follow for evaluation and support.              -antipsychotic agents: N/A 5. Neuropsych/cognition: This patient is not capable of making decisions on her own behalf. 6. Skin/Wound Care: Routine pressure  relief measures.  7. Fluids/Electrolytes/Nutrition: Monitor I/O. Check CMET in am.  8. HTN: Monitor BP TID--continue amlodipine and hydrochlorothiazide             --avoid hypoperfusion.  9. RLQ pain:  Felt to be due to IBS. Has been on miralax and Senna BID without results --Linzess was started today. Will get KUB  10. Hypokalemia: Supplement added X 2 days.  11. Hyperlipidemia: LDL-124 on Lipitor 80 mg and Zetia.  12. Pre-diabetes: Hgb A1c- 5.9 and every 4 hours CBS have shown BS controlled. --Will monitor fasting BS on serial labs.  13. Substance abuse: Educated on stroke risk w/marijuana.  14. H/o MM: Acute renal failure improving SCr 1.11 @ admission-->1.59-->1.07 15. GERD: On protonix   Jacquelynn Cree, PA-C 04/20/2023  I have examined the patient independently and edited the note for HPI, ROS, exam, assessment,  and plan as appropriate. I am in agreement with the above recommendations.   Angelina Sheriff, DO 04/20/2023

## 2023-04-20 NOTE — Progress Notes (Signed)
Pt admitted to IP rehab, Admission/assessment complete.

## 2023-04-20 NOTE — Discharge Summary (Addendum)
Stroke Discharge Summary  Patient ID: Brittany [name]   MRN: [id number]      DOB: 04/23/1965  Date of Admission: 04/12/2023 Date of Discharge: 04/20/2023  Attending Physician:  Stroke MD Consultant(s):   Dr. 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 with underlying stenosis s/p IR with rescue stent, TICI3 revascularization   Active Problems:   Essential hypertension   HLD   Neuropathy   AKI   Medications to be continued on Rehab Allergies as of 04/20/2023       Reactions   Amitriptyline Other (See Comments)   Patient reported that it made her throat feel like its locking up and it also caused issues with her going to the bathroom   Duloxetine Other (See Comments)   Patient reported that it made her throat lock up and it caused her to have issue with going to the bathroom   Naproxen    Vomiting, sweating, abd spasms   Other    States can't take pain meds that end in "cet" or meds that end in "ine" Darvocet/severe vomiting   Beef-derived Drug Products    Patient has IBS prefers no beef   Pork-derived Products    Patient has IBS prefers no pork   Darvon [propoxyphene] Nausea And Vomiting   Hydrocodone Nausea And Vomiting   Lactose Intolerance (gi)    Bloating, gas, abd pain   Latex Itching, Rash   Oxycodone Nausea And Vomiting   Percocet [oxycodone-acetaminophen] Nausea And Vomiting   Topamax [topiramate]    Memory made her emotional        Medication List     STOP taking these medications    albuterol 108 (90 Base) MCG/ACT inhaler Commonly known as: VENTOLIN HFA   chlorthalidone 25 MG tablet Commonly known as: HYGROTON   hydrochlorothiazide 12.5 MG tablet Commonly known as: HYDRODIURIL   losartan 50 MG tablet Commonly known as: COZAAR   omeprazole 20 MG capsule Commonly known as: PRILOSEC   triamcinolone cream 0.1  % Commonly known as: KENALOG       TAKE these medications    amLODipine 10 MG tablet Commonly known as: NORVASC Take 1 tablet (10 mg total) by mouth daily.   atorvastatin 80 MG tablet Commonly known as: Lipitor Take 1 tablet (80 mg total) by mouth daily.   docusate sodium 100 MG capsule Commonly known as: COLACE Take 1 capsule (100 mg total) by mouth daily. Start taking on: April 21, 2023   ezetimibe 10 MG tablet Commonly known as: ZETIA Take 1 tablet (10 mg total) by mouth daily.   gabapentin 300 MG capsule Commonly known as: NEURONTIN Take 1 capsule (300 mg total) by mouth 3 (three) times daily.   heparin 78295 UT/250ML infusion Inject 850 Units/hr into the vein continuous.   hydrALAZINE 50 MG tablet Commonly known as: APRESOLINE Take 1 tablet (50 mg total) by mouth every 8 (eight) hours.   linaclotide 72 MCG capsule Commonly known as: Linzess Take 1 capsule (72 mcg total) by mouth daily before breakfast.   ticagrelor 90 MG Tabs tablet Commonly known as: BRILINTA Take 1 tablet (90 mg total) by mouth 2 (two) times daily.   vitamin D3 25 MCG tablet Commonly known as: CHOLECALCIFEROL Take 1 tablet (1,000 Units total) by mouth daily. Start taking on: April 21, 2023 What changed:  medication strength how much to take  LABORATORY STUDIES CBC    Component Value Date/Time   WBC 5.0 04/20/2023 0704   RBC 4.36 04/20/2023 0704   HGB 12.6 04/20/2023 0704   HGB 15.1 12/01/2021 1410   HCT 37.2 04/20/2023 0704   HCT 46.1 12/01/2021 1410   PLT 180 04/20/2023 0704   PLT 164 12/01/2021 1410   MCV 85.3 04/20/2023 0704   MCV 87 12/01/2021 1410   MCH 28.9 04/20/2023 0704   MCHC 33.9 04/20/2023 0704   RDW 14.6 04/20/2023 0704   RDW 14.4 12/01/2021 1410   LYMPHSABS 1.5 04/17/2023 0456   LYMPHSABS 2.4 02/04/2019 1450   MONOABS 0.7 04/17/2023 0456   EOSABS 0.0 04/17/2023 0456   EOSABS 0.1 02/04/2019 1450   BASOSABS 0.0 04/17/2023 0456   BASOSABS 0.0  02/04/2019 1450   CMP    Component Value Date/Time   NA 139 04/20/2023 0704   NA 147 (H) 12/01/2021 1410   K 3.3 (L) 04/20/2023 0704   CL 105 04/20/2023 0704   CO2 22 04/20/2023 0704   GLUCOSE 101 (H) 04/20/2023 0704   BUN 11 04/20/2023 0704   BUN 8 12/01/2021 1410   CREATININE 1.07 (H) 04/20/2023 0704   CALCIUM 9.0 04/20/2023 0704   PROT 8.2 (H) 04/12/2023 1255   PROT 6.8 12/01/2021 1410   ALBUMIN 4.3 04/12/2023 1255   ALBUMIN 4.1 12/01/2021 1410   AST 25 04/12/2023 1255   ALT 15 04/12/2023 1255   ALKPHOS 123 04/12/2023 1255   BILITOT 0.5 04/12/2023 1255   BILITOT 0.5 12/01/2021 1410   GFRNONAA >60 04/20/2023 0704   GFRAA 86 02/04/2019 1450   COAGS Lab Results  Component Value Date   INR 1.0 04/12/2023   INR 0.96 12/05/2016   Lipid Panel    Component Value Date/Time   CHOL 184 04/13/2023 0642   CHOL 213 (H) 12/01/2021 1410   TRIG 287 (H) 04/17/2023 0456   HDL 22 (L) 04/13/2023 0642   HDL 30 (L) 12/01/2021 1410   CHOLHDL 8.4 04/13/2023 0642   VLDL 38 04/13/2023 0642   LDLCALC 124 (H) 04/13/2023 0642   LDLCALC 156 (H) 12/01/2021 1410   HgbA1C  Lab Results  Component Value Date   HGBA1C 5.9 (H) 04/13/2023   Urinalysis    Component Value Date/Time   COLORURINE YELLOW 04/12/2023 1400   APPEARANCEUR CLEAR 04/12/2023 1400   LABSPEC 1.010 04/12/2023 1400   PHURINE 5.5 04/12/2023 1400   GLUCOSEU NEGATIVE 04/12/2023 1400   HGBUR NEGATIVE 04/12/2023 1400   BILIRUBINUR NEGATIVE 04/12/2023 1400   BILIRUBINUR neg 10/06/2020 1405   KETONESUR NEGATIVE 04/12/2023 1400   PROTEINUR NEGATIVE 04/12/2023 1400   UROBILINOGEN 0.2 10/06/2020 1405   UROBILINOGEN 0.2 05/21/2017 1111   NITRITE NEGATIVE 04/12/2023 1400   LEUKOCYTESUR NEGATIVE 04/12/2023 1400   Urine Drug Screen     Component Value Date/Time   LABOPIA NONE DETECTED 04/12/2023 1255   COCAINSCRNUR NONE DETECTED 04/12/2023 1255   LABBENZ NONE DETECTED 04/12/2023 1255   AMPHETMU NONE DETECTED 04/12/2023 1255    THCU POSITIVE (A) 04/12/2023 1255   LABBARB NONE DETECTED 04/12/2023 1255    Alcohol Level    Component Value Date/Time   ETH <5 12/05/2016 1014     SIGNIFICANT DIAGNOSTIC STUDIES VAS [place] LOWER EXTREMITY VENOUS (DVT) Result Date: 04/20/2023  Lower Venous DVT Study Patient Name:  Brittany [name]  Date of Exam:   04/20/2023 Medical Rec #: [id number]              Accession #:  [id number] Date of Birth: [birth year]              Patient Gender: F Patient Age:   27 years Exam Location:  [facility] Procedure:      VAS [place] LOWER EXTREMITY VENOUS (DVT) Referring Phys: Scheryl Marten Che Rachal --------------------------------------------------------------------------------  Indications: Stroke, Embolic stroke., and Post-op.  Comparison Study: Previous study on 9.21.2018. Performing Technologist: Fernande Bras  Examination Guidelines: A complete evaluation includes B-mode imaging, spectral Doppler, color Doppler, and power Doppler as needed of all accessible portions of each vessel. Bilateral testing is considered an integral part of a complete examination. Limited examinations for reoccurring indications may be performed as noted. The reflux portion of the exam is performed with the patient in reverse Trendelenburg.  +---------+---------------+---------+-----------+----------+--------------+ RIGHT    CompressibilityPhasicitySpontaneityPropertiesThrombus Aging +---------+---------------+---------+-----------+----------+--------------+ CFV      Partial        Yes      Yes                  Acute          +---------+---------------+---------+-----------+----------+--------------+ SFJ      Partial        Yes      Yes                                 +---------+---------------+---------+-----------+----------+--------------+ FV Prox  Full                                                        +---------+---------------+---------+-----------+----------+--------------+ FV Mid   Full                                                         +---------+---------------+---------+-----------+----------+--------------+ FV DistalFull                                                        +---------+---------------+---------+-----------+----------+--------------+ PFV      Full                                                        +---------+---------------+---------+-----------+----------+--------------+ POP      Full           Yes      Yes                                 +---------+---------------+---------+-----------+----------+--------------+ PTV      Full                                                        +---------+---------------+---------+-----------+----------+--------------+  PERO     Full                                                        +---------+---------------+---------+-----------+----------+--------------+ S/P revascularization of occluded left M1 with right CFA being point of access  +---------+---------------+---------+-----------+----------+--------------+ LEFT     CompressibilityPhasicitySpontaneityPropertiesThrombus Aging +---------+---------------+---------+-----------+----------+--------------+ CFV      Full           Yes      Yes                                 +---------+---------------+---------+-----------+----------+--------------+ SFJ      Full           Yes      Yes                                 +---------+---------------+---------+-----------+----------+--------------+ FV Prox  Full                                                        +---------+---------------+---------+-----------+----------+--------------+ FV Mid   Full                                                        +---------+---------------+---------+-----------+----------+--------------+ FV DistalFull                                                         +---------+---------------+---------+-----------+----------+--------------+ PFV      Full                                                        +---------+---------------+---------+-----------+----------+--------------+ POP      Full           Yes      Yes                                 +---------+---------------+---------+-----------+----------+--------------+ PTV      Full                                                        +---------+---------------+---------+-----------+----------+--------------+ PERO     Full                                                        +---------+---------------+---------+-----------+----------+--------------+  Summary: RIGHT: - Findings consistent with acute deep vein thrombosis involving the right common femoral vein, and SF junction.  - No cystic structure found in the popliteal fossa.  LEFT: - There is no evidence of deep vein thrombosis in the lower extremity.  - No cystic structure found in the popliteal fossa.  *See table(s) above for measurements and observations.    Preliminary    IR PERCUTANEOUS ART THROMBECTOMY/INFUSION INTRACRANIAL INC DIAG ANGIO Result Date: 04/18/2023 INDICATION: Worsening aphasia and right-sided weakness. Right-sided neglect. Occlusion of the left middle cerebral artery on CT angiogram of the head and neck. EXAM: 1. EMERGENT LARGE VESSEL OCCLUSION THROMBOLYSIS (anterior CIRCULATION) COMPARISON:  CT angiogram of the head and neck of April 16, 2023. MEDICATIONS: Ancef 2 g IV was administered within 1 hour of the procedure. ANESTHESIA/SEDATION: General anesthesia. CONTRAST:  Omnipaque 300 approximately 90 mL. FLUOROSCOPY TIME:  Fluoroscopy Time: 79 minutes 18 seconds (2411 mGy). COMPLICATIONS: None immediate. TECHNIQUE: Following a full explanation of the procedure along with the potential associated complications, an informed witnessed consent was obtained. The risks of intracranial hemorrhage of 10%, worsening  neurological deficit, ventilator dependency, death and inability to revascularize were all reviewed in detail with the patient's nephew. The patient was then put under general anesthesia by the Department of Anesthesiology at [facility]. The right groin was prepped and draped in the usual sterile fashion. Thereafter using modified Seldinger technique, transfemoral access into the right common femoral artery was obtained without difficulty. Over an 0.035 inch guidewire an 8 French 25 cm Pinnacle sheath was inserted. Through this, and also over an 0.035 inch guidewire a combination of an 087 balloon guide catheter with a 125 cm 6 [place] Berenstein support catheter was advanced to the aortic arch region, and selectively positioned in the left common carotid artery and the distal left internal carotid artery. FINDINGS: The left common carotid arteriogram demonstrates the left external carotid artery and its major branches to be widely patent. The left internal carotid artery at the bulb has a smooth shallow plaque along the posterior wall with a less than 20% stenosis by the NASCET criteria. No evidence of intraluminal filling defects, or ulcerations evident. More distally, the vessel is seen to opacify to the cranial skull base. The petrous, the cavernous and the supraclinoid left ICA demonstrate wide patency. Left anterior cerebral artery demonstrates a moderate stenosis at its origin. Its branches opacify into the capillary and venous phases. The left middle cerebral artery is occluded just at its origin. PROCEDURE: Through the balloon guide catheter in the distal left ICA, a combination of an 062 aspiration catheter with an 021 160 cm Trevo ProVue microcatheter was advanced over an 018 inch micro guidewire to the supraclinoid left ICA. The micro guidewire was then gently probed into the occluded left middle cerebral artery for distal access. The 018 inch Aristotle micro guidewire with a moderate J  configuration was then replaced with an Aristotle 014 micro guidewire with a J-tip configuration, this was then advanced into the occluded proximal left middle cerebral artery followed by the microcatheter. With a gentle maneuvering of the micro guidewire using a torque device, access was then obtained into the superior division followed by the microcatheter. The guidewire was removed. Good aspiration obtained from the hub of the microcatheter. A gentle control arteriogram performed through this demonstrated safe positioning of the tip of the microcatheter which was then connected to continuous heparinized saline infusion. A 4 mm x 40 mm Solitaire X retrieval device was  then deployed from the mid M2 segment of the superior division to just proximal to the occluded left M1 segment. A control arteriogram performed through the 062 aspiration catheter in the distal left ICA demonstrated patency of the left middle cerebral artery territory retrieving a TICI 2C revascularization. Also uncovered was segmental area of severe atherosclerotic stenosis involving the left M1 segment. Decompressed superior and inferior division branches were also evident. The retriever was then captured by advancing the microcatheter into the distal M2 segment of the superior division. A control arteriogram performed through this demonstrated safe positioning of the tip of the microcatheter. The microcatheter was then exchanged for an 014 inch 300 cm Zoom exchange micro guidewire with a moderate J configuration which was maintained in the distal M2 M3 region while the microcatheter was removed. Over this, a 2 mm x 9 mm Gateway angioplasty microcatheter which had been prepped with 50% contrast and 50% heparinized saline infusion was advanced without difficulty through an intermediate catheter and positioned covering the entirety of the M1 segment of the left middle cerebral artery severely stenotic due to intracranial arteriosclerosis. A control  angioplasty was then performed to 6 atmospheres achieving a diameter of approximately 1.8 mm where it was maintained for approximately 2 minutes. Thereafter, the balloon was deflated and retrieved and removed proximally. A control arteriogram performed through the intermediate catheter in the distal left ICA demonstrated free flow through the left M1 segment into the left MCA branches maintaining a TICI 2C revascularization. However, a control arteriogram performed a few minutes later demonstrated near complete occlusion due to underlying severe intracranial arteriosclerosis. Due to the reocclusion of the left M1 segment, a 2 mm x 12 mm Onyx Frontier balloon mounted stent was chosen for stent placement at the site of the M1 atherosclerotic disease. The balloon mounted stent was prepped with 50% contrast and 50% heparinized saline infusion antegradely and with heparinized saline infusion retrogradely. Using the rapid exchange technique, the stent delivery apparatus was advanced distally into the supraclinoid left ICA without any difficulty with the distal wire being maintained stable. The proximal and the distal markers of the stent were then aligned at the origin of the left M1 segment, and at the bifurcation of the M1 segment. Control inflation was then performed using a micro inflation syringe device via micro tubing to about 1.85 mm where it was maintained for approximately a minute and a half. Thereafter, the balloon was deflated and retrieved and removed. Control arteriograms were then performed at 10, 20 and 35 minutes post stent deployment which continued to demonstrate excellent flow through the left MCA stented segment and with maintenance of 2C revascularization of the distal MCA branches. Prior to the final arteriogram, the micro guidewire was then gently retrieved and removed without any entanglement seen with the left MCA stent. Spasm noted in the left ICA was treated with 2 aliquots of 25 mcg of  nitroglycerin. Final control arteriogram performed through the balloon guide catheter in the proximal left ICA demonstrated wide patency of the left internal carotid artery extra cranially and intracranially. Again moderate spasm in the mid cervical left ICA responded to 25 mcg nitroglycerin. 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 Distal pulses remained Dopplerable feebly bilaterally, with no change in the absent dorsalis pedis pulses bilaterally as prior to the procedure. Flat panel CT of the brain demonstrated no evidence of intracranial hemorrhage. Other areas of  contrast stain were evident in the basal ganglia and the left parietal cortical subcortical areas. The patient was left intubated due to her medical condition and transferred to the neuro ICU for post revascularization care. Medications utilized: Aspirin 81 mg and Brilinta 90 mg via orogastric tube prior to the intra intracranial angioplasty. Half bolus dose of cangrelor followed by a 4 hour half dose infusion following placement of the balloon mounted stent. IMPRESSION: Status post endovascular complete revascularization of occluded left middle cerebral M1 segment, with 1 pass with a 4 mm x 40 mm Solitaire X retrieval device, and balloon angioplasty of the occluded left middle cerebral M1 segment secondary to intracranial arteriosclerosis. Stenting of the left middle cerebral artery M1 segment using a 2 mm x 12 mm anterior balloon mounted stent with restoration of TICI 2C revascularization. PLAN: Follow-up in the clinic 4 weeks post discharge. Electronically Signed   By: Julieanne Cotton M.D.   On: 04/18/2023 08:14   IR Intra Cran Stent Result Date: 04/18/2023 INDICATION: Worsening aphasia and right-sided weakness. Right-sided neglect. Occlusion of the left middle cerebral artery on CT angiogram of the head and neck.  EXAM: 1. EMERGENT LARGE VESSEL OCCLUSION THROMBOLYSIS (anterior CIRCULATION) COMPARISON:  CT angiogram of the head and neck of April 16, 2023. MEDICATIONS: Ancef 2 g IV was administered within 1 hour of the procedure. ANESTHESIA/SEDATION: General anesthesia. CONTRAST:  Omnipaque 300 approximately 90 mL. FLUOROSCOPY TIME:  Fluoroscopy Time: 79 minutes 18 seconds (2411 mGy). COMPLICATIONS: None immediate. TECHNIQUE: Following a full explanation of the procedure along with the potential associated complications, an informed witnessed consent was obtained. The risks of intracranial hemorrhage of 10%, worsening neurological deficit, ventilator dependency, death and inability to revascularize were all reviewed in detail with the patient's nephew. The patient was then put under general anesthesia by the Department of Anesthesiology at [facility]. The right groin was prepped and draped in the usual sterile fashion. Thereafter using modified Seldinger technique, transfemoral access into the right common femoral artery was obtained without difficulty. Over an 0.035 inch guidewire an 8 French 25 cm Pinnacle sheath was inserted. Through this, and also over an 0.035 inch guidewire a combination of an 087 balloon guide catheter with a 125 cm 6 [place] Berenstein support catheter was advanced to the aortic arch region, and selectively positioned in the left common carotid artery and the distal left internal carotid artery. FINDINGS: The left common carotid arteriogram demonstrates the left external carotid artery and its major branches to be widely patent. The left internal carotid artery at the bulb has a smooth shallow plaque along the posterior wall with a less than 20% stenosis by the NASCET criteria. No evidence of intraluminal filling defects, or ulcerations evident. More distally, the vessel is seen to opacify to the cranial skull base. The petrous, the cavernous and the supraclinoid left ICA demonstrate wide  patency. Left anterior cerebral artery demonstrates a moderate stenosis at its origin. Its branches opacify into the capillary and venous phases. The left middle cerebral artery is occluded just at its origin. PROCEDURE: Through the balloon guide catheter in the distal left ICA, a combination of an 062 aspiration catheter with an 021 160 cm Trevo ProVue microcatheter was advanced over an 018 inch micro guidewire to the supraclinoid left ICA. The micro guidewire was then gently probed into the occluded left middle cerebral artery for distal access. The 018 inch Aristotle micro guidewire with a moderate J configuration was then replaced with an Aristotle 014 micro guidewire  with a J-tip configuration, this was then advanced into the occluded proximal left middle cerebral artery followed by the microcatheter. With a gentle maneuvering of the micro guidewire using a torque device, access was then obtained into the superior division followed by the microcatheter. The guidewire was removed. Good aspiration obtained from the hub of the microcatheter. A gentle control arteriogram performed through this demonstrated safe positioning of the tip of the microcatheter which was then connected to continuous heparinized saline infusion. A 4 mm x 40 mm Solitaire X retrieval device was then deployed from the mid M2 segment of the superior division to just proximal to the occluded left M1 segment. A control arteriogram performed through the 062 aspiration catheter in the distal left ICA demonstrated patency of the left middle cerebral artery territory retrieving a TICI 2C revascularization. Also uncovered was segmental area of severe atherosclerotic stenosis involving the left M1 segment. Decompressed superior and inferior division branches were also evident. The retriever was then captured by advancing the microcatheter into the distal M2 segment of the superior division. A control arteriogram performed through this demonstrated  safe positioning of the tip of the microcatheter. The microcatheter was then exchanged for an 014 inch 300 cm Zoom exchange micro guidewire with a moderate J configuration which was maintained in the distal M2 M3 region while the microcatheter was removed. Over this, a 2 mm x 9 mm Gateway angioplasty microcatheter which had been prepped with 50% contrast and 50% heparinized saline infusion was advanced without difficulty through an intermediate catheter and positioned covering the entirety of the M1 segment of the left middle cerebral artery severely stenotic due to intracranial arteriosclerosis. A control angioplasty was then performed to 6 atmospheres achieving a diameter of approximately 1.8 mm where it was maintained for approximately 2 minutes. Thereafter, the balloon was deflated and retrieved and removed proximally. A control arteriogram performed through the intermediate catheter in the distal left ICA demonstrated free flow through the left M1 segment into the left MCA branches maintaining a TICI 2C revascularization. However, a control arteriogram performed a few minutes later demonstrated near complete occlusion due to underlying severe intracranial arteriosclerosis. Due to the reocclusion of the left M1 segment, a 2 mm x 12 mm Onyx Frontier balloon mounted stent was chosen for stent placement at the site of the M1 atherosclerotic disease. The balloon mounted stent was prepped with 50% contrast and 50% heparinized saline infusion antegradely and with heparinized saline infusion retrogradely. Using the rapid exchange technique, the stent delivery apparatus was advanced distally into the supraclinoid left ICA without any difficulty with the distal wire being maintained stable. The proximal and the distal markers of the stent were then aligned at the origin of the left M1 segment, and at the bifurcation of the M1 segment. Control inflation was then performed using a micro inflation syringe device via micro  tubing to about 1.85 mm where it was maintained for approximately a minute and a half. Thereafter, the balloon was deflated and retrieved and removed. Control arteriograms were then performed at 10, 20 and 35 minutes post stent deployment which continued to demonstrate excellent flow through the left MCA stented segment and with maintenance of 2C revascularization of the distal MCA branches. Prior to the final arteriogram, the micro guidewire was then gently retrieved and removed without any entanglement seen with the left MCA stent. Spasm noted in the left ICA was treated with 2 aliquots of 25 mcg of nitroglycerin. Final control arteriogram performed through the balloon guide  catheter in the proximal left ICA demonstrated wide patency of the left internal carotid artery extra cranially and intracranially. Again moderate spasm in the mid cervical left ICA responded to 25 mcg nitroglycerin. 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 Distal pulses remained Dopplerable feebly bilaterally, with no change in the absent dorsalis pedis pulses bilaterally as prior to the procedure. Flat panel CT of the brain demonstrated no evidence of intracranial hemorrhage. Other areas of contrast stain were evident in the basal ganglia and the left parietal cortical subcortical areas. The patient was left intubated due to her medical condition and transferred to the neuro ICU for post revascularization care. Medications utilized: Aspirin 81 mg and Brilinta 90 mg via orogastric tube prior to the intra intracranial angioplasty. Half bolus dose of cangrelor followed by a 4 hour half dose infusion following placement of the balloon mounted stent. IMPRESSION: Status post endovascular complete revascularization of occluded left middle cerebral M1 segment, with 1 pass with a 4 mm x 40 mm Solitaire X retrieval device, and  balloon angioplasty of the occluded left middle cerebral M1 segment secondary to intracranial arteriosclerosis. Stenting of the left middle cerebral artery M1 segment using a 2 mm x 12 mm anterior balloon mounted stent with restoration of TICI 2C revascularization. PLAN: Follow-up in the clinic 4 weeks post discharge. Electronically Signed   By: Julieanne Cotton M.D.   On: 04/18/2023 08:14   IR CT Head Ltd Result Date: 04/18/2023 INDICATION: Worsening aphasia and right-sided weakness. Right-sided neglect. Occlusion of the left middle cerebral artery on CT angiogram of the head and neck. EXAM: 1. EMERGENT LARGE VESSEL OCCLUSION THROMBOLYSIS (anterior CIRCULATION) COMPARISON:  CT angiogram of the head and neck of April 16, 2023. MEDICATIONS: Ancef 2 g IV was administered within 1 hour of the procedure. ANESTHESIA/SEDATION: General anesthesia. CONTRAST:  Omnipaque 300 approximately 90 mL. FLUOROSCOPY TIME:  Fluoroscopy Time: 79 minutes 18 seconds (2411 mGy). COMPLICATIONS: None immediate. TECHNIQUE: Following a full explanation of the procedure along with the potential associated complications, an informed witnessed consent was obtained. The risks of intracranial hemorrhage of 10%, worsening neurological deficit, ventilator dependency, death and inability to revascularize were all reviewed in detail with the patient's nephew. The patient was then put under general anesthesia by the Department of Anesthesiology at [facility]. The right groin was prepped and draped in the usual sterile fashion. Thereafter using modified Seldinger technique, transfemoral access into the right common femoral artery was obtained without difficulty. Over an 0.035 inch guidewire an 8 French 25 cm Pinnacle sheath was inserted. Through this, and also over an 0.035 inch guidewire a combination of an 087 balloon guide catheter with a 125 cm 6 [place] Berenstein support catheter was advanced to the aortic arch region, and  selectively positioned in the left common carotid artery and the distal left internal carotid artery. FINDINGS: The left common carotid arteriogram demonstrates the left external carotid artery and its major branches to be widely patent. The left internal carotid artery at the bulb has a smooth shallow plaque along the posterior wall with a less than 20% stenosis by the NASCET criteria. No evidence of intraluminal filling defects, or ulcerations evident. More distally, the vessel is seen to opacify to the cranial skull base. The petrous, the cavernous and the supraclinoid left ICA demonstrate wide patency. Left anterior cerebral artery demonstrates a moderate stenosis at its origin. Its branches opacify  into the capillary and venous phases. The left middle cerebral artery is occluded just at its origin. PROCEDURE: Through the balloon guide catheter in the distal left ICA, a combination of an 062 aspiration catheter with an 021 160 cm Trevo ProVue microcatheter was advanced over an 018 inch micro guidewire to the supraclinoid left ICA. The micro guidewire was then gently probed into the occluded left middle cerebral artery for distal access. The 018 inch Aristotle micro guidewire with a moderate J configuration was then replaced with an Aristotle 014 micro guidewire with a J-tip configuration, this was then advanced into the occluded proximal left middle cerebral artery followed by the microcatheter. With a gentle maneuvering of the micro guidewire using a torque device, access was then obtained into the superior division followed by the microcatheter. The guidewire was removed. Good aspiration obtained from the hub of the microcatheter. A gentle control arteriogram performed through this demonstrated safe positioning of the tip of the microcatheter which was then connected to continuous heparinized saline infusion. A 4 mm x 40 mm Solitaire X retrieval device was then deployed from the mid M2 segment of the superior  division to just proximal to the occluded left M1 segment. A control arteriogram performed through the 062 aspiration catheter in the distal left ICA demonstrated patency of the left middle cerebral artery territory retrieving a TICI 2C revascularization. Also uncovered was segmental area of severe atherosclerotic stenosis involving the left M1 segment. Decompressed superior and inferior division branches were also evident. The retriever was then captured by advancing the microcatheter into the distal M2 segment of the superior division. A control arteriogram performed through this demonstrated safe positioning of the tip of the microcatheter. The microcatheter was then exchanged for an 014 inch 300 cm Zoom exchange micro guidewire with a moderate J configuration which was maintained in the distal M2 M3 region while the microcatheter was removed. Over this, a 2 mm x 9 mm Gateway angioplasty microcatheter which had been prepped with 50% contrast and 50% heparinized saline infusion was advanced without difficulty through an intermediate catheter and positioned covering the entirety of the M1 segment of the left middle cerebral artery severely stenotic due to intracranial arteriosclerosis. A control angioplasty was then performed to 6 atmospheres achieving a diameter of approximately 1.8 mm where it was maintained for approximately 2 minutes. Thereafter, the balloon was deflated and retrieved and removed proximally. A control arteriogram performed through the intermediate catheter in the distal left ICA demonstrated free flow through the left M1 segment into the left MCA branches maintaining a TICI 2C revascularization. However, a control arteriogram performed a few minutes later demonstrated near complete occlusion due to underlying severe intracranial arteriosclerosis. Due to the reocclusion of the left M1 segment, a 2 mm x 12 mm Onyx Frontier balloon mounted stent was chosen for stent placement at the site of the M1  atherosclerotic disease. The balloon mounted stent was prepped with 50% contrast and 50% heparinized saline infusion antegradely and with heparinized saline infusion retrogradely. Using the rapid exchange technique, the stent delivery apparatus was advanced distally into the supraclinoid left ICA without any difficulty with the distal wire being maintained stable. The proximal and the distal markers of the stent were then aligned at the origin of the left M1 segment, and at the bifurcation of the M1 segment. Control inflation was then performed using a micro inflation syringe device via micro tubing to about 1.85 mm where it was maintained for approximately a minute and a half.  Thereafter, the balloon was deflated and retrieved and removed. Control arteriograms were then performed at 10, 20 and 35 minutes post stent deployment which continued to demonstrate excellent flow through the left MCA stented segment and with maintenance of 2C revascularization of the distal MCA branches. Prior to the final arteriogram, the micro guidewire was then gently retrieved and removed without any entanglement seen with the left MCA stent. Spasm noted in the left ICA was treated with 2 aliquots of 25 mcg of nitroglycerin. Final control arteriogram performed through the balloon guide catheter in the proximal left ICA demonstrated wide patency of the left internal carotid artery extra cranially and intracranially. Again moderate spasm in the mid cervical left ICA responded to 25 mcg nitroglycerin. 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 Distal pulses remained Dopplerable feebly bilaterally, with no change in the absent dorsalis pedis pulses bilaterally as prior to the procedure. Flat panel CT of the brain demonstrated no evidence of intracranial hemorrhage. Other areas of contrast stain were evident in the  basal ganglia and the left parietal cortical subcortical areas. The patient was left intubated due to her medical condition and transferred to the neuro ICU for post revascularization care. Medications utilized: Aspirin 81 mg and Brilinta 90 mg via orogastric tube prior to the intra intracranial angioplasty. Half bolus dose of cangrelor followed by a 4 hour half dose infusion following placement of the balloon mounted stent. IMPRESSION: Status post endovascular complete revascularization of occluded left middle cerebral M1 segment, with 1 pass with a 4 mm x 40 mm Solitaire X retrieval device, and balloon angioplasty of the occluded left middle cerebral M1 segment secondary to intracranial arteriosclerosis. Stenting of the left middle cerebral artery M1 segment using a 2 mm x 12 mm anterior balloon mounted stent with restoration of TICI 2C revascularization. PLAN: Follow-up in the clinic 4 weeks post discharge. Electronically Signed   By: Julieanne Cotton M.D.   On: 04/18/2023 08:14   IR CT Head Ltd Result Date: 04/18/2023 INDICATION: Worsening aphasia and right-sided weakness. Right-sided neglect. Occlusion of the left middle cerebral artery on CT angiogram of the head and neck. EXAM: 1. EMERGENT LARGE VESSEL OCCLUSION THROMBOLYSIS (anterior CIRCULATION) COMPARISON:  CT angiogram of the head and neck of April 16, 2023. MEDICATIONS: Ancef 2 g IV was administered within 1 hour of the procedure. ANESTHESIA/SEDATION: General anesthesia. CONTRAST:  Omnipaque 300 approximately 90 mL. FLUOROSCOPY TIME:  Fluoroscopy Time: 79 minutes 18 seconds (2411 mGy). COMPLICATIONS: None immediate. TECHNIQUE: Following a full explanation of the procedure along with the potential associated complications, an informed witnessed consent was obtained. The risks of intracranial hemorrhage of 10%, worsening neurological deficit, ventilator dependency, death and inability to revascularize were all reviewed in detail with the patient's  nephew. The patient was then put under general anesthesia by the Department of Anesthesiology at [facility]. The right groin was prepped and draped in the usual sterile fashion. Thereafter using modified Seldinger technique, transfemoral access into the right common femoral artery was obtained without difficulty. Over an 0.035 inch guidewire an 8 French 25 cm Pinnacle sheath was inserted. Through this, and also over an 0.035 inch guidewire a combination of an 087 balloon guide catheter with a 125 cm 6 [place] Berenstein support catheter was advanced to the aortic arch region, and selectively positioned in the left common carotid artery and the distal left internal carotid artery. FINDINGS: The  left common carotid arteriogram demonstrates the left external carotid artery and its major branches to be widely patent. The left internal carotid artery at the bulb has a smooth shallow plaque along the posterior wall with a less than 20% stenosis by the NASCET criteria. No evidence of intraluminal filling defects, or ulcerations evident. More distally, the vessel is seen to opacify to the cranial skull base. The petrous, the cavernous and the supraclinoid left ICA demonstrate wide patency. Left anterior cerebral artery demonstrates a moderate stenosis at its origin. Its branches opacify into the capillary and venous phases. The left middle cerebral artery is occluded just at its origin. PROCEDURE: Through the balloon guide catheter in the distal left ICA, a combination of an 062 aspiration catheter with an 021 160 cm Trevo ProVue microcatheter was advanced over an 018 inch micro guidewire to the supraclinoid left ICA. The micro guidewire was then gently probed into the occluded left middle cerebral artery for distal access. The 018 inch Aristotle micro guidewire with a moderate J configuration was then replaced with an Aristotle 014 micro guidewire with a J-tip configuration, this was then advanced into the occluded  proximal left middle cerebral artery followed by the microcatheter. With a gentle maneuvering of the micro guidewire using a torque device, access was then obtained into the superior division followed by the microcatheter. The guidewire was removed. Good aspiration obtained from the hub of the microcatheter. A gentle control arteriogram performed through this demonstrated safe positioning of the tip of the microcatheter which was then connected to continuous heparinized saline infusion. A 4 mm x 40 mm Solitaire X retrieval device was then deployed from the mid M2 segment of the superior division to just proximal to the occluded left M1 segment. A control arteriogram performed through the 062 aspiration catheter in the distal left ICA demonstrated patency of the left middle cerebral artery territory retrieving a TICI 2C revascularization. Also uncovered was segmental area of severe atherosclerotic stenosis involving the left M1 segment. Decompressed superior and inferior division branches were also evident. The retriever was then captured by advancing the microcatheter into the distal M2 segment of the superior division. A control arteriogram performed through this demonstrated safe positioning of the tip of the microcatheter. The microcatheter was then exchanged for an 014 inch 300 cm Zoom exchange micro guidewire with a moderate J configuration which was maintained in the distal M2 M3 region while the microcatheter was removed. Over this, a 2 mm x 9 mm Gateway angioplasty microcatheter which had been prepped with 50% contrast and 50% heparinized saline infusion was advanced without difficulty through an intermediate catheter and positioned covering the entirety of the M1 segment of the left middle cerebral artery severely stenotic due to intracranial arteriosclerosis. A control angioplasty was then performed to 6 atmospheres achieving a diameter of approximately 1.8 mm where it was maintained for approximately 2  minutes. Thereafter, the balloon was deflated and retrieved and removed proximally. A control arteriogram performed through the intermediate catheter in the distal left ICA demonstrated free flow through the left M1 segment into the left MCA branches maintaining a TICI 2C revascularization. However, a control arteriogram performed a few minutes later demonstrated near complete occlusion due to underlying severe intracranial arteriosclerosis. Due to the reocclusion of the left M1 segment, a 2 mm x 12 mm Onyx Frontier balloon mounted stent was chosen for stent placement at the site of the M1 atherosclerotic disease. The balloon mounted stent was prepped with 50% contrast and 50% heparinized  saline infusion antegradely and with heparinized saline infusion retrogradely. Using the rapid exchange technique, the stent delivery apparatus was advanced distally into the supraclinoid left ICA without any difficulty with the distal wire being maintained stable. The proximal and the distal markers of the stent were then aligned at the origin of the left M1 segment, and at the bifurcation of the M1 segment. Control inflation was then performed using a micro inflation syringe device via micro tubing to about 1.85 mm where it was maintained for approximately a minute and a half. Thereafter, the balloon was deflated and retrieved and removed. Control arteriograms were then performed at 10, 20 and 35 minutes post stent deployment which continued to demonstrate excellent flow through the left MCA stented segment and with maintenance of 2C revascularization of the distal MCA branches. Prior to the final arteriogram, the micro guidewire was then gently retrieved and removed without any entanglement seen with the left MCA stent. Spasm noted in the left ICA was treated with 2 aliquots of 25 mcg of nitroglycerin. Final control arteriogram performed through the balloon guide catheter in the proximal left ICA demonstrated wide patency of the  left internal carotid artery extra cranially and intracranially. Again moderate spasm in the mid cervical left ICA responded to 25 mcg nitroglycerin. 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 Distal pulses remained Dopplerable feebly bilaterally, with no change in the absent dorsalis pedis pulses bilaterally as prior to the procedure. Flat panel CT of the brain demonstrated no evidence of intracranial hemorrhage. Other areas of contrast stain were evident in the basal ganglia and the left parietal cortical subcortical areas. The patient was left intubated due to her medical condition and transferred to the neuro ICU for post revascularization care. Medications utilized: Aspirin 81 mg and Brilinta 90 mg via orogastric tube prior to the intra intracranial angioplasty. Half bolus dose of cangrelor followed by a 4 hour half dose infusion following placement of the balloon mounted stent. IMPRESSION: Status post endovascular complete revascularization of occluded left middle cerebral M1 segment, with 1 pass with a 4 mm x 40 mm Solitaire X retrieval device, and balloon angioplasty of the occluded left middle cerebral M1 segment secondary to intracranial arteriosclerosis. Stenting of the left middle cerebral artery M1 segment using a 2 mm x 12 mm anterior balloon mounted stent with restoration of TICI 2C revascularization. PLAN: Follow-up in the clinic 4 weeks post discharge. Electronically Signed   By: Julieanne Cotton M.D.   On: 04/18/2023 08:14   MR BRAIN WO CONTRAST Result Date: 04/18/2023 CLINICAL DATA:  Stroke, follow up; Neuro deficit, acute, stroke suspected EXAM: MRI HEAD WITHOUT CONTRAST MRA HEAD WITHOUT CONTRAST TECHNIQUE: Multiplanar, multi-echo pulse sequences of the brain and surrounding structures were acquired without intravenous contrast. Angiographic images of the [place] were acquired using MRA technique without intravenous contrast. COMPARISON:  CT head 04/16/2023. CTA head/neck 04/16/2023. FINDINGS: MRI HEAD FINDINGS Brain: Acute left MCA territory infarct which involves the left basal ganglia, overlying left frontal lobe, left parietal lobe and anterior left temporal lobe. Intraparenchymal hemorrhage centered in the left basal ganglia appears similar when comparing across modalities to recent CT head. Similar mild rightward midline shift. No visible mass lesion. No hydrocephalus. Remote left cerebellar infarcts. Vascular: See below. Skull and upper cervical spine: Normal marrow signal. Sinuses/Orbits: Mild paranasal sinus mucosal thickening. No acute orbital findings. Other: No mastoid effusions. MRA  HEAD FINDINGS Anterior circulation: Bilateral intracranial ICAs, MCAs and ACAs are patent without proximal hemodynamically significant stenosis. There is a stent in the left M1 MCA which obscures the vessel in this region. No aneurysm identified. Posterior circulation: Bilateral intradural vertebral arteries, basilar artery and bilateral posterior cerebral arteries are patent without proximal hemodynamically significant stenosis. No aneurysm identified. IMPRESSION: 1. Acute left MCA territory infarct with similar intraparenchymal hemorrhage centered in the left basal ganglia when comparing across modalities to recent CT head. Similar mild rightward midline shift. 2. Left M1 MCA is now patent.  No large vessel occlusion. Electronically Signed   By: Feliberto Harts M.D.   On: 04/18/2023 01:28   MR ANGIO HEAD WO CONTRAST Result Date: 04/18/2023 CLINICAL DATA:  Stroke, follow up; Neuro deficit, acute, stroke suspected EXAM: MRI HEAD WITHOUT CONTRAST MRA HEAD WITHOUT CONTRAST TECHNIQUE: Multiplanar, multi-echo pulse sequences of the brain and surrounding structures were acquired without intravenous contrast. Angiographic images of the [place] were acquired using MRA  technique without intravenous contrast. COMPARISON:  CT head 04/16/2023. CTA head/neck 04/16/2023. FINDINGS: MRI HEAD FINDINGS Brain: Acute left MCA territory infarct which involves the left basal ganglia, overlying left frontal lobe, left parietal lobe and anterior left temporal lobe. Intraparenchymal hemorrhage centered in the left basal ganglia appears similar when comparing across modalities to recent CT head. Similar mild rightward midline shift. No visible mass lesion. No hydrocephalus. Remote left cerebellar infarcts. Vascular: See below. Skull and upper cervical spine: Normal marrow signal. Sinuses/Orbits: Mild paranasal sinus mucosal thickening. No acute orbital findings. Other: No mastoid effusions. MRA HEAD FINDINGS Anterior circulation: Bilateral intracranial ICAs, MCAs and ACAs are patent without proximal hemodynamically significant stenosis. There is a stent in the left M1 MCA which obscures the vessel in this region. No aneurysm identified. Posterior circulation: Bilateral intradural vertebral arteries, basilar artery and bilateral posterior cerebral arteries are patent without proximal hemodynamically significant stenosis. No aneurysm identified. IMPRESSION: 1. Acute left MCA territory infarct with similar intraparenchymal hemorrhage centered in the left basal ganglia when comparing across modalities to recent CT head. Similar mild rightward midline shift. 2. Left M1 MCA is now patent.  No large vessel occlusion. Electronically Signed   By: Feliberto Harts M.D.   On: 04/18/2023 01:28   DG Abd Portable 1V Result Date: 04/17/2023 CLINICAL DATA:  Bowel impaction EXAM: PORTABLE ABDOMEN - 1 VIEW COMPARISON:  04/16/2023 FINDINGS: The patient is rotated to the right on today's radiograph, reducing diagnostic sensitivity and specificity. Previous nasogastric tube no longer visible along the abdomen. No visible feeding tube. There is scattered gas in the colon without a substantial burden of colonic  stool. No dilated small bowel is identified. IMPRESSION: 1. No substantial burden of colonic stool is identified. 2. Previous nasogastric tube no longer visible along the abdomen. Electronically Signed   By: Gaylyn Rong M.D.   On: 04/17/2023 18:43   CT HEAD WO CONTRAST ( ) Result Date: 04/16/2023 CLINICAL DATA:  Stroke, follow-up EXAM: CT HEAD WITHOUT CONTRAST TECHNIQUE: Contiguous axial images were obtained from the base of the skull through the vertex without intravenous contrast. RADIATION DOSE REDUCTION: This exam was performed according to the departmental dose-optimization program which includes automated exposure control, adjustment of the mA and/or kV according to patient size and/or use of iterative reconstruction technique. COMPARISON:  04/16/2023 CT head 10:48 a.m., 04/16/2023 IR CT head FINDINGS: Brain: Hyperdense material area around the left M1, which correlates with area of hypodensity on the prior CT head. No evidence of  additional acute infarct, mass, mass effect, or midline shift. No hydrocephalus. Gray-white differentiation is preserved. The basilar cisterns are patent. Vascular: Status post interval placement of a left M1 stent. Some contrast is noted within the vasculature. Skull: Negative for fracture or focal lesion. Sinuses/Orbits: Mucosal thickening in the sphenoid sinuses and ethmoid air cells. No acute finding in the orbits. Other: The mastoid air cells are well aerated. IMPRESSION: 1. Hyperdense material around the left M1, which correlates with area of hypodensity on the prior CT head, favored to represent a combination of petechial hemorrhage and contrast staining within the area of prior infarct. Heidelberg classification 1b: HI2, confluent petechiae, no mass effect. 2. Status post interval placement of a left M1 stent. Electronically Signed   By: Wiliam Ke M.D.   On: 04/16/2023 18:47   DG Abd 1 View Result Date: 04/16/2023 CLINICAL DATA:  Feeding tube placement  EXAM: ABDOMEN - 1 VIEW COMPARISON:  04/14/2023 FINDINGS: No feeding tube is visible projecting over the abdomen. However, there is a nasogastric tube with tip in the stomach body. Contrast medium noted in the renal collecting systems with faint visibility of the renal parenchyma as well. Given the renal parenchymal persistent nephrogram, the possibility of renal dysfunction might be considered. IMPRESSION: 1. Nasogastric tube with tip in the stomach body. 2. Contrast medium in the renal collecting systems with faint visibility of the renal parenchyma as well. Given the renal parenchymal persistent nephrogram, the possibility of renal dysfunction might be considered. Electronically Signed   By: Gaylyn Rong M.D.   On: 04/16/2023 18:17   Portable Chest x-ray Result Date: 04/16/2023 CLINICAL DATA:  Intubation. EXAM: PORTABLE CHEST 1 VIEW COMPARISON:  Radiographs 12/05/2022.  Cardiac CT 12/20/2016. FINDINGS: 1613 hours. Tip of the endotracheal tube overlies the mid trachea. Enteric tube projects just below the diaphragm with the side hole above the expected location of the gastroesophageal junction. The heart size and mediastinal contours are normal. The lungs appear clear. There is no pleural effusion or pneumothorax. The bones appear unremarkable. IMPRESSION: 1. Satisfactory position of endotracheal tube. 2. Enteric tube side hole above the expected location of the gastroesophageal junction. Recommend advancement. 3. No evidence of acute cardiopulmonary process. Electronically Signed   By: Carey Bullocks M.D.   On: 04/16/2023 16:36   CT ANGIO HEAD NECK W WO CM (CODE STROKE) Result Date: 04/16/2023 CLINICAL DATA:  Neuro deficit, acute, stroke suspected. Worsening aphasia, recent near occlusive L M1 on CTA. EXAM: CT ANGIOGRAPHY HEAD AND NECK WITH AND WITHOUT CONTRAST TECHNIQUE: Multidetector CT imaging of the head and neck was performed using the standard protocol during bolus administration of intravenous  contrast. Multiplanar CT image reconstructions and MIPs were obtained to evaluate the vascular anatomy. Carotid stenosis measurements (when applicable) are obtained utilizing NASCET criteria, using the distal internal carotid diameter as the denominator. RADIATION DOSE REDUCTION: This exam was performed according to the departmental dose-optimization program which includes automated exposure control, adjustment of the mA and/or kV according to patient size and/or use of iterative reconstruction technique. CONTRAST:  75mL OMNIPAQUE IOHEXOL 350 MG/ML SOLN COMPARISON:  CTA head and neck 04/12/2023 FINDINGS: CTA NECK FINDINGS Aortic arch: Normal variant aortic arch branching pattern with common origin of the brachiocephalic and left common carotid arteries. Widely patent arch vessel origins. Right carotid system: Patent without evidence a significant stenosis or dissection. Left carotid system: Patent without evidence of a significant stenosis or dissection. Mild atheromatous irregularity of the carotid bulb. Vertebral arteries: Patent without evidence of  a significant stenosis or dissection. Dominant right vertebral artery. Skeleton: Mild-to-moderate cervical spondylosis. Other neck: No evidence of cervical lymphadenopathy or mass. Upper chest: Clear lung apices. Review of the MIP images confirms the above findings CTA HEAD FINDINGS Anterior circulation: The internal carotid arteries are widely patent from skull base to carotid termini. There is occlusion of the proximal left M1 segment with worsening, now only minimal collateralization of left MCA branch vessels. The right MCA and both ACAs are patent. A moderate proximal right M1 stenosis is more prominent than on the prior. No aneurysm is identified. Posterior circulation: The intracranial vertebral arteries are widely patent to the basilar. Patent PICA, AICA, and SCA origins are visualized bilaterally. The basilar artery is widely patent. The basilar artery is  crash that posterior communicating arteries are diminutive or absent. Both PCAs are patent without evidence of a significant proximal stenosis. No aneurysm is identified. Venous sinuses: Poorly evaluated due to contrast timing. Anatomic variants: None. Review of the MIP images confirms the above findings Emergent findings were communicated by telephone to Dr. Selina Cooley on 04/16/2023 at 11:34 a.m. IMPRESSION: 1. Proximal left M1 occlusion with only minimal collateralization of left MCA branch vessels, progressed from 04/12/2023. 2. Moderate proximal right M1 stenosis. 3. Widely patent cervical carotid and vertebral arteries. Electronically Signed   By: Sebastian Ache M.D.   On: 04/16/2023 11:47   CT HEAD CODE STROKE WO CONTRAST` Result Date: 04/16/2023 CLINICAL DATA:  Code stroke.  Delirium. EXAM: CT HEAD WITHOUT CONTRAST TECHNIQUE: Contiguous axial images were obtained from the base of the skull through the vertex without intravenous contrast. RADIATION DOSE REDUCTION: This exam was performed according to the departmental dose-optimization program which includes automated exposure control, adjustment of the mA and/or kV according to patient size and/or use of iterative reconstruction technique. COMPARISON:  CT head without contrast 04/12/2023. MR head without contrast 04/12/2023. FINDINGS: Brain: Expected evolution of recently seen ischemic infarcts in the left MCA territory noted. No definite new infarcts are present. No significant hemorrhage is present. There is some effacement of the sulci without midline shift or significant mass effect on the ventricle. The deep gray nuclei are intact. The brainstem and cerebellum are within normal limits. Midline structures are within normal limits. Vascular: No hyperdense vessel or unexpected calcification. Skull: Calvarium is intact. No focal lytic or blastic lesions are present. No significant extracranial soft tissue lesion is present. Sinuses/Orbits: The paranasal sinuses  and mastoid air cells are clear. ASPECTS [facility] Stroke Program Early CT Score) - Ganglionic level infarction (caudate, lentiform nuclei, internal capsule, insula, M1-M3 cortex): 7/7 - Supraganglionic infarction (M4-M6 cortex): 3/3 Total score (0-10 with 10 being normal): 10/10 IMPRESSION: 1. Expected evolution of recently seen ischemic infarcts in the left MCA territory. 2. No definite new infarcts are present. 3. No significant hemorrhage. 4. Aspects is 10/10. The above was relayed via text pager to Dr. Selina Cooley on 04/16/2023 at 11:01 . Electronically Signed   By: Marin Roberts M.D.   On: 04/16/2023 11:01   DG Abd Portable 1V Result Date: 04/14/2023 CLINICAL DATA:  Abdominal pain. EXAM: PORTABLE ABDOMEN - 1 VIEW COMPARISON:  CT 05/30/2018 FINDINGS: No bowel dilatation to suggest obstruction. Air within prominent but not abnormally dilated transverse and descending colon. Formed stool in the ascending colon. No abnormal rectal distention. Pelvic phleboliths. No abdominal radiopaque calculi. On limited assessment, no acute osseous findings. IMPRESSION: Normal bowel gas pattern.  No explanation for pain. Electronically Signed   By: Ivette Loyal.D.  On: 04/14/2023 15:03   ECHOCARDIOGRAM COMPLETE Result Date: 04/13/2023    ECHOCARDIOGRAM REPORT   Patient Name:   Brittany [name] Date of Exam: 04/13/2023 Medical Rec #:  [id number]             Height:       62.0 in Accession #:    [id number]            Weight:       160.9 lb Date of Birth:  02/03/1966             BSA:          1.743 m Patient Age:    57 years              BP:           171/73 mmHg Patient Gender: F                     HR:           60 bpm. Exam Location:  Inpatient Procedure: 2D Echo, Cardiac Doppler and Color Doppler Indications:    Stroke  History:        Patient has prior history of Echocardiogram examinations, most                 recent 12/07/2022. Stroke, Arrythmias:Tachycardia; Risk                 Factors:Hypertension, Former  Smoker and Dyslipidemia.  Sonographer:    Vern Claude Referring Phys: Burnadette Pop IMPRESSIONS  1. Mild mid cavitary gradient. Peak velocity 0.94 m/s. Peak gradient 3.5 mmHg. With Valsalva, velocity increases to 1.2 m/s and peak gradient 5.6 mmHg. Left ventricular ejection fraction, by estimation, is 60 to 65%. The left ventricle has normal function. The left ventricle has no regional wall motion abnormalities. There is severe left ventricular hypertrophy. Left ventricular diastolic parameters are consistent with Grade I diastolic dysfunction (impaired relaxation).  2. Right ventricular systolic function is normal. The right ventricular size is normal.  3. Left atrial size was moderately dilated.  4. The mitral valve is normal in structure. No evidence of mitral valve regurgitation. No evidence of mitral stenosis.  5. The aortic valve is normal in structure. Aortic valve regurgitation is not visualized. No aortic stenosis is present.  6. The inferior vena cava is normal in size with greater than 50% respiratory variability, suggesting right atrial pressure of 3 mmHg. FINDINGS  Left Ventricle: Mild mid cavitary gradient. Peak velocity 0.94 m/s. Peak gradient 3.5 mmHg. With Valsalva, velocity increases to 1.2 m/s and peak gradient 5.6 mmHg. Left ventricular ejection fraction, by estimation, is 60 to 65%. The left ventricle has normal function. The left ventricle has no regional wall motion abnormalities. The left ventricular internal cavity size was normal in size. There is severe left ventricular hypertrophy. Left ventricular diastolic parameters are consistent with Grade I diastolic dysfunction (impaired relaxation). Normal left ventricular filling pressure. Right Ventricle: The right ventricular size is normal. No increase in right ventricular wall thickness. Right ventricular systolic function is normal. Left Atrium: Left atrial size was moderately dilated. Right Atrium: Right atrial size was normal in size.  Pericardium: There is no evidence of pericardial effusion. Mitral Valve: The mitral valve is normal in structure. No evidence of mitral valve regurgitation. No evidence of mitral valve stenosis. MV peak gradient, 3.9 mmHg. The mean mitral valve gradient is 1.0 mmHg. Tricuspid Valve: The tricuspid valve is normal in structure. Tricuspid  valve regurgitation is not demonstrated. No evidence of tricuspid stenosis. Aortic Valve: The aortic valve is normal in structure. Aortic valve regurgitation is not visualized. No aortic stenosis is present. Aortic valve mean gradient measures 3.0 mmHg. Aortic valve peak gradient measures 6.6 mmHg. Aortic valve area, by VTI measures 1.58 cm. Pulmonic Valve: The pulmonic valve was normal in structure. Pulmonic valve regurgitation is not visualized. No evidence of pulmonic stenosis. Aorta: The aortic root is normal in size and structure. Venous: The inferior vena cava is normal in size with greater than 50% respiratory variability, suggesting right atrial pressure of 3 mmHg. IAS/Shunts: No atrial level shunt detected by color flow Doppler.  LEFT VENTRICLE PLAX 2D LVIDd:         3.40 cm      Diastology LVIDs:         2.10 cm      LV e' medial:    6.42 cm/s LV PW:         1.30 cm      LV E/e' medial:  10.3 LV IVS:        1.80 cm      LV e' lateral:   6.96 cm/s LVOT diam:     1.60 cm      LV E/e' lateral: 9.5 LV SV:         35 LV SV Index:   20 LVOT Area:     2.01 cm  LV Volumes (MOD) LV vol d, MOD A2C: 106.0 ml LV vol d, MOD A4C: 140.0 ml LV vol s, MOD A2C: 38.6 ml LV vol s, MOD A4C: 42.7 ml LV SV MOD A2C:     67.4 ml LV SV MOD A4C:     140.0 ml LV SV MOD BP:      79.7 ml RIGHT VENTRICLE             IVC RV Basal diam:  2.90 cm     IVC diam: 1.40 cm RV Mid diam:    1.60 cm RV S prime:     11.90 cm/s TAPSE (M-mode): 2.4 cm LEFT ATRIUM             Index        RIGHT ATRIUM          Index LA diam:        3.70 cm 2.12 cm/m   RA Area:     7.30 cm LA Vol (A2C):   17.1 ml 9.81 ml/m   RA  Volume:   11.70 ml 6.71 ml/m LA Vol (A4C):   61.0 ml 35.00 ml/m LA Biplane Vol: 34.4 ml 19.74 ml/m  AORTIC VALVE                    PULMONIC VALVE AV Area (Vmax):    1.96 cm     PV Vmax:       0.75 m/s AV Area (Vmean):   1.41 cm     PV Peak grad:  2.3 mmHg AV Area (VTI):     1.58 cm AV Vmax:           128.00 cm/s AV Vmean:          82.200 cm/s AV VTI:            0.224 m AV Peak Grad:      6.6 mmHg AV Mean Grad:      3.0 mmHg LVOT Vmax:         125.00 cm/s LVOT  Vmean:        57.600 cm/s LVOT VTI:          0.176 m LVOT/AV VTI ratio: 0.79  AORTA Ao Root diam: 3.10 cm Ao Asc diam:  2.60 cm MITRAL VALVE MV Area (PHT): 2.59 cm    SHUNTS MV Area VTI:   1.44 cm    Systemic VTI:  0.18 m MV Peak grad:  3.9 mmHg    Systemic Diam: 1.60 cm MV Mean grad:  1.0 mmHg MV Vmax:       0.98 m/s MV Vmean:      55.4 cm/s MV Decel Time: 293 msec MV E velocity: 65.90 cm/s MV A velocity: 91.30 cm/s MV E/A ratio:  0.72 Chilton Si MD Electronically signed by Chilton Si MD Signature Date/Time: 04/13/2023/4:04:49 PM    Final    MR Brain Wo Contrast (neuro protocol) Result Date: 04/12/2023 CLINICAL DATA:  Initial evaluation for acute neuro deficit, stroke suspected. EXAM: MRI HEAD WITHOUT CONTRAST TECHNIQUE: Multiplanar, multiecho pulse sequences of the brain and surrounding structures were obtained without intravenous contrast. COMPARISON:  Prior CT from earlier the same day. FINDINGS: Brain: Cerebral volume within normal limits. No significant cerebral white matter disease for age. Chronic left cerebellar infarct noted. Scattered foci of restricted diffusion are seen involving the left cerebral hemisphere, corresponding with hypodensity seen on prior CT. Findings consistent with acute to early subacute ischemic infarcts, left MCA distribution. No associated hemorrhage or significant mass effect. There are a few underlying late subacute to chronic infarcts superimposed on this region as well (series 5, image 81, 79 for  example). No other evidence for acute or subacute ischemia. No acute intracranial hemorrhage. No mass lesion or midline shift. No hydrocephalus or extra-axial fluid collection. Pituitary gland within normal limits. Vascular: Poor visualization of the left MCA branches, corresponding with attenuated flow noted on prior CT. Major intracranial vascular flow voids are otherwise maintained. Skull and upper cervical spine: Craniocervical junction within normal limits. Bone marrow signal intensity normal. No scalp soft tissue abnormality. Sinuses/Orbits: Globes orbital soft tissues demonstrate no acute finding. Paranasal sinuses are clear. No mastoid effusion. Other: Few subcentimeter T2 hyperintense nodular densities noted about the parotid glands, nonspecific, but possibly small intraparotid lymph nodes. IMPRESSION: 1. Scattered acute to early subacute ischemic infarcts involving the left cerebral hemisphere, left MCA distribution. No associated hemorrhage or significant mass effect. 2. Few scattered superimposed late subacute to chronic left MCA distribution infarcts. 3. Chronic left cerebellar infarct. Electronically Signed   By: Rise Mu M.D.   On: 04/12/2023 19:28   CT ANGIO HEAD NECK W WO CM Result Date: 04/12/2023 CLINICAL DATA:  Loss of cognitive skills, slurred speech EXAM: CT ANGIOGRAPHY HEAD AND NECK WITH AND WITHOUT CONTRAST TECHNIQUE: Multidetector CT imaging of the head and neck was performed using the standard protocol during bolus administration of intravenous contrast. Multiplanar CT image reconstructions and MIPs were obtained to evaluate the vascular anatomy. Carotid stenosis measurements (when applicable) are obtained utilizing NASCET criteria, using the distal internal carotid diameter as the denominator. RADIATION DOSE REDUCTION: This exam was performed according to the departmental dose-optimization program which includes automated exposure control, adjustment of the mA and/or kV  according to patient size and/or use of iterative reconstruction technique. CONTRAST:  75mL OMNIPAQUE IOHEXOL 350 MG/ML SOLN COMPARISON:  No prior CTA available, correlation is made with 03/05/2018 CT head and 12/06/2016 MRA head FINDINGS: CT HEAD FINDINGS Brain: Areas of hypodensity left cerebral hemisphere (series 2, images 17, 19, 21,  and 22), which are new from the prior exam and concerning for acute to subacute infarcts. No evidence of acute hemorrhage, mass, mass effect, or midline shift. No hydrocephalus or extra-axial fluid collection. Vascular: No hyperdense vessel. Skull: Negative for fracture or focal lesion. Sinuses/Orbits: No acute finding. Other: The mastoid air cells are well aerated. CTA NECK FINDINGS Aortic arch: Two-vessel arch with a common origin of the brachiocephalic and left common carotid arteries. Imaged portion shows no evidence of aneurysm or dissection. No significant stenosis of the major arch vessel origins. Right carotid system: No evidence of dissection, occlusion, or hemodynamically significant stenosis (greater than 50%). Left carotid system: No evidence of dissection, occlusion, or hemodynamically significant stenosis (greater than 50%). Vertebral arteries: No evidence of dissection, occlusion, or hemodynamically significant stenosis (greater than 50%). Skeleton: No acute osseous abnormality. Degenerative changes in the cervical spine. Other neck: 3 mm hypoenhancing focus in the right thyroid lobe, for which no follow-up is currently indicated. Upper chest: No focal pulmonary opacity or pleural effusion. Review of the MIP images confirms the above findings CTA HEAD FINDINGS Evaluation is somewhat limited by venous contamination. Anterior circulation: Both internal carotid arteries are patent to the termini, without significant stenosis. A1 segments patent. Normal anterior communicating artery. Anterior cerebral arteries are patent to their distal aspects without significant  stenosis. Short segment occlusion or near occlusive stenosis in left M1 (series 605, images 126-128), with possible reconstitution in the proximal more posterior left M2 (series 605, image 126). The anterior M2 is not well seen. Additional severe stenosis in the insular left M2 (series 605, image 121 and 124), with overall poor opacification distal left MCA branches. Mild stenosis in the proximal right M 1 (series 605, image 129). Right MCA branches are irregular but grossly perfused without high-grade stenosis. Posterior circulation: Vertebral arteries patent to the vertebrobasilar junction without significant stenosis. Basilar patent to its distal aspect without significant stenosis. Superior cerebellar arteries patent proximally. Patent P1 segments. PCAs perfused to their distal aspects without significant stenosis. The bilateral posterior communicating arteries are not visualized. Venous sinuses: Well opacified, patent. Anatomic variants: None significant. No evidence of aneurysm or vascular malformation. Review of the MIP images confirms the above findings IMPRESSION: 1. Areas of hypodensity in the left cerebral hemisphere, which are new from the prior exam and concerning for acute to subacute infarcts. An MRI is recommended for further evaluation. 2. 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 The anterior M2 is not well seen. Additional severe stenosis in the insular left M2, with overall poor opacification of distal left MCA branches. 3. No hemodynamically significant stenosis in the neck. These results were called by telephone at the time of interpretation on 04/12/2023 at 2:53 pm to provider Edwin Dada , who verbally acknowledged these results. Electronically Signed   By: Wiliam Ke M.D.   On: 04/12/2023 14:54       HISTORY OF PRESENT ILLNESS 58 y.o. female with PMH significant for previous left cerebellar stroke, hypertension,  hyperlipidemia, peripheral neuropathy, multiple myeloma, IBS, GERD who presented to med [facility] 1/23 due to 1 to 2 weeks of word finding difficulty and dysarthria.  She was found to have left MCA territory stroke and severe left MCA stenosis.  Patient has a history of prior cerebellar stroke with minimal residual deficits.  Given the multiple days of symptoms patient was not a candidate for acute intervention at that time.  She was  transferred to [facility] for further workup. 1/27, she was found to have acute onset of right gaze deviation, inability to follow commands and global aphasia.  CTA revealed proximal left M1 occlusion.  She was taken for mechanical thrombectomy after discussion with IR and consent being obtained from her son. TICI3 revascularization was achieved with placement of rescue stent.   1/31: Placed on heparin drip due to acute DVT right leg.  Brilinta continued, aspirin stopped.  HOSPITAL COURSE  Acute Ischemic Infarct:  left MCA territory due to left MCA occlusion with underlying stenosis s/p IR with rescue stent, TICI3 revascularization Etiology: RLE DVT in the setting of PFO versus cardioembolic source CT 1/23 left MCA infarct CT head and neck Short segment occlusion or near occlusive stenosis in left M1, with possible reconstitution in the proximal left M2. MRI  1/23 Scattered acute to early subacute ischemic infarcts left cerebral hemisphere, left MCA distribution. Few scattered superimposed late subacute to chronic left MCA distribution infarct. Chronic left cerebellar infarct Symptoms worsened on 1/27 1/27 Code Stroke CT head expected evolution of recently seen ischemic infarct left MCA territory. CTA head & neck Proximal left M1 occlusion with only minimal collateralization of left MCA branch vessels, progressed from 1/23 Status post IR with TICI3 and left MCA stenting Repeat MRI 1/28: acute left MCA infarct with intraparenchymal hemorrhage centered in the left  basal ganglia.  MRA Head 1/28: Left M1 is patent 2D Echo: LVEF 60-65%, Moderately dilated left atria VAS US DVT: Acute DVT Right common femoral vein and SF junction LDL 124 HgbA1c 5.9 UDS positive for THC P2 Y12 = 17 (blood sample collected after morning dose of Brilinta). VTE prophylaxis - lovenox No antithrombotic prior to admission, continue Brilinta (ticagrelor) 90 mg bid, ASA stopped. 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  Carotid Doppler unremarkable.  EF 65 to 70%.  LDL 139, A1c 5.6.  TEE showed possible PFO.  LE venous Doppler no DVT.  Discharged on aspirin and statin.  Loop recorder recommended but never done. 02/2017 had TCD bubble study showed only Spencer degree 1 at rest and with Valsalva  DVT VAS US DVT: Acute DVT Right common femoral vein and SF junction History of a PFO on TEE Put on heparin IV  Hypertension Home meds:  amlodipine 10mg , hydrochlorothiazide 12.5mg  Stable Long-term BP goal normotensive   Hyperlipidemia Home meds:  Lipitor 80mg  LDL 124, goal < 70 Add Zetia  Continue Zetia and statin at discharge  Other Stroke Risk Factors Family hx stroke (maternal uncle) Former smoker Substance abuse, UDS positive for THC, cessation education provided  DISCHARGE EXAM Blood pressure (!) 150/75, pulse (!) 108, temperature 98 F (36.7 C), temperature source Oral, resp. rate 18, height 5\' 2"  (1.575 m), weight 73 kg, SpO2 100%.  PHYSICAL EXAM General:  Alert, well-nourished, well-developed patient in no acute distress Psych:  Mood and affect appropriate for situation CV: Regular rate and rhythm on monitor Respiratory:  Regular, unlabored respirations on room air GI: Abdomen soft and nontender     NEURO:  Mental Status: Alert and responsive, tracks but does not follow commands except open and close eyes per request. She is able to mimic some movements.   Speech/Language: Global aphasia continues.  No verbal response.   Cranial Nerves:  II: PERRL.  No blink to threat on right III, IV, VI: EOMI. Eyelids elevate symmetrically.  V:  Sensation is intact to light touch and symmetrical to face.  VII: Right facial droop VIII: hearing intact to voice. IX, X: Palate elevates symmetrically.  IO:[id number] shrug 5/5. XII: tongue is midline without fasciculations. Motor:  RUE/RLE 1/5, some anti-gravity strength only. Arm weaker than leg.  Tone: is normal and bulk is normal Sensation- Intact to light touch bilaterally. Extinction absent to light touch to DSS.   Coordination: unable to perform  Gait- deferred  Discharge Diet      Diet   DIET DYS 3 Room service appropriate? No; Fluid consistency: Thin   liquids  DISCHARGE PLAN Disposition:  Transfer to [facility] for ongoing PT, OT and ST aspirin 81 mg daily and Brilinta (ticagrelor) 90 mg bid for secondary stroke prevention Recommend ongoing stroke risk factor control by Primary Care Physician at time of discharge from inpatient rehabilitation. Follow-up PCP Clayborne Dana, NP in 2 weeks following discharge from rehab. Follow-up in [facility] in 8 weeks following discharge from rehab, please call office to schedule an appointment.  Follow-up with neurointerventional radiology in 4-8 weeks following discharge from rehab, please call office to schedule an appointment.  35 minutes were spent preparing discharge.   Pt seen by Neuro NP/APP and later by MD. Note/plan to be edited by MD as needed.    Lynnae January, DNP, AGACNP-BC [facility] Please use AMION for contact information & EPIC for messaging.  ATTENDING NOTE: I reviewed above note and agree with the assessment and plan. Pt was seen and examined.   2D echo tech at bedside performing echo.  Patient lying bed, still has global aphasia except able to open and close eyes on  command.  Still has right hemiparesis.  Pending CIR placement.  Initially plan for loop recorder placement, however patient found to have right lower extremity DVT.  Given history of PFO on TEE, started on heparin IV.  Discussed with Dr. Corliss Skains, continue Brilinta but DC aspirin.  Continue statin.  Patient medically ready for CIR placement.  Follow-up at [facility] with Dr. Pearlean Brownie in 4-8 weeks.  For detailed assessment and plan, please refer to above/below as I have made changes wherever appropriate.   Marvel Plan, MD PhD Stroke Neurology 04/20/2023 6:19 PM

## 2023-04-20 NOTE — Plan of Care (Signed)
  Problem: Health Behavior/Discharge Planning: Goal: Goals will be collaboratively established with patient/family Outcome: Progressing   Problem: Self-Care: Goal: Ability to participate in self-care as condition permits will improve Outcome: Progressing   Problem: Nutrition: Goal: Risk of aspiration will decrease Outcome: Progressing Goal: Dietary intake will improve Outcome: Progressing   Problem: Clinical Measurements: Goal: Ability to maintain clinical measurements within normal limits will improve Outcome: Progressing Goal: Will remain free from infection Outcome: Progressing Goal: Cardiovascular complication will be avoided Outcome: Progressing   Problem: Pain Managment: Goal: General experience of comfort will improve and/or be controlled Outcome: Progressing

## 2023-04-21 DIAGNOSIS — N179 Acute kidney failure, unspecified: Secondary | ICD-10-CM | POA: Diagnosis not present

## 2023-04-21 DIAGNOSIS — I10 Essential (primary) hypertension: Secondary | ICD-10-CM | POA: Diagnosis not present

## 2023-04-21 DIAGNOSIS — R1031 Right lower quadrant pain: Secondary | ICD-10-CM | POA: Diagnosis not present

## 2023-04-21 DIAGNOSIS — R109 Unspecified abdominal pain: Secondary | ICD-10-CM

## 2023-04-21 DIAGNOSIS — K5901 Slow transit constipation: Secondary | ICD-10-CM | POA: Diagnosis not present

## 2023-04-21 DIAGNOSIS — I63512 Cerebral infarction due to unspecified occlusion or stenosis of left middle cerebral artery: Secondary | ICD-10-CM | POA: Diagnosis not present

## 2023-04-21 DIAGNOSIS — E876 Hypokalemia: Secondary | ICD-10-CM

## 2023-04-21 LAB — COMPREHENSIVE METABOLIC PANEL
ALT: 24 U/L (ref 0–44)
AST: 40 U/L (ref 15–41)
Albumin: 3.6 g/dL (ref 3.5–5.0)
Alkaline Phosphatase: 96 U/L (ref 38–126)
Anion gap: 17 — ABNORMAL HIGH (ref 5–15)
BUN: 16 mg/dL (ref 6–20)
CO2: 20 mmol/L — ABNORMAL LOW (ref 22–32)
Calcium: 9.3 mg/dL (ref 8.9–10.3)
Chloride: 101 mmol/L (ref 98–111)
Creatinine, Ser: 1.19 mg/dL — ABNORMAL HIGH (ref 0.44–1.00)
GFR, Estimated: 53 mL/min — ABNORMAL LOW (ref >60)
Glucose, Bld: 87 mg/dL (ref 70–99)
Potassium: 3.3 mmol/L — ABNORMAL LOW (ref 3.5–5.1)
Sodium: 138 mmol/L (ref 135–145)
Total Bilirubin: 0.8 mg/dL (ref 0.0–1.2)
Total Protein: 7.2 g/dL (ref 6.5–8.1)

## 2023-04-21 LAB — HEPARIN LEVEL (UNFRACTIONATED)
Heparin Unfractionated: 1.01 [IU]/mL — ABNORMAL HIGH (ref 0.30–0.70)
Heparin Unfractionated: 0.71 [IU]/mL — ABNORMAL HIGH (ref 0.30–0.70)

## 2023-04-21 LAB — CBC WITH DIFFERENTIAL/PLATELET
Abs Immature Granulocytes: 0.01 10*3/uL (ref 0.00–0.07)
Basophils Absolute: 0 10*3/uL (ref 0.0–0.1)
Basophils Relative: 0 %
Eosinophils Absolute: 0 10*3/uL (ref 0.0–0.5)
Eosinophils Relative: 0 %
HCT: 39.1 % (ref 36.0–46.0)
Hemoglobin: 13.3 g/dL (ref 12.0–15.0)
Immature Granulocytes: 0 %
Lymphocytes Relative: 37 %
Lymphs Abs: 1.6 10*3/uL (ref 0.7–4.0)
MCH: 29.2 pg (ref 26.0–34.0)
MCHC: 34 g/dL (ref 30.0–36.0)
MCV: 85.9 fL (ref 80.0–100.0)
Monocytes Absolute: 0.8 10*3/uL (ref 0.1–1.0)
Monocytes Relative: 19 %
Neutro Abs: 1.9 10*3/uL (ref 1.7–7.7)
Neutrophils Relative %: 44 %
Platelets: 181 10*3/uL (ref 150–400)
RBC: 4.55 MIL/uL (ref 3.87–5.11)
RDW: 14.6 % (ref 11.5–15.5)
WBC: 4.4 10*3/uL (ref 4.0–10.5)
nRBC: 0 % (ref 0.0–0.2)

## 2023-04-21 MED ORDER — SORBITOL 70 % SOLN
30.0000 mL | Freq: Every day | Status: DC | PRN
  Administered 2023-05-02: 30 mL via ORAL
  Filled 2023-04-21 (×3): qty 30

## 2023-04-21 MED ORDER — MELATONIN 5 MG PO TABS
5.0000 mg | ORAL_TABLET | Freq: Every evening | ORAL | Status: DC | PRN
  Administered 2023-04-21: 5 mg via ORAL
  Filled 2023-04-21: qty 1

## 2023-04-21 MED ORDER — TRAMADOL HCL 50 MG PO TABS
100.0000 mg | ORAL_TABLET | ORAL | Status: AC
  Administered 2023-04-21: 100 mg via ORAL
  Filled 2023-04-21: qty 2

## 2023-04-21 MED ORDER — TRAZODONE HCL 50 MG PO TABS: 25.0000 mg | ORAL_TABLET | Freq: Every evening | ORAL | Status: DC | PRN

## 2023-04-21 MED ORDER — DOCUSATE SODIUM 100 MG PO CAPS
100.0000 mg | ORAL_CAPSULE | Freq: Two times a day (BID) | ORAL | Status: DC
  Administered 2023-04-21 – 2023-05-03 (×20): 100 mg via ORAL
  Filled 2023-04-21 (×24): qty 1

## 2023-04-21 MED ORDER — ACETAMINOPHEN 325 MG PO TABS
325.0000 mg | ORAL_TABLET | Freq: Once | ORAL | Status: AC
Start: 2023-04-21 — End: 2023-04-21
  Administered 2023-04-21: 325 mg via ORAL
  Filled 2023-04-21: qty 1

## 2023-04-21 MED ORDER — EZETIMIBE 10 MG PO TABS
10.0000 mg | ORAL_TABLET | Freq: Every day | ORAL | Status: DC
  Administered 2023-04-21 – 2023-05-03 (×13): 10 mg via ORAL
  Filled 2023-04-21 (×14): qty 1

## 2023-04-21 MED ORDER — DICYCLOMINE HCL 10 MG PO CAPS
10.0000 mg | ORAL_CAPSULE | Freq: Three times a day (TID) | ORAL | Status: DC
  Administered 2023-04-22 – 2023-05-03 (×35): 10 mg via ORAL
  Filled 2023-04-21 (×35): qty 1

## 2023-04-21 MED ORDER — TRAMADOL HCL 50 MG PO TABS: 50.0000 mg | ORAL_TABLET | ORAL | Status: DC

## 2023-04-21 MED ORDER — POLYETHYLENE GLYCOL 3350 17 G PO PACK
17.0000 g | PACK | Freq: Two times a day (BID) | ORAL | Status: DC
  Administered 2023-04-21 – 2023-05-02 (×17): 17 g via ORAL
  Filled 2023-04-21 (×23): qty 1

## 2023-04-21 MED ORDER — POTASSIUM CHLORIDE CRYS ER 20 MEQ PO TBCR
40.0000 meq | EXTENDED_RELEASE_TABLET | Freq: Every day | ORAL | Status: AC
  Administered 2023-04-21 – 2023-04-22 (×2): 40 meq via ORAL
  Filled 2023-04-21 (×2): qty 2

## 2023-04-21 NOTE — Progress Notes (Signed)
Occupational Therapy Session Note  Patient Details  Name: Brittany [name] MRN: [id number] Date of Birth: 08-Mar-1966  Today's Date: 04/21/2023 OT Individual Time: 5427-0623 OT Individual Time Calculation (min): 48 min   Short Term Goals: Week 1:  OT Short Term Goal 1 (Week 1): STGs=LTGs due to patient's estimated length of stay.  Skilled Therapeutic Interventions/Progress Updates:  Pt received resting in bed, excitedly using communication board/white board although words expressed not coherent at this time, and she is unable to use either to effectively answer questions asked by OT, more-so copying words from communication board onto white board. Pt is able to verbalize "I know" and "what now." At bed-level, pt participates in the following exercises for RUE NMR: Shoulder flexion/extension (1x10 reps)---of note, painful end range Scapular protraction/retraction (1x10 reps) Diagonal abduction/adduction (2x5 reps) to retreive washcloths Gross grip using tan theraputty  Pt remained resting in bed, in care of LPN for PM meds.   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), RUE DVT (no limitations according to H&P) Restrictions Weight Bearing Restrictions Per Provider Order: No   Therapy/Group: Individual Therapy  Lou Cal, OTR/L, MSOT  04/21/2023, 1:06 PM

## 2023-04-21 NOTE — Progress Notes (Signed)
PHARMACY - ANTICOAGULATION CONSULT NOTE  Pharmacy Consult for Heparin Indication: RLE DVT with stroke and PFO   Allergies  Allergen Reactions   Amitriptyline Other (See Comments)    Patient reported that it made her throat feel like its locking up and it also caused issues with her going to the bathroom   Duloxetine Other (See Comments)    Patient reported that it made her throat lock up and it caused her to have issue with going to the bathroom   Naproxen     Vomiting, sweating, abd spasms   Other     States can't take pain meds that end in "cet" or meds that end in "ine" Darvocet/severe vomiting   Beef-Derived Drug Products     Patient has IBS prefers no beef   Pork-Derived Products     Patient has IBS prefers no pork   Darvon [Propoxyphene] Nausea And Vomiting   Hydrocodone Nausea And Vomiting   Lactose Intolerance (Gi)     Bloating, gas, abd pain   Latex Itching and Rash   Oxycodone Nausea And Vomiting   Percocet [Oxycodone-Acetaminophen] Nausea And Vomiting   Topamax [Topiramate]     Memory made her emotional     Patient Measurements: Height: 5\' 2"  (157.5 cm) Weight: 67.3 kg (148 lb 5.9 oz) IBW/kg (Calculated) : 50.1 Heparin Dosing Weight: 67 kg  Vital Signs: Temp: 98.1 F (36.7 C) (02/01 0353) Temp Source: Oral (02/01 0353) BP: 147/76 (02/01 0353) Pulse Rate: 94 (02/01 0353)  Labs: Recent Labs    04/20/23 0704 04/21/23 0605  HGB 12.6 13.3  HCT 37.2 39.1  PLT 180 181  HEPARINUNFRC  --  1.01*  CREATININE 1.07* 1.19*    Estimated Creatinine Clearance: 46.9 mL/min (A) (by C-G formula based on SCr of 1.19 mg/dL (H)).   Medical History: Past Medical History:  Diagnosis Date   Anemia    Arthritis    trigger finger in left hand   Complication of anesthesia    per pt, hard to wake up!   Elevated cholesterol    Female bladder prolapse    per urologist, does not have prolaspe   GERD (gastroesophageal reflux disease)    Heart murmur    pt unsure.    Hiatal hernia    Hypertension    IBS (irritable bowel syndrome)    Multiple myeloma (HCC) 2005   had partial chemo   Peripheral neuropathy    SOB (shortness of breath) on exertion    uses an inhaler   Stroke (HCC) 2017   paralysis left arm/uses a walker    Medications:  Medications Prior to Admission  Medication Sig Dispense Refill Last Dose/Taking   amLODipine (NORVASC) 10 MG tablet Take 1 tablet (10 mg total) by mouth daily. (Patient not taking: Reported on 04/12/2023) 30 tablet 0    atorvastatin (LIPITOR) 80 MG tablet Take 1 tablet (80 mg total) by mouth daily. 90 tablet 3    cholecalciferol (CHOLECALCIFEROL) 25 MCG tablet Take 1 tablet (1,000 Units total) by mouth daily.      docusate sodium (COLACE) 100 MG capsule Take 1 capsule (100 mg total) by mouth daily.      ezetimibe (ZETIA) 10 MG tablet Take 1 tablet (10 mg total) by mouth daily.      gabapentin (NEURONTIN) 300 MG capsule Take 1 capsule (300 mg total) by mouth 3 (three) times daily. 90 capsule 3    heparin 16109 UT/250ML infusion Inject 850 Units/hr into the vein continuous.  hydrALAZINE (APRESOLINE) 50 MG tablet Take 1 tablet (50 mg total) by mouth every 8 (eight) hours. 90 tablet 0    linaclotide (LINZESS) 72 MCG capsule Take 1 capsule (72 mcg total) by mouth daily before breakfast. 90 capsule 1    ticagrelor (BRILINTA) 90 MG TABS tablet Take 1 tablet (90 mg total) by mouth 2 (two) times daily.       Assessment: 58 y.o. F presented with stroke. Now pt found to have RLE DVT. Pt on Lovenox 40mg  daily - last dose 0925. To begin heparin per pharmacy low dose goal. No AC PTA. CBC stable.  Heparin level came back elevated this AM. No issue per Rn. We will decrease rate and check another level. Plan for around 48 hrs then NOAC.   Goal of Therapy:  Heparin level 0.3-0.5 units/ml Monitor platelets by anticoagulation protocol: Yes   Plan:  Decrease heparin gtt to 700 units/hr Will f/u heparin level in 6 hours Daily  heparin level and CBC  Ulyses Southward, PharmD, BCIDP, AAHIVP, CPP Infectious Disease Pharmacist 04/21/2023 7:37 AM

## 2023-04-21 NOTE — Progress Notes (Addendum)
Inpatient Rehabilitation Admission Medication Review by a Pharmacist  A complete drug regimen review was completed for this patient to identify any potential clinically significant medication issues.  High Risk Drug Classes Is patient taking? Indication by Medication  Antipsychotic Yes Compazine - N/V  Anticoagulant Yes Heparin - DVT  Antibiotic No   Opioid No   Antiplatelet Yes Brilinta - CVA  Hypoglycemics/insulin No   Vasoactive Medication Yes Amlodipine/hydralazine - HTN  Chemotherapy No   Other Yes Atorvastatin/zetia - HLD Vit D - supplement Neurontin - neuropathy Linzess - Constipation Simethacone - gas Protonix - GERD Trazodone - sleep     Type of Medication Issue Identified Description of Issue Recommendation(s)  Drug Interaction(s) (clinically significant)     Duplicate Therapy     Allergy     No Medication Administration End Date     Incorrect Dose     Additional Drug Therapy Needed     Significant med changes from prior encounter (inform family/care partners about these prior to discharge).    Other       Clinically significant medication issues were identified that warrant physician communication and completion of prescribed/recommended actions by midnight of the next day:  No  Name of provider notified for urgent issues identified:   Provider Method of Notification:    Pharmacist comments:   Time spent performing this drug regimen review (minutes):  30   Ulyses Southward, PharmD, [place], AAHIVP, CPP Infectious Disease Pharmacist 04/20/2023 1:18 PM

## 2023-04-21 NOTE — Progress Notes (Signed)
Throughout night pt rung bell grunting and moaning, pointing to abdomen. PRNs were administered with no relief, On call physician [address] notified, extra dose of tylenol plus kpad were ordered. Giving pt relief for 4-5 hours.

## 2023-04-21 NOTE — Evaluation (Signed)
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   Elevated cholesterol    Female bladder prolapse    per urologist, does not have prolaspe   GERD (gastroesophageal reflux disease)    Heart murmur    pt unsure.   Hiatal hernia    Hypertension    IBS (irritable bowel syndrome)    Multiple myeloma (HCC) 2005   had partial chemo   Peripheral neuropathy    SOB (shortness of breath) on exertion    uses an inhaler   Stroke (HCC) 2017   paralysis left arm/uses a walker   Past Surgical History:  Past Surgical History:  Procedure Laterality Date   BONE BIOPSY  2005   in her back   COLONOSCOPY     over 10 years x3   ESOPHAGOGASTRODUODENOSCOPY     incomplete-over 10 years ago    IR CT HEAD LTD  04/16/2023   IR CT HEAD LTD  04/16/2023   IR INTRA CRAN STENT  04/16/2023   IR PERCUTANEOUS ART THROMBECTOMY/INFUSION INTRACRANIAL INC DIAG ANGIO  04/16/2023   RADIOLOGY WITH ANESTHESIA N/A 04/16/2023   Procedure: IR WITH ANESTHESIA;  Surgeon: Julieanne Cotton, MD;  Location: [facility] OR;  Service: Radiology;  Laterality: N/A;   TEE WITHOUT CARDIOVERSION N/A 12/08/2016   Procedure: TRANSESOPHAGEAL ECHOCARDIOGRAM (TEE);  Surgeon: Quintella Reichert, MD;  Location: [facility] ENDOSCOPY;  Service: Cardiovascular;  Laterality: N/A;   TUBAL LIGATION     UPPER GASTROINTESTINAL  ENDOSCOPY     WISDOM TOOTH EXTRACTION      Assessment & Plan Clinical Impression: Patient is a 58 y.o. yfemale with history of HTN, GERD, anemia, MM, peripheral neuropathy, IBS, DOE, Left cerebellar CVA;   who was originally admitted on 04/12/23 with  reports of 1-2 weeks of speech changes and decline in cognition.  UDS positive for THC.  CT head done revealing subacute stroke in L-MCA territory and CTA revealed short segment occlusion in proximal Left M2 and additional severe stenosis in insular left M2. MRI brain done revealing  scattered acute and subacute infarcts involving L-MCA distribution, few scattered superimposed subacute to chronic L-MCA infarct and chronic left cerebellar infarct. Neurology recommended DAPT X 3 weeks followed by ASA alone as well as zio patch for stroke felt to bed due to large vessel disease v/s cardioembolic v/s hypercoagulopathy. Not felt to be TNK candidate due to duration of symptoms.    Expressive deficits were improving but on 01/27, patient developed global aphasia with right facial droop and right sided weakness, left gaze preference and right hemianopia.  Follow up CTA showed proximal Left M1 occlusion with minimal collaterization of L-MCA which had progressed from 01/23. She underwent cerebral angio with mechanical thrombectomy of occluded L-MCA M1 segment with stent placement by Dr. Corliss Skains with TIC! 3 revascularization.  Post procedure on ASA/Brilinta bid    2D echo with  EF 60-65% with severe LVH, moderately dilated left atria. BSE done and patient started on D3, thins. Noted to have abdominal pain today limiting activity question IBS flare v/s constipation? She does have hx of small PFO noted on TEE in the past and BLE dopplers done which was positive for acute DVT in R-CFV and SF junction. Results dicussed with Dr. Roda Shutters who recommended IV heparin X 2 days and if stable to transition to DOAC--no limitation inactivity.    Patient with resultant right sided  weakness with sensory deficits, right inattention due to hemiopia, global aphasia and fatigue affecting mobility, ADLs and communication. CIR recommended due to functional decline. Patient transferred to CIR on 04/20/2023 .   Patient currently requires min assist with mobility secondary to muscle weakness, decreased cardiorespiratoy endurance, unbalanced muscle activation, decreased attention to right, decreased problem solving, decreased safety awareness, and delayed processing, and decreased standing balance, decreased balance strategies, and hemipareisis .  Prior to hospitalization, patient was modified independent  with mobility and lived with Alone in a House home.  Home access is  Level entry.  Patient will benefit from skilled PT intervention to maximize safe functional mobility, minimize fall risk, and decrease caregiver burden for planned discharge home with 24 hour supervision.  Anticipate patient will benefit from follow up HH at discharge.  PT - End of Session Activity Tolerance: Tolerates 30+ min activity with multiple rests Endurance Deficit: Yes Endurance Deficit Description: multiple rest-breaks required, fatigues easily PT Assessment Rehab Potential (ACUTE/IP ONLY): Good PT Barriers to Discharge: Insurance for SNF coverage;Medication compliance;Other (comments) (aphasia) PT Patient demonstrates impairments in the following area(s): Balance;Endurance;Motor;Perception;Sensory PT Transfers Functional Problem(s): Bed Mobility;Bed to Chair;Car;Furniture PT Locomotion Functional Problem(s): Ambulation;Stairs PT Plan PT Intensity: Minimum of 1-2 x/day ,45 to 90 minutes PT Frequency: 5 out of 7 days PT Duration Estimated Length of Stay: 10-12 days PT Treatment/Interventions: Ambulation/gait training;Cognitive remediation/compensation;Discharge planning;DME/adaptive equipment instruction;Functional mobility training;Pain management;Psychosocial support;Splinting/orthotics;Therapeutic  Activities;UE/LE Strength taining/ROM;Visual/perceptual remediation/compensation;UE/LE Coordination activities;Therapeutic Exercise;Stair training;Skin care/wound management;Patient/family education;Neuromuscular re-education;Disease management/prevention;Community reintegration;Balance/vestibular training PT Transfers Anticipated Outcome(s): IND PT Locomotion Anticipated Outcome(s): mod I/ supervision PT Recommendation Recommendations for Other Services: Speech consult;Therapeutic Recreation consult Therapeutic Recreation Interventions: Warehouse manager;Outing/community reintergration Follow Up Recommendations: Home health PT;24 hour supervision/assistance;Outpatient PT Patient destination: Home Equipment Recommended: To be determined   PT Evaluation Precautions/Restrictions Precautions Precautions: Fall Precaution Comments: R-hemi, R-inattention, watch BP, global aphasia (expressive>receptive), RUE DVT (no limitations according to H&P) Restrictions Weight Bearing Restrictions Per Provider Order: No General   Vital SignsTherapy Vitals Temp: 98.2 F (36.8 C) Resp: 18 BP: (!) 158/71 Patient Position (if appropriate): Lying Oxygen Therapy O2 Device: Room Air Pain Pain Assessment Pain Scale: 0-10 Pain Score: 0-No pain Faces Pain Scale: Hurts little more Pain Interference Pain Interference Pain Effect on Sleep: 1. Rarely or not at all Pain Interference with Therapy Activities: 1. Rarely or not at all Pain Interference with Day-to-Day Activities: 1. Rarely or not at all Home Living/Prior Functioning Home Living Available Help at Discharge: Family;Personal care attendant;Available PRN/intermittently Type of Home: House Home Access: Level entry Home Layout: One level Bathroom Shower/Tub: Engineer, manufacturing systems: Handicapped height Bathroom Accessibility: Yes Additional Comments: pt has one son is in [place], parents are in [place]. Pt has a new PCA.  Lives  With: Alone Prior Function Level of Independence: Needs assistance with ADLs;Needs assistance with homemaking;Requires assistive device for independence  Able to Take Stairs?: Yes Vision/Perception  Vision - History Ability to See in Adequate Light: 1 Impaired Vision -  Assessment Eye Alignment: Within Functional Limits Ocular Range of Motion: Within Functional Limits Alignment/Gaze Preference: Within Defined Limits Tracking/Visual Pursuits: Able to track stimulus in all quads without difficulty Perception Perception: Impaired Preception Impairment Details: Inattention/Neglect Praxis Praxis: WFL  Cognition Overall Cognitive Status: Impaired/Different from baseline Arousal/Alertness: Awake/alert Orientation Level: Oriented to person;Oriented to place;Oriented to situation Memory: Impaired Awareness: Appears intact Problem Solving: Impaired Safety/Judgment: Impaired Comments: Slight impulsivity during mobility. Sensation Sensation Light Touch: Impaired Detail Peripheral sensation comments: History of peripheral neuropathy Light Touch Impaired Details: Impaired RLE;Impaired LLE Proprioception: Impaired by gross assessment Coordination Gross Motor Movements are Fluid and Coordinated: No Fine Motor Movements are Fluid and Coordinated: No Coordination and Movement Description: Deficits d/t mild R-sided hemiparesis and mild R-sided inattention. Heel Shin Test: difficult to assess d/t global aphasia and novel task Motor  Motor Motor: Other (comment) (hemipareisis) Motor - Skilled Clinical Observations: R-sided hemiparesis with mild R-sided inattention.   Trunk/Postural Assessment  Cervical Assessment Cervical Assessment: Exceptions to WFL (slight Forward head) Thoracic Assessment Thoracic Assessment: Exceptions to [facility] (mild rounded shoulders) Lumbar Assessment Lumbar Assessment: Within Functional Limits Postural Control Postural Control: Deficits on evaluation Righting  Reactions: Delayed Protective Responses: Delayed  Balance Balance Balance Assessed: Yes Static Sitting Balance Static Sitting - Balance Support: Feet supported Static Sitting - Level of Assistance: 5: Stand by assistance Dynamic Sitting Balance Dynamic Sitting - Balance Support: During functional activity;Feet supported Dynamic Sitting - Level of Assistance: 5: Stand by assistance Static Standing Balance Static Standing - Balance Support: During functional activity;No upper extremity supported Static Standing - Level of Assistance: 5: Stand by assistance Dynamic Standing Balance Dynamic Standing - Balance Support: During functional activity;No upper extremity supported Dynamic Standing - Level of Assistance: Other (comment) (CGA/ MinA) Extremity Assessment      RLE Assessment RLE Assessment: Exceptions to [facility] RLE Strength RLE Overall Strength: Deficits Right Hip Flexion: 3-/5 Right Hip Extension: 3+/5 Right Hip ABduction: 4/5 Right Hip ADduction: 3-/5 Right Knee Flexion: 3-/5 Right Knee Extension: 3+/5 Right Ankle Dorsiflexion: 3-/5 Right Ankle Plantar Flexion: 3-/5 LLE Assessment LLE Assessment: Within Functional Limits LLE Strength Left Hip Flexion: 4+/5 Left Hip Extension: 4/5 Left Hip ABduction: 4+/5 Left Hip ADduction: 4/5 Left Knee Flexion: 4/5 Left Knee Extension: 4+/5 Left Ankle Dorsiflexion: 4/5 Left Ankle Plantar Flexion: 4/5  Care Tool Care Tool Bed Mobility Roll left and right activity   Roll left and right assist level: Supervision/Verbal cueing    Sit to lying activity   Sit to lying assist level: Supervision/Verbal cueing    Lying to sitting on side of bed activity   Lying to sitting on side of bed assist level: the ability to move from lying on the back to sitting on the side of the bed with no back support.: Supervision/Verbal cueing     Care Tool Transfers Sit to stand transfer   Sit to stand assist level: Contact Guard/Touching assist     Chair/bed transfer   Chair/bed transfer assist level: Minimal Assistance - Patient > 75%    Car transfer   Car transfer assist level: Contact Guard/Touching assist      Care Tool Locomotion Ambulation   Assist level: Minimal Assistance - Patient > 75% Assistive device: Hand held assist (HHA or using IV pole) Max distance: 75 ft  Walk 10 feet activity   Assist level: Contact Guard/Touching assist Assistive device: Other (comment) (IV pole)   Walk 50 feet with 2 turns activity   Assist level: Minimal Assistance - Patient >  75% Assistive device: Other (comment) (IV pole)  Walk 150 feet activity Walk 150 feet activity did not occur: Safety/medical concerns      Walk 10 feet on uneven surfaces activity Walk 10 feet on uneven surfaces activity did not occur: Safety/medical concerns      Stairs   Assist level: Contact Guard/Touching assist Stairs assistive device: 2 hand rails Max number of stairs: 8  Walk up/down 1 step activity   Walk up/down 1 step (curb) assist level: Contact Guard/Touching assist Walk up/down 1 step or curb assistive device: 2 hand rails  Walk up/down 4 steps activity   Walk up/down 4 steps assist level: Contact Guard/Touching assist Walk up/down 4 steps assistive device: 2 hand rails  Walk up/down 12 steps activity Walk up/down 12 steps activity did not occur: Safety/medical concerns      Pick up small objects from floor   Pick up small object from the floor assist level: Moderate Assistance - Patient 50 - 74%    Wheelchair Is the patient using a wheelchair?: Yes Type of Wheelchair: Manual   Wheelchair assist level: Dependent - Patient 0% Max wheelchair distance: 250  Wheel 50 feet with 2 turns activity   Assist Level: Dependent - Patient 0%  Wheel 150 feet activity   Assist Level: Dependent - Patient 0%    Refer to Care Plan for Long Term Goals  SHORT TERM GOAL WEEK 1 PT Short Term Goal 1 (Week 1): Pt will perform bed mobility from flat bed  surface and no rails with Mod I. PT Short Term Goal 2 (Week 1): Pt will perform functional transfers with no AD and supervision. PT Short Term Goal 3 (Week 1): Pt will ambulate short, household distances with no AD and close supervision. PT Short Term Goal 4 (Week 1): Pt will ambulate community distances using rollator with supervision. PT Short Term Goal 5 (Week 1): Pt wil complete outcome measure.  Recommendations for other services: Adult nurse group, Stress management, and Outing/community reintegration and Other: Speech Therapy  Skilled Therapeutic Intervention Mobility Bed Mobility Bed Mobility: Supine to Sit;Sit to Supine Supine to Sit: Supervision/Verbal cueing Sit to Supine: Supervision/Verbal cueing Transfers Transfers: Sit to Stand;Stand to Sit;Stand Pivot Transfers Sit to Stand: Contact Guard/Touching assist Stand to Sit: Contact Guard/Touching assist Stand Pivot Transfers: Contact Guard/Touching assist;Minimal Assistance - Patient > 75% Stand Pivot Transfer Details: Verbal cues for precautions/safety Transfer (Assistive device): 1 person hand held assist Locomotion  Gait Ambulation: Yes Gait Assistance: Contact Guard/Touching assist;Minimal Assistance - Patient > 75% Gait Distance (Feet): 75 Feet Assistive device: 1 person hand held assist Gait Assistance Details: Verbal cues for precautions/safety Gait Gait: Yes Gait Pattern: Decreased stance time - right;Decreased step length - right;Decreased weight shift to right Gait velocity: decreased Stairs / Additional Locomotion Stairs: Yes Stairs Assistance: Contact Guard/Touching assist Stair Management Technique: Two rails;Alternating pattern;Step to pattern;Forwards Number of Stairs: 8 Height of Stairs: 6 Ramp: Contact Guard/touching assist Wheelchair Mobility Wheelchair Mobility: No  Skilled Intervention: PT Evaluation completed; see above for results. PT educated patient in roles of PT vs OT,  PT POC, rehab potential, rehab goals, and discharge recommendations along with recommendation for follow-up rehabilitation services. Individual treatment initiated:  Patient supine in bed and asleep upon PT arrival. IV heparin running. Pt requires significant time and verbal/ tactile stimulation in order to fully wake and maintain alertness.   Is able to respond with verbal yes/ no and a few other rote responses correctly. But when asked  for specific answers, is unable to verbalize correctly or not at all.   Patient then alert and agreeable to PT session. No pain complaint during session.  Therapeutic Activity: Bed Mobility: Patient performed supine <> sit with overall supervision and slight increase in time. Performs to R side of bed. Maintains sitting balance with overall supervision.  Provided vc/ tc for supine>sit  in order to increase effort and maintain breathing and safety. Transfers: Patient performed sit <> stand from EOB with CGA/ MinA to no AD but HHA from therapist. Then pivot transfer initially requires MinA and HHA to complete. Improves to overall CGA with vc/tc for use of BUE on armrests of w/c.   Gait Training:  Patient ambulated to bathroom and back to EOB using IV pole for AD in LUE. Is able to use BUE to manage doffing of jeans and underwear. Requires MinA to complete donning pants and underwear with RUE.   Pt then able to complete ambulation up to 60' x1/ 75' x1 using no AD but RUE HHA with +2 managing w/c and IV pole. Requires overall CGA with up to MinA to maintain balance during ambulation bouts. Provided vc/tc for increasing step height of RLE.   Pt is able to ambulate up/ down eight 6-in steps using BUE on BHR with CGA overall  and self adjust stepping and alternates within step-to and reciprocal step pattern on steps. Requires offer to sit between but pushed self to complete.   Neuromuscular Re-ed: NMR facilitated during session with focus on standing balance and  proprioception. Pt guided in reaching activity and is able to slowly but correctly use stepping strategy when needed to reach object. NMR performed for improvements in motor control and coordination, balance, sequencing, judgement, and self confidence/ efficacy in performing all aspects of mobility at highest level of independence.   On return to room, pt sees parents and older sister and begins to cry d/t happiness in seeing them. Pt demos ability to respond to them with simple one-word answers correctly ~80% of time. Then when asked specific questions about who is in room, is unable to correctly verbalize. Demos ambulation with no AD and family is extremely impressed with today's abilities. Is able to return to supine with supervision. Pt provided with speech board with pictures and written descriptors of picture. Mom provides pt with 3.5+ readers and she is better able to see words on paper. Able to correctly read a few words including yes/ no, book, doctor. Pt guided in word finding and family impress with pt's progress. Mother wanting to make cards with family pictures and names and family member noted on picture. Agreed that this may be a good idea for pt. Provided pt with white board/ marker for practice in writing.   Patient supine in bed at end of session with brakes locked, bed alarm set, and all needs within reach. Pt's family surround pt and attempt to encourage her to read words or copy spelling family members names.    Discharge Criteria: Patient will be discharged from PT if patient refuses treatment 3 consecutive times without medical reason, if treatment goals not met, if there is a change in medical status, if patient makes no progress towards goals or if patient is discharged from hospital.  The above assessment, treatment plan, treatment alternatives and goals were discussed and mutually agreed upon: by patient and by family  Loel Dubonnet PT, DPT, CSRS 04/21/2023, 5:35 PM

## 2023-04-21 NOTE — Evaluation (Signed)
Occupational Therapy Assessment and Plan  Patient Details  Name: Brittany [name] MRN: [id number] Date of Birth: 06/13/1965  OT Diagnosis: acute pain, muscle weakness (generalized), hemiparesis affecting dominant side, communication deficits Rehab Potential: Rehab Potential (ACUTE ONLY): Good ELOS: 10-12 days   Today's Date: 04/21/2023 OT Individual Time: 0800-0910 OT Individual Time Calculation (min): 70 min     Hospital Problem: Principal Problem:   Acute ischemic left middle cerebral artery (MCA) stroke (HCC)   Past Medical History:  Past Medical History:  Diagnosis Date   Anemia    Arthritis    trigger finger in left hand   Complication of anesthesia    per pt, hard to wake up!   Elevated cholesterol    Female bladder prolapse    per urologist, does not have prolaspe   GERD (gastroesophageal reflux disease)    Heart murmur    pt unsure.   Hiatal hernia    Hypertension    IBS (irritable bowel syndrome)    Multiple myeloma (HCC) 2005   had partial chemo   Peripheral neuropathy    SOB (shortness of breath) on exertion    uses an inhaler   Stroke (HCC) 2017   paralysis left arm/uses a walker   Past Surgical History:  Past Surgical History:  Procedure Laterality Date   BONE BIOPSY  2005   in her back   COLONOSCOPY     over 10 years x3   ESOPHAGOGASTRODUODENOSCOPY     incomplete-over 10 years ago    IR CT HEAD LTD  04/16/2023   IR CT HEAD LTD  04/16/2023   IR INTRA CRAN STENT  04/16/2023   IR PERCUTANEOUS ART THROMBECTOMY/INFUSION INTRACRANIAL INC DIAG ANGIO  04/16/2023   RADIOLOGY WITH ANESTHESIA N/A 04/16/2023   Procedure: IR WITH ANESTHESIA;  Surgeon: Julieanne Cotton, MD;  Location: [facility] OR;  Service: Radiology;  Laterality: N/A;   TEE WITHOUT CARDIOVERSION N/A 12/08/2016   Procedure: TRANSESOPHAGEAL ECHOCARDIOGRAM (TEE);  Surgeon: Quintella Reichert, MD;  Location: [facility] ENDOSCOPY;  Service: Cardiovascular;  Laterality: N/A;   TUBAL LIGATION     UPPER  GASTROINTESTINAL ENDOSCOPY     WISDOM TOOTH EXTRACTION      Assessment & Plan Clinical Impression: Patient is a 58 year old female with history of HTN, GERD, anemia, MM, peripheral neuropathy, IBS, DOE, Left cerebellar CVA;   who was originally admitted on 04/12/23 with  reports of 1-2 weeks of speech changes and decline in cognition.  UDS positive for THC.  CT head done revealing subacute stroke in L-MCA territory and CTA revealed short segment occlusion in proximal Left M2 and additional severe stenosis in insular left M2. MRI brain done revealing  scattered acute and subacute infarcts involving L-MCA distribution, few scattered superimposed subacute to chronic L-MCA infarct and chronic left cerebellar infarct. Neurology recommended DAPT X 3 weeks followed by ASA alone as well as zio patch for stroke felt to bed due to large vessel disease v/s cardioembolic v/s hypercoagulopathy. Not felt to be TNK candidate due to duration of symptoms.    Expressive deficits were improving but on 01/27, patient developed global aphasia with right facial droop and right sided weakness, left gaze preference and right hemianopia. Follow up CTA showed proximal Left M1 occlusion with minimal collaterization of L-MCA which had progressed from 01/23. She underwent cerebral angio with mechanical thrombectomy of occluded L-MCA M1 segment with stent placement by Dr. Corliss Skains with TIC! 3 revascularization.  Post procedure on ASA/Brilinta bid 2D echo with EF  60-65% with severe LVH, moderately dilated left atria. BSE done and patient started on D3, thins. Noted to have abdominal pain today limiting activity question IBS flare v/s constipation? She does have hx of small PFO noted on TEE in the past and BLE dopplers done which was positive for acute DVT in R-CFV and SF junction. Results dicussed with Dr. Roda Shutters who recommended IV heparin X 2 days and if stable to transition to DOAC--no limitation inactivity.    Patient with resultant  right sided weakness with sensory deficits, right inattention due to hemiopia, global aphasia and fatigue affecting mobility, ADLs and communicationPatient transferred to CIR on 04/20/2023 .    Patient currently requires min with basic self-care skills secondary to muscle weakness, decreased cardiorespiratoy endurance, impaired timing and sequencing, unbalanced muscle activation, and decreased coordination, decreased visual perceptual skills, decreased attention to left, decreased awareness and decreased safety awareness, and decreased standing balance and decreased balance strategies.  Prior to hospitalization, patient could complete BADLs with Mod I.   Patient will benefit from skilled intervention to increase independence with basic self-care skills prior to discharge home with care partner.  Anticipate patient will require 24 hour supervision and follow up home health.  OT - End of Session Activity Tolerance: Tolerates < 10 min activity with changes in vital signs Endurance Deficit: Yes Endurance Deficit Description: HR into 100s with ADL participation, requiring multiple rest-breaks throughout activities. OT Assessment Rehab Potential (ACUTE ONLY): Good OT Barriers to Discharge: Decreased caregiver support;Lack of/limited family support;Other (comments) (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)) OT Patient demonstrates impairments in the following area(s): Balance;Cognition;Behavior;Endurance;Motor;Pain;Perception;Safety;Sensory OT Basic ADL's Functional Problem(s): Bathing;Dressing;Toileting OT Transfers Functional Problem(s): Toilet;Tub/Shower OT Additional Impairment(s): Fuctional Use of Upper Extremity OT Plan OT Intensity: Minimum of 1-2 x/day, 45 to 90 minutes OT Frequency: 5 out of 7 days OT Duration/Estimated Length of Stay: 7-10 days OT Treatment/Interventions: Balance/vestibular training;Cognitive remediation/compensation;Neuromuscular re-education;Community reintegration;Pain  management;Disease mangement/prevention;Discharge planning;DME/adaptive equipment instruction;Psychosocial support;Patient/family education;Therapeutic Exercise;UE/LE Strength taining/ROM;Therapeutic Activities;Splinting/orthotics;Wheelchair propulsion/positioning;Visual/perceptual remediation/compensation;Skin care/wound Risk analyst;Functional mobility training;Functional electrical stimulation;Self Care/advanced ADL retraining;UE/LE Coordination activities OT Basic Self-Care Anticipated Outcome(s): Supervision OT Toileting Anticipated Outcome(s): Supervision OT Bathroom Transfers Anticipated Outcome(s): Supervision OT Recommendation Recommendations for Other Services: Speech consult Patient destination: Home Follow Up Recommendations: Outpatient OT Equipment Recommended: To be determined   OT Evaluation Precautions/Restrictions  Precautions Precautions: Fall Precaution Comments: R-hemi, R-in attention, watch BP, global aphasia (expressive>receptive), RUE DVT (no limitations according to H&P) Restrictions Weight Bearing Restrictions Per Provider Order: No General Chart Reviewed: Yes Family/Caregiver Present: No Vital Signs Therapy Vitals Pulse Rate: (!) 105 BP: (!) 143/83 Patient Position (if appropriate):  (With activity) Oxygen Therapy O2 Device: Room Air Pain Pain Assessment Pain Scale: Faces Pain Score: Asleep Faces Pain Scale: Hurts whole lot Pain Type: Acute pain Pain Location: Abdomen Pain Orientation: Right;Lower Pain Onset: Unable to tell Pain Intervention(s): Medication (See eMAR) Home Living/Prior Functioning Home Living Family/patient expects to be discharged to:: Private residence Living Arrangements: Alone Vision Baseline Vision/History: 1 Wears glasses (Reports wearing glasses) Ability to See in Adequate Light: 0 Adequate Patient Visual Report: Blurring of vision;Eye fatigue/eye pain/headache Vision Assessment?: Yes Eye Alignment: Within Functional  Limits Ocular Range of Motion: Within Functional Limits Alignment/Gaze Preference: Within Defined Limits Tracking/Visual Pursuits: Able to track stimulus in all quads without difficulty Saccades: Additional head turns occurred during testing;Decreased speed of saccadic movement Convergence: Impaired - to be further tested in functional context Visual Fields: No apparent deficits Perception  Perception: Impaired Praxis Praxis: WFL Cognition Cognition Overall Cognitive Status: Difficult to assess  Arousal/Alertness: Awake/alert Behaviors: Restless;Impulsive;Poor frustration tolerance Safety/Judgment: Impaired Comments: Slight impulsivity during mobility. Brief Interview for Mental Status (BIMS) Repetition of Three Words (First Attempt): No answer (Unable to answer due to aphasia.) Temporal Orientation: Year: No answer Temporal Orientation: Month: No answer Temporal Orientation: Day: No answer Recall: "Sock": No answer Recall: "Blue": No answer Recall: "Bed": No answer BIMS Summary Score: 99 Sensation Sensation Light Touch: Impaired Detail Peripheral sensation comments: History of peripheral nueropathy Light Touch Impaired Details: Impaired RLE;Impaired LLE Hot/Cold: Appears Intact Proprioception: Impaired by gross assessment Stereognosis: Impaired by gross assessment Coordination Gross Motor Movements are Fluid and Coordinated: No Fine Motor Movements are Fluid and Coordinated: No Coordination and Movement Description: Deficits due to R-sided hemiparesis and mild R-sided inattention. Motor  Motor Motor - Skilled Clinical Observations: Deficits due to R-sided hemiparesis and mild R-sided inattention.  Trunk/Postural Assessment  Cervical Assessment Cervical Assessment: Exceptions to [facility] (Forward head) Thoracic Assessment Thoracic Assessment: Exceptions to [facility] (Rounded shoulders) Lumbar Assessment Lumbar Assessment: Exceptions to [facility] (Posterior pelvic tilt) Postural  Control Postural Control: Deficits on evaluation Righting Reactions: Delayed Protective Responses: Delayed  Balance Balance Balance Assessed: Yes Static Sitting Balance Static Sitting - Balance Support: Feet supported Static Sitting - Level of Assistance: 5: Stand by assistance (SUP) Dynamic Sitting Balance Dynamic Sitting - Balance Support: During functional activity Dynamic Sitting - Level of Assistance: 5: Stand by assistance (SUP-CGA) Dynamic Sitting - Balance Activities: Lateral lean/weight shifting;Forward lean/weight shifting;Reaching for objects Static Standing Balance Static Standing - Balance Support: During functional activity;No upper extremity supported Static Standing - Level of Assistance: 5: Stand by assistance (CGA) Dynamic Standing Balance Dynamic Standing - Balance Support: During functional activity;No upper extremity supported Dynamic Standing - Level of Assistance: 5: Stand by assistance;4: Min assist (CGA-Min A) Dynamic Standing - Balance Activities: Lateral lean/weight shifting;Forward lean/weight shifting;Reaching for objects Extremity/Trunk Assessment RUE Assessment RUE Assessment: Exceptions to [facility] Active Range of Motion (AROM) Comments: ~90 degrees of flexion & ~75 degrees of abduction General Strength Comments: Proximal strength: 2/5 RUE Body System: Neuro Brunstrum levels for arm and hand: Arm;Hand Brunstrum level for arm: Stage V Relative Independence from Synergy Brunstrum level for hand: Stage VI Isolated joint movements LUE Assessment LUE Assessment: Within Functional Limits  Care Tool Care Tool Self Care Eating   Eating Assist Level: Supervision/Verbal cueing    Oral Care    Oral Care Assist Level: Supervision/Verbal cueing    Bathing   Body parts bathed by patient: Right arm;Left arm;Chest;Abdomen;Front perineal area;Buttocks;Right upper leg;Left upper leg;Right lower leg;Left lower leg;Face     Assist Level: Contact Guard/Touching  assist    Upper Body Dressing(including orthotics)   What is the patient wearing?: Hospital gown only   Assist Level: Minimal Assistance - Patient > 75%    Lower Body Dressing (excluding footwear)   What is the patient wearing?: Underwear/pull up;Pants Assist for lower body dressing: Minimal Assistance - Patient > 75%    Putting on/Taking off footwear   What is the patient wearing?: Socks Assist for footwear: Total Assistance - Patient < 25%       Care Tool Toileting Toileting activity   Assist for toileting: Contact Guard/Touching assist     Care Tool Bed Mobility Roll left and right activity        Sit to lying activity        Lying to sitting on side of bed activity         Care Tool Transfers Sit to stand transfer  Chair/bed transfer         Toilet transfer   Assist Level: Contact Guard/Touching assist     Care Tool Cognition  Expression of Ideas and Wants Expression of Ideas and Wants: 1. Rarely/Never expressess or very difficult - rarely/never expresses self or speech is very difficult to understand  Understanding Verbal and Non-Verbal Content Understanding Verbal and Non-Verbal Content: 3. Usually understands - understands most conversations, but misses some part/intent of message. Requires cues at times to understand   Memory/Recall Ability Memory/Recall Ability : None of the above were recalled   Refer to Care Plan for Long Term Goals  SHORT TERM GOAL WEEK 1 OT Short Term Goal 1 (Week 1): STGs=LTGs due to patient's estimated length of stay.  Recommendations for other services: None    Skilled Therapeutic Intervention  Session began with introduction to OT role, OT POC, and general orientation to rehab unit/schedule. Pt completes full-body sponge-bathing with levels of assistance noted below. Pt most notably limited by abdominal/gastro-intestinal pain this session, politely requesting to return to bed at end of session. Patient provided with  communication board and made full-supervision for meals until further denoted by SLP evaluation. All needs met, bed alarm activate.   ADL ADL Eating: Supervision/safety;Set up Where Assessed-Eating: Chair Grooming: Supervision/safety;Setup Where Assessed-Grooming: Sitting at sink Upper Body Bathing: Setup;Supervision/safety Where Assessed-Upper Body Bathing: Sitting at sink Lower Body Bathing: Contact guard;Minimal assistance Where Assessed-Lower Body Bathing: Standing at sink;Sitting at sink Upper Body Dressing: Minimal assistance (Due to Heparin IV) Where Assessed-Upper Body Dressing: Sitting at sink Lower Body Dressing: Minimal assistance Where Assessed-Lower Body Dressing: Standing at sink Toileting: Contact guard Where Assessed-Toileting: Bedside Commode Toilet Transfer: Contact guard;Minimal assistance Toilet Transfer Method: Stand pivot Toilet Transfer Equipment: Bedside commode Tub/Shower Transfer: Unable to assess Film/video editor: Unable to assess Mobility  Bed Mobility Bed Mobility: Supine to Sit Supine to Sit: Supervision/Verbal cueing Transfers Sit to Stand: Contact Guard/Touching assist Stand to Sit: Contact Guard/Touching assist   Discharge Criteria: Patient will be discharged from OT if patient refuses treatment 3 consecutive times without medical reason, if treatment goals not met, if there is a change in medical status, if patient makes no progress towards goals or if patient is discharged from hospital.  The above assessment, treatment plan, treatment alternatives and goals were discussed and mutually agreed upon: by patient  Lou Cal, OTR/L, MSOT  04/21/2023, 9:01 AM

## 2023-04-21 NOTE — Progress Notes (Signed)
Patient is seen crying and grimacing in pain. Started palpating her right side stomach.   Patient given tylenol and simethicone PRN, encourage to sit up in bed to promote flatulence.   Refused sitting up in bed and would put the head of bed back down herself. Kpad was placed on right side abdomen.   MD notified.

## 2023-04-21 NOTE — Plan of Care (Signed)
  Problem: RH Balance Goal: LTG Patient will maintain dynamic standing with ADLs (OT) Description: LTG:  Patient will maintain dynamic standing balance with assist during activities of daily living (OT)  Flowsheets (Taken 04/21/2023 1302) LTG: Pt will maintain dynamic standing balance during ADLs with: Supervision/Verbal cueing   Problem: RH Bathing Goal: LTG Patient will bathe all body parts with assist levels (OT) Description: LTG: Patient will bathe all body parts with assist levels (OT) Flowsheets (Taken 04/21/2023 1302) LTG: Pt will perform bathing with assistance level/cueing: Supervision/Verbal cueing   Problem: RH Dressing Goal: LTG Patient will perform upper body dressing (OT) Description: LTG Patient will perform upper body dressing with assist, with/without cues (OT). Flowsheets (Taken 04/21/2023 1302) LTG: Pt will perform upper body dressing with assistance level of: Independent with assistive device Goal: LTG Patient will perform lower body dressing w/assist (OT) Description: LTG: Patient will perform lower body dressing with assist, with/without cues in positioning using equipment (OT) Flowsheets (Taken 04/21/2023 1302) LTG: Pt will perform lower body dressing with assistance level of: Supervision/Verbal cueing   Problem: RH Toileting Goal: LTG Patient will perform toileting task (3/3 steps) with assistance level (OT) Description: LTG: Patient will perform toileting task (3/3 steps) with assistance level (OT)  Flowsheets (Taken 04/21/2023 1302) LTG: Pt will perform toileting task (3/3 steps) with assistance level: Supervision/Verbal cueing   Problem: RH Functional Use of Upper Extremity Goal: LTG Patient will use RT/LT upper extremity as a (OT) Description: LTG: Patient will use right/left upper extremity as a stabilizer/gross assist/diminished/nondominant/dominant level with assist, with/without cues during functional activity (OT) Flowsheets (Taken 04/21/2023 1302) LTG: Use of  upper extremity in functional activities: RUE as nondominant level LTG: Pt will use upper extremity in functional activity with assistance level of: Independent   Problem: RH Toilet Transfers Goal: LTG Patient will perform toilet transfers w/assist (OT) Description: LTG: Patient will perform toilet transfers with assist, with/without cues using equipment (OT) Flowsheets (Taken 04/21/2023 1302) LTG: Pt will perform toilet transfers with assistance level of: Supervision/Verbal cueing   Problem: RH Tub/Shower Transfers Goal: LTG Patient will perform tub/shower transfers w/assist (OT) Description: LTG: Patient will perform tub/shower transfers with assist, with/without cues using equipment (OT) Flowsheets (Taken 04/21/2023 1302) LTG: Pt will perform tub/shower stall transfers with assistance level of: Supervision/Verbal cueing

## 2023-04-21 NOTE — Progress Notes (Signed)
PHARMACY - ANTICOAGULATION CONSULT NOTE  Pharmacy Consult for Heparin Indication: RLE DVT with stroke and PFO   Allergies  Allergen Reactions   Amitriptyline Other (See Comments)    Patient reported that it made her throat feel like its locking up and it also caused issues with her going to the bathroom   Duloxetine Other (See Comments)    Patient reported that it made her throat lock up and it caused her to have issue with going to the bathroom   Naproxen     Vomiting, sweating, abd spasms   Other     States can't take pain meds that end in "cet" or meds that end in "ine" Darvocet/severe vomiting   Beef-Derived Drug Products     Patient has IBS prefers no beef   Pork-Derived Products     Patient has IBS prefers no pork   Darvon [Propoxyphene] Nausea And Vomiting   Hydrocodone Nausea And Vomiting   Lactose Intolerance (Gi)     Bloating, gas, abd pain   Latex Itching and Rash   Oxycodone Nausea And Vomiting   Percocet [Oxycodone-Acetaminophen] Nausea And Vomiting   Topamax [Topiramate]     Memory made her emotional     Patient Measurements: Height: 5\' 2"  (157.5 cm) Weight: 69.3 kg (152 lb 12.8 oz) IBW/kg (Calculated) : 50.1 Heparin Dosing Weight: 67 kg  Vital Signs: Temp: 98.2 F (36.8 C) (02/01 1447) BP: 158/71 (02/01 1447) Pulse Rate: 105 (02/01 0841)  Labs: Recent Labs    04/20/23 0704 04/21/23 0605 04/21/23 1626  HGB 12.6 13.3  --   HCT 37.2 39.1  --   PLT 180 181  --   HEPARINUNFRC  --  1.01* 0.71*  CREATININE 1.07* 1.19*  --     Estimated Creatinine Clearance: 47.6 mL/min (A) (by C-G formula based on SCr of 1.19 mg/dL (H)).   Medical History: Past Medical History:  Diagnosis Date   Anemia    Arthritis    trigger finger in left hand   Complication of anesthesia    per pt, hard to wake up!   Elevated cholesterol    Female bladder prolapse    per urologist, does not have prolaspe   GERD (gastroesophageal reflux disease)    Heart murmur    pt  unsure.   Hiatal hernia    Hypertension    IBS (irritable bowel syndrome)    Multiple myeloma (HCC) 2005   had partial chemo   Peripheral neuropathy    SOB (shortness of breath) on exertion    uses an inhaler   Stroke (HCC) 2017   paralysis left arm/uses a walker    Medications:  Medications Prior to Admission  Medication Sig Dispense Refill Last Dose/Taking   amLODipine (NORVASC) 10 MG tablet Take 1 tablet (10 mg total) by mouth daily. (Patient not taking: Reported on 04/12/2023) 30 tablet 0    atorvastatin (LIPITOR) 80 MG tablet Take 1 tablet (80 mg total) by mouth daily. 90 tablet 3    cholecalciferol (CHOLECALCIFEROL) 25 MCG tablet Take 1 tablet (1,000 Units total) by mouth daily.      docusate sodium (COLACE) 100 MG capsule Take 1 capsule (100 mg total) by mouth daily.      ezetimibe (ZETIA) 10 MG tablet Take 1 tablet (10 mg total) by mouth daily.      gabapentin (NEURONTIN) 300 MG capsule Take 1 capsule (300 mg total) by mouth 3 (three) times daily. 90 capsule 3    heparin 47829 UT/250ML  infusion Inject 850 Units/hr into the vein continuous.      hydrALAZINE (APRESOLINE) 50 MG tablet Take 1 tablet (50 mg total) by mouth every 8 (eight) hours. 90 tablet 0    linaclotide (LINZESS) 72 MCG capsule Take 1 capsule (72 mcg total) by mouth daily before breakfast. 90 capsule 1    ticagrelor (BRILINTA) 90 MG TABS tablet Take 1 tablet (90 mg total) by mouth 2 (two) times daily.       Assessment: 58 y.o. F presented with stroke. Now pt found to have RLE DVT. Pt on Lovenox 40mg  daily - last dose 0925. To begin heparin per pharmacy low dose goal. No AC PTA. CBC stable.  Heparin level came back elevated 1.01 this AM. No issue per Rn. We will decrease rate and check another level. Plan for around 48 hrs then NOAC.   PM: HL 0.71 on 700 units/hr. No issues with the infusion or bleeding reported.  Goal of Therapy:  Heparin level 0.3-0.5 units/ml Monitor platelets by anticoagulation protocol:  Yes   Plan:  Decrease heparin gtt to 550 units/hr Will f/u heparin level in 6 hours Daily heparin level and CBC  Loralee Pacas, PharmD, BCPS 04/21/2023 5:11 PM  Please check AMION for all [facility] Pharmacy phone numbers After 10:00 PM, call Main Pharmacy [phone]

## 2023-04-21 NOTE — Progress Notes (Signed)
PROGRESS NOTE   Subjective/Complaints:  Pt didn't sleep well last night, unclear why. Reporting R sided abd pain which is ongoing. Abd xray neg yesterday. Nods that she had a BM today but pt questionable historian-- looks like they're documenting LBM 1/28 on the summary, but none documented in the flowsheets. Urinating ok. Denies CP, SOB, n/v but pt is a questionable historian.   ROS: limited due to aphasia.   Objective:   DG Abd 1 View Result Date: 04/20/2023 CLINICAL DATA:  Right lower quadrant abdominal pain. History of stroke. EXAM: ABDOMEN - 1 VIEW COMPARISON:  04/17/2023 FINDINGS: Gas-filled colon. No large or small bowel distention. No radiopaque stones. Visualized bones and soft tissue contours appear intact. Lung bases are clear. IMPRESSION: Normal nonobstructive bowel gas pattern. Electronically Signed   By: Burman Nieves M.D.   On: 04/20/2023 22:32   VAS [place] LOWER EXTREMITY VENOUS (DVT) Result Date: 04/20/2023  Lower Venous DVT Study Patient Name:  Brittany [name]  Date of Exam:   04/20/2023 Medical Rec #: [id number]              Accession #:    [id number] Date of Birth: 05/31/1965              Patient Gender: F Patient Age:   58 years Exam Location:  [facility] Procedure:      VAS [place] LOWER EXTREMITY VENOUS (DVT) Referring Phys: Marvel Plan --------------------------------------------------------------------------------  Indications: Stroke, Embolic stroke., and Post-op.  Comparison Study: Previous study on 9.21.2018. Performing Technologist: Fernande Bras  Examination Guidelines: A complete evaluation includes B-mode imaging, spectral Doppler, color Doppler, and power Doppler as needed of all accessible portions of each vessel. Bilateral testing is considered an integral part of a complete examination. Limited examinations for reoccurring indications may be performed as noted. The reflux portion of the exam is  performed with the patient in reverse Trendelenburg.  +---------+---------------+---------+-----------+----------+--------------+ RIGHT    CompressibilityPhasicitySpontaneityPropertiesThrombus Aging +---------+---------------+---------+-----------+----------+--------------+ CFV      Partial        Yes      Yes                  Acute          +---------+---------------+---------+-----------+----------+--------------+ SFJ      Partial        Yes      Yes                                 +---------+---------------+---------+-----------+----------+--------------+ FV Prox  Full                                                        +---------+---------------+---------+-----------+----------+--------------+ FV Mid   Full                                                        +---------+---------------+---------+-----------+----------+--------------+  FV DistalFull                                                        +---------+---------------+---------+-----------+----------+--------------+ PFV      Full                                                        +---------+---------------+---------+-----------+----------+--------------+ POP      Full           Yes      Yes                                 +---------+---------------+---------+-----------+----------+--------------+ PTV      Full                                                        +---------+---------------+---------+-----------+----------+--------------+ PERO     Full                                                        +---------+---------------+---------+-----------+----------+--------------+ S/P revascularization of occluded left M1 with right CFA being point of access  +---------+---------------+---------+-----------+----------+--------------+ LEFT     CompressibilityPhasicitySpontaneityPropertiesThrombus Aging  +---------+---------------+---------+-----------+----------+--------------+ CFV      Full           Yes      Yes                                 +---------+---------------+---------+-----------+----------+--------------+ SFJ      Full           Yes      Yes                                 +---------+---------------+---------+-----------+----------+--------------+ FV Prox  Full                                                        +---------+---------------+---------+-----------+----------+--------------+ FV Mid   Full                                                        +---------+---------------+---------+-----------+----------+--------------+ FV DistalFull                                                        +---------+---------------+---------+-----------+----------+--------------+  PFV      Full                                                        +---------+---------------+---------+-----------+----------+--------------+ POP      Full           Yes      Yes                                 +---------+---------------+---------+-----------+----------+--------------+ PTV      Full                                                        +---------+---------------+---------+-----------+----------+--------------+ PERO     Full                                                        +---------+---------------+---------+-----------+----------+--------------+     Summary: RIGHT: - Findings consistent with acute deep vein thrombosis involving the right common femoral vein, and SF junction.  - No cystic structure found in the popliteal fossa.  LEFT: - There is no evidence of deep vein thrombosis in the lower extremity.  - No cystic structure found in the popliteal fossa.  *See table(s) above for measurements and observations.    Preliminary    Recent Labs    04/20/23 0704 04/21/23 0605  WBC 5.0 4.4  HGB 12.6 13.3  HCT 37.2 39.1  PLT 180 181    Recent Labs    04/20/23 0704 04/21/23 0605  NA 139 138  K 3.3* 3.3*  CL 105 101  CO2 22 20*  GLUCOSE 101* 87  BUN 11 16  CREATININE 1.07* 1.19*  CALCIUM 9.0 9.3         Intake/Output Summary (Last 24 hours) at 04/21/2023 1233 Last data filed at 04/21/2023 0520 Gross per 24 hour  Intake 81.45 ml  Output --  Net 81.45 ml        Physical Exam: Vital Signs Blood pressure (!) 143/83, pulse (!) 105, temperature 98.1 F (36.7 C), temperature source Oral, resp. rate 18, height 5\' 2"  (1.575 m), weight 69.3 kg, SpO2 93%.  Constitutional: Mild distress d/t inability to express herself. Appropriate appearance for age. Laying in bed.  Looks otherwise comfortable.  HENT:   Atraumatic, normocephalic. Eyes: PERRLA. EOM seem intact. No notable nystagmus.  Cardiovascular: RRR, no murmurs/rub/gallops. No Edema. Peripheral pulses 2+  Respiratory: CTAB. No rales, rhonchi, or wheezing. On RA.  Abdomen: soft, + bowel sounds, hypoactive. Mild distention; Mild right sided tenderness diffusely, but no focal area of tenderness. .  Skin: C/D/I. No apparent lesions. + IV running heparin - intact.  Neuro: expressive aphasia.    PRIOR EXAMS: MSK:      No apparent deformity. No joint pain on ROM.        Neurologic exam:  Cognition: Awake, alert. Responds to 1/3 verbal commands,  2/3 commands with physical cues.  + Severe  R hemineglect Language: Global aphasia, expressive > receptive. Anomic. Dysarthric.  Insight: Poor insight into current condition.  Mood: Anxious,  but appropriate and interactive.  Sensation: To light touch reduced on RLE Reflexes: Hyporeflexic RLE, 2+ RUE; 1+ LUE and LLE. Negative Hoffman's and babinski signs bilaterally.  CN: + Mild facial droop  Coordination: No apparent tremors.   Spasticity: MAS 1-2 R elbow flexors, 1 elbow extensors, 1 wrist flexors      Strength: 5/5 LUE and LLE;       R side difficult to assess due to neglect.  RUE 1/5 grip, elbow flexion c/b  motor apraxia RLE   3/5 x1 with R hip flexor and knee flexor; no ankle movement.     Assessment/Plan: 1. Functional deficits which require 3+ hours per day of interdisciplinary therapy in a comprehensive inpatient rehab setting. Physiatrist is providing close team supervision and 24 hour management of active medical problems listed below. Physiatrist and rehab team continue to assess barriers to discharge/monitor patient progress toward functional and medical goals  Care Tool:  Bathing    Body parts bathed by patient: Right arm, Left arm, Chest, Abdomen, Front perineal area, Buttocks, Right upper leg, Left upper leg, Right lower leg, Left lower leg, Face         Bathing assist Assist Level: Contact Guard/Touching assist     Upper Body Dressing/Undressing Upper body dressing   What is the patient wearing?: Hospital gown only    Upper body assist Assist Level: Minimal Assistance - Patient > 75%    Lower Body Dressing/Undressing Lower body dressing      What is the patient wearing?: Underwear/pull up, Pants     Lower body assist Assist for lower body dressing: Minimal Assistance - Patient > 75%     Toileting Toileting    Toileting assist Assist for toileting: Contact Guard/Touching assist     Transfers Chair/bed transfer  Transfers assist           Locomotion Ambulation   Ambulation assist              Walk 10 feet activity   Assist           Walk 50 feet activity   Assist           Walk 150 feet activity   Assist           Walk 10 feet on uneven surface  activity   Assist           Wheelchair     Assist               Wheelchair 50 feet with 2 turns activity    Assist            Wheelchair 150 feet activity     Assist          Blood pressure (!) 143/83, pulse (!) 105, temperature 98.1 F (36.7 C), temperature source Oral, resp. rate 18, height 5\' 2"  (1.575 m), weight 69.3 kg, SpO2  93%.  Medical Problem List and Plan: 1. Functional deficits secondary to left MCA infarct due to severe left MCA stenosis; notable Hx prior L cerebellar CVAs             -patient may not shower while on continuous heparin drip             -ELOS/Goals: 10 days, SPV PT/OT, Min A SLP             -04/21/23  CIR evals today; ordered SLP eval since it wasn't on there   2.  R-CFV DVT (found 1/31)/Antithrombotics: -DVT/anticoagulation:  Pharmaceutical: Heparin low dose X 48 hours and if stable transition to DOAC              -antiplatelet therapy: Brillinta. ASA d/c due to addition of Heparin.  - Per lit review, early mobilization current recommendation, without gross restrictions; no convincing evidence of increased PE risk.     3. Pain Management:  Tylenol prn. Gabapentin 300mg  TID 4. Mood/Behavior/Sleep: LCSW to follow for evaluation and support.              -antipsychotic agents: N/A -04/21/23 didn't sleep well, will add melatonin 5mg  PRN, has trazodone PRN as well 5. Neuropsych/cognition: This patient is not capable of making decisions on her own behalf. 6. Skin/Wound Care: Routine pressure relief measures.  7. Fluids/Electrolytes/Nutrition: Monitor I/O. Check routine labs -04/21/23 Cr 1.19 a little up from yesterday but down from prior, monitor; K 3.3, see below; remainder of CMP unremarkable, monitor labs 8. HTN: Monitor BP TID--continue amlodipine 10mg  daily and hydralazine 50mg  q8h             --avoid hypoperfusion.   -04/21/23 BPs ok, monitor Vitals:   04/20/23 1821 04/20/23 1929 04/21/23 0353 04/21/23 0841  BP: (!) 140/81 (!) 141/73 (!) 147/76 (!) 143/83    9. RLQ pain:  Felt to be due to IBS. Has been on miralax daily and Senna S 2 tabs nightly without results --Linzess every day was started today. Will get KUB  -04/21/23 KUB with gas filled colon but otherwise neg/nonobstructive; reviewed images myself, suspect stool burden contributory to gas trapped in R side; will increase  miralax/colace to BID, leave senna S as is, add sorbitol 30ml PRN if no BM today; has PRN dulcolax supp and fleet if needed too; monitor closely 10. Hypokalemia: Supplement added X 2 days.  -04/21/23 K 3.3, potassium supplement not ordered, daily x2 days added today 11. Hyperlipidemia: LDL-124 on Lipitor 80 mg and Zetia 10mg  daily.  12. Pre-diabetes: Hgb A1c- 5.9 and every 4 hours CBS have shown BS controlled. --Will monitor fasting BS on serial labs.  13. Substance abuse: Educated on stroke risk w/marijuana.  14. H/o MM: Acute renal failure improving SCr 1.11 @ admission-->1.59-->1.07  -04/21/23 Cr 1.19, monitor 15. GERD: On protonix 40mg  daily  I spent >87mins performing patient care related activities, including face to face time, documentation time, med management, review of labs/imaging, discussion of meds/plan with patient and staff, and overall coordination of care.     LOS: 1 days A FACE TO FACE EVALUATION WAS PERFORMED  [address] 04/21/2023, 12:33 PM

## 2023-04-22 ENCOUNTER — Inpatient Hospital Stay (HOSPITAL_COMMUNITY): Payer: Medicaid Other

## 2023-04-22 DIAGNOSIS — I724 Aneurysm of artery of lower extremity: Secondary | ICD-10-CM

## 2023-04-22 DIAGNOSIS — I10 Essential (primary) hypertension: Secondary | ICD-10-CM | POA: Diagnosis not present

## 2023-04-22 DIAGNOSIS — I63512 Cerebral infarction due to unspecified occlusion or stenosis of left middle cerebral artery: Secondary | ICD-10-CM | POA: Diagnosis not present

## 2023-04-22 DIAGNOSIS — N179 Acute kidney failure, unspecified: Secondary | ICD-10-CM | POA: Diagnosis not present

## 2023-04-22 DIAGNOSIS — E876 Hypokalemia: Secondary | ICD-10-CM | POA: Diagnosis not present

## 2023-04-22 DIAGNOSIS — K5901 Slow transit constipation: Secondary | ICD-10-CM | POA: Diagnosis not present

## 2023-04-22 DIAGNOSIS — R1031 Right lower quadrant pain: Secondary | ICD-10-CM | POA: Diagnosis not present

## 2023-04-22 DIAGNOSIS — R109 Unspecified abdominal pain: Secondary | ICD-10-CM | POA: Diagnosis not present

## 2023-04-22 LAB — CBC
HCT: 39.7 % (ref 36.0–46.0)
Hemoglobin: 13.2 g/dL (ref 12.0–15.0)
MCH: 28.8 pg (ref 26.0–34.0)
MCHC: 33.2 g/dL (ref 30.0–36.0)
MCV: 86.7 fL (ref 80.0–100.0)
Platelets: 188 10*3/uL (ref 150–400)
RBC: 4.58 MIL/uL (ref 3.87–5.11)
RDW: 14.5 % (ref 11.5–15.5)
WBC: 4.1 10*3/uL (ref 4.0–10.5)
nRBC: 0 % (ref 0.0–0.2)

## 2023-04-22 LAB — HEPARIN LEVEL (UNFRACTIONATED)
Heparin Unfractionated: 0.46 [IU]/mL (ref 0.30–0.70)
Heparin Unfractionated: 0.42 [IU]/mL (ref 0.30–0.70)

## 2023-04-22 MED ORDER — TRAMADOL HCL 50 MG PO TABS
50.0000 mg | ORAL_TABLET | Freq: Once | ORAL | Status: AC
  Administered 2023-04-22: 50 mg via ORAL
  Filled 2023-04-22: qty 1

## 2023-04-22 MED ORDER — TRAMADOL HCL 50 MG PO TABS
100.0000 mg | ORAL_TABLET | Freq: Three times a day (TID) | ORAL | Status: DC | PRN
  Administered 2023-04-22: 100 mg via ORAL
  Filled 2023-04-22: qty 2

## 2023-04-22 MED ORDER — HYDROCODONE-ACETAMINOPHEN 5-325 MG PO TABS
1.0000 | ORAL_TABLET | Freq: Four times a day (QID) | ORAL | Status: DC | PRN
  Administered 2023-04-22: 2 via ORAL
  Filled 2023-04-22: qty 2

## 2023-04-22 NOTE — Progress Notes (Signed)
Occupational Therapy Session Note  Patient Details  Name: Brittany [name] MRN: [id number] Date of Birth: 10-26-1965  {CHL IP REHAB OT TIME CALCULATIONS:304400400}  {CHL IP REHAB OT TIME CALCULATIONS:304400400}  Short Term Goals: Week 1:  OT Short Term Goal 1 (Week 1): STGs=LTGs due to patient's estimated length of stay.  Skilled Therapeutic Interventions/Progress Updates:   Session 1:   Session 2:  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), RLE DVT (no limitations according to H&P) Restrictions Weight Bearing Restrictions Per Provider Order: No   Therapy Group: Individual Therapy  Lou Cal, OTR/L, MSOT  04/22/2023, 3:47 PM

## 2023-04-22 NOTE — Progress Notes (Signed)
New order received from [facility] to give one time Tramadol 50 mg for pain Urelief with Tylenol.

## 2023-04-22 NOTE — Progress Notes (Signed)
VASCULAR LAB    Ultrasound of the right groin to rule out pseudoaneurysm has been performed.  See CV proc for preliminary results.   Ricardo Kayes, RVT 04/22/2023, 5:51 PM

## 2023-04-22 NOTE — Plan of Care (Signed)
  Problem: RH BOWEL ELIMINATION Goal: RH STG MANAGE BOWEL WITH ASSISTANCE Description: STG Manage Bowel with  MOD I Assistance. Outcome: Progressing   Problem: RH BLADDER ELIMINATION Goal: RH STG MANAGE BLADDER WITH ASSISTANCE Description: STG Manage Bladder with Mod I Assistance Outcome: Progressing   Problem: RH SAFETY Goal: RH STG ADHERE TO SAFETY PRECAUTIONS W/ASSISTANCE/DEVICE Description: STG Adhere to Safety Precautions With Assistance/Device. Outcome: Progressing

## 2023-04-22 NOTE — Progress Notes (Signed)
PROGRESS NOTE   Subjective/Complaints:  Pt's brother in today, spends a lot of time assisting with history and expressing concerns. States the area of pain is NOT the abdomen but rather the R groin area, states there are "clots there" that he can feel, thinks the pain is coming from here. Discussed that previously, pt was pointing to her R abdomen and also grimacing when palpated in those areas, but today that seems to be resolved and she's endorsing pain lower, in the R groin. There is associated bruising there, no injury that she can recall. This is the site of her thrombectomy/sheath, and where the DVT is located.  Patient is adamant that she is having daily BMs, reports having one this morning, but none have been reported the entire time she's been in the hospital. Refusing to take sorbitol today because she's adamant that she's had BMs everyday and that her pain is NOT in her abdomen.  States she slept ok, though nursing reported she didn't. Called overnight for pain meds, got tramadol yesterday and helped a little-- can't take hydrocodone or oxycodone due to intolerances/allergies, so discussed that Tramadol is probably our best option PO. Agreeable to trying this.  Urinating fine.  Denies CP, SOB, n/v but pt is a questionable historian. Denies abd pain today.   ROS: limited due to aphasia.   Objective:   VAS [place] LOWER EXTREMITY VENOUS (DVT) Result Date: 04/21/2023  Lower Venous DVT Study Patient Name:  Brittany [name]  Date of Exam:   04/20/2023 Medical Rec #: [id number]              Accession #:    [id number] Date of Birth: November 30, 1965              Patient Gender: F Patient Age:   58 years Exam Location:  [facility] Procedure:      VAS [place] LOWER EXTREMITY VENOUS (DVT) Referring Phys: Marvel Plan --------------------------------------------------------------------------------  Indications: Stroke, Embolic stroke., and Post-op.   Comparison Study: Previous study on 9.21.2018. Performing Technologist: Fernande Bras  Examination Guidelines: A complete evaluation includes B-mode imaging, spectral Doppler, color Doppler, and power Doppler as needed of all accessible portions of each vessel. Bilateral testing is considered an integral part of a complete examination. Limited examinations for reoccurring indications may be performed as noted. The reflux portion of the exam is performed with the patient in reverse Trendelenburg.  +---------+---------------+---------+-----------+----------+--------------+ RIGHT    CompressibilityPhasicitySpontaneityPropertiesThrombus Aging +---------+---------------+---------+-----------+----------+--------------+ CFV      Partial        Yes      Yes                  Acute          +---------+---------------+---------+-----------+----------+--------------+ SFJ      Partial        Yes      Yes                                 +---------+---------------+---------+-----------+----------+--------------+ FV Prox  Full                                                        +---------+---------------+---------+-----------+----------+--------------+  FV Mid   Full                                                        +---------+---------------+---------+-----------+----------+--------------+ FV DistalFull                                                        +---------+---------------+---------+-----------+----------+--------------+ PFV      Full                                                        +---------+---------------+---------+-----------+----------+--------------+ POP      Full           Yes      Yes                                 +---------+---------------+---------+-----------+----------+--------------+ PTV      Full                                                         +---------+---------------+---------+-----------+----------+--------------+ PERO     Full                                                        +---------+---------------+---------+-----------+----------+--------------+ S/P revascularization of occluded left M1 with right CFA being point of access  +---------+---------------+---------+-----------+----------+--------------+ LEFT     CompressibilityPhasicitySpontaneityPropertiesThrombus Aging +---------+---------------+---------+-----------+----------+--------------+ CFV      Full           Yes      Yes                                 +---------+---------------+---------+-----------+----------+--------------+ SFJ      Full           Yes      Yes                                 +---------+---------------+---------+-----------+----------+--------------+ FV Prox  Full                                                        +---------+---------------+---------+-----------+----------+--------------+ FV Mid   Full                                                        +---------+---------------+---------+-----------+----------+--------------+  FV DistalFull                                                        +---------+---------------+---------+-----------+----------+--------------+ PFV      Full                                                        +---------+---------------+---------+-----------+----------+--------------+ POP      Full           Yes      Yes                                 +---------+---------------+---------+-----------+----------+--------------+ PTV      Full                                                        +---------+---------------+---------+-----------+----------+--------------+ PERO     Full                                                        +---------+---------------+---------+-----------+----------+--------------+     Summary: RIGHT: - Findings consistent  with acute deep vein thrombosis involving the right common femoral vein, and SF junction.  - No cystic structure found in the popliteal fossa.  LEFT: - There is no evidence of deep vein thrombosis in the lower extremity.  - No cystic structure found in the popliteal fossa.  *See table(s) above for measurements and observations. Electronically signed by Lemar Livings MD on 04/21/2023 at 4:06:36 PM.    Final    DG Abd 1 View Result Date: 04/20/2023 CLINICAL DATA:  Right lower quadrant abdominal pain. History of stroke. EXAM: ABDOMEN - 1 VIEW COMPARISON:  04/17/2023 FINDINGS: Gas-filled colon. No large or small bowel distention. No radiopaque stones. Visualized bones and soft tissue contours appear intact. Lung bases are clear. IMPRESSION: Normal nonobstructive bowel gas pattern. Electronically Signed   By: Burman Nieves M.D.   On: 04/20/2023 22:32   Recent Labs    04/21/23 0605 04/22/23 0202  WBC 4.4 4.1  HGB 13.3 13.2  HCT 39.1 39.7  PLT 181 188   Recent Labs    04/20/23 0704 04/21/23 0605  NA 139 138  K 3.3* 3.3*  CL 105 101  CO2 22 20*  GLUCOSE 101* 87  BUN 11 16  CREATININE 1.07* 1.19*  CALCIUM 9.0 9.3         Intake/Output Summary (Last 24 hours) at 04/22/2023 1100 Last data filed at 04/22/2023 0806 Gross per 24 hour  Intake 366.03 ml  Output 100 ml  Net 266.03 ml        Physical Exam: Vital Signs Blood pressure (!) 142/89, pulse 90, temperature 98 F (36.7 C), temperature source Oral, resp. rate 16, height 5\' 2"  (1.575 m), weight  67.4 kg, SpO2 98%.  Constitutional: Mild distress d/t inability to express herself, brother at bedside helping with history. Appropriate appearance for age. Laying in bed.  Looks otherwise comfortable in between getting irritated about her pain.   HENT:   Atraumatic, normocephalic. Eyes: PERRLA. EOM seem intact. No notable nystagmus.  Cardiovascular: RRR, no murmurs/rub/gallops. No Edema. Peripheral pulses 2+  Respiratory: CTAB. No rales,  rhonchi, or wheezing. On RA.  Abdomen: soft, + bowel sounds, more normoactive today. Nondistended today; no areas of tenderness to the abdomen today.  Skin: C/D/I. No apparent lesions. + IV running heparin - intact.  Extremities: R groin with large ?hematoma in the inguinal canal area, not indurated but firm, somewhat rubbery, exquisitely tender, no overlying erythema or warmth, scattered bruising to this area and further proximally approaching the anterolateral thigh where there is a larger bruise.  Neuro: expressive aphasia.    PRIOR EXAMS: MSK:      No apparent deformity. No joint pain on ROM.        Neurologic exam:  Cognition: Awake, alert. Responds to 1/3 verbal commands,  2/3 commands with physical cues.  + Severe R hemineglect Language: Global aphasia, expressive > receptive. Anomic. Dysarthric.  Insight: Poor insight into current condition.  Mood: Anxious,  but appropriate and interactive.  Sensation: To light touch reduced on RLE Reflexes: Hyporeflexic RLE, 2+ RUE; 1+ LUE and LLE. Negative Hoffman's and babinski signs bilaterally.  CN: + Mild facial droop  Coordination: No apparent tremors.   Spasticity: MAS 1-2 R elbow flexors, 1 elbow extensors, 1 wrist flexors      Strength: 5/5 LUE and LLE;       R side difficult to assess due to neglect.  RUE 1/5 grip, elbow flexion c/b motor apraxia RLE   3/5 x1 with R hip flexor and knee flexor; no ankle movement.     Assessment/Plan: 1. Functional deficits which require 3+ hours per day of interdisciplinary therapy in a comprehensive inpatient rehab setting. Physiatrist is providing close team supervision and 24 hour management of active medical problems listed below. Physiatrist and rehab team continue to assess barriers to discharge/monitor patient progress toward functional and medical goals  Care Tool:  Bathing    Body parts bathed by patient: Right arm, Left arm, Chest, Abdomen, Front perineal area, Buttocks, Right upper  leg, Left upper leg, Right lower leg, Left lower leg, Face         Bathing assist Assist Level: Contact Guard/Touching assist     Upper Body Dressing/Undressing Upper body dressing   What is the patient wearing?: Hospital gown only    Upper body assist Assist Level: Minimal Assistance - Patient > 75%    Lower Body Dressing/Undressing Lower body dressing      What is the patient wearing?: Underwear/pull up, Pants     Lower body assist Assist for lower body dressing: Minimal Assistance - Patient > 75%     Toileting Toileting    Toileting assist Assist for toileting: Contact Guard/Touching assist     Transfers Chair/bed transfer  Transfers assist     Chair/bed transfer assist level: Minimal Assistance - Patient > 75%     Locomotion Ambulation   Ambulation assist      Assist level: Minimal Assistance - Patient > 75% Assistive device: Hand held assist (HHA or using IV pole) Max distance: 75 ft   Walk 10 feet activity   Assist     Assist level: Contact Guard/Touching assist Assistive device: Other (comment) (  IV pole)   Walk 50 feet activity   Assist    Assist level: Minimal Assistance - Patient > 75% Assistive device: Other (comment) (IV pole)    Walk 150 feet activity   Assist Walk 150 feet activity did not occur: Safety/medical concerns         Walk 10 feet on uneven surface  activity   Assist Walk 10 feet on uneven surfaces activity did not occur: Safety/medical concerns         Wheelchair     Assist Is the patient using a wheelchair?: Yes Type of Wheelchair: Manual    Wheelchair assist level: Dependent - Patient 0% Max wheelchair distance: 250    Wheelchair 50 feet with 2 turns activity    Assist        Assist Level: Dependent - Patient 0%   Wheelchair 150 feet activity     Assist      Assist Level: Dependent - Patient 0%   Blood pressure (!) 142/89, pulse 90, temperature 98 F (36.7 C),  temperature source Oral, resp. rate 16, height 5\' 2"  (1.575 m), weight 67.4 kg, SpO2 98%.  Medical Problem List and Plan: 1. Functional deficits secondary to left MCA infarct due to severe left MCA stenosis; notable Hx prior L cerebellar CVAs             -patient may not shower while on continuous heparin drip             -ELOS/Goals: 10 days, SPV PT/OT, Min A SLP             -04/21/23 CIR evals today; ordered SLP eval since it wasn't on there  -Continue CIR   2.  R-CFV DVT (found 1/31)/Antithrombotics: -DVT/anticoagulation:  Pharmaceutical: Heparin low dose X 48 hours and if stable transition to DOAC              -antiplatelet therapy: Brillinta. ASA d/c due to addition of Heparin.  - Per lit review, early mobilization current recommendation, without gross restrictions; no convincing evidence of increased PE risk.   -04/22/23 long discussion with pt and brother regarding clot location, this coincides with area of pain, large palpable ?hematoma to R groin; had mechanical thrombectomy-- called IR Dr. Richarda Overlie who relayed to the team, Anders Grant NP will come see the patient, appreciate their assistance with this case. For now, added Tramadol 100mg  q8h PRN to help with pain.    3. Pain Management:  Tylenol prn. Gabapentin 300mg  TID -04/22/23 added Tramadol 100mg  q8h PRN for pain in R groin (see above)-- pt allergic/intolerant to other hydro/oxycodone.  4. Mood/Behavior/Sleep: LCSW to follow for evaluation and support.              -antipsychotic agents: N/A -04/21/23 didn't sleep well, will add melatonin 5mg  PRN, has trazodone PRN as well 5. Neuropsych/cognition: This patient is not capable of making decisions on her own behalf. 6. Skin/Wound Care: Routine pressure relief measures.  7. Fluids/Electrolytes/Nutrition: Monitor I/O. Check routine labs -04/21/23 Cr 1.19 a little up from yesterday but down from prior, monitor; K 3.3, see below; remainder of CMP unremarkable, monitor labs 8. HTN:  Monitor BP TID--continue amlodipine 10mg  daily and hydralazine 50mg  q8h             --avoid hypoperfusion.   -2/1-2/25 BPs ok, monitor Vitals:   04/20/23 1821 04/20/23 1929 04/21/23 0353 04/21/23 0841  BP: (!) 140/81 (!) 141/73 (!) 147/76 (!) 143/83   04/21/23 1447 04/21/23 1938 04/21/23  2044 04/22/23 0407  BP: (!) 158/71 (!) 151/69 (!) 151/69 125/67   04/22/23 0623 04/22/23 1010  BP: 125/67 (!) 142/89    9. RLQ pain:  Felt to be due to IBS. Has been on miralax daily and Senna S 2 tabs nightly without results --Linzess every day was started today. Will get KUB  -04/21/23 KUB with gas filled colon but otherwise neg/nonobstructive; reviewed images myself, suspect stool burden contributory to gas trapped in R side; will increase miralax/colace to BID, leave senna S as is, add sorbitol 30ml PRN if no BM today; has PRN dulcolax supp and fleet if needed too; monitor closely -04/22/23 pt insists she's had BMs daily, but no documentation; no abd pain today, appears it's R groin pain-- see above #2-- tramadol added, leave stool softeners in place, NEED DOCUMENTATION OF STOOLS 10. Hypokalemia: Supplement added X 2 days.  -04/21/23 K 3.3, potassium supplement not ordered, daily x2 days added today 11. Hyperlipidemia: LDL-124 on Lipitor 80 mg and Zetia 10mg  daily.  12. Pre-diabetes: Hgb A1c- 5.9 and every 4 hours CBS have shown BS controlled. --Will monitor fasting BS on serial labs.  13. Substance abuse: Educated on stroke risk w/marijuana.  14. H/o MM: Acute renal failure improving SCr 1.11 @ admission-->1.59-->1.07  -04/21/23 Cr 1.19, monitor 15. GERD: On protonix 40mg  daily  I spent >38mins performing patient care related activities, including prolonged face to face time, documentation time, med management, review of labs/imaging, discussion of meds/plan with patient, brother, IR team, and staff, and overall coordination of care.     LOS: 2 days A FACE TO FACE EVALUATION WAS  PERFORMED  [address] Denesia Donelan 04/22/2023, 11:00 AM

## 2023-04-22 NOTE — Progress Notes (Signed)
Will continue on IV heparin until further evaluation of right groin mass. Would make decision regarding switch to Eliquis tomorrow after IR evaluation.

## 2023-04-22 NOTE — Progress Notes (Signed)
PHARMACY - ANTICOAGULATION CONSULT NOTE  Pharmacy Consult for heparin Indication:  DVT in setting of stroke  Labs: Recent Labs    04/20/23 0704 04/21/23 0605 04/21/23 1626 04/22/23 0202  HGB 12.6 13.3  --  13.2  HCT 37.2 39.1  --  39.7  PLT 180 181  --  188  HEPARINUNFRC  --  1.01* 0.71* 0.46  CREATININE 1.07* 1.19*  --   --    Assessment/Plan:  57yo female therapeutic on heparin aftr rate change. Will continue infusion at current rate of 550 units/hr and confirm stable with additional level.  Vernard Gambles, PharmD, BCPS 04/22/2023 2:53 AM

## 2023-04-22 NOTE — Progress Notes (Signed)
58 y.o. female inpatient. Presented with  Worsening aphasia, right-sided weakness and right-sided neglect. Found to have a left middle cerebral artery occlusion s/p  revascularization of occluded left middle cerebral M1 segment, with 1 pass with a 4 mm x 40 mm Solitaire X retrieval device, and balloon angioplasty of the occluded left middle cerebral M1 segment secondary to intracranial arteriosclerosis. Stenting of the left middle cerebral artery M1 segment using a 2 mm x 12 mm anterior balloon mounted stent with restoration of TICI 2C revascularization with Dr. Julieanne Cotton. Hospital stay complicated by right  common femoral vein, and SF junction. DVT (2.1.25). Started on a heparin gtt. Patient endorses tenderness to right groin.   Patient seen at bedside. Endorses tenderness to the medial aspect of her right inner thigh. Mild bruising noted. Case discussed with NIR Dr. Julieanne Cotton. Venous Doppler order to further evaluate pseudoaneurysm.   PLAN: Vascular US ordered for further evaluation of  pseudoaneurysm

## 2023-04-22 NOTE — Discharge Instructions (Signed)
Inpatient Rehab Discharge Instructions  Brittany [name] Discharge date and time: 04/20/2023  5:50 PM   Activities/Precautions/ Functional Status: Activity: no lifting, driving, or strenuous exercise for till cleared by MD Diet: cardiac diet Wound Care: keep wound clean and dry   Functional status:  ___ No restrictions     ___ Walk up steps independently ___ 24/7 supervision/assistance   ___ Walk up steps with assistance ___ Intermittent supervision/assistance  ___ Bathe/dress independently ___ Walk with walker     ___ Bathe/dress with assistance ___ Walk Independently    ___ Shower independently ___ Walk with assistance    ___ Shower with assistance ___ No alcohol     ___ Return to work/school ________  Special Instructions:    My questions have been answered and I understand these instructions. I will adhere to these goals and the provided educational materials after my discharge from the hospital.  Patient/Caregiver Signature _______________________________ Date __________  Clinician Signature _______________________________________ Date __________  Please bring this form and your medication list with you to all your follow-up doctor's appointments.

## 2023-04-22 NOTE — Progress Notes (Signed)
PHARMACY - ANTICOAGULATION CONSULT NOTE  Pharmacy Consult for Heparin Indication: RLE DVT with stroke and PFO   Allergies  Allergen Reactions   Amitriptyline Other (See Comments)    Patient reported that it made her throat feel like its locking up and it also caused issues with her going to the bathroom   Duloxetine Other (See Comments)    Patient reported that it made her throat lock up and it caused her to have issue with going to the bathroom   Naproxen     Vomiting, sweating, abd spasms   Other     States can't take pain meds that end in "cet" or meds that end in "ine" Darvocet/severe vomiting   Beef-Derived Drug Products     Patient has IBS prefers no beef   Pork-Derived Products     Patient has IBS prefers no pork   Darvon [Propoxyphene] Nausea And Vomiting   Hydrocodone Nausea And Vomiting   Lactose Intolerance (Gi)     Bloating, gas, abd pain   Latex Itching and Rash   Oxycodone Nausea And Vomiting   Percocet [Oxycodone-Acetaminophen] Nausea And Vomiting   Topamax [Topiramate]     Memory made her emotional     Patient Measurements: Height: 5\' 2"  (157.5 cm) Weight: 67.4 kg (148 lb 9.4 oz) IBW/kg (Calculated) : 50.1 Heparin Dosing Weight: 67 kg  Vital Signs: Temp: 98 F (36.7 C) (02/02 1010) Temp Source: Oral (02/02 1010) BP: 142/89 (02/02 1010) Pulse Rate: 90 (02/02 1010)  Labs: Recent Labs    04/20/23 0704 04/20/23 0704 04/21/23 0605 04/21/23 1626 04/22/23 0202 04/22/23 1035  HGB 12.6  --  13.3  --  13.2  --   HCT 37.2  --  39.1  --  39.7  --   PLT 180  --  181  --  188  --   HEPARINUNFRC  --    < > 1.01* 0.71* 0.46 0.42  CREATININE 1.07*  --  1.19*  --   --   --    < > = values in this interval not displayed.    Estimated Creatinine Clearance: 46.9 mL/min (A) (by C-G formula based on SCr of 1.19 mg/dL (H)).   Medical History: Past Medical History:  Diagnosis Date   Anemia    Arthritis    trigger finger in left hand   Complication of  anesthesia    per pt, hard to wake up!   Elevated cholesterol    Female bladder prolapse    per urologist, does not have prolaspe   GERD (gastroesophageal reflux disease)    Heart murmur    pt unsure.   Hiatal hernia    Hypertension    IBS (irritable bowel syndrome)    Multiple myeloma (HCC) 2005   had partial chemo   Peripheral neuropathy    SOB (shortness of breath) on exertion    uses an inhaler   Stroke (HCC) 2017   paralysis left arm/uses a walker    Medications:  Medications Prior to Admission  Medication Sig Dispense Refill Last Dose/Taking   amLODipine (NORVASC) 10 MG tablet Take 1 tablet (10 mg total) by mouth daily. (Patient not taking: Reported on 04/12/2023) 30 tablet 0    atorvastatin (LIPITOR) 80 MG tablet Take 1 tablet (80 mg total) by mouth daily. 90 tablet 3    cholecalciferol (CHOLECALCIFEROL) 25 MCG tablet Take 1 tablet (1,000 Units total) by mouth daily.      docusate sodium (COLACE) 100 MG capsule Take  1 capsule (100 mg total) by mouth daily.      ezetimibe (ZETIA) 10 MG tablet Take 1 tablet (10 mg total) by mouth daily.      gabapentin (NEURONTIN) 300 MG capsule Take 1 capsule (300 mg total) by mouth 3 (three) times daily. 90 capsule 3    heparin 28413 UT/250ML infusion Inject 850 Units/hr into the vein continuous.      hydrALAZINE (APRESOLINE) 50 MG tablet Take 1 tablet (50 mg total) by mouth every 8 (eight) hours. 90 tablet 0    linaclotide (LINZESS) 72 MCG capsule Take 1 capsule (72 mcg total) by mouth daily before breakfast. 90 capsule 1    ticagrelor (BRILINTA) 90 MG TABS tablet Take 1 tablet (90 mg total) by mouth 2 (two) times daily.       Assessment: 58 y.o. F presented with stroke. Now pt found to have RLE DVT. Pt on Lovenox 40mg  daily - last dose 0925. To begin heparin per pharmacy low dose goal. No AC PTA. CBC stable.  Heparin level came back elevated 1.01 this AM. No issue per Rn. We will decrease rate and check another level. Plan for around 48  hrs then NOAC.   Heparin level is in goal. Pt complains of pain in groin area. IR consulted to eval for hematoma. CBC stable.   Goal of Therapy:  Heparin level 0.3-0.5 units/ml Monitor platelets by anticoagulation protocol: Yes   Plan:  Cont heparin gtt 550 units/hr Daily heparin level and CBC  Ulyses Southward, PharmD, BCIDP, AAHIVP, CPP Infectious Disease Pharmacist 04/22/2023 12:39 PM

## 2023-04-22 NOTE — Progress Notes (Signed)
Pt grimacing/moaning all night due to right side abdominal pain. One time Tramadol 50 mg given and pain was relief for few hours.

## 2023-04-22 NOTE — Progress Notes (Addendum)
Spoke to patient's mother who indicates patients concerned about having the vascular ultrasound test done. It was supposed to be done earlier today. Have switched the order to stat radiology has been contacted. Patient has a history of opiate misuse and families concerned about using pain medication, however in the short term, we discussed that this would be helpful.  US done , no pseudoaneurysm and a small hematoma.  The right common femoral DVT appears smaller.  Spoke with Dr Corliss Skains who recommended pt avoid hip flexion for comfort. Official read should be tomorrow .  No need for procedure at this time.  Pt needs anti coag for DVT treatment, can rescan in 1-2 d if pain worsens to see if hematoma is enlarging.  Cont Heparin for now.

## 2023-04-23 DIAGNOSIS — I82411 Acute embolism and thrombosis of right femoral vein: Secondary | ICD-10-CM | POA: Diagnosis not present

## 2023-04-23 DIAGNOSIS — I10 Essential (primary) hypertension: Secondary | ICD-10-CM | POA: Diagnosis not present

## 2023-04-23 DIAGNOSIS — I63512 Cerebral infarction due to unspecified occlusion or stenosis of left middle cerebral artery: Secondary | ICD-10-CM | POA: Diagnosis not present

## 2023-04-23 DIAGNOSIS — N179 Acute kidney failure, unspecified: Secondary | ICD-10-CM | POA: Diagnosis not present

## 2023-04-23 DIAGNOSIS — E876 Hypokalemia: Secondary | ICD-10-CM | POA: Diagnosis not present

## 2023-04-23 LAB — BASIC METABOLIC PANEL
Anion gap: 11 (ref 5–15)
BUN: 17 mg/dL (ref 6–20)
CO2: 22 mmol/L (ref 22–32)
Calcium: 8.9 mg/dL (ref 8.9–10.3)
Chloride: 102 mmol/L (ref 98–111)
Creatinine, Ser: 0.97 mg/dL (ref 0.44–1.00)
GFR, Estimated: >60 mL/min (ref >60)
Glucose, Bld: 84 mg/dL (ref 70–99)
Potassium: 4.6 mmol/L (ref 3.5–5.1)
Sodium: 135 mmol/L (ref 135–145)

## 2023-04-23 LAB — CBC
HCT: 39.7 % (ref 36.0–46.0)
Hemoglobin: 13.4 g/dL (ref 12.0–15.0)
MCH: 29 pg (ref 26.0–34.0)
MCHC: 33.8 g/dL (ref 30.0–36.0)
MCV: 85.9 fL (ref 80.0–100.0)
Platelets: 200 10*3/uL (ref 150–400)
RBC: 4.62 MIL/uL (ref 3.87–5.11)
RDW: 14.6 % (ref 11.5–15.5)
WBC: 4.6 10*3/uL (ref 4.0–10.5)
nRBC: 0 % (ref 0.0–0.2)

## 2023-04-23 LAB — BLOOD GAS, ARTERIAL
Acid-Base Excess: 0.4 mmol/L (ref 0.0–2.0)
Bicarbonate: 23.3 mmol/L (ref 20.0–28.0)
Drawn by: 36277
O2 Saturation: 97.5 %
Patient temperature: 37.6
pCO2 arterial: 33 mm[Hg] (ref 32–48)
pH, Arterial: 7.46 — ABNORMAL HIGH (ref 7.35–7.45)
pO2, Arterial: 82 mm[Hg] — ABNORMAL LOW (ref 83–108)

## 2023-04-23 LAB — HEPARIN LEVEL (UNFRACTIONATED): Heparin Unfractionated: 0.42 [IU]/mL (ref 0.30–0.70)

## 2023-04-23 LAB — GLUCOSE, CAPILLARY: Glucose-Capillary: 127 mg/dL — ABNORMAL HIGH (ref 70–99)

## 2023-04-23 MED ORDER — APIXABAN 5 MG PO TABS: 5.0000 mg | ORAL_TABLET | Freq: Two times a day (BID) | ORAL | Status: DC

## 2023-04-23 MED ORDER — APIXABAN 5 MG PO TABS
10.0000 mg | ORAL_TABLET | Freq: Two times a day (BID) | ORAL | Status: DC
  Administered 2023-04-23 – 2023-04-24 (×3): 10 mg via ORAL
  Filled 2023-04-23 (×3): qty 2

## 2023-04-23 NOTE — IPOC Note (Signed)
Overall Plan of Care [facility]) Patient Details Name: Brittany [name] MRN: [id number] DOB: Feb 11, 1966  Admitting Diagnosis: Acute ischemic left middle cerebral artery (MCA) stroke [facility])  Hospital Problems: Principal Problem:   Acute ischemic left middle cerebral artery (MCA) stroke (HCC) Active Problems:   Acute deep vein thrombosis (DVT) of femoral vein of right lower extremity (HCC)     Functional Problem List: Nursing Bladder, Bowel, Endurance, Medication Management, Nutrition, Pain, Safety  PT Balance, Endurance, Motor, Perception, Sensory  OT Balance, Cognition, Behavior, Endurance, Motor, Pain, Perception, Safety, Sensory  SLP Linguistic, Safety  TR         Basic ADL's: OT Bathing, Dressing, Toileting     Advanced  ADL's: OT       Transfers: PT Bed Mobility, Bed to Chair, Car, Occupational psychologist, Research scientist (life sciences): PT Ambulation, Stairs     Additional Impairments: OT Fuctional Use of Upper Extremity  SLP Communication comprehension, expression    TR      Anticipated Outcomes Item Anticipated Outcome  Self Feeding    Swallowing      Basic self-care  Supervision  Toileting  Supervision   Bathroom Transfers Supervision  Bowel/Bladder  manage bowels with medications/ manage bladder with time toileting  Transfers  IND  Locomotion  mod I/ supervision  Communication  max assist  Cognition     Pain  <4w/ prns  Safety/Judgment  manage safety with Mod I assist   Therapy Plan: PT Intensity: Minimum of 1-2 x/day ,45 to 90 minutes PT Frequency: 5 out of 7 days PT Duration Estimated Length of Stay: 10-12 days OT Intensity: Minimum of 1-2 x/day, 45 to 90 minutes OT Frequency: 5 out of 7 days OT Duration/Estimated Length of Stay: 7-10 days SLP Intensity: Minumum of 1-2 x/day, 30 to 90 minutes SLP Frequency: 3 to 5 out of 7 days SLP Duration/Estimated Length of Stay: 10-12 days   Team Interventions: Nursing Interventions Patient/Family  Education, Disease Management/Prevention, Discharge Planning, Bladder Management, Bowel Management, Medication Management, Dysphagia/Aspiration Precaution Training, Pain Management  PT interventions Ambulation/gait training, Cognitive remediation/compensation, Discharge planning, DME/adaptive equipment instruction, Functional mobility training, Pain management, Psychosocial support, Splinting/orthotics, Therapeutic Activities, UE/LE Strength taining/ROM, Visual/perceptual remediation/compensation, UE/LE Coordination activities, Therapeutic Exercise, Stair training, Skin care/wound management, Patient/family education, Neuromuscular re-education, Disease management/prevention, Firefighter, Warden/ranger  OT Interventions Warden/ranger, Cognitive remediation/compensation, Neuromuscular re-education, Community reintegration, Pain management, Disease mangement/prevention, Discharge planning, DME/adaptive equipment instruction, Psychosocial support, Patient/family education, Therapeutic Exercise, UE/LE Strength taining/ROM, Therapeutic Activities, Splinting/orthotics, Wheelchair propulsion/positioning, Visual/perceptual remediation/compensation, Skin care/wound managment, Functional mobility training, Functional electrical stimulation, Self Care/advanced ADL retraining, UE/LE Coordination activities  SLP Interventions Multimodal communication approach, Speech/Language facilitation, Environmental controls, Cueing hierarchy, Functional tasks, Therapeutic Activities, Internal/external aids, Patient/family education  TR Interventions    SW/CM Interventions Discharge Planning, Psychosocial Support, Patient/Family Education   Barriers to Discharge MD  Medical stability and Nutritional means  Nursing Home environment access/layout, Decreased caregiver support Home Layout: One level ;  Home Access: Level entry  PT Insurance for SNF coverage, Medication compliance, Other  (comments) (aphasia)    OT Decreased caregiver support, Lack of/limited family support, Other (comments) (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))    SLP      SW Insurance for SNF coverage, Decreased caregiver support, Lack of/limited family support     Team Discharge Planning: Destination: PT-Home ,OT- Home , SLP-Home Projected Follow-up: PT-Home health PT, 24 hour supervision/assistance, Outpatient PT, OT-  Outpatient OT, SLP-Outpatient SLP, 24 hour supervision/assistance Projected Equipment Needs:  PT-To be determined, OT- To be determined, SLP-None recommended by SLP Equipment Details: PT- , OT-  Patient/family involved in discharge planning: PT- Patient, Family member/caregiver,  OT-Patient, SLP-Patient  MD ELOS: 10-12 Medical Rehab Prognosis:  Good Assessment: The patient has been admitted for CIR therapies with the diagnosis of left MCA infarct due to severe left MCA stenosis; notable Hx prior L . The team will be addressing functional mobility, strength, stamina, balance, safety, adaptive techniques and equipment, self-care, bowel and bladder mgt, patient and caregiver education. Goals have been set at sup, max A with communication. Anticipated discharge destination is home.         See Team Conference Notes for weekly updates to the plan of care

## 2023-04-23 NOTE — Progress Notes (Signed)
 Inpatient Rehabilitation  Patient information reviewed and entered into eRehab system by Cheri Rous, OTR/L, Rehab Quality Coordinator.   Information including medical coding, functional ability and quality indicators will be reviewed and updated through discharge.

## 2023-04-23 NOTE — Significant Event (Signed)
Rapid Response Event Note   Reason for Call :  lethargic  Initial Focused Assessment:  Patient responds to painful stimuli, moans/crys out and opens eyes. Will follow some commands. RLE appears weaker, pt with deficits from stroke this admission. Pupils 3 reactive. Skin warm, dry. Lungs clear and heart tones normal. Patient has not received any sedating medication overnight. Right groin pain limited therapy participation over the weekend, hematoma present (not new).   162/81 (105) HR 109 RR 17 O2 100% RA Temp 99.6 rectal   Interventions/Plan of Care:  Blankets removed ABG  MD updated at bedside  Event Summary:  MD Notified: Y. Shtridelman MD Call Time: [phone]  Arrival Time: 4010 End Time: 0915  Truddie Crumble, RN

## 2023-04-23 NOTE — Progress Notes (Addendum)
Patient responding and alert.   Tele-monitoring order Tele monitor not available, patient on wait list for tele-sitter, MD aware   [name] Dome, LPN

## 2023-04-23 NOTE — Progress Notes (Signed)
Inpatient Rehabilitation Center Individual Statement of Services  Patient Name:  Brittany [name]  Date:  04/23/2023  Welcome to the Inpatient Rehabilitation Center.  Our goal is to provide you with an individualized program based on your diagnosis and situation, designed to meet your specific needs.  With this comprehensive rehabilitation program, you will be expected to participate in at least 3 hours of rehabilitation therapies Monday-Friday, with modified therapy programming on the weekends.  Your rehabilitation program will include the following services:  Physical Therapy (PT), Occupational Therapy (OT), Speech Therapy (ST), 24 hour per day rehabilitation nursing, Therapeutic Recreaction (TR), Neuropsychology, Care Coordinator, Rehabilitation Medicine, Nutrition Services, and Pharmacy Services  Weekly team conferences will be held on Wednesday to discuss your progress.  Your Inpatient Rehabilitation Care Coordinator will talk with you frequently to get your input and to update you on team discussions.  Team conferences with you and your family in attendance may also be held.  Expected length of stay: 10-12 days  Overall anticipated outcome: supervision with cues  Depending on your progress and recovery, your program may change. Your Inpatient Rehabilitation Care Coordinator will coordinate services and will keep you informed of any changes. Your Inpatient Rehabilitation Care Coordinator's name and contact numbers are listed  below.  The following services may also be recommended but are not provided by the Inpatient Rehabilitation Center:   Home Health Rehabiltiation Services Outpatient Rehabilitation Services    Arrangements will be made to provide these services after discharge if needed.  Arrangements include referral to agencies that provide these services.  Your insurance has been verified to be:  [facility] Medicaid Your primary doctor is:  Hyman Hopes  Pertinent information will  be shared with your doctor and your insurance company.  Inpatient Rehabilitation Care Coordinator:  Dossie Der, [place] [phone] or Luna Glasgow  Information discussed with and copy given to patient by: Lucy Chris, 04/23/2023, 11:07 AM

## 2023-04-23 NOTE — Plan of Care (Signed)
  Problem: RH Comprehension Communication Goal: LTG Patient will comprehend basic/complex auditory (SLP) Description: LTG: Patient will comprehend basic/complex auditory information with cues (SLP). Flowsheets (Taken 04/23/2023 1253) LTG: Patient will comprehend: Basic auditory information LTG: Patient will comprehend auditory information with cueing (SLP): Maximal Assistance - Patient 25 - 49%   Problem: RH Expression Communication Goal: LTG Patient will express needs/wants via multi-modal(SLP) Description: LTG:  Patient will express needs/wants via multi-modal communication (gestures/written, etc) with cues (SLP) Flowsheets (Taken 04/23/2023 1253) LTG: Patient will express needs/wants via multimodal communication (gestures/written, etc) with cueing (SLP): Maximal Assistance - Patient 25 - 49% Goal: LTG Patient will verbally express basic/complex needs(SLP) Description: LTG:  Patient will verbally express basic/complex needs, wants or ideas with cues  (SLP) Flowsheets (Taken 04/23/2023 1253) LTG: Patient will verbally express basic/complex needs, wants or ideas (SLP): Maximal Assistance - Patient 25 - 49%

## 2023-04-23 NOTE — Progress Notes (Addendum)
Patient lethargic, only responding to pain, vital signs stable. Contact rapid response nurse for assistance. MD notified, PA Notified, New orders placed.  [name] Dome, LPN

## 2023-04-23 NOTE — Progress Notes (Signed)
Met with patient with mom and dad in room to review current situation, team conferences and plan of care. Parents were very anxious about patients current status. Dan PA came to room, vital signs was taken and WNL. Patient woke up with tactile stimulation and was more alert. Medications for pain and sleep were adjusted. Patient is continent of urine. Mom is asking for diet modification. Claims patient is vegetarian and updated. Suction was set-up in room patient gesturing like some phlegm stuck in her throat. Mouth care done. Reviewed medications with mother, dietary recommendations of secondary risks factors including HTN/HLD. Discussed 3 weeks of DAPT then ASA alone. While in room PA of Dr. Corliss Skains came in to check right groin hematoma and marked. She will asked MD of lower extremity restrictions for today. Team was updated. Continue to follow along to discharge to provide educational needs to facilitate preparation for discharge.

## 2023-04-23 NOTE — Progress Notes (Addendum)
PHARMACY - ANTICOAGULATION CONSULT NOTE  Pharmacy Consult for Heparin>. apixaban Indication: RLE DVT with stroke and PFO   Allergies  Allergen Reactions   Amitriptyline Other (See Comments)    Patient reported that it made her throat feel like its locking up and it also caused issues with her going to the bathroom   Duloxetine Other (See Comments)    Patient reported that it made her throat lock up and it caused her to have issue with going to the bathroom   Naproxen     Vomiting, sweating, abd spasms   Other     States can't take pain meds that end in "cet" or meds that end in "ine" Darvocet/severe vomiting   Beef-Derived Drug Products     Patient has IBS prefers no beef   Pork-Derived Products     Patient has IBS prefers no pork   Darvon [Propoxyphene] Nausea And Vomiting   Hydrocodone Nausea And Vomiting   Lactose Intolerance (Gi)     Bloating, gas, abd pain   Latex Itching and Rash   Oxycodone Nausea And Vomiting   Percocet [Oxycodone-Acetaminophen] Nausea And Vomiting   Topamax [Topiramate]     Memory made her emotional     Patient Measurements: Height: 5\' 2"  (157.5 cm) Weight: 67.4 kg (148 lb 9.4 oz) IBW/kg (Calculated) : 50.1 Heparin Dosing Weight: 67 kg  Vital Signs: Temp: 98.4 F (36.9 C) (02/03 0839) Temp Source: Oral (02/03 0839) BP: 145/80 (02/03 0931) Pulse Rate: 110 (02/03 0931)  Labs: Recent Labs    04/21/23 0605 04/21/23 1626 04/22/23 0202 04/22/23 1035 04/23/23 0555  HGB 13.3  --  13.2  --  13.4  HCT 39.1  --  39.7  --  39.7  PLT 181  --  188  --  200  HEPARINUNFRC 1.01*   < > 0.46 0.42 0.42  CREATININE 1.19*  --   --   --  0.97   < > = values in this interval not displayed.    Estimated Creatinine Clearance: 57.6 mL/min (by C-G formula based on SCr of 0.97 mg/dL).   Medical History: Past Medical History:  Diagnosis Date   Anemia    Arthritis    trigger finger in left hand   Complication of anesthesia    per pt, hard to wake up!    Elevated cholesterol    Female bladder prolapse    per urologist, does not have prolaspe   GERD (gastroesophageal reflux disease)    Heart murmur    pt unsure.   Hiatal hernia    Hypertension    IBS (irritable bowel syndrome)    Multiple myeloma (HCC) 2005   had partial chemo   Peripheral neuropathy    SOB (shortness of breath) on exertion    uses an inhaler   Stroke (HCC) 2017   paralysis left arm/uses a walker        Assessment: 58 y.o. F presented with stroke. Now pt found to have RLE DVT 1/31. Pt on Lovenox 40mg  daily - last dose 0925. No anticoagulation prior to admission. Pharmacy consulted for heparin.    Heparin level 0.42 is therapeutic on 550units/hr. CBC stable   Goal of Therapy:  Heparin level 0.3-0.5 units/ml Monitor platelets by anticoagulation protocol: Yes   Plan:  Cont heparin gtt 550 units/hr Monitor daily heparin level, CBC, signs/symptoms of bleeding   ADDENDUM 13:00- consulted to switch to apixaban. Has been therapeutic on heparin since 2/1. Stop heparin. Start apixaban 10mg  BID for  5 days then decrease to 5mg  BID. Monitor for bleeding.    Alphia Moh, PharmD, BCPS, BCCP Clinical Pharmacist  Please check AMION for all [facility] Pharmacy phone numbers After 10:00 PM, call Main Pharmacy [phone]

## 2023-04-23 NOTE — Progress Notes (Signed)
Inpatient Rehabilitation Care Coordinator Assessment and Plan Patient Details  Name: Brittany [name] MRN: [id number] Date of Birth: Mar 11, 1966  Today's Date: 04/23/2023  Hospital Problems: Principal Problem:   Acute ischemic left middle cerebral artery (MCA) stroke [facility])  Past Medical History:  Past Medical History:  Diagnosis Date   Anemia    Arthritis    trigger finger in left hand   Complication of anesthesia    per pt, hard to wake up!   Elevated cholesterol    Female bladder prolapse    per urologist, does not have prolaspe   GERD (gastroesophageal reflux disease)    Heart murmur    pt unsure.   Hiatal hernia    Hypertension    IBS (irritable bowel syndrome)    Multiple myeloma (HCC) 2005   had partial chemo   Peripheral neuropathy    SOB (shortness of breath) on exertion    uses an inhaler   Stroke (HCC) 2017   paralysis left arm/uses a walker   Past Surgical History:  Past Surgical History:  Procedure Laterality Date   BONE BIOPSY  2005   in her back   COLONOSCOPY     over 10 years x3   ESOPHAGOGASTRODUODENOSCOPY     incomplete-over 10 years ago    IR CT HEAD LTD  04/16/2023   IR CT HEAD LTD  04/16/2023   IR INTRA CRAN STENT  04/16/2023   IR PERCUTANEOUS ART THROMBECTOMY/INFUSION INTRACRANIAL INC DIAG ANGIO  04/16/2023   RADIOLOGY WITH ANESTHESIA N/A 04/16/2023   Procedure: IR WITH ANESTHESIA;  Surgeon: Julieanne Cotton, MD;  Location: [facility] OR;  Service: Radiology;  Laterality: N/A;   TEE WITHOUT CARDIOVERSION N/A 12/08/2016   Procedure: TRANSESOPHAGEAL ECHOCARDIOGRAM (TEE);  Surgeon: Quintella Reichert, MD;  Location: [facility] ENDOSCOPY;  Service: Cardiovascular;  Laterality: N/A;   TUBAL LIGATION     UPPER GASTROINTESTINAL ENDOSCOPY     WISDOM TOOTH EXTRACTION     Social History:  reports that she quit smoking about 7 years ago. Her smoking use included cigarettes. She has never used smokeless tobacco. She reports that she does not currently use alcohol. She  reports that she does not currently use drugs after having used the following drugs: Marijuana.  Family / Support Systems Marital Status: Separated Patient Roles: Parent, Other (Comment) (Daughter) Children: Bradley Ferris in [facility] [phone] has another son in [place] Other Supports: Edward-Dad [phone]  Josphine-mom [phone] Anticipated Caregiver: Posey Rea at this time depends upoin her care needs. Parents did not confirm will provide care at [place] son in [facility] pt has no plans to go there Ability/Limitations of Caregiver: Parents are elderly and use assistive device although move around the pt's room fine. Son is in [facility] and works Engineer, structural Availability: Other (Comment) (Unsure of plan at this time-parents want her to return to her apartment) Family Dynamics: Close with parents and two son's. Son's live out of state and work. She has a few neighbors who will check and a Scientist, product/process development who assists with home management and transport her to the grocery store  Social History Preferred language: English Religion: None Cultural Background: No issues Education: HS Health Literacy - How often do you need to have someone help you when you read instructions, pamphlets, or other written material from your doctor or pharmacy?: Never Writes: Yes Employment Status: Disabled Marine scientist Issues: NA Guardian/Conservator: None-according to MD pt is not capable of making her own decisions at this time. Will need to look toward son;s since they  are next of kin,  but parents are local   Abuse/Neglect Abuse/Neglect Assessment Can Be Completed: Yes Physical Abuse: Denies Verbal Abuse: Denies Sexual Abuse: Denies Exploitation of patient/patient's resources: Denies Self-Neglect: Denies  Patient response to: Social Isolation - How often do you feel lonely or isolated from those around you?: Never  Emotional Status Pt's affect, behavior and adjustment status: Pt is very sleepy and trying to wake up.  Parents reports it was a medication given to her, which has since been stopped. Pt was mod/i with a rolling walker according to parents. She lived alone and had her PCS aide 3x week. Recent Psychosocial Issues: other health issues-hx-CVA 2017 Psychiatric History: No history but parents feels she has something-will ask neuro-psych to see while here Substance Abuse History: hx-substance use  Patient / Family Perceptions, Expectations & Goals Pt/Family understanding of illness & functional limitations: Pt does realize she is here due to a CVA, her parents have a good understanding and are hopeful she will recover.  They have spoken with the MD rounding Premorbid pt/family roles/activities: mom, daughter, retiree, neighbor, etc Anticipated changes in roles/activities/participation: resume Pt/family expectations/goals: Pt states: " I want to go home."  Dad states: " We are hoping she will do well and be able to go back to her apartment. "  [employer]: Other (Comment) (PCS 3x week for home management and grocery shopping) Premorbid Home Care/DME Agencies: Other (Comment) (rolling walker and 3 in 1) Transportation available at discharge: PCS worker and parents Is the patient able to respond to transportation needs?: Yes In the past 12 months, has lack of transportation kept you from medical appointments or from getting medications?: No In the past 12 months, has lack of transportation kept you from meetings, work, or from getting things needed for daily living?: No Resource referrals recommended: Neuropsychology  Discharge Planning Living Arrangements: Alone Support Systems: Children, Parent, Friends/neighbors, Home care staff Type of Residence: Private residence Insurance Resources: Medicaid (specify county) ([employer] Medicaid) Financial Resources: SSI Financial Screen Referred: No Living Expenses: Rent Money Management: Patient Does the patient have any problems  obtaining your medications?: No Home Management: Scientist, product/process development Patient/Family Preliminary Plans: Return home to apartment with intermittent assist from parents and Scientist, product/process development. Parents did not offer for her to come with them, want to await progress. Option of going to son's in [facility] like referred to in the [facility] note he works and she would have to re-apply for mediciad in that state. Care Coordinator Barriers to Discharge: Insurance for SNF coverage, Decreased caregiver support, Lack of/limited family support Care Coordinator Anticipated Follow Up Needs: HH/OP  Clinical Impression Pt is not able to participate in therapies at this time due to too groggy from a medication that has been discontinued. Her parents are here and supportive but not able to offer much assist to her. All want her to be able to return to her apartment with her PCS aide at discharge. Son's home in [facility] not an option he works and she would need to re-apply for medicaid done there once arrived. Will see how pt progresses and see if feasible to go to her apartment at mod/I level. Will work on best plan for pt. Will ask neuro-psych to see with history.  Lucy Chris 04/23/2023, 11:25 AM

## 2023-04-23 NOTE — Progress Notes (Signed)
Physical Therapy Session Note  Patient Details  Name: Brittany [name] MRN: [id number] Date of Birth: Aug 28, 1965  Today's Date: 04/23/2023 PT Missed Time: 45 Minutes Missed Time Reason: Patient unwilling to participate;Patient fatigue  Short Term Goals: Week 1:  PT Short Term Goal 1 (Week 1): Pt will perform bed mobility from flat bed surface and no rails with Mod I. PT Short Term Goal 2 (Week 1): Pt will perform functional transfers with no AD and supervision. PT Short Term Goal 3 (Week 1): Pt will ambulate short, household distances with no AD and close supervision. PT Short Term Goal 4 (Week 1): Pt will ambulate community distances using rollator with supervision. PT Short Term Goal 5 (Week 1): Pt wil complete outcome measure.  Skilled Therapeutic Interventions/Progress Updates:       Pt supine in bed upon arrival. Pt shaking head when therapist mentioning performing therex in bed as well as assisting pt to bathroom. Pt nodding head when asked if she is tired, and when asked if she is trying to sleep.   Pt missed 45 min 2/2 fatigue/sleepiness/refusal. Will attempt to make up missed time as able.   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

## 2023-04-23 NOTE — Evaluation (Signed)
Speech Language Pathology Assessment and Plan  Patient Details  Name: Brittany [name] MRN: [id number] Date of Birth: [birth year]  SLP Diagnosis: Aphasia;Apraxia;Speech and Language deficits  Rehab Potential: Good ELOS: 10-12 days   Today's Date: 04/23/2023 SLP Individual Time: 4098-1191 SLP Individual Time Calculation (min): 54 min  Hospital Problem: Principal Problem:   Acute ischemic left middle cerebral artery (MCA) stroke (HCC)  Past Medical History:  Past Medical History:  Diagnosis Date   Anemia    Arthritis    trigger finger in left hand   Complication of anesthesia    per pt, hard to wake up!   Elevated cholesterol    Female bladder prolapse    per urologist, does not have prolaspe   GERD (gastroesophageal reflux disease)    Heart murmur    pt unsure.   Hiatal hernia    Hypertension    IBS (irritable bowel syndrome)    Multiple myeloma (HCC) 2005   had partial chemo   Peripheral neuropathy    SOB (shortness of breath) on exertion    uses an inhaler   Stroke (HCC) 2017   paralysis left arm/uses a walker   Past Surgical History:  Past Surgical History:  Procedure Laterality Date   BONE BIOPSY  2005   in her back   COLONOSCOPY     over 10 years x3   ESOPHAGOGASTRODUODENOSCOPY     incomplete-over 10 years ago    IR CT HEAD LTD  04/16/2023   IR CT HEAD LTD  04/16/2023   IR INTRA CRAN STENT  04/16/2023   IR PERCUTANEOUS ART THROMBECTOMY/INFUSION INTRACRANIAL INC DIAG ANGIO  04/16/2023   RADIOLOGY WITH ANESTHESIA N/A 04/16/2023   Procedure: IR WITH ANESTHESIA;  Surgeon: Julieanne Cotton, MD;  Location: [facility] OR;  Service: Radiology;  Laterality: N/A;   TEE WITHOUT CARDIOVERSION N/A 12/08/2016   Procedure: TRANSESOPHAGEAL ECHOCARDIOGRAM (TEE);  Surgeon: Quintella Reichert, MD;  Location: [facility] ENDOSCOPY;  Service: Cardiovascular;  Laterality: N/A;   TUBAL LIGATION     UPPER GASTROINTESTINAL ENDOSCOPY     WISDOM TOOTH EXTRACTION     Assessment / Plan /  Recommendation Clinical Impression Pt is a 58 y/o female with PMH of previous L cerebellar stroke, HTN, peripheral neuropathy, and multiple myeloma who presented to [facility] on 04/12/23 with c/o 1-2 week history of word finding deficits and dysarthria. She was found to have subacute L MCA infarct due to severe L MCA stenosis at the time. On 1/27 code stroke called due to AMS with right gaze deviation and increased aphasia. CTA revealed proximal left M1 occlusion and she underwent mechanical thrombectomy. MRI showed infarct in the low left basal ganglia with intraparenchymal hemorrhage. Therapy evaluations completed and pt was recommended for CIR.   Speech-Language Evaluation: Patient presents with severe receptive/expressive language deficits impacting her ability to understand and express wants/needs functionally. Patient was joined by her parents throughout session who assisted with collection of patient history, baseline function information, and communication of wants/needs throughout. SLP administered Bedside WAB which revealed significant deficits in the areas of content, fluency, yes/no questions (50% accuracy), following directions (1/10), repetition (1.5/10), naming (0/10), and writing (0/10). During additional testing, patient scored 2/10 on Apraxia screening. Per Bedside WAB classification, patient presents with global aphasia, though suspect receptive language is slightly better than expressive. Patient unable to verbally name objects in the environment but identified written name of object from field of 2 (unable to identify in field of 4). Patient can write full name  when copying but is unable to write spontaneously. Patient has communication board in room but does not use effectively/consistently. Patient engaged in singing task and hummed/approximated lyrics inconsistently. When patient did verbalize during session, it was characterized by approximations, phonemic paraphasias, and significant  hesitancy.   Patient would benefit from SLP services during remainder of CIR stay to target all aforementioned deficits. Patient was left in lowered bed with call bell in reach and bed alarm set. SLP will continue to target goals per plan of care.     Skilled Therapeutic Interventions          SLP conducted skilled evaluation session to assess speech and language function. Utilized patient/family interview and Bedside Western Aphasia Battery as well as assessment of functional environmental tasks. Full results above.    SLP Assessment  Patient will need skilled Speech Lanaguage Pathology Services during CIR admission    Recommendations  Oral Care Recommendations: Oral care BID Patient destination: Home Follow up Recommendations: Outpatient SLP;24 hour supervision/assistance Equipment Recommended: None recommended by SLP    SLP Frequency 3 to 5 out of 7 days   SLP Duration  SLP Intensity  SLP Treatment/Interventions 10-12 days  Minumum of 1-2 x/day, 30 to 90 minutes  Multimodal communication approach;Speech/Language facilitation;Environmental controls;Cueing hierarchy;Functional tasks;Therapeutic Activities;Internal/external aids;Patient/family education    Pain Pain Assessment Pain Scale: 0-10 Pain Score: 0-No pain Faces Pain Scale: No hurt  Prior Functioning Cognitive/Linguistic Baseline: Within functional limits Baseline deficit details: Family at bedside reports patient is fully independent with all ADL/iADLs at baseline. 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)  SLP Evaluation Cognition Overall Cognitive Status: Impaired/Different from baseline Arousal/Alertness: Awake/alert Orientation Level: Oriented X4 Year:  (UTA d/t aphasia) Month:   (UTA d/t aphasia) Day of Week:  (UTA d/t aphasia) Attention: Sustained Sustained Attention: Appears intact Memory:  (UTA d/t aphasia) Awareness: Impaired Awareness Impairment: Intellectual impairment Problem Solving:  (UTA d/t aphasia) Behaviors: Restless;Perseveration Safety/Judgment: Impaired  Comprehension Auditory Comprehension Overall Auditory Comprehension: Impaired Yes/No Questions: Impaired Basic Biographical Questions: 51-75% accurate Complex Questions: 25-49% accurate Commands: Impaired One Step Basic Commands: 0-24% accurate Two Step Basic Commands: 0-24% accurate Multistep Basic Commands: 0-24% accurate Conversation: Simple Other Conversation Comments: awareness of receptive deficits is poor Interfering Components: Attention EffectiveTechniques: Extra processing time;Repetition;Slowed speech;Stressing words;Visual/Gestural cues Visual Recognition/Discrimination Discrimination: Not tested Reading Comprehension Reading Status: Impaired Word level: Impaired Sentence Level: Impaired Paragraph Level: Not tested Functional Environmental (signs, name badge): Impaired Effective Techniques: Verbal cueing;Visual cueing;Eye glasses Expression Expression Primary Mode of Expression: Verbal Verbal Expression Overall Verbal Expression: Impaired Initiation: No impairment Automatic Speech: Singing Level of Generative/Spontaneous Verbalization: Phrase Repetition: Impaired Level of Impairment: Word level Naming: Impairment Responsive: 0-25% accurate Confrontation: Impaired Convergent: Not tested Divergent: Not tested Other Naming Comments: repetition/modeling can help intermittently, patient able to identify from written field of 2 choices Verbal Errors: Perseveration;Aware of errors;Phonemic paraphasias Pragmatics: No impairment Interfering Components: Attention;Premorbid deficit Effective Techniques: Semantic cues;Phonemic cues;Written cues;Sentence  completion;Articulatory cues;Melodic intonation Non-Verbal Means of Communication: Gestures;Communication board Other Verbal Expression Comments: n/a Written Expression Dominant Hand: Right Written Expression: Exceptions to [facility] Copy Ability: Phrase Self Formulation Ability:  (unable to write unless copying) Oral Motor Oral Motor/Sensory Function Overall Oral Motor/Sensory Function: Within functional limits Motor Speech Overall Motor Speech: Appears within functional limits for tasks assessed Respiration: Within functional limits Phonation: Normal Resonance:  Within functional limits Articulation: Within functional limitis Intelligibility: Intelligible Motor Planning: Impaired Level of Impairment: Word Motor Speech Errors: Groping for words;Consistent (suspect aphasia>apraxia) Effective Techniques: Slow rate;Pause  Care Tool Care Tool Cognition Ability to hear (with hearing aid or hearing appliances if normally used Ability to hear (with hearing aid or hearing appliances if normally used): 0. Adequate - no difficulty in normal conservation, social interaction, listening to TV   Expression of Ideas and Wants Expression of Ideas and Wants: 1. Rarely/Never expressess or very difficult - rarely/never expresses self or speech is very difficult to understand   Understanding Verbal and Non-Verbal Content Understanding Verbal and Non-Verbal Content: 3. Usually understands - understands most conversations, but misses some part/intent of message. Requires cues at times to understand  Memory/Recall Ability Memory/Recall Ability : None of the above were recalled   Intelligibility: Intelligible  Short Term Goals: Week 1: SLP Short Term Goal 1 (Week 1): Patient will answer basic environmental and biographical yes/no questions with 90% accuracy given max assist. SLP Short Term Goal 2 (Week 1): Patient will follow 1-step directions with 90% accuracy given max assist. SLP Short Term Goal 3 (Week  1): Patient will verbalize trained word-level functional utterances in 8/10 opportunities given max assist.  Refer to Care Plan for Long Term Goals  Recommendations for other services: None   Discharge Criteria: Patient will be discharged from SLP if patient refuses treatment 3 consecutive times without medical reason, if treatment goals not met, if there is a change in medical status, if patient makes no progress towards goals or if patient is discharged from hospital.  The above assessment, treatment plan, treatment alternatives and goals were discussed and mutually agreed upon: by patient  Jeannie Done, M.A., CCC-SLP  Yetta Barre 04/23/2023, 12:51 PM

## 2023-04-23 NOTE — Progress Notes (Signed)
Referring Physician(s): Dr. Wynn Banker  Supervising Physician: Julieanne Cotton  Patient Status:  [facility] - In-pt  Chief Complaint: Groin hematoma  Subjective: Patient resting in bed.  Nods head appropriately, no verbalization.  Indicates tenderness at groin procedure site.  RN and family and bedside for eval.   Allergies: Amitriptyline, Duloxetine, Naproxen, Other, Beef-derived drug products, Pork-derived products, Darvon [propoxyphene], Hydrocodone, Lactose intolerance (gi), Latex, Oxycodone, Percocet [oxycodone-acetaminophen], and Topamax [topiramate]  Medications: Prior to Admission medications   Medication Sig Start Date End Date Taking? Authorizing Provider  amLODipine (NORVASC) 10 MG tablet Take 1 tablet (10 mg total) by mouth daily. Patient not taking: Reported on 04/12/2023 12/09/22 01/08/23  Arrien, York Ram, MD  atorvastatin (LIPITOR) 80 MG tablet Take 1 tablet (80 mg total) by mouth daily. 07/06/22 07/06/23  Clayborne Dana, NP  cholecalciferol (CHOLECALCIFEROL) 25 MCG tablet Take 1 tablet (1,000 Units total) by mouth daily. 04/21/23   Lynnae January, NP  docusate sodium (COLACE) 100 MG capsule Take 1 capsule (100 mg total) by mouth daily. 04/21/23   Lynnae January, NP  ezetimibe (ZETIA) 10 MG tablet Take 1 tablet (10 mg total) by mouth daily. 04/20/23   Lynnae January, NP  gabapentin (NEURONTIN) 300 MG capsule Take 1 capsule (300 mg total) by mouth 3 (three) times daily. 07/07/22   Clayborne Dana, NP  heparin 16109 UT/250ML infusion Inject 850 Units/hr into the vein continuous. 04/20/23   Lynnae January, NP  hydrALAZINE (APRESOLINE) 50 MG tablet Take 1 tablet (50 mg total) by mouth every 8 (eight) hours. 12/09/22 04/12/23  Arrien, York Ram, MD  linaclotide [facility]) 72 MCG capsule Take 1 capsule (72 mcg total) by mouth daily before breakfast. 07/26/22   Clayborne Dana, NP  ticagrelor (BRILINTA) 90 MG TABS tablet Take 1 tablet (90 mg total) by mouth 2 (two) times daily.  04/20/23   Lynnae January, NP     Vital Signs: BP (!) 145/80 (BP Location: Right Arm)   Pulse (!) 110   Temp 98.4 F (36.9 C) (Oral)   Resp 16   Ht 5\' 2"  (1.575 m)   Wt 148 lb 9.4 oz (67.4 kg)   SpO2 100%   BMI 27.18 kg/m   Physical Exam NAD, alert Neuro: alert, EOMs intact.  Able track.  Follows commands.  No verbalization during visit. Moving all extremities.  Groin: groin site is tender with exquisite tenderness noted directly adjacent to the puncture site.  Thigh is soft, no swelling.  Ecchymosis noted to be extending to the pubis and lateral hip, but not exquisitely tender in this area.   Imaging: VAS [place] GROIN PSEUDOANEURYSM Result Date: 04/22/2023  ARTERIAL PSEUDOANEURYSM  Patient Name:  Brittany [name]  Date of Exam:   04/22/2023 Medical Rec #: [id number]              Accession #:    [id number] Date of Birth: 1965/06/22              Patient Gender: F Patient Age:   58 years Exam Location:  [facility] Procedure:      VAS [place] Bobetta Lime Referring Phys: Anders Grant --------------------------------------------------------------------------------  Exam: Right groin Indications: Patient complains of groin pain and bruising. History: S/p arteriogram 04/16/23. Limitations: Edema, pain with pressure Comparison Study: No prior study Performing Technologist: Sherren Kerns RVS  Examination Guidelines: A complete evaluation includes B-mode imaging, spectral Doppler, color Doppler, and power Doppler as needed of all accessible portions of  each vessel. Bilateral testing is considered an integral part of a complete examination. Limited examinations for reoccurring indications may be performed as noted.  Summary: No evidence of pseudoaneurysm or AVF. There is a small hematoma noted as well as a lymph node at area of concern. The partial CFV DVT found 04/20/2023 appears to be resolving.    --------------------------------------------------------------------------------     Preliminary    VAS [place] LOWER EXTREMITY VENOUS (DVT) Result Date: 04/21/2023  Lower Venous DVT Study Patient Name:  Brittany [name]  Date of Exam:   04/20/2023 Medical Rec #: [id number]              Accession #:    [id number] Date of Birth: 07-Dec-1965              Patient Gender: F Patient Age:   28 years Exam Location:  [facility] Procedure:      VAS [place] LOWER EXTREMITY VENOUS (DVT) Referring Phys: Marvel Plan --------------------------------------------------------------------------------  Indications: Stroke, Embolic stroke., and Post-op.  Comparison Study: Previous study on 9.21.2018. Performing Technologist: Fernande Bras  Examination Guidelines: A complete evaluation includes B-mode imaging, spectral Doppler, color Doppler, and power Doppler as needed of all accessible portions of each vessel. Bilateral testing is considered an integral part of a complete examination. Limited examinations for reoccurring indications may be performed as noted. The reflux portion of the exam is performed with the patient in reverse Trendelenburg.  +---------+---------------+---------+-----------+----------+--------------+ RIGHT    CompressibilityPhasicitySpontaneityPropertiesThrombus Aging +---------+---------------+---------+-----------+----------+--------------+ CFV      Partial        Yes      Yes                  Acute          +---------+---------------+---------+-----------+----------+--------------+ SFJ      Partial        Yes      Yes                                 +---------+---------------+---------+-----------+----------+--------------+ FV Prox  Full                                                        +---------+---------------+---------+-----------+----------+--------------+ FV Mid   Full                                                        +---------+---------------+---------+-----------+----------+--------------+ FV DistalFull                                                         +---------+---------------+---------+-----------+----------+--------------+ PFV      Full                                                        +---------+---------------+---------+-----------+----------+--------------+  POP      Full           Yes      Yes                                 +---------+---------------+---------+-----------+----------+--------------+ PTV      Full                                                        +---------+---------------+---------+-----------+----------+--------------+ PERO     Full                                                        +---------+---------------+---------+-----------+----------+--------------+ S/P revascularization of occluded left M1 with right CFA being point of access  +---------+---------------+---------+-----------+----------+--------------+ LEFT     CompressibilityPhasicitySpontaneityPropertiesThrombus Aging +---------+---------------+---------+-----------+----------+--------------+ CFV      Full           Yes      Yes                                 +---------+---------------+---------+-----------+----------+--------------+ SFJ      Full           Yes      Yes                                 +---------+---------------+---------+-----------+----------+--------------+ FV Prox  Full                                                        +---------+---------------+---------+-----------+----------+--------------+ FV Mid   Full                                                        +---------+---------------+---------+-----------+----------+--------------+ FV DistalFull                                                        +---------+---------------+---------+-----------+----------+--------------+ PFV      Full                                                        +---------+---------------+---------+-----------+----------+--------------+ POP      Full            Yes      Yes                                 +---------+---------------+---------+-----------+----------+--------------+  PTV      Full                                                        +---------+---------------+---------+-----------+----------+--------------+ PERO     Full                                                        +---------+---------------+---------+-----------+----------+--------------+     Summary: RIGHT: - Findings consistent with acute deep vein thrombosis involving the right common femoral vein, and SF junction.  - No cystic structure found in the popliteal fossa.  LEFT: - There is no evidence of deep vein thrombosis in the lower extremity.  - No cystic structure found in the popliteal fossa.  *See table(s) above for measurements and observations. Electronically signed by Lemar Livings MD on 04/21/2023 at 4:06:36 PM.    Final    DG Abd 1 View Result Date: 04/20/2023 CLINICAL DATA:  Right lower quadrant abdominal pain. History of stroke. EXAM: ABDOMEN - 1 VIEW COMPARISON:  04/17/2023 FINDINGS: Gas-filled colon. No large or small bowel distention. No radiopaque stones. Visualized bones and soft tissue contours appear intact. Lung bases are clear. IMPRESSION: Normal nonobstructive bowel gas pattern. Electronically Signed   By: Burman Nieves M.D.   On: 04/20/2023 22:32    Labs:  CBC: Recent Labs    04/20/23 0704 04/21/23 0605 04/22/23 0202 04/23/23 0555  WBC 5.0 4.4 4.1 4.6  HGB 12.6 13.3 13.2 13.4  HCT 37.2 39.1 39.7 39.7  PLT 180 181 188 200    COAGS: Recent Labs    04/12/23 1255  INR 1.0    BMP: Recent Labs    04/18/23 0610 04/20/23 0704 04/21/23 0605 04/23/23 0555  NA 137 139 138 135  K 3.8 3.3* 3.3* 4.6  CL 108 105 101 102  CO2 18* 22 20* 22  GLUCOSE 79 101* 87 84  BUN 14 11 16 17   CALCIUM 8.3* 9.0 9.3 8.9  CREATININE 1.22* 1.07* 1.19* 0.97  GFRNONAA 52* >60 53* >60    LIVER FUNCTION TESTS: Recent Labs     12/06/22 0154 12/07/22 0257 12/09/22 0327 04/12/23 1255 04/21/23 0605  BILITOT 0.9 0.9  --  0.5 0.8  AST 20 20  --  25 40  ALT 15 12  --  15 24  ALKPHOS 133* 116  --  123 96  PROT 7.7 6.8  --  8.2* 7.2  ALBUMIN 3.9 3.5 3.4* 4.3 3.6    Assessment and Plan: Hematoma s/p neurovascular intervention with Dr. Corliss Skains Prelim findings for Vas [place] to groin yesterday shows presence of hematoma.  Patient also to transition from IV hep gtt to Eliquis today.  Patient appears to have limited insight into safety, precautions for groin protection.  Restless with bending, readjusting legs frequently during visit.  Recommend limited PT/mobility today.  Ok for upper extremity, light activity as able.  If improvement noted in the next 24-48 hrs then likely ok to advance with therapy.  IR will follow.  Area of bruising marked with pen today during visit.   Electronically Signed: Hoyt Koch, PA 04/23/2023, 10:20 AM   I spent a  total of 25 Minutes at the the patient's bedside AND on the patient's hospital floor or unit, greater than 50% of which was counseling/coordinating care for hematoma.

## 2023-04-23 NOTE — Progress Notes (Addendum)
PROGRESS NOTE   Subjective/Complaints:  Patient drowsy this morning.  Rapid response was called, getting ABG.  She did get some tramadol last night.  I am reports she does not do well with tramadol.  Later in the morning around 9:20 AM she woke up and returned to her baseline functioning.   ROS: limited due to aphasia.   Objective:   VAS [place] GROIN PSEUDOANEURYSM Result Date: 04/22/2023  ARTERIAL PSEUDOANEURYSM  Patient Name:  Brittany [name]  Date of Exam:   04/22/2023 Medical Rec #: [id number]              Accession #:    [id number] Date of Birth: [birth year]              Patient Gender: F Patient Age:   58 years Exam Location:  [facility] Procedure:      VAS [place] Bobetta Lime Referring Phys: Anders Grant --------------------------------------------------------------------------------  Exam: Right groin Indications: Patient complains of groin pain and bruising. History: S/p arteriogram 04/16/23. Limitations: Edema, pain with pressure Comparison Study: No prior study Performing Technologist: Sherren Kerns RVS  Examination Guidelines: A complete evaluation includes B-mode imaging, spectral Doppler, color Doppler, and power Doppler as needed of all accessible portions of each vessel. Bilateral testing is considered an integral part of a complete examination. Limited examinations for reoccurring indications may be performed as noted.  Summary: No evidence of pseudoaneurysm or AVF. There is a small hematoma noted as well as a lymph node at area of concern. The partial CFV DVT found 04/20/2023 appears to be resolving.    --------------------------------------------------------------------------------    Preliminary    Recent Labs    04/22/23 0202 04/23/23 0555  WBC 4.1 4.6  HGB 13.2 13.4  HCT 39.7 39.7  PLT 188 200   Recent Labs    04/21/23 0605 04/23/23 0555  NA 138 135  K 3.3* 4.6  CL 101 102  CO2 20* 22  GLUCOSE  87 84  BUN 16 17  CREATININE 1.19* 0.97  CALCIUM 9.3 8.9         Intake/Output Summary (Last 24 hours) at 04/23/2023 1135 Last data filed at 04/22/2023 1354 Gross per 24 hour  Intake 237 ml  Output 300 ml  Net -63 ml        Physical Exam: Vital Signs Blood pressure (!) 145/80, pulse (!) 110, temperature 98.4 F (36.9 C), temperature source Oral, resp. rate 16, height 5\' 2"  (1.575 m), weight 67.4 kg, SpO2 100%.  Constitutional: Laying in bed, no acute distress HENT:   Atraumatic, normocephalic. Eyes: PERRLA. EOM seem intact. No notable nystagmus.  Cardiovascular: RRR, no murmurs/rub/gallops. No Edema. Peripheral pulses 2+  Respiratory: CTAB. No rales, rhonchi, or wheezing. On RA.  Abdomen: soft, + bowel sounds, more normoactive today. Nondistended today; no areas of tenderness to the abdomen today.  Skin: C/D/I. No apparent lesions. + IV running heparin - intact.  Extremities: R groin with large ?hematoma in the inguinal canal area, not indurated but firm, somewhat rubbery, exquisitely tender, no overlying erythema or warmth, scattered bruising to this area and further proximally approaching the anterolateral thigh where there is a larger bruise.  Neuro: Initially  lethargic, wakes briefly to sternal rub then goes back to sleep.  Expressive aphasia.  Rapid response was gait able to get her to squeeze hand.  She did wake up a few minutes later.  + mild facial droop.  PRIOR EXAMS: MSK:      No apparent deformity. No joint pain on ROM.        Neurologic exam:  Cognition: Awake, alert. Responds to 1/3 verbal commands,  2/3 commands with physical cues.  + Severe R hemineglect Language: Global aphasia, expressive > receptive. Anomic. Dysarthric.  Insight: Poor insight into current condition.  Mood: Anxious,  but appropriate and interactive.  Sensation: To light touch reduced on RLE Reflexes: Hyporeflexic RLE, 2+ RUE; 1+ LUE and LLE. Negative Hoffman's and babinski signs bilaterally.   CN: + Mild facial droop  Coordination: No apparent tremors.   Spasticity: MAS 1-2 R elbow flexors, 1 elbow extensors, 1 wrist flexors      Strength: 5/5 LUE and LLE;       R side difficult to assess due to neglect.  RUE 1/5 grip, elbow flexion c/b motor apraxia RLE   3/5 x1 with R hip flexor and knee flexor; no ankle movement.     Assessment/Plan: 1. Functional deficits which require 3+ hours per day of interdisciplinary therapy in a comprehensive inpatient rehab setting. Physiatrist is providing close team supervision and 24 hour management of active medical problems listed below. Physiatrist and rehab team continue to assess barriers to discharge/monitor patient progress toward functional and medical goals  Care Tool:  Bathing    Body parts bathed by patient: Right arm, Left arm, Chest, Abdomen, Front perineal area, Buttocks, Right upper leg, Left upper leg, Right lower leg, Left lower leg, Face         Bathing assist Assist Level: Contact Guard/Touching assist     Upper Body Dressing/Undressing Upper body dressing   What is the patient wearing?: Hospital gown only    Upper body assist Assist Level: Minimal Assistance - Patient > 75%    Lower Body Dressing/Undressing Lower body dressing      What is the patient wearing?: Underwear/pull up, Pants     Lower body assist Assist for lower body dressing: Minimal Assistance - Patient > 75%     Toileting Toileting    Toileting assist Assist for toileting: Contact Guard/Touching assist     Transfers Chair/bed transfer  Transfers assist     Chair/bed transfer assist level: Minimal Assistance - Patient > 75%     Locomotion Ambulation   Ambulation assist      Assist level: Minimal Assistance - Patient > 75% Assistive device: Hand held assist (HHA or using IV pole) Max distance: 75 ft   Walk 10 feet activity   Assist     Assist level: Contact Guard/Touching assist Assistive device: Other (comment)  (IV pole)   Walk 50 feet activity   Assist    Assist level: Minimal Assistance - Patient > 75% Assistive device: Other (comment) (IV pole)    Walk 150 feet activity   Assist Walk 150 feet activity did not occur: Safety/medical concerns         Walk 10 feet on uneven surface  activity   Assist Walk 10 feet on uneven surfaces activity did not occur: Safety/medical concerns         Wheelchair     Assist Is the patient using a wheelchair?: Yes Type of Wheelchair: Manual    Wheelchair assist level: Dependent - Patient  0% Max wheelchair distance: 250    Wheelchair 50 feet with 2 turns activity    Assist        Assist Level: Dependent - Patient 0%   Wheelchair 150 feet activity     Assist      Assist Level: Dependent - Patient 0%   Blood pressure (!) 145/80, pulse (!) 110, temperature 98.4 F (36.9 C), temperature source Oral, resp. rate 16, height 5\' 2"  (1.575 m), weight 67.4 kg, SpO2 100%.  Medical Problem List and Plan: 1. Functional deficits secondary to left MCA infarct due to severe left MCA stenosis; notable Hx prior L cerebellar CVAs             -patient may not shower while on continuous heparin drip             -ELOS/Goals: 10 days, SPV PT/OT, Min A SLP             -04/21/23 CIR evals today; ordered SLP eval since it wasn't on there  -Continue CIR  -Patient was sedated this morning, initially head CT was ordered but discontinued as she woke up   2.  R-CFV DVT (found 1/31)/Antithrombotics: -DVT/anticoagulation:  Pharmaceutical: Heparin low dose X 48 hours and if stable transition to DOAC              -antiplatelet therapy: Brillinta. ASA d/c due to addition of Heparin.  - Per lit review, early mobilization current recommendation, without gross restrictions; no convincing evidence of increased PE risk.   -04/22/23 long discussion with pt and brother regarding clot location, this coincides with area of pain, large palpable ?hematoma to R  groin; had mechanical thrombectomy-- called IR Dr. Richarda Overlie who relayed to the team, Anders Grant NP will come see the patient, appreciate their assistance with this case. For now, added Tramadol 100mg  q8h PRN to help with pain.  -04/23/23 Vas U/S with hematoma/continued DVT-smaller, Pt seen by vascular this AM - recommend avoiding bending or stooping, limited mobility- transfers only for 1-2 days.  Ok to transition to eliquis    3. Pain Management:  Tylenol prn. Gabapentin 300mg  TID -04/22/23 added Tramadol 100mg  q8h PRN for pain in R groin (see above)-- pt allergic/intolerant to other hydro/oxycodone.  4. Mood/Behavior/Sleep: LCSW to follow for evaluation and support.              -antipsychotic agents: N/A -04/21/23 didn't sleep well, will add melatonin 5mg  PRN, has trazodone PRN as well -04/23/23 will discontinue tramadol, Benadryl as needed, DC melatonin, DC hydrocodone 5. Neuropsych/cognition: This patient is not capable of making decisions on her own behalf. 6. Skin/Wound Care: Routine pressure relief measures.  7. Fluids/Electrolytes/Nutrition: Monitor I/O. Check routine labs -04/21/23 Cr 1.19 a little up from yesterday but down from prior, monitor; K 3.3, see below; remainder of CMP unremarkable, monitor labs 8. HTN: Monitor BP TID--continue amlodipine 10mg  daily and hydralazine 50mg  q8h             --avoid hypoperfusion.   -2/3 BP a little elevated monitor for now Vitals:   04/22/23 0407 04/22/23 0623 04/22/23 1010 04/22/23 1351  BP: 125/67 125/67 (!) 142/89 (!) 162/87   04/22/23 1438 04/22/23 1929 04/22/23 2048 04/23/23 0155  BP: 138/79 (!) 155/84 (!) 155/84 (!) 156/74   04/23/23 0625 04/23/23 0818 04/23/23 0839 04/23/23 0931  BP: (!) 156/74 (!) 162/84 (!) 162/81 (!) 145/80    9. RLQ pain:  Felt to be due to IBS. Has been on miralax daily  and Senna S 2 tabs nightly without results --Linzess every day was started today. Will get KUB  -04/21/23 KUB with gas filled colon but  otherwise neg/nonobstructive; reviewed images myself, suspect stool burden contributory to gas trapped in R side; will increase miralax/colace to BID, leave senna S as is, add sorbitol 30ml PRN if no BM today; has PRN dulcolax supp and fleet if needed too; monitor closely -04/22/23 pt insists she's had BMs daily, but no documentation; no abd pain today, appears it's R groin pain-- see above #2-- tramadol added, leave stool softeners in place, NEED DOCUMENTATION OF STOOLS -2/3 LBM yesterday 10. Hypokalemia: Supplement added X 2 days.  -04/21/23 K 3.3, potassium supplement not ordered, daily x2 days added today -stable today 11. Hyperlipidemia: LDL-124 on Lipitor 80 mg and Zetia 10mg  daily.  12. Pre-diabetes: Hgb A1c- 5.9 and every 4 hours CBS have shown BS controlled. --Will monitor fasting BS on serial labs.  13. Substance abuse: Educated on stroke risk w/marijuana.  14. H/o MM: Acute renal failure improving SCr 1.11 @ admission-->1.59-->1.07  -04/21/23 Cr 1.19, monitor  -2/3 Cr improved to 0.97 15. GERD: On protonix 40mg  daily     LOS: 3 days A FACE TO FACE EVALUATION WAS PERFORMED  Fanny Dance 04/23/2023, 11:35 AM

## 2023-04-23 NOTE — Discharge Instructions (Addendum)
Inpatient Rehab Discharge Instructions  Brittany [name] Discharge date and time: 05/03/23   Activities/Precautions/ Functional Status: Activity: no lifting, driving, or strenuous exercise till cleared by MD  Diet:  Soft food. Low fat/Low cholesterol Wound Care: none needed   Functional status:  ___ No restrictions      ___ Walk up steps independently _X__ 24/7 supervision/assistance   _X__ Walk up steps with assistance ___ Intermittent supervision/assistance  ___ Bathe/dress independently ___ Walk with walker     _X__ Bathe/dress with assistance ___ Walk Independently     ___ Shower independently _X__ Walk with supervision     ___ Shower with assistance _X__ No alcohol      ___ Return to work/school ________   Special Instructions: Brittany [name] is not able to handle medications or finances. Family needs to handle/administer medications   STROKE/TIA DISCHARGE INSTRUCTIONS SMOKING Cigarette smoking nearly doubles your risk of having a stroke & is the single most alterable risk factor  If you smoke or have smoked in the last 12 months, you are advised to quit smoking for your health. Most of the excess cardiovascular risk related to smoking disappears within a year of stopping. Ask you doctor about anti-smoking medications [place] Quit Line: 1-800-QUIT NOW Free Smoking Cessation Classes [phone]  CHOLESTEROL Know your levels; limit fat & cholesterol in your diet  Lipid Panel     Component Value Date/Time   CHOL 184 04/13/2023 0642   CHOL 213 (H) 12/01/2021 1410   TRIG 287 (H) 04/17/2023 0456   HDL 22 (L) 04/13/2023 0642   HDL 30 (L) 12/01/2021 1410   CHOLHDL 8.4 04/13/2023 0642   VLDL 38 04/13/2023 0642   LDLCALC 124 (H) 04/13/2023 0642   LDLCALC 156 (H) 12/01/2021 1410     Many patients benefit from treatment even if their cholesterol is at goal. Goal: Total Cholesterol (CHOL) less than 160 Goal:  Triglycerides (TRIG) less than 150 Goal:  HDL greater than 40 Goal:   LDL (LDLCALC) less than 100   BLOOD PRESSURE American Stroke Association blood pressure target is less that 120/80 mm/Hg  Your discharge blood pressure is:  BP: (!) 145/81 Monitor your blood pressure Limit your salt and alcohol intake Many individuals will require more than one medication for high blood pressure  DIABETES (A1c is a blood sugar average for last 3 months) Goal HGBA1c is under 7% (HBGA1c is blood sugar average for last 3 months)  Diabetes: No known diagnosis of diabetes    Lab Results  Component Value Date   HGBA1C 5.9 (H) 04/13/2023    Your HGBA1c can be lowered with medications, healthy diet, and exercise. Check your blood sugar as directed by your physician Call your physician if you experience unexplained or low blood sugars.  PHYSICAL ACTIVITY/REHABILITATION Goal is 30 minutes at least 4 days per week  Activity: No driving, Therapies: see above Return to work: N/A at this time. Activity decreases your risk of heart attack and stroke and makes your heart stronger.  It helps control your weight and blood pressure; helps you relax and can improve your mood. Participate in a regular exercise program. Talk with your doctor about the best form of exercise for you (dancing, walking, swimming, cycling).  DIET/WEIGHT Goal is to maintain a healthy weight  Your discharge diet is:  Diet Order             DIET DYS 3 Room service appropriate? No; Fluid consistency: Thin  Diet effective now  liquids Your height is:  Height: 5\' 2"  (157.5 cm) Your current weight is: 151. 6 lbs Your Body Mass Index (BMI) is:  BMI (Calculated): 27.78 Following the type of diet specifically designed for you will help prevent another stroke. Your goal weight range is:  136 lbs Your goal Body Mass Index (BMI) is 19-24. Healthy food habits can help reduce 3 risk factors for stroke:  High cholesterol, hypertension, and excess weight.  RESOURCES Stroke/Support Group:  Call  [phone]   STROKE EDUCATION PROVIDED/REVIEWED AND GIVEN TO PATIENT Stroke warning signs and symptoms How to activate emergency medical system (call 911). Medications prescribed at discharge. Need for follow-up after discharge. Personal risk factors for stroke. Pneumonia vaccine given:  Flu vaccine given:  My questions have been answered, the writing is legible, and I understand these instructions.  I will adhere to these goals & educational materials that have been provided to me after my discharge from the hospital.     My questions have been answered and I understand these instructions. I will adhere to these goals and the provided educational materials after my discharge from the hospital.  Patient/Caregiver Signature _______________________________ Date __________  Clinician Signature _______________________________________ Date __________  Please bring this form and your medication list with you to all your follow-up doctor's appointments. Information on my medicine - ELIQUIS (apixaban)  This medication education was reviewed with me or my healthcare representative as part of my discharge preparation.     Why was Eliquis prescribed for you? Eliquis was prescribed to treat blood clots that may have been found in the veins of your legs (deep vein thrombosis) or in your lungs (pulmonary embolism) and to reduce the risk of them occurring again.  What do You need to know about Eliquis ? The dose is ONE 5 mg tablet taken TWICE daily.  Eliquis may be taken with or without food.   Try to take the dose about the same time in the morning and in the evening. If you have difficulty swallowing the tablet whole please discuss with your pharmacist how to take the medication safely.  Take Eliquis exactly as prescribed and DO NOT stop taking Eliquis without talking to the doctor who prescribed the medication.  Stopping may increase your risk of developing a new blood clot.  Refill your  prescription before you run out.  After discharge, you should have regular check-up appointments with your healthcare provider that is prescribing your Eliquis.    What do you do if you miss a dose? If a dose of ELIQUIS is not taken at the scheduled time, take it as soon as possible on the same day and twice-daily administration should be resumed. The dose should not be doubled to make up for a missed dose.  Important Safety Information A possible side effect of Eliquis is bleeding. You should call your healthcare provider right away if you experience any of the following: Bleeding from an injury or your nose that does not stop. Unusual colored urine (red or dark brown) or unusual colored stools (red or black). Unusual bruising for unknown reasons. A serious fall or if you hit your head (even if there is no bleeding).  Some medicines may interact with Eliquis and might increase your risk of bleeding or clotting while on Eliquis. To help avoid this, consult your healthcare provider or pharmacist prior to using any new prescription or non-prescription medications, including herbals, vitamins, non-steroidal anti-inflammatory drugs (NSAIDs) and supplements.  This website has more information on Eliquis (  apixaban): http://www.eliquis.com/eliquis/home  ====================================================  Deep Vein Thrombosis    Deep vein thrombosis (DVT) is a condition in which a blood clot forms in a deep vein, such as a lower leg, thigh, or arm vein. A clot is blood that has thickened into a gel or solid. This condition is dangerous. It can lead to serious and even life-threatening complications if the clot travels to the lungs and causes a blockage (pulmonary embolism). It can also damage veins in the leg. This can result in leg pain, swelling, discoloration, and sores (post-thrombotic syndrome).  What are the causes? This condition may be caused by: A slowdown of blood flow. Damage  to a vein. A condition that causes blood to clot more easily, such as an inherited clotting disorder.  What increases the risk? The following factors may make you more likely to develop this condition: Being overweight. Being older, especially over age 49. Sitting or lying down for more than four hours. Being in the hospital. Lack of physical activity (sedentary lifestyle). Pregnancy, being in childbirth, or having recently given birth. Taking medicines that contain estrogen, such as medicines to prevent pregnancy. Smoking. A history of any of the following: Blood clots or a blood clotting disease. Peripheral vascular disease. Inflammatory bowel disease. Cancer. Heart disease. Genetic conditions that affect how your blood clots, such as Factor V Leiden mutation. Neurological diseases that affect your legs (leg paresis). A recent injury, such as a car accident. Major or lengthy surgery. A central line placed inside a large vein.  What are the signs or symptoms? Symptoms of this condition include: Swelling, pain, or tenderness in an arm or leg. Warmth, redness, or discoloration in an arm or leg. If the clot is in your leg, symptoms may be more noticeable or worse when you stand or walk. Some people may not develop any symptoms.  How is this diagnosed? This condition is diagnosed with: A medical history and physical exam. Tests, such as: Blood tests. These are done to check how well your blood clots. Ultrasound. This is done to check for clots. Venogram. For this test, contrast dye is injected into a vein and X-rays are taken to check for any clots  How is this treated? Treatment for this condition depends on: The cause of your DVT. Your risk for bleeding or developing more clots. Any other medical conditions that you have. Treatment may include: Taking a blood thinner (anticoagulant). This type of medicine prevents clots from forming. It may be taken by mouth, injected  under the skin, or injected through an IV (catheter). Injecting clot-dissolving medicines into the affected vein (catheter-directed thrombolysis). Having surgery. Surgery may be done to: Remove the clot. Place a filter in a large vein to catch blood clots before they reach the lungs. Some treatments may be continued for up to six months.  Follow these instructions at home: If you are taking blood thinners: Take the medicine exactly as told by your health care provider. Some blood thinners need to be taken at the same time every day. Do not skip a dose. Talk with your health care provider before you take any medicines that contain aspirin or NSAIDs. These medicines increase your risk for dangerous bleeding. Ask your health care provider about foods and drugs that could change the way the medicine works (may interact). Avoid those things if your health care provider tells you to do so. Blood thinners can cause easy bruising and may make it difficult to stop bleeding. Because of this: Be  very careful when using knives, scissors, or other sharp objects. Use an electric razor instead of a blade. Avoid activities that could cause injury or bruising, and follow instructions about how to prevent falls. Wear a medical alert bracelet or carry a card that lists what medicines you take.  General instructions Take over-the-counter and prescription medicines only as told by your health care provider. Return to your normal activities as told by your health care provider. Ask your health care provider what activities are safe for you. Wear compression stockings if recommended by your health care provider. Keep all follow-up visits as told by your health care provider. This is important.  How is this prevented? To lower your risk of developing this condition again: For 30 or more minutes every day, do an activity that: Involves moving your arms and legs. Increases your heart rate. When traveling for  longer than four hours: Exercise your arms and legs every hour. Drink plenty of water. Avoid drinking alcohol. Avoid sitting or lying for a long time without moving your legs. If you have surgery or you are hospitalized, ask about ways to prevent blood clots. These may include taking frequent walks or using anticoagulants. Stay at a healthy weight. If you are a woman who is older than age 21, avoid unnecessary use of medicines that contain estrogen, such as some birth control pills. Do not use any products that contain nicotine or tobacco, such as cigarettes and e-cigarettes. This is especially important if you take estrogen medicines. If you need help quitting, ask your health care provider.  Contact a health care provider if: You miss a dose of your blood thinner. Your menstrual period is heavier than usual. You have unusual bruising.  Get help right away if: You have: New or increased pain, swelling, or redness in an arm or leg. Numbness or tingling in an arm or leg. Shortness of breath. Chest pain. A rapid or irregular heartbeat. A severe headache or confusion. A cut that will not stop bleeding. There is blood in your vomit, stool, or urine. You have a serious fall or accident, or you hit your head. You feel light-headed or dizzy. You cough up blood.  These symptoms may represent a serious problem that is an emergency. Do not wait to see if the symptoms will go away. Get medical help right away. Call your local emergency services (911 in the [place] Do not drive yourself to the hospital. Summary Deep vein thrombosis (DVT) is a condition in which a blood clot forms in a deep vein, such as a lower leg, thigh, or arm vein. Symptoms can include swelling, warmth, pain, and redness in your leg or arm. This condition may be treated with a blood thinner (anticoagulant medicine), medicine that is injected to dissolve blood clots,compression stockings, or surgery. If you are prescribed  blood thinners, take them exactly as told. This information is not intended to replace advice given to you by your health care provider. Make sure you discuss any questions you have with your health care provider. Document Revised: 02/16/2017 Document Reviewed: 08/04/2016 Elsevier Patient Education  2020 Elsevier Inc.   COMMUNITY REFERRALS UPON DISCHARGE:    Home Health:   PT  OT  SP  RN                  Agency:[facility] Phone: [phone]     Medical Equipment/Items Ordered:ROLLATOR, TUB BENCH AND 3 IN 1  Agency/Supplier:[facility]   [phone]  PCS SERVICES TO RESUME VIA [facility] WILL CALL MOM TO ARRANGE COMING OUT 2/14

## 2023-04-24 ENCOUNTER — Inpatient Hospital Stay (HOSPITAL_COMMUNITY): Payer: Medicaid Other

## 2023-04-24 DIAGNOSIS — N179 Acute kidney failure, unspecified: Secondary | ICD-10-CM | POA: Diagnosis not present

## 2023-04-24 DIAGNOSIS — I63512 Cerebral infarction due to unspecified occlusion or stenosis of left middle cerebral artery: Secondary | ICD-10-CM | POA: Diagnosis not present

## 2023-04-24 DIAGNOSIS — R1031 Right lower quadrant pain: Secondary | ICD-10-CM | POA: Diagnosis not present

## 2023-04-24 DIAGNOSIS — I82411 Acute embolism and thrombosis of right femoral vein: Secondary | ICD-10-CM | POA: Diagnosis not present

## 2023-04-24 DIAGNOSIS — E876 Hypokalemia: Secondary | ICD-10-CM | POA: Diagnosis not present

## 2023-04-24 DIAGNOSIS — R109 Unspecified abdominal pain: Secondary | ICD-10-CM | POA: Diagnosis not present

## 2023-04-24 DIAGNOSIS — N281 Cyst of kidney, acquired: Secondary | ICD-10-CM | POA: Diagnosis not present

## 2023-04-24 DIAGNOSIS — I10 Essential (primary) hypertension: Secondary | ICD-10-CM | POA: Diagnosis not present

## 2023-04-24 LAB — BASIC METABOLIC PANEL
Anion gap: 12 (ref 5–15)
BUN: 17 mg/dL (ref 6–20)
CO2: 21 mmol/L — ABNORMAL LOW (ref 22–32)
Calcium: 9.1 mg/dL (ref 8.9–10.3)
Chloride: 103 mmol/L (ref 98–111)
Creatinine, Ser: 1.14 mg/dL — ABNORMAL HIGH (ref 0.44–1.00)
GFR, Estimated: 56 mL/min — ABNORMAL LOW (ref >60)
Glucose, Bld: 99 mg/dL (ref 70–99)
Potassium: 3.6 mmol/L (ref 3.5–5.1)
Sodium: 136 mmol/L (ref 135–145)

## 2023-04-24 LAB — CBC WITH DIFFERENTIAL/PLATELET
Abs Immature Granulocytes: 0 10*3/uL (ref 0.00–0.07)
Basophils Absolute: 0 10*3/uL (ref 0.0–0.1)
Basophils Relative: 0 %
Eosinophils Absolute: 0 10*3/uL (ref 0.0–0.5)
Eosinophils Relative: 0 %
HCT: 40.6 % (ref 36.0–46.0)
Hemoglobin: 13.8 g/dL (ref 12.0–15.0)
Lymphocytes Relative: 40 %
Lymphs Abs: 1.8 10*3/uL (ref 0.7–4.0)
MCH: 29.2 pg (ref 26.0–34.0)
MCHC: 34 g/dL (ref 30.0–36.0)
MCV: 86 fL (ref 80.0–100.0)
Monocytes Absolute: 0.7 10*3/uL (ref 0.1–1.0)
Monocytes Relative: 17 %
Neutro Abs: 1.9 10*3/uL (ref 1.7–7.7)
Neutrophils Relative %: 43 %
Platelets: 186 10*3/uL (ref 150–400)
RBC: 4.72 MIL/uL (ref 3.87–5.11)
RDW: 14.1 % (ref 11.5–15.5)
WBC: 4.4 10*3/uL (ref 4.0–10.5)
nRBC: 0 % (ref 0.0–0.2)
nRBC: 0 /100{WBCs}

## 2023-04-24 LAB — GLUCOSE, CAPILLARY: Glucose-Capillary: 120 mg/dL — ABNORMAL HIGH (ref 70–99)

## 2023-04-24 MED ORDER — QUETIAPINE FUMARATE 25 MG PO TABS
25.0000 mg | ORAL_TABLET | Freq: Every day | ORAL | Status: DC | PRN
Start: 2023-04-24 — End: 2023-05-03
  Administered 2023-04-24 – 2023-04-30 (×3): 25 mg via ORAL
  Filled 2023-04-24 (×3): qty 1

## 2023-04-24 MED ORDER — APIXABAN 5 MG PO TABS
5.0000 mg | ORAL_TABLET | Freq: Two times a day (BID) | ORAL | Status: DC
  Administered 2023-04-24 – 2023-05-03 (×18): 5 mg via ORAL
  Filled 2023-04-24 (×18): qty 1

## 2023-04-24 MED ORDER — SODIUM CHLORIDE 0.9 % IV SOLN
INTRAVENOUS | Status: DC
Start: 2023-04-24 — End: 2023-04-26
  Administered 2023-04-26: 75 mL/h via INTRAVENOUS

## 2023-04-24 MED ORDER — TRAZODONE HCL 50 MG PO TABS
25.0000 mg | ORAL_TABLET | Freq: Every day | ORAL | Status: DC
Start: 2023-04-24 — End: 2023-05-03
  Administered 2023-04-24 – 2023-05-02 (×9): 25 mg via ORAL
  Filled 2023-04-24 (×9): qty 1

## 2023-04-24 MED ORDER — ACETAMINOPHEN 325 MG PO TABS
650.0000 mg | ORAL_TABLET | Freq: Four times a day (QID) | ORAL | Status: DC
  Administered 2023-04-24 – 2023-05-03 (×35): 650 mg via ORAL
  Filled 2023-04-24 (×36): qty 2

## 2023-04-24 NOTE — Progress Notes (Signed)
Pt c/o  right lower side abdominal discomfort at hs. PRN given and effective. Gayland Curry   RN

## 2023-04-24 NOTE — Progress Notes (Signed)
 Behavior Plan     04/24/23 0700  Behavioral Plan Guideline  Rancho Level  (no TBI, Rancho Level N/A)  Behavior to decrease/eliminate Increase arousal;Increase participation;Improve pain;Decrease impulsivity;Decrease restlessness;Decrease impulsivity with toileting;Improved adherence to precautions  Improved adherence to precautions RLE  Changes to environment Move patient closer to nursing station;Door open when staff not in room;Within arms reach during toileting;Play preferred music  Interventions Telesitter;Mobilize on unit with staff or family (w/c level or ambulatory if appropriate)  Recommendations for interactions with patient Offer patient preferred music  In Attendance at Behavior Plan Meeting  OT;PT;SLP;RN   Candy Ziegler, M.A., CCC-SLP

## 2023-04-24 NOTE — Progress Notes (Signed)
Followed up with tele-sitter services, monitor is not available this time. Patient remains on the waiting list.    [name] Dome, LPN

## 2023-04-24 NOTE — Progress Notes (Signed)
 Physical Therapy Session Note  Patient Details  Name: Meah Jiron MRN: [id number] Date of Birth: [birth year]  Today's Date: 04/24/2023 PT Missed Time: 45 Minutes Missed Time Reason: CT/MRI  Short Term Goals: Week 1:  PT Short Term Goal 1 (Week 1): Pt will perform bed mobility from flat bed surface and no rails with Mod I. PT Short Term Goal 2 (Week 1): Pt will perform functional transfers with no AD and supervision. PT Short Term Goal 3 (Week 1): Pt will ambulate short, household distances with no AD and close supervision. PT Short Term Goal 4 (Week 1): Pt will ambulate community distances using rollator with supervision. PT Short Term Goal 5 (Week 1): Pt wil complete outcome measure.  Skilled Therapeutic Interventions/Progress Updates:    Pt missed 45 minutes 2/2 CT scan, will attempt to make up missed minutes as able.  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

## 2023-04-24 NOTE — Plan of Care (Signed)
  Problem: RH Balance Goal: LTG Patient will maintain dynamic standing balance (PT) Description: LTG:  Patient will maintain dynamic standing balance with assistance during mobility activities (PT) Flowsheets (Taken 04/21/2023 1758) LTG: Pt will maintain dynamic standing balance during mobility activities with:: Independent   Problem: Sit to Stand Goal: LTG:  Patient will perform sit to stand with assistance level (PT) Description: LTG:  Patient will perform sit to stand with assistance level (PT) Flowsheets (Taken 04/21/2023 1758) LTG: PT will perform sit to stand in preparation for functional mobility with assistance level: Independent   Problem: RH Bed Mobility Goal: LTG Patient will perform bed mobility with assist (PT) Description: LTG: Patient will perform bed mobility with assistance, with/without cues (PT). Flowsheets (Taken 04/21/2023 1758) LTG: Pt will perform bed mobility with assistance level of: Independent   Problem: RH Bed to Chair Transfers Goal: LTG Patient will perform bed/chair transfers w/assist (PT) Description: LTG: Patient will perform bed to chair transfers with assistance (PT). Flowsheets (Taken 04/21/2023 1758) LTG: Pt will perform Bed to Chair Transfers with assistance level: Independent   Problem: RH Furniture Transfers Goal: LTG Patient will perform furniture transfers w/assist (OT/PT) Description: LTG: Patient will perform furniture transfers  with assistance (OT/PT). Flowsheets (Taken 04/21/2023 1758) LTG: Pt will perform furniture transfers with assist:: Independent   Problem: RH Ambulation Goal: LTG Patient will ambulate in controlled environment (PT) Description: LTG: Patient will ambulate in a controlled environment, # of feet with assistance (PT). Flowsheets (Taken 04/21/2023 1758) LTG: Pt will ambulate in controlled environ  assist needed:: Supervision/Verbal cueing LTG: Ambulation distance in controlled environment: >250 ft using LRAD Goal: LTG Patient  will ambulate in home environment (PT) Description: LTG: Patient will ambulate in home environment, # of feet with assistance (PT). Flowsheets (Taken 04/21/2023 1758) LTG: Pt will ambulate in home environ  assist needed:: Supervision/Verbal cueing LTG: Ambulation distance in home environment: at least 50 ft using LRAD   Problem: RH Stairs Goal: LTG Patient will ambulate up and down stairs w/assist (PT) Description: LTG: Patient will ambulate up and down # of stairs with assistance (PT) Flowsheets (Taken 04/21/2023 0758) LTG: Pt will ambulate up/down stairs assist needed:: Supervision/Verbal cueing LTG: Pt will  ambulate up and down number of stairs: at lesat 4 steps using HR as necessary to ambulate in community

## 2023-04-24 NOTE — Progress Notes (Addendum)
 Patient got up by herself and toileted herself, found by tech. upon arrival patient was crying, provided emotional support. Patient up to chair, patient passed out in the chair, became unresponsive. Staff assisted patient back into bed. Patient then became responsive to pain. Rapid response contacted, MD and PA aware. MD and PA made face to face visit. New orders place.     Geni Armor, LPN

## 2023-04-24 NOTE — Progress Notes (Signed)
 Speech Language Pathology Daily Session Note  Patient Details  Name: Brittany [name] MRN: [id number] Date of Birth: 1965-10-19  Today's Date: 04/24/2023 SLP Individual Time: 9180-9145 SLP Individual Time Calculation (min): 35 min  Short Term Goals: Week 1: SLP Short Term Goal 1 (Week 1): Patient will answer basic environmental and biographical yes/no questions with 90% accuracy given max assist. SLP Short Term Goal 2 (Week 1): Patient will follow 1-step directions with 90% accuracy given max assist. SLP Short Term Goal 3 (Week 1): Patient will verbalize trained word-level functional utterances in 8/10 opportunities given max assist.  Skilled Therapeutic Interventions: SLP conducted skilled therapy session targeting communication goals. Upon SLP entry, patient shaking/crying due to severe pain. Patient demonstrated limited ability to communicate wants/needs but gestured to pain location with min cues. SLP alerted nursing staff to pain and LPN administered pain medication and provided ice pack. For remainder of session, SLP targeted functional communication of wants/needs and elicitation of automatic speech. During music activity, patient unable to sing along to familiar songs this date, though this was a successful technique in previous sessions. Pain was largely limiting, but patient answered yes/no questions regarding preferences about music, positioning, and bedding. Session truncated by 10 minutes due to severe pain and decreased participation. Patient was left in lowered bed with call bell in reach and bed alarm set. SLP will continue to target goals per plan of care.       Pain Pain Assessment Pain Scale: Faces Faces Pain Scale: Hurts worst Pain Type: Acute pain Pain Location: Groin Pain Orientation: Right Pain Intervention(s): RN made aware;Repositioned;Distraction;Music;Rest  Therapy/Group: Individual Therapy  Brittany [name] A Brittany [name] 04/24/2023, 11:02 AM

## 2023-04-24 NOTE — Progress Notes (Signed)
 Physical Therapy Note  Patient Details  Name: Brittany [name] MRN: [id number] Date of Birth: May 01, 1965 Today's Date: 04/24/2023    PT attempted therapy session, but pt not responsive except for 1 head nod to question, which seemed appropriate.  PT attempted tactile stimuli but pt not responding.  Nursing notified and came to room.  Pt responsive to sternal rub, but then resumes sleep.  Pt not participating w/ therapy and missed 30' of skilled therapy.     Zalan Shidler P Cherese Lozano 04/24/2023, 11:13 AM

## 2023-04-24 NOTE — Progress Notes (Signed)
 Occupational Therapy Session Note  Patient Details  Name: Brittany [name] MRN: [id number] Date of Birth: March 01, 1966  Today's Date: 04/24/2023 OT Individual Time: 0920-0950 OT Individual Time Calculation (min): 30 min  and Today's Date: 04/24/2023 OT Missed Time: 10 Minutes Missed Time Reason: Patient unwilling/refused to participate without medical reason;Patient fatigue  Today's Date: 04/24/2023 OT Missed Time: 30 Minutes Missed Time Reason: CT/MRI  Short Term Goals: Week 1:  OT Short Term Goal 1 (Week 1): STGs=LTGs due to patient's estimated length of stay.  Skilled Therapeutic Interventions/Progress Updates:   Session 1: Pt received resting in bed, wincing and writhing in observable pain. Rest/positioning provided for comfort. OT attempting to arouse patient with cool wash-cloth, no improvement in level of alertness or attention to therapist. Pt only responding (opening eyes) when sternal rub preformed. Treatment team updated on continued presentation, proposed adjustment of therapy schedule, no decision made at end of session. Pt remained resting in bed, door open, 4 rails up, LPN made aware. ~10 mins of intervention, to be made up as appropriate.   Session 2: Pt transported off of unit for stat CT scan, missing ~30 mins of skilled intervention. Plan to make up time as appropriate.  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

## 2023-04-24 NOTE — Plan of Care (Signed)
  Problem: RH Car Transfers Goal: LTG Patient will perform car transfers with assist (PT) Description: LTG: Patient will perform car transfers with assistance (PT). Flowsheets (Taken 04/21/2023 1603) LTG: Pt will perform car transfers with assist:: Supervision/Verbal cueing

## 2023-04-24 NOTE — Progress Notes (Addendum)
Patient touching right groin groin and grimacing in pain. Medications/ non-pharmacological intervention implemented. Also, patient displaying emotional changes (crys intermittently)  Music therapy in place.   [name] Dome, LPN

## 2023-04-24 NOTE — Progress Notes (Signed)
Camera was discontinued due to hardware issues.   Dominica Severin   RN

## 2023-04-24 NOTE — Progress Notes (Addendum)
Tele-monitor in place Tele-monitor working on camera, camera not working currently   [employer], [employer]

## 2023-04-24 NOTE — Plan of Care (Signed)
  Problem: RH BOWEL ELIMINATION Goal: RH STG MANAGE BOWEL WITH ASSISTANCE Description: STG Manage Bowel with  MOD I Assistance. Outcome: Progressing   Problem: RH BLADDER ELIMINATION Goal: RH STG MANAGE BLADDER WITH ASSISTANCE Description: STG Manage Bladder with Mod I Assistance Outcome: Progressing   Problem: RH PAIN MANAGEMENT Goal: RH STG PAIN MANAGED AT OR BELOW PT'S PAIN GOAL Description: <4 w/ prns Outcome: Progressing

## 2023-04-24 NOTE — Progress Notes (Signed)
Patient responding to pain, awaking, and then going back to sleep. MD aware   [name] Dome, LPN

## 2023-04-24 NOTE — Progress Notes (Signed)
 PROGRESS NOTE   Subjective/Complaints:  This morning initially patient moaning laying in her bed.  Indicating having pain around her hematoma site.  When observed from the hallway a few minutes later she appeared to be calm with her eyes open.  Poor participation with therapy noted by staff noted to be drowsy, although also reported that she tried to get out of bed at 1 point and needed staff to get her back into bed.  ROS: limited due to aphasia.   Objective:   VAS US  GROIN PSEUDOANEURYSM Result Date: 04/23/2023  ARTERIAL PSEUDOANEURYSM  Patient Name:  JERLINE LINZY  Date of Exam:   04/22/2023 Medical Rec #: [id number]              Accession #:    [id number] Date of Birth: [birth year]              Patient Gender: F Patient Age:   58 years Exam Location:  [facility] Procedure:      VAS US  QUILLIAN CAPES Referring Phys: DELON BEAGLE --------------------------------------------------------------------------------  Exam: Right groin Indications: Patient complains of groin pain and bruising. History: S/p arteriogram 04/16/23. Limitations: Edema, pain with pressure Comparison Study: No prior study Performing Technologist: Alberta Lis RVS  Examination Guidelines: A complete evaluation includes B-mode imaging, spectral Doppler, color Doppler, and power Doppler as needed of all accessible portions of each vessel. Bilateral testing is considered an integral part of a complete examination. Limited examinations for reoccurring indications may be performed as noted.  Summary: No evidence of pseudoaneurysm or AVF. There is a small hematoma noted as well as a lymph node at area of concern. The partial CFV DVT found 04/20/2023 appears to be resolving.  Diagnosing physician: Lonni Gaskins MD Electronically signed by Lonni Gaskins MD on 04/23/2023 at 11:44:41 AM.    --------------------------------------------------------------------------------    Final    Recent Labs    04/22/23 0202 04/23/23 0555  WBC 4.1 4.6  HGB 13.2 13.4  HCT 39.7 39.7  PLT 188 200   Recent Labs    04/23/23 0555  NA 135  K 4.6  CL 102  CO2 22  GLUCOSE 84  BUN 17  CREATININE 0.97  CALCIUM  8.9         Intake/Output Summary (Last 24 hours) at 04/24/2023 1336 Last data filed at 04/23/2023 1800 Gross per 24 hour  Intake 50 ml  Output --  Net 50 ml        Physical Exam: Vital Signs Blood pressure (!) 147/74, pulse (!) 101, temperature 98.1 F (36.7 C), temperature source Oral, resp. rate 18, height 5' 2 (1.575 m), weight 67.4 kg, SpO2 98%.  Constitutional: Laying in bed, moaning initially when I was in the room, appeared fairly comfortable when observed from the hallway HENT:   Atraumatic, normocephalic. Eyes: PERRLA. EOM seem intact. No notable nystagmus.  Cardiovascular: RRR, no murmurs/rub/gallops. No Edema. Peripheral pulses 2+  Respiratory: CTAB. No rales, rhonchi, or wheezing. On RA.  Abdomen: soft, + bowel sounds, more normoactive today. Nondistended today; no areas of tenderness to the abdomen today.  Skin: C/D/I. No apparent lesions. Extremities: R groin with large ?hematoma in the inguinal canal area,  not indurated but firm, somewhat rubbery, mildly tender, no overlying erythema or warmth, scattered bruising to this area and further proximally approaching the anterolateral thigh where there is a larger bruise.  Bruise does not appear any larger based on marker lines on her skin from prior day.  She has an ice pack over this area. Neuro: Awake, moaning.  A few minutes later she did have her eyes open and I came in the room she was able to point to her hematoma when asked about pain.  Continued aphasia appears more expressive although difficult to assess.    PRIOR EXAMS: MSK:      No apparent deformity. No joint pain on ROM.        Neurologic exam:   Cognition: Awake, alert. Responds to 1/3 verbal commands,  2/3 commands with physical cues.  + Severe R hemineglect Language: Global aphasia, expressive > receptive. Anomic. Dysarthric.  Insight: Poor insight into current condition.  Mood: Anxious,  but appropriate and interactive.  Sensation: To light touch reduced on RLE Reflexes: Hyporeflexic RLE, 2+ RUE; 1+ LUE and LLE. Negative Hoffman's and babinski signs bilaterally.  CN: + Mild facial droop  Coordination: No apparent tremors.   Spasticity: MAS 1-2 R elbow flexors, 1 elbow extensors, 1 wrist flexors      Strength: 5/5 LUE and LLE;       R side difficult to assess due to neglect.  RUE 1/5 grip, elbow flexion c/b motor apraxia RLE   3/5 x1 with R hip flexor and knee flexor; no ankle movement.     Assessment/Plan: 1. Functional deficits which require 3+ hours per day of interdisciplinary therapy in a comprehensive inpatient rehab setting. Physiatrist is providing close team supervision and 24 hour management of active medical problems listed below. Physiatrist and rehab team continue to assess barriers to discharge/monitor patient progress toward functional and medical goals  Care Tool:  Bathing    Body parts bathed by patient: Right arm, Left arm, Chest, Abdomen, Front perineal area, Buttocks, Right upper leg, Left upper leg, Right lower leg, Left lower leg, Face         Bathing assist Assist Level: Contact Guard/Touching assist     Upper Body Dressing/Undressing Upper body dressing   What is the patient wearing?: Hospital gown only    Upper body assist Assist Level: Minimal Assistance - Patient > 75%    Lower Body Dressing/Undressing Lower body dressing      What is the patient wearing?: Underwear/pull up, Pants     Lower body assist Assist for lower body dressing: Minimal Assistance - Patient > 75%     Toileting Toileting    Toileting assist Assist for toileting: Contact Guard/Touching assist      Transfers Chair/bed transfer  Transfers assist     Chair/bed transfer assist level: Minimal Assistance - Patient > 75%     Locomotion Ambulation   Ambulation assist      Assist level: Minimal Assistance - Patient > 75% Assistive device: Hand held assist (HHA or using IV pole) Max distance: 75 ft   Walk 10 feet activity   Assist     Assist level: Contact Guard/Touching assist Assistive device: Other (comment) (IV pole)   Walk 50 feet activity   Assist    Assist level: Minimal Assistance - Patient > 75% Assistive device: Other (comment) (IV pole)    Walk 150 feet activity   Assist Walk 150 feet activity did not occur: Safety/medical concerns  Walk 10 feet on uneven surface  activity   Assist Walk 10 feet on uneven surfaces activity did not occur: Safety/medical concerns         Wheelchair     Assist Is the patient using a wheelchair?: Yes Type of Wheelchair: Manual    Wheelchair assist level: Dependent - Patient 0% Max wheelchair distance: 250    Wheelchair 50 feet with 2 turns activity    Assist        Assist Level: Dependent - Patient 0%   Wheelchair 150 feet activity     Assist      Assist Level: Dependent - Patient 0%   Blood pressure (!) 147/74, pulse (!) 101, temperature 98.1 F (36.7 C), temperature source Oral, resp. rate 18, height 5' 2 (1.575 m), weight 67.4 kg, SpO2 98%.  Medical Problem List and Plan: 1. Functional deficits secondary to left MCA infarct due to severe left MCA stenosis; notable Hx prior L cerebellar CVAs             -patient may not shower while on continuous heparin  drip             -ELOS/Goals: 10 days, SPV PT/OT, Min A SLP             -04/21/23 CIR evals today; ordered SLP eval since it wasn't on there  -Continue CIR  -Consider repeat CT if mentation noted to be persistently worse, possibly behavioral component contributing also 2.  R-CFV DVT (found  1/31)/Antithrombotics: -DVT/anticoagulation:  Pharmaceutical: Heparin  low dose X 48 hours and if stable transition to DOAC              -antiplatelet therapy: Brillinta. ASA d/c due to addition of Heparin .  - Per lit review, early mobilization current recommendation, without gross restrictions; no convincing evidence of increased PE risk.   -04/22/23 long discussion with pt and brother regarding clot location, this coincides with area of pain, large palpable ?hematoma to R groin; had mechanical thrombectomy-- called IR Dr. Juliene Balder who relayed to the team, Delon Beagle NP will come see the patient, appreciate their assistance with this case. For now, added Tramadol  100mg  q8h PRN to help with pain.  -04/23/23 Vas U/S with hematoma/continued DVT-smaller, Pt seen by IR this AM - recommend avoiding bending or stooping, limited mobility- transfers only for 1-2 days.  Ok to transition to eliquis   -2/4 I am told IR coming back today for reevaluation   3. Pain Management:  Tylenol  prn. Gabapentin  300mg  TID -04/22/23 added Tramadol  100mg  q8h PRN for pain in R groin (see above)-- pt allergic/intolerant to other hydro/oxycodone .  -2/4 scheduled Tylenol  4. Mood/Behavior/Sleep: LCSW to follow for evaluation and support.              -antipsychotic agents: N/A -04/21/23 didn't sleep well, will add melatonin 5mg  PRN, has trazodone  PRN as well -04/23/23 will discontinue tramadol , Benadryl  as needed, DC melatonin, DC hydrocodone  5. Neuropsych/cognition: This patient is not capable of making decisions on her own behalf. 6. Skin/Wound Care: Routine pressure relief measures.  7. Fluids/Electrolytes/Nutrition: Monitor I/O. Check routine labs -04/21/23 Cr 1.19 a little up from yesterday but down from prior, monitor; K 3.3, see below; remainder of CMP unremarkable, monitor labs 8. HTN: Monitor BP TID--continue amlodipine  10mg  daily and hydralazine  50mg  q8h             --avoid hypoperfusion.   -2/4 intermittently elevated,  continue to monitor trend for now.  Avoid hypotension Vitals:   04/23/23  [id number] 04/23/23 0818 04/23/23 0839 04/23/23 0931  BP: (!) 156/74 (!) 162/84 (!) 162/81 (!) 145/80   04/23/23 1324 04/23/23 1531 04/23/23 1943 04/23/23 2047  BP: (!) 147/61 (!) 147/61 121/79 121/79   04/24/23 0524 04/24/23 0605 04/24/23 1117 04/24/23 1232  BP: 135/72 135/72 (!) 157/71 (!) 147/74    9. RLQ pain:  Felt to be due to IBS. Has been on miralax  daily and Senna S 2 tabs nightly without results --Linzess  72mcg every day was started today. Will get KUB  -04/21/23 KUB with gas filled colon but otherwise neg/nonobstructive; reviewed images myself, suspect stool burden contributory to gas trapped in R side; will increase miralax /colace to BID, leave senna S as is, add sorbitol  30ml PRN if no BM today; has PRN dulcolax supp and fleet if needed too; monitor closely -04/22/23 pt insists she's had BMs daily, but no documentation; no abd pain today, appears it's R groin pain-- see above #2-- tramadol  added, leave stool softeners in place, NEED DOCUMENTATION OF STOOLS -2/3 LBM yesterday 10. Hypokalemia: Supplement added X 2 days.  -04/21/23 K 3.3, potassium supplement not ordered, 40mEq daily x2 days added today -stable today 11. Hyperlipidemia: LDL-124 on Lipitor  80 mg and Zetia  10mg  daily.  12. Pre-diabetes: Hgb A1c- 5.9 and every 4 hours CBS have shown BS controlled. --Will monitor fasting BS on serial labs.  13. Substance abuse: Educated on stroke risk w/marijuana.  14. H/o MM: Acute renal failure improving SCr 1.11 @ admission-->1.59-->1.07  -04/21/23 Cr 1.19, monitor  -2/3 Cr improved to 0.97  Recheck labs, start IVF NS 75ml/hr due to poor intake  15. GERD: On protonix  40mg  daily     LOS: 4 days A FACE TO FACE EVALUATION WAS PERFORMED  Murray Collier 04/24/2023, 1:36 PM

## 2023-04-24 NOTE — Progress Notes (Signed)
PA aware of CT scan results.    [name] Dome, LPN

## 2023-04-25 ENCOUNTER — Inpatient Hospital Stay (HOSPITAL_COMMUNITY): Payer: Medicaid Other

## 2023-04-25 DIAGNOSIS — I63512 Cerebral infarction due to unspecified occlusion or stenosis of left middle cerebral artery: Secondary | ICD-10-CM | POA: Diagnosis not present

## 2023-04-25 DIAGNOSIS — I10 Essential (primary) hypertension: Secondary | ICD-10-CM | POA: Diagnosis not present

## 2023-04-25 DIAGNOSIS — N179 Acute kidney failure, unspecified: Secondary | ICD-10-CM | POA: Diagnosis not present

## 2023-04-25 DIAGNOSIS — I82411 Acute embolism and thrombosis of right femoral vein: Secondary | ICD-10-CM | POA: Diagnosis not present

## 2023-04-25 DIAGNOSIS — E876 Hypokalemia: Secondary | ICD-10-CM | POA: Diagnosis not present

## 2023-04-25 DIAGNOSIS — R1031 Right lower quadrant pain: Secondary | ICD-10-CM | POA: Diagnosis not present

## 2023-04-25 LAB — BASIC METABOLIC PANEL
Anion gap: 9 (ref 5–15)
BUN: 16 mg/dL (ref 6–20)
CO2: 19 mmol/L — ABNORMAL LOW (ref 22–32)
Calcium: 8.5 mg/dL — ABNORMAL LOW (ref 8.9–10.3)
Chloride: 109 mmol/L (ref 98–111)
Creatinine, Ser: 1.09 mg/dL — ABNORMAL HIGH (ref 0.44–1.00)
GFR, Estimated: 59 mL/min — ABNORMAL LOW (ref >60)
Glucose, Bld: 94 mg/dL (ref 70–99)
Potassium: 3.7 mmol/L (ref 3.5–5.1)
Sodium: 137 mmol/L (ref 135–145)

## 2023-04-25 LAB — PLATELET INHIBITION P2Y12: Platelet Function  P2Y12: 1 [PRU] — ABNORMAL LOW (ref 182–335)

## 2023-04-25 MED ORDER — TICAGRELOR 60 MG PO TABS
60.0000 mg | ORAL_TABLET | Freq: Two times a day (BID) | ORAL | Status: DC
  Administered 2023-04-25 – 2023-04-27 (×4): 60 mg via ORAL
  Filled 2023-04-25 (×5): qty 1

## 2023-04-25 NOTE — Progress Notes (Signed)
 Rested well with no overnight issue except getting out of bed without using call light. Appears to be impulsive.  Bertram Brocks   RN

## 2023-04-25 NOTE — Progress Notes (Signed)
 PROGRESS NOTE   Subjective/Complaints:  Alert this AM, participating with therapy.  Repeat CT head today per neuro scheduled.   ROS: limited due to aphasia.   Objective:   CT ABDOMEN PELVIS WO CONTRAST Result Date: 04/24/2023 CLINICAL DATA:  Abdominal pain, acute, nonlocalized. EXAM: CT ABDOMEN AND PELVIS WITHOUT CONTRAST TECHNIQUE: Multidetector CT imaging of the abdomen and pelvis was performed following the standard protocol without IV contrast. RADIATION DOSE REDUCTION: This exam was performed according to the departmental dose-optimization program which includes automated exposure control, adjustment of the mA and/or kV according to patient size and/or use of iterative reconstruction technique. COMPARISON:  Abdominal radiography 04/20/2023.  CT 05/30/2018. FINDINGS: Lower chest: Mild atelectasis in the right middle lobe. No pleural effusion. Hepatobiliary: Liver parenchyma appears normal without contrast. No calcified gallstones. Pancreas: Normal Spleen: Normal Adrenals/Urinary Tract: Adrenal glands are normal. Right kidney is normal. Left kidney is normal except for a simple appearing cyst, maximal dimension 5.7 cm, enlarged since 2020. No hydronephrosis. Bladder is normal. Stomach/Bowel: Stomach and small intestine are normal. Appendix is normal. Moderate amount of stool and fluid in the colon without evidence of frank obstruction. No finding to suggest colitis or diverticulitis. Vascular/Lymphatic: Aortic atherosclerosis. No aneurysm. IVC is normal. No adenopathy. Edematous stranding in the right groin, presumably subsequent to the previous vascular access. No evidence of well-defined focal collection. Reproductive: No pelvic mass. Other: No free fluid or air. Musculoskeletal: Ordinary mild lumbar degenerative changes. IMPRESSION: 1. No acute finding by CT. Moderate amount of stool and fluid in the colon without evidence of frank  obstruction. No finding to suggest colitis or diverticulitis. 2. Aortic atherosclerosis. 3. Edematous stranding in the right groin, presumably subsequent to the previous vascular access. No evidence of well-defined focal collection. Electronically Signed   By: Oneil Officer M.D.   On: 04/24/2023 15:44   CT HEAD WO CONTRAST ( ) Result Date: 04/24/2023 CLINICAL DATA:  Stroke.  Follow-up. EXAM: CT HEAD WITHOUT CONTRAST TECHNIQUE: Contiguous axial images were obtained from the base of the skull through the vertex without intravenous contrast. RADIATION DOSE REDUCTION: This exam was performed according to the departmental dose-optimization program which includes automated exposure control, adjustment of the mA and/or kV according to patient size and/or use of iterative reconstruction technique. COMPARISON:  CT 04/16/2023 FINDINGS: Brain: No focal abnormality affects the brainstem. Old infarctions noted within the inferior cerebellum on the left. Previously placed left MCA vascular stent. Previously seen hemorrhagic infarction in the left insula, lateral basal ganglia and frontal operculum with expected regional low density. Central blood products remain visible and actually appear slightly more dense and better circumscribed, suggesting that there could be some additional or ongoing hemorrhage. Alternatively, this could be due to clot contraction. Previously, the hemorrhage was becoming less dense and less distinct. Other scattered acute infarctions in the left MCA territory continue to show low-density but no hemorrhage in those locations. No evidence of a new regional stroke. Mild mass effect but no midline shift. No subdural or subarachnoid bleeding. Vascular: No other vascular finding. Skull: Negative Sinuses/Orbits: Clear/normal Other: None IMPRESSION: 1. Previously seen hemorrhagic infarction in the left insula, lateral basal ganglia and frontal operculum with expected regional  low density. Central blood  products remain visible and actually appear slightly more dense and better circumscribed, suggesting that there could be some additional or ongoing hemorrhage. Previously, the hemorrhage was becoming less dense and less distinct. However, an additional explanation could be that of clot contraction. 2. Other scattered acute infarctions in the left MCA territory continue to show low-density but no hemorrhage in those locations. 3. No evidence of a new regional stroke. Electronically Signed   By: Oneil Officer M.D.   On: 04/24/2023 15:41   Recent Labs    04/23/23 0555 04/24/23 1414  WBC 4.6 4.4  HGB 13.4 13.8  HCT 39.7 40.6  PLT 200 186   Recent Labs    04/23/23 0555 04/24/23 1414  NA 135 136  K 4.6 3.6  CL 102 103  CO2 22 21*  GLUCOSE 84 99  BUN 17 17  CREATININE 0.97 1.14*  CALCIUM  8.9 9.1         Intake/Output Summary (Last 24 hours) at 04/25/2023 1203 Last data filed at 04/25/2023 0907 Gross per 24 hour  Intake 75 ml  Output --  Net 75 ml        Physical Exam: Vital Signs Blood pressure (!) 147/80, pulse 89, temperature 98 F (36.7 C), resp. rate 18, height 5' 2 (1.575 m), weight 67.4 kg, SpO2 98%.  Constitutional: Laying in bed, Alert and wake, NAD HENT:   Atraumatic, normocephalic. Eyes: PERRLA. EOM seem intact. No notable nystagmus.  Cardiovascular: RRR, no murmurs/rub/gallops. No Edema. Peripheral pulses 2+  Respiratory: CTAB. No rales, rhonchi, or wheezing. On RA.  Abdomen: soft, + bowel sounds, more normoactive today. Nondistended today; no areas of tenderness to the abdomen today.  Skin: C/D/I. No apparent lesions. Extremities: R groin with large ?hematoma in the inguinal canal area, not indurated but firm, somewhat rubbery, mildly tender, no overlying erythema or warmth, scattered bruising to this area and further proximally approaching the anterolateral thigh where there is a larger bruise.  Bruise does not appear any larger based on marker lines on her skin  from prior day.  She has an ice pack over this area. Neuro: Alert awake, occasional following yes/no and occasional following commands. Continued aphasia appears more expressive although difficult to assess.    PRIOR EXAMS: MSK:      No apparent deformity. No joint pain on ROM.        Neurologic exam:  Cognition: Awake, alert. Responds to 1/3 verbal commands,  2/3 commands with physical cues.  + Severe R hemineglect Language: Global aphasia, expressive > receptive. Anomic. Dysarthric.  Insight: Poor insight into current condition.  Mood: Anxious,  but appropriate and interactive.  Sensation: To light touch reduced on RLE Reflexes: Hyporeflexic RLE, 2+ RUE; 1+ LUE and LLE. Negative Hoffman's and babinski signs bilaterally.  CN: + Mild facial droop  Coordination: No apparent tremors.   Spasticity: MAS 1-2 R elbow flexors, 1 elbow extensors, 1 wrist flexors      Strength: 5/5 LUE and LLE;       R side difficult to assess due to neglect.  RUE 1/5 grip, elbow flexion c/b motor apraxia RLE   3/5 x1 with R hip flexor and knee flexor; no ankle movement.     Assessment/Plan: 1. Functional deficits which require 3+ hours per day of interdisciplinary therapy in a comprehensive inpatient rehab setting. Physiatrist is providing close team supervision and 24 hour management of active medical problems listed below. Physiatrist and rehab team continue to assess barriers  to discharge/monitor patient progress toward functional and medical goals  Care Tool:  Bathing    Body parts bathed by patient: Right arm, Left arm, Chest, Abdomen, Front perineal area, Buttocks, Right upper leg, Left upper leg, Right lower leg, Left lower leg, Face         Bathing assist Assist Level: Contact Guard/Touching assist     Upper Body Dressing/Undressing Upper body dressing   What is the patient wearing?: Hospital gown only    Upper body assist Assist Level: Minimal Assistance - Patient > 75%    Lower Body  Dressing/Undressing Lower body dressing      What is the patient wearing?: Underwear/pull up, Pants     Lower body assist Assist for lower body dressing: Minimal Assistance - Patient > 75%     Toileting Toileting    Toileting assist Assist for toileting: Contact Guard/Touching assist     Transfers Chair/bed transfer  Transfers assist     Chair/bed transfer assist level: Minimal Assistance - Patient > 75%     Locomotion Ambulation   Ambulation assist      Assist level: Minimal Assistance - Patient > 75% Assistive device: Hand held assist (HHA or using IV pole) Max distance: 75 ft   Walk 10 feet activity   Assist     Assist level: Contact Guard/Touching assist Assistive device: Other (comment) (IV pole)   Walk 50 feet activity   Assist    Assist level: Minimal Assistance - Patient > 75% Assistive device: Other (comment) (IV pole)    Walk 150 feet activity   Assist Walk 150 feet activity did not occur: Safety/medical concerns         Walk 10 feet on uneven surface  activity   Assist Walk 10 feet on uneven surfaces activity did not occur: Safety/medical concerns         Wheelchair     Assist Is the patient using a wheelchair?: Yes Type of Wheelchair: Manual    Wheelchair assist level: Dependent - Patient 0% Max wheelchair distance: 250    Wheelchair 50 feet with 2 turns activity    Assist        Assist Level: Dependent - Patient 0%   Wheelchair 150 feet activity     Assist      Assist Level: Dependent - Patient 0%   Blood pressure (!) 147/80, pulse 89, temperature 98 F (36.7 C), resp. rate 18, height 5' 2 (1.575 m), weight 67.4 kg, SpO2 98%.  Medical Problem List and Plan: 1. 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  R-CFV DVT (found 1/31)/Antithrombotics: -DVT/anticoagulation:  Pharmaceutical: Heparin  low dose X 48 hours and if stable transition to DOAC              -antiplatelet therapy: Brillinta. ASA d/c due to addition of Heparin .  - Per lit review, early mobilization current recommendation, without gross restrictions;  no convincing evidence of increased PE risk.   -04/22/23 long discussion with pt and brother regarding clot location, this coincides with area of pain, large palpable ?hematoma to R groin; had mechanical thrombectomy-- called IR Dr. Juliene Balder who relayed to the team, Delon Beagle NP will come see the patient, appreciate their assistance with this case. For now, added Tramadol  100mg  q8h PRN to help with pain.  -04/23/23 Vas U/S with hematoma/continued DVT-smaller, Pt seen by IR this AM - recommend avoiding bending or stooping, limited mobility- transfers only for 1-2 days.  Ok to transition to eliquis   -2/4 I am told IR coming back today for reevaluation   3. Pain Management:  Tylenol  prn. Gabapentin  300mg  TID -04/22/23 added Tramadol  100mg  q8h PRN for pain in R groin (see above)-- pt allergic/intolerant to other hydro/oxycodone .  -2/4 scheduled Tylenol  -2/5 appears much less uncomfortable today, continue current 4. Mood/Behavior/Sleep: LCSW to follow for evaluation and support.              -antipsychotic agents: N/A -04/21/23 didn't sleep well, will add melatonin 5mg  PRN, has trazodone  PRN as well -04/23/23 will discontinue tramadol , Benadryl  as needed, DC melatonin, DC hydrocodone  -2/5 trazodone  25mg  hs started yesterday, seroquel  PRN was started 5. Neuropsych/cognition: This patient is not capable of making decisions on her own behalf. 6. Skin/Wound Care: Routine  pressure relief measures.  7. Fluids/Electrolytes/Nutrition: Monitor I/O. Check routine labs -04/21/23 Cr 1.19 a little up from yesterday but down from prior, monitor; K 3.3, see below; remainder of CMP unremarkable, monitor labs 8. HTN: Monitor BP TID--continue amlodipine  10mg  daily and hydralazine  50mg  q8h             --avoid hypoperfusion.   -2/4 intermittently elevated, continue to monitor trend for now.  Avoid hypotension Vitals:   04/23/23 1531 04/23/23 1943 04/23/23 2047 04/24/23 0524  BP: (!) 147/61 121/79 121/79 135/72   04/24/23 0605 04/24/23 1117 04/24/23 1232 04/24/23 1340  BP: 135/72 (!) 157/71 (!) 147/74 (!) 140/90   04/24/23 1918 04/24/23 2149 04/25/23 0553 04/25/23 0557  BP: (!) 149/80 (!) 149/80 (!) 147/80 (!) 147/80    9. RLQ pain:  Felt to be due to IBS. Has been on miralax  daily and Senna S 2 tabs nightly without results --Linzess  72mcg every day was started today. Will get KUB  -04/21/23 KUB with gas filled colon but otherwise neg/nonobstructive; reviewed images myself, suspect stool burden contributory to gas trapped in R side; will increase miralax /colace to BID, leave senna S as is, add sorbitol  30ml PRN if no BM today; has PRN dulcolax supp and fleet if needed too; monitor closely -04/22/23 pt insists she's had BMs daily, but no documentation; no abd pain today, appears it's R groin pain-- see above #2-- tramadol  added, leave stool softeners in place, NEED DOCUMENTATION OF STOOLS -2/5 LBM today reported 10. Hypokalemia: Supplement added X 2 days.  -04/21/23 K 3.3, potassium supplement not ordered, 40mEq daily x2 days added today -recheck today 11. Hyperlipidemia: LDL-124 on Lipitor  80 mg and Zetia  10mg  daily.  12. Pre-diabetes: Hgb A1c- 5.9 and every 4 hours CBS have shown BS controlled. --Will monitor fasting BS on serial labs.  13. Substance abuse: Educated on stroke risk w/marijuana.  14. H/o MM: Acute renal failure improving SCr 1.11 @ admission-->1.59-->1.07  -04/21/23  Cr 1.19, monitor  -2/3 Cr improved to 0.97  Recheck labs, start IVF NS 75ml/hr due to poor intake   2/5 recheck labs today 15. GERD: On protonix  40mg  daily  LOS: 5 days A FACE TO FACE EVALUATION WAS PERFORMED  Murray Collier 04/25/2023, 12:03 PM

## 2023-04-25 NOTE — Plan of Care (Signed)
  Problem: RH BOWEL ELIMINATION Goal: RH STG MANAGE BOWEL WITH ASSISTANCE Description: STG Manage Bowel with  MOD I Assistance. Outcome: Progressing   Problem: RH BLADDER ELIMINATION Goal: RH STG MANAGE BLADDER WITH ASSISTANCE Description: STG Manage Bladder with Mod I Assistance Outcome: Progressing

## 2023-04-25 NOTE — Progress Notes (Signed)
 Occupational Therapy Session Note  Patient Details  Name: Brittany [name] MRN: [id number] Date of Birth: 1965/11/25  Today's Date: 04/25/2023 OT Individual Time: 8541-8478 OT Individual Time Calculation (min): 23 min  and Today's Date: 04/25/2023 OT Missed Time: 52 Minutes Missed Time Reason: Patient fatigue   Short Term Goals: Week 1:  OT Short Term Goal 1 (Week 1): STGs=LTGs due to patient's estimated length of stay.  Skilled Therapeutic Interventions/Progress Updates:  Skilled OT intervention completed with focus on activity tolerance, functional mobility and toileting needs. Pt received semi upright in bed with SLP present at direct care handoff. Pt remained globally aphasic, however with appropriate command following especially with yes/no answers about 70% of the time. Pt did not express or demonstrate pain.  OT offered toileting, with emphasis on getting OOB, however when OT prompted pt, pt extended BLE and started moaning and grabbing at her crotch. When OT inquired urgency pt shook head yes. Retrieved urinal for time, with pt able to assist with placement with mod A. With time, pt was continent of urinary void (<100 mL) and made moaning noises with the voiding. Pt denied burning with urination. Pt washed periareas with set up A and assisted OT with refastening brief.   Transitioned to EOB with supervision. Donned bilateral grip socks dependently. Pt declined other self-care needs but did point to outside doorway. Pt stood with CGA and R HHA, then appearing restless and anxious to mobilize, pt initiated and completed short distance ambulatory transfer with CGA with R HHA with +2 for w/c follow and IV management. Pt sat herself down despite cues to remain seated, with pt wiping sweat from her brow as if indicating fatigue. Pt pointed back to her room, and was unable to be redirected to other tasks. Transported dependently in w/c > room then again CGA sit > stand and short ambulatory  transfer > EOB with R HHA. Supervision sit > supine.   Pt remained semi supine in bed, with bed alarm on/activated, and with all needs in reach at end of session. Pt missed 52 mins of OT intervention secondary to fatigue; OT will make up missed time as able.    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), RLE DVT (no limitations according to H&P) Restrictions Weight Bearing Restrictions Per Provider Order: No    Therapy/Group: Individual Therapy  Lorrayne FORBES Fritter, MS, OTR/L  04/25/2023, 3:43 PM

## 2023-04-25 NOTE — Plan of Care (Signed)
  Problem: RH BOWEL ELIMINATION Goal: RH STG MANAGE BOWEL WITH ASSISTANCE Description: STG Manage Bowel with  MOD I Assistance. Outcome: Progressing Goal: RH STG MANAGE BOWEL W/MEDICATION W/ASSISTANCE Description: STG Manage Bowel with Medication with Assistance. Outcome: Progressing   Problem: RH BLADDER ELIMINATION Goal: RH STG MANAGE BLADDER WITH ASSISTANCE Description: STG Manage Bladder with Mod I Assistance Outcome: Progressing   Problem: RH SAFETY Goal: RH STG ADHERE TO SAFETY PRECAUTIONS W/ASSISTANCE/DEVICE Description: STG Adhere to Safety Precautions With Assistance/Device. Outcome: Progressing

## 2023-04-25 NOTE — Progress Notes (Signed)
 Bed alarm going off, upon arrival patient walking beside her bed. Re-oriented patient back, reinforced call light usage and communication board. Call bell within reach.     Randeen Busman, LPN

## 2023-04-25 NOTE — Progress Notes (Signed)
 Physical Therapy Session Note  Patient Details  Name: Brittany [name] MRN: [id number] Date of Birth: 1966-03-19  Today's Date: 04/25/2023 PT Individual Time: 0917-1000 PT Individual Time Calculation (min): 43 min   Short Term Goals: Week 1:  PT Short Term Goal 1 (Week 1): Pt will perform bed mobility from flat bed surface and no rails with Mod I. PT Short Term Goal 2 (Week 1): Pt will perform functional transfers with no AD and supervision. PT Short Term Goal 3 (Week 1): Pt will ambulate short, household distances with no AD and close supervision. PT Short Term Goal 4 (Week 1): Pt will ambulate community distances using rollator with supervision. PT Short Term Goal 5 (Week 1): Pt wil complete outcome measure.  Skilled Therapeutic Interventions/Progress Updates:      Pt awake supine in bed upon arrival. Pt agreeable to therapy. Pt denies any pain.  Pt smiling with compliments of pt bonnet, reponds my momma when asked who brought her the flowers and balloons in her room. Pt shaking head when asked if she is having any pain. Pt says okay when therapist says I need to move your IV before sitting EOB. Pt overall demos appropriate yes/no responses to questions, and follows 1 step commands for bed mobility/transfers.   Pt reaching inside pants and grasping at mouth, and shows therapist bowel on finger to indicate need to use bathroom.   Pt initially rolling on stomach and sliding feet onto floor with verbal cue to sit EOB with max verbal cues provided for redirection and safety. Supine to sit with mod A for trunk.   Sit to stand, stand pivot transfer RW and CGA bed <> BSC with therapist managing IV pole. Pt doffed pants with min A. Pt continent of bowel. Sit to supine with supervision with verbal cues for safety with IV.   While therapist cleaning up BSC, pt crying, and pointing at periarea to indicate incontinence. Pt performed rolling B with supervision with use of bed rails, verbal  cues provided for UE on rails versus reaching for therapist. Pt then able to raise legs, perform bridge, without an cues from therapist for initiation, for adequate clean up and donning brief.   Pt then indicates need to go more. Pt performed stand pivot transfer bed to [facility] without AD with min A, pt continent of bowel. Pt seated on BSC with tech at end of session.     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), RLE DVT (no limitations according to H&P) Restrictions Weight Bearing Restrictions Per Provider Order: No  Therapy/Group: Individual Therapy  [facility], [place], DPT  04/25/2023, 9:20 AM

## 2023-04-25 NOTE — Progress Notes (Signed)
 Speech Language Pathology Daily Session Note  Patient Details  Name: Rickayla Wieland MRN: [id number] Date of Birth: 1965/08/11  Today's Date: 04/25/2023 SLP Individual Time: 8556-8541 SLP Individual Time Calculation (min): 15 min  Short Term Goals: Week 1: SLP Short Term Goal 1 (Week 1): Patient will answer basic environmental and biographical yes/no questions with 90% accuracy given max assist. SLP Short Term Goal 2 (Week 1): Patient will follow 1-step directions with 90% accuracy given max assist. SLP Short Term Goal 3 (Week 1): Patient will verbalize trained word-level functional utterances in 8/10 opportunities given max assist.  Skilled Therapeutic Interventions: SLP conducted skilled therapy session targeting communication goals. Patient agreeable to speech therapy session. SLP spent session facilitating discussion re: patient's business and family members using available pictures in the room. SLP provided total multimodal cues to approximate verbalizations of family members' names. Patient exhibits intermittent ability to approximate word and phrase level utterances, however this was not consistent. Will continue to assess for true apraxia vs. Aphasia vs. Behavioral components to deficits. Patient exhibited difficulty communicating environmental wants/needs but was able to gesture intermittently to communicate re: pain and positioning. Patient was left in lowered bed with call bell in reach and bed alarm set. SLP will continue to target goals per plan of care.       Pain Pain Assessment Pain Scale: 0-10 Pain Score: 0-No pain  Therapy/Group: Individual Therapy  Keisy Strickler, M.A., CCC-SLP  Hind Chesler A Goddess Gebbia 04/25/2023, 4:11 PM

## 2023-04-25 NOTE — Progress Notes (Signed)
 Patient ID: Brittany [name], female   DOB: 29-Jun-1965, 58 y.o.   MRN: [id number]  Met with pt and her parents are present in her room to give team conference update with goals of supervision level and target and discharge 2/13. Pt looks much better today and is smiling and conversant. Plan will be for pt to go to parents home to have supervision and will try to get her PCS worker to follow her there. Have contact of Jan's [facility] [phone]. Will call to see what needs to be done to be done for this to happen. Discussed follow up and DME needed. She would like a rollator for the seat. Have messaged PT this will await  response.

## 2023-04-25 NOTE — Progress Notes (Signed)
 PROGRESS NOTE   Subjective/Complaints:  Alert this AM, participating with therapy.  Repeat CT head today per neuro scheduled.   ROS: limited due to aphasia.   Objective:   CT ABDOMEN PELVIS WO CONTRAST Result Date: 04/24/2023 CLINICAL DATA:  Abdominal pain, acute, nonlocalized. EXAM: CT ABDOMEN AND PELVIS WITHOUT CONTRAST TECHNIQUE: Multidetector CT imaging of the abdomen and pelvis was performed following the standard protocol without IV contrast. RADIATION DOSE REDUCTION: This exam was performed according to the departmental dose-optimization program which includes automated exposure control, adjustment of the mA and/or kV according to patient size and/or use of iterative reconstruction technique. COMPARISON:  Abdominal radiography 04/20/2023.  CT 05/30/2018. FINDINGS: Lower chest: Mild atelectasis in the right middle lobe. No pleural effusion. Hepatobiliary: Liver parenchyma appears normal without contrast. No calcified gallstones. Pancreas: Normal Spleen: Normal Adrenals/Urinary Tract: Adrenal glands are normal. Right kidney is normal. Left kidney is normal except for a simple appearing cyst, maximal dimension 5.7 cm, enlarged since 2020. No hydronephrosis. Bladder is normal. Stomach/Bowel: Stomach and small intestine are normal. Appendix is normal. Moderate amount of stool and fluid in the colon without evidence of frank obstruction. No finding to suggest colitis or diverticulitis. Vascular/Lymphatic: Aortic atherosclerosis. No aneurysm. IVC is normal. No adenopathy. Edematous stranding in the right groin, presumably subsequent to the previous vascular access. No evidence of well-defined focal collection. Reproductive: No pelvic mass. Other: No free fluid or air. Musculoskeletal: Ordinary mild lumbar degenerative changes. IMPRESSION: 1. No acute finding by CT. Moderate amount of stool and fluid in the colon without evidence of frank  obstruction. No finding to suggest colitis or diverticulitis. 2. Aortic atherosclerosis. 3. Edematous stranding in the right groin, presumably subsequent to the previous vascular access. No evidence of well-defined focal collection. Electronically Signed   By: Oneil Officer M.D.   On: 04/24/2023 15:44   CT HEAD WO CONTRAST ( ) Result Date: 04/24/2023 CLINICAL DATA:  Stroke.  Follow-up. EXAM: CT HEAD WITHOUT CONTRAST TECHNIQUE: Contiguous axial images were obtained from the base of the skull through the vertex without intravenous contrast. RADIATION DOSE REDUCTION: This exam was performed according to the departmental dose-optimization program which includes automated exposure control, adjustment of the mA and/or kV according to patient size and/or use of iterative reconstruction technique. COMPARISON:  CT 04/16/2023 FINDINGS: Brain: No focal abnormality affects the brainstem. Old infarctions noted within the inferior cerebellum on the left. Previously placed left MCA vascular stent. Previously seen hemorrhagic infarction in the left insula, lateral basal ganglia and frontal operculum with expected regional low density. Central blood products remain visible and actually appear slightly more dense and better circumscribed, suggesting that there could be some additional or ongoing hemorrhage. Alternatively, this could be due to clot contraction. Previously, the hemorrhage was becoming less dense and less distinct. Other scattered acute infarctions in the left MCA territory continue to show low-density but no hemorrhage in those locations. No evidence of a new regional stroke. Mild mass effect but no midline shift. No subdural or subarachnoid bleeding. Vascular: No other vascular finding. Skull: Negative Sinuses/Orbits: Clear/normal Other: None IMPRESSION: 1. Previously seen hemorrhagic infarction in the left insula, lateral basal ganglia and frontal operculum with expected regional  low density. Central blood  products remain visible and actually appear slightly more dense and better circumscribed, suggesting that there could be some additional or ongoing hemorrhage. Previously, the hemorrhage was becoming less dense and less distinct. However, an additional explanation could be that of clot contraction. 2. Other scattered acute infarctions in the left MCA territory continue to show low-density but no hemorrhage in those locations. 3. No evidence of a new regional stroke. Electronically Signed   By: Oneil Officer M.D.   On: 04/24/2023 15:41   Recent Labs    04/23/23 0555 04/24/23 1414  WBC 4.6 4.4  HGB 13.4 13.8  HCT 39.7 40.6  PLT 200 186   Recent Labs    04/23/23 0555 04/24/23 1414  NA 135 136  K 4.6 3.6  CL 102 103  CO2 22 21*  GLUCOSE 84 99  BUN 17 17  CREATININE 0.97 1.14*  CALCIUM  8.9 9.1         Intake/Output Summary (Last 24 hours) at 04/25/2023 1027 Last data filed at 04/25/2023 0907 Gross per 24 hour  Intake 75 ml  Output --  Net 75 ml        Physical Exam: Vital Signs Blood pressure (!) 147/80, pulse 89, temperature 98 F (36.7 C), resp. rate 18, height 5' 2 (1.575 m), weight 67.4 kg, SpO2 98%.  Constitutional: Laying in bed, Alert and wake, NAD HENT:   Atraumatic, normocephalic. Eyes: PERRLA. EOM seem intact. No notable nystagmus.  Cardiovascular: RRR, no murmurs/rub/gallops. No Edema. Peripheral pulses 2+  Respiratory: CTAB. No rales, rhonchi, or wheezing. On RA.  Abdomen: soft, + bowel sounds, more normoactive today. Nondistended today; no areas of tenderness to the abdomen today.  Skin: C/D/I. No apparent lesions. Extremities: R groin with large ?hematoma in the inguinal canal area, not indurated but firm, somewhat rubbery, mildly tender, no overlying erythema or warmth, scattered bruising to this area and further proximally approaching the anterolateral thigh where there is a larger bruise.  Bruise does not appear any larger based on marker lines on her skin  from prior day.  She has an ice pack over this area. Neuro: Alert awake, occasional following yes/no and occasional following commands. Continued aphasia appears more expressive although difficult to assess.    PRIOR EXAMS: MSK:      No apparent deformity. No joint pain on ROM.        Neurologic exam:  Cognition: Awake, alert. Responds to 1/3 verbal commands,  2/3 commands with physical cues.  + Severe R hemineglect Language: Global aphasia, expressive > receptive. Anomic. Dysarthric.  Insight: Poor insight into current condition.  Mood: Anxious,  but appropriate and interactive.  Sensation: To light touch reduced on RLE Reflexes: Hyporeflexic RLE, 2+ RUE; 1+ LUE and LLE. Negative Hoffman's and babinski signs bilaterally.  CN: + Mild facial droop  Coordination: No apparent tremors.   Spasticity: MAS 1-2 R elbow flexors, 1 elbow extensors, 1 wrist flexors      Strength: 5/5 LUE and LLE;       R side difficult to assess due to neglect.  RUE 1/5 grip, elbow flexion c/b motor apraxia RLE   3/5 x1 with R hip flexor and knee flexor; no ankle movement.     Assessment/Plan: 1. Functional deficits which require 3+ hours per day of interdisciplinary therapy in a comprehensive inpatient rehab setting. Physiatrist is providing close team supervision and 24 hour management of active medical problems listed below. Physiatrist and rehab team continue to assess barriers  to discharge/monitor patient progress toward functional and medical goals  Care Tool:  Bathing    Body parts bathed by patient: Right arm, Left arm, Chest, Abdomen, Front perineal area, Buttocks, Right upper leg, Left upper leg, Right lower leg, Left lower leg, Face         Bathing assist Assist Level: Contact Guard/Touching assist     Upper Body Dressing/Undressing Upper body dressing   What is the patient wearing?: Hospital gown only    Upper body assist Assist Level: Minimal Assistance - Patient > 75%    Lower Body  Dressing/Undressing Lower body dressing      What is the patient wearing?: Underwear/pull up, Pants     Lower body assist Assist for lower body dressing: Minimal Assistance - Patient > 75%     Toileting Toileting    Toileting assist Assist for toileting: Contact Guard/Touching assist     Transfers Chair/bed transfer  Transfers assist     Chair/bed transfer assist level: Minimal Assistance - Patient > 75%     Locomotion Ambulation   Ambulation assist      Assist level: Minimal Assistance - Patient > 75% Assistive device: Hand held assist (HHA or using IV pole) Max distance: 75 ft   Walk 10 feet activity   Assist     Assist level: Contact Guard/Touching assist Assistive device: Other (comment) (IV pole)   Walk 50 feet activity   Assist    Assist level: Minimal Assistance - Patient > 75% Assistive device: Other (comment) (IV pole)    Walk 150 feet activity   Assist Walk 150 feet activity did not occur: Safety/medical concerns         Walk 10 feet on uneven surface  activity   Assist Walk 10 feet on uneven surfaces activity did not occur: Safety/medical concerns         Wheelchair     Assist Is the patient using a wheelchair?: Yes Type of Wheelchair: Manual    Wheelchair assist level: Dependent - Patient 0% Max wheelchair distance: 250    Wheelchair 50 feet with 2 turns activity    Assist        Assist Level: Dependent - Patient 0%   Wheelchair 150 feet activity     Assist      Assist Level: Dependent - Patient 0%   Blood pressure (!) 147/80, pulse 89, temperature 98 F (36.7 C), resp. rate 18, height 5' 2 (1.575 m), weight 67.4 kg, SpO2 98%.  Medical Problem List and Plan: 1. 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  R-CFV DVT (found 1/31)/Antithrombotics: -DVT/anticoagulation:  Pharmaceutical: Heparin  low dose X 48 hours and if stable transition to DOAC              -antiplatelet therapy: Brillinta. ASA d/c due to addition of Heparin .  - Per lit review, early mobilization current recommendation, without gross restrictions;  no convincing evidence of increased PE risk.   -04/22/23 long discussion with pt and brother regarding clot location, this coincides with area of pain, large palpable ?hematoma to R groin; had mechanical thrombectomy-- called IR Dr. Juliene Balder who relayed to the team, Delon Beagle NP will come see the patient, appreciate their assistance with this case. For now, added Tramadol  100mg  q8h PRN to help with pain.  -04/23/23 Vas U/S with hematoma/continued DVT-smaller, Pt seen by IR this AM - recommend avoiding bending or stooping, limited mobility- transfers only for 1-2 days.  Ok to transition to eliquis   -2/4 I am told IR coming back today for reevaluation   3. Pain Management:  Tylenol  prn. Gabapentin  300mg  TID -04/22/23 added Tramadol  100mg  q8h PRN for pain in R groin (see above)-- pt allergic/intolerant to other hydro/oxycodone .  -2/4 scheduled Tylenol  -2/5 appears much less uncomfortable today, continue current 4. Mood/Behavior/Sleep: LCSW to follow for evaluation and support.              -antipsychotic agents: N/A -04/21/23 didn't sleep well, will add melatonin 5mg  PRN, has trazodone  PRN as well -04/23/23 will discontinue tramadol , Benadryl  as needed, DC melatonin, DC hydrocodone  -2/5 trazodone  25mg  hs started yesterday, seroquel  PRN was started 5. Neuropsych/cognition: This patient is not capable of making decisions on her own behalf. 6. Skin/Wound Care: Routine  pressure relief measures.  7. Fluids/Electrolytes/Nutrition: Monitor I/O. Check routine labs -04/21/23 Cr 1.19 a little up from yesterday but down from prior, monitor; K 3.3, see below; remainder of CMP unremarkable, monitor labs 8. HTN: Monitor BP TID--continue amlodipine  10mg  daily and hydralazine  50mg  q8h             --avoid hypoperfusion.   -2/4 intermittently elevated, continue to monitor trend for now.  Avoid hypotension Vitals:   04/23/23 1531 04/23/23 1943 04/23/23 2047 04/24/23 0524  BP: (!) 147/61 121/79 121/79 135/72   04/24/23 0605 04/24/23 1117 04/24/23 1232 04/24/23 1340  BP: 135/72 (!) 157/71 (!) 147/74 (!) 140/90   04/24/23 1918 04/24/23 2149 04/25/23 0553 04/25/23 0557  BP: (!) 149/80 (!) 149/80 (!) 147/80 (!) 147/80    9. RLQ pain:  Felt to be due to IBS. Has been on miralax  daily and Senna S 2 tabs nightly without results --Linzess  72mcg every day was started today. Will get KUB  -04/21/23 KUB with gas filled colon but otherwise neg/nonobstructive; reviewed images myself, suspect stool burden contributory to gas trapped in R side; will increase miralax /colace to BID, leave senna S as is, add sorbitol  30ml PRN if no BM today; has PRN dulcolax supp and fleet if needed too; monitor closely -04/22/23 pt insists she's had BMs daily, but no documentation; no abd pain today, appears it's R groin pain-- see above #2-- tramadol  added, leave stool softeners in place, NEED DOCUMENTATION OF STOOLS -2/5 LBM today reported 10. Hypokalemia: Supplement added X 2 days.  -04/21/23 K 3.3, potassium supplement not ordered, 40mEq daily x2 days added today -recheck today 11. Hyperlipidemia: LDL-124 on Lipitor  80 mg and Zetia  10mg  daily.  12. Pre-diabetes: Hgb A1c- 5.9 and every 4 hours CBS have shown BS controlled. --Will monitor fasting BS on serial labs.  13. Substance abuse: Educated on stroke risk w/marijuana.  14. H/o MM: Acute renal failure improving SCr 1.11 @ admission-->1.59-->1.07  -04/21/23  Cr 1.19, monitor  -2/3 Cr improved to 0.97  Recheck labs, start IVF NS 75ml/hr due to poor intake   2/5 recheck labs today 15. GERD: On protonix  40mg  daily  LOS: 5 days A FACE TO FACE EVALUATION WAS PERFORMED  Murray Collier 04/25/2023, 10:27 AM

## 2023-04-25 NOTE — Progress Notes (Signed)
 Patient is s/p  revascularization of occluded left middle cerebral M1 segment, with 1 pass with a 4 mm x 40 mm Solitaire X retrieval device, and balloon angioplasty of the occluded left middle cerebral M1 segment secondary to intracranial arteriosclerosis. Stenting of the left middle cerebral artery M1 segment using a 2 mm x 12 mm anterior balloon mounted stent with restoration of TICI 2C revascularization with Dr. Thyra Nash on 04/18/23, hospital stay complicated by right common femoral vein, and SF junction. DVT (2.1.25). Started on a heparin  gtt, IR was consulted for eval due to tenderness in the right groin on 04/22/23. [facility] US  showed a small hematoma, LN, nopseudoaneurysm or AVF.    The hematoma was marked on 2/3.  Today the hematoma is regressed tremendously, there is still small area of hardness right under the puncture site. No tenderness. Patient has been working with PT w/ difficulty.   Discussed finding with Dr. Nash, recommends light activity for another 24 hours.  Attending and PT team notified.   Please call IR for questions and concerns.    Lindsi Bayliss H Genesia Caslin PA-C 04/25/2023 4:07 PM

## 2023-04-25 NOTE — Progress Notes (Signed)
Patient appears to be doing better today, smiling, following commands. Interacted great with staff and MD. Staff present, assisted with lunch at this time.    [name] Dome, LPN

## 2023-04-25 NOTE — Consult Note (Signed)
 Stroke Neurology Consultation Note  Consult Requested by: Dr Urbano  Reason for Consult: CT changes  Consult Date: 04/25/23   The history was obtained from the primary team and nursing staff.  During history and examination, all items were able to obtain unless otherwise noted.  History of Present Illness:  Brittany [name] is a 58 y.o. African American female with PMH of stroke, HTN, HLD, neuropathy, MM was recently admitted for left MCA stroke. Found to have left MCA occlusion and received mechanical thrombectomy with rescue stenting. She was put on ASA and brilinta . Later she was found to have RLE DVT with positive PFO. She was started on heparin  IV and continued brilinta  but discontinued on ASA. She was then admitted to [facility] on 1/31 for further rehab. Since admission, her heparin  IV changed to eliquis  and continued brilinta . She worked with PT and OT but complained right groin pain from the incision site. US  done and no pseudoaneurysm. She was found to have some episode of AMS and restless but no focal neuro changes. Had CT repeat questionable some increased signal at left hemorrhagic transformation site. Neuro consulted. Repeat CT today showed stable HT.    Past Medical History:  Diagnosis Date   Anemia    Arthritis    trigger finger in left hand   Complication of anesthesia    per pt, hard to wake up!   Elevated cholesterol    Female bladder prolapse    per urologist, does not have prolaspe   GERD (gastroesophageal reflux disease)    Heart murmur    pt unsure.   Hiatal hernia    Hypertension    IBS (irritable bowel syndrome)    Multiple myeloma (HCC) 2005   had partial chemo   Peripheral neuropathy    SOB (shortness of breath) on exertion    uses an inhaler   Stroke (HCC) 2017   paralysis left arm/uses a walker    Past Surgical History:  Procedure Laterality Date   BONE BIOPSY  2005   in her back   COLONOSCOPY     over 10 years x3    ESOPHAGOGASTRODUODENOSCOPY     incomplete-over 10 years ago    IR CT HEAD LTD  04/16/2023   IR CT HEAD LTD  04/16/2023   IR INTRA CRAN STENT  04/16/2023   IR PERCUTANEOUS ART THROMBECTOMY/INFUSION INTRACRANIAL INC DIAG ANGIO  04/16/2023   RADIOLOGY WITH ANESTHESIA N/A 04/16/2023   Procedure: IR WITH ANESTHESIA;  Surgeon: Dolphus Carrion, MD;  Location: [facility] OR;  Service: Radiology;  Laterality: N/A;   TEE WITHOUT CARDIOVERSION N/A 12/08/2016   Procedure: TRANSESOPHAGEAL ECHOCARDIOGRAM (TEE);  Surgeon: Shlomo Wilbert SAUNDERS, MD;  Location: [facility] ENDOSCOPY;  Service: Cardiovascular;  Laterality: N/A;   TUBAL LIGATION     UPPER GASTROINTESTINAL ENDOSCOPY     WISDOM TOOTH EXTRACTION      Family History  Problem Relation Age of Onset   Hypertension Mother    Sarcoidosis Mother        currently in remission    Diverticulitis Mother    Irritable bowel syndrome Mother    Liver cancer Mother    Hypertension Father    Stomach cancer Father    Congestive Heart Failure Father    Stroke Maternal Uncle    Scoliosis Brother    Colon cancer Neg Hx    Esophageal cancer Neg Hx    Colon polyps Neg Hx    Rectal cancer Neg Hx     Social History:  reports that she quit smoking about 7 years ago. Her smoking use included cigarettes. She has never used smokeless tobacco. She reports that she does not currently use alcohol. She reports that she does not currently use drugs after having used the following drugs: Marijuana.  Allergies:  Allergies  Allergen Reactions   Amitriptyline  Other (See Comments)    Patient reported that it made her throat feel like its locking up and it also caused issues with her going to the bathroom   Duloxetine  Other (See Comments)    Patient reported that it made her throat lock up and it caused her to have issue with going to the bathroom   Naproxen      Vomiting, sweating, abd spasms   Other     States can't take pain meds that end in cet or meds that end in  ine Darvocet/severe vomiting   Beef-Derived Drug Products     Patient has IBS prefers no beef   Pork-Derived Products     Patient has IBS prefers no pork   Darvon [Propoxyphene] Nausea And Vomiting   Hydrocodone  Nausea And Vomiting   Lactose Intolerance (Gi)     Bloating, gas, abd pain   Latex Itching and Rash   Oxycodone  Nausea And Vomiting   Percocet [Oxycodone -Acetaminophen ] Nausea And Vomiting   Topamax  [Topiramate ]     Memory made her emotional     No current facility-administered medications on file prior to encounter.   Current Outpatient Medications on File Prior to Encounter  Medication Sig Dispense Refill   amLODipine  (NORVASC ) 10 MG tablet Take 1 tablet (10 mg total) by mouth daily. (Patient not taking: Reported on 04/12/2023) 30 tablet 0   atorvastatin  (LIPITOR ) 80 MG tablet Take 1 tablet (80 mg total) by mouth daily. 90 tablet 3   cholecalciferol  (CHOLECALCIFEROL ) 25 MCG tablet Take 1 tablet (1,000 Units total) by mouth daily.     docusate sodium  (COLACE) 100 MG capsule Take 1 capsule (100 mg total) by mouth daily.     ezetimibe  (ZETIA ) 10 MG tablet Take 1 tablet (10 mg total) by mouth daily.     gabapentin  (NEURONTIN ) 300 MG capsule Take 1 capsule (300 mg total) by mouth 3 (three) times daily. 90 capsule 3   heparin  25000 UT/250ML infusion Inject 850 Units/hr into the vein continuous.     hydrALAZINE  (APRESOLINE ) 50 MG tablet Take 1 tablet (50 mg total) by mouth every 8 (eight) hours. 90 tablet 0   linaclotide  (LINZESS ) 72 MCG capsule Take 1 capsule (72 mcg total) by mouth daily before breakfast. 90 capsule 1   ticagrelor  (BRILINTA ) 90 MG TABS tablet Take 1 tablet (90 mg total) by mouth 2 (two) times daily.      Review of Systems: A full ROS was attempted today and was able to be performed.  Systems assessed include - Constitutional, Eyes, HENT, Respiratory, Cardiovascular, Gastrointestinal, Genitourinary, Integument/breast, Hematologic/lymphatic, Musculoskeletal,  Neurological, Behavioral/Psych, Endocrine, Allergic/Immunologic - with pertinent responses as per HPI.  Physical Examination: Temp:  [97.7 F (36.5 C)-98.1 F (36.7 C)] 98 F (36.7 C) (02/05 0553) Pulse Rate:  [89-101] 89 (02/05 0553) Resp:  [17-18] 18 (02/05 0553) BP: (140-149)/(74-90) 147/80 (02/05 0557) SpO2:  [98 %] 98 % (02/05 0553)  General - well nourished, well developed, in no apparent distress.    Ophthalmologic - fundi not visualized due to noncooperation.    Cardiovascular - regular rhythm and rate  Neuro - awake, alert, eyes open, still has dense expressive aphasia, but following all  simple commands, seems able to repeat I am here with severe dysarthric voice but not able to repeat more than 3-word sentences. Not able to name. No gaze palsy, tracking bilaterally, visual field full. Mild right facial droop. Tongue midline. Bilateral UEs 5/5 except R hand grip 4/5 and decreased dexterity. Bilaterally LEs 5/5, no drift. Sensation symmetrical not cooperative, b/l FTN intact grossly, gait not tested.    Data Reviewed: CT ABDOMEN PELVIS WO CONTRAST Result Date: 04/24/2023 CLINICAL DATA:  Abdominal pain, acute, nonlocalized. EXAM: CT ABDOMEN AND PELVIS WITHOUT CONTRAST TECHNIQUE: Multidetector CT imaging of the abdomen and pelvis was performed following the standard protocol without IV contrast. RADIATION DOSE REDUCTION: This exam was performed according to the departmental dose-optimization program which includes automated exposure control, adjustment of the mA and/or kV according to patient size and/or use of iterative reconstruction technique. COMPARISON:  Abdominal radiography 04/20/2023.  CT 05/30/2018. FINDINGS: Lower chest: Mild atelectasis in the right middle lobe. No pleural effusion. Hepatobiliary: Liver parenchyma appears normal without contrast. No calcified gallstones. Pancreas: Normal Spleen: Normal Adrenals/Urinary Tract: Adrenal glands are normal. Right kidney is normal.  Left kidney is normal except for a simple appearing cyst, maximal dimension 5.7 cm, enlarged since 2020. No hydronephrosis. Bladder is normal. Stomach/Bowel: Stomach and small intestine are normal. Appendix is normal. Moderate amount of stool and fluid in the colon without evidence of frank obstruction. No finding to suggest colitis or diverticulitis. Vascular/Lymphatic: Aortic atherosclerosis. No aneurysm. IVC is normal. No adenopathy. Edematous stranding in the right groin, presumably subsequent to the previous vascular access. No evidence of well-defined focal collection. Reproductive: No pelvic mass. Other: No free fluid or air. Musculoskeletal: Ordinary mild lumbar degenerative changes. IMPRESSION: 1. No acute finding by CT. Moderate amount of stool and fluid in the colon without evidence of frank obstruction. No finding to suggest colitis or diverticulitis. 2. Aortic atherosclerosis. 3. Edematous stranding in the right groin, presumably subsequent to the previous vascular access. No evidence of well-defined focal collection. Electronically Signed   By: Oneil Officer M.D.   On: 04/24/2023 15:44   CT HEAD WO CONTRAST ( ) Result Date: 04/24/2023 CLINICAL DATA:  Stroke.  Follow-up. EXAM: CT HEAD WITHOUT CONTRAST TECHNIQUE: Contiguous axial images were obtained from the base of the skull through the vertex without intravenous contrast. RADIATION DOSE REDUCTION: This exam was performed according to the departmental dose-optimization program which includes automated exposure control, adjustment of the mA and/or kV according to patient size and/or use of iterative reconstruction technique. COMPARISON:  CT 04/16/2023 FINDINGS: Brain: No focal abnormality affects the brainstem. Old infarctions noted within the inferior cerebellum on the left. Previously placed left MCA vascular stent. Previously seen hemorrhagic infarction in the left insula, lateral basal ganglia and frontal operculum with expected regional low  density. Central blood products remain visible and actually appear slightly more dense and better circumscribed, suggesting that there could be some additional or ongoing hemorrhage. Alternatively, this could be due to clot contraction. Previously, the hemorrhage was becoming less dense and less distinct. Other scattered acute infarctions in the left MCA territory continue to show low-density but no hemorrhage in those locations. No evidence of a new regional stroke. Mild mass effect but no midline shift. No subdural or subarachnoid bleeding. Vascular: No other vascular finding. Skull: Negative Sinuses/Orbits: Clear/normal Other: None IMPRESSION: 1. Previously seen hemorrhagic infarction in the left insula, lateral basal ganglia and frontal operculum with expected regional low density. Central blood products remain visible and actually appear slightly more dense and  better circumscribed, suggesting that there could be some additional or ongoing hemorrhage. Previously, the hemorrhage was becoming less dense and less distinct. However, an additional explanation could be that of clot contraction. 2. Other scattered acute infarctions in the left MCA territory continue to show low-density but no hemorrhage in those locations. 3. No evidence of a new regional stroke. Electronically Signed   By: Oneil Officer M.D.   On: 04/24/2023 15:41   VAS US  GROIN PSEUDOANEURYSM Result Date: 04/23/2023  ARTERIAL PSEUDOANEURYSM  Patient Name:  KRYSTYNA CLECKLEY  Date of Exam:   04/22/2023 Medical Rec #: [id number]              Accession #:    [id number] Date of Birth: [birth year]              Patient Gender: F Patient Age:   38 years Exam Location:  [facility] Procedure:      VAS US  QUILLIAN CAPES Referring Phys: DELON BEAGLE --------------------------------------------------------------------------------  Exam: Right groin Indications: Patient complains of groin pain and bruising. History: S/p arteriogram 04/16/23.  Limitations: Edema, pain with pressure Comparison Study: No prior study Performing Technologist: Alberta Lis RVS  Examination Guidelines: A complete evaluation includes B-mode imaging, spectral Doppler, color Doppler, and power Doppler as needed of all accessible portions of each vessel. Bilateral testing is considered an integral part of a complete examination. Limited examinations for reoccurring indications may be performed as noted.  Summary: No evidence of pseudoaneurysm or AVF. There is a small hematoma noted as well as a lymph node at area of concern. The partial CFV DVT found 04/20/2023 appears to be resolving.  Diagnosing physician: Lonni Gaskins MD Electronically signed by Lonni Gaskins MD on 04/23/2023 at 11:44:41 AM.   --------------------------------------------------------------------------------    Final    VAS US  LOWER EXTREMITY VENOUS (DVT) Result Date: 04/21/2023  Lower Venous DVT Study Patient Name:  KONNOR JORDEN  Date of Exam:   04/20/2023 Medical Rec #: [id number]              Accession #:    [id number] Date of Birth: 1965-10-01              Patient Gender: F Patient Age:   33 years Exam Location:  [facility] Procedure:      VAS US  LOWER EXTREMITY VENOUS (DVT) Referring Phys: ARY CUMMINS --------------------------------------------------------------------------------  Indications: Stroke, Embolic stroke., and Post-op.  Comparison Study: Previous study on 9.21.2018. Performing Technologist: Edilia Elden Appl  Examination Guidelines: A complete evaluation includes B-mode imaging, spectral Doppler, color Doppler, and power Doppler as needed of all accessible portions of each vessel. Bilateral testing is considered an integral part of a complete examination. Limited examinations for reoccurring indications may be performed as noted. The reflux portion of the exam is performed with the patient in reverse Trendelenburg.   +---------+---------------+---------+-----------+----------+--------------+ RIGHT    CompressibilityPhasicitySpontaneityPropertiesThrombus Aging +---------+---------------+---------+-----------+----------+--------------+ CFV      Partial        Yes      Yes                  Acute          +---------+---------------+---------+-----------+----------+--------------+ SFJ      Partial        Yes      Yes                                 +---------+---------------+---------+-----------+----------+--------------+  FV Prox  Full                                                        +---------+---------------+---------+-----------+----------+--------------+ FV Mid   Full                                                        +---------+---------------+---------+-----------+----------+--------------+ FV DistalFull                                                        +---------+---------------+---------+-----------+----------+--------------+ PFV      Full                                                        +---------+---------------+---------+-----------+----------+--------------+ POP      Full           Yes      Yes                                 +---------+---------------+---------+-----------+----------+--------------+ PTV      Full                                                        +---------+---------------+---------+-----------+----------+--------------+ PERO     Full                                                        +---------+---------------+---------+-----------+----------+--------------+ S/P revascularization of occluded left M1 with right CFA being point of access  +---------+---------------+---------+-----------+----------+--------------+ LEFT     CompressibilityPhasicitySpontaneityPropertiesThrombus Aging +---------+---------------+---------+-----------+----------+--------------+ CFV      Full           Yes      Yes                                  +---------+---------------+---------+-----------+----------+--------------+ SFJ      Full           Yes      Yes                                 +---------+---------------+---------+-----------+----------+--------------+ FV Prox  Full                                                        +---------+---------------+---------+-----------+----------+--------------+  FV Mid   Full                                                        +---------+---------------+---------+-----------+----------+--------------+ FV DistalFull                                                        +---------+---------------+---------+-----------+----------+--------------+ PFV      Full                                                        +---------+---------------+---------+-----------+----------+--------------+ POP      Full           Yes      Yes                                 +---------+---------------+---------+-----------+----------+--------------+ PTV      Full                                                        +---------+---------------+---------+-----------+----------+--------------+ PERO     Full                                                        +---------+---------------+---------+-----------+----------+--------------+     Summary: RIGHT: - Findings consistent with acute deep vein thrombosis involving the right common femoral vein, and SF junction.  - No cystic structure found in the popliteal fossa.  LEFT: - There is no evidence of deep vein thrombosis in the lower extremity.  - No cystic structure found in the popliteal fossa.  *See table(s) above for measurements and observations. Electronically signed by Penne Colorado MD on 04/21/2023 at 4:06:36 PM.    Final    DG Abd 1 View Result Date: 04/20/2023 CLINICAL DATA:  Right lower quadrant abdominal pain. History of stroke. EXAM: ABDOMEN - 1 VIEW COMPARISON:  04/17/2023 FINDINGS:  Gas-filled colon. No large or small bowel distention. No radiopaque stones. Visualized bones and soft tissue contours appear intact. Lung bases are clear. IMPRESSION: Normal nonobstructive bowel gas pattern. Electronically Signed   By: Elsie Gravely M.D.   On: 04/20/2023 22:32   IR PERCUTANEOUS ART THROMBECTOMY/INFUSION INTRACRANIAL INC DIAG ANGIO Result Date: 04/18/2023 INDICATION: Worsening aphasia and right-sided weakness. Right-sided neglect. Occlusion of the left middle cerebral artery on CT angiogram of the head and neck. EXAM: 1. EMERGENT LARGE VESSEL OCCLUSION THROMBOLYSIS (anterior CIRCULATION) COMPARISON:  CT angiogram of the head and neck of April 16, 2023. MEDICATIONS: Ancef 2 g IV was administered within 1 hour of the procedure. ANESTHESIA/SEDATION: General anesthesia. CONTRAST:  Omnipaque  300 approximately 90 mL.  FLUOROSCOPY TIME:  Fluoroscopy Time: 79 minutes 18 seconds (2411 mGy). COMPLICATIONS: None immediate. TECHNIQUE: Following a full explanation of the procedure along with the potential associated complications, an informed witnessed consent was obtained. The risks of intracranial hemorrhage of 10%, worsening neurological deficit, ventilator dependency, death and inability to revascularize were all reviewed in detail with the patient's nephew. The patient was then put under general anesthesia by the Department of Anesthesiology at [facility]. The right groin was prepped and draped in the usual sterile fashion. Thereafter using modified Seldinger technique, transfemoral access into the right common femoral artery was obtained without difficulty. Over an 0.035 inch guidewire an 8 French 25 cm Pinnacle sheath was inserted. Through this, and also over an 0.035 inch guidewire a combination of an 087 balloon guide catheter with a 125 cm 6 French Berenstein support catheter was advanced to the aortic arch region, and selectively positioned in the left common carotid artery and the  distal left internal carotid artery. FINDINGS: The left common carotid arteriogram demonstrates the left external carotid artery and its major branches to be widely patent. The left internal carotid artery at the bulb has a smooth shallow plaque along the posterior wall with a less than 20% stenosis by the NASCET criteria. No evidence of intraluminal filling defects, or ulcerations evident. More distally, the vessel is seen to opacify to the cranial skull base. The petrous, the cavernous and the supraclinoid left ICA demonstrate wide patency. Left anterior cerebral artery demonstrates a moderate stenosis at its origin. Its branches opacify into the capillary and venous phases. The left middle cerebral artery is occluded just at its origin. PROCEDURE: Through the balloon guide catheter in the distal left ICA, a combination of an 062 aspiration catheter with an 021 160 cm Trevo ProVue microcatheter was advanced over an 018 inch micro guidewire to the supraclinoid left ICA. The micro guidewire was then gently probed into the occluded left middle cerebral artery for distal access. The 018 inch Aristotle micro guidewire with a moderate J configuration was then replaced with an Aristotle 014 micro guidewire with a J-tip configuration, this was then advanced into the occluded proximal left middle cerebral artery followed by the microcatheter. With a gentle maneuvering of the micro guidewire using a torque device, access was then obtained into the superior division followed by the microcatheter. The guidewire was removed. Good aspiration obtained from the hub of the microcatheter. A gentle control arteriogram performed through this demonstrated safe positioning of the tip of the microcatheter which was then connected to continuous heparinized saline infusion. A 4 mm x 40 mm Solitaire X retrieval device was then deployed from the mid M2 segment of the superior division to just proximal to the occluded left M1 segment. A  control arteriogram performed through the 062 aspiration catheter in the distal left ICA demonstrated patency of the left middle cerebral artery territory retrieving a TICI 2C revascularization. Also uncovered was segmental area of severe atherosclerotic stenosis involving the left M1 segment. Decompressed superior and inferior division branches were also evident. The retriever was then captured by advancing the microcatheter into the distal M2 segment of the superior division. A control arteriogram performed through this demonstrated safe positioning of the tip of the microcatheter. The microcatheter was then exchanged for an 014 inch 300 cm Zoom exchange micro guidewire with a moderate J configuration which was maintained in the distal M2 M3 region while the microcatheter was removed. Over this, a 2 mm x 9 mm Gateway angioplasty  microcatheter which had been prepped with 50% contrast and 50% heparinized saline infusion was advanced without difficulty through an intermediate catheter and positioned covering the entirety of the M1 segment of the left middle cerebral artery severely stenotic due to intracranial arteriosclerosis. A control angioplasty was then performed to 6 atmospheres achieving a diameter of approximately 1.8 mm where it was maintained for approximately 2 minutes. Thereafter, the balloon was deflated and retrieved and removed proximally. A control arteriogram performed through the intermediate catheter in the distal left ICA demonstrated free flow through the left M1 segment into the left MCA branches maintaining a TICI 2C revascularization. However, a control arteriogram performed a few minutes later demonstrated near complete occlusion due to underlying severe intracranial arteriosclerosis. Due to the reocclusion of the left M1 segment, a 2 mm x 12 mm Onyx Frontier balloon mounted stent was chosen for stent placement at the site of the M1 atherosclerotic disease. The balloon mounted stent was  prepped with 50% contrast and 50% heparinized saline infusion antegradely and with heparinized saline infusion retrogradely. Using the rapid exchange technique, the stent delivery apparatus was advanced distally into the supraclinoid left ICA without any difficulty with the distal wire being maintained stable. The proximal and the distal markers of the stent were then aligned at the origin of the left M1 segment, and at the bifurcation of the M1 segment. Control inflation was then performed using a micro inflation syringe device via micro tubing to about 1.85 mm where it was maintained for approximately a minute and a half. Thereafter, the balloon was deflated and retrieved and removed. Control arteriograms were then performed at 10, 20 and 35 minutes post stent deployment which continued to demonstrate excellent flow through the left MCA stented segment and with maintenance of 2C revascularization of the distal MCA branches. Prior to the final arteriogram, the micro guidewire was then gently retrieved and removed without any entanglement seen with the left MCA stent. Spasm noted in the left ICA was treated with 2 aliquots of 25 mcg of nitroglycerin . Final control arteriogram performed through the balloon guide catheter in the proximal left ICA demonstrated wide patency of the left internal carotid artery extra cranially and intracranially. Again moderate spasm in the mid cervical left ICA responded to 25 mcg nitroglycerin . 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 Distal pulses remained Dopplerable feebly bilaterally, with no change in the absent dorsalis pedis pulses bilaterally as prior to the procedure. Flat panel CT of the brain demonstrated no evidence of intracranial hemorrhage. Other areas of contrast stain were evident in the basal ganglia and the left parietal cortical subcortical  areas. The patient was left intubated due to her medical condition and transferred to the neuro ICU for post revascularization care. Medications utilized: Aspirin  81 mg and Brilinta  90 mg via orogastric tube prior to the intra intracranial angioplasty. Half bolus dose of cangrelor  followed by a 4 hour half dose infusion following placement of the balloon mounted stent. IMPRESSION: Status post endovascular complete revascularization of occluded left middle cerebral M1 segment, with 1 pass with a 4 mm x 40 mm Solitaire X retrieval device, and balloon angioplasty of the occluded left middle cerebral M1 segment secondary to intracranial arteriosclerosis. Stenting of the left middle cerebral artery M1 segment using a 2 mm x 12 mm anterior balloon mounted stent with restoration of TICI 2C revascularization. PLAN: Follow-up in the clinic 4 weeks  post discharge. Electronically Signed   By: Thyra Nash M.D.   On: 04/18/2023 08:14   IR Intra Cran Stent Result Date: 04/18/2023 INDICATION: Worsening aphasia and right-sided weakness. Right-sided neglect. Occlusion of the left middle cerebral artery on CT angiogram of the head and neck. EXAM: 1. EMERGENT LARGE VESSEL OCCLUSION THROMBOLYSIS (anterior CIRCULATION) COMPARISON:  CT angiogram of the head and neck of April 16, 2023. MEDICATIONS: Ancef 2 g IV was administered within 1 hour of the procedure. ANESTHESIA/SEDATION: General anesthesia. CONTRAST:  Omnipaque  300 approximately 90 mL. FLUOROSCOPY TIME:  Fluoroscopy Time: 79 minutes 18 seconds (2411 mGy). COMPLICATIONS: None immediate. TECHNIQUE: Following a full explanation of the procedure along with the potential associated complications, an informed witnessed consent was obtained. The risks of intracranial hemorrhage of 10%, worsening neurological deficit, ventilator dependency, death and inability to revascularize were all reviewed in detail with the patient's nephew. The patient was then put under general  anesthesia by the Department of Anesthesiology at [facility]. The right groin was prepped and draped in the usual sterile fashion. Thereafter using modified Seldinger technique, transfemoral access into the right common femoral artery was obtained without difficulty. Over an 0.035 inch guidewire an 8 French 25 cm Pinnacle sheath was inserted. Through this, and also over an 0.035 inch guidewire a combination of an 087 balloon guide catheter with a 125 cm 6 French Berenstein support catheter was advanced to the aortic arch region, and selectively positioned in the left common carotid artery and the distal left internal carotid artery. FINDINGS: The left common carotid arteriogram demonstrates the left external carotid artery and its major branches to be widely patent. The left internal carotid artery at the bulb has a smooth shallow plaque along the posterior wall with a less than 20% stenosis by the NASCET criteria. No evidence of intraluminal filling defects, or ulcerations evident. More distally, the vessel is seen to opacify to the cranial skull base. The petrous, the cavernous and the supraclinoid left ICA demonstrate wide patency. Left anterior cerebral artery demonstrates a moderate stenosis at its origin. Its branches opacify into the capillary and venous phases. The left middle cerebral artery is occluded just at its origin. PROCEDURE: Through the balloon guide catheter in the distal left ICA, a combination of an 062 aspiration catheter with an 021 160 cm Trevo ProVue microcatheter was advanced over an 018 inch micro guidewire to the supraclinoid left ICA. The micro guidewire was then gently probed into the occluded left middle cerebral artery for distal access. The 018 inch Aristotle micro guidewire with a moderate J configuration was then replaced with an Aristotle 014 micro guidewire with a J-tip configuration, this was then advanced into the occluded proximal left middle cerebral artery followed  by the microcatheter. With a gentle maneuvering of the micro guidewire using a torque device, access was then obtained into the superior division followed by the microcatheter. The guidewire was removed. Good aspiration obtained from the hub of the microcatheter. A gentle control arteriogram performed through this demonstrated safe positioning of the tip of the microcatheter which was then connected to continuous heparinized saline infusion. A 4 mm x 40 mm Solitaire X retrieval device was then deployed from the mid M2 segment of the superior division to just proximal to the occluded left M1 segment. A control arteriogram performed through the 062 aspiration catheter in the distal left ICA demonstrated patency of the left middle cerebral artery territory retrieving a TICI 2C revascularization. Also uncovered was segmental area of severe  atherosclerotic stenosis involving the left M1 segment. Decompressed superior and inferior division branches were also evident. The retriever was then captured by advancing the microcatheter into the distal M2 segment of the superior division. A control arteriogram performed through this demonstrated safe positioning of the tip of the microcatheter. The microcatheter was then exchanged for an 014 inch 300 cm Zoom exchange micro guidewire with a moderate J configuration which was maintained in the distal M2 M3 region while the microcatheter was removed. Over this, a 2 mm x 9 mm Gateway angioplasty microcatheter which had been prepped with 50% contrast and 50% heparinized saline infusion was advanced without difficulty through an intermediate catheter and positioned covering the entirety of the M1 segment of the left middle cerebral artery severely stenotic due to intracranial arteriosclerosis. A control angioplasty was then performed to 6 atmospheres achieving a diameter of approximately 1.8 mm where it was maintained for approximately 2 minutes. Thereafter, the balloon was deflated  and retrieved and removed proximally. A control arteriogram performed through the intermediate catheter in the distal left ICA demonstrated free flow through the left M1 segment into the left MCA branches maintaining a TICI 2C revascularization. However, a control arteriogram performed a few minutes later demonstrated near complete occlusion due to underlying severe intracranial arteriosclerosis. Due to the reocclusion of the left M1 segment, a 2 mm x 12 mm Onyx Frontier balloon mounted stent was chosen for stent placement at the site of the M1 atherosclerotic disease. The balloon mounted stent was prepped with 50% contrast and 50% heparinized saline infusion antegradely and with heparinized saline infusion retrogradely. Using the rapid exchange technique, the stent delivery apparatus was advanced distally into the supraclinoid left ICA without any difficulty with the distal wire being maintained stable. The proximal and the distal markers of the stent were then aligned at the origin of the left M1 segment, and at the bifurcation of the M1 segment. Control inflation was then performed using a micro inflation syringe device via micro tubing to about 1.85 mm where it was maintained for approximately a minute and a half. Thereafter, the balloon was deflated and retrieved and removed. Control arteriograms were then performed at 10, 20 and 35 minutes post stent deployment which continued to demonstrate excellent flow through the left MCA stented segment and with maintenance of 2C revascularization of the distal MCA branches. Prior to the final arteriogram, the micro guidewire was then gently retrieved and removed without any entanglement seen with the left MCA stent. Spasm noted in the left ICA was treated with 2 aliquots of 25 mcg of nitroglycerin . Final control arteriogram performed through the balloon guide catheter in the proximal left ICA demonstrated wide patency of the left internal carotid artery extra cranially  and intracranially. Again moderate spasm in the mid cervical left ICA responded to 25 mcg nitroglycerin . 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 Distal pulses remained Dopplerable feebly bilaterally, with no change in the absent dorsalis pedis pulses bilaterally as prior to the procedure. Flat panel CT of the brain demonstrated no evidence of intracranial hemorrhage. Other areas of contrast stain were evident in the basal ganglia and the left parietal cortical subcortical areas. The patient was left intubated due to her medical condition and transferred to the neuro ICU for post revascularization care. Medications utilized: Aspirin  81 mg and Brilinta  90 mg via orogastric tube prior to the intra intracranial angioplasty. Half bolus  dose of cangrelor  followed by a 4 hour half dose infusion following placement of the balloon mounted stent. IMPRESSION: Status post endovascular complete revascularization of occluded left middle cerebral M1 segment, with 1 pass with a 4 mm x 40 mm Solitaire X retrieval device, and balloon angioplasty of the occluded left middle cerebral M1 segment secondary to intracranial arteriosclerosis. Stenting of the left middle cerebral artery M1 segment using a 2 mm x 12 mm anterior balloon mounted stent with restoration of TICI 2C revascularization. PLAN: Follow-up in the clinic 4 weeks post discharge. Electronically Signed   By: Thyra Nash M.D.   On: 04/18/2023 08:14   IR CT Head Ltd Result Date: 04/18/2023 INDICATION: Worsening aphasia and right-sided weakness. Right-sided neglect. Occlusion of the left middle cerebral artery on CT angiogram of the head and neck. EXAM: 1. EMERGENT LARGE VESSEL OCCLUSION THROMBOLYSIS (anterior CIRCULATION) COMPARISON:  CT angiogram of the head and neck of April 16, 2023. MEDICATIONS: Ancef 2 g IV was administered within 1  hour of the procedure. ANESTHESIA/SEDATION: General anesthesia. CONTRAST:  Omnipaque  300 approximately 90 mL. FLUOROSCOPY TIME:  Fluoroscopy Time: 79 minutes 18 seconds (2411 mGy). COMPLICATIONS: None immediate. TECHNIQUE: Following a full explanation of the procedure along with the potential associated complications, an informed witnessed consent was obtained. The risks of intracranial hemorrhage of 10%, worsening neurological deficit, ventilator dependency, death and inability to revascularize were all reviewed in detail with the patient's nephew. The patient was then put under general anesthesia by the Department of Anesthesiology at [facility]. The right groin was prepped and draped in the usual sterile fashion. Thereafter using modified Seldinger technique, transfemoral access into the right common femoral artery was obtained without difficulty. Over an 0.035 inch guidewire an 8 French 25 cm Pinnacle sheath was inserted. Through this, and also over an 0.035 inch guidewire a combination of an 087 balloon guide catheter with a 125 cm 6 French Berenstein support catheter was advanced to the aortic arch region, and selectively positioned in the left common carotid artery and the distal left internal carotid artery. FINDINGS: The left common carotid arteriogram demonstrates the left external carotid artery and its major branches to be widely patent. The left internal carotid artery at the bulb has a smooth shallow plaque along the posterior wall with a less than 20% stenosis by the NASCET criteria. No evidence of intraluminal filling defects, or ulcerations evident. More distally, the vessel is seen to opacify to the cranial skull base. The petrous, the cavernous and the supraclinoid left ICA demonstrate wide patency. Left anterior cerebral artery demonstrates a moderate stenosis at its origin. Its branches opacify into the capillary and venous phases. The left middle cerebral artery is occluded just at its  origin. PROCEDURE: Through the balloon guide catheter in the distal left ICA, a combination of an 062 aspiration catheter with an 021 160 cm Trevo ProVue microcatheter was advanced over an 018 inch micro guidewire to the supraclinoid left ICA. The micro guidewire was then gently probed into the occluded left middle cerebral artery for distal access. The 018 inch Aristotle micro guidewire with a moderate J configuration was then replaced with an Aristotle 014 micro guidewire with a J-tip configuration, this was then advanced into the occluded proximal left middle cerebral artery followed by the microcatheter. With a gentle maneuvering of the micro guidewire using a torque device, access was then obtained into the superior division followed by the microcatheter. The guidewire was removed. Good aspiration obtained from the hub of  the microcatheter. A gentle control arteriogram performed through this demonstrated safe positioning of the tip of the microcatheter which was then connected to continuous heparinized saline infusion. A 4 mm x 40 mm Solitaire X retrieval device was then deployed from the mid M2 segment of the superior division to just proximal to the occluded left M1 segment. A control arteriogram performed through the 062 aspiration catheter in the distal left ICA demonstrated patency of the left middle cerebral artery territory retrieving a TICI 2C revascularization. Also uncovered was segmental area of severe atherosclerotic stenosis involving the left M1 segment. Decompressed superior and inferior division branches were also evident. The retriever was then captured by advancing the microcatheter into the distal M2 segment of the superior division. A control arteriogram performed through this demonstrated safe positioning of the tip of the microcatheter. The microcatheter was then exchanged for an 014 inch 300 cm Zoom exchange micro guidewire with a moderate J configuration which was maintained in the  distal M2 M3 region while the microcatheter was removed. Over this, a 2 mm x 9 mm Gateway angioplasty microcatheter which had been prepped with 50% contrast and 50% heparinized saline infusion was advanced without difficulty through an intermediate catheter and positioned covering the entirety of the M1 segment of the left middle cerebral artery severely stenotic due to intracranial arteriosclerosis. A control angioplasty was then performed to 6 atmospheres achieving a diameter of approximately 1.8 mm where it was maintained for approximately 2 minutes. Thereafter, the balloon was deflated and retrieved and removed proximally. A control arteriogram performed through the intermediate catheter in the distal left ICA demonstrated free flow through the left M1 segment into the left MCA branches maintaining a TICI 2C revascularization. However, a control arteriogram performed a few minutes later demonstrated near complete occlusion due to underlying severe intracranial arteriosclerosis. Due to the reocclusion of the left M1 segment, a 2 mm x 12 mm Onyx Frontier balloon mounted stent was chosen for stent placement at the site of the M1 atherosclerotic disease. The balloon mounted stent was prepped with 50% contrast and 50% heparinized saline infusion antegradely and with heparinized saline infusion retrogradely. Using the rapid exchange technique, the stent delivery apparatus was advanced distally into the supraclinoid left ICA without any difficulty with the distal wire being maintained stable. The proximal and the distal markers of the stent were then aligned at the origin of the left M1 segment, and at the bifurcation of the M1 segment. Control inflation was then performed using a micro inflation syringe device via micro tubing to about 1.85 mm where it was maintained for approximately a minute and a half. Thereafter, the balloon was deflated and retrieved and removed. Control arteriograms were then performed at 10, 20  and 35 minutes post stent deployment which continued to demonstrate excellent flow through the left MCA stented segment and with maintenance of 2C revascularization of the distal MCA branches. Prior to the final arteriogram, the micro guidewire was then gently retrieved and removed without any entanglement seen with the left MCA stent. Spasm noted in the left ICA was treated with 2 aliquots of 25 mcg of nitroglycerin . Final control arteriogram performed through the balloon guide catheter in the proximal left ICA demonstrated wide patency of the left internal carotid artery extra cranially and intracranially. Again moderate spasm in the mid cervical left ICA responded to 25 mcg nitroglycerin . Manual compression with quick clot was held at the right groin puncture site due to underlying significant atherosclerotic disease involving the common  femoral artery. Distal pulses remained Dopplerable feebly bilaterally, with no change in the absent dorsalis pedis pulses bilaterally as prior to the procedure. Flat panel CT of the brain demonstrated no evidence of intracranial hemorrhage. Other areas of contrast stain were evident in the basal ganglia and the left parietal cortical subcortical areas. The patient was left intubated due to her medical condition and transferred to the neuro ICU for post revascularization care. Medications utilized: Aspirin  81 mg and Brilinta  90 mg via orogastric tube prior to the intra intracranial angioplasty. Half bolus dose of cangrelor  followed by a 4 hour half dose infusion following placement of the balloon mounted stent. IMPRESSION: Status post endovascular complete revascularization of occluded left middle cerebral M1 segment, with 1 pass with a 4 mm x 40 mm Solitaire X retrieval device, and balloon angioplasty of the occluded left middle cerebral M1 segment secondary to intracranial arteriosclerosis. Stenting of the left middle cerebral artery M1 segment using a 2 mm x 12 mm anterior  balloon mounted stent with restoration of TICI 2C revascularization. PLAN: Follow-up in the clinic 4 weeks post discharge. Electronically Signed   By: Thyra Nash M.D.   On: 04/18/2023 08:14   IR CT Head Ltd Result Date: 04/18/2023 INDICATION: Worsening aphasia and right-sided weakness. Right-sided neglect. Occlusion of the left middle cerebral artery on CT angiogram of the head and neck. EXAM: 1. EMERGENT LARGE VESSEL OCCLUSION THROMBOLYSIS (anterior CIRCULATION) COMPARISON:  CT angiogram of the head and neck of April 16, 2023. MEDICATIONS: Ancef 2 g IV was administered within 1 hour of the procedure. ANESTHESIA/SEDATION: General anesthesia. CONTRAST:  Omnipaque  300 approximately 90 mL. FLUOROSCOPY TIME:  Fluoroscopy Time: 79 minutes 18 seconds (2411 mGy). COMPLICATIONS: None immediate. TECHNIQUE: Following a full explanation of the procedure along with the potential associated complications, an informed witnessed consent was obtained. The risks of intracranial hemorrhage of 10%, worsening neurological deficit, ventilator dependency, death and inability to revascularize were all reviewed in detail with the patient's nephew. The patient was then put under general anesthesia by the Department of Anesthesiology at [facility]. The right groin was prepped and draped in the usual sterile fashion. Thereafter using modified Seldinger technique, transfemoral access into the right common femoral artery was obtained without difficulty. Over an 0.035 inch guidewire an 8 French 25 cm Pinnacle sheath was inserted. Through this, and also over an 0.035 inch guidewire a combination of an 087 balloon guide catheter with a 125 cm 6 French Berenstein support catheter was advanced to the aortic arch region, and selectively positioned in the left common carotid artery and the distal left internal carotid artery. FINDINGS: The left common carotid arteriogram demonstrates the left external carotid artery and its major  branches to be widely patent. The left internal carotid artery at the bulb has a smooth shallow plaque along the posterior wall with a less than 20% stenosis by the NASCET criteria. No evidence of intraluminal filling defects, or ulcerations evident. More distally, the vessel is seen to opacify to the cranial skull base. The petrous, the cavernous and the supraclinoid left ICA demonstrate wide patency. Left anterior cerebral artery demonstrates a moderate stenosis at its origin. Its branches opacify into the capillary and venous phases. The left middle cerebral artery is occluded just at its origin. PROCEDURE: Through the balloon guide catheter in the distal left ICA, a combination of an 062 aspiration catheter with an 021 160 cm Trevo ProVue microcatheter was advanced over an 018 inch micro guidewire to the supraclinoid left  ICA. The micro guidewire was then gently probed into the occluded left middle cerebral artery for distal access. The 018 inch Aristotle micro guidewire with a moderate J configuration was then replaced with an Aristotle 014 micro guidewire with a J-tip configuration, this was then advanced into the occluded proximal left middle cerebral artery followed by the microcatheter. With a gentle maneuvering of the micro guidewire using a torque device, access was then obtained into the superior division followed by the microcatheter. The guidewire was removed. Good aspiration obtained from the hub of the microcatheter. A gentle control arteriogram performed through this demonstrated safe positioning of the tip of the microcatheter which was then connected to continuous heparinized saline infusion. A 4 mm x 40 mm Solitaire X retrieval device was then deployed from the mid M2 segment of the superior division to just proximal to the occluded left M1 segment. A control arteriogram performed through the 062 aspiration catheter in the distal left ICA demonstrated patency of the left middle cerebral artery  territory retrieving a TICI 2C revascularization. Also uncovered was segmental area of severe atherosclerotic stenosis involving the left M1 segment. Decompressed superior and inferior division branches were also evident. The retriever was then captured by advancing the microcatheter into the distal M2 segment of the superior division. A control arteriogram performed through this demonstrated safe positioning of the tip of the microcatheter. The microcatheter was then exchanged for an 014 inch 300 cm Zoom exchange micro guidewire with a moderate J configuration which was maintained in the distal M2 M3 region while the microcatheter was removed. Over this, a 2 mm x 9 mm Gateway angioplasty microcatheter which had been prepped with 50% contrast and 50% heparinized saline infusion was advanced without difficulty through an intermediate catheter and positioned covering the entirety of the M1 segment of the left middle cerebral artery severely stenotic due to intracranial arteriosclerosis. A control angioplasty was then performed to 6 atmospheres achieving a diameter of approximately 1.8 mm where it was maintained for approximately 2 minutes. Thereafter, the balloon was deflated and retrieved and removed proximally. A control arteriogram performed through the intermediate catheter in the distal left ICA demonstrated free flow through the left M1 segment into the left MCA branches maintaining a TICI 2C revascularization. However, a control arteriogram performed a few minutes later demonstrated near complete occlusion due to underlying severe intracranial arteriosclerosis. Due to the reocclusion of the left M1 segment, a 2 mm x 12 mm Onyx Frontier balloon mounted stent was chosen for stent placement at the site of the M1 atherosclerotic disease. The balloon mounted stent was prepped with 50% contrast and 50% heparinized saline infusion antegradely and with heparinized saline infusion retrogradely. Using the rapid exchange  technique, the stent delivery apparatus was advanced distally into the supraclinoid left ICA without any difficulty with the distal wire being maintained stable. The proximal and the distal markers of the stent were then aligned at the origin of the left M1 segment, and at the bifurcation of the M1 segment. Control inflation was then performed using a micro inflation syringe device via micro tubing to about 1.85 mm where it was maintained for approximately a minute and a half. Thereafter, the balloon was deflated and retrieved and removed. Control arteriograms were then performed at 10, 20 and 35 minutes post stent deployment which continued to demonstrate excellent flow through the left MCA stented segment and with maintenance of 2C revascularization of the distal MCA branches. Prior to the final arteriogram, the micro guidewire was  then gently retrieved and removed without any entanglement seen with the left MCA stent. Spasm noted in the left ICA was treated with 2 aliquots of 25 mcg of nitroglycerin . Final control arteriogram performed through the balloon guide catheter in the proximal left ICA demonstrated wide patency of the left internal carotid artery extra cranially and intracranially. Again moderate spasm in the mid cervical left ICA responded to 25 mcg nitroglycerin . 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 Distal pulses remained Dopplerable feebly bilaterally, with no change in the absent dorsalis pedis pulses bilaterally as prior to the procedure. Flat panel CT of the brain demonstrated no evidence of intracranial hemorrhage. Other areas of contrast stain were evident in the basal ganglia and the left parietal cortical subcortical areas. The patient was left intubated due to her medical condition and transferred to the neuro ICU for post revascularization care. Medications  utilized: Aspirin  81 mg and Brilinta  90 mg via orogastric tube prior to the intra intracranial angioplasty. Half bolus dose of cangrelor  followed by a 4 hour half dose infusion following placement of the balloon mounted stent. IMPRESSION: Status post endovascular complete revascularization of occluded left middle cerebral M1 segment, with 1 pass with a 4 mm x 40 mm Solitaire X retrieval device, and balloon angioplasty of the occluded left middle cerebral M1 segment secondary to intracranial arteriosclerosis. Stenting of the left middle cerebral artery M1 segment using a 2 mm x 12 mm anterior balloon mounted stent with restoration of TICI 2C revascularization. PLAN: Follow-up in the clinic 4 weeks post discharge. Electronically Signed   By: Thyra Nash M.D.   On: 04/18/2023 08:14   MR BRAIN WO CONTRAST Result Date: 04/18/2023 CLINICAL DATA:  Stroke, follow up; Neuro deficit, acute, stroke suspected EXAM: MRI HEAD WITHOUT CONTRAST MRA HEAD WITHOUT CONTRAST TECHNIQUE: Multiplanar, multi-echo pulse sequences of the brain and surrounding structures were acquired without intravenous contrast. Angiographic images of the Circle of Willis were acquired using MRA technique without intravenous contrast. COMPARISON:  CT head 04/16/2023. CTA head/neck 04/16/2023. FINDINGS: MRI HEAD FINDINGS Brain: Acute left MCA territory infarct which involves the left basal ganglia, overlying left frontal lobe, left parietal lobe and anterior left temporal lobe. Intraparenchymal hemorrhage centered in the left basal ganglia appears similar when comparing across modalities to recent CT head. Similar mild rightward midline shift. No visible mass lesion. No hydrocephalus. Remote left cerebellar infarcts. Vascular: See below. Skull and upper cervical spine: Normal marrow signal. Sinuses/Orbits: Mild paranasal sinus mucosal thickening. No acute orbital findings. Other: No mastoid effusions. MRA HEAD FINDINGS Anterior circulation:  Bilateral intracranial ICAs, MCAs and ACAs are patent without proximal hemodynamically significant stenosis. There is a stent in the left M1 MCA which obscures the vessel in this region. No aneurysm identified. Posterior circulation: Bilateral intradural vertebral arteries, basilar artery and bilateral posterior cerebral arteries are patent without proximal hemodynamically significant stenosis. No aneurysm identified. IMPRESSION: 1. Acute left MCA territory infarct with similar intraparenchymal hemorrhage centered in the left basal ganglia when comparing across modalities to recent CT head. Similar mild rightward midline shift. 2. Left M1 MCA is now patent.  No large vessel occlusion. Electronically Signed   By: Gilmore GORMAN Molt M.D.   On: 04/18/2023 01:28   MR ANGIO HEAD WO CONTRAST Result Date: 04/18/2023 CLINICAL DATA:  Stroke, follow up; Neuro deficit, acute, stroke suspected EXAM: MRI HEAD WITHOUT CONTRAST MRA HEAD WITHOUT CONTRAST TECHNIQUE: Multiplanar, multi-echo  pulse sequences of the brain and surrounding structures were acquired without intravenous contrast. Angiographic images of the Circle of Willis were acquired using MRA technique without intravenous contrast. COMPARISON:  CT head 04/16/2023. CTA head/neck 04/16/2023. FINDINGS: MRI HEAD FINDINGS Brain: Acute left MCA territory infarct which involves the left basal ganglia, overlying left frontal lobe, left parietal lobe and anterior left temporal lobe. Intraparenchymal hemorrhage centered in the left basal ganglia appears similar when comparing across modalities to recent CT head. Similar mild rightward midline shift. No visible mass lesion. No hydrocephalus. Remote left cerebellar infarcts. Vascular: See below. Skull and upper cervical spine: Normal marrow signal. Sinuses/Orbits: Mild paranasal sinus mucosal thickening. No acute orbital findings. Other: No mastoid effusions. MRA HEAD FINDINGS Anterior circulation: Bilateral intracranial ICAs,  MCAs and ACAs are patent without proximal hemodynamically significant stenosis. There is a stent in the left M1 MCA which obscures the vessel in this region. No aneurysm identified. Posterior circulation: Bilateral intradural vertebral arteries, basilar artery and bilateral posterior cerebral arteries are patent without proximal hemodynamically significant stenosis. No aneurysm identified. IMPRESSION: 1. Acute left MCA territory infarct with similar intraparenchymal hemorrhage centered in the left basal ganglia when comparing across modalities to recent CT head. Similar mild rightward midline shift. 2. Left M1 MCA is now patent.  No large vessel occlusion. Electronically Signed   By: Gilmore GORMAN Molt M.D.   On: 04/18/2023 01:28   DG Abd Portable 1V Result Date: 04/17/2023 CLINICAL DATA:  Bowel impaction EXAM: PORTABLE ABDOMEN - 1 VIEW COMPARISON:  04/16/2023 FINDINGS: The patient is rotated to the right on today's radiograph, reducing diagnostic sensitivity and specificity. Previous nasogastric tube no longer visible along the abdomen. No visible feeding tube. There is scattered gas in the colon without a substantial burden of colonic stool. No dilated small bowel is identified. IMPRESSION: 1. No substantial burden of colonic stool is identified. 2. Previous nasogastric tube no longer visible along the abdomen. Electronically Signed   By: Ryan Salvage M.D.   On: 04/17/2023 18:43   CT HEAD WO CONTRAST ( ) Result Date: 04/16/2023 CLINICAL DATA:  Stroke, follow-up EXAM: CT HEAD WITHOUT CONTRAST TECHNIQUE: Contiguous axial images were obtained from the base of the skull through the vertex without intravenous contrast. RADIATION DOSE REDUCTION: This exam was performed according to the departmental dose-optimization program which includes automated exposure control, adjustment of the mA and/or kV according to patient size and/or use of iterative reconstruction technique. COMPARISON:  04/16/2023 CT head  10:48 a.m., 04/16/2023 IR CT head FINDINGS: Brain: Hyperdense material area around the left M1, which correlates with area of hypodensity on the prior CT head. No evidence of additional acute infarct, mass, mass effect, or midline shift. No hydrocephalus. Gray-white differentiation is preserved. The basilar cisterns are patent. Vascular: Status post interval placement of a left M1 stent. Some contrast is noted within the vasculature. Skull: Negative for fracture or focal lesion. Sinuses/Orbits: Mucosal thickening in the sphenoid sinuses and ethmoid air cells. No acute finding in the orbits. Other: The mastoid air cells are well aerated. IMPRESSION: 1. Hyperdense material around the left M1, which correlates with area of hypodensity on the prior CT head, favored to represent a combination of petechial hemorrhage and contrast staining within the area of prior infarct. Heidelberg classification 1b: HI2, confluent petechiae, no mass effect. 2. Status post interval placement of a left M1 stent. Electronically Signed   By: Donald Campion M.D.   On: 04/16/2023 18:47   DG Abd 1 View Result Date: 04/16/2023 CLINICAL  DATA:  Feeding tube placement EXAM: ABDOMEN - 1 VIEW COMPARISON:  04/14/2023 FINDINGS: No feeding tube is visible projecting over the abdomen. However, there is a nasogastric tube with tip in the stomach body. Contrast medium noted in the renal collecting systems with faint visibility of the renal parenchyma as well. Given the renal parenchymal persistent nephrogram, the possibility of renal dysfunction might be considered. IMPRESSION: 1. Nasogastric tube with tip in the stomach body. 2. Contrast medium in the renal collecting systems with faint visibility of the renal parenchyma as well. Given the renal parenchymal persistent nephrogram, the possibility of renal dysfunction might be considered. Electronically Signed   By: Ryan Salvage M.D.   On: 04/16/2023 18:17   Portable Chest x-ray Result Date:  04/16/2023 CLINICAL DATA:  Intubation. EXAM: PORTABLE CHEST 1 VIEW COMPARISON:  Radiographs 12/05/2022.  Cardiac CT 12/20/2016. FINDINGS: 1613 hours. Tip of the endotracheal tube overlies the mid trachea. Enteric tube projects just below the diaphragm with the side hole above the expected location of the gastroesophageal junction. The heart size and mediastinal contours are normal. The lungs appear clear. There is no pleural effusion or pneumothorax. The bones appear unremarkable. IMPRESSION: 1. Satisfactory position of endotracheal tube. 2. Enteric tube side hole above the expected location of the gastroesophageal junction. Recommend advancement. 3. No evidence of acute cardiopulmonary process. Electronically Signed   By: Elsie Perone M.D.   On: 04/16/2023 16:36   CT ANGIO HEAD NECK W WO CM (CODE STROKE) Result Date: 04/16/2023 CLINICAL DATA:  Neuro deficit, acute, stroke suspected. Worsening aphasia, recent near occlusive L M1 on CTA. EXAM: CT ANGIOGRAPHY HEAD AND NECK WITH AND WITHOUT CONTRAST TECHNIQUE: Multidetector CT imaging of the head and neck was performed using the standard protocol during bolus administration of intravenous contrast. Multiplanar CT image reconstructions and MIPs were obtained to evaluate the vascular anatomy. Carotid stenosis measurements (when applicable) are obtained utilizing NASCET criteria, using the distal internal carotid diameter as the denominator. RADIATION DOSE REDUCTION: This exam was performed according to the departmental dose-optimization program which includes automated exposure control, adjustment of the mA and/or kV according to patient size and/or use of iterative reconstruction technique. CONTRAST:  75mL OMNIPAQUE  IOHEXOL  350 MG/ML SOLN COMPARISON:  CTA head and neck 04/12/2023 FINDINGS: CTA NECK FINDINGS Aortic arch: Normal variant aortic arch branching pattern with common origin of the brachiocephalic and left common carotid arteries. Widely patent arch vessel  origins. Right carotid system: Patent without evidence a significant stenosis or dissection. Left carotid system: Patent without evidence of a significant stenosis or dissection. Mild atheromatous irregularity of the carotid bulb. Vertebral arteries: Patent without evidence of a significant stenosis or dissection. Dominant right vertebral artery. Skeleton: Mild-to-moderate cervical spondylosis. Other neck: No evidence of cervical lymphadenopathy or mass. Upper chest: Clear lung apices. Review of the MIP images confirms the above findings CTA HEAD FINDINGS Anterior circulation: The internal carotid arteries are widely patent from skull base to carotid termini. There is occlusion of the proximal left M1 segment with worsening, now only minimal collateralization of left MCA branch vessels. The right MCA and both ACAs are patent. A moderate proximal right M1 stenosis is more prominent than on the prior. No aneurysm is identified. Posterior circulation: The intracranial vertebral arteries are widely patent to the basilar. Patent PICA, AICA, and SCA origins are visualized bilaterally. The basilar artery is widely patent. The basilar artery is crash that posterior communicating arteries are diminutive or absent. Both PCAs are patent without evidence of a significant  proximal stenosis. No aneurysm is identified. Venous sinuses: Poorly evaluated due to contrast timing. Anatomic variants: None. Review of the MIP images confirms the above findings Emergent findings were communicated by telephone to Dr. Matthews on 04/16/2023 at 11:34 a.m. IMPRESSION: 1. Proximal left M1 occlusion with only minimal collateralization of left MCA branch vessels, progressed from 04/12/2023. 2. Moderate proximal right M1 stenosis. 3. Widely patent cervical carotid and vertebral arteries. Electronically Signed   By: Dasie Hamburg M.D.   On: 04/16/2023 11:47   CT HEAD CODE STROKE WO CONTRAST` Result Date: 04/16/2023 CLINICAL DATA:  Code stroke.   Delirium. EXAM: CT HEAD WITHOUT CONTRAST TECHNIQUE: Contiguous axial images were obtained from the base of the skull through the vertex without intravenous contrast. RADIATION DOSE REDUCTION: This exam was performed according to the departmental dose-optimization program which includes automated exposure control, adjustment of the mA and/or kV according to patient size and/or use of iterative reconstruction technique. COMPARISON:  CT head without contrast 04/12/2023. MR head without contrast 04/12/2023. FINDINGS: Brain: Expected evolution of recently seen ischemic infarcts in the left MCA territory noted. No definite new infarcts are present. No significant hemorrhage is present. There is some effacement of the sulci without midline shift or significant mass effect on the ventricle. The deep gray nuclei are intact. The brainstem and cerebellum are within normal limits. Midline structures are within normal limits. Vascular: No hyperdense vessel or unexpected calcification. Skull: Calvarium is intact. No focal lytic or blastic lesions are present. No significant extracranial soft tissue lesion is present. Sinuses/Orbits: The paranasal sinuses and mastoid air cells are clear. ASPECTS [facility] Stroke Program Early CT Score) - Ganglionic level infarction (caudate, lentiform nuclei, internal capsule, insula, M1-M3 cortex): 7/7 - Supraganglionic infarction (M4-M6 cortex): 3/3 Total score (0-10 with 10 being normal): 10/10 IMPRESSION: 1. Expected evolution of recently seen ischemic infarcts in the left MCA territory. 2. No definite new infarcts are present. 3. No significant hemorrhage. 4. Aspects is 10/10. The above was relayed via text pager to Dr. Matthews on 04/16/2023 at 11:01 . Electronically Signed   By: Lonni Necessary M.D.   On: 04/16/2023 11:01   DG Abd Portable 1V Result Date: 04/14/2023 CLINICAL DATA:  Abdominal pain. EXAM: PORTABLE ABDOMEN - 1 VIEW COMPARISON:  CT 05/30/2018 FINDINGS: No bowel dilatation to  suggest obstruction. Air within prominent but not abnormally dilated transverse and descending colon. Formed stool in the ascending colon. No abnormal rectal distention. Pelvic phleboliths. No abdominal radiopaque calculi. On limited assessment, no acute osseous findings. IMPRESSION: Normal bowel gas pattern.  No explanation for pain. Electronically Signed   By: Andrea Gasman M.D.   On: 04/14/2023 15:03   ECHOCARDIOGRAM COMPLETE Result Date: 04/13/2023    ECHOCARDIOGRAM REPORT   Patient Name:   Walta [place]ville Date of Exam: 04/13/2023 Medical Rec #:  [id number]             Height:       62.0 in Accession #:    [id number]            Weight:       160.9 lb Date of Birth:  04-20-1965             BSA:          1.743 m Patient Age:    57 years              BP:           171/73 mmHg Patient Gender: F  HR:           60 bpm. Exam Location:  Inpatient Procedure: 2D Echo, Cardiac Doppler and Color Doppler Indications:    Stroke  History:        Patient has prior history of Echocardiogram examinations, most                 recent 12/07/2022. Stroke, Arrythmias:Tachycardia; Risk                 Factors:Hypertension, Former Smoker and Dyslipidemia.  Sonographer:    Juanita Shaw Referring Phys: IVONNE MUSTACHE IMPRESSIONS  1. Mild mid cavitary gradient. Peak velocity 0.94 m/s. Peak gradient 3.5 mmHg. With Valsalva, velocity increases to 1.2 m/s and peak gradient 5.6 mmHg. Left ventricular ejection fraction, by estimation, is 60 to 65%. The left ventricle has normal function. The left ventricle has no regional wall motion abnormalities. There is severe left ventricular hypertrophy. Left ventricular diastolic parameters are consistent with Grade I diastolic dysfunction (impaired relaxation).  2. Right ventricular systolic function is normal. The right ventricular size is normal.  3. Left atrial size was moderately dilated.  4. The mitral valve is normal in structure. No evidence of mitral valve  regurgitation. No evidence of mitral stenosis.  5. The aortic valve is normal in structure. Aortic valve regurgitation is not visualized. No aortic stenosis is present.  6. The inferior vena cava is normal in size with greater than 50% respiratory variability, suggesting right atrial pressure of 3 mmHg. FINDINGS  Left Ventricle: Mild mid cavitary gradient. Peak velocity 0.94 m/s. Peak gradient 3.5 mmHg. With Valsalva, velocity increases to 1.2 m/s and peak gradient 5.6 mmHg. Left ventricular ejection fraction, by estimation, is 60 to 65%. The left ventricle has normal function. The left ventricle has no regional wall motion abnormalities. The left ventricular internal cavity size was normal in size. There is severe left ventricular hypertrophy. Left ventricular diastolic parameters are consistent with Grade I diastolic dysfunction (impaired relaxation). Normal left ventricular filling pressure. Right Ventricle: The right ventricular size is normal. No increase in right ventricular wall thickness. Right ventricular systolic function is normal. Left Atrium: Left atrial size was moderately dilated. Right Atrium: Right atrial size was normal in size. Pericardium: There is no evidence of pericardial effusion. Mitral Valve: The mitral valve is normal in structure. No evidence of mitral valve regurgitation. No evidence of mitral valve stenosis. MV peak gradient, 3.9 mmHg. The mean mitral valve gradient is 1.0 mmHg. Tricuspid Valve: The tricuspid valve is normal in structure. Tricuspid valve regurgitation is not demonstrated. No evidence of tricuspid stenosis. Aortic Valve: The aortic valve is normal in structure. Aortic valve regurgitation is not visualized. No aortic stenosis is present. Aortic valve mean gradient measures 3.0 mmHg. Aortic valve peak gradient measures 6.6 mmHg. Aortic valve area, by VTI measures 1.58 cm. Pulmonic Valve: The pulmonic valve was normal in structure. Pulmonic valve regurgitation is not  visualized. No evidence of pulmonic stenosis. Aorta: The aortic root is normal in size and structure. Venous: The inferior vena cava is normal in size with greater than 50% respiratory variability, suggesting right atrial pressure of 3 mmHg. IAS/Shunts: No atrial level shunt detected by color flow Doppler.  LEFT VENTRICLE PLAX 2D LVIDd:         3.40 cm      Diastology LVIDs:         2.10 cm      LV e' medial:    6.42 cm/s LV PW:  1.30 cm      LV E/e' medial:  10.3 LV IVS:        1.80 cm      LV e' lateral:   6.96 cm/s LVOT diam:     1.60 cm      LV E/e' lateral: 9.5 LV SV:         35 LV SV Index:   20 LVOT Area:     2.01 cm  LV Volumes (MOD) LV vol d, MOD A2C: 106.0 ml LV vol d, MOD A4C: 140.0 ml LV vol s, MOD A2C: 38.6 ml LV vol s, MOD A4C: 42.7 ml LV SV MOD A2C:     67.4 ml LV SV MOD A4C:     140.0 ml LV SV MOD BP:      79.7 ml RIGHT VENTRICLE             IVC RV Basal diam:  2.90 cm     IVC diam: 1.40 cm RV Mid diam:    1.60 cm RV S prime:     11.90 cm/s TAPSE (M-mode): 2.4 cm LEFT ATRIUM             Index        RIGHT ATRIUM          Index LA diam:        3.70 cm 2.12 cm/m   RA Area:     7.30 cm LA Vol (A2C):   17.1 ml 9.81 ml/m   RA Volume:   11.70 ml 6.71 ml/m LA Vol (A4C):   61.0 ml 35.00 ml/m LA Biplane Vol: 34.4 ml 19.74 ml/m  AORTIC VALVE                    PULMONIC VALVE AV Area (Vmax):    1.96 cm     PV Vmax:       0.75 m/s AV Area (Vmean):   1.41 cm     PV Peak grad:  2.3 mmHg AV Area (VTI):     1.58 cm AV Vmax:           128.00 cm/s AV Vmean:          82.200 cm/s AV VTI:            0.224 m AV Peak Grad:      6.6 mmHg AV Mean Grad:      3.0 mmHg LVOT Vmax:         125.00 cm/s LVOT Vmean:        57.600 cm/s LVOT VTI:          0.176 m LVOT/AV VTI ratio: 0.79  AORTA Ao Root diam: 3.10 cm Ao Asc diam:  2.60 cm MITRAL VALVE MV Area (PHT): 2.59 cm    SHUNTS MV Area VTI:   1.44 cm    Systemic VTI:  0.18 m MV Peak grad:  3.9 mmHg    Systemic Diam: 1.60 cm MV Mean grad:  1.0 mmHg MV Vmax:        0.98 m/s MV Vmean:      55.4 cm/s MV Decel Time: 293 msec MV E velocity: 65.90 cm/s MV A velocity: 91.30 cm/s MV E/A ratio:  0.72 Annabella Scarce MD Electronically signed by Annabella Scarce MD Signature Date/Time: 04/13/2023/4:04:49 PM    Final    MR Brain Wo Contrast (neuro protocol) Result Date: 04/12/2023 CLINICAL DATA:  Initial evaluation for acute neuro deficit, stroke suspected. EXAM: MRI HEAD WITHOUT CONTRAST TECHNIQUE:  Multiplanar, multiecho pulse sequences of the brain and surrounding structures were obtained without intravenous contrast. COMPARISON:  Prior CT from earlier the same day. FINDINGS: Brain: Cerebral volume within normal limits. No significant cerebral white matter disease for age. Chronic left cerebellar infarct noted. Scattered foci of restricted diffusion are seen involving the left cerebral hemisphere, corresponding with hypodensity seen on prior CT. Findings consistent with acute to early subacute ischemic infarcts, left MCA distribution. No associated hemorrhage or significant mass effect. There are a few underlying late subacute to chronic infarcts superimposed on this region as well (series 5, image 81, 79 for example). No other evidence for acute or subacute ischemia. No acute intracranial hemorrhage. No mass lesion or midline shift. No hydrocephalus or extra-axial fluid collection. Pituitary gland within normal limits. Vascular: Poor visualization of the left MCA branches, corresponding with attenuated flow noted on prior CT. Major intracranial vascular flow voids are otherwise maintained. Skull and upper cervical spine: Craniocervical junction within normal limits. Bone marrow signal intensity normal. No scalp soft tissue abnormality. Sinuses/Orbits: Globes orbital soft tissues demonstrate no acute finding. Paranasal sinuses are clear. No mastoid effusion. Other: Few subcentimeter T2 hyperintense nodular densities noted about the parotid glands, nonspecific, but possibly small  intraparotid lymph nodes. IMPRESSION: 1. Scattered acute to early subacute ischemic infarcts involving the left cerebral hemisphere, left MCA distribution. No associated hemorrhage or significant mass effect. 2. Few scattered superimposed late subacute to chronic left MCA distribution infarcts. 3. Chronic left cerebellar infarct. Electronically Signed   By: Morene Hoard M.D.   On: 04/12/2023 19:28   CT ANGIO HEAD NECK W WO CM Result Date: 04/12/2023 CLINICAL DATA:  Loss of cognitive skills, slurred speech EXAM: CT ANGIOGRAPHY HEAD AND NECK WITH AND WITHOUT CONTRAST TECHNIQUE: Multidetector CT imaging of the head and neck was performed using the standard protocol during bolus administration of intravenous contrast. Multiplanar CT image reconstructions and MIPs were obtained to evaluate the vascular anatomy. Carotid stenosis measurements (when applicable) are obtained utilizing NASCET criteria, using the distal internal carotid diameter as the denominator. RADIATION DOSE REDUCTION: This exam was performed according to the departmental dose-optimization program which includes automated exposure control, adjustment of the mA and/or kV according to patient size and/or use of iterative reconstruction technique. CONTRAST:  75mL OMNIPAQUE  IOHEXOL  350 MG/ML SOLN COMPARISON:  No prior CTA available, correlation is made with 03/05/2018 CT head and 12/06/2016 MRA head FINDINGS: CT HEAD FINDINGS Brain: Areas of hypodensity left cerebral hemisphere (series 2, images 17, 19, 21, and 22), which are new from the prior exam and concerning for acute to subacute infarcts. No evidence of acute hemorrhage, mass, mass effect, or midline shift. No hydrocephalus or extra-axial fluid collection. Vascular: No hyperdense vessel. Skull: Negative for fracture or focal lesion. Sinuses/Orbits: No acute finding. Other: The mastoid air cells are well aerated. CTA NECK FINDINGS Aortic arch: Two-vessel arch with a common origin of the  brachiocephalic and left common carotid arteries. Imaged portion shows no evidence of aneurysm or dissection. No significant stenosis of the major arch vessel origins. Right carotid system: No evidence of dissection, occlusion, or hemodynamically significant stenosis (greater than 50%). Left carotid system: No evidence of dissection, occlusion, or hemodynamically significant stenosis (greater than 50%). Vertebral arteries: No evidence of dissection, occlusion, or hemodynamically significant stenosis (greater than 50%). Skeleton: No acute osseous abnormality. Degenerative changes in the cervical spine. Other neck: 3 mm hypoenhancing focus in the right thyroid  lobe, for which no follow-up is currently indicated. Upper chest: No focal  pulmonary opacity or pleural effusion. Review of the MIP images confirms the above findings CTA HEAD FINDINGS Evaluation is somewhat limited by venous contamination. Anterior circulation: Both internal carotid arteries are patent to the termini, without significant stenosis. A1 segments patent. Normal anterior communicating artery. Anterior cerebral arteries are patent to their distal aspects without significant stenosis. Short segment occlusion or near occlusive stenosis in left M1 (series 605, images 126-128), with possible reconstitution in the proximal more posterior left M2 (series 605, image 126). The anterior M2 is not well seen. Additional severe stenosis in the insular left M2 (series 605, image 121 and 124), with overall poor opacification distal left MCA branches. Mild stenosis in the proximal right M 1 (series 605, image 129). Right MCA branches are irregular but grossly perfused without high-grade stenosis. Posterior circulation: Vertebral arteries patent to the vertebrobasilar junction without significant stenosis. Basilar patent to its distal aspect without significant stenosis. Superior cerebellar arteries patent proximally. Patent P1 segments. PCAs perfused to their  distal aspects without significant stenosis. The bilateral posterior communicating arteries are not visualized. Venous sinuses: Well opacified, patent. Anatomic variants: None significant. No evidence of aneurysm or vascular malformation. Review of the MIP images confirms the above findings IMPRESSION: 1. Areas of hypodensity in the left cerebral hemisphere, which are new from the prior exam and concerning for acute to subacute infarcts. An MRI is recommended for further evaluation. 2. 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 The anterior M2 is not well seen. Additional severe stenosis in the insular left M2, with overall poor opacification of distal left MCA branches. 3. No hemodynamically significant stenosis in the neck. These results were called by telephone at the time of interpretation on 04/12/2023 at 2:53 pm to provider BERNARDA PEREYRA , who verbally acknowledged these results. Electronically Signed   By: Donald Campion M.D.   On: 04/12/2023 14:54    Assessment: 58 y.o. female with PMH of stroke, HTN, HLD, neuropathy, MM was recently admitted for left MCA stroke. Found to have left MCA occlusion and received mechanical thrombectomy with rescue stenting. Later she was found to have RLE DVT with positive PFO. She was discharged on heparin  IV and brilinta . In CIR, her heparin  IV changed to eliquis . CT repeat questionable some increased signal at left hemorrhagic transformation site. Neuro consulted. Repeat CT today showed stable HT.   Plan: Recent stroke with Hemorrhagic conversion 1/29 MRI similar intraparenchymal hemorrhage centered in the left basal ganglia  2/4 CT repeat Previously seen hemorrhagic infarction in the left insula, lateral basal ganglia and frontal operculum with expected regional low density. Central blood products remain visible and actually appear slightly more dense and better circumscribed, suggesting that there  could be some additional or ongoing hemorrhage. Previously, the hemorrhage was becoming less dense and less distinct. However, an additional explanation could be that of clot contraction. 2/5 CT repeat stable hemorrhagic conversion.  Changed eliquis  to 5mg  bid given hemorrhagic conversion P2Y12 =1  Decreased brilinta  from 90->60 mg bid Continue PT/OT/speech  Recent stroke 03/2023: left MCA territory due to left MCA occlusion with underlying stenosis s/p IR with rescue stent, TICI3 revascularization Etiology: RLE DVT in the setting of PFO versus cardioembolic source CT 1/23 left MCA infarct CT head and neck Short segment occlusion or near occlusive stenosis in left M1, with possible reconstitution in the proximal left M2. MRI  1/23 Scattered acute to early subacute ischemic infarcts left cerebral hemisphere, left MCA  distribution. Few scattered superimposed late subacute to chronic left MCA distribution infarct. Chronic left cerebellar infarct Symptoms worsened on 1/27 1/27 Code Stroke CT head expected evolution of recently seen ischemic infarct left MCA territory. CTA head & neck Proximal left M1 occlusion with only minimal collateralization of left MCA branch vessels, progressed from 1/23 Status post IR with TICI3 and left MCA stenting Repeat MRI 1/28: acute left MCA infarct with intraparenchymal hemorrhage centered in the left basal ganglia.  MRA Head 1/28: Left M1 is patent 2D Echo: LVEF 60-65%, Moderately dilated left atria VAS US  DVT: Acute DVT Right common femoral vein and SF junction LDL 124 HgbA1c 5.9 UDS positive for THC Discharged on heparin  IV and brilinta    History of stroke 11/2016 left PICA infarct, MRA negative.  Carotid Doppler unremarkable.  EF 65 to 70%.  LDL 139, A1c 5.6.  TEE showed possible PFO.  LE venous Doppler no DVT.  Discharged on aspirin  and statin.  Loop recorder recommended but never done. 02/2017 had TCD bubble study showed only Spencer degree 1 at rest and  with Valsalva   DVT VAS US  DVT: Acute DVT Right common femoral vein and SF junction History of a PFO on TEE Was on heparin  IV and now on eliquis  5mg  bid given hemorrhagic conversion   Hypertension Home meds:  amlodipine  10mg , hydrochlorothiazide  12.5mg  Stable Long-term BP goal normotensive   Hyperlipidemia Discharged on Lipitor  80mg  and zetia  recently LDL 124, goal < 70 Continue Zetia  and statin at discharge   Other Stroke Risk Factors Family hx stroke (maternal uncle) Former smoker Substance abuse, UDS positive for THC, cessation education provided  Thank you for this consultation and allowing us  to participate in the care of this patient.  Ary Cummins, MD PhD Stroke Neurology 04/25/2023 12:46 PM

## 2023-04-25 NOTE — Progress Notes (Signed)
 Occupational Therapy Session Note  Patient Details  Name: Brittany [name] MRN: [id number] Date of Birth: 1965/06/23   Today's Date: 04/25/2023 OT Individual Time: 9294-9257 OT Individual Time Calculation (min): 37 min  and Today's Date: 04/25/2023 OT Missed Time: 20 Minutes Missed Time Reason: Patient unwilling/refused to participate without medical reason   Short Term Goals: Week 1:  OT Short Term Goal 1 (Week 1): STGs=LTGs due to patient's estimated length of stay.  Skilled Therapeutic Interventions/Progress Updates:   Pt received resting in bed for session with focus on OOB activity and attention to therapeutic tasks. Pt requires EXTENSIVE encouragement to reach EOB static sitting, Mod-Max overall (anticipate due to unwillingness to get OOB). Sitting at EOB, pt leans head on therapist, no longer opening eyes when named called, cool washcloth placed in hands for alertness, pt shakes head no when cued to clean face with non-affected hand. Ultimately cleans face briefly, before returning self to supine without assistance. OT attempting to make patient comfortable in bed, pt initially unwilling rolling laterally with increased encouragement and when therapist moves chuck pad. Pt's light remained on, door and window shades open to assist with arousal. Pt missing ~20 mins of skilled OT intervention due to refusal.   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), RLE DVT (no limitations according to H&P) Restrictions Weight Bearing Restrictions Per Provider Order: No   Therapy/Group: Individual Therapy  Nereida Habermann, OTR/L, MSOT  04/25/2023, 5:10 AM

## 2023-04-25 NOTE — Progress Notes (Signed)
 Continued to reinforce communication/communication board. Patient verbalized understanding.    Randeen Busman, LPN

## 2023-04-25 NOTE — Patient Care Conference (Addendum)
 Inpatient RehabilitationTeam Conference and Plan of Care Update Date: 04/25/2023   Time: 1157 am    Patient Name: Brittany [name]      Medical Record Number: [id number]  Date of Birth: 11/10/1965 Sex: Female         Room/Bed: 4W07C/4W07C-01 Payor Info: Payor: [place] MEDICAID PREPAID HEALTH PLAN / Plan: [place] MEDICAID [employer] / Product Type: *No Product type* /    Admit Date/Time:  04/20/2023  6:09 PM  Primary Diagnosis:  Acute ischemic left middle cerebral artery (MCA) stroke [facility])  Hospital Problems: Principal Problem:   Acute ischemic left middle cerebral artery (MCA) stroke (HCC) Active Problems:   Acute deep vein thrombosis (DVT) of femoral vein of right lower extremity [facility])    Expected Discharge Date: Expected Discharge Date: 05/03/23  Team Members Present: Physician leading conference: Dr. Murray Collier Social Worker Present: Rhoda Clement, LCSW Nurse Present: Barnie Ronde, [place], RN PT Present: Doreene Orris, PT OT Present: Nereida Habermann, OT SLP Present: Rosina Downy, SLP     Current Status/Progress Goal Weekly Team Focus  Bowel/Bladder   Continent B/B LBM 04/22/23   Will remain continent with normal B/B pattern   Assist with toileting needs qshit/prn    Swallow/Nutrition/ Hydration   UTA at bedside due to pain and limited participation   pending evaluation  bedside swallow evaluation    ADL's   Recent participation greatly limited in comparison to evaluation day. Based on eval, Min A for ADL participation due to R-hemi & R-in attention, CGA-Min A toilet transfers. Barriers: Increased pain/discomfort and limited communication strategies.   Supervision; potential for downgraded dependent upon current presentation   Tolerance for OOB activity, attention to functional tasks, caregiver education and plan for discharge    Mobility   bed mobility-supervision rolling, supine to sit mod A for trunk, sit to stand, stand pivot  trasnfer RW and CGA   sup/independent goals  discharge planning, independence with mobility, family training    Communication   severe receptive/expressive language deficits, limited yes/no accuracy but more functional for environmental/basic questions, utilizes gestures to communicate somewhat effectively, utilizes communication board intermittently with limited success, can only write if copying, cannot write words spontaneously   max assist for receptive and expressive language   increasing yes/no accuracy, following commands, multimodal communication of wants and needs    Safety/Cognition/ Behavioral Observations  difficult to assess d/t pain and severity of language deficits            Pain   Pain to right lower side of abdomen. Scheduled Tylenol  to manage pain   Will be free from pain   Assess pain qshit/prn and follow up per provider's orders    Skin   Skin is intact   Will maintain skin intergrity with no breakdown  Assess skin or breakdown and provide education to prevent skin breakdown      Discharge Planning:  Unsure of plan parents want her to return to apartment mod/i level. Son in [place] works and pt would need to re-apply for medicaid and have no serivces while waiting for this to get approved. Pt has no plan when came here.   Team Discussion: Patient was admitted post Left MCA infarct with severe left MCA stenosis. Patient has severe receptive/expressive language deficits and right inattention; repeat head CT was done and neurology consulted.  Patient has a right groin hematoma limiting movement of the lower extremity; by pain . medications were adjusted and this is being followed by Interventional Radiology.  Patient on target to meet rehab goals: No, Patient requires min assist with ADLs. Patient requires supervision with sit to stand; and Mod assist to walk using a walker.  *See Care Plan and progress notes for long and short-term goals.   Revisions to  Treatment Plan:  15/7  therapy schedule Neurology Consult Interventional Radiology consult   Teaching Needs: Safety, medications, transfers, toileting, etc.  Current Barriers to Discharge: Decreased caregiver support and Behavior  Possible Resolutions to Barriers: Family Education Medicaid application     Medical Summary Current Status: L MCA CVA, DVT, HTN, RLQ pain - hematoma, ARF, substance abuse history, DVT  Barriers to Discharge: Behavior/Mood;Electrolyte abnormality;Inadequate Nutritional Intake;Medical stability;Renal Insufficiency/Failure;Uncontrolled Pain;Neurogenic Bowel & Bladder  Barriers to Discharge Comments: Incontinence, Hematoma, DVT, behavior Possible Resolutions to [employer] Focus: eliquis , Repeat CT scan, avoid sedating medications, IFV, repeat labs   Continued Need for Acute Rehabilitation Level of Care: The patient requires daily medical management by a physician with specialized training in physical medicine and rehabilitation for the following reasons: Direction of a multidisciplinary physical rehabilitation program to maximize functional independence : Yes Medical management of patient stability for increased activity during participation in an intensive rehabilitation regime.: Yes Analysis of laboratory values and/or radiology reports with any subsequent need for medication adjustment and/or medical intervention. : Yes   I attest that I was present, lead the team conference, and concur with the assessment and plan of the team.   Chanese Hartsough Gayo 04/26/2023, 11:22 AM

## 2023-04-25 NOTE — Progress Notes (Signed)
Tele-monitor not available at this time.    [name] Dome, LPN

## 2023-04-26 ENCOUNTER — Institutional Professional Consult (permissible substitution) (HOSPITAL_BASED_OUTPATIENT_CLINIC_OR_DEPARTMENT_OTHER): Payer: Medicaid Other | Admitting: Family

## 2023-04-26 DIAGNOSIS — I82411 Acute embolism and thrombosis of right femoral vein: Secondary | ICD-10-CM | POA: Diagnosis not present

## 2023-04-26 DIAGNOSIS — I63512 Cerebral infarction due to unspecified occlusion or stenosis of left middle cerebral artery: Secondary | ICD-10-CM | POA: Diagnosis not present

## 2023-04-26 DIAGNOSIS — R1031 Right lower quadrant pain: Secondary | ICD-10-CM | POA: Diagnosis not present

## 2023-04-26 DIAGNOSIS — N179 Acute kidney failure, unspecified: Secondary | ICD-10-CM | POA: Diagnosis not present

## 2023-04-26 DIAGNOSIS — I10 Essential (primary) hypertension: Secondary | ICD-10-CM | POA: Diagnosis not present

## 2023-04-26 DIAGNOSIS — E876 Hypokalemia: Secondary | ICD-10-CM | POA: Diagnosis not present

## 2023-04-26 LAB — CBC
HCT: 34.7 % — ABNORMAL LOW (ref 36.0–46.0)
Hemoglobin: 11.5 g/dL — ABNORMAL LOW (ref 12.0–15.0)
MCH: 29.3 pg (ref 26.0–34.0)
MCHC: 33.1 g/dL (ref 30.0–36.0)
MCV: 88.3 fL (ref 80.0–100.0)
Platelets: 155 10*3/uL (ref 150–400)
RBC: 3.93 MIL/uL (ref 3.87–5.11)
RDW: 14.5 % (ref 11.5–15.5)
WBC: 4.7 10*3/uL (ref 4.0–10.5)
nRBC: 0 % (ref 0.0–0.2)

## 2023-04-26 LAB — BASIC METABOLIC PANEL
Anion gap: 10 (ref 5–15)
BUN: 13 mg/dL (ref 6–20)
CO2: 19 mmol/L — ABNORMAL LOW (ref 22–32)
Calcium: 8 mg/dL — ABNORMAL LOW (ref 8.9–10.3)
Chloride: 110 mmol/L (ref 98–111)
Creatinine, Ser: 0.92 mg/dL (ref 0.44–1.00)
GFR, Estimated: >60 mL/min (ref >60)
Glucose, Bld: 85 mg/dL (ref 70–99)
Potassium: 3.7 mmol/L (ref 3.5–5.1)
Sodium: 139 mmol/L (ref 135–145)

## 2023-04-26 LAB — TROPONIN I (HIGH SENSITIVITY)
Troponin I (High Sensitivity): 27 ng/L — ABNORMAL HIGH (ref <18)
Troponin I (High Sensitivity): 25 ng/L — ABNORMAL HIGH (ref <18)

## 2023-04-26 MED ORDER — NITROGLYCERIN 0.4 MG SL SUBL
0.4000 mg | SUBLINGUAL_TABLET | SUBLINGUAL | Status: DC | PRN
  Administered 2023-04-26: 0.4 mg via SUBLINGUAL
  Filled 2023-04-26: qty 1

## 2023-04-26 NOTE — Progress Notes (Signed)
 Nurse observed lab come in to draw 2nd troponin. Patient laying in bed. Alert. Respirations even and unlabored. No c/o of pain. Tolerated well.

## 2023-04-26 NOTE — Progress Notes (Signed)
 Consulting civil engineer, called reporting patient is crying and holding her chest. Patient is aphasic , Stat EKG, Trop ordered and asked to have Rapid Nurse to evaluate, she verbalizes understanding.

## 2023-04-26 NOTE — Progress Notes (Signed)
 Called Nurse: Awaiting on EKG and Trop results.  Spoke with Electrical Engineer, she states  when she began her shift Ms. Novosel was crying  and pointing to the computer and her stomach. At this time she is resting and vitals are stable, awaiting EKG and Trop results. EKG results not in the computer, awaiting on Chelsea, to put EKG in the computer, awaiting on return call.

## 2023-04-26 NOTE — Progress Notes (Signed)
 EKG and Trop reviewed. Spoke with [facility] Ms Tschirhart resting without any complaints, we will continue to monitor.

## 2023-04-26 NOTE — Progress Notes (Signed)
 EKG and Trop reviewed. Brittany [name]

## 2023-04-26 NOTE — Evaluation (Signed)
 Speech Language Pathology Assessment and Plan  Patient Details  Name: Brittany [name] MRN: [id number] Date of Birth: 03-30-1965  SLP Diagnosis: Aphasia;Apraxia;Speech and Language deficits  Rehab Potential: Good ELOS: 10-12 days   Today's Date: 04/26/2023 SLP Individual Time: 9191-9083 SLP Individual Time Calculation (min): 68 min  Hospital Problem: Principal Problem:   Acute ischemic left middle cerebral artery (MCA) stroke (HCC) Active Problems:   Acute deep vein thrombosis (DVT) of femoral vein of right lower extremity (HCC)  Past Medical History:  Past Medical History:  Diagnosis Date   Anemia    Arthritis    trigger finger in left hand   Complication of anesthesia    per pt, hard to wake up!   Elevated cholesterol    Female bladder prolapse    per urologist, does not have prolaspe   GERD (gastroesophageal reflux disease)    Heart murmur    pt unsure.   Hiatal hernia    Hypertension    IBS (irritable bowel syndrome)    Multiple myeloma (HCC) 2005   had partial chemo   Peripheral neuropathy    SOB (shortness of breath) on exertion    uses an inhaler   Stroke (HCC) 2017   paralysis left arm/uses a walker   Past Surgical History:  Past Surgical History:  Procedure Laterality Date   BONE BIOPSY  2005   in her back   COLONOSCOPY     over 10 years x3   ESOPHAGOGASTRODUODENOSCOPY     incomplete-over 10 years ago    IR CT HEAD LTD  04/16/2023   IR CT HEAD LTD  04/16/2023   IR INTRA CRAN STENT  04/16/2023   IR PERCUTANEOUS ART THROMBECTOMY/INFUSION INTRACRANIAL INC DIAG ANGIO  04/16/2023   RADIOLOGY WITH ANESTHESIA N/A 04/16/2023   Procedure: IR WITH ANESTHESIA;  Surgeon: Dolphus Carrion, MD;  Location: [facility] OR;  Service: Radiology;  Laterality: N/A;   TEE WITHOUT CARDIOVERSION N/A 12/08/2016   Procedure: TRANSESOPHAGEAL ECHOCARDIOGRAM (TEE);  Surgeon: Shlomo Wilbert SAUNDERS, MD;  Location: [facility] ENDOSCOPY;  Service: Cardiovascular;  Laterality: N/A;   TUBAL LIGATION      UPPER GASTROINTESTINAL ENDOSCOPY     WISDOM TOOTH EXTRACTION      Assessment / Plan / Recommendation Clinical Impression  Pt is a 58 y/o female with PMH of previous L cerebellar stroke, HTN, peripheral neuropathy, and multiple myeloma who presented to [facility] on 04/12/23 with c/o 1-2 week history of word finding deficits and dysarthria. She was found to have subacute L MCA infarct due to severe L MCA stenosis at the time. On 1/27 code stroke called due to AMS with right gaze deviation and increased aphasia. CTA revealed proximal left M1 occlusion and she underwent mechanical thrombectomy. MRI showed infarct in the low left basal ganglia with intraparenchymal hemorrhage. Therapy evaluations completed and pt was recommended for CIR.    Swallowing: Patient exhibits clinical s/sx of oral dysphagia as characterized primarily by reduced bolus awareness with solids, resulting in right buccal cavity pocketing of solid bolus material. SLP administered thin liquids and regular solids with no overt s/sx of penetration/aspiration across all trials. Patient required mod cues to recognize and clear bolus residuals from the oral cavity. During oral mechanism exam, patient exhibited difficulty following 1-step directions despite max cues and models. Patient demonstrated groping/apraxic behaviors when attempting to approximate desired mouth positions during exam. Patient would benefit from SLP services to target swallowing dysfunction during inpatient rehabilitation stay. Recommend Dys3/thin liquid diet with medications administered whole  with water . Full supervision to check for buccal pocketing and encourage safe swallowing strategies.   Patient was left in lowered bed with call bell in reach and bed alarm set. SLP will continue to target goals per plan of care.     Skilled Therapeutic Interventions          SLP conducted bedside swallow evaluation to clinically/subjectively assess oropharyngeal swallowing function.  Full results above.    SLP Assessment  Patient will need skilled Speech Lanaguage Pathology Services during CIR admission    Recommendations  SLP Diet Recommendations: Dysphagia 3 (Mech soft);Thin Liquid Administration via: Cup;Straw Medication Administration: Whole meds with liquid Supervision: Intermittent supervision to cue for compensatory strategies Compensations: Slow rate;Small sips/bites;Minimize environmental distractions;Lingual sweep for clearance of pocketing Oral Care Recommendations: Oral care BID Recommendations for Other Services: Neuropsych consult Patient destination: Home Follow up Recommendations: Outpatient SLP;24 hour supervision/assistance Equipment Recommended: None recommended by SLP    SLP Frequency 3 to 5 out of 7 days   SLP Duration  SLP Intensity  SLP Treatment/Interventions 10-12 days  Minumum of 1-2 x/day, 30 to 90 minutes  Multimodal communication approach;Speech/Language facilitation;Environmental controls;Cueing hierarchy;Functional tasks;Therapeutic Activities;Internal/external aids;Patient/family education;Dysphagia/aspiration precaution training    Pain Pain Assessment Pain Scale: 0-10 Pain Score: 0-No pain  Prior Functioning Cognitive/Linguistic Baseline: Within functional limits Baseline deficit details: Family at bedside reports patient is fully independent with all ADL/iADLs at baseline. Type of Home: House  Lives With: Alone Available Help at Discharge: Family;Personal care attendant;Available PRN/intermittently Education: information not available Vocation: Self employed  Care Tool Care Tool Cognition Ability to hear (with hearing aid or hearing appliances if normally used Ability to hear (with hearing aid or hearing appliances if normally used): 0. Adequate - no difficulty in normal conservation, social interaction, listening to TV   Expression of Ideas and Wants Expression of Ideas and Wants: 1. Rarely/Never expressess or very  difficult - rarely/never expresses self or speech is very difficult to understand   Understanding Verbal and Non-Verbal Content Understanding Verbal and Non-Verbal Content: 2. Sometimes understands - understands only basic conversations or simple, direct phrases. Frequently requires cues to understand  Memory/Recall Ability Memory/Recall Ability : None of the above were recalled    Bedside Swallowing Assessment General Date of Onset: 04/12/23 Previous Swallow Assessment: n/a Diet Prior to this Study: Dysphagia 3 (mechanical soft);Thin liquids (Level 0) Temperature Spikes Noted: No Respiratory Status: Room air History of Recent Intubation: Yes Total duration of intubation (days): 1 days Date extubated: 04/17/23 Behavior/Cognition: Cooperative;Pleasant mood;Alert Oral Cavity - Dentition: Dentures, not available;Missing dentition Self-Feeding Abilities: Able to feed self Vision: Functional for self-feeding Patient Positioning: Partially reclined Baseline Vocal Quality: Normal Volitional Cough: Cognitively unable to elicit Volitional Swallow: Unable to elicit  Oral Care Assessment Oral Assessment  (WDL): Exceptions to WDL Lips: Symmetrical Teeth: Poor dental hygiene Tongue: Pink;Moist;Coated white Mucous Membrane(s): Moist;Pink Saliva: Moist, saliva free flowing Level of Consciousness: Alert Is patient on any of following O2 devices?: None of the above Nutritional status: Dysphagia Oral Assessment Risk : High Risk Ice Chips Ice chips: Not tested Thin Liquid Thin Liquid: Within functional limits Presentation: Cup;Straw Nectar Thick Nectar Thick Liquid: Not tested Honey Thick Honey Thick Liquid: Not tested Puree Puree: Not tested Solid Solid: Impaired Presentation: Self Fed Oral Phase Impairments: Poor awareness of bolus Oral Phase Functional Implications: Right lateral sulci pocketing BSE Assessment Risk for Aspiration Impact on safety and function: Mild aspiration  risk Other Related Risk Factors: Previous CVA;Cognitive impairment  Short Term Goals:  Week 1: SLP Short Term Goal 1 (Week 1): Patient will answer basic environmental and biographical yes/no questions with 90% accuracy given max assist. SLP Short Term Goal 2 (Week 1): Patient will follow 1-step directions with 90% accuracy given max assist. SLP Short Term Goal 3 (Week 1): Patient will verbalize trained word-level functional utterances in 8/10 opportunities given max assist. SLP Short Term Goal 4 (Week 1): Patient will demonstrate use of recommended swallow safety strategies by clearing residue from the right buccal cavity during solid consumption in 4/5 opportunities given max verbal cues.  Refer to Care Plan for Long Term Goals  Recommendations for other services: Neuropsych  Discharge Criteria: Patient will be discharged from SLP if patient refuses treatment 3 consecutive times without medical reason, if treatment goals not met, if there is a change in medical status, if patient makes no progress towards goals or if patient is discharged from hospital.  The above assessment, treatment plan, treatment alternatives and goals were discussed and mutually agreed upon: by patient  Rachard Isidro A Sheretha Shadd 04/26/2023, 12:23 PM

## 2023-04-26 NOTE — Progress Notes (Addendum)
 Patient ID: Brittany [name], female   DOB: 10-23-1965, 58 y.o.   MRN: [id number]  Spoke with Mom separtely from pt and she reports she is much different form this stroke. She was very independent and sewed and did crafting and prided herself on her independence. Mom is wondering if she will remain like this and if they can provide the care she will need. Discussed her other option is to go to a NH and not receive therapies due to medicaid does not cover them. This is the reasoning to go home with home health and to see how much pt can progress. Mom reports her son will be calling today with his concerns. If plan to move to [facility] will need to apply for medicaid down there and cancel her insurance here and then find MD's to follow her there.  2:27 PM Spoke with daughter in-law-Latasha who was trying to see how pt was doing. She is doing better and participating in therapies as much as she can with her DVT. Discussed what would need to happen for her to go to [facility]. Medicaid will need to be rescinded here ans re-applied for in [facility] unsure of residency requirements. Also they would need to connect her with doctors to follow. Currently the plan is to go to parents home then once rehab completed to eventually move to [facility] with her son and daughter in-law

## 2023-04-26 NOTE — Progress Notes (Signed)
 PROGRESS NOTE   Subjective/Complaints:  Patient doing much better today and yesterday.  No new concerns elicited today.  Participating with therapies this morning.  ROS: limited due to aphasia.   Objective:   CT HEAD WO CONTRAST ( ) Result Date: 04/25/2023 CLINICAL DATA:  Stroke, follow up EXAM: CT HEAD WITHOUT CONTRAST TECHNIQUE: Contiguous axial images were obtained from the base of the skull through the vertex without intravenous contrast. RADIATION DOSE REDUCTION: This exam was performed according to the departmental dose-optimization program which includes automated exposure control, adjustment of the mA and/or kV according to patient size and/or use of iterative reconstruction technique. COMPARISON:  CT head April 24, 2023. FINDINGS: Brain: Stable hemorrhagic infarct in the left MCA territory. No progressive mass effect or progressive hemorrhage. No hydrocephalus. No visible mass lesion. Vascular: Left M1 stent. Skull: No acute fracture. Sinuses/Orbits: Clear sinuses.  No acute orbital findings. Other: No mastoid effusions. IMPRESSION: Stable hemorrhagic infarct in the left MCA territory. No progressive mass effect or progressive hemorrhage. Electronically Signed   By: Gilmore GORMAN Molt M.D.   On: 04/25/2023 14:51   CT ABDOMEN PELVIS WO CONTRAST Result Date: 04/24/2023 CLINICAL DATA:  Abdominal pain, acute, nonlocalized. EXAM: CT ABDOMEN AND PELVIS WITHOUT CONTRAST TECHNIQUE: Multidetector CT imaging of the abdomen and pelvis was performed following the standard protocol without IV contrast. RADIATION DOSE REDUCTION: This exam was performed according to the departmental dose-optimization program which includes automated exposure control, adjustment of the mA and/or kV according to patient size and/or use of iterative reconstruction technique. COMPARISON:  Abdominal radiography 04/20/2023.  CT 05/30/2018. FINDINGS: Lower chest: Mild  atelectasis in the right middle lobe. No pleural effusion. Hepatobiliary: Liver parenchyma appears normal without contrast. No calcified gallstones. Pancreas: Normal Spleen: Normal Adrenals/Urinary Tract: Adrenal glands are normal. Right kidney is normal. Left kidney is normal except for a simple appearing cyst, maximal dimension 5.7 cm, enlarged since 2020. No hydronephrosis. Bladder is normal. Stomach/Bowel: Stomach and small intestine are normal. Appendix is normal. Moderate amount of stool and fluid in the colon without evidence of frank obstruction. No finding to suggest colitis or diverticulitis. Vascular/Lymphatic: Aortic atherosclerosis. No aneurysm. IVC is normal. No adenopathy. Edematous stranding in the right groin, presumably subsequent to the previous vascular access. No evidence of well-defined focal collection. Reproductive: No pelvic mass. Other: No free fluid or air. Musculoskeletal: Ordinary mild lumbar degenerative changes. IMPRESSION: 1. No acute finding by CT. Moderate amount of stool and fluid in the colon without evidence of frank obstruction. No finding to suggest colitis or diverticulitis. 2. Aortic atherosclerosis. 3. Edematous stranding in the right groin, presumably subsequent to the previous vascular access. No evidence of well-defined focal collection. Electronically Signed   By: Oneil Officer M.D.   On: 04/24/2023 15:44   CT HEAD WO CONTRAST ( ) Result Date: 04/24/2023 CLINICAL DATA:  Stroke.  Follow-up. EXAM: CT HEAD WITHOUT CONTRAST TECHNIQUE: Contiguous axial images were obtained from the base of the skull through the vertex without intravenous contrast. RADIATION DOSE REDUCTION: This exam was performed according to the departmental dose-optimization program which includes automated exposure control, adjustment of the mA and/or kV according to patient size and/or use of iterative reconstruction technique.  COMPARISON:  CT 04/16/2023 FINDINGS: Brain: No focal abnormality affects  the brainstem. Old infarctions noted within the inferior cerebellum on the left. Previously placed left MCA vascular stent. Previously seen hemorrhagic infarction in the left insula, lateral basal ganglia and frontal operculum with expected regional low density. Central blood products remain visible and actually appear slightly more dense and better circumscribed, suggesting that there could be some additional or ongoing hemorrhage. Alternatively, this could be due to clot contraction. Previously, the hemorrhage was becoming less dense and less distinct. Other scattered acute infarctions in the left MCA territory continue to show low-density but no hemorrhage in those locations. No evidence of a new regional stroke. Mild mass effect but no midline shift. No subdural or subarachnoid bleeding. Vascular: No other vascular finding. Skull: Negative Sinuses/Orbits: Clear/normal Other: None IMPRESSION: 1. Previously seen hemorrhagic infarction in the left insula, lateral basal ganglia and frontal operculum with expected regional low density. Central blood products remain visible and actually appear slightly more dense and better circumscribed, suggesting that there could be some additional or ongoing hemorrhage. Previously, the hemorrhage was becoming less dense and less distinct. However, an additional explanation could be that of clot contraction. 2. Other scattered acute infarctions in the left MCA territory continue to show low-density but no hemorrhage in those locations. 3. No evidence of a new regional stroke. Electronically Signed   By: Oneil Officer M.D.   On: 04/24/2023 15:41   Recent Labs    04/24/23 1414 04/26/23 0532  WBC 4.4 4.7  HGB 13.8 11.5*  HCT 40.6 34.7*  PLT 186 155   Recent Labs    04/25/23 1150 04/26/23 0532  NA 137 139  K 3.7 3.7  CL 109 110  CO2 19* 19*  GLUCOSE 94 85  BUN 16 13  CREATININE 1.09* 0.92  CALCIUM  8.5* 8.0*         Intake/Output Summary (Last 24 hours) at  04/26/2023 1056 Last data filed at 04/26/2023 0758 Gross per 24 hour  Intake 2902.85 ml  Output --  Net 2902.85 ml        Physical Exam: Vital Signs Blood pressure (!) 155/76, pulse 87, temperature 98.2 F (36.8 C), temperature source Oral, resp. rate 19, height 5' 2 (1.575 m), weight 67.4 kg, SpO2 99%.  Constitutional: Laying in bed, Alert and wake, NAD HENT:   Atraumatic, normocephalic. Eyes: PERRLA. EOM seem intact. No notable nystagmus.  Cardiovascular: RRR, no murmurs/rub/gallops. No Edema. Peripheral pulses 2+  Respiratory: CTAB. No rales, rhonchi, or wheezing. On RA.  Abdomen: soft, + bowel sounds, nondistended, nontender Skin: C/D/I. No apparent lesions. Extremities: R groin with large hematoma in the inguinal canal area, mildly tender no overlying erythema or warmth, scattered bruising to this area -unchanged from prior days Neuro: Alert awake, answering yes/no questions and gesturing for communication, following simple commands Continued aphasia appears more expressive, dysarthric voice, mild right facial weakness Moving all 4 extremities in bed  PRIOR EXAMS: MSK:      No apparent deformity. No joint pain on ROM.        Neurologic exam:  Cognition: Awake, alert. Responds to 1/3 verbal commands,  2/3 commands with physical cues.  + Severe R hemineglect Language: Global aphasia, expressive > receptive. Anomic. Dysarthric.  Insight: Poor insight into current condition.  Mood: Anxious,  but appropriate and interactive.  Sensation: To light touch reduced on RLE Reflexes: Hyporeflexic RLE, 2+ RUE; 1+ LUE and LLE. Negative Hoffman's and babinski signs bilaterally.  CN: + Mild  facial droop  Coordination: No apparent tremors.   Spasticity: MAS 1-2 R elbow flexors, 1 elbow extensors, 1 wrist flexors      Strength: 5/5 LUE and LLE;       R side difficult to assess due to neglect.  RUE 1/5 grip, elbow flexion c/b motor apraxia RLE   3/5 x1 with R hip flexor and knee flexor; no  ankle movement.     Assessment/Plan: 1. Functional deficits which require 3+ hours per day of interdisciplinary therapy in a comprehensive inpatient rehab setting. Physiatrist is providing close team supervision and 24 hour management of active medical problems listed below. Physiatrist and rehab team continue to assess barriers to discharge/monitor patient progress toward functional and medical goals  Care Tool:  Bathing    Body parts bathed by patient: Right arm, Left arm, Chest, Abdomen, Front perineal area, Buttocks, Right upper leg, Left upper leg, Right lower leg, Left lower leg, Face         Bathing assist Assist Level: Contact Guard/Touching assist     Upper Body Dressing/Undressing Upper body dressing   What is the patient wearing?: Hospital gown only    Upper body assist Assist Level: Minimal Assistance - Patient > 75%    Lower Body Dressing/Undressing Lower body dressing      What is the patient wearing?: Underwear/pull up, Pants     Lower body assist Assist for lower body dressing: Minimal Assistance - Patient > 75%     Toileting Toileting    Toileting assist Assist for toileting: Contact Guard/Touching assist     Transfers Chair/bed transfer  Transfers assist     Chair/bed transfer assist level: Minimal Assistance - Patient > 75%     Locomotion Ambulation   Ambulation assist      Assist level: Minimal Assistance - Patient > 75% Assistive device: Hand held assist (HHA or using IV pole) Max distance: 75 ft   Walk 10 feet activity   Assist     Assist level: Contact Guard/Touching assist Assistive device: Other (comment) (IV pole)   Walk 50 feet activity   Assist    Assist level: Minimal Assistance - Patient > 75% Assistive device: Other (comment) (IV pole)    Walk 150 feet activity   Assist Walk 150 feet activity did not occur: Safety/medical concerns         Walk 10 feet on uneven surface  activity   Assist Walk  10 feet on uneven surfaces activity did not occur: Safety/medical concerns         Wheelchair     Assist Is the patient using a wheelchair?: Yes Type of Wheelchair: Manual    Wheelchair assist level: Dependent - Patient 0% Max wheelchair distance: 250    Wheelchair 50 feet with 2 turns activity    Assist        Assist Level: Dependent - Patient 0%   Wheelchair 150 feet activity     Assist      Assist Level: Dependent - Patient 0%   Blood pressure (!) 155/76, pulse 87, temperature 98.2 F (36.8 C), temperature source Oral, resp. rate 19, height 5' 2 (1.575 m), weight 67.4 kg, SpO2 99%.  Medical Problem List and Plan: 1. Functional deficits secondary to left MCA infarct due to severe left MCA stenosis; notable Hx prior L cerebellar CVAs             -patient may not shower while on continuous heparin  drip             -  ELOS/Goals: 10 days, SPV PT/OT, Min A SLP             -04/21/23 CIR evals today; ordered SLP eval since it wasn't on there  -Continue CIR  -Repeat CT 2/4- discussed with neuro felt to be stable, neuro checking repeat CT today 2/5 and  ordered P2y12 test 2.  R-CFV DVT (found 1/31)/Antithrombotics: -DVT/anticoagulation:  Pharmaceutical: Heparin  low dose X 48 hours and if stable transition to DOAC              -antiplatelet therapy: Brillinta. ASA d/c due to addition of Heparin .  - Per lit review, early mobilization current recommendation, without gross restrictions; no convincing evidence of increased PE risk.   -04/22/23 long discussion with pt and brother regarding clot location, this coincides with area of pain, large palpable ?hematoma to R groin; had mechanical thrombectomy-- called IR Dr. Juliene Balder who relayed to the team, Delon Beagle NP will come see the patient, appreciate their assistance with this case. For now, added Tramadol  100mg  q8h PRN to help with pain.  -04/23/23 Vas U/S with hematoma/continued DVT-smaller, Pt seen by IR this AM -  recommend avoiding bending or stooping, limited mobility- transfers only for 1-2 days.  Ok to transition to eliquis   -2/6 Dr. Dolphus recommends 1 more day of light activity than full activity Friday   3. Pain Management:  Tylenol  prn. Gabapentin  300mg  TID -04/22/23 added Tramadol  100mg  q8h PRN for pain in R groin (see above)-- pt allergic/intolerant to other hydro/oxycodone .  -2/4 scheduled Tylenol  -2/6 appears comfortable today, continue current regimen 4. Mood/Behavior/Sleep: LCSW to follow for evaluation and support.              -antipsychotic agents: N/A -04/21/23 didn't sleep well, will add melatonin 5mg  PRN, has trazodone  PRN as well -04/23/23 will discontinue tramadol , Benadryl  as needed, DC melatonin, DC hydrocodone  -2/5 trazodone  25mg  hs started yesterday, seroquel  PRN was started -2/6 doing dramatically better, continue current regimen 5. Neuropsych/cognition: This patient is not capable of making decisions on her own behalf. 6. Skin/Wound Care: Routine pressure relief measures.  7. Fluids/Electrolytes/Nutrition: Monitor I/O. Check routine labs -04/21/23 Cr 1.19 a little up from yesterday but down from prior, monitor; K 3.3, see below; remainder of CMP unremarkable, monitor labs 8. HTN: Monitor BP TID--continue amlodipine  10mg  daily and hydralazine  50mg  q8h             --avoid hypoperfusion.   -2/4 intermittently elevated, continue to monitor trend for now.  Avoid hypotension  2/6 BP stable today, continue current BP regimen and monitor Vitals:   04/24/23 1117 04/24/23 1232 04/24/23 1340 04/24/23 1918  BP: (!) 157/71 (!) 147/74 (!) 140/90 (!) 149/80   04/24/23 2149 04/25/23 0553 04/25/23 0557 04/25/23 1542  BP: (!) 149/80 (!) 147/80 (!) 147/80 (!) 151/73   04/25/23 2011 04/26/23 0432 04/26/23 0541 04/26/23 0932  BP: 123/68 (!) 107/59 122/68 (!) 155/76    9. RLQ pain:  Felt to be due to IBS. Has been on miralax  daily and Senna S 2 tabs nightly without results --Linzess  72mcg every  day was started today. Will get KUB  -04/21/23 KUB with gas filled colon but otherwise neg/nonobstructive; reviewed images myself, suspect stool burden contributory to gas trapped in R side; will increase miralax /colace to BID, leave senna S as is, add sorbitol  30ml PRN if no BM today; has PRN dulcolax supp and fleet if needed too; monitor closely -04/22/23 pt insists she's had BMs daily, but no documentation; no abd  pain today, appears it's R groin pain-- see above #2-- tramadol  added, leave stool softeners in place, NEED DOCUMENTATION OF STOOLS -2/6 LBM today having more regular bowel movements 10. Hypokalemia: Supplement added X 2 days.  -04/21/23 K 3.3, potassium supplement not ordered, 40mEq daily x2 days added today -2/6 3.7 today 11. Hyperlipidemia: LDL-124 on Lipitor  80 mg and Zetia  10mg  daily.  12. Pre-diabetes: Hgb A1c- 5.9 and every 4 hours CBS have shown BS controlled. --Will monitor fasting BS on serial labs.  13. Substance abuse: Educated on stroke risk w/marijuana.  14. H/o MM: Acute renal failure improving SCr 1.11 @ admission-->1.59-->1.07  -04/21/23 Cr 1.19, monitor  -2/3 Cr improved to 0.97  Recheck labs, start IVF NS 13ml/hr due to poor intake   2/6 creatinine down to 0.92, DC IVF and encourage oral fluids 15. GERD: On protonix  40mg  daily     LOS: 6 days A FACE TO FACE EVALUATION WAS PERFORMED  Murray Collier 04/26/2023, 10:56 AM

## 2023-04-26 NOTE — Progress Notes (Signed)
 Physical Therapy Session Note  Patient Details  Name: Brittany [name] MRN: [id number] Date of Birth: 01-07-1966  Today's Date: 04/26/2023 PT Individual Time: 1101-1203, 8580-8554 PT Individual Time Calculation (min): 62 min, 26 min   Short Term Goals: Week 1:  PT Short Term Goal 1 (Week 1): Pt will perform bed mobility from flat bed surface and no rails with Mod I. PT Short Term Goal 2 (Week 1): Pt will perform functional transfers with no AD and supervision. PT Short Term Goal 3 (Week 1): Pt will ambulate short, household distances with no AD and close supervision. PT Short Term Goal 4 (Week 1): Pt will ambulate community distances using rollator with supervision. PT Short Term Goal 5 (Week 1): Pt wil complete outcome measure.  Skilled Therapeutic Interventions/Progress Updates:      Treatment Session 1  Pt supine in bed upon arrival. Pt agreeable to therapy. Pt denies any pain.   Therapist manging IV throughout session.   Therapist offered to assist with transfer to [facility], pt initially agreeabel but refusing with doffing pants. Pt agreeabel to transfer to [facility]. Supine to sit with use of bed rail and supervision, stand pivot transfer no AD and CGA bed to WC, upon exiting room, pt grabbing with UE and pericare to indicate need to use bathroom, stand pivot transfer WC to BSC, BSC to WC with no AD and CGA, pt donned/doffed pants with CGA and performed pericare with total A, pt continent of bowel and bladder.   Pt seated in WC sorted numbered colored dots 6,7,8,9 in numerical order, progressed to adding in numbers 2,3,and 12. Pt matched colored cones x5, and colored clips x5 to matching colored dots with max verbal questioning cues, pt utilized L UE to place clip and R UE to remove clip with verbal cues/demonstration provided  for technique. Pt able to articulate the number six after repeating after therapist however unable to articulate the numbers or colors of the remaining colored dots.    While seated in WC, pt matched 12 cards by color number and suit, with max questioning cues with one error, pt initially matching 9 of diamonds to ( of hearts.   Pt performed visual scanning on bits to identify numbers 1-15 in numerical order, max questioning cues provided for sequencing of numbers and understanding tasks, with pt able to complete correctly 25% of the time.   Of note: pt did not demonstrate or aticulate being cold when toileting or upon arrival, pt as soon as pt in WC, shaking, lips trembling to indicate being cold, however pt appeased with blanket and change in thermostat.   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   Pt requesting to stay in [facility], pt able to ask why are they not saying any words in reference to the tv being muted.   Pt seated in [facility] with all needs within reach and seatbelt alarm on. Nurse aware of pt positioning.   Treatment Session 2   Pt seated in WC upon arrival. Pt agreeable to therapy. Pt denies any pain.   Pt transported dependent in [facility] to day room for time/energy conservation.   Pt performed 1x8 biceps curls B with 2# dowel rod, and 1x6 chest press with 2# dowel rod.   Pt participating in 1:1 dance group activity focusing on endurance, UE/LE strengthening; therapist provided verbal and tactile cues for completion within available range. Pt very  appreciative of dance activity and peer support.   Pt participating in dance group activities at end of session with nurse tech present.   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), RLE DVT (no limitations according to H&P) Restrictions Weight Bearing Restrictions Per Provider Order: No  Therapy/Group: Individual Therapy  [facility], Leon, DPT  04/26/2023, 11:12  AM

## 2023-04-26 NOTE — Progress Notes (Signed)
 Nurse walked into room beginning of shift, Previous nurse Courtney in room with charge nurse obtaining vitals stated EKG already done. Patient is laying in bed, crying, clutching middle of chest and upper abdomen. Patient will point across the room. Patient starts talking, then cries and does not complete statements. Nurse asked if she was in pain, patient nodded, yes and clutches chest. 1915 lab drew troponin. 1923 Rapid Nurse Alm entered, orders for nitroglycerin  given 1930. Vitals taken at 1929 BP147/84 P104 O2 99% previous to medication being given. Rechecked vitals 1935 177/69 P90 O2 97%. Patient continues to cry and grab chest, stomach, and then point across the room. Both nurses attempt to get patient to verbally say what is wrong patient with state, the two, the two of them then start crying again. Waiting for troponin results patient continues to cry. Patient stops cry and appears to be calming down states, its better. 1954 BP 151/55 P92 O2 96%.  Awaiting lab results. Patient stated, I'll show you. Patient no longer crying, ambulates with walker and nurse to nurses computer and point at medications then stomach. Nurse described how to the medication are scanned. Patient then points at her meds and nurse tells her what they are and what they are for. Patient shakes head no at linzess  and rubs stomach. Nurse tells patients she will go over all meds and what they are for and she can see which one she is taking. Patient states, ok then starts ambulating with walker back to bed, nurse assists. Patient took pm medication without difficulty and drank class of cranberry juice. Oncall NP Fidela Ned aware of patients condition, labs, and EKG, talked with nurse severaltimes this shift.

## 2023-04-26 NOTE — Progress Notes (Signed)
 Occupational Therapy Session Note  Patient Details  Name: Brittany [name] MRN: [id number] Date of Birth: 03/10/1966  Today's Date: 04/26/2023 OT Individual Time: 1000-1100 OT Individual Time Calculation (min): 60 min    Short Term Goals: Week 1:  OT Short Term Goal 1 (Week 1): STGs=LTGs due to patient's estimated length of stay.  Skilled Therapeutic Interventions/Progress Updates:   Pt seen for skilled OT session with pt bed level upon arrival and requesting via OT assisting with communication board and gestures to emergently use BSC. Pt transferred with CGA and min cues for safety to limit impulsivity. Doffed brief and voided and had BM. Set up for buttocks and peri hygiene. SPT back to EOB for UB sponge bathing and grooming with set up, UB pullover shirt with set up and LB brief with CGA once taped and pull on pants with CGA. Pt then required back on commode emergently with more loose stool. CGA for transfer and pants management. Nursing made aware and pt left in care of NT at end of session.   Pain: denies any pain   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), RLE DVT (no limitations according to H&P) Restrictions Weight Bearing Restrictions Per Provider Order: No   Therapy/Group: Individual Therapy  [name] 04/26/2023, 9:24 AM

## 2023-04-26 NOTE — Progress Notes (Signed)
 Speech Language Pathology Daily Session Note  Patient Details  Name: Brittany [name] MRN: [id number] Date of Birth: 1965/06/26  Today's Date: 04/26/2023 SLP Individual Time: 9191-9083 SLP Individual Time Calculation (min): 68 min  Short Term Goals: Week 1: SLP Short Term Goal 1 (Week 1): Patient will answer basic environmental and biographical yes/no questions with 90% accuracy given max assist. SLP Short Term Goal 2 (Week 1): Patient will follow 1-step directions with 90% accuracy given max assist. SLP Short Term Goal 3 (Week 1): Patient will verbalize trained word-level functional utterances in 8/10 opportunities given max assist. SLP Short Term Goal 4 (Week 1): Patient will demonstrate use of recommended swallow safety strategies by clearing residue from the right buccal cavity during solid consumption in 4/5 opportunities given max verbal cues.  Skilled Therapeutic Interventions: SLP conducted skilled therapy/evaluation session to target assessment of subjective swallowing function (findings in separate evaluation note) and to address communication goals. Patient exhibiting much improved verbalizations of spontaneous and automatic speech, greeted SLP with good morning and answered many questions appropriately and verbally towards beginning of session. As session progressed, patient and SLP discussed family members using pictures available in the room. Patient benefited from max to total verbal cues to identify family members by name and to verbalize each name. Patient then answered basic environmental yes/no questions with 25% accuracy given max assist. During object identification task, patient benefited from max cues to identify objects present in a picture by pointing with 40% accuracy. At end of session, SLP briefly left room to obtain swallowing sign to hang above patient's bed. Upon SLP return, patient had climbed out of bed over the handrails to reach the bedside commode despite bed  alarm. SLP encouraged patient to utilize call bell when needing to get up and called NT into room to provide supervision to patient prior to exit. SLP will continue to target goals per plan of care.       Pain Pain Assessment Pain Scale: 0-10 Pain Score: 0-No pain  Therapy/Group: Individual Therapy  Rosina Downy, M.A., CCC-SLP  Adriella Essex A Jarielys Girardot 04/26/2023, 12:28 PM

## 2023-04-26 NOTE — Progress Notes (Signed)
 Pt woke up for medication at hs, did not speak but took medication and water  without issue. Patient made 2 attempts to go to bsc without assistance during night shift bed alarm on, bed in low position. Educated patient. Medication given to patient with morning, roused easily. Patient started using call light at 0515 this morning asking for assistance with water , head raised and for a cup. Patient said goodbye to night nurse.

## 2023-04-26 NOTE — Progress Notes (Signed)
 Pt noted with crying and holding left breast/chest. Unable to elaborate on symptoms due to aphasia. Pointing at computer and crying. Vitals assessed. Fidela NP made aware. EKG done and awaiting troponin levels. Reported off to oncoming nurse to contact [facility] with results.    Geralynn LELON Earl, RN

## 2023-04-26 NOTE — Progress Notes (Signed)
 Patient now resting in bed with eyes closed, respirations observed even and unlabored, no c/o of pain.

## 2023-04-26 NOTE — Plan of Care (Signed)
  Problem: Consults Goal: RH STROKE PATIENT EDUCATION Description: See Patient Education module for education specifics  Outcome: Progressing   Problem: Consults Goal: Diabetes Guidelines if Diabetic/Glucose > 140 Description: If diabetic or lab glucose is > 140 mg/dl - Initiate Diabetes/Hyperglycemia Guidelines & Document Interventions  Outcome: Progressing   Problem: RH BOWEL ELIMINATION Goal: RH STG MANAGE BOWEL WITH ASSISTANCE Description: STG Manage Bowel with  MOD I Assistance. Outcome: Progressing   Problem: RH BOWEL ELIMINATION Goal: RH STG MANAGE BOWEL W/MEDICATION W/ASSISTANCE Description: STG Manage Bowel with Medication with Assistance. Outcome: Progressing   Problem: RH BLADDER ELIMINATION Goal: RH STG MANAGE BLADDER WITH ASSISTANCE Description: STG Manage Bladder with Mod I Assistance Outcome: Progressing   Problem: RH SAFETY Goal: RH STG ADHERE TO SAFETY PRECAUTIONS W/ASSISTANCE/DEVICE Description: STG Adhere to Safety Precautions With Assistance/Device. Outcome: Progressing   Problem: RH KNOWLEDGE DEFICIT Goal: RH STG INCREASE KNOWLEDGE OF STROKE PROPHYLAXIS Description: Manage secondary risk factors / stroke prophylaxis with educational materials  provided  with cues/ supervision Outcome: Progressing   Problem: RH KNOWLEDGE DEFICIT Goal: RH STG INCREASE KNOWLEGDE OF HYPERLIPIDEMIA Description: Manage HDL with educational materials  provided with cues/ supervision Outcome: Progressing   Problem: RH KNOWLEDGE DEFICIT Goal: RH STG INCREASE KNOWLEDGE OF DYSPHAGIA/FLUID INTAKE Description: Manage dysphagia with educational materials provided with MOD I assist Outcome: Progressing   Problem: RH KNOWLEDGE DEFICIT Goal: RH STG INCREASE KNOWLEDGE OF HYPERTENSION Description: Manage HTN with educational materials provided  with cues/ supervision Outcome: Progressing

## 2023-04-27 ENCOUNTER — Inpatient Hospital Stay (HOSPITAL_COMMUNITY): Payer: Medicaid Other

## 2023-04-27 DIAGNOSIS — F54 Psychological and behavioral factors associated with disorders or diseases classified elsewhere: Secondary | ICD-10-CM | POA: Diagnosis not present

## 2023-04-27 DIAGNOSIS — I82411 Acute embolism and thrombosis of right femoral vein: Secondary | ICD-10-CM | POA: Diagnosis not present

## 2023-04-27 DIAGNOSIS — R1031 Right lower quadrant pain: Secondary | ICD-10-CM | POA: Diagnosis not present

## 2023-04-27 DIAGNOSIS — I10 Essential (primary) hypertension: Secondary | ICD-10-CM | POA: Diagnosis not present

## 2023-04-27 DIAGNOSIS — I63512 Cerebral infarction due to unspecified occlusion or stenosis of left middle cerebral artery: Secondary | ICD-10-CM | POA: Diagnosis not present

## 2023-04-27 DIAGNOSIS — R109 Unspecified abdominal pain: Secondary | ICD-10-CM | POA: Diagnosis not present

## 2023-04-27 LAB — PLATELET INHIBITION P2Y12: Platelet Function  P2Y12: 1 [PRU] — ABNORMAL LOW (ref 182–335)

## 2023-04-27 MED ORDER — TICAGRELOR 90 MG PO TABS
45.0000 mg | ORAL_TABLET | Freq: Two times a day (BID) | ORAL | Status: DC
  Administered 2023-04-27 – 2023-05-03 (×12): 45 mg via ORAL
  Filled 2023-04-27 (×12): qty 1

## 2023-04-27 MED ORDER — METOPROLOL TARTRATE 12.5 MG HALF TABLET
12.5000 mg | ORAL_TABLET | Freq: Two times a day (BID) | ORAL | Status: DC
  Administered 2023-04-27 – 2023-05-02 (×10): 12.5 mg via ORAL
  Filled 2023-04-27 (×10): qty 1

## 2023-04-27 NOTE — Progress Notes (Signed)
 Occupational Therapy Session Note  Patient Details  Name: Brittany [name] MRN: [id number] Date of Birth: March 15, 1966  Today's Date: 04/28/2023 OT Individual Time: 9199-9144 OT Individual Time Calculation (min): 55 min    Short Term Goals: Week 2:  OT Short Term Goal 1 (Week 2): STGs=LTGs due to patient's estimated length of stay.  Skilled Therapeutic Interventions/Progress Updates:   Pt received resting in bed, LPN present, session focused on BADL retraining and functional mobility with LRAD. Un-rated pain present in abdomin, radiating down leg, LPN made aware, rest provided as needed. Pt performs all sit<>stand and ambulatory walk-in shower transfer with close supervision + no AD. Full-body bathing with intermeidate use of grab bar for standing pericare. Plan for standing shower once activity tolerance improves. Full-body dressing with supervision + cuing for sequencing of doffing/donning garments. Rollator introduced for means of energy conservation and patient previously use rollator. Cuing required for management of brakes, overall supervision for mobility, although decreased gait speed due to fatigue. Household level mobility with x2 seated rest breaks, HR stable. Pt remained sitting in recliner, posey belt and telesitter activated. LPN updated on patient location.   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

## 2023-04-27 NOTE — Plan of Care (Signed)
  Problem: Consults Goal: RH STROKE PATIENT EDUCATION Description: See Patient Education module for education specifics  Outcome: Progressing   Problem: RH BOWEL ELIMINATION Goal: RH STG MANAGE BOWEL WITH ASSISTANCE Description: STG Manage Bowel with  MOD I Assistance. Outcome: Progressing   Problem: RH BOWEL ELIMINATION Goal: RH STG MANAGE BOWEL W/MEDICATION W/ASSISTANCE Description: STG Manage Bowel with Medication with Assistance. Outcome: Progressing   Problem: RH BLADDER ELIMINATION Goal: RH STG MANAGE BLADDER WITH ASSISTANCE Description: STG Manage Bladder with Mod I Assistance Outcome: Progressing   Problem: RH SAFETY Goal: RH STG ADHERE TO SAFETY PRECAUTIONS W/ASSISTANCE/DEVICE Description: STG Adhere to Safety Precautions With Assistance/Device. Outcome: Progressing   Problem: RH KNOWLEDGE DEFICIT Goal: RH STG INCREASE KNOWLEGDE OF HYPERLIPIDEMIA Description: Manage HDL with educational materials  provided with cues/ supervision Outcome: Progressing   Problem: RH KNOWLEDGE DEFICIT Goal: RH STG INCREASE KNOWLEDGE OF DYSPHAGIA/FLUID INTAKE Description: Manage dysphagia with educational materials provided with MOD I assist Outcome: Progressing   Problem: RH KNOWLEDGE DEFICIT Goal: RH STG INCREASE KNOWLEDGE OF DIABETES Description: Manage DM with educational materials  provided  with cues/ supervision Outcome: Progressing

## 2023-04-27 NOTE — Progress Notes (Signed)
 Continues to demonstrate impulsivity. Observed smiling and having episode of tearfulness at the same time. Reminded about using the call bell to have staff assist her to go the bathroom. Call bell placed by patient's right hand and explained press red button for assistance. Reports feeling warm and hot not wanting blankets. Oral care provided for patient tongue coated white.

## 2023-04-27 NOTE — Plan of Care (Signed)
  Problem: Consults Goal: RH STROKE PATIENT EDUCATION Description: See Patient Education module for education specifics  Outcome: Progressing   Problem: RH BOWEL ELIMINATION Goal: RH STG MANAGE BOWEL WITH ASSISTANCE Description: STG Manage Bowel with  MOD I Assistance. Outcome: Progressing Goal: RH STG MANAGE BOWEL W/MEDICATION W/ASSISTANCE Description: STG Manage Bowel with Medication with Assistance. Outcome: Not Progressing   Problem: RH BLADDER ELIMINATION Goal: RH STG MANAGE BLADDER WITH ASSISTANCE Description: STG Manage Bladder with Mod I Assistance Outcome: Progressing   Problem: RH SAFETY Goal: RH STG ADHERE TO SAFETY PRECAUTIONS W/ASSISTANCE/DEVICE Description: STG Adhere to Safety Precautions With Assistance/Device. Outcome: Not Progressing   Problem: RH COGNITION-NURSING Goal: RH STG USES MEMORY AIDS/STRATEGIES W/ASSIST TO PROBLEM SOLVE Description: STG Uses Memory Aids/Strategies With  MOD I Assistance to Problem Solve. Outcome: Progressing   Problem: RH PAIN MANAGEMENT Goal: RH STG PAIN MANAGED AT OR BELOW PT'S PAIN GOAL Description: <4 w/ prns Outcome: Not Progressing   Problem: RH KNOWLEDGE DEFICIT Goal: RH STG INCREASE KNOWLEDGE OF HYPERTENSION Description: Manage HTN with educational materials provided  with cues/ supervision Outcome: Progressing Goal: RH STG INCREASE KNOWLEDGE OF DYSPHAGIA/FLUID INTAKE Description: Manage dysphagia with educational materials provided with MOD I assist Outcome: Progressing

## 2023-04-27 NOTE — Significant Event (Signed)
 Rapid Response Event Note   Reason for Call : CP Initial Focused Assessment:  I was notified by nursing staff pt was c/o CP by pointing her hand over her heart and crying. Pt is aphasic. Staff was instructed to obtain a 12 lead EKG and vital signs while I was on my way to bedside. Upon arrival, [name] was awake, still pointing to her chest. 12 lead showed SR with no STE. Vital signs stable. SL NTG ordered for CP. I instructed staff to communicate event with the provider and receive further orders.   1923-97.14F, HR 94 SR, 147/84, RR 18 with sats 98% on RA.   Interventions:  -12 lead EKG -SL NTG 0.4mg  tab. May repeat every 5 mins for CP for total of 3 doses.         MD Notified: Fidela Ned NP per primary RN Call Time: 1902 Arrival Time: 1920 End Time: 1925  Brittany [name] ORN, RN

## 2023-04-27 NOTE — Progress Notes (Signed)
 Speech Language Pathology Daily Session Note  Patient Details  Name: Brittany [name] MRN: [id number] Date of Birth: 1965/09/06  Today's Date: 04/27/2023 SLP Individual Time: 0830-0928 SLP Individual Time Calculation (min): 58 min  Short Term Goals: Week 1: SLP Short Term Goal 1 (Week 1): Patient will answer basic environmental and biographical yes/no questions with 90% accuracy given max assist. SLP Short Term Goal 2 (Week 1): Patient will follow 1-step directions with 90% accuracy given max assist. SLP Short Term Goal 3 (Week 1): Patient will verbalize trained word-level functional utterances in 8/10 opportunities given max assist. SLP Short Term Goal 4 (Week 1): Patient will demonstrate use of recommended swallow safety strategies by clearing residue from the right buccal cavity during solid consumption in 4/5 opportunities given max verbal cues.  Skilled Therapeutic Interventions:   Pt seen for skilled SLP intervention to address speech goals. Upon arrival, pt expressing significant pain in R sided abdomen and groin region. RN and MD aware. Her pain did seem to subside as session progressed as she was able to participate in full session. We reviewed family members with pictures in room. Pt challenged to verbalize family member names, though was able to copy their names on dry erase board. Facilitated biographical and abstract Y/N questions. Pt demonstrated >90% accuracy for biographical questions, though was challenged with abstract Y/N questions and answered <50% correctly. Facilitated practice of verbal production of vowels, counting 1-10, and happy birthday song. Pt required max assist to achieve ~50% accuracy across tasks. 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 Pt repeatedly indicated that she is an independent person. SLP verbalized understanding and educated pt on purpose of SLP intervention to  aid her communication abilities. Pt left sitting upright in bed with call bell in reach and bed alarm set. Continue SLP PoC.   Pain Pain Assessment Pain Scale: 0-10 Pain Score: 7  (RN and MD notified)  Therapy/Group: Individual Therapy  Peyton JINNY Rummer 04/27/2023, 10:16 AM

## 2023-04-27 NOTE — Progress Notes (Signed)
 Occupational Therapy Weekly Progress Note  Patient Details  Name: Brittany [name] MRN: [id number] Date of Birth: 02-Jun-1965  Beginning of progress report period: April 21, 2023 End of progress report period: April 27, 2023  Today's Date: 04/27/2023 OT Individual Time: 1100-1200 OT Individual Time Calculation (min): 60 min    Patient has been working toward all S/LTG's since CIR admission. Pt has improved the past few session with safety and impulsivity with cueing as well as functional verbal expressive language within functional context. OT using communication boards as well. Pt had some medical issues related to lab values and chest pain r/o with OT carrying over safety and bending precautions to avoid falls. Pt continues to require CIR and OT to progress to d/c home with mother and father with DME and f/u OT.   Patient continues to demonstrate the following deficits: muscle weakness and muscle paralysis, decreased cardiorespiratoy endurance, impaired timing and sequencing, unbalanced muscle activation, motor apraxia, ataxia, decreased coordination, and decreased motor planning, decreased initiation, decreased attention, decreased awareness, decreased problem solving, decreased safety awareness, decreased memory, and delayed processing, and decreased standing balance, decreased postural control, hemiplegia, and decreased balance strategies and therefore will continue to benefit from skilled OT intervention to enhance overall performance with BADL, iADL, and Reduce care partner burden.  Patient progressing toward long term goals..  Continue plan of care.  OT Short Term Goals Week 1:  OT Short Term Goal 1 (Week 1): STGs=LTGs due to patient's estimated length of stay. OT Short Term Goal 1 - Progress (Week 1): Progressing toward goal Week 2:  OT Short Term Goal 1 (Week 2): STGs=LTGs due to patient's estimated length of stay.  Skilled Therapeutic Interventions/Progress Updates:   Pt  seen for skilled OT session this am. Telesitter active and paused due to full self care and toileting retraining session then called at end of session to re-activate. Pt completed toileting with HHA and close S to and from bathroom with commode topper with set up and close S for peri bathing and hygiene. Seated sink side sponge bathing due to medical rec due to cardiology w/u and UB dressing and grooming with set up. No cues for sequencing. CGA for briefs made into underwear and pull on pants with demo and cues for figure 4 technique to reduce bending. Pt returned bed level for resting as per pt gesture and some verbalizations. Family arrived, pt's mom and dad, and confirmed pt would benefit from [place], 3 in 1 commode and TTB due to tub shower. Bed exit, Telesitter, needs and nurse call button in reach.   Pain: no grimacing or outright pain demonstrated throughout session and when using the comm board, denies pain on faces scale   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), RLE DVT (no limitations according to H&P) Restrictions Weight Bearing Restrictions Per Provider Order: No   Therapy/Group: Individual Therapy  [name] 04/27/2023, 7:51 AM

## 2023-04-27 NOTE — Progress Notes (Signed)
 PROGRESS NOTE   Subjective/Complaints:  Pt working with SLP this AM. She continues to reports RLQ discomfort.  Had EKG/Trop yesterday for chest discomfort, although possible she may have been trying to communicate her lower abdominal pain. .   ROS: limited due to aphasia.   Objective:   CT HEAD WO CONTRAST ( ) Result Date: 04/25/2023 CLINICAL DATA:  Stroke, follow up EXAM: CT HEAD WITHOUT CONTRAST TECHNIQUE: Contiguous axial images were obtained from the base of the skull through the vertex without intravenous contrast. RADIATION DOSE REDUCTION: This exam was performed according to the departmental dose-optimization program which includes automated exposure control, adjustment of the mA and/or kV according to patient size and/or use of iterative reconstruction technique. COMPARISON:  CT head April 24, 2023. FINDINGS: Brain: Stable hemorrhagic infarct in the left MCA territory. No progressive mass effect or progressive hemorrhage. No hydrocephalus. No visible mass lesion. Vascular: Left M1 stent. Skull: No acute fracture. Sinuses/Orbits: Clear sinuses.  No acute orbital findings. Other: No mastoid effusions. IMPRESSION: Stable hemorrhagic infarct in the left MCA territory. No progressive mass effect or progressive hemorrhage. Electronically Signed   By: Gilmore GORMAN Molt M.D.   On: 04/25/2023 14:51   Recent Labs    04/24/23 1414 04/26/23 0532  WBC 4.4 4.7  HGB 13.8 11.5*  HCT 40.6 34.7*  PLT 186 155   Recent Labs    04/25/23 1150 04/26/23 0532  NA 137 139  K 3.7 3.7  CL 109 110  CO2 19* 19*  GLUCOSE 94 85  BUN 16 13  CREATININE 1.09* 0.92  CALCIUM  8.5* 8.0*         Intake/Output Summary (Last 24 hours) at 04/27/2023 0917 Last data filed at 04/27/2023 0800 Gross per 24 hour  Intake 520 ml  Output 425 ml  Net 95 ml        Physical Exam: Vital Signs Blood pressure (!) 162/77, pulse 69, temperature 97.9 F (36.6  C), temperature source Axillary, resp. rate (!) 22, height 5' 2 (1.575 m), weight 67.4 kg, SpO2 100%.  Constitutional: Laying in bed, Alert and wake, NAD HENT:   Atraumatic, normocephalic. Eyes: PERRLA. EOM seem intact. No notable nystagmus.  Cardiovascular: RRR, no murmurs/rub/gallops. No Edema. Peripheral pulses 2+  Respiratory: CTAB. No rales, rhonchi, or wheezing. On RA.  Abdomen: soft, + bowel sounds, mildly tender RLQ, nondistended Skin: C/D/I. No apparent lesions. Extremities: R groin with large hematoma in the inguinal canal area, mildly tender, buising to area noted unchanged from prior days Neuro: Alert awake, answering yes/no questions and gesturing for communication, following simple commands Continued aphasia appears more expressive, dysarthric voice, mild right facial weakness Moving all 4 extremities to gravity and resistance   Assessment/Plan: 1. Functional deficits which require 3+ hours per day of interdisciplinary therapy in a comprehensive inpatient rehab setting. Physiatrist is providing close team supervision and 24 hour management of active medical problems listed below. Physiatrist and rehab team continue to assess barriers to discharge/monitor patient progress toward functional and medical goals  Care Tool:  Bathing    Body parts bathed by patient: Right arm, Left arm, Chest, Abdomen, Front perineal area, Buttocks, Right upper leg, Left upper leg, Right lower  leg, Left lower leg, Face         Bathing assist Assist Level: Contact Guard/Touching assist     Upper Body Dressing/Undressing Upper body dressing   What is the patient wearing?: Hospital gown only    Upper body assist Assist Level: Minimal Assistance - Patient > 75%    Lower Body Dressing/Undressing Lower body dressing      What is the patient wearing?: Underwear/pull up, Pants     Lower body assist Assist for lower body dressing: Minimal Assistance - Patient > 75%      Toileting Toileting    Toileting assist Assist for toileting: Contact Guard/Touching assist     Transfers Chair/bed transfer  Transfers assist     Chair/bed transfer assist level: Minimal Assistance - Patient > 75%     Locomotion Ambulation   Ambulation assist      Assist level: Minimal Assistance - Patient > 75% Assistive device: Hand held assist (HHA or using IV pole) Max distance: 75 ft   Walk 10 feet activity   Assist     Assist level: Contact Guard/Touching assist Assistive device: Other (comment) (IV pole)   Walk 50 feet activity   Assist    Assist level: Minimal Assistance - Patient > 75% Assistive device: Other (comment) (IV pole)    Walk 150 feet activity   Assist Walk 150 feet activity did not occur: Safety/medical concerns         Walk 10 feet on uneven surface  activity   Assist Walk 10 feet on uneven surfaces activity did not occur: Safety/medical concerns         Wheelchair     Assist Is the patient using a wheelchair?: Yes Type of Wheelchair: Manual    Wheelchair assist level: Dependent - Patient 0% Max wheelchair distance: 250    Wheelchair 50 feet with 2 turns activity    Assist        Assist Level: Dependent - Patient 0%   Wheelchair 150 feet activity     Assist      Assist Level: Dependent - Patient 0%   Blood pressure (!) 162/77, pulse 69, temperature 97.9 F (36.6 C), temperature source Axillary, resp. rate (!) 22, height 5' 2 (1.575 m), weight 67.4 kg, SpO2 100%.  Medical Problem List and Plan: 1. Functional deficits secondary to left MCA infarct due to severe left MCA stenosis; notable Hx prior L cerebellar CVAs             -patient may not shower while on continuous heparin  drip             -ELOS/Goals: 10 days, SPV PT/OT, Min A SLP             -04/21/23 CIR evals today; ordered SLP eval since it wasn't on there  -Continue CIR  -Repeat CT 2/4- discussed with neuro felt to be stable,  neuro checking repeat CT today 2/5 and  ordered P2y12 test- 1 2.  R-CFV DVT (found 1/31)/Antithrombotics: -DVT/anticoagulation:  Pharmaceutical: Heparin  low dose X 48 hours and if stable transition to DOAC              -antiplatelet therapy: Brillinta. ASA d/c due to addition of Heparin .  - Per lit review, early mobilization current recommendation, without gross restrictions; no convincing evidence of increased PE risk.   -04/22/23 long discussion with pt and brother regarding clot location, this coincides with area of pain, large palpable ?hematoma to R groin; had mechanical thrombectomy-- called  IR Dr. Juliene Balder who relayed to the team, Delon Beagle NP will come see the patient, appreciate their assistance with this case. For now, added Tramadol  100mg  q8h PRN to help with pain.  -04/23/23 Vas U/S with hematoma/continued DVT-smaller, Pt seen by IR this AM - recommend avoiding bending or stooping, limited mobility- transfers only for 1-2 days.  Ok to transition to eliquis   -2/6 Dr. 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 Pain Management:  Tylenol  prn. Gabapentin  300mg  TID -04/22/23 added Tramadol  100mg  q8h PRN for pain in R groin (see above)-- pt allergic/intolerant to other hydro/oxycodone .  -2/4 scheduled Tylenol  -2/6 appears comfortable today, continue current regimen 4. Mood/Behavior/Sleep: LCSW to follow for evaluation and support.              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  Neuropsych/cognition: This patient is not capable of making decisions on her own behalf. 6. Skin/Wound Care: Routine pressure relief measures.  7. Fluids/Electrolytes/Nutrition: Monitor I/O. Check routine labs -04/21/23 Cr 1.19 a little up from yesterday but down from prior, monitor; K 3.3, see below; remainder of CMP unremarkable, monitor labs 8. HTN: Monitor BP TID--continue amlodipine  10mg  daily and hydralazine  50mg  q8h             --avoid hypoperfusion.   -2/4 intermittently elevated, continue to monitor trend for now.  Avoid hypotension  2/7 BP has been elevated, will start low-dose metoprolol  12.5 mg twice daily Vitals:   04/26/23 1552 04/26/23 1856 04/26/23 1923 04/26/23 1936  BP: (!) 165/68 (!) 180/63 (!) 147/84 (!) 177/69   04/26/23 1938 04/26/23 2022 04/26/23 2049 04/27/23 0300  BP: (!) 168/68 (!) 152/61 (!) 152/61 118/61   04/27/23 0613 04/27/23 0616 04/27/23 0732 04/27/23 0800  BP: (!) 151/73 (!) 151/73 (!) 148/81 (!) 162/77    9. RLQ pain:  Felt to be due to IBS. Has been on miralax  daily and Senna S 2 tabs nightly without results --Linzess  72mcg every day was started today. Will get KUB  -04/21/23 KUB with gas filled colon but otherwise neg/nonobstructive; reviewed images myself, suspect stool burden contributory to gas trapped in R side; will increase miralax /colace to BID, leave senna S as is, add sorbitol  30ml PRN if no BM today; has PRN dulcolax supp and fleet if needed too; monitor closely -04/22/23 pt insists she's had BMs daily, but no documentation; no abd pain today, appears it's R groin pain-- see above #2-- tramadol  added, leave stool softeners in place, NEED DOCUMENTATION OF STOOLS CT abdomen on 2/4 neg for acute changes, mod stool -2/6 LBM today having more regular bowel movements -2/7-nursing reports patient's pain worsened after her Linzess , family indicates she may have been having pain with this medication previously.  Discussed with GI.  Will discontinue Linzess .  Will  check x-ray for stool burden.   10. Hypokalemia: Supplement added X 2 days.  -04/21/23 K 3.3, potassium supplement not ordered, 40mEq daily x2 days added today -2/6 3.7 today 11. Hyperlipidemia: LDL-124 on Lipitor  80 mg and Zetia  10mg   daily.  12. Pre-diabetes: Hgb A1c- 5.9 and every 4 hours CBS have shown BS controlled. --Will monitor fasting BS on serial labs.  13. Substance abuse: Educated on stroke risk w/marijuana.  14. H/o MM: Acute renal failure improving SCr 1.11 @ admission-->1.59-->1.07  -04/21/23 Cr 1.19, monitor  -2/3 Cr improved to 0.97  Recheck labs, start IVF NS 20ml/hr due to poor intake   2/6 creatinine down to 0.92, DC IVF and encourage oral fluids 15. GERD: On protonix  40mg  daily 16. Chest pain episode 2/6  -EKG NSR, Trop did not trend up. Pain resolved-suspect this may have been more abdominal pain 17. RLQ abdominal pain        LOS: 7 days A FACE TO FACE EVALUATION WAS PERFORMED  Murray Collier 04/27/2023, 9:17 AM

## 2023-04-27 NOTE — Progress Notes (Signed)
 Occupational Therapy Session Note  Patient Details  Name: Brittany [name] MRN: [id number] Date of Birth: 06/01/1965  Today's Date: 04/27/2023 OT Individual Time: 1351-1418 OT Individual Time Calculation (min): 27 min    Short Term Goals: Week 1:  OT Short Term Goal 1 (Week 1): STGs=LTGs due to patient's estimated length of stay. OT Short Term Goal 1 - Progress (Week 1): Progressing toward goal  Skilled Therapeutic Interventions/Progress Updates:  Pt greeted supine in bed, pt agreeable to OT intervention.    Pt unable to verbalize name but able to state yes when choices provided.   Transfers/bed mobility/functional mobility: pt completed supine>sit with supervision. Stand pivot to w/c with no AD and close CGA.   Therapeutic activity:  Pt completed various therapeutic activities in gym to improve word finding, problem solving, functional mobility, endurance and global strength to improve safety and participation with ADLS. Pt positioned at table with pt shown various pictures of food items such as chips, grapes, chicken, sour cream etc. Pt instructed to locate category of foods such as which one is the fruit. Pt unable to locate any items based off of categories but pt was able to identify items when directly asked which one is the chicken?  Pt completed functional ambulation task with pt instructed to flip over one playing card at a time and then ambulate ~ 8 ft to match card to mirror. Pt able to manipulate all cards with RUE with no deficits noted. Pt completed functional ambulation with no AD and CGA- supervision. Pt did require MIN cues to locate correct match,emphasis on attempting to verbalize number. Pt inconsistently stated numbers correctly however pt was able to demonstrate numbers correctly using her finger.     Ended session with pt supine in bed with all needs within reach and bed alarm activated.                    Therapy Documentation Precautions:   Precautions Precautions: Fall Precaution Comments: R-hemi, R-inattention, watch BP, global aphasia (expressive>receptive), RLE DVT (no limitations according to H&P) Restrictions Weight Bearing Restrictions Per Provider Order: No  Pain: No s/s of pain during session    Therapy/Group: Individual Therapy  Ronal Mallie Needy 04/27/2023, 3:13 PM

## 2023-04-27 NOTE — Progress Notes (Signed)
 Physical Therapy Session Note  Patient Details  Name: Brittany [name] MRN: [id number] Date of Birth: 1965-12-13  Today's Date: 04/27/2023 PT Individual Time: 8584-8546 PT Individual Time Calculation (min): 38 min  Today's Date: 04/27/2023 PT Missed Time: 7 Minutes Missed Time Reason: Patient fatigue  Short Term Goals: Week 1:  PT Short Term Goal 1 (Week 1): Pt will perform bed mobility from flat bed surface and no rails with Mod I. PT Short Term Goal 2 (Week 1): Pt will perform functional transfers with no AD and supervision. PT Short Term Goal 3 (Week 1): Pt will ambulate short, household distances with no AD and close supervision. PT Short Term Goal 4 (Week 1): Pt will ambulate community distances using rollator with supervision. PT Short Term Goal 5 (Week 1): Pt wil complete outcome measure.  Skilled Therapeutic Interventions/Progress Updates:   Per MD, pt now cleared for ambulation. Received pt supine in bed asleep. Upon wakening, pt agreeable to PT treatment and denied any pain during session but appeared fatigued throughout session. Session with emphasis on functional mobility/transfers, generalized strengthening and endurance, dynamic standing balance/coordination, and gait training. Pt performed bed mobility with HOB slightly elevated and mod I x 2 trials throughout session. Pt impulsively stood to transfer into [facility] before therapist could don gait belt and lock WC brakes.   Pt transported to/from room in [facility] dependently for time management purposes. Stood from [facility] without AD and CGA and ambulated 24ft with min HHA. Pt required encouragement to ambulate, initally shaking head and arms no and moaning - limited distance due to fatigue. Then performed seated BUE/BLE strengthening on Nustep at workload 2 for 8 minutes for a total of 221 steps with emphasis on cardiovascular endurance - 1 rest break; HR 74bpm afterwards. Pt required gentle encouragement to continue, frequently closing eyes,  moaning and groaning, and indicating fatigue - denied any pain. Ambulated ~62ft without AD and CGA to WC (pt keeping eyes closed while ambulating) and continuing to indicate fatigue. Returned to room, stood from [facility] without AD and CGA, and ambulated back to bed with CGA. Concluded session with pt supine in bed asleep, needs within reach, and bed alarm on with Telesitter in place. 7 minutes missed of skilled physical therapy due to fatigue.    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), RLE DVT (no limitations according to H&P) Restrictions Weight Bearing Restrictions Per Provider Order: No  Therapy/Group: Individual Therapy Therisa HERO Zaunegger Therisa Stains PT, DPT 04/27/2023, 7:20 AM

## 2023-04-27 NOTE — Progress Notes (Signed)
 At shift change this morning complaining of discomfort to lower right abdomen and tender to touch. Explains pain radiating from right lower quadrant up to upper right to upper left quadrants of her abdomen. Explains sensation of a pressure and cramps going downwards. Attempting to have a BM today and did void in the [facility] this AM. Decreased appetite. Given PRN medication for flatulence and indigestion. Mid-morning reporting of leg cramps bilaterally to both legs. Continues to endorse abdominal discomfort. No chest pain reported by patient at this time.

## 2023-04-27 NOTE — Progress Notes (Signed)
 Patient laying in bed with eyes closed. Respirations even and unlabored, rise and fall of chest observed. 0 S/S of pain. Woke up patient for medication, took without difficulty. No behaviors observed this morning.

## 2023-04-27 NOTE — Progress Notes (Signed)
 Patient used call light, when nurse entered patient grabbed nurses hands to hold and started to c/o of stomach pain. Pt stated, cramping. Nurse sat with patient holding hand for 5 minutes talking to her. Pt calmed down, laying down with eyes closed. Call light wihtin reach. Bed alarm on.

## 2023-04-27 NOTE — Plan of Care (Signed)
 P2Y12 this am still at 1. Discussed with Dr. Alvira Josephs, will decreased brilinta  further to 45mg  bid.   Consuelo Denmark, MD PhD Stroke Neurology 04/27/2023 8:35 AM

## 2023-04-28 DIAGNOSIS — I63512 Cerebral infarction due to unspecified occlusion or stenosis of left middle cerebral artery: Secondary | ICD-10-CM | POA: Diagnosis not present

## 2023-04-28 DIAGNOSIS — I10 Essential (primary) hypertension: Secondary | ICD-10-CM | POA: Diagnosis not present

## 2023-04-28 DIAGNOSIS — K5901 Slow transit constipation: Secondary | ICD-10-CM | POA: Diagnosis not present

## 2023-04-28 DIAGNOSIS — F54 Psychological and behavioral factors associated with disorders or diseases classified elsewhere: Secondary | ICD-10-CM

## 2023-04-28 DIAGNOSIS — B370 Candidal stomatitis: Secondary | ICD-10-CM | POA: Diagnosis not present

## 2023-04-28 MED ORDER — NYSTATIN 100000 UNIT/ML MT SUSP
5.0000 mL | Freq: Four times a day (QID) | OROMUCOSAL | Status: DC
  Administered 2023-04-28 – 2023-05-03 (×18): 500000 [IU] via ORAL
  Filled 2023-04-28 (×20): qty 5

## 2023-04-28 NOTE — Progress Notes (Signed)
 Patient calm and cooperative throughout the night. Patient used call bell to c/o of stomach pain once an hour prior to routine tylenol  at 0200, educated patient on medication times. Nurse said she would be back in an hour, patient said okay. Routine tylenol  given with +effect. No complaints of pain this morning. Morning medication given tolerated well. Call light within reach. Bed alarm on. Telesitter being used.

## 2023-04-28 NOTE — Consult Note (Signed)
 Neuropsychological Consultation Comprehensive Inpatient Rehab   Patient:   Brittany [name]   DOB:   [birth year]  MR Number:  [id number]  Location:  [facility] [address] CENTER A [address] [place] [address] Dept: [phone] Loc: [phone]           Date of Service:   04/27/2023  Start Time:   10 AM End Time:   11 AM  Provider/Observer:  Norleen Asa, Psy.D.       Clinical Neuropsychologist       Billing Code/Service: [phone]  Reason for Service:    Brittany [name] is a 58 year old female referred for neuropsychological consultation during her admission to the comprehensive inpatient rehabilitation unit.  Patient was admitted due to functional deficits following recent left MCA territory stroke with scattered acute and subacute infarcts noted as well as indications of chronic left MCA infarct and chronic left cerebellar infarct.  While the patient has been improving some today's visit was very challenging and the patient was with disinterested in engaging in any type of discussion.  The patient had a very odd presentation and spoke in a very childlike manner during today's visit and we were unable to cover much ground due to her status.  However, the onset of our visit was delayed due to the patient having an extended nursing assistant going to the bathroom and she reported that she was continuing to have GI distress and attributed her disinterested in having discussion with me due to GI distress.  HPI for the current admission:    HPI:  Brittany [name] is a 58 year old female with history of HTN, GERD, anemia, MM, peripheral neuropathy, IBS, DOE, Left cerebellar CVA;   who was originally admitted on 04/12/23 with  reports of 1-2 weeks of speech changes and decline in cognition.  UDS positive for THC.  CT head done revealing subacute stroke in L-MCA territory and CTA revealed short segment occlusion in proximal  Left M2 and additional severe stenosis in insular left M2. MRI brain done revealing  scattered acute and subacute infarcts involving L-MCA distribution, few scattered superimposed subacute to chronic L-MCA infarct and chronic left cerebellar infarct. Neurology recommended DAPT X 3 weeks followed by ASA alone as well as zio patch for stroke felt to bed due to large vessel disease v/s cardioembolic v/s hypercoagulopathy. Not felt to be TNK candidate due to duration of symptoms.    Expressive deficits were improving but on 01/27, patient developed global aphasia with right facial droop and right sided weakness, left gaze preference and right hemianopia.  Follow up CTA showed proximal Left M1 occlusion with minimal collaterization of L-MCA which had progressed from 01/23. She underwent cerebral angio with mechanical thrombectomy of occluded L-MCA M1 segment with stent placement by Dr. Dolphus with TIC! 3 revascularization.  Post procedure on ASA/Brilinta  bid    2D echo with EF 60-65% with severe LVH, moderately dilated left atria. BSE done and patient started on D3, thins. Noted to have abdominal pain today limiting activity question IBS flare v/s constipation? She does have hx of small PFO noted on TEE in the past and BLE dopplers done which was positive for acute DVT in R-CFV and SF junction. Results dicussed with Dr. Jerri who recommended IV heparin  X 2 days and if stable to transition to DOAC--no limitation inactivity.    Patient with resultant right sided weakness with sensory deficits, right inattention due to hemiopia, global aphasia  and fatigue affecting mobility, ADLs and communication. CIR recommended due to functional decline.    Medical History:   Past Medical History:  Diagnosis Date   Anemia    Arthritis    trigger finger in left hand   Complication of anesthesia    per pt, hard to wake up!   Elevated cholesterol    Female bladder prolapse    per urologist, does not have prolaspe   GERD  (gastroesophageal reflux disease)    Heart murmur    pt unsure.   Hiatal hernia    Hypertension    IBS (irritable bowel syndrome)    Multiple myeloma (HCC) 2005   had partial chemo   Peripheral neuropathy    SOB (shortness of breath) on exertion    uses an inhaler   Stroke (HCC) 2017   paralysis left arm/uses a walker         Patient Active Problem List   Diagnosis Date Noted   Coping style affecting medical condition 04/28/2023   Acute deep vein thrombosis (DVT) of femoral vein of right lower extremity (HCC) 04/23/2023   Acute ischemic left middle cerebral artery (MCA) stroke (HCC) 04/20/2023   Middle cerebral artery stenosis, left 04/16/2023   Acute ischemic stroke (HCC) 04/12/2023   GERD (gastroesophageal reflux disease) 12/06/2022   Chronic osteoarthritis 12/06/2022   AKI (acute kidney injury) (HCC) 12/06/2022   Muscle spasm of both lower legs 12/06/2022   History of CVA (cerebrovascular accident) 12/06/2022   Class 1 obesity 12/06/2022   Hypertensive emergency 12/05/2022   Elevated troponin 12/05/2022   Aortic atherosclerosis (HCC) 07/10/2022   IBS (irritable bowel syndrome) 12/01/2021   Ganglion cyst of volar aspect of left wrist 05/20/2020   Trigger thumb of left hand 05/20/2020   Labral tear of shoulder, degenerative, right 05/20/2020   Anxiety 02/05/2019   Constipation 10/08/2018   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   B12 deficiency 12/19/2016   Cerebellar infarct (HCC)    Diastolic dysfunction    Marijuana abuse    Tachycardia    Hypokalemia    Stroke (cerebrum) (HCC) 12/05/2016   Occipital stroke (HCC) 12/05/2016   Hypertensive urgency 12/05/2016   Essential hypertension      Disposition/Plan:  I will try to revisit with the patient on Monday.          Electronically Signed   _______________________ Norleen Asa, Psy.D. Clinical Neuropsychologist

## 2023-04-28 NOTE — Plan of Care (Signed)
  Problem: RH KNOWLEDGE DEFICIT Goal: RH STG INCREASE KNOWLEDGE OF STROKE PROPHYLAXIS Description: Manage secondary risk factors / stroke prophylaxis with educational materials  provided  with cues/ supervision Outcome: Progressing   Problem: RH KNOWLEDGE DEFICIT Goal: RH STG INCREASE KNOWLEGDE OF HYPERLIPIDEMIA Description: Manage HDL with educational materials  provided with cues/ supervision Outcome: Progressing   Problem: RH KNOWLEDGE DEFICIT Goal: RH STG INCREASE KNOWLEDGE OF DYSPHAGIA/FLUID INTAKE Description: Manage dysphagia with educational materials provided with MOD I assist Outcome: Progressing   Problem: RH KNOWLEDGE DEFICIT Goal: RH STG INCREASE KNOWLEDGE OF HYPERTENSION Description: Manage HTN with educational materials provided  with cues/ supervision Outcome: Progressing   Problem: RH KNOWLEDGE DEFICIT Goal: RH STG INCREASE KNOWLEDGE OF DIABETES Description: Manage DM with educational materials  provided  with cues/ supervision Outcome: Progressing   Problem: RH PAIN MANAGEMENT Goal: RH STG PAIN MANAGED AT OR BELOW PT'S PAIN GOAL Description: <4 w/ prns Outcome: Progressing

## 2023-04-28 NOTE — Plan of Care (Signed)
  Problem: Consults Goal: RH STROKE PATIENT EDUCATION Description: See Patient Education module for education specifics  Outcome: Progressing   Problem: RH BOWEL ELIMINATION Goal: RH STG MANAGE BOWEL WITH ASSISTANCE Description: STG Manage Bowel with  MOD I Assistance. Flowsheets (Taken 04/28/2023 1748) STG: Pt will manage bowels with assistance: 5-Supervision/curing Goal: RH STG MANAGE BOWEL W/MEDICATION W/ASSISTANCE Description: STG Manage Bowel with Medication with Assistance. Outcome: Progressing   Problem: RH BLADDER ELIMINATION Goal: RH STG MANAGE BLADDER WITH ASSISTANCE Description: STG Manage Bladder with Mod I Assistance Outcome: Progressing Flowsheets (Taken 04/28/2023 1748) STG: Pt will manage bladder with assistance: 4-Minimal assistance   Problem: RH SAFETY Goal: RH STG ADHERE TO SAFETY PRECAUTIONS W/ASSISTANCE/DEVICE Description: STG Adhere to Safety Precautions With Assistance/Device. Outcome: Not Progressing Flowsheets (Taken 04/28/2023 1748) STG:Pt will adhere to safety precautions with assistance/device: Other (Comment) Note: Tele-sitter/Fall mats   Problem: RH COGNITION-NURSING Goal: RH STG USES MEMORY AIDS/STRATEGIES W/ASSIST TO PROBLEM SOLVE Description: STG Uses Memory Aids/Strategies With  MOD I Assistance to Problem Solve. Outcome: Progressing Flowsheets (Taken 04/28/2023 1748) STG: Uses memory aids/strategies with assistance: 5-Supervision/cueing   Problem: RH PAIN MANAGEMENT Goal: RH STG PAIN MANAGED AT OR BELOW PT'S PAIN GOAL Description: <4 w/ prns Outcome: Progressing

## 2023-04-28 NOTE — Progress Notes (Signed)
 Speech Language Pathology Daily Session Note  Patient Details  Name: Brittany [name] MRN: [id number] Date of Birth: Oct 03, 1965  Today's Date: 04/28/2023 SLP Individual Time: 0900-0959 SLP Individual Time Calculation (min): 59 min  Short Term Goals: Week 1: SLP Short Term Goal 1 (Week 1): Patient will answer basic environmental and biographical yes/no questions with 90% accuracy given max assist. SLP Short Term Goal 2 (Week 1): Patient will follow 1-step directions with 90% accuracy given max assist. SLP Short Term Goal 3 (Week 1): Patient will verbalize trained word-level functional utterances in 8/10 opportunities given max assist. SLP Short Term Goal 4 (Week 1): Patient will demonstrate use of recommended swallow safety strategies by clearing residue from the right buccal cavity during solid consumption in 4/5 opportunities given max verbal cues.  Skilled Therapeutic Interventions: Skilled therapy session focused on communication goals. SLP faciliated session through completion of orientation task. Given a FO4, patient was oriented to self, location and year, however demonstrated difficulty with orientation to time. SLP provided calendar in which patient independently pointed to correct day. SLP targeted expressive language through automatic speech tasks. Patient counted 1-10 given maxA with consistent motor speech errors and approximations. Patient was unable to recall own name, however approximated sons name with maxA. SLP targeted expressive language through simple yes/no questions. Patient with 90% accuracy given minA. Patient gestured to get in bed. Patient left in bed with alarm set and call bell in reach. Continue POC.   Pain Denies  Therapy/Group: Individual Therapy  Yacob Wilkerson M.A., CF-SLP 04/28/2023, 7:39 AM

## 2023-04-28 NOTE — Progress Notes (Signed)
 PROGRESS NOTE   Subjective/Complaints:  Pt doing well, working with SLP. Slept well, pain controlled, unsure of LBM but looks like it was on 04/26/23. Urinating ok. Denies any other complaints or concerns today. RN reporting white patches on tongue.   ROS: limited due to aphasia. Denies CP, SOB, n/v.  Objective:   DG Abd 2 Views Result Date: 04/27/2023 CLINICAL DATA:  Severe abdominal pain EXAM: ABDOMEN - 2 VIEW COMPARISON:  04/20/2023 FINDINGS: There is normal bowel gas pattern. No free air. No organomegaly or suspicious calcification. No acute bony abnormality. IMPRESSION: Negative. Electronically Signed   By: Franky Crease M.D.   On: 04/27/2023 19:24   Recent Labs    04/26/23 0532  WBC 4.7  HGB 11.5*  HCT 34.7*  PLT 155   Recent Labs    04/25/23 1150 04/26/23 0532  NA 137 139  K 3.7 3.7  CL 109 110  CO2 19* 19*  GLUCOSE 94 85  BUN 16 13  CREATININE 1.09* 0.92  CALCIUM  8.5* 8.0*         Intake/Output Summary (Last 24 hours) at 04/28/2023 1135 Last data filed at 04/28/2023 0800 Gross per 24 hour  Intake 600 ml  Output 300 ml  Net 300 ml        Physical Exam: Vital Signs Blood pressure 138/66, pulse 63, temperature 98.1 F (36.7 C), resp. rate 16, height 5' 2 (1.575 m), weight 68.9 kg, SpO2 100%.  Constitutional: sitting up in w/c, Alert and wake, NAD HENT:   Atraumatic, normocephalic. Eyes: PERRLA. EOM seem intact. No notable nystagmus.  Cardiovascular: RRR, no murmurs/rub/gallops. No Edema. Peripheral pulses 2+  Respiratory: CTAB. No rales, rhonchi, or wheezing. On RA.  Abdomen: soft, + bowel sounds, nonTTP, nondistended Skin: C/D/I. No apparent lesions. Extremities: R groin with large hematoma in the inguinal canal area, mildly tender, buising to area noted unchanged from prior days-- not reassessed this morning  PRIOR EXAMS: Neuro: Alert awake, answering yes/no questions and gesturing for  communication, following simple commands Continued aphasia appears more expressive, dysarthric voice, mild right facial weakness Moving all 4 extremities to gravity and resistance   Assessment/Plan: 1. Functional deficits which require 3+ hours per day of interdisciplinary therapy in a comprehensive inpatient rehab setting. Physiatrist is providing close team supervision and 24 hour management of active medical problems listed below. Physiatrist and rehab team continue to assess barriers to discharge/monitor patient progress toward functional and medical goals  Care Tool:  Bathing    Body parts bathed by patient: Right arm, Left arm, Chest, Abdomen, Front perineal area, Buttocks, Right upper leg, Left upper leg, Right lower leg, Left lower leg, Face         Bathing assist Assist Level: Contact Guard/Touching assist     Upper Body Dressing/Undressing Upper body dressing   What is the patient wearing?: Hospital gown only    Upper body assist Assist Level: Minimal Assistance - Patient > 75%    Lower Body Dressing/Undressing Lower body dressing      What is the patient wearing?: Underwear/pull up, Pants     Lower body assist Assist for lower body dressing: Minimal Assistance - Patient > 75%  Toileting Toileting    Toileting assist Assist for toileting: Contact Guard/Touching assist     Transfers Chair/bed transfer  Transfers assist     Chair/bed transfer assist level: Contact Guard/Touching assist     Locomotion Ambulation   Ambulation assist      Assist level: Minimal Assistance - Patient > 75% Assistive device: Hand held assist Max distance: 31ft   Walk 10 feet activity   Assist     Assist level: Minimal Assistance - Patient > 75% Assistive device: Other (comment) (R HHA)   Walk 50 feet activity   Assist    Assist level: Minimal Assistance - Patient > 75% Assistive device: Other (comment) (R HHA)    Walk 150 feet activity   Assist  Walk 150 feet activity did not occur: Safety/medical concerns         Walk 10 feet on uneven surface  activity   Assist Walk 10 feet on uneven surfaces activity did not occur: Safety/medical concerns         Wheelchair     Assist Is the patient using a wheelchair?: Yes Type of Wheelchair: Manual    Wheelchair assist level: Dependent - Patient 0% Max wheelchair distance: 250    Wheelchair 50 feet with 2 turns activity    Assist        Assist Level: Dependent - Patient 0%   Wheelchair 150 feet activity     Assist      Assist Level: Dependent - Patient 0%   Blood pressure 138/66, pulse 63, temperature 98.1 F (36.7 C), resp. rate 16, height 5' 2 (1.575 m), weight 68.9 kg, SpO2 100%.  Medical Problem List and Plan: 1. 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  R-CFV DVT (found 1/31)/Antithrombotics: -DVT/anticoagulation:  Pharmaceutical: Heparin  low dose X 48 hours and if stable transition to DOAC              -antiplatelet therapy: Brillinta. ASA d/c due to addition of Heparin .  - Per lit review, early mobilization current recommendation, without gross restrictions; no convincing evidence of increased PE risk.   -04/22/23 long discussion with pt and brother regarding clot location, this coincides with area of pain, large palpable ?hematoma to R groin; had mechanical thrombectomy-- called IR Dr. Juliene Balder who relayed to the team, Delon Beagle NP will come see the patient, appreciate their assistance with this case. For now, added Tramadol   100mg  q8h PRN to help with pain.  -04/23/23 Vas U/S with hematoma/continued DVT-smaller, Pt seen by IR this AM - recommend avoiding bending or stooping, limited mobility- transfers only for 1-2 days.  Ok to transition to eliquis   -2/6 Dr. Dolphus recommends 1 more day of light activity than full activity Friday -2/7 OK for full activity today, neurology adjusting Brilinta  dose to 45 mg BID given results of P2y12, she is also on Eliquis  5 mg twice daily  3. Pain Management:  Tylenol  prn. Gabapentin  300mg  TID -04/22/23 added Tramadol  100mg  q8h PRN for pain in R groin (see above)-- pt allergic/intolerant to other hydro/oxycodone .  -2/4 scheduled Tylenol  -2/6 appears comfortable today, continue current regimen 4. Mood/Behavior/Sleep: LCSW to follow for evaluation and support.              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 Neuropsych/cognition: This patient is not capable of making decisions on her own behalf. 6. Skin/Wound Care: Routine pressure relief measures.  7. Fluids/Electrolytes/Nutrition: Monitor I/O. Check routine labs -04/21/23 Cr 1.19 a little up from yesterday but down from prior, monitor; K 3.3, see below; remainder of CMP unremarkable, monitor labs 8. HTN: Monitor BP TID--continue amlodipine  10mg  daily and hydralazine  50mg  q8h             --avoid hypoperfusion.   -2/4 intermittently elevated, continue to monitor trend for now.  Avoid hypotension  2/7 BP has been elevated, will start low-dose metoprolol  12.5 mg twice daily  -04/28/23 BPs better, monitor Vitals:   04/27/23 0613 04/27/23 0616 04/27/23 0732 04/27/23 0800  BP: (!) 151/73 (!) 151/73 (!) 148/81 (!)  162/77   04/27/23 0939 04/27/23 1325 04/27/23 1458 04/27/23 1921  BP: (!) 141/82 136/64 (!) 177/78 (!) 143/82   04/27/23 2034 04/28/23 0559 04/28/23 0559 04/28/23 0858  BP: (!) 143/82 (!) 128/59 (!) 124/59 138/66    9. RLQ pain:  Felt to be due to IBS. Has been on miralax  daily and Senna S 2 tabs nightly without results --Linzess  72mcg every day was started today. Will get KUB  -04/21/23 KUB with gas filled colon but otherwise neg/nonobstructive; reviewed images myself, suspect stool burden contributory to gas trapped in R side; will increase miralax /colace to BID, leave senna S as is, add sorbitol  30ml PRN if no BM today; has PRN dulcolax supp and fleet if needed too; monitor closely -04/22/23 pt insists she's had BMs daily, but no documentation; no abd pain today, appears it's R groin pain-- see above #2-- tramadol  added, leave stool softeners in place, NEED DOCUMENTATION OF STOOLS CT abdomen on 2/4 neg for acute changes, mod stool -2/6 LBM today having more regular bowel movements -2/7-nursing reports patient's pain worsened after her Linzess , family indicates she may have been having pain with this medication previously.  Discussed with GI.  Will discontinue Linzess .  Will check x-ray for stool burden. -04/28/23 xray yesterday unremarkable. No c/o abd pain today. LBM 2 days ago but liquidy; Monitor.    10. Hypokalemia: Supplement added X 2 days.  -04/21/23 K 3.3, potassium supplement not ordered, 40mEq daily x2 days added today -2/6 3.7 today 11. Hyperlipidemia: LDL-124 on Lipitor  80 mg and Zetia  10mg  daily.  12. Pre-diabetes: Hgb A1c- 5.9 and every 4 hours CBS have shown BS controlled. --Will monitor fasting BS on serial labs.  13. Substance abuse: Educated on stroke risk w/marijuana.  14. H/o MM: Acute renal failure improving SCr 1.11 @ admission-->1.59-->1.07  -04/21/23 Cr 1.19, monitor  -2/3 Cr improved to 0.97  Recheck labs, start IVF NS 71ml/hr due to poor intake   2/6 creatinine down to  0.92, DC IVF and encourage oral fluids 15. GERD: On protonix  40mg  daily 16. Chest pain episode  2/6  -EKG NSR, Trop did not trend up. Pain resolved-suspect this may have been more abdominal pain  17. Oral candidiasis: noted by RN, nystatin  QID x7d ordered     LOS: 8 days A FACE TO FACE EVALUATION WAS PERFORMED  [address] 04/28/2023, 11:35 AM

## 2023-04-29 DIAGNOSIS — I10 Essential (primary) hypertension: Secondary | ICD-10-CM | POA: Diagnosis not present

## 2023-04-29 DIAGNOSIS — I63512 Cerebral infarction due to unspecified occlusion or stenosis of left middle cerebral artery: Secondary | ICD-10-CM | POA: Diagnosis not present

## 2023-04-29 DIAGNOSIS — K5901 Slow transit constipation: Secondary | ICD-10-CM | POA: Diagnosis not present

## 2023-04-29 DIAGNOSIS — B370 Candidal stomatitis: Secondary | ICD-10-CM | POA: Diagnosis not present

## 2023-04-29 MED ORDER — TRAMADOL HCL 50 MG PO TABS
100.0000 mg | ORAL_TABLET | Freq: Three times a day (TID) | ORAL | Status: DC | PRN
  Administered 2023-04-29 – 2023-05-02 (×8): 100 mg via ORAL
  Filled 2023-04-29 (×8): qty 2

## 2023-04-29 NOTE — Progress Notes (Addendum)
 Physical Therapy Weekly Progress Note  Patient Details  Name: Brittany [name] MRN: [id number] Date of Birth: 24-Feb-1966  Beginning of progress report period: April 21, 2023 End of progress report period: April 29, 2023  Today's Date: 04/29/2023 PT Individual Time: 1045-1140, 1450-1515 PT Individual Time Calculation (min): 55 min, 25 min  and Today's Date: 04/29/2023 PT Missed Time: 20 Minutes Missed Time Reason: Patient fatigue;Pain;Patient unwilling to participate  Patient has met 3 of 5 short term goals. Pt is making great progress towards goals. Pt is performing bed mobility independently, sit to stand and stand pivot transfer with supervision with and without AD, gait x150+ feet with no AD and CGA, and with rollator and supervision. Car transfer with supervision. D/C planned for 2/13. Will schedule family training closer to D/C. Downgraded goals to supervision for safety 2/2 cognitive deficits.   Patient continues to demonstrate the following deficits muscle weakness and muscle joint tightness, decreased cardiorespiratoy endurance, R hemi, decreased problem solving and decreased safety awareness, and hemiplegia and therefore will continue to benefit from skilled PT intervention to increase functional independence with mobility.  Patient progressing toward long term goals..  Continue plan of care.  PT Short Term Goals Week 1:  PT Short Term Goal 1 (Week 1): Pt will perform bed mobility from flat bed surface and no rails with Mod I. PT Short Term Goal 1 - Progress (Week 1): Met PT Short Term Goal 2 (Week 1): Pt will perform functional transfers with no AD and supervision. PT Short Term Goal 2 - Progress (Week 1): Met PT Short Term Goal 3 (Week 1): Pt will ambulate short, household distances with no AD and close supervision. PT Short Term Goal 3 - Progress (Week 1): Met PT Short Term Goal 4 (Week 1): Pt will ambulate community distances using rollator with supervision. PT Short  Term Goal 4 - Progress (Week 1): Progressing toward goal PT Short Term Goal 5 (Week 1): Pt wil complete outcome measure. PT Short Term Goal 5 - Progress (Week 1): Progressing toward goal Week 2:  PT Short Term Goal 1 (Week 2): STG=LTG 2/2 ELOS  Skilled Therapeutic Interventions/Progress Updates:      Treatment Session 1  Pt supine in bed upon arrival with blanket over pt head. Pt eventually agreeable to therapy with encouragement from therapist. Pt reports pain in multiple areas throughout session however inconsistent, unsure if due to behavior, or aphasia or combination.   Pain: pt initially reports abdominal pain, notified nursing, nursing present to administer medication. Prior to nurse arrival, pt then denies abdominal pain. Pt grasping at R groin region, upon further inspection by PT and nurse, pain with palpation on bikini line fold, evidence of MASD, donned powder and barrier cream. Pt reports R knee pain with 2nd ambulation trial. Pt reports quad pain upon return to room. Notified nursing. Therapist provided rest breaks and repositioning throughout session.   Bed mobility: independent  Transfers: sit to stand, stand pivot transfer throughout session with rollator and supervision, and without an AD and CGA progressing to supervision.of note: pt initially stood and sat on WC and grabbed blanket and wrapped it around her--pt agreeable to therex   Therex: active assisted on R LE for full ROM within available range  1x10 B LE LAQ  1x10 B LE marching 1x10 B LE ankle pumps   Pt eventually agreeable to Gait training: 1x172 feet, 1x~50 feet with no AD and CGA, verbal cues provided for R LE heel toe, and hip/knee flexion  for improved clearance.   Pt requesting to sit in [facility] at end of session. Pt seated in [facility] with all needs within reach and seatbelt alarm on. Nurse notified of pt positioning.   Treatment Session 2   Therapist present to make up missed minutes. Pt asleep upon arrival. Pt  awoken and agreeable to therapy. Pt denies any pain.   Supine to sit independently. Pt wears blanket over shoulders throughout session, therapist managing it to prevent pt from tripping. Pt refusing to take it off to practice stairs, therapist opted not to attempt stairs with blanket for safety.   Treatment session focused on gait training with rollator.   Pt ambulated room, around day room, to main gym, to ortho gym, to room with rollator and close supervision, pt required multiple seated rest breaks for fatigue/SOB, max verbal and tactile cues provided for technique for seated rest break on rollator with emphasis on parking it against wall and locking brakes. Verbal cues provided for breathing in through nose and out through mouth, with emphasis on reducing mouth breathing.   Pt fatigued and requesting to return to bed. Sit to supine independently. Pt supine in bed with bed alarm on and needs within reach.    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

## 2023-04-29 NOTE — Progress Notes (Signed)
 PROGRESS NOTE   Subjective/Complaints:  Pt doing well again today, pain manageable (just got meds), unsure of LBM but looks like it was on 04/26/23-- pt can't really tell me if she had one since. Urinating ok. Denies any other complaints or concerns today.   ROS: limited due to aphasia. Denies CP, SOB, n/v.  Objective:   DG Abd 2 Views Result Date: 04/27/2023 CLINICAL DATA:  Severe abdominal pain EXAM: ABDOMEN - 2 VIEW COMPARISON:  04/20/2023 FINDINGS: There is normal bowel gas pattern. No free air. No organomegaly or suspicious calcification. No acute bony abnormality. IMPRESSION: Negative. Electronically Signed   By: Franky Crease M.D.   On: 04/27/2023 19:24   No results for input(s): WBC, HGB, HCT, PLT in the last 72 hours.  No results for input(s): NA, K, CL, CO2, GLUCOSE, BUN, CREATININE, CALCIUM  in the last 72 hours.        Intake/Output Summary (Last 24 hours) at 04/29/2023 1100 Last data filed at 04/29/2023 0800 Gross per 24 hour  Intake 960 ml  Output --  Net 960 ml        Physical Exam: Vital Signs Blood pressure (!) 162/82, pulse 65, temperature 98.5 F (36.9 C), resp. rate 18, height 5' 2 (1.575 m), weight 68.9 kg, SpO2 100%.  Constitutional: sitting up in bedside chair then got into bed, Alert and wake, NAD HENT:   Atraumatic, normocephalic. Eyes: PERRLA. EOM seem intact. No notable nystagmus.  Cardiovascular: RRR, no murmurs/rub/gallops. No Edema. Peripheral pulses 2+  Respiratory: CTAB. No rales, rhonchi, or wheezing. On RA.  Abdomen: soft, + bowel sounds, nonTTP, nondistended Skin: C/D/I. No apparent lesions. MsK: able to get up and walk to bed unassisted Extremities: R groin with large hematoma in the inguinal canal area, mildly tender, buising to area noted unchanged from prior days-- not reassessed this morning  PRIOR EXAMS: Neuro: Alert awake, answering yes/no questions and  gesturing for communication, following simple commands Continued aphasia appears more expressive, dysarthric voice, mild right facial weakness Moving all 4 extremities to gravity and resistance   Assessment/Plan: 1. Functional deficits which require 3+ hours per day of interdisciplinary therapy in a comprehensive inpatient rehab setting. Physiatrist is providing close team supervision and 24 hour management of active medical problems listed below. Physiatrist and rehab team continue to assess barriers to discharge/monitor patient progress toward functional and medical goals  Care Tool:  Bathing    Body parts bathed by patient: Right arm, Left arm, Chest, Abdomen, Front perineal area, Buttocks, Right upper leg, Left upper leg, Right lower leg, Left lower leg, Face         Bathing assist Assist Level: Contact Guard/Touching assist     Upper Body Dressing/Undressing Upper body dressing   What is the patient wearing?: Hospital gown only    Upper body assist Assist Level: Minimal Assistance - Patient > 75%    Lower Body Dressing/Undressing Lower body dressing      What is the patient wearing?: Underwear/pull up, Pants     Lower body assist Assist for lower body dressing: Minimal Assistance - Patient > 75%     Toileting Toileting    Toileting assist Assist for toileting:  Contact Guard/Touching assist     Transfers Chair/bed transfer  Transfers assist     Chair/bed transfer assist level: Contact Guard/Touching assist     Locomotion Ambulation   Ambulation assist      Assist level: Minimal Assistance - Patient > 75% Assistive device: Hand held assist Max distance: 51ft   Walk 10 feet activity   Assist     Assist level: Minimal Assistance - Patient > 75% Assistive device: Other (comment) (R HHA)   Walk 50 feet activity   Assist    Assist level: Minimal Assistance - Patient > 75% Assistive device: Other (comment) (R HHA)    Walk 150 feet  activity   Assist Walk 150 feet activity did not occur: Safety/medical concerns         Walk 10 feet on uneven surface  activity   Assist Walk 10 feet on uneven surfaces activity did not occur: Safety/medical concerns         Wheelchair     Assist Is the patient using a wheelchair?: Yes Type of Wheelchair: Manual    Wheelchair assist level: Dependent - Patient 0% Max wheelchair distance: 250    Wheelchair 50 feet with 2 turns activity    Assist        Assist Level: Dependent - Patient 0%   Wheelchair 150 feet activity     Assist      Assist Level: Dependent - Patient 0%   Blood pressure (!) 162/82, pulse 65, temperature 98.5 F (36.9 C), resp. rate 18, height 5' 2 (1.575 m), weight 68.9 kg, SpO2 100%.  Medical Problem List and Plan: 1. 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  R-CFV DVT (found 1/31)/Antithrombotics: -DVT/anticoagulation:  Pharmaceutical: Heparin  low dose X 48 hours and if stable transition to DOAC              -antiplatelet therapy: Brillinta. ASA d/c due to addition of Heparin .  - Per lit review, early mobilization current recommendation, without gross restrictions; no convincing evidence of increased PE risk.   -04/22/23 long discussion with pt and brother regarding clot location, this coincides with area of pain, large palpable ?hematoma to R groin; had mechanical thrombectomy-- called IR Dr. Juliene Balder who relayed to the team, Delon Beagle NP will come see the patient, appreciate their assistance with this case. For  now, added Tramadol  100mg  q8h PRN to help with pain.  -04/23/23 Vas U/S with hematoma/continued DVT-smaller, Pt seen by IR this AM - recommend avoiding bending or stooping, limited mobility- transfers only for 1-2 days.  Ok to transition to eliquis   -2/6 Dr. 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 Pain Management:  Tylenol  prn. Gabapentin  300mg   TID -04/22/23 added Tramadol  100mg  q8h PRN for pain in R groin (see above)-- pt allergic/intolerant to other hydro/oxycodone .  -2/4 scheduled Tylenol  -2/6 appears comfortable today, continue current regimen 4. Mood/Behavior/Sleep: LCSW to follow for evaluation and support.              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 Neuropsych/cognition: This patient is not capable of making decisions on her own behalf. 6. Skin/Wound Care: Routine pressure relief measures.  7. Fluids/Electrolytes/Nutrition: Monitor I/O. Check routine labs -04/21/23 Cr 1.19 a little up from yesterday but down from prior, monitor; K 3.3, see below; remainder of CMP unremarkable, monitor labs 8. HTN: Monitor BP TID--continue amlodipine  10mg  daily and hydralazine  50mg  q8h             --avoid hypoperfusion.   -2/4 intermittently elevated, continue to monitor trend for now.  Avoid hypotension  2/7 BP has been elevated, will start low-dose metoprolol  12.5 mg twice daily  -2/8-9/25 BPs better overall, monitor Vitals:   04/27/23 1325 04/27/23 1458 04/27/23 1921 04/27/23 2034  BP: 136/64 (!)  177/78 (!) 143/82 (!) 143/82   04/28/23 0559 04/28/23 0559 04/28/23 0858 04/28/23 1434  BP: (!) 128/59 (!) 124/59 138/66 (!) 150/66   04/28/23 1435 04/28/23 1444 04/28/23 1917 04/29/23 0541  BP: (!) 150/66 134/78 123/64 (!) 162/82    9. RLQ pain:  Felt to be due to IBS. Has been on miralax  daily and Senna S 2 tabs nightly without results --Linzess  72mcg every day was started today. Will get KUB  -04/21/23 KUB with gas filled colon but otherwise neg/nonobstructive; reviewed images myself, suspect stool burden contributory to gas trapped in R side; will increase miralax /colace to BID, leave senna S as is, add sorbitol  30ml PRN if no BM today; has PRN dulcolax supp and fleet if needed too; monitor closely -04/22/23 pt insists she's had BMs daily, but no documentation; no abd pain today, appears it's R groin pain-- see above #2-- tramadol  added, leave stool softeners in place, NEED DOCUMENTATION OF STOOLS CT abdomen on 2/4 neg for acute changes, mod stool -2/6 LBM today having more regular bowel movements -2/7-nursing reports patient's pain worsened after her Linzess , family indicates she may have been having pain with this medication previously.  Discussed with GI.  Will discontinue Linzess .  Will check x-ray for stool burden. -04/28/23 xray yesterday unremarkable. No c/o abd pain today. LBM 2 days ago but liquidy; Monitor.  -04/29/23 no abd discomfort today, LBM 2/6 liquidy, monitor for BM overnight, if none then may want to address it further.    10. Hypokalemia: Supplement added X 2 days.  -04/21/23 K 3.3, potassium supplement not ordered, 40mEq daily x2 days added today -2/6 3.7 today 11. Hyperlipidemia: LDL-124 on Lipitor  80 mg and Zetia  10mg  daily.  12. Pre-diabetes: Hgb A1c- 5.9 and every 4 hours CBS have shown BS controlled. --Will monitor fasting BS on serial labs.  13. Substance abuse: Educated on stroke risk w/marijuana.  14. H/o MM: Acute renal failure improving SCr 1.11 @  admission-->1.59-->1.07  -04/21/23 Cr 1.19, monitor  -2/3 Cr improved to 0.97  Recheck labs, start IVF NS 75ml/hr due to poor intake   2/6 creatinine down to 0.92, DC IVF and  encourage oral fluids 15. GERD: On protonix  40mg  daily 16. Chest pain episode 2/6  -EKG NSR, Trop did not trend up. Pain resolved-suspect this may have been more abdominal pain  17. Oral candidiasis: noted by RN, nystatin  QID x7d ordered     LOS: 9 days A FACE TO FACE EVALUATION WAS PERFORMED  [address] 04/29/2023, 11:00 AM

## 2023-04-30 DIAGNOSIS — I63512 Cerebral infarction due to unspecified occlusion or stenosis of left middle cerebral artery: Secondary | ICD-10-CM | POA: Diagnosis not present

## 2023-04-30 DIAGNOSIS — I10 Essential (primary) hypertension: Secondary | ICD-10-CM | POA: Diagnosis not present

## 2023-04-30 DIAGNOSIS — R1031 Right lower quadrant pain: Secondary | ICD-10-CM | POA: Diagnosis not present

## 2023-04-30 DIAGNOSIS — D649 Anemia, unspecified: Secondary | ICD-10-CM

## 2023-04-30 DIAGNOSIS — I6930 Unspecified sequelae of cerebral infarction: Secondary | ICD-10-CM | POA: Diagnosis not present

## 2023-04-30 DIAGNOSIS — N179 Acute kidney failure, unspecified: Secondary | ICD-10-CM | POA: Diagnosis not present

## 2023-04-30 DIAGNOSIS — G609 Hereditary and idiopathic neuropathy, unspecified: Secondary | ICD-10-CM | POA: Diagnosis not present

## 2023-04-30 DIAGNOSIS — K589 Irritable bowel syndrome without diarrhea: Secondary | ICD-10-CM | POA: Diagnosis not present

## 2023-04-30 DIAGNOSIS — I82411 Acute embolism and thrombosis of right femoral vein: Secondary | ICD-10-CM | POA: Diagnosis not present

## 2023-04-30 DIAGNOSIS — E876 Hypokalemia: Secondary | ICD-10-CM | POA: Diagnosis not present

## 2023-04-30 LAB — CBC
HCT: 34.1 % — ABNORMAL LOW (ref 36.0–46.0)
Hemoglobin: 11.2 g/dL — ABNORMAL LOW (ref 12.0–15.0)
MCH: 28.9 pg (ref 26.0–34.0)
MCHC: 32.8 g/dL (ref 30.0–36.0)
MCV: 87.9 fL (ref 80.0–100.0)
Platelets: 209 K/uL (ref 150–400)
RBC: 3.88 MIL/uL (ref 3.87–5.11)
RDW: 14.5 % (ref 11.5–15.5)
WBC: 5.1 K/uL (ref 4.0–10.5)
nRBC: 0 % (ref 0.0–0.2)

## 2023-04-30 LAB — BASIC METABOLIC PANEL
Anion gap: 15 (ref 5–15)
BUN: 7 mg/dL (ref 6–20)
CO2: 23 mmol/L (ref 22–32)
Calcium: 9.4 mg/dL (ref 8.9–10.3)
Chloride: 104 mmol/L (ref 98–111)
Creatinine, Ser: 0.85 mg/dL (ref 0.44–1.00)
GFR, Estimated: >60 mL/min (ref >60)
Glucose, Bld: 93 mg/dL (ref 70–99)
Potassium: 3.5 mmol/L (ref 3.5–5.1)
Sodium: 142 mmol/L (ref 135–145)

## 2023-04-30 MED ORDER — SORBITOL 70 % SOLN: 30.0000 mL | Freq: Once | Status: DC

## 2023-04-30 MED ORDER — POTASSIUM CHLORIDE 20 MEQ PO PACK
20.0000 meq | PACK | Freq: Once | ORAL | Status: AC
  Administered 2023-04-30: 20 meq via ORAL
  Filled 2023-04-30: qty 1

## 2023-04-30 MED ORDER — SORBITOL 70 % SOLN
45.0000 mL | Freq: Once | Status: DC
  Filled 2023-04-30: qty 60

## 2023-04-30 NOTE — Plan of Care (Signed)
 Downgraded all goals to supervision 2/2 cognitive deficits  Problem: RH Balance Goal: LTG Patient will maintain dynamic standing balance (PT) Description: LTG:  Patient will maintain dynamic standing balance with assistance during mobility activities (PT) Flowsheets (Taken 04/30/2023 1017) LTG: Pt will maintain dynamic standing balance during mobility activities with:: Supervision/Verbal cueing   Problem: Sit to Stand Goal: LTG:  Patient will perform sit to stand with assistance level (PT) Description: LTG:  Patient will perform sit to stand with assistance level (PT) Flowsheets (Taken 04/30/2023 1017) LTG: PT will perform sit to stand in preparation for functional mobility with assistance level: Supervision/Verbal cueing   Problem: RH Bed to Chair Transfers Goal: LTG Patient will perform bed/chair transfers w/assist (PT) Description: LTG: Patient will perform bed to chair transfers with assistance (PT). Flowsheets (Taken 04/30/2023 1017) LTG: Pt will perform Bed to Chair Transfers with assistance level: Supervision/Verbal cueing   Problem: RH Furniture Transfers Goal: LTG Patient will perform furniture transfers w/assist (OT/PT) Description: LTG: Patient will perform furniture transfers  with assistance (OT/PT). Flowsheets (Taken 04/30/2023 1017) LTG: Pt will perform furniture transfers with assist:: Supervision/Verbal cueing

## 2023-04-30 NOTE — Progress Notes (Addendum)
 Physical Therapy Session Note  Patient Details  Name: Brittany [name] MRN: [id number] Date of Birth: 04-25-1965  Today's Date: 04/30/2023 PT Individual Time: 0800-0857 PT Individual Time Calculation (min): 57 min  and Today's Date: 04/30/2023 PT Missed Time: 18 Minutes Missed Time Reason: Patient fatigue;Patient unwilling to participate  Short Term Goals: Week 2:  PT Short Term Goal 1 (Week 2): STG=LTG 2/2 ELOS  Skilled Therapeutic Interventions/Progress Updates:      Pt supine in bed upon arrival. Pt agreeable to therapy. Pt reports R LE pain, not quantified, requesting pain medicine, notified nursing. Nurse present to administer medications.   Treatment session focused on endurance, gait training, outcome measure assessment.    6 minute walk test 6 minute walk test, pt ambulated 150 feet prior to requiring seated rest break on rollator after 1 min and 53 seconds 2/2 fatigue and pain in R LE.   Five times Sit to Stand Test (FTSS) Method: Use a straight back chair with a solid seat that is 16-18" high. Ask participant to sit on the chair with arms folded across their chest.   Instructions: "Stand up and sit down as quickly as possible 5 times, keeping your arms folded across your chest."   Measurement: Stop timing when the participant stands the 5th time.  TIME: __14.73____ (in seconds)  Times > 13.6 seconds is associated with increased disability and morbidity (Guralnik, 2000) Times > 15 seconds is predictive of recurrent falls in healthy individuals aged 58 and older (Buatois, et al., 2008) Normal performance values in community dwelling individuals aged 58 and older (Bohannon, 2006): 60-69 years: 11.4 seconds 70-79 years: 12.6 seconds 80-89 years: 14.8 seconds  MCID: >= 2.3 seconds for Vestibular Disorders Wray Kearns, 2006)  Patient demonstrates increased fall risk as noted by score of   51/56 on Berg Balance Scale.  (<36= high risk for falls, close to 100%; 37-45  significant >80%; 46-51 moderate >50%; 52-55 lower >25%)   Functional Gait Assessment  Patient demonstrates increased fall risk as noted by score of 16/30 on  Functional Gait Assessment.   <22/30 = predictive of falls, <20/30 = fall in 6 months, <18/30 = predictive of falls in PD MCID: 5 points stroke population, 4 points geriatric population (ANPTA Core Set of Outcome Measures for Adults with Neurologic Conditions, 2018)  Pt requesting to return to room 2/2 fatigue, despite encouragement to continue therapy until end of session at 915. Pt supine in bed at end of sesion with all neeeds within reach and bed alarm on. Pt missed 18 min 2/2 fatigue.      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), RLE DVT (no limitations according to H&P) Restrictions Weight Bearing Restrictions Per Provider Order: No Balance: Balance Balance Assessed: Yes Standardized Balance Assessment Standardized Balance Assessment: Berg Balance Test;Functional Gait Assessment Berg Balance Test Sit to Stand: Able to stand without using hands and stabilize independently Standing Unsupported: Able to stand safely 2 minutes Sitting with Back Unsupported but Feet Supported on Floor or Stool: Able to sit safely and securely 2 minutes Stand to Sit: Sits safely with minimal use of hands Transfers: Able to transfer safely, minor use of hands Standing Unsupported with Eyes Closed: Able to stand 10 seconds safely Standing Ubsupported with Feet Together: Able to place feet together independently and stand 1 minute safely From Standing, Reach Forward with Outstretched Arm: Can reach forward >12 cm safely (5") From Standing Position, Pick up Object from  Floor: Able to pick up shoe safely and easily From Standing Position, Turn to Look Behind Over each Shoulder: Looks behind from both sides and weight shifts  well Turn 360 Degrees: Able to turn 360 degrees safely in 4 seconds or less Standing Unsupported, Alternately Place Feet on Step/Stool: Able to stand independently and complete 8 steps >20 seconds Standing Unsupported, One Foot in Front: Able to plae foot ahead of the other independently and hold 30 seconds Standing on One Leg: Able to lift leg independently and hold equal to or more than 3 seconds Total Score: 51 Functional Gait  Assessment Gait Level Surface: Walks 20 ft in less than 7 sec but greater than 5.5 sec, uses assistive device, slower speed, mild gait deviations, or deviates 6-10 in outside of the 12 in walkway width. Change in Gait Speed: Able to change speed, demonstrates mild gait deviations, deviates 6-10 in outside of the 12 in walkway width, or no gait deviations, unable to achieve a major change in velocity, or uses a change in velocity, or uses an assistive device. Gait with Horizontal Head Turns: Performs head turns smoothly with slight change in gait velocity (eg, minor disruption to smooth gait path), deviates 6-10 in outside 12 in walkway width, or uses an assistive device. Gait with Vertical Head Turns: Performs task with slight change in gait velocity (eg, minor disruption to smooth gait path), deviates 6 - 10 in outside 12 in walkway width or uses assistive device Gait and Pivot Turn: Pivot turns safely in greater than 3 sec and stops with no loss of balance, or pivot turns safely within 3 sec and stops with mild imbalance, requires small steps to catch balance. Step Over Obstacle: Is able to step over one shoe box (4.5 in total height) without changing gait speed. No evidence of imbalance. Gait with Narrow Base of Support: Ambulates less than 4 steps heel to toe or cannot perform without assistance. Gait with Eyes Closed: Walks 20 ft, slow speed, abnormal gait pattern, evidence for imbalance, deviates 10-15 in outside 12 in walkway width. Requires more than 9 sec to ambulate  20 ft. Ambulating Backwards: Walks 20 ft, uses assistive device, slower speed, mild gait deviations, deviates 6-10 in outside 12 in walkway width. Steps: Two feet to a stair, must use rail. Total Score: 16  Therapy/Group: Individual Therapy  [facility] [place]rose [place], Gleason, DPT  04/30/2023, 8:31 AM

## 2023-04-30 NOTE — Progress Notes (Addendum)
 PROGRESS NOTE   Subjective/Complaints:  Patient working in the gym today with therapy.  She continues to have some inguinal pain but no significant abdominal pain today.  No additional concerns or complaints noted today.  No acute events overnight.  ROS: limited due to aphasia. Denies CP, SOB, n/v.  Objective:   No results found.  Recent Labs    04/30/23 0537  WBC 5.1  HGB 11.2*  HCT 34.1*  PLT 209    Recent Labs    04/30/23 0537  NA 142  K 3.5  CL 104  CO2 23  GLUCOSE 93  BUN 7  CREATININE 0.85  CALCIUM  9.4          Intake/Output Summary (Last 24 hours) at 04/30/2023 1241 Last data filed at 04/30/2023 1200 Gross per 24 hour  Intake 720 ml  Output 2 ml  Net 718 ml        Physical Exam: Vital Signs Blood pressure (!) 140/61, pulse 76, temperature (!) 97.5 F (36.4 C), temperature source Oral, resp. rate 17, height 5\' 2"  (1.575 m), weight 68.9 kg, SpO2 100%.  Constitutional: NAD HENT:   Atraumatic, normocephalic. Eyes: PERRLA. EOM seem intact. No notable nystagmus.  Cardiovascular: RRR, no murmurs/rub/gallops. No Edema. Peripheral pulses 2+  Respiratory: CTAB. No rales, rhonchi, or wheezing. On RA.  Abdomen: soft, + bowel sounds, nonTTP, nondistended Skin: C/D/I. No apparent lesions. MsK: No joint swelling noted Extremities: R groin with large hematoma in the inguinal canal area, mildly tender, buising to area noted unchanged from prior days-- not reassessed this morning   Neuro: Alert awake, answering yes/no questions, able to speak more words today, continues to have limited verbal output.  Gesturing for communication sometimes, following simple commands Continued aphasia appears more expressive, dysarthric voice, mild right facial weakness Moving all 4 extremities to gravity and resistance   Assessment/Plan: 1. Functional deficits which require 3+ hours per day of interdisciplinary therapy  in a comprehensive inpatient rehab setting. Physiatrist is providing close team supervision and 24 hour management of active medical problems listed below. Physiatrist and rehab team continue to assess barriers to discharge/monitor patient progress toward functional and medical goals  Care Tool:  Bathing    Body parts bathed by patient: Right arm, Left arm, Chest, Abdomen, Front perineal area, Buttocks, Right upper leg, Left upper leg, Right lower leg, Left lower leg, Face         Bathing assist Assist Level: Contact Guard/Touching assist     Upper Body Dressing/Undressing Upper body dressing   What is the patient wearing?: Hospital gown only    Upper body assist Assist Level: Minimal Assistance - Patient > 75%    Lower Body Dressing/Undressing Lower body dressing      What is the patient wearing?: Underwear/pull up, Pants     Lower body assist Assist for lower body dressing: Minimal Assistance - Patient > 75%     Toileting Toileting    Toileting assist Assist for toileting: Contact Guard/Touching assist     Transfers Chair/bed transfer  Transfers assist     Chair/bed transfer assist level: Supervision/Verbal cueing     Locomotion Ambulation   Ambulation assist  Assist level: Contact Guard/Touching assist Assistive device: No Device Max distance: 172   Walk 10 feet activity   Assist     Assist level: Contact Guard/Touching assist Assistive device: No Device   Walk 50 feet activity   Assist    Assist level: Contact Guard/Touching assist Assistive device: No Device    Walk 150 feet activity   Assist Walk 150 feet activity did not occur: Safety/medical concerns  Assist level: Contact Guard/Touching assist Assistive device: No Device    Walk 10 feet on uneven surface  activity   Assist Walk 10 feet on uneven surfaces activity did not occur: Safety/medical concerns         Wheelchair     Assist Is the patient using a  wheelchair?: Yes Type of Wheelchair: Manual    Wheelchair assist level: Dependent - Patient 0% Max wheelchair distance: 250    Wheelchair 50 feet with 2 turns activity    Assist        Assist Level: Dependent - Patient 0%   Wheelchair 150 feet activity     Assist      Assist Level: Dependent - Patient 0%   Blood pressure (!) 140/61, pulse 76, temperature (!) 97.5 F (36.4 C), temperature source Oral, resp. rate 17, height 5\' 2"  (1.575 m), weight 68.9 kg, SpO2 100%.  Medical Problem List and Plan: 1. Functional deficits secondary to left MCA infarct due to severe left MCA stenosis; notable Hx prior L cerebellar CVAs             -patient may not shower while on continuous heparin  drip             -ELOS/Goals: 10 days, SPV PT/OT, Min A SLP             -04/21/23 CIR evals today; ordered SLP eval since it wasn't on there  -Continue CIR  -Repeat CT 2/4- discussed with neuro felt to be stable, neuro checking repeat CT today 2/5 and  ordered P2y12 test- 1  -Will change to 15/7 schedule  2.  R-CFV DVT (found 1/31)/Antithrombotics: -DVT/anticoagulation:  Pharmaceutical: Heparin  low dose X 48 hours and if stable transition to DOAC              -antiplatelet therapy: Brillinta. ASA d/c due to addition of Heparin .  - Per lit review, early mobilization current recommendation, without gross restrictions; no convincing evidence of increased PE risk.   -04/22/23 long discussion with pt and brother regarding clot location, this coincides with area of pain, large palpable ?hematoma to R groin; had mechanical thrombectomy-- called IR Dr. Elene Griffes who relayed to the team, Reagan Camera NP will come see the patient, appreciate their assistance with this case. For now, added Tramadol  100mg  q8h PRN to help with pain.  -04/23/23 Vas U/S with hematoma/continued DVT-smaller, Pt seen by IR this AM - recommend avoiding bending or stooping, limited mobility- transfers only for 1-2 days.  Ok to  transition to eliquis   -2/6 Dr. 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 Pain Management:  Tylenol  prn. Gabapentin  300mg  TID -04/22/23 added Tramadol  100mg  q8h PRN for pain in R groin (see above)-- pt allergic/intolerant to other hydro/oxycodone .  -2/4 scheduled Tylenol  -2/7 pain controlled overall, tramadol  as needed was restarted over the weekend 4. Mood/Behavior/Sleep:  LCSW to follow for evaluation and support.              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 Neuropsych/cognition: This patient is not capable of making decisions on her own behalf. 6. Skin/Wound Care: Routine pressure relief measures.  7. Fluids/Electrolytes/Nutrition: Monitor I/O. Check routine labs -04/21/23 Cr 1.19 a little up from yesterday but down from prior, monitor; K 3.3, see below; remainder of CMP unremarkable, monitor labs 8. HTN: Monitor BP TID--continue amlodipine  10mg  daily and hydralazine  50mg  q8h             --avoid hypoperfusion.   -2/4 intermittently elevated, continue to monitor trend for now.  Avoid hypotension  2/7 BP has been elevated, will start low-dose metoprolol  12.5 mg twice daily  -2/10 BP controlled, continue current regimen Vitals:   04/28/23 0559 04/28/23 0559 04/28/23 0858 04/28/23 1434  BP: (!) 128/59 (!) 124/59 138/66 (!) 150/66   04/28/23 1435 04/28/23 1444 04/28/23 1917 04/29/23 0541  BP: (!) 150/66 134/78 123/64 (!) 162/82   04/29/23 1528 04/29/23 2002 04/30/23 0438 04/30/23 1224  BP:  (!) 120/59 (!) 132/58 (!) 119/59 (!) 140/61    9. RLQ pain:  Felt to be due to IBS. Has been on miralax  daily and Senna S 2 tabs nightly without results --Linzess  72mcg every day was started today. Will get KUB  -04/21/23 KUB with gas filled colon but otherwise neg/nonobstructive; reviewed images myself, suspect stool burden contributory to gas trapped in R side; will increase miralax /colace to BID, leave senna S as is, add sorbitol  30ml PRN if no BM today; has PRN dulcolax supp and fleet if needed too; monitor closely -04/22/23 pt insists she's had BMs daily, but no documentation; no abd pain today, appears it's R groin pain-- see above #2-- tramadol  added, leave stool softeners in place, NEED DOCUMENTATION OF STOOLS CT abdomen on 2/4 neg for acute changes, mod stool -2/6 LBM today having more regular bowel movements -2/7-nursing reports patient's pain worsened after her Linzess , family indicates she may have been having pain with this medication previously.  Discussed with GI.  Will discontinue Linzess .  Will check x-ray for stool burden. -04/28/23 xray yesterday unremarkable. No c/o abd pain today. LBM 2 days ago but liquidy; Monitor.  -04/29/23 no abd discomfort today, LBM 2/6 liquidy, monitor for BM overnight, if none then may want to address it further.  -2/10 No abd pain today, LBM 2/6 will order sorbitol  45ml   10. Hypokalemia: Supplement added X 2 days.  -04/21/23 K 3.3, potassium supplement not ordered, 40mEq daily x2 days added today -2/10 potassium on the low side at 3.5 today, will order 20 mEq x 1 of potassium 11. Hyperlipidemia: LDL-124 on Lipitor  80 mg and Zetia  10mg  daily.  12. Pre-diabetes: Hgb A1c- 5.9 and every 4 hours CBS have shown BS controlled. --Will monitor fasting BS on serial labs.  13. Substance abuse: Educated on stroke risk w/marijuana.  14. H/o MM: Acute renal failure improving SCr 1.11 @ admission-->1.59-->1.07  -04/21/23 Cr 1.19, monitor  -2/3 Cr improved to 0.97  Recheck  labs, start IVF NS 75ml/hr due to poor intake   2/6 creatinine down to 0.92, DC IVF and encourage oral fluids  2/10  Creatinine stable at 0.85, continue to monitor fluid intake 15. GERD: On protonix  40mg  daily 16. Chest pain episode 2/6  -EKG NSR, Trop did not trend up. Pain resolved-suspect this may have been more abdominal pain  17. Oral candidiasis: noted by RN, nystatin  QID x7d ordered 18.  Anemia  -2/10 Hgb 11.2 I suspect this may be partially delusional as she was in similar range on 1/28-29.  Continue to monitor     LOS: 10 days A FACE TO FACE EVALUATION WAS PERFORMED  Lylia Sand 04/30/2023, 12:41 PM

## 2023-04-30 NOTE — Progress Notes (Signed)
 Speech Language Pathology Weekly Progress and Session Note  Patient Details  Name: Yanna Mouton MRN: [id number] Date of Birth: Aug 16, 1965  Beginning of progress report period: April 23, 2023 End of progress report period: April 30, 2023  Today's Date: 04/30/2023 SLP Individual Time: 1324-4010 SLP Individual Time Calculation (min): 38 min  Short Term Goals: Week 1: SLP Short Term Goal 1 (Week 1): Patient will answer basic environmental and biographical yes/no questions with 90% accuracy given max assist. SLP Short Term Goal 1 - Progress (Week 1): Met SLP Short Term Goal 2 (Week 1): Patient will follow 1-step directions with 90% accuracy given max assist. SLP Short Term Goal 2 - Progress (Week 1): Met SLP Short Term Goal 3 (Week 1): Patient will verbalize trained word-level functional utterances in 8/10 opportunities given max assist. SLP Short Term Goal 3 - Progress (Week 1): Not met SLP Short Term Goal 4 (Week 1): Patient will demonstrate use of recommended swallow safety strategies by clearing residue from the right buccal cavity during solid consumption in 4/5 opportunities given max verbal cues. SLP Short Term Goal 4 - Progress (Week 1): Met    New Short Term Goals: Week 2: SLP Short Term Goal 1 (Week 2): STGs=LTGs d/t ELOS  Weekly Progress Updates: Patient has made steady progress towards therapy goals, meeting 3 out of 4 short term goals set this reporting period. Patient currently tolerates Dys3/thin liquid diet when given max cues to clear pocketed material on the right side of mouth. She follows 1-step directions and answers basic environmental and biographical questions gesturally with 90% accuracy when provided with max assist. Patient continues to require total assist to verbalize single phoneme vowel sounds and 2-phoneme combinations. Patient and family education ongoing. Patient will continue to benefit from skilled therapy services during remainder of CIR stay.     Intensity: Minumum of 1-2 x/day, 30 to 90 minutes Frequency: 3 to 5 out of 7 days Duration/Length of Stay: 2/13 Treatment/Interventions: Multimodal communication approach;Speech/Language facilitation;Environmental controls;Cueing hierarchy;Functional tasks;Therapeutic Activities;Internal/external aids;Patient/family education;Dysphagia/aspiration precaution training  Daily Session  Skilled Therapeutic Interventions: SLP conducted skilled therapy session targeting communication goals. Patient declined to participate in swallowing therapy due to pain when repositioned into the upright position. Patient communicated primarily utilizing gestures and required max assist when using these gestures for communication partner to understand intent/message. Patient exhibits reduced verbalizations compared to evaluation and requires encouragement to attempt verbal communication. Throughout session, patient verbalized "okay", "what now?", and "one, two, three" spontaneously. Otherwise required total assist to approximate single phoneme vowel sounds and 2-phoneme combinations (e.g. /ma/). Patient benefited somewhat from physical articulatory placement from SLP and use of mirror to promote biofeedback. Patient was left in lowered bed with call bell in reach and bed alarm set. SLP will continue to target goals per plan of care.       Pain Pain Assessment Pain Scale: 0-10 Pain Score: 8   Therapy/Group: Individual Therapy  Rasheen Bells, M.A., CCC-SLP  Merdith Boyd A Hedi Barkan 04/30/2023, 11:06 AM

## 2023-04-30 NOTE — Progress Notes (Addendum)
 Patient ID: Brittany [name], female   DOB: [birth year], 58 y.o.   MRN: [id number]  Have contacted parents to try to schedule family eduction prior to discharge. Will await return call. Have spoken with [facility] and have resumed her PCS services for Friday. She will reach out to Mom to set up

## 2023-04-30 NOTE — Progress Notes (Signed)
 Occupational Therapy Session Note  Patient Details  Name: Brittany [name] MRN: [id number] Date of Birth: [birth year]  Today's Date: 04/30/2023 OT Individual Time: 9528-4132 OT Individual Time Calculation (min): 43 min  and Today's Date: 04/30/2023 OT Missed Time: 17 Minutes Missed Time Reason: Patient unwilling/refused to participate without medical reason  Today's Date: 04/30/2023 OT Individual Time: 4401-0272 OT Individual Time Calculation (min): 22 min   Short Term Goals: Week 2:  OT Short Term Goal 1 (Week 2): STGs=LTGs due to patient's estimated length of stay.  Skilled Therapeutic Interventions/Progress Updates:   Session 1: Pt received resting in bed, increased encouragement provided for participation in therapy session. Bed mobility with supervision + bed rail. Dependent for WC transport to therapy areas for energy conservation. Functional transfers without AD at CGA-supervision level, included tub/shower and walk-in shower.  9 Hole Peg Test is used to measure finger dexterity, pt scoring ~29 secs with L-hand, no instances of dropping, performance limited by communication deficits. Simulated house-keeping activity, as patient tasked with retrieving washcloths placed at varying heights, CGA for reaching towards floor. Functional mobility from day room>room with CGA progressing to close supervision, no AD. Pt returns to bed, missing ~10 mins of intervention due to fatigue.   Session 2: Pt received resting in bed, increased encouragement for participation in session. Focus on room-level mobility and ADL participation. All transfers with supervision + no AD. Standing shower with intermediate use of grab bar for balance, supervision-level. Standing dressing with CGA (unilateral support on BSC) for LB garment threading. Pt remained resting in bed, bed alarm activated.   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

## 2023-04-30 NOTE — Progress Notes (Signed)
 Patient refused sorbitol . Patient stated she already gone to the bathroom today. Educated patient on bowel regimen.

## 2023-05-01 DIAGNOSIS — K589 Irritable bowel syndrome without diarrhea: Secondary | ICD-10-CM | POA: Diagnosis not present

## 2023-05-01 DIAGNOSIS — D649 Anemia, unspecified: Secondary | ICD-10-CM | POA: Diagnosis not present

## 2023-05-01 DIAGNOSIS — F54 Psychological and behavioral factors associated with disorders or diseases classified elsewhere: Secondary | ICD-10-CM | POA: Diagnosis not present

## 2023-05-01 DIAGNOSIS — R1031 Right lower quadrant pain: Secondary | ICD-10-CM | POA: Diagnosis not present

## 2023-05-01 DIAGNOSIS — E876 Hypokalemia: Secondary | ICD-10-CM | POA: Diagnosis not present

## 2023-05-01 DIAGNOSIS — I10 Essential (primary) hypertension: Secondary | ICD-10-CM | POA: Diagnosis not present

## 2023-05-01 DIAGNOSIS — I63512 Cerebral infarction due to unspecified occlusion or stenosis of left middle cerebral artery: Secondary | ICD-10-CM | POA: Diagnosis not present

## 2023-05-01 DIAGNOSIS — N179 Acute kidney failure, unspecified: Secondary | ICD-10-CM | POA: Diagnosis not present

## 2023-05-01 NOTE — Progress Notes (Signed)
PROGRESS NOTE   Subjective/Complaints:  Patient working with OT in her room this morning in bed.  She continues to have some pain in her right inguinal region, indicates it is gradually improving.   She declined sorbitol yesterday due to having a recent bowel movement.  Today she confirms that she had a bowel movement yesterday.  ROS: limited due to aphasia. Denies CP, SOB, n/v, HA  Objective:   No results found.  Recent Labs    04/30/23 0537  WBC 5.1  HGB 11.2*  HCT 34.1*  PLT 209    Recent Labs    04/30/23 0537  NA 142  K 3.5  CL 104  CO2 23  GLUCOSE 93  BUN 7  CREATININE 0.85  CALCIUM 9.4          Intake/Output Summary (Last 24 hours) at 05/01/2023 1023 Last data filed at 04/30/2023 1200 Gross per 24 hour  Intake 240 ml  Output --  Net 240 ml        Physical Exam: Vital Signs Blood pressure 131/66, pulse 73, temperature 97.7 F (36.5 C), temperature source Oral, resp. rate 18, height 5\' 2"  (1.575 m), weight 68.9 kg, SpO2 100%.  Constitutional: NAD, working with OT picking out her meals HENT:   Atraumatic, normocephalic. Eyes: PERRLA. EOM seem intact. No notable nystagmus.  Cardiovascular: RRR, no murmurs/rub/gallops. No Edema. Peripheral pulses 2+  Respiratory: CTAB. No rales, rhonchi, or wheezing. On RA.  Abdomen: soft, + bowel sounds, nonTTP, nondistended Skin: C/D/I. No apparent lesions. MsK: No joint swelling noted Extremities: R groin with large hematoma in the inguinal canal area, mild TTP in this region   Neuro: Alert awake, answering yes/no questions appropriately more than not, able to say a few other words such as "okay" .Continues to have limited verbal output.  Gesturing for communication sometimes, following simple commands Continued aphasia  expressive more than receptive, dysarthric voice, mild right facial weakness Moving all 4 extremities to gravity and  resistance   Assessment/Plan: 1. Functional deficits which require 3+ hours per day of interdisciplinary therapy in a comprehensive inpatient rehab setting. Physiatrist is providing close team supervision and 24 hour management of active medical problems listed below. Physiatrist and rehab team continue to assess barriers to discharge/monitor patient progress toward functional and medical goals  Care Tool:  Bathing    Body parts bathed by patient: Right arm, Left arm, Chest, Abdomen, Front perineal area, Buttocks, Right upper leg, Left upper leg, Right lower leg, Left lower leg, Face         Bathing assist Assist Level: Contact Guard/Touching assist     Upper Body Dressing/Undressing Upper body dressing   What is the patient wearing?: Hospital gown only    Upper body assist Assist Level: Minimal Assistance - Patient > 75%    Lower Body Dressing/Undressing Lower body dressing      What is the patient wearing?: Underwear/pull up, Pants     Lower body assist Assist for lower body dressing: Minimal Assistance - Patient > 75%     Toileting Toileting    Toileting assist Assist for toileting: Contact Guard/Touching assist     Transfers Chair/bed transfer  Transfers  assist     Chair/bed transfer assist level: Supervision/Verbal cueing     Locomotion Ambulation   Ambulation assist      Assist level: Contact Guard/Touching assist Assistive device: No Device Max distance: 172   Walk 10 feet activity   Assist     Assist level: Contact Guard/Touching assist Assistive device: No Device   Walk 50 feet activity   Assist    Assist level: Contact Guard/Touching assist Assistive device: No Device    Walk 150 feet activity   Assist Walk 150 feet activity did not occur: Safety/medical concerns  Assist level: Contact Guard/Touching assist Assistive device: No Device    Walk 10 feet on uneven surface  activity   Assist Walk 10 feet on uneven  surfaces activity did not occur: Safety/medical concerns         Wheelchair     Assist Is the patient using a wheelchair?: Yes Type of Wheelchair: Manual    Wheelchair assist level: Dependent - Patient 0% Max wheelchair distance: 250    Wheelchair 50 feet with 2 turns activity    Assist        Assist Level: Dependent - Patient 0%   Wheelchair 150 feet activity     Assist      Assist Level: Dependent - Patient 0%   Blood pressure 131/66, pulse 73, temperature 97.7 F (36.5 C), temperature source Oral, resp. rate 18, height 5\' 2"  (1.575 m), weight 68.9 kg, SpO2 100%.  Medical Problem List and Plan: 1. Functional deficits secondary to left MCA infarct due to severe left MCA stenosis; notable Hx prior L cerebellar CVAs             -patient may not shower while on continuous heparin drip             -ELOS/Goals: 10 days, SPV PT/OT, Min A SLP             -04/21/23 CIR evals today; ordered SLP eval since it wasn't on there  -Continue CIR  -Repeat CT 2/4- discussed with neuro felt to be stable, neuro checking repeat CT today 2/5 and  ordered P2y12 test- 1  -Will change to 15/7 schedule   -Team conference tomorrow 2.  R-CFV DVT (found 1/31)/Antithrombotics: -DVT/anticoagulation:  Pharmaceutical: Heparin low dose X 48 hours and if stable transition to DOAC              -antiplatelet therapy: Brillinta. ASA d/c due to addition of Heparin.  - Per lit review, early mobilization current recommendation, without gross restrictions; no convincing evidence of increased PE risk.   -04/22/23 long discussion with pt and brother regarding clot location, this coincides with area of pain, large palpable ?hematoma to R groin; had mechanical thrombectomy-- called IR Dr. Richarda Overlie who relayed to the team, Anders Grant NP will come see the patient, appreciate their assistance with this case. For now, added Tramadol 100mg  q8h PRN to help with pain.  -04/23/23 Vas U/S with  hematoma/continued DVT-smaller, Pt seen by IR this AM - recommend avoiding bending or stooping, limited mobility- transfers only for 1-2 days.  Ok to transition to eliquis  -2/6 Dr. Corliss Skains recommends 1 more day of light activity than full activity Friday -2/7 OK for full activity today, neurology adjusting Brilinta dose to 45 mg BID given results of P2y12, she is also on Eliquis 5 mg twice daily   3. Pain Management:  Tylenol prn. Gabapentin 300mg  TID -04/22/23 added Tramadol 100mg  q8h PRN for pain  in R groin (see above)-- pt allergic/intolerant to other hydro/oxycodone.  -2/4 scheduled Tylenol -2/7 pain controlled overall, tramadol as needed was restarted over the weekend -2/11 continue as needed tramadol for now 4. Mood/Behavior/Sleep: LCSW to follow for evaluation and support.              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 Neuropsych/cognition: This patient is not capable of making decisions on her own behalf. 6. Skin/Wound Care: Routine pressure relief measures.  7. Fluids/Electrolytes/Nutrition: Monitor I/O. Check routine labs -04/21/23 Cr 1.19 a little up from yesterday but down from prior, monitor; K 3.3, see below; remainder of CMP unremarkable, monitor labs 8. HTN: Monitor BP TID--continue amlodipine 10mg  daily and hydralazine 50mg  q8h             --avoid hypoperfusion.   -2/4 intermittently elevated, continue to monitor trend for now.  Avoid hypotension  2/7 BP has been elevated, will start low-dose metoprolol 12.5 mg twice daily  -2/11 BP mildly elevated at times, will continue to monitor for now as would like to avoid hypotension    05/01/2023    5:05 AM  04/30/2023    8:23 PM 04/30/2023   12:24 PM  Vitals with BMI  Systolic 131 142 865  Diastolic 66 59 61  Pulse  73 76    9. RLQ pain:  Felt to be due to IBS. Has been on miralax daily and Senna S 2 tabs nightly without results --Linzess every day was started today. Will get KUB  -04/21/23 KUB with gas filled colon but otherwise neg/nonobstructive; reviewed images myself, suspect stool burden contributory to gas trapped in R side; will increase miralax/colace to BID, leave senna S as is, add sorbitol 30ml PRN if no BM today; has PRN dulcolax supp and fleet if needed too; monitor closely -04/22/23 pt insists she's had BMs daily, but no documentation; no abd pain today, appears it's R groin pain-- see above #2-- tramadol added, leave stool softeners in place, NEED DOCUMENTATION OF STOOLS CT abdomen on 2/4 neg for acute changes, mod stool -2/6 LBM today having more regular bowel movements -2/7-nursing reports patient's pain worsened after her Linzess, family indicates she may have been having pain with this medication previously.  Discussed with GI.  Will discontinue Linzess.  Will check x-ray for stool burden. -04/28/23 xray yesterday unremarkable. No c/o abd pain today. LBM 2 days ago but liquidy; Monitor.  -04/29/23 no abd discomfort today, LBM 2/6 liquidy, monitor for BM overnight, if none then may want to address it further.  -2/10 No abd pain today, LBM 2/6 will order sorbitol 45ml -2/11 patient declined sorbitol, reports bowel movement yesterday.  And also appears she is declining some of her as needed medications.  Continue current regimen and monitor for now   10. Hypokalemia: Supplement added X 2 days.  -04/21/23 K 3.3, potassium supplement not ordered, daily x2 days added today -2/10 potassium on the low side at 3.5 today, will order 20 mEq x 1 of potassium -Recheck levels Thursday 11. Hyperlipidemia: LDL-124 on Lipitor 80 mg and Zetia 10mg  daily.  12. Pre-diabetes: Hgb A1c- 5.9 and  every 4 hours CBS have shown BS controlled. --Will monitor fasting BS on  serial labs.  13. Substance abuse: Educated on stroke risk w/marijuana.  14. H/o MM: Acute renal failure improving SCr 1.11 @ admission-->1.59-->1.07  -04/21/23 Cr 1.19, monitor  -2/3 Cr improved to 0.97  Recheck labs, start IVF NS 81ml/hr due to poor intake   2/6 creatinine down to 0.92, DC IVF and encourage oral fluids  2/10 Creatinine stable at 0.85, continue to monitor fluid intake  2/11 continuing to encourage p.o. fluid intake 15. GERD: On protonix 40mg  daily 16. Chest pain episode 2/6  -EKG NSR, Trop did not trend up. Pain resolved-suspect this may have been more abdominal pain  17. Oral candidiasis: noted by RN, nystatin QID x7d ordered 18.  Anemia  -2/10 Hgb 11.2 I suspect this may be partially delusional as she was in similar range on 1/28-29.    -Recheck labs Thursday     LOS: 11 days A FACE TO FACE EVALUATION WAS PERFORMED  Fanny Dance 05/01/2023, 10:23 AM

## 2023-05-01 NOTE — Progress Notes (Signed)
Patient ID: Brittany [name], female   DOB: [birth year], 58 y.o.   MRN: [id number]  Met with pt and her parents who are here for family training. It went well and they are pleased with daughters progress. Aware discharge set for Thursday and feel comfortable with this. Mom to get business pt receives her supplies from-pads, depends, etc and this worker will call to change the address of where to sent them too. Equipment has been delivered and in her room. Work toward discharge Thursday.

## 2023-05-01 NOTE — Progress Notes (Signed)
Occupational Therapy Session Note  Patient Details  Name: Brittany [name] MRN: [id number] Date of Birth: 04/16/1965  {CHL IP REHAB OT TIME CALCULATIONS:304400400}   Short Term Goals: Week 2:  OT Short Term Goal 1 (Week 2): STGs=LTGs due to patient's estimated length of stay.  Skilled Therapeutic Interventions/Progress Updates:  Pt greeted *** for skilled OT session with focus on ***.   Pain: Pt reported ***/10 pain, stating "***" in reference to ***. OT offering intermediate rest breaks and positioning suggestions throughout session to address pain/fatigue and maximize participation/safety in session.   Functional Transfers:  Self Care Tasks: Pt completes the following self care tasks with levels of assistance noted below, UB: LB:   Therapeutic Activities:  Therapeutic Exercise:   Education:  Pt remained *** with 4Ps assessed and immediate needs met. Pt continues to be appropriate for skilled OT intervention to promote further functional independence in ADLs/IADLs.    Therapy Documentation Precautions:  Precautions Precautions: Fall Precaution/Restrictions Comments: R-hemi, R-inattention, watch BP, global aphasia (expressive>receptive), RLE DVT (no limitations according to H&P) Restrictions Weight Bearing Restrictions Per Provider Order: No   Therapy/Group: Individual Therapy  Lou Cal, OTR/L, MSOT  05/01/2023, 9:34 PM

## 2023-05-01 NOTE — Progress Notes (Signed)
Speech Language Pathology Daily Session Note  Patient Details  Name: Anastasha Ortez MRN: [id number] Date of Birth: Dec 26, 1965  Today's Date: 05/01/2023 SLP Individual Time: 1305-1330 SLP Individual Time Calculation (min): 25 min  Short Term Goals: Week 2: SLP Short Term Goal 1 (Week 2): STGs=LTGs d/t ELOS  Skilled Therapeutic Interventions: SLP conducted skilled therapy session targeting communication and family education. Mother and father present throughout session. Patient reporting hip pain gesturally, however RN reports that pain medications were just administered. SLP educated family re: communication deficits and helpful strategies to enhance communication after discharge. Reviewed repetition, multimodal cuing, modeling, and communication repair techniques with parents demonstrating understanding and asking questions appropriately. Targeted object naming via verbalization and written format. Patient requires total assist to approximate word level utterances and benefits from models and copying to accurately write targets. Patient perseverated on various written words, requiring max to total assist to break perseverative moments. After writing one word with hand over hand assist, patient made first and only spontaneous (non-automatic) verbalization of the session (I can wite (write)!). Patient was left in lowered bed with call bell in reach and bed alarm set. SLP will continue to target goals per plan of care.       Pain Pain Assessment Pain Scale: Faces Faces Pain Scale: Hurts little more Pain Location: Hip  Therapy/Group: Individual Therapy  Jeannie Done, M.A., CCC-SLP  Yetta Barre 05/01/2023, 2:56 PM

## 2023-05-01 NOTE — Progress Notes (Signed)
Occupational Therapy Session Note  Patient Details  Name: Brittany [name] MRN: [id number] Date of Birth: 20-Nov-1965  Today's Date: 05/01/2023 OT Individual Time: 4098-1191 OT Individual Time Calculation (min): 40 min  and Today's Date: 05/01/2023 OT Missed Time: 15 Minutes Missed Time Reason: Pain;Patient fatigue  Today's Date: 05/01/2023 OT Individual Time: 1350-1415 OT Individual Time Calculation (min): 25 min   Short Term Goals: Week 2:  OT Short Term Goal 1 (Week 2): STGs=LTGs due to patient's estimated length of stay.  Skilled Therapeutic Interventions/Progress Updates:   Session 1: Pt greeted in the middle of attempting communication with NT.   Pain: Pt with un-rated pain in R groin, LPN made aware. OT offering intermediate rest breaks and positioning suggestions throughout session to address pain/fatigue and maximize participation/safety in session.   Functional Transfers: Bed mobility with supervision, sit<>stands in similar fashion. More than household level mobility (room<>day room) with CGA (due to increased pain at groin) + rollator, min cuing for locking.   Self Care Tasks: Pt attempting to communicate meal preferences, OT finally able to communicate wanting a "grilled cheese sandwich" after OT shows images of food items on computer.   Pt missing ~15 mins of skilled intervention due to increased pain/fatigue.   Pt remained resting in bed with 4Ps assessed and immediate needs met. Pt continues to be appropriate for skilled OT intervention to promote further functional independence in ADLs/IADLs.   Session 2: Pt greeted resting in bed for skilled OT session with focus on caregiver education.   Pain: Pt with no reports of pain, OT offering intermediate rest breaks and positioning suggestions throughout session to address pain/fatigue and maximize participation/safety in session.   Self Care Tasks: Pt's parents present for caregiver education with focus on OT role,  OT POC, and current patient functioning. Functional transfers (including use of rollator), full-body dressing/bathing, and impacts of communication deficits reviewed. Pt's caregivers compete hands-on training/observation of functional transfers, including walk-in shower, all questions answered at end of session.   Pt remained in care of PT with 4Ps assessed and immediate needs met. Pt continues to be appropriate for skilled OT intervention to promote further functional independence in ADLs/IADLs.   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), RLE DVT (no limitations according to H&P) Restrictions Weight Bearing Restrictions Per Provider Order: No   Therapy/Group: Individual Therapy  Lou Cal, OTR/L, MSOT  05/01/2023, 6:19 AM

## 2023-05-01 NOTE — Progress Notes (Signed)
Occupational Therapy Discharge Summary  Patient Details  Name: Brittany [name] MRN: [id number] Date of Birth: 08-25-1965  Date of Discharge from OT service:May 01, 2023  Patient has met {NUMBERS 0-12:18577} of {NUMBERS 0-12:18577} long term goals due to {due to:3041651}.  Patient to discharge at overall {LOA:3049010} level.  Patient's care partner {care partner:[id number]} to provide the necessary {assistance:3041652} assistance at discharge.    Reasons goals not met: ***  Recommendation:  Patient will benefit from ongoing skilled OT services in home health setting to continue to advance functional skills in the area of BADL, iADL, and Reduce care partner burden.  Equipment: Rollator, BSC, & TTB  Reasons for discharge: treatment goals met and discharge from hospital  Patient/family agrees with progress made and goals achieved: Yes  OT Discharge Precautions/Restrictions  Precautions Precautions: Fall Precaution/Restrictions Comments: R-hemi, R-inattention, watch BP, global aphasia, RLE DVT (no limitations according to H&P) Restrictions Weight Bearing Restrictions Per Provider Order: No ADL ADL Eating: Supervision/safety Where Assessed-Eating: Edge of bed Grooming: Supervision/safety Where Assessed-Grooming: Standing at sink Upper Body Bathing: Supervision/safety Where Assessed-Upper Body Bathing: Shower Lower Body Bathing: Supervision/safety Where Assessed-Lower Body Bathing: Shower Upper Body Dressing: Supervision/safety Where Assessed-Upper Body Dressing: Edge of bed Lower Body Dressing: Supervision/safety Where Assessed-Lower Body Dressing: Edge of bed Toileting: Supervision/safety Where Assessed-Toileting: Toilet, Bedside Commode Toilet Transfer: Distant supervision Toilet Transfer Method: Proofreader: Gaffer: Close supervison Web designer Method: Ship broker: Administrator, Acupuncturist: Close supervision Film/video editor Method: Designer, industrial/product: Information systems manager without back, Grab bars Vision Baseline Vision/History: 1 Wears glasses Patient Visual Report: No change from baseline Vision Assessment?: Yes Eye Alignment: Within Functional Limits Ocular Range of Motion: Within Functional Limits Alignment/Gaze Preference: Within Defined Limits Tracking/Visual Pursuits: Able to track stimulus in all quads without difficulty Perception  Perception: Impaired Praxis Praxis: WFL Cognition Cognition Overall Cognitive Status: Difficult to assess Arousal/Alertness: Awake/alert Awareness: Impaired Awareness Impairment: Intellectual impairment Behaviors: Restless;Poor frustration tolerance Safety/Judgment: Impaired Comments: Slight impulsivity during mobility. Brief Interview for Mental Status (BIMS) Repetition of Three Words (First Attempt): No answer (Unable to answer due to aphasia.) Temporal Orientation: Year: No answer Temporal Orientation: Month: No answer Temporal Orientation: Day: No answer Recall: "Sock": No answer Recall: "Blue": No answer Recall: "Bed": No answer BIMS Summary Score: 99 Sensation Sensation Light Touch: Impaired Detail Peripheral sensation comments: History of peripheral nueropathy Light Touch Impaired Details: Impaired RLE;Impaired LLE Hot/Cold: Appears Intact Proprioception: Impaired by gross assessment Stereognosis: Impaired by gross assessment Coordination Gross Motor Movements are Fluid and Coordinated: No Fine Motor Movements are Fluid and Coordinated: No Coordination and Movement Description: Deficits due to mild R-sided hemiparesis and mild R-sided inattention. Motor  Motor Motor: Other (comment) Motor - Skilled Clinical Observations: Deficits due to R-sided hemiparesis and mild R-sided inattention. Mobility  Transfers Sit to Stand: Supervision/Verbal cueing Stand  to Sit: Supervision/Verbal cueing  Trunk/Postural Assessment  Cervical Assessment Cervical Assessment: Exceptions to [facility] (Forward head) Thoracic Assessment Thoracic Assessment: Exceptions to [facility] (Rounded shoulders) Lumbar Assessment Lumbar Assessment: Exceptions to [facility] (Posterior pelvic tilt) Postural Control Postural Control: Deficits on evaluation Righting Reactions: Delayed Protective Responses: Delayed  Balance Balance Balance Assessed: Yes Static Sitting Balance Static Sitting - Balance Support: Feet supported Static Sitting - Level of Assistance: 7: Independent Dynamic Sitting Balance Dynamic Sitting - Balance Support: During functional activity;Feet supported Dynamic Sitting - Level of Assistance: 5: Stand by assistance (SUP) Dynamic Sitting - Balance Activities: Lateral lean/weight  shifting;Forward lean/weight shifting;Reaching for objects Static Standing Balance Static Standing - Balance Support: During functional activity;No upper extremity supported Static Standing - Level of Assistance: 5: Stand by assistance (SUP) Dynamic Standing Balance Dynamic Standing - Balance Support: During functional activity;No upper extremity supported Dynamic Standing - Level of Assistance: 5: Stand by assistance (SUP) Dynamic Standing - Balance Activities: Lateral lean/weight shifting;Forward lean/weight shifting;Reaching for objects Extremity/Trunk Assessment RUE Assessment RUE Assessment: Exceptions to [facility] Brunstrum level for arm: Stage V Relative Independence from [employer] Brunstrum level for hand: Stage VI Isolated joint movements LUE Assessment LUE Assessment: Within Functional Limits   Lou Cal, OTR/L, MSOT  05/01/2023, 9:45 PM

## 2023-05-01 NOTE — Progress Notes (Signed)
Physical Therapy Session Note  Patient Details  Name: Brittany [name] MRN: [id number] Date of Birth: 1965/10/07  Today's Date: 05/01/2023 PT Individual Time: 1415-1444 PT Individual Time Calculation (min): 29 min   Short Term Goals: Week 2:  PT Short Term Goal 1 (Week 2): STG=LTG 2/2 ELOS  Skilled Therapeutic Interventions/Progress Updates:      Pt received from OT.  Pt agreeeable to therapy. Pt denies any pain.   Pt parents present for family training. Education provided for 24/7 supervision needed for cognitive deficits and sequencing for safety.   Education provided for poor endurance 2/2 asthma, fatigue, SOB. Education provided for technique with seated rest break on rollator, with emphasis of pt needing reminders to position against stable surface and lock brakes, prior to sitting/standing. Education provided for technique for stand pivot transfer to car simulator.   Education provided for need for rollator for long distance ambulation however pt able to walk without AD for short distance ambulation in house with supervision. Education provided how to fold rollator for transport. Recommended family bring rollator anytime they are leaving house.    Pt parents returned demonstration of cues/reminders to pt, and are very pleased with pt CLOF. Pt, and family deny any questions regarding discharge. Plan to provide pt with HEP for higher level balance next session.         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, , DPT  05/01/2023, 8:08 AM

## 2023-05-02 ENCOUNTER — Inpatient Hospital Stay (HOSPITAL_COMMUNITY): Payer: Medicaid Other

## 2023-05-02 DIAGNOSIS — I10 Essential (primary) hypertension: Secondary | ICD-10-CM | POA: Diagnosis not present

## 2023-05-02 DIAGNOSIS — F54 Psychological and behavioral factors associated with disorders or diseases classified elsewhere: Secondary | ICD-10-CM | POA: Diagnosis not present

## 2023-05-02 DIAGNOSIS — D649 Anemia, unspecified: Secondary | ICD-10-CM | POA: Diagnosis not present

## 2023-05-02 DIAGNOSIS — E876 Hypokalemia: Secondary | ICD-10-CM | POA: Diagnosis not present

## 2023-05-02 DIAGNOSIS — R1031 Right lower quadrant pain: Secondary | ICD-10-CM | POA: Diagnosis not present

## 2023-05-02 DIAGNOSIS — N179 Acute kidney failure, unspecified: Secondary | ICD-10-CM | POA: Diagnosis not present

## 2023-05-02 DIAGNOSIS — I63512 Cerebral infarction due to unspecified occlusion or stenosis of left middle cerebral artery: Secondary | ICD-10-CM | POA: Diagnosis not present

## 2023-05-02 DIAGNOSIS — K589 Irritable bowel syndrome without diarrhea: Secondary | ICD-10-CM | POA: Diagnosis not present

## 2023-05-02 LAB — BASIC METABOLIC PANEL
Anion gap: 13 (ref 5–15)
BUN: 8 mg/dL (ref 6–20)
CO2: 23 mmol/L (ref 22–32)
Calcium: 9.2 mg/dL (ref 8.9–10.3)
Chloride: 104 mmol/L (ref 98–111)
Creatinine, Ser: 0.81 mg/dL (ref 0.44–1.00)
GFR, Estimated: >60 mL/min (ref >60)
Glucose, Bld: 112 mg/dL — ABNORMAL HIGH (ref 70–99)
Potassium: 3.4 mmol/L — ABNORMAL LOW (ref 3.5–5.1)
Sodium: 140 mmol/L (ref 135–145)

## 2023-05-02 LAB — CBC
HCT: 35.4 % — ABNORMAL LOW (ref 36.0–46.0)
Hemoglobin: 11.7 g/dL — ABNORMAL LOW (ref 12.0–15.0)
MCH: 28.9 pg (ref 26.0–34.0)
MCHC: 33.1 g/dL (ref 30.0–36.0)
MCV: 87.4 fL (ref 80.0–100.0)
Platelets: 234 10*3/uL (ref 150–400)
RBC: 4.05 MIL/uL (ref 3.87–5.11)
RDW: 14.4 % (ref 11.5–15.5)
WBC: 6.5 10*3/uL (ref 4.0–10.5)
nRBC: 0 % (ref 0.0–0.2)

## 2023-05-02 MED ORDER — MAGNESIUM CITRATE PO SOLN
1.0000 | Freq: Once | ORAL | Status: DC
Start: 2023-05-02 — End: 2023-05-03
  Filled 2023-05-02 (×2): qty 296

## 2023-05-02 MED ORDER — POTASSIUM CHLORIDE 20 MEQ PO PACK
40.0000 meq | PACK | Freq: Two times a day (BID) | ORAL | Status: DC
  Administered 2023-05-02 – 2023-05-03 (×3): 40 meq via ORAL
  Filled 2023-05-02 (×3): qty 2

## 2023-05-02 MED ORDER — LISINOPRIL 5 MG PO TABS
2.5000 mg | ORAL_TABLET | Freq: Every day | ORAL | Status: DC
  Administered 2023-05-02 – 2023-05-03 (×2): 2.5 mg via ORAL
  Filled 2023-05-02 (×2): qty 1

## 2023-05-02 MED ORDER — LISINOPRIL 5 MG PO TABS: 5.0000 mg | ORAL_TABLET | Freq: Every day | ORAL | Status: DC

## 2023-05-02 MED ORDER — TRAMADOL HCL 50 MG PO TABS
100.0000 mg | ORAL_TABLET | Freq: Four times a day (QID) | ORAL | Status: DC | PRN
  Administered 2023-05-02 – 2023-05-03 (×4): 100 mg via ORAL
  Filled 2023-05-02 (×4): qty 2

## 2023-05-02 NOTE — Plan of Care (Signed)
  Problem: RH Balance Goal: LTG Patient will maintain dynamic standing balance (PT) Description: LTG:  Patient will maintain dynamic standing balance with assistance during mobility activities (PT) Outcome: Completed/Met   Problem: Sit to Stand Goal: LTG:  Patient will perform sit to stand with assistance level (PT) Description: LTG:  Patient will perform sit to stand with assistance level (PT) Outcome: Completed/Met   Problem: RH Bed Mobility Goal: LTG Patient will perform bed mobility with assist (PT) Description: LTG: Patient will perform bed mobility with assistance, with/without cues (PT). Outcome: Completed/Met   Problem: RH Bed to Chair Transfers Goal: LTG Patient will perform bed/chair transfers w/assist (PT) Description: LTG: Patient will perform bed to chair transfers with assistance (PT). Outcome: Completed/Met   Problem: RH Furniture Transfers Goal: LTG Patient will perform furniture transfers w/assist (OT/PT) Description: LTG: Patient will perform furniture transfers  with assistance (OT/PT). Outcome: Completed/Met   Problem: RH Ambulation Goal: LTG Patient will ambulate in controlled environment (PT) Description: LTG: Patient will ambulate in a controlled environment, # of feet with assistance (PT). Outcome: Completed/Met Goal: LTG Patient will ambulate in home environment (PT) Description: LTG: Patient will ambulate in home environment, # of feet with assistance (PT). Outcome: Completed/Met   Problem: RH Stairs Goal: LTG Patient will ambulate up and down stairs w/assist (PT) Description: LTG: Patient will ambulate up and down # of stairs with assistance (PT) Outcome: Completed/Met   Problem: RH Car Transfers Goal: LTG Patient will perform car transfers with assist (PT) Description: LTG: Patient will perform car transfers with assistance (PT). Outcome: Completed/Met

## 2023-05-02 NOTE — Progress Notes (Addendum)
PROGRESS NOTE   Subjective/Complaints:  Pt took PRN sorbitol yesterday.  Reports she had BM yesterday-not recorded. Continues to have pain inguinal area right.   ROS: limited due to aphasia. Denies CP, SOB, n/v, constipation  Objective:   No results found.  Recent Labs    04/30/23 0537  WBC 5.1  HGB 11.2*  HCT 34.1*  PLT 209    Recent Labs    04/30/23 0537  NA 142  K 3.5  CL 104  CO2 23  GLUCOSE 93  BUN 7  CREATININE 0.85  CALCIUM 9.4          Intake/Output Summary (Last 24 hours) at 05/02/2023 1015 Last data filed at 05/02/2023 0750 Gross per 24 hour  Intake 240 ml  Output --  Net 240 ml         Physical Exam: Vital Signs Blood pressure (!) 149/66, pulse (!) 59, temperature 97.6 F (36.4 C), temperature source Oral, resp. rate 16, height 5\' 2"  (1.575 m), weight 68.9 kg, SpO2 100%.  Constitutional: NAD, working with OT picking out her meals HENT:   Atraumatic, normocephalic. Eyes: PERRLA. EOM seem intact. No notable nystagmus.  Cardiovascular: RRR, no murmurs/rub/gallops. No Edema. Peripheral pulses 2+  Respiratory: CTAB. No rales, rhonchi, or wheezing. On RA.  Abdomen: soft, + bowel sounds, nonTTP, nondistended Skin: C/D/I. No apparent lesions. MsK: No joint swelling noted Extremities: R groin with hematoma in the inguinal canal area-a little smaller today, mild TTP in this region, bruising improving   Neuro: Alert awake, answering yes/no questions appropriately more than not .Continues to have limited verbal output.  Gesturing for communication, following simple commands Continued aphasia  expressive more than receptive, dysarthric voice, mild right facial weakness Moving all 4 extremities to gravity and resistance   Assessment/Plan: 1. Functional deficits which require 3+ hours per day of interdisciplinary therapy in a comprehensive inpatient rehab setting. Physiatrist is providing close  team supervision and 24 hour management of active medical problems listed below. Physiatrist and rehab team continue to assess barriers to discharge/monitor patient progress toward functional and medical goals  Care Tool:  Bathing    Body parts bathed by patient: Right arm, Left arm, Chest, Abdomen, Front perineal area, Buttocks, Right upper leg, Left upper leg, Right lower leg, Left lower leg, Face         Bathing assist Assist Level: Supervision/Verbal cueing     Upper Body Dressing/Undressing Upper body dressing   What is the patient wearing?: Pull over shirt    Upper body assist Assist Level: Independent with assistive device    Lower Body Dressing/Undressing Lower body dressing      What is the patient wearing?: Underwear/pull up, Pants     Lower body assist Assist for lower body dressing: Supervision/Verbal cueing     Toileting Toileting    Toileting assist Assist for toileting: Supervision/Verbal cueing     Transfers Chair/bed transfer  Transfers assist     Chair/bed transfer assist level: Supervision/Verbal cueing     Locomotion Ambulation   Ambulation assist      Assist level: Contact Guard/Touching assist Assistive device: No Device Max distance: 172   Walk 10 feet activity  Assist     Assist level: Contact Guard/Touching assist Assistive device: No Device   Walk 50 feet activity   Assist    Assist level: Contact Guard/Touching assist Assistive device: No Device    Walk 150 feet activity   Assist Walk 150 feet activity did not occur: Safety/medical concerns  Assist level: Contact Guard/Touching assist Assistive device: No Device    Walk 10 feet on uneven surface  activity   Assist Walk 10 feet on uneven surfaces activity did not occur: Safety/medical concerns         Wheelchair     Assist Is the patient using a wheelchair?: Yes Type of Wheelchair: Manual    Wheelchair assist level: Dependent - Patient  0% Max wheelchair distance: 250    Wheelchair 50 feet with 2 turns activity    Assist        Assist Level: Dependent - Patient 0%   Wheelchair 150 feet activity     Assist      Assist Level: Dependent - Patient 0%   Blood pressure (!) 149/66, pulse (!) 59, temperature 97.6 F (36.4 C), temperature source Oral, resp. rate 16, height 5\' 2"  (1.575 m), weight 68.9 kg, SpO2 100%.  Medical Problem List and Plan: 1. 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 Formulized individual treatment plan based on medical history, underlying problem and comorbidities.   2.  R-CFV DVT (found 1/31)/Antithrombotics: -DVT/anticoagulation:  Pharmaceutical: Heparin low dose X 48 hours and if stable transition to DOAC              -antiplatelet therapy: Brillinta. ASA d/c due to addition of Heparin.  - Per lit review, early mobilization current recommendation, without gross restrictions; no convincing evidence of increased PE risk.   -04/22/23 long discussion with pt and brother regarding clot location, this coincides with area of pain, large palpable ?hematoma to R groin; had mechanical thrombectomy-- called IR Dr. Richarda Overlie who relayed to the team, Anders Grant NP will come see the patient, appreciate their assistance with this case. For now, added Tramadol 100mg  q8h PRN to help with pain.  -04/23/23 Vas U/S with  hematoma/continued DVT-smaller, Pt seen by IR this AM - recommend avoiding bending or stooping, limited mobility- transfers only for 1-2 days.  Ok to transition to eliquis  -2/6 Dr. Corliss Skains recommends 1 more day of light activity than full activity Friday -2/7 OK for full activity today, neurology adjusting Brilinta dose to 45 mg BID given results of P2y12, she is also on Eliquis 5 mg twice daily   3. Pain Management:  Tylenol prn. Gabapentin 300mg  TID -04/22/23 added Tramadol 100mg  q8h PRN for pain in R groin (see above)-- pt allergic/intolerant to other hydro/oxycodone.  -2/4 scheduled  Tylenol -2/7 pain controlled overall, tramadol as needed was restarted over the weekend -2/12 Intermittent use of Tramadol PRN, adjust to q6h 4. Mood/Behavior/Sleep: LCSW to follow for evaluation and support.              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 Neuropsych/cognition: This patient is not capable of making decisions on her own behalf. 6. Skin/Wound Care: Routine pressure relief measures.  7. Fluids/Electrolytes/Nutrition: Monitor I/O. Check routine labs -04/21/23 Cr 1.19 a little up from yesterday but down from prior, monitor; K 3.3, see below; remainder of CMP unremarkable, monitor labs 8. HTN: Monitor BP TID--continue amlodipine 10mg  daily and hydralazine 50mg  q8h             --avoid hypoperfusion.   -2/4 intermittently elevated, continue to monitor trend for now.  Avoid hypotension  2/7 BP has been elevated, will start low-dose metoprolol 12.5 mg twice daily  -2/12 HR mildly low at times, will DC metoprolol, start lisinopril  2.5 mg    05/02/2023    6:04 AM 05/02/2023    5:03 AM 05/01/2023   10:03 PM  Vitals with BMI  Systolic 149 149 607  Diastolic 66 66 57  Pulse  59     9. RLQ pain:  Felt to be due to IBS. Has been on miralax daily and Senna S 2 tabs nightly without results --Linzess every day was started today. Will get KUB  -04/21/23 KUB with gas filled colon but otherwise neg/nonobstructive; reviewed images myself, suspect stool burden contributory to gas trapped in R side; will increase miralax/colace to BID, leave senna S as is, add sorbitol 30ml PRN if no BM today; has PRN dulcolax supp and fleet if needed too; monitor closely -04/22/23 pt insists she's had BMs daily, but no documentation; no abd pain today, appears it's R groin pain-- see above #2-- tramadol added, leave stool softeners in place, NEED DOCUMENTATION OF STOOLS CT abdomen on 2/4 neg for acute changes, mod stool -2/6 LBM today having more regular bowel movements -2/7-nursing reports patient's pain worsened after her Linzess, family indicates she may have been having pain with this medication previously.  Discussed with GI.  Will discontinue Linzess.  Will check x-ray for stool burden. -04/28/23 xray yesterday unremarkable. No c/o abd pain today. LBM 2 days ago but liquidy; Monitor.  -04/29/23 no abd discomfort today, LBM 2/6 liquidy, monitor for BM overnight, if none then may want to address it further.  -2/10 No abd pain today, LBM 2/6 will order sorbitol 45ml -2/11 patient declined sorbitol, reports bowel movement yesterday.  And also appears she is declining some of her as needed medications.  Continue current regimen and monitor for now -2/12 took PRN sorbitol 30ml yesterday Xray to check stool burdon   10. Hypokalemia: Supplement added X 2 days.  -04/21/23 K 3.3, potassium supplement not ordered, daily x2 days added today -2/10 potassium on the low side at 3.5 today, will order 20 mEq x 1 of potassium -Recheck levels today- 3.4  order 11. Hyperlipidemia: LDL-124 on Lipitor 80 mg and Zetia 10mg  daily.  12. Pre-diabetes: Hgb A1c- 5.9 and every 4 hours CBS have shown BS  controlled. --Will monitor fasting BS on serial labs.  13. Substance abuse: Educated on stroke risk w/marijuana.  14. H/o MM: Acute renal failure improving SCr 1.11 @ admission-->1.59-->1.07  -04/21/23 Cr 1.19, monitor  -2/3 Cr improved to 0.97  Recheck labs, start IVF NS 17ml/hr due to poor intake   2/6 creatinine down to 0.92, DC IVF and encourage oral fluids  2/10 Creatinine stable at 0.85, continue to monitor fluid intake  2/11 continuing to encourage p.o. fluid intake  Recheck labs today 15. GERD: On protonix 40mg  daily 16. Chest pain episode 2/6  -EKG NSR, Trop did not trend up. Pain resolved-suspect this may have been more abdominal pain  17. Oral candidiasis: noted by RN, nystatin QID x7d ordered 18.  Anemia  -2/10 Hgb 11.2 I suspect this may be partially delusional as she was in similar range on 1/28-29.    -Recheck labs today     LOS: 12 days A FACE TO FACE EVALUATION WAS PERFORMED  Fanny Dance 05/02/2023, 10:15 AM

## 2023-05-02 NOTE — Progress Notes (Signed)
PROGRESS NOTE   Subjective/Complaints:  Pt took PRN sorbitol yesterday.   ROS: limited due to aphasia. Denies CP, SOB, n/v, HA  Objective:   No results found.  Recent Labs    04/30/23 0537  WBC 5.1  HGB 11.2*  HCT 34.1*  PLT 209    Recent Labs    04/30/23 0537  NA 142  K 3.5  CL 104  CO2 23  GLUCOSE 93  BUN 7  CREATININE 0.85  CALCIUM 9.4         No intake or output data in the 24 hours ending 05/02/23 0843       Physical Exam: Vital Signs Blood pressure (!) 149/66, pulse (!) 59, temperature 97.6 F (36.4 C), temperature source Oral, resp. rate 16, height 5\' 2"  (1.575 m), weight 68.9 kg, SpO2 100%.  Constitutional: NAD, working with OT picking out her meals HENT:   Atraumatic, normocephalic. Eyes: PERRLA. EOM seem intact. No notable nystagmus.  Cardiovascular: RRR, no murmurs/rub/gallops. No Edema. Peripheral pulses 2+  Respiratory: CTAB. No rales, rhonchi, or wheezing. On RA.  Abdomen: soft, + bowel sounds, nonTTP, nondistended Skin: C/D/I. No apparent lesions. MsK: No joint swelling noted Extremities: R groin with large hematoma in the inguinal canal area, mild TTP in this region   Neuro: Alert awake, answering yes/no questions appropriately more than not, able to say a few other words such as "okay" .Continues to have limited verbal output.  Gesturing for communication sometimes, following simple commands Continued aphasia  expressive more than receptive, dysarthric voice, mild right facial weakness Moving all 4 extremities to gravity and resistance   Assessment/Plan: 1. Functional deficits which require 3+ hours per day of interdisciplinary therapy in a comprehensive inpatient rehab setting. Physiatrist is providing close team supervision and 24 hour management of active medical problems listed below. Physiatrist and rehab team continue to assess barriers to discharge/monitor patient  progress toward functional and medical goals  Care Tool:  Bathing    Body parts bathed by patient: Right arm, Left arm, Chest, Abdomen, Front perineal area, Buttocks, Right upper leg, Left upper leg, Right lower leg, Left lower leg, Face         Bathing assist Assist Level: Contact Guard/Touching assist     Upper Body Dressing/Undressing Upper body dressing   What is the patient wearing?: Hospital gown only    Upper body assist Assist Level: Minimal Assistance - Patient > 75%    Lower Body Dressing/Undressing Lower body dressing      What is the patient wearing?: Underwear/pull up, Pants     Lower body assist Assist for lower body dressing: Minimal Assistance - Patient > 75%     Toileting Toileting    Toileting assist Assist for toileting: Contact Guard/Touching assist     Transfers Chair/bed transfer  Transfers assist     Chair/bed transfer assist level: Supervision/Verbal cueing     Locomotion Ambulation   Ambulation assist      Assist level: Contact Guard/Touching assist Assistive device: No Device Max distance: 172   Walk 10 feet activity   Assist     Assist level: Contact Guard/Touching assist Assistive device: No Device   Walk  50 feet activity   Assist    Assist level: Contact Guard/Touching assist Assistive device: No Device    Walk 150 feet activity   Assist Walk 150 feet activity did not occur: Safety/medical concerns  Assist level: Contact Guard/Touching assist Assistive device: No Device    Walk 10 feet on uneven surface  activity   Assist Walk 10 feet on uneven surfaces activity did not occur: Safety/medical concerns         Wheelchair     Assist Is the patient using a wheelchair?: Yes Type of Wheelchair: Manual    Wheelchair assist level: Dependent - Patient 0% Max wheelchair distance: 250    Wheelchair 50 feet with 2 turns activity    Assist        Assist Level: Dependent - Patient 0%    Wheelchair 150 feet activity     Assist      Assist Level: Dependent - Patient 0%   Blood pressure (!) 149/66, pulse (!) 59, temperature 97.6 F (36.4 C), temperature source Oral, resp. rate 16, height 5\' 2"  (1.575 m), weight 68.9 kg, SpO2 100%.  Medical Problem List and Plan: 1. 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 Formulized individual treatment plan based on medical history, underlying problem and comorbidities.   2.  R-CFV DVT (found 1/31)/Antithrombotics: -DVT/anticoagulation:  Pharmaceutical: Heparin low dose X 48 hours and if stable transition to DOAC              -antiplatelet therapy: Brillinta. ASA d/c due to addition of Heparin.  - Per lit review, early mobilization current recommendation, without gross restrictions; no convincing evidence of increased PE risk.   -04/22/23 long discussion with pt and brother regarding clot location, this coincides with area of pain, large palpable ?hematoma to R groin; had mechanical thrombectomy-- called IR Dr. Richarda Overlie who relayed to the team, Anders Grant NP will come see the patient, appreciate their assistance with this case. For now, added Tramadol 100mg  q8h PRN to help with pain.  -04/23/23 Vas U/S with hematoma/continued DVT-smaller, Pt seen by IR this AM - recommend avoiding bending or stooping, limited mobility- transfers only for 1-2 days.  Ok to  transition to eliquis  -2/6 Dr. Corliss Skains recommends 1 more day of light activity than full activity Friday -2/7 OK for full activity today, neurology adjusting Brilinta dose to 45 mg BID given results of P2y12, she is also on Eliquis 5 mg twice daily   3. Pain Management:  Tylenol prn. Gabapentin 300mg  TID -04/22/23 added Tramadol 100mg  q8h PRN for pain in R groin (see above)-- pt allergic/intolerant to other hydro/oxycodone.  -2/4 scheduled Tylenol -2/7 pain controlled overall, tramadol as needed was restarted over the weekend -2/12 Intermittent use of  Tramadol PRN, continue current regimen 4. Mood/Behavior/Sleep: LCSW to follow for evaluation and support.              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 Neuropsych/cognition: This patient is not capable of making decisions on her own behalf. 6. Skin/Wound Care: Routine pressure relief measures.  7. Fluids/Electrolytes/Nutrition: Monitor I/O. Check routine labs -04/21/23 Cr 1.19 a little up from yesterday but down from prior, monitor; K 3.3, see below; remainder of CMP unremarkable, monitor labs 8. HTN: Monitor BP TID--continue amlodipine 10mg  daily and hydralazine 50mg  q8h             --avoid hypoperfusion.   -2/4 intermittently elevated, continue to monitor trend for now.  Avoid hypotension  2/7 BP has been elevated, will start low-dose metoprolol 12.5 mg twice daily  -2/12 HR mildly low at times, will DC metoprolol, start lisinopril 2.5 mg    05/02/2023    6:04 AM 05/02/2023    5:03 AM 05/01/2023   10:03 PM  Vitals with BMI  Systolic 149 149 119  Diastolic 66 66 57  Pulse  59     9. RLQ pain:  Felt to be due to  IBS. Has been on miralax daily and Senna S 2 tabs nightly without results --Linzess every day was started today. Will get KUB  -04/21/23 KUB with gas filled colon but otherwise neg/nonobstructive; reviewed images myself, suspect stool burden contributory to gas trapped in R side; will increase miralax/colace to BID, leave senna S as is, add sorbitol 30ml PRN if no BM today; has PRN dulcolax supp and fleet if needed too; monitor closely -04/22/23 pt insists she's had BMs daily, but no documentation; no abd pain today, appears it's R groin pain-- see above #2-- tramadol added, leave stool softeners in place, NEED DOCUMENTATION OF STOOLS CT abdomen on 2/4 neg for acute changes, mod stool -2/6 LBM today having more regular bowel movements -2/7-nursing reports patient's pain worsened after her Linzess, family indicates she may have been having pain with this medication previously.  Discussed with GI.  Will discontinue Linzess.  Will check x-ray for stool burden. -04/28/23 xray yesterday unremarkable. No c/o abd pain today. LBM 2 days ago but liquidy; Monitor.  -04/29/23 no abd discomfort today, LBM 2/6 liquidy, monitor for BM overnight, if none then may want to address it further.  -2/10 No abd pain today, LBM 2/6 will order sorbitol 45ml -2/11 patient declined sorbitol, reports bowel movement yesterday.  And also appears she is declining some of her as needed medications.  Continue current regimen and monitor for now -2/12 took PRN sorbitol 30ml yesterday   10. Hypokalemia: Supplement added X 2 days.  -04/21/23 K 3.3, potassium supplement not ordered, daily x2 days added today -2/10 potassium on the low side at 3.5 today, will order 20 mEq x 1 of potassium -Recheck levels Thursday 11. Hyperlipidemia: LDL-124 on Lipitor 80 mg and Zetia 10mg  daily.  12. Pre-diabetes: Hgb A1c- 5.9 and every 4 hours CBS have shown BS controlled. --Will monitor fasting BS on serial labs.  13. Substance abuse: Educated  on stroke risk w/marijuana.  14. H/o MM: Acute renal failure improving SCr 1.11 @ admission-->1.59-->1.07  -04/21/23 Cr 1.19,  monitor  -2/3 Cr improved to 0.97  Recheck labs, start IVF NS 34ml/hr due to poor intake   2/6 creatinine down to 0.92, DC IVF and encourage oral fluids  2/10 Creatinine stable at 0.85, continue to monitor fluid intake  2/11 continuing to encourage p.o. fluid intake 15. GERD: On protonix 40mg  daily 16. Chest pain episode 2/6  -EKG NSR, Trop did not trend up. Pain resolved-suspect this may have been more abdominal pain  17. Oral candidiasis: noted by RN, nystatin QID x7d ordered 18.  Anemia  -2/10 Hgb 11.2 I suspect this may be partially delusional as she was in similar range on 1/28-29.    -Recheck labs Thursday     LOS: 12 days A FACE TO FACE EVALUATION WAS PERFORMED  Fanny Dance 05/02/2023, 8:43 AM

## 2023-05-02 NOTE — Progress Notes (Signed)
Pt refused magnesium citrate at 2132. Last recorded BM was on 2/6.

## 2023-05-02 NOTE — Progress Notes (Signed)
Speech Language Pathology Discharge Summary  Patient Details  Name: Brittany [name] MRN: [id number] Date of Birth: [birth year]  Date of Discharge from SLP service:May 02, 2023  Today's Date: 05/02/2023 SLP Individual Time: 4098-1191 SLP Individual Time Calculation (min): 60 min   Skilled Therapeutic Interventions:   Pt greeted at bedside. She was asleep upon SLP arrival, though woke easily with verbal cues. During initial conversation, pt was able to answer Y/N questions re biographical info and upcoming d/c via head nod/grunting w/ 90% accuracy. She then benefited from [facility] verbal/visual cues overall to follow simple one step directions. During verbal expression tasks, she benefited from maxA visual/verbal cues to complete automatic phrases,    Patient has met 3 of 3 long term goals.  Patient to discharge at overall Max level.  Reasons goals not met: n/a   Clinical Impression/Discharge Summary:  Good progress noted this stay, as pt met 3/3 ST goals. 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 However, given significant inconsistencies in errors as of late and overall pt behavior, anticipate that her current communication deficits may be c/w malingering. However, she would benefit from continued ST at this time to further address exoressive/receptive language goals and facilitate increased pt independence.    Care Partner:  Caregiver Able to Provide Assistance: Yes  Type of Caregiver Assistance: Cognitive  Recommendation:  Outpatient SLP;24 hour supervision/assistance  Rationale for SLP Follow Up: Maximize functional communication;Reduce caregiver burden   Equipment: n/a   Reasons for discharge: Discharged from hospital   Patient/Family Agrees with Progress Made and Goals Achieved: Yes    Pati Gallo 05/02/2023, 3:24 PM

## 2023-05-02 NOTE — Plan of Care (Signed)
  Problem: RH BOWEL ELIMINATION Goal: RH STG MANAGE BOWEL WITH ASSISTANCE Description: STG Manage Bowel with  MOD I Assistance. Outcome: Progressing   Problem: RH BOWEL ELIMINATION Goal: RH STG MANAGE BOWEL W/MEDICATION W/ASSISTANCE Description: STG Manage Bowel with Medication with Assistance. Outcome: Progressing   Problem: RH SAFETY Goal: RH STG ADHERE TO SAFETY PRECAUTIONS W/ASSISTANCE/DEVICE Description: STG Adhere to Safety Precautions With Assistance/Device. Outcome: Progressing

## 2023-05-02 NOTE — Progress Notes (Signed)
Physical Therapy Discharge Summary  Patient Details  Name: Brittany [name] MRN: [id number] Date of Birth: 04-28-1965  Date of Discharge from PT service:May 02, 2023  Today's Date: 05/02/2023 PT Individual Time: 8295-6213 PT Individual Time Calculation (min): 40 min    Patient has met 9 of 9 long term goals due to improved activity tolerance, improved balance, increased strength, decreased pain, ability to compensate for deficits, improved attention, improved awareness, and improved coordination.  Patient to discharge at an ambulatory level Supervision. Patient's care partner is independent to provide the necessary cognitive assistance at discharge.    Recommendation:  Patient will benefit from ongoing skilled PT services in home health setting to continue to advance safe functional mobility, address ongoing impairments in endurance, balance, strength, coordination, and minimize fall risk.  Equipment: Rollator   Reasons for discharge: treatment goals met and discharge from hospital  Patient/family agrees with progress made and goals achieved: Yes  PT Discharge Precautions/Restrictions Precautions Precautions: Fall Precaution/Restrictions Comments: R-hemi, R-inattention, watch BP, global aphasia, RLE DVT Restrictions Weight Bearing Restrictions Per Provider Order: No Pain Interference Pain Interference Pain Effect on Sleep: 2. Occasionally Pain Interference with Therapy Activities: 1. Rarely or not at all Pain Interference with Day-to-Day Activities: 1. Rarely or not at all Vision/Perception  Vision - History Ability to See in Adequate Light: 1 Impaired Vision - Assessment Eye Alignment: Within Functional Limits Ocular Range of Motion: Within Functional Limits Alignment/Gaze Preference: Within Defined Limits Tracking/Visual Pursuits: Able to track stimulus in all quads without difficulty Perception Perception: Impaired Preception Impairment Details:  Inattention/Neglect Praxis Praxis: WFL  Cognition Overall Cognitive Status: Difficult to assess (2/2 global aphasia) Arousal/Alertness: Awake/alert Orientation Level: Oriented to person;Oriented to place Awareness: Impaired Awareness Impairment: Intellectual impairment Problem Solving: Impaired Behaviors: Restless;Poor frustration tolerance Safety/Judgment: Impaired Comments: Slight impulsivity during mobility. Sensation Sensation Light Touch: Impaired Detail Peripheral sensation comments: History of peripheral nueropathy Light Touch Impaired Details: Impaired RLE;Impaired LLE Hot/Cold: Appears Intact Proprioception: Impaired by gross assessment Stereognosis: Impaired by gross assessment Coordination Gross Motor Movements are Fluid and Coordinated: No Fine Motor Movements are Fluid and Coordinated: No Coordination and Movement Description: Deficits due to mild R-sided hemiparesis and mild R-sided inattention. Motor  Motor Motor: Other (comment) Motor - Skilled Clinical Observations: Deficits due to R-sided hemiparesis and mild R-sided inattention.  Mobility Bed Mobility Bed Mobility: Rolling Right;Rolling Left;Supine to Sit;Sit to Supine Rolling Right: Independent Rolling Left: Independent Supine to Sit: Independent Sit to Supine: Independent Transfers Transfers: Sit to Stand;Stand to Sit;Stand Pivot Transfers Sit to Stand: Supervision/Verbal cueing Stand to Sit: Supervision/Verbal cueing Stand Pivot Transfers: Supervision/Verbal cueing Stand Pivot Transfer Details: Verbal cues for precautions/safety Transfer (Assistive device): None Locomotion  Gait Ambulation: Yes Gait Assistance: Supervision/Verbal cueing Gait Distance (Feet): 150 Feet Assistive device: Rollator Gait Assistance Details: Verbal cues for precautions/safety Gait Gait: Yes Gait Pattern: Impaired Gait Pattern: Decreased stance time - right;Decreased step length - right;Decreased weight shift to  right Gait velocity: decreased Stairs / Additional Locomotion Stairs: Yes Stairs Assistance: Supervision/Verbal cueing Stair Management Technique: Two rails;Alternating pattern;Step to pattern;Forwards Number of Stairs: 12 Height of Stairs: 6 Ramp: Supervision/Verbal cueing Wheelchair Mobility Wheelchair Mobility: No  Trunk/Postural Assessment  Cervical Assessment Cervical Assessment: Exceptions to [facility] (forward head) Thoracic Assessment Thoracic Assessment: Exceptions to [facility] (rounded shoulders) Lumbar Assessment Lumbar Assessment: Exceptions to [facility] (posterior pelvic tilt) Postural Control Postural Control: Deficits on evaluation Righting Reactions: Delayed Protective Responses: Delayed  Balance Balance Balance Assessed: Yes Static Sitting Balance Static Sitting - Balance  Support: Feet supported Static Sitting - Level of Assistance: 7: Independent Dynamic Sitting Balance Dynamic Sitting - Balance Support: During functional activity;Feet supported Dynamic Sitting - Level of Assistance: 5: Stand by assistance (supervision) Static Standing Balance Static Standing - Balance Support: During functional activity;No upper extremity supported Static Standing - Level of Assistance: 5: Stand by assistance (supervision) Dynamic Standing Balance Dynamic Standing - Balance Support: During functional activity;No upper extremity supported Dynamic Standing - Level of Assistance: 5: Stand by assistance (supervision) Dynamic Standing - Balance Activities: Lateral lean/weight shifting;Forward lean/weight shifting;Reaching for objects Extremity Assessment  RUE Assessment RUE Assessment: Exceptions to [facility] Active Range of Motion (AROM) Comments: WFL for shoulder flexion & ~110 degrees of shoulder abduction General Strength Comments: Proximal strength: WFL; Dysmetria RUE Body System: Neuro Brunstrum levels for arm and hand: Arm;Hand Brunstrum level for arm: Stage V Relative Independence from  [employer] Brunstrum level for hand: Stage VI Isolated joint movements   RLE Assessment RLE Assessment: Exceptions to [facility] RLE Strength Right Hip Flexion: 3/5 Right Hip Extension: 3+/5 Right Hip ABduction: 4/5 Right Knee Flexion: 3/5 Right Knee Extension: 4/5 Right Ankle Dorsiflexion: 3-/5 Right Ankle Plantar Flexion: 3/5 LLE Assessment LLE Assessment: Within Functional Limits  Today's interventions  Pt asleep upon arrival. Pt awoken and agreeable to therapy. Pt overall appears more sluggish and in pain today, pt pointing at abdomen and R groin area, premedicated. Notified nurse and MD during morning rounds. Therapist assessed groin region, no changes.   Therapist performed discharge assessement, see above   Reviewed and performed HEP. Handout provided.   Access Code: [email or web address] URL: [email or web address] Date: 05/02/2023 Prepared by: Ambrose Finland  Exercises - Standing Hip Abduction with Counter Support  - 1 x daily - 7 x weekly - 3 sets - 10 reps - Standing March with Counter Support  - 1 x daily - 7 x weekly - 3 sets - 10 reps - Standing Hip Extension with Counter Support  - 1 x daily - 7 x weekly - 3 sets - 10 reps  Pt having gas throughout session, encouraged pt to toilet during session. Pt continent of bladder but unable to have BM. MD aware.   Pt supine in bed at end of session with all needs within reach and bed alarm on.    [facility], [place], DPT  05/02/2023, 9:14 AM

## 2023-05-02 NOTE — Plan of Care (Signed)
Problem: RH Comprehension Communication Goal: LTG Patient will comprehend basic/complex auditory (SLP) Description: LTG: Patient will comprehend basic/complex auditory information with cues (SLP). Outcome: Completed/Met   Problem: RH Expression Communication Goal: LTG Patient will express needs/wants via multi-modal(SLP) Description: LTG:  Patient will express needs/wants via multi-modal communication (gestures/written, etc) with cues (SLP) Outcome: Completed/Met Goal: LTG Patient will verbally express basic/complex needs(SLP) Description: LTG:  Patient will verbally express basic/complex needs, wants or ideas with cues  (SLP) Outcome: Completed/Met

## 2023-05-02 NOTE — Progress Notes (Signed)
Patient ID: Brittany [name], female   DOB: 1965/07/20, 58 y.o.   MRN: [id number]  Met with pt and parents aware of team conference update and plan for discharge tomorrow. Equipment in room and home health arranged along with her PCS resumption of care.

## 2023-05-02 NOTE — Plan of Care (Signed)
?  Problem: RH Balance ?Goal: LTG Patient will maintain dynamic standing with ADLs (OT) ?Description: LTG:  Patient will maintain dynamic standing balance with assist during activities of daily living (OT)  ?Outcome: Completed/Met ?  ?Problem: RH Bathing ?Goal: LTG Patient will bathe all body parts with assist levels (OT) ?Description: LTG: Patient will bathe all body parts with assist levels (OT) ?Outcome: Completed/Met ?  ?Problem: RH Dressing ?Goal: LTG Patient will perform upper body dressing (OT) ?Description: LTG Patient will perform upper body dressing with assist, with/without cues (OT). ?Outcome: Completed/Met ?Goal: LTG Patient will perform lower body dressing w/assist (OT) ?Description: LTG: Patient will perform lower body dressing with assist, with/without cues in positioning using equipment (OT) ?Outcome: Completed/Met ?  ?Problem: RH Toileting ?Goal: LTG Patient will perform toileting task (3/3 steps) with assistance level (OT) ?Description: LTG: Patient will perform toileting task (3/3 steps) with assistance level (OT)  ?Outcome: Completed/Met ?  ?Problem: RH Functional Use of Upper Extremity ?Goal: LTG Patient will use RT/LT upper extremity as a (OT) ?Description: LTG: Patient will use right/left upper extremity as a stabilizer/gross assist/diminished/nondominant/dominant level with assist, with/without cues during functional activity (OT) ?Outcome: Completed/Met ?  ?Problem: RH Toilet Transfers ?Goal: LTG Patient will perform toilet transfers w/assist (OT) ?Description: LTG: Patient will perform toilet transfers with assist, with/without cues using equipment (OT) ?Outcome: Completed/Met ?  ?Problem: RH Tub/Shower Transfers ?Goal: LTG Patient will perform tub/shower transfers w/assist (OT) ?Description: LTG: Patient will perform tub/shower transfers with assist, with/without cues using equipment (OT) ?Outcome: Completed/Met ?  ?

## 2023-05-03 ENCOUNTER — Other Ambulatory Visit (HOSPITAL_COMMUNITY): Payer: Self-pay

## 2023-05-03 ENCOUNTER — Other Ambulatory Visit (HOSPITAL_COMMUNITY): Payer: Self-pay | Admitting: Interventional Radiology

## 2023-05-03 DIAGNOSIS — I771 Stricture of artery: Secondary | ICD-10-CM

## 2023-05-03 DIAGNOSIS — K589 Irritable bowel syndrome without diarrhea: Secondary | ICD-10-CM | POA: Diagnosis not present

## 2023-05-03 DIAGNOSIS — I10 Essential (primary) hypertension: Secondary | ICD-10-CM | POA: Diagnosis not present

## 2023-05-03 DIAGNOSIS — I63512 Cerebral infarction due to unspecified occlusion or stenosis of left middle cerebral artery: Secondary | ICD-10-CM | POA: Diagnosis not present

## 2023-05-03 DIAGNOSIS — E876 Hypokalemia: Secondary | ICD-10-CM | POA: Diagnosis not present

## 2023-05-03 DIAGNOSIS — D649 Anemia, unspecified: Secondary | ICD-10-CM | POA: Diagnosis not present

## 2023-05-03 DIAGNOSIS — N179 Acute kidney failure, unspecified: Secondary | ICD-10-CM | POA: Diagnosis not present

## 2023-05-03 MED ORDER — TRAMADOL HCL 50 MG PO TABS
50.0000 mg | ORAL_TABLET | Freq: Four times a day (QID) | ORAL | 0 refills | Status: DC | PRN
  Filled 2023-05-03: qty 50, 7d supply, fill #0

## 2023-05-03 MED ORDER — TRAZODONE HCL 50 MG PO TABS
25.0000 mg | ORAL_TABLET | Freq: Every day | ORAL | 0 refills | Status: DC
  Filled 2023-05-03: qty 30, 60d supply, fill #0

## 2023-05-03 MED ORDER — ATORVASTATIN CALCIUM 80 MG PO TABS
80.0000 mg | ORAL_TABLET | Freq: Every day | ORAL | 0 refills | Status: DC
  Filled 2023-05-03: qty 30, 30d supply, fill #0

## 2023-05-03 MED ORDER — EZETIMIBE 10 MG PO TABS
10.0000 mg | ORAL_TABLET | Freq: Every day | ORAL | 0 refills | Status: DC
  Filled 2023-05-03: qty 30, 30d supply, fill #0

## 2023-05-03 MED ORDER — POTASSIUM CHLORIDE 20 MEQ PO PACK
20.0000 meq | PACK | Freq: Two times a day (BID) | ORAL | 0 refills | Status: DC
  Filled 2023-05-03: qty 60, 30d supply, fill #0

## 2023-05-03 MED ORDER — TICAGRELOR 90 MG PO TABS
45.0000 mg | ORAL_TABLET | Freq: Two times a day (BID) | ORAL | 0 refills | Status: DC
  Filled 2023-05-03: qty 60, 60d supply, fill #0

## 2023-05-03 MED ORDER — GABAPENTIN 300 MG PO CAPS
300.0000 mg | ORAL_CAPSULE | Freq: Three times a day (TID) | ORAL | 0 refills | Status: DC
  Filled 2023-05-03: qty 90, 30d supply, fill #0

## 2023-05-03 MED ORDER — LISINOPRIL 2.5 MG PO TABS
2.5000 mg | ORAL_TABLET | Freq: Every day | ORAL | 0 refills | Status: DC
  Filled 2023-05-03: qty 30, 30d supply, fill #0

## 2023-05-03 MED ORDER — POLYETHYLENE GLYCOL 3350 17 GM/SCOOP PO POWD
17.0000 g | Freq: Two times a day (BID) | ORAL | 0 refills | Status: DC
  Filled 2023-05-03: qty 238, 7d supply, fill #0

## 2023-05-03 MED ORDER — DOCUSATE SODIUM 100 MG PO CAPS
100.0000 mg | ORAL_CAPSULE | Freq: Two times a day (BID) | ORAL | 0 refills | Status: AC
  Filled 2023-05-03: qty 60, 30d supply, fill #0

## 2023-05-03 MED ORDER — SENNOSIDES-DOCUSATE SODIUM 8.6-50 MG PO TABS
2.0000 | ORAL_TABLET | Freq: Every day | ORAL | 0 refills | Status: DC
  Filled 2023-05-03: qty 60, 30d supply, fill #0

## 2023-05-03 MED ORDER — TICAGRELOR 90 MG PO TABS
90.0000 mg | ORAL_TABLET | Freq: Two times a day (BID) | ORAL | 0 refills | Status: DC
  Filled 2023-05-03: qty 60, 30d supply, fill #0

## 2023-05-03 MED ORDER — AMLODIPINE BESYLATE 10 MG PO TABS
10.0000 mg | ORAL_TABLET | Freq: Every day | ORAL | 0 refills | Status: DC
  Filled 2023-05-03: qty 30, 30d supply, fill #0

## 2023-05-03 MED ORDER — APIXABAN 5 MG PO TABS
5.0000 mg | ORAL_TABLET | Freq: Two times a day (BID) | ORAL | 0 refills | Status: DC
Start: 2023-05-03 — End: 2023-05-25
  Filled 2023-05-03: qty 60, 30d supply, fill #0

## 2023-05-03 MED ORDER — DICYCLOMINE HCL 10 MG PO CAPS
10.0000 mg | ORAL_CAPSULE | Freq: Three times a day (TID) | ORAL | 0 refills | Status: DC
  Filled 2023-05-03: qty 90, 30d supply, fill #0

## 2023-05-03 MED ORDER — PANTOPRAZOLE SODIUM 40 MG PO TBEC
40.0000 mg | DELAYED_RELEASE_TABLET | Freq: Every day | ORAL | 0 refills | Status: DC
  Filled 2023-05-03: qty 30, 30d supply, fill #0

## 2023-05-03 NOTE — Progress Notes (Signed)
Inpatient Rehabilitation Discharge Medication Review by a Pharmacist  A complete drug regimen review was completed for this patient to identify any potential clinically significant medication issues.   High Risk Drug Classes Is patient taking? Indication by Medication  Antipsychotic No   Anticoagulant Yes Apixaban -RLE DVT  Antibiotic No    Opioid Yes  Tramadol- pain  Antiplatelet Yes Brilinta - CVA/ left  MCA M1 stent  Hypoglycemics/insulin No    Vasoactive Medication Yes Amlodipine/hydralazine, lisinopril- HTN  Chemotherapy No    Other Yes Atorvastatin, zetia - HLD Dicyclomine- GI spasms Vit D - supplement Neurontin - neuropathy Senokot-S, miralax, colace - laxative/stool softener Protonix - GERD Trazodone - sleep       Type of Medication Issue Identified Description of Issue Recommendation(s)  Drug Interaction(s) (clinically significant)        Duplicate Therapy        Allergy        No Medication Administration End Date        Incorrect Dose        Additional Drug Therapy Needed      Significant med changes from prior encounter (inform family/care partners about these prior to discharge).  New medication: Brilinta dose reduced from 90mg  BID initially down to 45 mg BID per  Neuologist.  I discussed with Delle Reining, PA. Sje will adjust discharge Brilinta dose to 45 mg BID.   Other             Clinically significant medication issues were identified that warrant physician communication and completion of prescribed/recommended actions by midnight of the next day:  No  Name of provider notified for urgent issues identified:   Provider Method of Notification:    Pharmacist comments:   Time spent performing this drug regimen review (minutes): 40    Noah Delaine, [place] Clinical Pharmacist 05/03/2023 12:33 PM

## 2023-05-03 NOTE — Patient Care Conference (Signed)
Inpatient RehabilitationTeam Conference and Plan of Care Update Date: 05/02/2023   Time: 1213 pm    Patient Name: Brittany [name]      Medical Record Number: [id number]  Date of Birth: Jul 28, 1965 Sex: Female         Room/Bed: 4W07C/4W07C-01 Payor Info: Payor: [place] MEDICAID [employer]PAID [employer] PLAN / Plan: [place] MEDICAID [employer] / Product Type: *No Product type* /    Admit Date/Time:  04/20/2023  6:09 PM  Primary Diagnosis:  Acute ischemic left middle cerebral artery (MCA) stroke [facility])  Hospital Problems: Principal Problem:   Acute ischemic left middle cerebral artery (MCA) stroke (HCC) Active Problems:   Acute deep vein thrombosis (DVT) of femoral vein of right lower extremity (HCC)   Coping style affecting medical condition    Expected Discharge Date: Expected Discharge Date: 05/03/23  Team Members Present: Physician leading conference: Dr. Fanny Dance Social Worker Present: Dossie Der, LCSW Nurse Present: Konrad Dolores, RN PT Present: Ambrose Finland, PT OT Present: Lou Cal, OT SLP Present: Jeannie Done, SLP PPS Coordinator present : Fae Pippin, SLP     Current Status/Progress Goal Weekly Team Focus  Bowel/Bladder   Pt currently continent B/B    LBM 04/26/23 oer flow shhet.   Will remain continent with B/B pattern   Assist pt with toileting needs qshift/prn    Swallow/Nutrition/ Hydration   Dys3/thin liquids, bolus residue requiring min verbal cues to clear   supervision  ongoing diet tolerance    ADL's   Supervision for ADLs, toilet and walk-in shower transfer   Supervision   Discharge    Mobility   at goal level   supervision; goals downgraded for safety 2/2 cognitive deficits  family training completed 05/01/23. HEP provided. Pt has rollator.    Communication   severe receptive/expressive language deficits, limited yes/no accuracy but more functional for environmental/basic questions, utilizes gestures to communicate  somewhat effectively, utilizes communication board intermittently with limited success, can only write if copying, cannot write words spontaneously   max assist for receptive and expressive language   increasing yes/no accuracy, following commands, multimodal communication of wants/needs    Safety/Cognition/ Behavioral Observations               Pain   Facial grimacing/guarding to right groin. Pain being managed with scheduled Tylenol/Tramadol   Will be free from pain   Assess pt for pain and and administer prn pain medication per provider's order    Skin   Skin is currently intact   Will maintain skin intergrity with no breakdown  Assess skin for breakdown qshift/prn and provide education to prevent skin breakdown      Discharge Planning:  Parents here yesterday for education comfortable with daughter's care needs. DME delivered to room and home health set up. PCS aide will resume upon DC.   Team Discussion: Patient was admitted post Left MCA infarct with severe left MCA stenosis. Patient limited by abdominal and  right groin pain; medications adjusted. Patient on target to meet rehab goals: Yes, Overall, patient met her functional goals  *See Care Plan and progress notes for long and short-term goals.   Revisions to Treatment Plan:  N/a   Teaching Needs: Medications, toileting, safety,transfers, dietary modifications, etc.  Current Barriers to Discharge: Decrease care giver support  Possible Resolutions to Barriers: Family education DME:TTB; [facility] Home health follow up     Medical Summary Current Status: L MCA infarct, DVT, hematoma, htn, hypokalemia, constipation, anemia  Barriers to Discharge: Behavior/Mood;Medical stability;Uncontrolled  Pain  Barriers to Discharge Comments: L MCA infarct, DVT, hematoma, htn, hypokalemia, constipation, anemia Possible Resolutions to [employer] Focus: adjust tramadol, check abd xray, replete K+   Continued Need for Acute  Rehabilitation Level of Care: The patient requires daily medical management by a physician with specialized training in physical medicine and rehabilitation for the following reasons: Direction of a multidisciplinary physical rehabilitation program to maximize functional independence : Yes Medical management of patient stability for increased activity during participation in an intensive rehabilitation regime.: Yes Analysis of laboratory values and/or radiology reports with any subsequent need for medication adjustment and/or medical intervention. : Yes   I attest that I was present, lead the team conference, and concur with the assessment and plan of the team.   Gwenyth Allegra 05/03/2023, 9:50 AM

## 2023-05-03 NOTE — Discharge Summary (Signed)
Physician Discharge Summary  Patient ID: Brittany [name] MRN: [id number] DOB/AGE: 58-Jun-1967 58 y.o.  Admit date: 04/20/2023 Discharge date: 05/03/2023  Discharge Diagnoses:  Principal Problem:   Acute ischemic left middle cerebral artery (MCA) stroke (HCC) Active Problems:   Acute deep vein thrombosis (DVT) of femoral vein of right lower extremity (HCC)   Coping style affecting medical condition  Right groin pain  Constipation  High blood pressure  Oral candidiasis    Anemia   Discharged Condition: stable  Significant Diagnostic Studies: DG Abd 2 Views Result Date: 05/02/2023 CLINICAL DATA:  Constipation.  Evaluate for stool burden. EXAM: ABDOMEN - 2 VIEW COMPARISON:  Abdominal radiograph dated 04/27/2023 FINDINGS: Moderate amount of stool throughout the colon. No bowel dilatation or evidence of obstruction. No free air or radiopaque calculi. The osseous structures are intact. The soft tissues are unremarkable IMPRESSION: Moderate colonic stool burden. No bowel obstruction. Electronically Signed   By: Elgie Collard M.D.   On: 05/02/2023 15:31   DG Abd 2 Views Result Date: 04/27/2023 CLINICAL DATA:  Severe abdominal pain EXAM: ABDOMEN - 2 VIEW COMPARISON:  04/20/2023 FINDINGS: There [name] normal bowel gas pattern. No free air. No organomegaly or suspicious calcification. No acute bony abnormality. IMPRESSION: Negative. Electronically Signed   By: Charlett Nose M.D.   On: 04/27/2023 19:24   CT HEAD WO CONTRAST ( ) Result Date: 04/25/2023 CLINICAL DATA:  Stroke, follow up EXAM: CT HEAD WITHOUT CONTRAST TECHNIQUE: Contiguous axial images were obtained from the base of the skull through the vertex without intravenous contrast. RADIATION DOSE REDUCTION: This exam was performed according to the departmental dose-optimization program which includes automated exposure control, adjustment of the mA and/or kV according to patient size and/or use of iterative reconstruction technique.  COMPARISON:  CT head April 24, 2023. FINDINGS: Brain: Stable hemorrhagic infarct in the left MCA territory. No progressive mass effect or progressive hemorrhage. No hydrocephalus. No visible mass lesion. Vascular: Left M1 stent. Skull: No acute fracture. Sinuses/Orbits: Clear sinuses.  No acute orbital findings. Other: No mastoid effusions. IMPRESSION: Stable hemorrhagic infarct in the left MCA territory. No progressive mass effect or progressive hemorrhage. Electronically Signed   By: Feliberto Harts M.D.   On: 04/25/2023 14:51   CT ABDOMEN PELVIS WO CONTRAST Result Date: 04/24/2023 CLINICAL DATA:  Abdominal pain, acute, nonlocalized. EXAM: CT ABDOMEN AND PELVIS WITHOUT CONTRAST TECHNIQUE: Multidetector CT imaging of the abdomen and pelvis was performed following the standard protocol without IV contrast. RADIATION DOSE REDUCTION: This exam was performed according to the departmental dose-optimization program which includes automated exposure control, adjustment of the mA and/or kV according to patient size and/or use of iterative reconstruction technique. COMPARISON:  Abdominal radiography 04/20/2023.  CT 05/30/2018. FINDINGS: Lower chest: Mild atelectasis in the right middle lobe. No pleural effusion. Hepatobiliary: Liver parenchyma appears normal without contrast. No calcified gallstones. Pancreas: Normal Spleen: Normal Adrenals/Urinary Tract: Adrenal glands are normal. Right kidney [name] normal. Left kidney [name] normal except for a simple appearing cyst, maximal dimension 5.7 cm, enlarged since 2020. No hydronephrosis. Bladder [name] normal. Stomach/Bowel: Stomach and small intestine are normal. Appendix [name] normal. Moderate amount of stool and fluid in the colon without evidence of frank obstruction. No finding to suggest colitis or diverticulitis. Vascular/Lymphatic: Aortic atherosclerosis. No aneurysm. IVC [name] normal. No adenopathy. Edematous stranding in the right groin, presumably subsequent to the previous  vascular access. No evidence of well-defined focal collection. Reproductive: No pelvic mass. Other: No free fluid or air. Musculoskeletal: Ordinary mild lumbar degenerative  changes. IMPRESSION: 1. No acute finding by CT. Moderate amount of stool and fluid in the colon without evidence of frank obstruction. No finding to suggest colitis or diverticulitis. 2. Aortic atherosclerosis. 3. Edematous stranding in the right groin, presumably subsequent to the previous vascular access. No evidence of well-defined focal collection. Electronically Signed   By: Paulina Fusi M.D.   On: 04/24/2023 15:44   CT HEAD WO CONTRAST ( ) Result Date: 04/24/2023 CLINICAL DATA:  Stroke.  Follow-up. EXAM: CT HEAD WITHOUT CONTRAST TECHNIQUE: Contiguous axial images were obtained from the base of the skull through the vertex without intravenous contrast. RADIATION DOSE REDUCTION: This exam was performed according to the departmental dose-optimization program which includes automated exposure control, adjustment of the mA and/or kV according to patient size and/or use of iterative reconstruction technique. COMPARISON:  CT 04/16/2023 FINDINGS: Brain: No focal abnormality affects the brainstem. Old infarctions noted within the inferior cerebellum on the left. Previously placed left MCA vascular stent. Previously seen hemorrhagic infarction in the left insula, lateral basal ganglia and frontal operculum with expected regional low density. Central blood products remain visible and actually appear slightly more dense and better circumscribed, suggesting that there could be some additional or ongoing hemorrhage. Alternatively, this could be due to clot contraction. Previously, the hemorrhage was becoming less dense and less distinct. Other scattered acute infarctions in the left MCA territory continue to show low-density but no hemorrhage in those locations. No evidence of a new regional stroke. Mild mass effect but no midline shift. No subdural  or subarachnoid bleeding. Vascular: No other vascular finding. Skull: Negative Sinuses/Orbits: Clear/normal Other: None IMPRESSION: 1. Previously seen hemorrhagic infarction in the left insula, lateral basal ganglia and frontal operculum with expected regional low density. Central blood products remain visible and actually appear slightly more dense and better circumscribed, suggesting that there could be some additional or ongoing hemorrhage. Previously, the hemorrhage was becoming less dense and less distinct. However, an additional explanation could be that of clot contraction. 2. Other scattered acute infarctions in the left MCA territory continue to show low-density but no hemorrhage in those locations. 3. No evidence of a new regional stroke. Electronically Signed   By: Paulina Fusi M.D.   On: 04/24/2023 15:41   VAS [place] GROIN PSEUDOANEURYSM Result Date: 04/23/2023  ARTERIAL PSEUDOANEURYSM  Patient Name:  Brittany [name]  Date of Exam:   04/22/2023 Medical Rec #: [id number]              Accession #:    [id number] Date of Birth: 1965/04/12              Patient Gender: F Patient Age:   46 years Exam Location:  [facility] Procedure:      VAS [place] Bobetta Lime Referring Phys: Anders Grant --------------------------------------------------------------------------------  Exam: Right groin Indications: Patient complains of groin pain and bruising. History: S/p arteriogram 04/16/23. Limitations: Edema, pain with pressure Comparison Study: No prior study Performing Technologist: Sherren Kerns RVS  Examination Guidelines: A complete evaluation includes B-mode imaging, spectral Doppler, color Doppler, and power Doppler as needed of all accessible portions of each vessel. Bilateral testing [name] considered an integral part of a complete examination. Limited examinations for reoccurring indications may be performed as noted.  Summary: No evidence of pseudoaneurysm or AVF. There [name] a small hematoma noted  as well as a lymph node at area of concern. The partial CFV DVT found 04/20/2023 appears to be resolving.  Diagnosing physician: Sherald Hess MD Electronically  signed by Sherald Hess MD on 04/23/2023 at 11:44:41 AM.   --------------------------------------------------------------------------------    Final    VAS [place] LOWER EXTREMITY VENOUS (DVT) Result Date: 04/21/2023  Lower Venous DVT Study Patient Name:  BABE ANTHIS  Date of Exam:   04/20/2023 Medical Rec #: [id number]              Accession #:    [id number] Date of Birth: 01/27/1966              Patient Gender: F Patient Age:   31 years Exam Location[facility]  [facility] Procedure:      VAS [place] LOWER EXTREMITY VENOUS (DVT) Referring Phys: Marvel Plan --------------------------------------------------------------------------------  Indications: Stroke, Embolic stroke., and Post-op.  Comparison Study: Previous study on 9.21.2018. Performing Technologist: Fernande Bras  Examination Guidelines: A complete evaluation includes B-mode imaging, spectral Doppler, color Doppler, and power Doppler as needed of all accessible portions of each vessel. Bilateral testing [name] considered an integral part of a complete examination. Limited examinations for reoccurring indications may be performed as noted. The reflux portion of the exam [name] performed with the patient in reverse Trendelenburg.  +---------+---------------+---------+-----------+----------+--------------+ RIGHT    CompressibilityPhasicitySpontaneityPropertiesThrombus Aging +---------+---------------+---------+-----------+----------+--------------+ CFV      Partial        Yes      Yes                  Acute          +---------+---------------+---------+-----------+----------+--------------+ SFJ      Partial        Yes      Yes                                 +---------+---------------+---------+-----------+----------+--------------+ FV Prox  Full                                                         +---------+---------------+---------+-----------+----------+--------------+ FV Mid   Full                                                        +---------+---------------+---------+-----------+----------+--------------+ FV DistalFull                                                        +---------+---------------+---------+-----------+----------+--------------+ PFV      Full                                                        +---------+---------------+---------+-----------+----------+--------------+ POP      Full           Yes      Yes                                 +---------+---------------+---------+-----------+----------+--------------+  PTV      Full                                                        +---------+---------------+---------+-----------+----------+--------------+ PERO     Full                                                        +---------+---------------+---------+-----------+----------+--------------+ S/P revascularization of occluded left M1 with right CFA being point of access  +---------+---------------+---------+-----------+----------+--------------+ LEFT     CompressibilityPhasicitySpontaneityPropertiesThrombus Aging +---------+---------------+---------+-----------+----------+--------------+ CFV      Full           Yes      Yes                                 +---------+---------------+---------+-----------+----------+--------------+ SFJ      Full           Yes      Yes                                 +---------+---------------+---------+-----------+----------+--------------+ FV Prox  Full                                                        +---------+---------------+---------+-----------+----------+--------------+ FV Mid   Full                                                        +---------+---------------+---------+-----------+----------+--------------+ FV DistalFull                                                         +---------+---------------+---------+-----------+----------+--------------+ PFV      Full                                                        +---------+---------------+---------+-----------+----------+--------------+ POP      Full           Yes      Yes                                 +---------+---------------+---------+-----------+----------+--------------+ PTV      Full                                                        +---------+---------------+---------+-----------+----------+--------------+  PERO     Full                                                        +---------+---------------+---------+-----------+----------+--------------+     Summary: RIGHT: - Findings consistent with acute deep vein thrombosis involving the right common femoral vein, and SF junction.  - No cystic structure found in the popliteal fossa.  LEFT: - There [name] no evidence of deep vein thrombosis in the lower extremity.  - No cystic structure found in the popliteal fossa.  *See table(s) above for measurements and observations. Electronically signed by Lemar Livings MD on 04/21/2023 at 4:06:36 PM.    Final      Labs:  Basic Metabolic Panel:    Latest Ref Rng & Units 05/02/2023   10:37 AM 04/30/2023    5:37 AM 04/26/2023    5:32 AM  BMP  Glucose 70 - 99 mg/dL 865  93  85   BUN 6 - 20 mg/dL 8  7  13    Creatinine 0.44 - 1.00 mg/dL 7.84  6.96  2.95   Sodium 135 - 145 mmol/L 140  142  139   Potassium 3.5 - 5.1 mmol/L 3.4  3.5  3.7   Chloride 98 - 111 mmol/L 104  104  110   CO2 22 - 32 mmol/L 23  23  19    Calcium 8.9 - 10.3 mg/dL 9.2  9.4  8.0      CBC:    Latest Ref Rng & Units 05/02/2023   10:37 AM 04/30/2023    5:37 AM 04/26/2023    5:32 AM  CBC  WBC 4.0 - 10.5 K/uL 6.5  5.1  4.7   Hemoglobin 12.0 - 15.0 g/dL 28.4  13.2  44.0   Hematocrit 36.0 - 46.0 % 35.4  34.1  34.7   Platelets 150 - 400 K/uL 234  209  155      CBG: No  results for input(s): "GLUCAP" in the last 168 hours.  Brief HPI:   Brittany [name] [name] a 58 y.o. female with history of HTN, GERD, anemia, multiple myeloma, peripheral neuropathy, IBS, left cerebellar CVA who was originally admitted on 04/12/2023 with reports of 1 to 2 weeks of speech changes and decline in cognition.  CT head done revealing subacute stroke left MCA territory and CTA revealed short segment occlusion proximal left M2 and additional severe stenosis in insula left M2.  MRI brain revealed scattered acute and subacute infarcts involving left MCA distribution, few scattered superimposed subacute to chronic left MCA infarct and chronic left cerebellar infarct.  Neurology recommended DAPT x 3 weeks followed by aspirin as well as Zio patch as stroke felt to be due to large vessel disease versus cardioembolic versus due to hypercoagulopathy.  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  CTA revealed proximal left M1 occlusion with minimal collateralization of left MCA which had progressed from 04/12/2023 study.  She underwent cerebral angio with mechanical thrombectomy of occluded left MCA M1 segment with stent placement by Dr. Corliss Skains.  She was placed on dysphagia 3 diet with thin liquids per BSC.  She was noted to have reports of abdominal pain limiting activity question due to IBS  flare versus constipation.  BLE Dopplers were done due to history of small PFO and revealed acute DVT right CFV and SF junction.  Dr. Roda Shutters recommended IV heparin x 2 days and if neurologically stable, to transition to DOAC.  Therapy was working with patient who was limited by right-sided weakness with sensory deficits, right inattention due to hemianopsia, global aphasia and fatigue affecting ADLs and mobility.  She was independent prior to admission and CIR was recommended due  to functional decline.    Hospital Course: Brittany [name] was admitted to rehab 04/20/2023 for inpatient therapies to consist of PT, ST and OT at least three hours five days a week. Past admission physiatrist, therapy team and rehab RN have worked together to provide customized collaborative inpatient rehab.  Patient was started on IV heparin at admission and monitored closely.  She continued to be limited by right groin pain and vascular ultrasound done 02/02 which was negative for pseudoaneurysm and small hematoma noted.  Dr. Corliss Skains recommended avoiding hip flexion and rescanning in 1 to 2 days if pain worsened.  She was maintained on heparin and transition to apixaban on 02/04.  Follow-up CT head 02/04, showed more dense and better described central blood products with prior hemorrhage becoming less then and less distinct.  Dr. Roda Shutters was consulted for input and CT head was repeated on 02/05 and showed stable hemorrhagic infarct without progressive hemorrhage or mass effect. He recommended decreasing Brilinta to 60 mg BID. P2Y12 levels rechecked and noted to still be at 1 therefore Brilinta further decreased to 45 mg twice daily on 02/07.  Serial CBC showed H&H and platelets to be stable. Her blood pressures were monitored on TID basis and medications have been adjusted to prevent hypotension. Follow up BMET showed hypokalemia therefore low-dose K-Dur added for supplementation. She was hydrated briefly with IV fluids for AKI and has had improvement in SCr.  She was encouraged to continue pushing fluids during his stay.  She continued to be limited by right groin pain throughout his stay. CT abdomen pelvis was also ordered to follow-up on abdominal pain and showed moderate stool and fluid in colon without obstruction.  This was negative for colitis or diverticulitis and right groin was negative for aneurysm or fluid collection.  Linzess was added to Miralax and Senna to help manage constipation.     However patient has frequently refused laxatives therefore Linzess was discontinued to simplify regimen.  Constipation continues to be an issue and bowel program discussed with patient and family with recommendations to titrate upwards as needed symptoms.  Tramadol was use as needed to help manage pain control.  She continued to be limited by fatigue, RLQ pain as well as right inattention with global aphasia.  She requires supervision for safety at discharge.  She will continue to receive Home Health PT, OT and ST by [facility] and [facility] services to resume at home.    Rehab course: During patient's stay in rehab weekly team conferences were held to monitor patient's progress, set goals and discuss barriers to discharge. At admission, patient required min assist with basic ADL tasks and with mobility. She presented with apraxia and  global aphasia however receptive language skills felt to be slightly better than expressive language deficits. She  has had improvement in activity tolerance, balance, postural control as well as ability to compensate for deficits. She [name] able to complete ADL tasks with supervision.  She requires supervision for transfers and [name] able to ambulate  150 feet without assistive device. 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  She requires mod verbal and visual cues to follow simple one-step command and max visual verbal cues for confrontational naming.  She continues to fluctuate and ST questioned behavior was affecting her communication skills.  Discharge disposition: 06-Home-Health Care Svc  Diet: Heart Healthy.   Special Instructions: Needs assistance with medication management and with financial issues. Needs supervision for safety. Recommend recheck BMET in 1-2 weeks to monitor potassium level.   Discharge Instructions     Ambulatory referral to Neurology   Complete by: As directed    An appointment [name]  requested in approximately: 4-6 weeks   Ambulatory referral to Physical Medicine Rehab   Complete by: As directed    Hospital follow up      Allergies as of 05/03/2023       Reactions   Amitriptyline Other (See Comments)   Patient reported that it made her throat feel like its locking up and it also caused issues with her going to the bathroom   Duloxetine Other (See Comments)   Patient reported that it made her throat lock up and it caused her to have issue with going to the bathroom   Naproxen    Vomiting, sweating, abd spasms   Other    States can't take pain meds that end in "cet" or meds that end in "ine" Darvocet/severe vomiting   Beef-derived Drug Products    Patient has IBS prefers no beef   Pork-derived Products    Patient has IBS prefers no pork   Darvon [propoxyphene] Nausea And Vomiting   Hydrocodone Nausea And Vomiting   Lactose Intolerance (gi)    Bloating, gas, abd pain   Latex Itching, Rash   Oxycodone Nausea And Vomiting   Percocet [oxycodone-acetaminophen] Nausea And Vomiting   Topamax [topiramate]    Memory made her emotional        Medication List     STOP taking these medications    heparin 78295 UT/250ML infusion   linaclotide 72 MCG capsule Commonly known as: Linzess       TAKE these medications    amLODipine 10 MG tablet Commonly known as: NORVASC Take 1 tablet (10 mg total) by mouth daily.   atorvastatin 80 MG tablet Commonly known as: Lipitor Take 1 tablet (80 mg total) by mouth daily with supper. What changed: when to take this   Brilinta 90 MG Tabs tablet Generic drug: ticagrelor Take 0.5 tablets (45 mg total) by mouth 2 (two) times daily. What changed: how much to take   dicyclomine 10 MG capsule Commonly known as: BENTYL Take 1 capsule (10 mg total) by mouth 3 (three) times daily before meals.   docusate sodium 100 MG capsule Commonly known as: COLACE Take 1 capsule (100 mg total) by mouth 2 (two) times daily. What  changed: when to take this   Eliquis 5 MG Tabs tablet Generic drug: apixaban Take 1 tablet (5 mg total) by mouth 2 (two) times daily.   ezetimibe 10 MG tablet Commonly known as: ZETIA Take 1 tablet (10 mg total) by mouth daily.   gabapentin 300 MG capsule Commonly known as: NEURONTIN Take 1 capsule (300 mg total) by mouth 3 (three) times daily.   hydrALAZINE 50 MG tablet Commonly known as: APRESOLINE Take 1 tablet (50 mg total) by mouth every 8 (eight) hours.   lisinopril 2.5 MG tablet Commonly known as: ZESTRIL Take  1 tablet (2.5 mg total) by mouth daily. Start taking on: May 04, 2023   pantoprazole 40 MG tablet Commonly known as: PROTONIX Take 1 tablet (40 mg total) by mouth daily. Start taking on: May 04, 2023   polyethylene glycol powder 17 GM/SCOOP powder Commonly known as: GLYCOLAX/MIRALAX Take 1 capful (17 g) with water by mouth 2 (two) times daily.   potassium chloride 20 MEQ packet Commonly known as: KLOR-CON Dissolve 1 packet (20 mEq) in at least 4 ounces of cold water and take by mouth 2 (two) times daily.   Senna-S 8.6-50 MG tablet Generic drug: senna-docusate Take 2 tablets by mouth daily at 6 (six) AM. Start taking on: May 04, 2023   traMADol 50 MG tablet--Rx # 50 pills.  Commonly known as: Ultram Take 1-2 tablets (50-100 mg total) by mouth every 6 (six) hours as needed. Notes to patient: As needed--she has been using two pills twice a day as needed for severe pain. Try one tylenol and one ultram as needed and see if this [name] effective. Try to minimize use.   traZODone 50 MG tablet Commonly known as: DESYREL Take 0.5 tablets (25 mg total) by mouth at bedtime.   vitamin D3 25 MCG tablet Commonly known as: CHOLECALCIFEROL Take 1 tablet (1,000 Units total) by mouth daily.        Follow-up Information     Clayborne Dana, NP Follow up.   Specialties: Family Medicine, Emergency Medicine Why: Call in 1-2 days for post hospital follow  up Contact information: [address] [place] [address] [phone]         Fanny Dance, MD Follow up.   Specialty: Physical Medicine and Rehabilitation Why: office will call you with follow up appointment Contact information: [address] [place] [address] [phone]         [facility] Follow up.   Why: office will call you with follow up appointment Contact information: [address]     [address] [place] [address] [phone]        Julieanne Cotton, MD Follow up.   Specialties: Interventional Radiology, Radiology Why: office will call you with follow up appointment Contact information: [address] [place] [address] [phone]                 Signed: Jacquelynn Cree 05/03/2023, 5:49 PM

## 2023-05-03 NOTE — Progress Notes (Signed)
Inpatient Rehabilitation Care Coordinator Discharge Note   Patient Details  Name: Brittany [name] MRN: [id number] Date of Birth: 01-07-1966   Discharge location: HOME WITH PARETNS WHO CAN PROVIDE 24/7 SUPERVISION UNTIL MOVES TO FLA  Length of Stay: 13 DAYS  Discharge activity level: SUPERVISION LEVEL  Home/community participation: ACTIVE  Patient response WU:[id number] Literacy - How often do you need to have someone help you when you read instructions, pamphlets, or other written material from your doctor or pharmacy?: Never  Patient response YN:WGNFAO Isolation - How often do you feel lonely or isolated from those around you?: Never  Services provided included: MD, RD, PT, OT, SLP, RN, CM, Pharmacy, Neuropsych, SW  Financial Services:  Field seismologist Utilized: Other (Comment)    Choices offered to/list presented to: [employer]  Follow-up services arranged:  Home Health, DME, Patient/Family has no preference for HH/DME agencies Home Health Agency: [facility]  PT  OT  RN  SP    DME : [facility], 3 IN1 AND [facility]   PCS SERVICES TO RESUME AT PARENTS HOME-3-4 HOURS M-F Patient response to transportation need: Is the patient able to respond to transportation needs?: Yes In the past 12 months, has lack of transportation kept you from medical appointments or from getting medications?: No In the past 12 months, has lack of transportation kept you from meetings, work, or from getting things needed for daily living?: No   Patient/Family verbalized understanding of follow-up arrangements:  Yes  Individual responsible for coordination of the follow-up plan: EDWARD-DAD [phone]  Confirmed correct DME delivered: Lucy Chris 05/03/2023    Comments (or additional information):PARENTS WERE HERE FOR EDUCATION AND SEE THE CARE THEIR DAUGHTER WILL NEED. SOMEWHAT CONCERNED DUE TO OWN HEALTH ISSUES. PT MORE NEEDY WITH THEM AND CHILD-LIKE  Summary  of Stay    Date/Time Discharge Planning CSW  05/02/23 0844 Parents here yesterday for education comfortable with daughter's care needs. DME delivered to room and home health set up. PCS aide will resume upon DC. RGD  04/25/23 0857 Unsure of plan parents want her to return to apartment mod/i level. Son in [place] works and pt would need to re-apply for medicaid and have no serivces while waiting for this to get approved. Pt has no plan when came here. RGD       Lucy Chris

## 2023-05-03 NOTE — Progress Notes (Signed)
PROGRESS NOTE   Subjective/Complaints:  No new concerns or complaints elicited.  Scheduled to go home today. Patient denies constipation although she has not had a recorded bowel movement in several days.  She has reports she had a bowel movement 2 days ago.  Abdominal x-ray with no acute findings other than moderate stool burden.  She refused laxatives mag citrate yesterday and sorbitol the day prior.   ROS: limited due to aphasia. Denies CP, SOB, n/v, constipation  Objective:   DG Abd 2 Views Result Date: 05/02/2023 CLINICAL DATA:  Constipation.  Evaluate for stool burden. EXAM: ABDOMEN - 2 VIEW COMPARISON:  Abdominal radiograph dated 04/27/2023 FINDINGS: Moderate amount of stool throughout the colon. No bowel dilatation or evidence of obstruction. No free air or radiopaque calculi. The osseous structures are intact. The soft tissues are unremarkable IMPRESSION: Moderate colonic stool burden. No bowel obstruction. Electronically Signed   By: Elgie Collard M.D.   On: 05/02/2023 15:31    Recent Labs    05/02/23 1037  WBC 6.5  HGB 11.7*  HCT 35.4*  PLT 234    Recent Labs    05/02/23 1037  NA 140  K 3.4*  CL 104  CO2 23  GLUCOSE 112*  BUN 8  CREATININE 0.81  CALCIUM 9.2          Intake/Output Summary (Last 24 hours) at 05/03/2023 1110 Last data filed at 05/03/2023 0826 Gross per 24 hour  Intake 300 ml  Output --  Net 300 ml         Physical Exam: Vital Signs Blood pressure (!) 145/81, pulse 78, temperature 98.7 F (37.1 C), resp. rate 16, height 5\' 2"  (1.575 m), weight 68.9 kg, SpO2 97%.  Constitutional: NAD, laying in bed appears comfortable HENT:   Atraumatic, normocephalic. Eyes: PERRLA. EOM seem intact. No notable nystagmus.  Cardiovascular: RRR, no murmurs/rub/gallops. No Edema. Peripheral pulses 2+  Respiratory: CTAB. No rales, rhonchi, or wheezing. On RA.  Abdomen: soft, + bowel sounds,  nonTTP, nondistended Skin: C/D/I. No apparent lesions. MsK: No joint swelling noted Extremities: R groin with hematoma in the inguinal canal area-present but smaller overall, minimal TTP in this region, bruising improving   Neuro: Alert awake, answering yes/no questions appropriately more than not .Continues to have limited verbal output.  Gesturing for communication, following simple commands Continued aphasia  expressive more than receptive, dysarthric voice, mild right facial weakness Moving all 4 extremities to gravity and resistance   Assessment/Plan: 1. Functional deficits which require 3+ hours per day of interdisciplinary therapy in a comprehensive inpatient rehab setting. Physiatrist is providing close team supervision and 24 hour management of active medical problems listed below. Physiatrist and rehab team continue to assess barriers to discharge/monitor patient progress toward functional and medical goals  Care Tool:  Bathing    Body parts bathed by patient: Right arm, Left arm, Chest, Abdomen, Front perineal area, Buttocks, Right upper leg, Left upper leg, Right lower leg, Left lower leg, Face         Bathing assist Assist Level: Supervision/Verbal cueing     Upper Body Dressing/Undressing Upper body dressing   What is the patient wearing?: Pull over shirt  Upper body assist Assist Level: Independent with assistive device    Lower Body Dressing/Undressing Lower body dressing      What is the patient wearing?: Underwear/pull up, Pants     Lower body assist Assist for lower body dressing: Supervision/Verbal cueing     Toileting Toileting    Toileting assist Assist for toileting: Supervision/Verbal cueing     Transfers Chair/bed transfer  Transfers assist     Chair/bed transfer assist level: Supervision/Verbal cueing     Locomotion Ambulation   Ambulation assist      Assist level: Supervision/Verbal cueing Assistive device: Rollator Max  distance: 150   Walk 10 feet activity   Assist     Assist level: Supervision/Verbal cueing Assistive device: No Device, Rollator   Walk 50 feet activity   Assist    Assist level: Supervision/Verbal cueing Assistive device: Rollator, No Device    Walk 150 feet activity   Assist Walk 150 feet activity did not occur: Safety/medical concerns  Assist level: Supervision/Verbal cueing Assistive device: Rollator    Walk 10 feet on uneven surface  activity   Assist Walk 10 feet on uneven surfaces activity did not occur: Safety/medical concerns   Assist level: Supervision/Verbal cueing Assistive device: Rollator   Wheelchair     Assist Is the patient using a wheelchair?: No Type of Wheelchair: Manual    Wheelchair assist level: Dependent - Patient 0% Max wheelchair distance: 250    Wheelchair 50 feet with 2 turns activity    Assist        Assist Level: Dependent - Patient 0%   Wheelchair 150 feet activity     Assist      Assist Level: Dependent - Patient 0%   Blood pressure (!) 145/81, pulse 78, temperature 98.7 F (37.1 C), resp. rate 16, height 5\' 2"  (1.575 m), weight 68.9 kg, SpO2 97%.  Medical Problem List and Plan: 1. Functional deficits secondary to left MCA infarct due to severe left MCA stenosis; notable Hx prior L cerebellar CVAs             -patient may not shower while on continuous heparin drip             -ELOS/Goals: 10 days, SPV PT/OT, Min A SLP             -04/21/23 CIR evals today; ordered SLP eval since it wasn't on there  -Continue CIR  -Repeat CT 2/4- discussed with neuro felt to be stable, neuro checking repeat CT today 2/5 and  ordered P2y12 test- 1  -Will change to 15/7 schedule   -DC home today  2.  R-CFV DVT (found 1/31)/Antithrombotics: -DVT/anticoagulation:  Pharmaceutical: Heparin low dose X 48 hours and if stable transition to DOAC              -antiplatelet therapy: Brillinta. ASA d/c due to addition of  Heparin.  - Per lit review, early mobilization current recommendation, without gross restrictions; no convincing evidence of increased PE risk.   -04/22/23 long discussion with pt and brother regarding clot location, this coincides with area of pain, large palpable ?hematoma to R groin; had mechanical thrombectomy-- called IR Dr. Richarda Overlie who relayed to the team, Anders Grant NP will come see the patient, appreciate their assistance with this case. For now, added Tramadol 100mg  q8h PRN to help with pain.  -04/23/23 Vas U/S with hematoma/continued DVT-smaller, Pt seen by IR this AM - recommend avoiding bending or stooping, limited mobility- transfers only for 1-2  days.  Ok to transition to eliquis  -2/6 Dr. Corliss Skains recommends 1 more day of light activity than full activity Friday -2/7 OK for full activity today, neurology adjusting Brilinta dose to 45 mg BID given results of P2y12, she is also on Eliquis 5 mg twice daily   3. Pain Management:  Tylenol prn. Gabapentin 300mg  TID -04/22/23 added Tramadol 100mg  q8h PRN for pain in R groin (see above)-- pt allergic/intolerant to other hydro/oxycodone.  -2/4 scheduled Tylenol -2/7 pain controlled overall, tramadol as needed was restarted over the weekend -2/12 Intermittent use of Tramadol PRN, adjust to q6h  4. Mood/Behavior/Sleep: LCSW to follow for evaluation and support.              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 Neuropsych/cognition: This patient is not capable of making decisions on her own behalf. 6. Skin/Wound Care: Routine pressure relief measures.  7.  Fluids/Electrolytes/Nutrition: Monitor I/O. Check routine labs -04/21/23 Cr 1.19 a little up from yesterday but down from prior, monitor; K 3.3, see below; remainder of CMP unremarkable, monitor labs 8. HTN: Monitor BP TID--continue amlodipine 10mg  daily and hydralazine 50mg  q8h             --avoid hypoperfusion.   -2/4 intermittently elevated, continue to monitor trend for now.  Avoid hypotension  2/7 BP has been elevated, will start low-dose metoprolol 12.5 mg twice daily  -2/12 HR mildly low at times, will DC metoprolol, start lisinopril 2.5 mg  -2/12 continue current regimen, follow-up with PCP    05/03/2023    6:17 AM 05/03/2023    4:45 AM 05/02/2023    9:28 PM  Vitals with BMI  Systolic 145 145 161  Diastolic 81 81 60  Pulse  78     9. RLQ pain:  Felt to be due to IBS. Has been on miralax daily and Senna S 2 tabs nightly without results --Linzess every day was started today. Will get KUB  -04/21/23 KUB with gas filled colon but otherwise neg/nonobstructive; reviewed images myself, suspect stool burden contributory to gas trapped in R side; will increase miralax/colace to BID, leave senna S as is, add sorbitol 30ml PRN if no BM today; has PRN dulcolax supp and fleet if needed too; monitor closely -04/22/23 pt insists she's had BMs daily, but no documentation; no abd pain today, appears it's R groin pain-- see above #2-- tramadol added, leave stool softeners in place, NEED DOCUMENTATION OF STOOLS CT abdomen on 2/4 neg for acute changes, mod stool -2/6 LBM today having more regular bowel movements -2/7-nursing reports patient's pain worsened after her Linzess, family indicates she may have been having pain with this medication previously.  Discussed with GI.  Will discontinue Linzess.  Will check x-ray for stool burden. -04/28/23 xray yesterday unremarkable. No c/o abd pain today. LBM 2 days ago but liquidy; Monitor.  -04/29/23 no abd discomfort today, LBM 2/6 liquidy, monitor for BM  overnight, if none then may want to address it further.  -2/10 No abd pain today, LBM 2/6 will order sorbitol 45ml -2/11 patient declined sorbitol, reports bowel movement yesterday.  And also appears she is declining some of her as needed  medications.  Continue current regimen and monitor for now -2/12 took PRN sorbitol 30ml yesterday Xray to check stool burdon-moderate stool burden.  Mag citrate was ordered yesterday and she refused.  Discussed additional laxatives to help prevent constipation.   10. Hypokalemia: Supplement added X 2 days.  -04/21/23 K 3.3, potassium supplement not ordered, daily x2 days added today -2/10 potassium on the low side at 3.5 today, will order 20 mEq x 1 of potassium -Recheck levels today- 3.4 order -Advise recheck with PCP 11. Hyperlipidemia: LDL-124 on Lipitor 80 mg and Zetia 10mg  daily.  12. Pre-diabetes: Hgb A1c- 5.9 and every 4 hours CBS have shown BS controlled. --Will monitor fasting BS on serial labs.  13. Substance abuse: Educated on stroke risk w/marijuana.  14. H/o MM: Acute renal failure improving SCr 1.11 @ admission-->1.59-->1.07  -04/21/23 Cr 1.19, monitor  -2/3 Cr improved to 0.97  Recheck labs, start IVF NS 22ml/hr due to poor intake   2/6 creatinine down to 0.92, DC IVF and encourage oral fluids  2/10 Creatinine stable at 0.85, continue to monitor fluid intake  2/11 continuing to encourage p.o. fluid intake  2/13 creatinine stable yesterday at 0.81, continue regular fluid intake 15. GERD: On protonix 40mg  daily 16. Chest pain episode 2/6  -EKG NSR, Trop did not trend up. Pain resolved-suspect this may have been more abdominal pain  17. Oral candidiasis: noted by RN, nystatin QID x7d ordered 18.  Anemia  -2/10 Hgb 11.2 I suspect this may be partially delusional as she was in similar range on 1/28-29.    -2/12 hemoglobin stable 11.7     LOS: 13 days A FACE TO FACE EVALUATION WAS PERFORMED  Fanny Dance 05/03/2023, 11:10  AM

## 2023-05-03 NOTE — Plan of Care (Signed)
  Problem: Consults Goal: RH STROKE PATIENT EDUCATION Description: See Patient Education module for education specifics  Outcome: Progressing Goal: Diabetes Guidelines if Diabetic/Glucose > 140 Description: If diabetic or lab glucose is > 140 mg/dl - Initiate Diabetes/Hyperglycemia Guidelines & Document Interventions  Outcome: Progressing   Problem: RH SAFETY Goal: RH STG ADHERE TO SAFETY PRECAUTIONS W/ASSISTANCE/DEVICE Description: STG Adhere to Safety Precautions With Assistance/Device. Outcome: Progressing   Problem: RH COGNITION-NURSING Goal: RH STG USES MEMORY AIDS/STRATEGIES W/ASSIST TO PROBLEM SOLVE Description: STG Uses Memory Aids/Strategies With  MOD I Assistance to Problem Solve. Outcome: Progressing   Problem: RH PAIN MANAGEMENT Goal: RH STG PAIN MANAGED AT OR BELOW PT'S PAIN GOAL Description: <4 w/ prns Outcome: Progressing   Problem: RH KNOWLEDGE DEFICIT Goal: RH STG INCREASE KNOWLEDGE OF DIABETES Description: Manage DM with educational materials  provided  with cues/ supervision Outcome: Progressing Goal: RH STG INCREASE KNOWLEDGE OF HYPERTENSION Description: Manage HTN with educational materials provided  with cues/ supervision Outcome: Progressing Goal: RH STG INCREASE KNOWLEDGE OF DYSPHAGIA/FLUID INTAKE Description: Manage dysphagia with educational materials provided with MOD I assist Outcome: Progressing Goal: RH STG INCREASE KNOWLEGDE OF HYPERLIPIDEMIA Description: Manage HDL with educational materials  provided with cues/ supervision Outcome: Progressing Goal: RH STG INCREASE KNOWLEDGE OF STROKE PROPHYLAXIS Description: Manage secondary risk factors / stroke prophylaxis with educational materials  provided  with cues/ supervision Outcome: Progressing

## 2023-05-04 ENCOUNTER — Telehealth: Payer: Self-pay

## 2023-05-04 DIAGNOSIS — M67432 Ganglion, left wrist: Secondary | ICD-10-CM | POA: Diagnosis not present

## 2023-05-04 DIAGNOSIS — E538 Deficiency of other specified B group vitamins: Secondary | ICD-10-CM | POA: Diagnosis not present

## 2023-05-04 DIAGNOSIS — K449 Diaphragmatic hernia without obstruction or gangrene: Secondary | ICD-10-CM | POA: Diagnosis not present

## 2023-05-04 DIAGNOSIS — M62838 Other muscle spasm: Secondary | ICD-10-CM | POA: Diagnosis not present

## 2023-05-04 DIAGNOSIS — I700 Atherosclerosis of aorta: Secondary | ICD-10-CM | POA: Diagnosis not present

## 2023-05-04 DIAGNOSIS — N179 Acute kidney failure, unspecified: Secondary | ICD-10-CM | POA: Diagnosis not present

## 2023-05-04 DIAGNOSIS — E785 Hyperlipidemia, unspecified: Secondary | ICD-10-CM | POA: Diagnosis not present

## 2023-05-04 DIAGNOSIS — N8110 Cystocele, unspecified: Secondary | ICD-10-CM | POA: Diagnosis not present

## 2023-05-04 DIAGNOSIS — D649 Anemia, unspecified: Secondary | ICD-10-CM | POA: Diagnosis not present

## 2023-05-04 DIAGNOSIS — F419 Anxiety disorder, unspecified: Secondary | ICD-10-CM | POA: Diagnosis not present

## 2023-05-04 DIAGNOSIS — G629 Polyneuropathy, unspecified: Secondary | ICD-10-CM | POA: Diagnosis not present

## 2023-05-04 DIAGNOSIS — K219 Gastro-esophageal reflux disease without esophagitis: Secondary | ICD-10-CM | POA: Diagnosis not present

## 2023-05-04 DIAGNOSIS — R7303 Prediabetes: Secondary | ICD-10-CM | POA: Diagnosis not present

## 2023-05-04 DIAGNOSIS — I69351 Hemiplegia and hemiparesis following cerebral infarction affecting right dominant side: Secondary | ICD-10-CM | POA: Diagnosis not present

## 2023-05-04 DIAGNOSIS — I69320 Aphasia following cerebral infarction: Secondary | ICD-10-CM | POA: Diagnosis not present

## 2023-05-04 DIAGNOSIS — H5347 Heteronymous bilateral field defects: Secondary | ICD-10-CM | POA: Diagnosis not present

## 2023-05-04 DIAGNOSIS — M65312 Trigger thumb, left thumb: Secondary | ICD-10-CM | POA: Diagnosis not present

## 2023-05-04 DIAGNOSIS — I69398 Other sequelae of cerebral infarction: Secondary | ICD-10-CM | POA: Diagnosis not present

## 2023-05-04 DIAGNOSIS — E876 Hypokalemia: Secondary | ICD-10-CM | POA: Diagnosis not present

## 2023-05-04 DIAGNOSIS — I119 Hypertensive heart disease without heart failure: Secondary | ICD-10-CM | POA: Diagnosis not present

## 2023-05-04 DIAGNOSIS — M1990 Unspecified osteoarthritis, unspecified site: Secondary | ICD-10-CM | POA: Diagnosis not present

## 2023-05-04 DIAGNOSIS — E7800 Pure hypercholesterolemia, unspecified: Secondary | ICD-10-CM | POA: Diagnosis not present

## 2023-05-04 DIAGNOSIS — I82411 Acute embolism and thrombosis of right femoral vein: Secondary | ICD-10-CM | POA: Diagnosis not present

## 2023-05-04 DIAGNOSIS — M24111 Other articular cartilage disorders, right shoulder: Secondary | ICD-10-CM | POA: Diagnosis not present

## 2023-05-04 NOTE — Telephone Encounter (Signed)
Called and spoke with patient's mother (patient is unable to communicate) and she is doing okay. Has an appt here Monday. She is taking all medication and they will keep an eye on BP readings. If symptoms develop or BP remains elevated they will seek care in ED.

## 2023-05-04 NOTE — Telephone Encounter (Signed)
Copied from CRM [id number]. Topic: Clinical - Home Health Verbal Orders >> May 04, 2023 11:00 AM Fredrich Romans wrote: Caller/Agency: [facility]  Contact [phone]  Mathis Fare form [facility] called in to report patints B/p is 165/80,left arm sitting,no complaint of headache or dizziness,temp 98.3,heart rate 92 bmp

## 2023-05-07 ENCOUNTER — Other Ambulatory Visit: Payer: Self-pay

## 2023-05-07 ENCOUNTER — Encounter (HOSPITAL_BASED_OUTPATIENT_CLINIC_OR_DEPARTMENT_OTHER): Payer: Self-pay | Admitting: Emergency Medicine

## 2023-05-07 ENCOUNTER — Encounter: Payer: Self-pay | Admitting: Family Medicine

## 2023-05-07 ENCOUNTER — Telehealth: Payer: Self-pay | Admitting: Neurology

## 2023-05-07 ENCOUNTER — Emergency Department (HOSPITAL_BASED_OUTPATIENT_CLINIC_OR_DEPARTMENT_OTHER): Payer: Medicaid Other

## 2023-05-07 ENCOUNTER — Ambulatory Visit (INDEPENDENT_AMBULATORY_CARE_PROVIDER_SITE_OTHER): Payer: Medicaid Other | Admitting: Family Medicine

## 2023-05-07 ENCOUNTER — Emergency Department (HOSPITAL_BASED_OUTPATIENT_CLINIC_OR_DEPARTMENT_OTHER)
Admission: EM | Admit: 2023-05-07 | Discharge: 2023-05-08 | Disposition: A | Discharge status: Home / Self Care | Payer: Medicaid Other | Attending: Emergency Medicine | Admitting: Emergency Medicine

## 2023-05-07 VITALS — BP 139/96 | HR 99 | Ht 62.0 in | Wt 150.0 lb

## 2023-05-07 DIAGNOSIS — Z8673 Personal history of transient ischemic attack (TIA), and cerebral infarction without residual deficits: Secondary | ICD-10-CM | POA: Diagnosis not present

## 2023-05-07 DIAGNOSIS — Z79899 Other long term (current) drug therapy: Secondary | ICD-10-CM | POA: Diagnosis not present

## 2023-05-07 DIAGNOSIS — Z91040 Latex allergy status: Secondary | ICD-10-CM | POA: Insufficient documentation

## 2023-05-07 DIAGNOSIS — Z1231 Encounter for screening mammogram for malignant neoplasm of breast: Secondary | ICD-10-CM

## 2023-05-07 DIAGNOSIS — Z7901 Long term (current) use of anticoagulants: Secondary | ICD-10-CM | POA: Diagnosis not present

## 2023-05-07 DIAGNOSIS — R1031 Right lower quadrant pain: Secondary | ICD-10-CM | POA: Diagnosis present

## 2023-05-07 DIAGNOSIS — T465X5A Adverse effect of other antihypertensive drugs, initial encounter: Secondary | ICD-10-CM | POA: Diagnosis not present

## 2023-05-07 DIAGNOSIS — E876 Hypokalemia: Secondary | ICD-10-CM | POA: Diagnosis not present

## 2023-05-07 DIAGNOSIS — I10 Essential (primary) hypertension: Secondary | ICD-10-CM | POA: Insufficient documentation

## 2023-05-07 DIAGNOSIS — R079 Chest pain, unspecified: Secondary | ICD-10-CM

## 2023-05-07 DIAGNOSIS — T50905A Adverse effect of unspecified drugs, medicaments and biological substances, initial encounter: Secondary | ICD-10-CM

## 2023-05-07 DIAGNOSIS — R0789 Other chest pain: Secondary | ICD-10-CM | POA: Diagnosis not present

## 2023-05-07 DIAGNOSIS — I639 Cerebral infarction, unspecified: Secondary | ICD-10-CM

## 2023-05-07 LAB — BASIC METABOLIC PANEL
Anion gap: 12 (ref 5–15)
BUN: 7 mg/dL (ref 6–20)
CO2: 20 mmol/L — ABNORMAL LOW (ref 22–32)
Calcium: 9.2 mg/dL (ref 8.9–10.3)
Chloride: 104 mmol/L (ref 98–111)
Creatinine, Ser: 0.78 mg/dL (ref 0.44–1.00)
GFR, Estimated: >60 mL/min (ref >60)
Glucose, Bld: 109 mg/dL — ABNORMAL HIGH (ref 70–99)
Potassium: 4.9 mmol/L (ref 3.5–5.1)
Sodium: 136 mmol/L (ref 135–145)

## 2023-05-07 LAB — CBC
HCT: 34.9 % — ABNORMAL LOW (ref 36.0–46.0)
Hemoglobin: 11.7 g/dL — ABNORMAL LOW (ref 12.0–15.0)
MCH: 29.3 pg (ref 26.0–34.0)
MCHC: 33.5 g/dL (ref 30.0–36.0)
MCV: 87.5 fL (ref 80.0–100.0)
Platelets: 254 10*3/uL (ref 150–400)
RBC: 3.99 MIL/uL (ref 3.87–5.11)
RDW: 14.3 % (ref 11.5–15.5)
WBC: 9.6 10*3/uL (ref 4.0–10.5)
nRBC: 0 % (ref 0.0–0.2)

## 2023-05-07 LAB — PROTIME-INR
INR: 1.4 — ABNORMAL HIGH (ref 0.8–1.2)
Prothrombin Time: 17.5 s — ABNORMAL HIGH (ref 11.4–15.2)

## 2023-05-07 LAB — TROPONIN I (HIGH SENSITIVITY): Troponin I (High Sensitivity): 25 ng/L — ABNORMAL HIGH (ref <18)

## 2023-05-07 MED ORDER — LISINOPRIL 5 MG PO TABS: 5.0000 mg | ORAL_TABLET | Freq: Every day | ORAL | 3 refills | Status: DC

## 2023-05-07 MED ORDER — POTASSIUM CHLORIDE ER 10 MEQ PO TBCR: 20.0000 meq | EXTENDED_RELEASE_TABLET | Freq: Two times a day (BID) | ORAL | 3 refills | Status: DC

## 2023-05-07 NOTE — Patient Instructions (Signed)
Increase lisinopril to 5 mg daily. Follow-up in 2-3 weeks to recheck. See me. Monitor at home.

## 2023-05-07 NOTE — Telephone Encounter (Signed)
Copied from CRM [id number]. Topic: Clinical - Home Health Verbal Orders >> May 07, 2023  8:56 AM Marica Otter wrote: Caller/Agency: [facility] Callback Number: [phone] Can leave secure message Service Requested: Physical Therapy Frequency: 1 week 9 effective February 14 including Occupational and speech therapy to evaluate and treat. Any new concerns about the patient? No

## 2023-05-07 NOTE — Telephone Encounter (Signed)
Called and gave VO for speech and OT.

## 2023-05-07 NOTE — Assessment & Plan Note (Signed)
Recent hospitalization and inpatient rehab for stroke. Currently experiencing residual right-sided facial numbness and speech difficulties. Progress noted with occasional full sentences and physical improvements. -Continue with home PT, OT, and speech therapy as arranged. -Continue current medication regimen.

## 2023-05-07 NOTE — Assessment & Plan Note (Signed)
Blood pressure elevated during visit, possibly due to pain. Home readings variable, typically borderline high. -Continue current antihypertensive medications, but increase lisinopril to 5 mg daily. Follow-up with me in 2-3 weeks.  -Check blood pressure at home and report any consistently high readings.

## 2023-05-07 NOTE — Progress Notes (Signed)
Established Patient Office Visit / Hospital Follow-up  Subjective   Patient ID: Brittany [name], female    DOB: 1966/03/19  Age: 58 y.o. MRN: [id number]  Chief Complaint  Patient presents with   Medical Management of Chronic Issues    HPI  Patient is here for hospital follow-up.   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    Her initial presentation was 1 to 2 weeks of speech changes and declines in cognition.  She initially had abnormal CT and MRI on 04/12/2023 with symptoms worsening on 04/16/2023 (global aphasia, right-sided facial droop, right-sided weakness, left gaze preference, right hemianopia) with new code stroke CT head showing expected evolution of the recent infarct.  She had a CTA head and neck showing proximal left M1 occlusion and only minimal collateralization of left MCA branch vessels progressively worsening from original scans.  She had another MRI on 04/17/2023 showing acute left MCA infarct with intraparenchymal hemorrhage centered in the left basal ganglia with an MRA showing left M1 was patent.  She had an echo with LVEF 60 to 65% and moderately dilated left atria.  She did have a noted history of a small PFO noted on the TEE in the past.  Vascular ultrasound showed acute DVT of right common femoral vein and SVC junction.  Her LDL was 124 despite goal of less than 70, so Zetia was added to her statin at discharge.  Her UDS was positive for THC and cessation education was provided.  She was transferred to inpatient rehab for ongoing PT, OT and speech therapy.  She was started on aspirin 81 mg daily and Brilinta 90 mg twice daily for secondary stroke prevention.  She was instructed to follow-up with PCP in [place] neuro after discharge from rehab along with neurointerventional  radiology 4 to 8 weeks following discharge from rehab.  Patient started with inpatient rehab on January 31 and discharged on February 13.      Discussed the use of AI scribe software for clinical note transcription with the patient, who gave verbal consent to proceed.  History of Present Illness   Brittany [name] is a 58 year old female with a recent stroke who presents for a hospital and rehab follow-up. She is accompanied by her mother, who she has been living with since discharge.  She was hospitalized at [facility] from January 23 to January 31 for a stroke, followed by a two-week inpatient rehabilitation involving physical therapy, occupational therapy, and speech therapy. She experiences difficulty with speech, particularly in getting words out, though her speech is improving with occasional full sentences emerging spontaneously. She uses a wheelchair intermittently at home but can perform some activities of daily living independently, such as bathing and making the bed, though she tires easily. She is awaiting further therapy sessions at home, and her in-home healthcare provider is scheduled to visit soon. No swallowing issues are present, and bowel movements are regular, though she experienced excessive bowel movements when taking Linzess daily, which has since been adjusted to every two or three days.  She experiences ongoing numbness and soreness on the right side of her face and gums, described as a tingly, burning feeling. She does not have a dentist due to Medicaid limitations, and her mother is seeking an appointment.  She experiences soreness in  the groin area due to a procedural access site, which sometimes necessitates the use of a heating pad and occasional tramadol for pain management. She is attempting to reduce her use of tramadol and gabapentin. Her mood is generally good, and she is not experiencing depression.  Her blood pressure has been high at times, particularly  when in pain, and she has been managing it with amlodipine and hydralazine. Home readings show variability, with some high readings during episodes of pain or agitation.  She is having difficulty with potassium powder taste, which affects her appetite. She would like to change to tablets since she is swallowing fine.   She has an inverted left nipple, which is new to her mother, though she claims it has been present for a while. No lumps or bumps have been felt. She is due for a mammogram.                        ROS All review of systems negative except what is listed in the HPI    Objective:     BP (!) 139/96   Pulse 99   Ht 5\' 2"  (1.575 m)   Wt 150 lb (68 kg)   SpO2 99%   BMI 27.44 kg/m    Physical Exam Vitals reviewed.  Constitutional:      Appearance: Normal appearance.  Cardiovascular:     Rate and Rhythm: Normal rate and regular rhythm.     Heart sounds: Normal heart sounds.  Pulmonary:     Effort: Pulmonary effort is normal.     Breath sounds: Normal breath sounds.  Chest:  Breasts:    Right: No swelling, bleeding, inverted nipple, mass, nipple discharge, skin change or tenderness.     Left: Inverted nipple present. No swelling, bleeding, mass, nipple discharge, skin change or tenderness.  Skin:    General: Skin is warm and dry.  Neurological:     Mental Status: She is alert and oriented to person, place, and time.     Cranial Nerves: Dysarthria and facial asymmetry present.     Comments: Right grip strength mildly decreased compared to left  Psychiatric:        Mood and Affect: Mood normal.        Behavior: Behavior normal.        Thought Content: Thought content normal.        Judgment: Judgment normal.       No results found for any visits on 05/07/23.    The ASCVD Risk score (Arnett DK, et al., 2019) failed to calculate for the following reasons:   Risk score cannot be calculated because patient has a medical history suggesting  prior/existing ASCVD    Assessment & Plan:   Problem List Items Addressed This Visit       Active Problems   Stroke (cerebrum) (HCC) - Primary   Recent hospitalization and inpatient rehab for stroke. Currently experiencing residual right-sided facial numbness and speech difficulties. Progress noted with occasional full sentences and physical improvements. -Continue with home PT, OT, and speech therapy as arranged. -Continue current medication regimen.      Relevant Medications   lisinopril (ZESTRIL) 5 MG tablet   Essential hypertension   Blood pressure elevated during visit, possibly due to pain. Home readings variable, typically borderline high. -Continue current antihypertensive medications, but increase lisinopril to 5 mg daily. Follow-up with me in 2-3 weeks.  -Check blood pressure at home and report any consistently high  readings.      Relevant Medications   potassium chloride (KLOR-CON 10) 10 MEQ tablet   lisinopril (ZESTRIL) 5 MG tablet   Other Relevant Orders   CBC with Differential/Platelet   Comprehensive metabolic panel   Hypokalemia   Difficulty tolerating potassium powder, affecting appetite and meal consumption. -Switch to potassium tablets -Recheck potassium levels today.       Relevant Orders   Comprehensive metabolic panel   Other Visit Diagnoses       Encounter for screening mammogram for malignant neoplasm of breast       Relevant Orders   MM 3D SCREENING MAMMOGRAM BILATERAL BREAST       Return in about 2 weeks (around 05/21/2023) for HTN follow-up.    Clayborne Dana, NP

## 2023-05-07 NOTE — ED Triage Notes (Signed)
Pt to ED from home c/o chest pain and right groin pain that started this afternoon.  Pt has hx of stroke several weeks ago with stent placement.  Pt unable to communicate since stroke and mother helping with hx.

## 2023-05-07 NOTE — Assessment & Plan Note (Signed)
Difficulty tolerating potassium powder, affecting appetite and meal consumption. -Switch to potassium tablets -Recheck potassium levels today.

## 2023-05-08 DIAGNOSIS — I6930 Unspecified sequelae of cerebral infarction: Secondary | ICD-10-CM | POA: Diagnosis not present

## 2023-05-08 LAB — COMPREHENSIVE METABOLIC PANEL
ALT: 34 U/L (ref 0–35)
AST: 29 U/L (ref 0–37)
Albumin: 4.4 g/dL (ref 3.5–5.2)
Alkaline Phosphatase: 128 U/L — ABNORMAL HIGH (ref 39–117)
BUN: 7 mg/dL (ref 6–23)
CO2: 27 meq/L (ref 19–32)
Calcium: 9.8 mg/dL (ref 8.4–10.5)
Chloride: 104 meq/L (ref 96–112)
Creatinine, Ser: 0.88 mg/dL (ref 0.40–1.20)
GFR: 72.93 mL/min (ref >60.00)
Glucose, Bld: 87 mg/dL (ref 70–99)
Potassium: 4.3 meq/L (ref 3.5–5.1)
Sodium: 140 meq/L (ref 135–145)
Total Bilirubin: 0.5 mg/dL (ref 0.2–1.2)
Total Protein: 7.6 g/dL (ref 6.0–8.3)

## 2023-05-08 LAB — CBC WITH DIFFERENTIAL/PLATELET
Basophils Absolute: 0 10*3/uL (ref 0.0–0.1)
Basophils Relative: 0.4 % (ref 0.0–3.0)
Eosinophils Absolute: 0 10*3/uL (ref 0.0–0.7)
Eosinophils Relative: 0.5 % (ref 0.0–5.0)
HCT: 36.8 % (ref 36.0–46.0)
Hemoglobin: 12.2 g/dL (ref 12.0–15.0)
Lymphocytes Relative: 34.7 % (ref 12.0–46.0)
Lymphs Abs: 2.7 10*3/uL (ref 0.7–4.0)
MCHC: 33.2 g/dL (ref 30.0–36.0)
MCV: 90.1 fL (ref 78.0–100.0)
Monocytes Absolute: 0.9 10*3/uL (ref 0.1–1.0)
Monocytes Relative: 11.3 % (ref 3.0–12.0)
Neutro Abs: 4.2 10*3/uL (ref 1.4–7.7)
Neutrophils Relative %: 53.1 % (ref 43.0–77.0)
Platelets: 255 10*3/uL (ref 150.0–400.0)
RBC: 4.08 Mil/uL (ref 3.87–5.11)
RDW: 14.6 % (ref 11.5–15.5)
WBC: 7.9 10*3/uL (ref 4.0–10.5)

## 2023-05-08 NOTE — ED Provider Notes (Signed)
[place] [facility] Provider Note   CSN: [id number] Arrival date & time: 05/07/23  2128     History  Chief Complaint  Patient presents with   Chest Pain   Groin Pain    Brittany [name] is a 58 y.o. female.  Patient with history of HTN, HLD, recent CVA s/p arterial stenting, DVT. Here with parents with complaint of chest pain and high heart rate. She also complains of right groin pain. No fever, vomiting, cough or difficulty breathing. Here with parents who assist with care since recent stroke. Mom states she gave her night time medications which include hydralazine, new since last admission. She states the patient complains of chest pain and elevation of heart rate after she takes this.   The history is provided by a parent. No language interpreter was used.  Chest Pain Groin Pain Associated symptoms include chest pain.       Home Medications Prior to Admission medications   Medication Sig Start Date End Date Taking? Authorizing Provider  amLODipine (NORVASC) 10 MG tablet Take 1 tablet (10 mg total) by mouth daily. 05/03/23 06/02/23  Love, Evlyn Kanner, PA-C  apixaban (ELIQUIS) 5 MG TABS tablet Take 1 tablet (5 mg total) by mouth 2 (two) times daily. 05/03/23   Love, Evlyn Kanner, PA-C  atorvastatin (LIPITOR) 80 MG tablet Take 1 tablet (80 mg total) by mouth daily with supper. 05/03/23 05/02/24  Jacquelynn Cree, PA-C  cholecalciferol (CHOLECALCIFEROL) 25 MCG tablet Take 1 tablet (1,000 Units total) by mouth daily. 04/21/23   Lynnae January, NP  dicyclomine (BENTYL) 10 MG capsule Take 1 capsule (10 mg total) by mouth 3 (three) times daily before meals. 05/03/23   Love, Evlyn Kanner, PA-C  docusate sodium (COLACE) 100 MG capsule Take 1 capsule (100 mg total) by mouth 2 (two) times daily. 05/03/23   Love, Evlyn Kanner, PA-C  ezetimibe (ZETIA) 10 MG tablet Take 1 tablet (10 mg total) by mouth daily. 05/03/23   Love, Evlyn Kanner, PA-C  gabapentin (NEURONTIN) 300 MG  capsule Take 1 capsule (300 mg total) by mouth 3 (three) times daily. Patient taking differently: Take 300 mg by mouth 2 (two) times daily. 05/03/23   Love, Evlyn Kanner, PA-C  hydrALAZINE (APRESOLINE) 50 MG tablet Take 1 tablet (50 mg total) by mouth every 8 (eight) hours. 12/09/22 04/12/23  Arrien, York Ram, MD  linaclotide [facility]) 72 MCG capsule Take 72 mcg by mouth every 3 (three) days.    [provider]  lisinopril (ZESTRIL) 5 MG tablet Take 1 tablet (5 mg total) by mouth daily. 05/07/23   Clayborne Dana, NP  pantoprazole (PROTONIX) 40 MG tablet Take 1 tablet (40 mg total) by mouth daily. 05/04/23   Love, Evlyn Kanner, PA-C  potassium chloride (KLOR-CON 10) 10 MEQ tablet Take 2 tablets (20 mEq total) by mouth 2 (two) times daily. 05/07/23   Clayborne Dana, NP  senna-docusate (SENOKOT-S) 8.6-50 MG tablet Take 2 tablets by mouth daily at 6 (six) AM. 05/04/23   Love, Evlyn Kanner, PA-C  ticagrelor (BRILINTA) 90 MG TABS tablet Take 0.5 tablets (45 mg total) by mouth 2 (two) times daily. 05/03/23   Love, Evlyn Kanner, PA-C  traMADol (ULTRAM) 50 MG tablet Take 1-2 tablets (50-100 mg total) by mouth every 6 (six) hours as needed. 05/03/23 05/02/24  Love, Evlyn Kanner, PA-C  traZODone (DESYREL) 50 MG tablet Take 0.5 tablets (25 mg total) by mouth at bedtime. Patient not taking: Reported  on 05/07/2023 05/03/23   Jacquelynn Cree, PA-C      Allergies    Amitriptyline, Duloxetine, Naproxen, Other, Beef-derived drug products, Pork-derived products, Darvon [propoxyphene], Hydrocodone, Lactose intolerance (gi), Latex, Oxycodone, Percocet [oxycodone-acetaminophen], and Topamax [topiramate]    Review of Systems   Review of Systems  Cardiovascular:  Positive for chest pain.    Physical Exam Updated Vital Signs BP 137/85   Pulse 86   Temp 98 F (36.7 C)   Resp 18   Ht 5\' 2"  (1.575 m)   Wt 68 kg   SpO2 99%   BMI 27.44 kg/m  Physical Exam Vitals and nursing note reviewed.  Cardiovascular:     Rate and Rhythm:  Tachycardia present.     Heart sounds: No murmur heard. Pulmonary:     Effort: Pulmonary effort is normal.  Abdominal:     General: There is no distension.     Palpations: Abdomen is soft.     Tenderness: There is abdominal tenderness (Tender right groin without swelling, disolcoration.).  Musculoskeletal:        General: Normal range of motion.  Neurological:     Comments: Aphasic, baseline per mom Decreased left facial sensation, baseline per mom.       ED Results / Procedures / Treatments   Labs (all labs ordered are listed, but only abnormal results are displayed) Labs Reviewed  BASIC METABOLIC PANEL - Abnormal; Notable for the following components:      Result Value   CO2 20 (*)    Glucose, Bld 109 (*)    All other components within normal limits  CBC - Abnormal; Notable for the following components:   Hemoglobin 11.7 (*)    HCT 34.9 (*)    All other components within normal limits  PROTIME-INR - Abnormal; Notable for the following components:   Prothrombin Time 17.5 (*)    INR 1.4 (*)    All other components within normal limits  TROPONIN I (HIGH SENSITIVITY) - Abnormal; Notable for the following components:   Troponin I (High Sensitivity) 25 (*)    All other components within normal limits  TROPONIN I (HIGH SENSITIVITY)   Results for orders placed or performed during the hospital encounter of 05/07/23  Basic metabolic panel   Collection Time: 05/07/23  9:45 PM  Result Value Ref Range   Sodium 136 135 - 145 mmol/L   Potassium 4.9 3.5 - 5.1 mmol/L   Chloride 104 98 - 111 mmol/L   CO2 20 (L) 22 - 32 mmol/L   Glucose, Bld 109 (H) 70 - 99 mg/dL   BUN 7 6 - 20 mg/dL   Creatinine, Ser 7.82 0.44 - 1.00 mg/dL   Calcium 9.2 8.9 - 95.6 mg/dL   GFR, Estimated >21 >30 mL/min   Anion gap 12 5 - 15  CBC   Collection Time: 05/07/23  9:45 PM  Result Value Ref Range   WBC 9.6 4.0 - 10.5 K/uL   RBC 3.99 3.87 - 5.11 MIL/uL   Hemoglobin 11.7 (L) 12.0 - 15.0 g/dL   HCT  86.5 (L) 78.4 - 46.0 %   MCV 87.5 80.0 - 100.0 fL   MCH 29.3 26.0 - 34.0 pg   MCHC 33.5 30.0 - 36.0 g/dL   RDW 69.6 29.5 - 28.4 %   Platelets 254 150 - 400 K/uL   nRBC 0.0 0.0 - 0.2 %  Protime-INR (order if Patient is taking Coumadin / Warfarin)   Collection Time: 05/07/23  9:45 PM  Result Value Ref Range   Prothrombin Time 17.5 (H) 11.4 - 15.2 seconds   INR 1.4 (H) 0.8 - 1.2  Troponin I (High Sensitivity)   Collection Time: 05/07/23  9:45 PM  Result Value Ref Range   Troponin I (High Sensitivity) 25 (H) <18 ng/L    EKG EKG Interpretation Date/Time:  Monday May 07 2023 21:42:37 EST Ventricular Rate:  120 PR Interval:  139 QRS Duration:  79 QT Interval:  321 QTC Calculation: 454 R Axis:   9  Text Interpretation: Sinus tachycardia Left ventricular hypertrophy Confirmed by Eber Hong ([id number]) on 05/08/2023 10:14:48 AM  Radiology DG Chest 2 View Result Date: 05/07/2023 CLINICAL DATA:  Chest pain EXAM: CHEST - 2 VIEW COMPARISON:  04/16/2023 FINDINGS: The heart size and mediastinal contours are within normal limits. Both lungs are clear. The visualized skeletal structures are unremarkable. IMPRESSION: No active cardiopulmonary disease. Electronically Signed   By: Helyn Numbers M.D.   On: 05/07/2023 22:58    Procedures Procedures    Medications Ordered in ED Medications - No data to display  ED Course/ Medical Decision Making/ A&P                                 Medical Decision Making This patient presents to the ED for concern of chest pain, this involves an extensive number of treatment options, and is a complaint that carries with it a high risk of complications and morbidity.  The differential diagnosis includes ACS, dissection, PTX, PNA, PE   Co morbidities that complicate the patient evaluation  Recent CVA, history DVT, HTN, HLD   Additional history obtained:  Additional history and/or information obtained from chart review, notable for Discharge  notation 05/03/23   Lab Tests:  I Ordered, and personally interpreted labs.  The pertinent results include:  Troponin 25 (flat --> 25, 27 on previous visit); no leukocytosis, Hgb 11.7. INR 1.4.     Imaging Studies ordered:  I ordered imaging studies including CXR I independently visualized and interpreted imaging which showed NAD I agree with the radiologist interpretation   Cardiac Monitoring:  The patient was maintained on a cardiac monitor.  I personally viewed and interpreted the cardiac monitored which showed an underlying rhythm of: Sinus tachycardia   Consultations Obtained:  I requested consultation with the ED attending, Dr. Deretha Emory,  and discussed lab and imaging findings as well as pertinent plan - they recommend: can stop hydralazine with close follow up with PCP, and refer to cardiology as well    Problem List / ED Course:  Recurrent chest pain and tachycardia after receiving hydralazine   Reevaluation:  After the interventions noted above, I reevaluated the patient and found that they have :improved  Disposition:  After consideration of the diagnostic results and the patients response to treatment, I feel that the patient would benefit from discharge home.  Discussed hydralazine with parents who feel symptoms recur when this medication is given, both in the morning and evening.but evening is worse.   On recheck, heart rate has normalized and the patient appears more comfortable. Parents are ready to take her home. Advised they can stop the hydralazine for now but with close follow up with PCP for recheck of BP. Referral to cardiology also provided.    Amount and/or Complexity of Data Reviewed Labs: ordered. Radiology: ordered.           Final Clinical Impression(s) / ED Diagnoses  Final diagnoses:  Nonspecific chest pain  Adverse effect of drug, initial encounter    Rx / DC Orders ED Discharge Orders     None         Elpidio Anis,  PA-C 05/08/23 2248    Vanetta Mulders, MD 05/09/23 1110

## 2023-05-08 NOTE — ED Provider Notes (Incomplete)
[place] [facility] Provider Note   CSN: [id number] Arrival date & time: 05/07/23  2128     History {Add pertinent medical, surgical, social history, OB history to HPI:1} Chief Complaint  Patient presents with  . Chest Pain  . Groin Pain    Brittany [name] is a 58 y.o. female.  The history is provided by a parent. No language interpreter was used.  Chest Pain Groin Pain Associated symptoms include chest pain.       Home Medications Prior to Admission medications   Medication Sig Start Date End Date Taking? Authorizing Provider  amLODipine (NORVASC) 10 MG tablet Take 1 tablet (10 mg total) by mouth daily. 05/03/23 06/02/23  Love, Evlyn Kanner, PA-C  apixaban (ELIQUIS) 5 MG TABS tablet Take 1 tablet (5 mg total) by mouth 2 (two) times daily. 05/03/23   Love, Evlyn Kanner, PA-C  atorvastatin (LIPITOR) 80 MG tablet Take 1 tablet (80 mg total) by mouth daily with supper. 05/03/23 05/02/24  Jacquelynn Cree, PA-C  cholecalciferol (CHOLECALCIFEROL) 25 MCG tablet Take 1 tablet (1,000 Units total) by mouth daily. 04/21/23   Lynnae January, NP  dicyclomine (BENTYL) 10 MG capsule Take 1 capsule (10 mg total) by mouth 3 (three) times daily before meals. 05/03/23   Love, Evlyn Kanner, PA-C  docusate sodium (COLACE) 100 MG capsule Take 1 capsule (100 mg total) by mouth 2 (two) times daily. 05/03/23   Love, Evlyn Kanner, PA-C  ezetimibe (ZETIA) 10 MG tablet Take 1 tablet (10 mg total) by mouth daily. 05/03/23   Love, Evlyn Kanner, PA-C  gabapentin (NEURONTIN) 300 MG capsule Take 1 capsule (300 mg total) by mouth 3 (three) times daily. Patient taking differently: Take 300 mg by mouth 2 (two) times daily. 05/03/23   Love, Evlyn Kanner, PA-C  hydrALAZINE (APRESOLINE) 50 MG tablet Take 1 tablet (50 mg total) by mouth every 8 (eight) hours. 12/09/22 04/12/23  Arrien, York Ram, MD  linaclotide [facility]) 72 MCG capsule Take 72 mcg by mouth every 3 (three) days.    [provider]  lisinopril (ZESTRIL) 5 MG tablet Take 1 tablet (5 mg total) by mouth daily. 05/07/23   Clayborne Dana, NP  pantoprazole (PROTONIX) 40 MG tablet Take 1 tablet (40 mg total) by mouth daily. 05/04/23   Love, Evlyn Kanner, PA-C  potassium chloride (KLOR-CON 10) 10 MEQ tablet Take 2 tablets (20 mEq total) by mouth 2 (two) times daily. 05/07/23   Clayborne Dana, NP  senna-docusate (SENOKOT-S) 8.6-50 MG tablet Take 2 tablets by mouth daily at 6 (six) AM. 05/04/23   Love, Evlyn Kanner, PA-C  ticagrelor (BRILINTA) 90 MG TABS tablet Take 0.5 tablets (45 mg total) by mouth 2 (two) times daily. 05/03/23   Love, Evlyn Kanner, PA-C  traMADol (ULTRAM) 50 MG tablet Take 1-2 tablets (50-100 mg total) by mouth every 6 (six) hours as needed. 05/03/23 05/02/24  Love, Evlyn Kanner, PA-C  traZODone (DESYREL) 50 MG tablet Take 0.5 tablets (25 mg total) by mouth at bedtime. Patient not taking: Reported on 05/07/2023 05/03/23   Jacquelynn Cree, PA-C      Allergies    Amitriptyline, Duloxetine, Naproxen, Other, Beef-derived drug products, Pork-derived products, Darvon [propoxyphene], Hydrocodone, Lactose intolerance (gi), Latex, Oxycodone, Percocet [oxycodone-acetaminophen], and Topamax [topiramate]    Review of Systems   Review of Systems  Cardiovascular:  Positive for chest pain.    Physical Exam Updated Vital Signs BP (!) 153/75   Pulse 86  Temp 98 F (36.7 C)   Resp 18   Ht 5\' 2"  (1.575 m)   Wt 68 kg   SpO2 99%   BMI 27.44 kg/m  Physical Exam  ED Results / Procedures / Treatments   Labs (all labs ordered are listed, but only abnormal results are displayed) Labs Reviewed  BASIC METABOLIC PANEL - Abnormal; Notable for the following components:      Result Value   CO2 20 (*)    Glucose, Bld 109 (*)    All other components within normal limits  CBC - Abnormal; Notable for the following components:   Hemoglobin 11.7 (*)    HCT 34.9 (*)    All other components within normal limits  PROTIME-INR - Abnormal; Notable for  the following components:   Prothrombin Time 17.5 (*)    INR 1.4 (*)    All other components within normal limits  TROPONIN I (HIGH SENSITIVITY) - Abnormal; Notable for the following components:   Troponin I (High Sensitivity) 25 (*)    All other components within normal limits  TROPONIN I (HIGH SENSITIVITY)    EKG None  Radiology DG Chest 2 View Result Date: 05/07/2023 CLINICAL DATA:  Chest pain EXAM: CHEST - 2 VIEW COMPARISON:  04/16/2023 FINDINGS: The heart size and mediastinal contours are within normal limits. Both lungs are clear. The visualized skeletal structures are unremarkable. IMPRESSION: No active cardiopulmonary disease. Electronically Signed   By: Helyn Numbers M.D.   On: 05/07/2023 22:58    Procedures Procedures  {Document cardiac monitor, telemetry assessment procedure when appropriate:1}  Medications Ordered in ED Medications - No data to display  ED Course/ Medical Decision Making/ A&P   {   Click here for ABCD2, HEART and other calculatorsREFRESH Note before signing :1}                              Medical Decision Making Amount and/or Complexity of Data Reviewed Labs: ordered. Radiology: ordered.   ***  {Document critical care time when appropriate:1} {Document review of labs and clinical decision tools ie heart score, Chads2Vasc2 etc:1}  {Document your independent review of radiology images, and any outside records:1} {Document your discussion with family members, caretakers, and with consultants:1} {Document social determinants of health affecting pt's care:1} {Document your decision making why or why not admission, treatments were needed:1} Final Clinical Impression(s) / ED Diagnoses Final diagnoses:  None    Rx / DC Orders ED Discharge Orders     None

## 2023-05-08 NOTE — ED Notes (Signed)
 ED Provider at bedside.

## 2023-05-08 NOTE — Discharge Instructions (Signed)
You can stop giving the Hydralazine for now. Continue the Amlodipine as instructed by Hyman Hopes.   Call [facility] to schedule an appointment to be seen for further evaluation and management of blood pressure.   Take blood pressure measurements 2 times a day, at the same time everyday. Keep a log of the results. Follow up closely with Mr. Reola Calkins and get in to be seen by cardiology as soon as an appointment can be made.

## 2023-05-09 ENCOUNTER — Encounter (HOSPITAL_COMMUNITY): Payer: Self-pay

## 2023-05-09 ENCOUNTER — Emergency Department (HOSPITAL_COMMUNITY): Payer: Medicaid Other

## 2023-05-09 ENCOUNTER — Emergency Department (HOSPITAL_COMMUNITY)
Admission: EM | Admit: 2023-05-09 | Discharge: 2023-05-09 | Disposition: A | Discharge status: Home / Self Care | Payer: Medicaid Other | Attending: Emergency Medicine | Admitting: Emergency Medicine

## 2023-05-09 ENCOUNTER — Other Ambulatory Visit: Payer: Self-pay

## 2023-05-09 DIAGNOSIS — E785 Hyperlipidemia, unspecified: Secondary | ICD-10-CM | POA: Diagnosis not present

## 2023-05-09 DIAGNOSIS — K219 Gastro-esophageal reflux disease without esophagitis: Secondary | ICD-10-CM | POA: Diagnosis not present

## 2023-05-09 DIAGNOSIS — R1084 Generalized abdominal pain: Secondary | ICD-10-CM | POA: Diagnosis not present

## 2023-05-09 DIAGNOSIS — E7800 Pure hypercholesterolemia, unspecified: Secondary | ICD-10-CM | POA: Diagnosis not present

## 2023-05-09 DIAGNOSIS — N179 Acute kidney failure, unspecified: Secondary | ICD-10-CM | POA: Diagnosis not present

## 2023-05-09 DIAGNOSIS — N8110 Cystocele, unspecified: Secondary | ICD-10-CM | POA: Diagnosis not present

## 2023-05-09 DIAGNOSIS — H5347 Heteronymous bilateral field defects: Secondary | ICD-10-CM | POA: Diagnosis not present

## 2023-05-09 DIAGNOSIS — M1990 Unspecified osteoarthritis, unspecified site: Secondary | ICD-10-CM | POA: Diagnosis not present

## 2023-05-09 DIAGNOSIS — E876 Hypokalemia: Secondary | ICD-10-CM | POA: Diagnosis not present

## 2023-05-09 DIAGNOSIS — M62838 Other muscle spasm: Secondary | ICD-10-CM | POA: Diagnosis not present

## 2023-05-09 DIAGNOSIS — I69320 Aphasia following cerebral infarction: Secondary | ICD-10-CM | POA: Diagnosis not present

## 2023-05-09 DIAGNOSIS — R1031 Right lower quadrant pain: Secondary | ICD-10-CM | POA: Diagnosis not present

## 2023-05-09 DIAGNOSIS — I82411 Acute embolism and thrombosis of right femoral vein: Secondary | ICD-10-CM | POA: Diagnosis not present

## 2023-05-09 DIAGNOSIS — M24111 Other articular cartilage disorders, right shoulder: Secondary | ICD-10-CM | POA: Diagnosis not present

## 2023-05-09 DIAGNOSIS — I700 Atherosclerosis of aorta: Secondary | ICD-10-CM | POA: Diagnosis not present

## 2023-05-09 DIAGNOSIS — N939 Abnormal uterine and vaginal bleeding, unspecified: Secondary | ICD-10-CM | POA: Insufficient documentation

## 2023-05-09 DIAGNOSIS — I119 Hypertensive heart disease without heart failure: Secondary | ICD-10-CM | POA: Diagnosis not present

## 2023-05-09 DIAGNOSIS — K449 Diaphragmatic hernia without obstruction or gangrene: Secondary | ICD-10-CM | POA: Diagnosis not present

## 2023-05-09 DIAGNOSIS — F419 Anxiety disorder, unspecified: Secondary | ICD-10-CM | POA: Diagnosis not present

## 2023-05-09 DIAGNOSIS — R109 Unspecified abdominal pain: Secondary | ICD-10-CM

## 2023-05-09 DIAGNOSIS — I69351 Hemiplegia and hemiparesis following cerebral infarction affecting right dominant side: Secondary | ICD-10-CM | POA: Diagnosis not present

## 2023-05-09 DIAGNOSIS — M67432 Ganglion, left wrist: Secondary | ICD-10-CM | POA: Diagnosis not present

## 2023-05-09 DIAGNOSIS — I69398 Other sequelae of cerebral infarction: Secondary | ICD-10-CM | POA: Diagnosis not present

## 2023-05-09 DIAGNOSIS — R7303 Prediabetes: Secondary | ICD-10-CM | POA: Diagnosis not present

## 2023-05-09 DIAGNOSIS — E538 Deficiency of other specified B group vitamins: Secondary | ICD-10-CM | POA: Diagnosis not present

## 2023-05-09 DIAGNOSIS — M65312 Trigger thumb, left thumb: Secondary | ICD-10-CM | POA: Diagnosis not present

## 2023-05-09 DIAGNOSIS — D649 Anemia, unspecified: Secondary | ICD-10-CM | POA: Diagnosis not present

## 2023-05-09 DIAGNOSIS — G629 Polyneuropathy, unspecified: Secondary | ICD-10-CM | POA: Diagnosis not present

## 2023-05-09 LAB — BASIC METABOLIC PANEL
Anion gap: 13 (ref 5–15)
BUN: 6 mg/dL (ref 6–20)
CO2: 22 mmol/L (ref 22–32)
Calcium: 9.9 mg/dL (ref 8.9–10.3)
Chloride: 105 mmol/L (ref 98–111)
Creatinine, Ser: 1.08 mg/dL — ABNORMAL HIGH (ref 0.44–1.00)
GFR, Estimated: 60 mL/min — ABNORMAL LOW (ref >60)
Glucose, Bld: 97 mg/dL (ref 70–99)
Potassium: 3.9 mmol/L (ref 3.5–5.1)
Sodium: 140 mmol/L (ref 135–145)

## 2023-05-09 LAB — URINALYSIS, ROUTINE W REFLEX MICROSCOPIC
Bilirubin Urine: NEGATIVE
Glucose, UA: NEGATIVE mg/dL
Hgb urine dipstick: NEGATIVE
Ketones, ur: NEGATIVE mg/dL
Leukocytes,Ua: NEGATIVE
Nitrite: NEGATIVE
Protein, ur: NEGATIVE mg/dL
Specific Gravity, Urine: 1.006 (ref 1.005–1.030)
pH: 6 (ref 5.0–8.0)

## 2023-05-09 LAB — CBC WITH DIFFERENTIAL/PLATELET
Abs Immature Granulocytes: 0.02 10*3/uL (ref 0.00–0.07)
Basophils Absolute: 0 10*3/uL (ref 0.0–0.1)
Basophils Relative: 0 %
Eosinophils Absolute: 0 10*3/uL (ref 0.0–0.5)
Eosinophils Relative: 0 %
HCT: 38.1 % (ref 36.0–46.0)
Hemoglobin: 12.9 g/dL (ref 12.0–15.0)
Immature Granulocytes: 0 %
Lymphocytes Relative: 33 %
Lymphs Abs: 2.7 10*3/uL (ref 0.7–4.0)
MCH: 29.6 pg (ref 26.0–34.0)
MCHC: 33.9 g/dL (ref 30.0–36.0)
MCV: 87.4 fL (ref 80.0–100.0)
Monocytes Absolute: 0.8 10*3/uL (ref 0.1–1.0)
Monocytes Relative: 9 %
Neutro Abs: 4.8 10*3/uL (ref 1.7–7.7)
Neutrophils Relative %: 58 %
Platelets: 272 10*3/uL (ref 150–400)
RBC: 4.36 MIL/uL (ref 3.87–5.11)
RDW: 14.1 % (ref 11.5–15.5)
WBC: 8.3 10*3/uL (ref 4.0–10.5)
nRBC: 0 % (ref 0.0–0.2)

## 2023-05-09 LAB — PREGNANCY, URINE: Preg Test, Ur: NEGATIVE

## 2023-05-09 LAB — HCG, SERUM, QUALITATIVE: Preg, Serum: NEGATIVE

## 2023-05-09 MED ORDER — ACETAMINOPHEN 500 MG PO TABS
1000.0000 mg | ORAL_TABLET | Freq: Once | ORAL | Status: AC
  Administered 2023-05-09: 1000 mg via ORAL
  Filled 2023-05-09: qty 2

## 2023-05-09 NOTE — ED Triage Notes (Addendum)
Pt here for vaginal bleeding and abd pain for 3 days. Pt c/o facial numbness and dizziness. Pt states it is painful to urinate.

## 2023-05-09 NOTE — ED Provider Triage Note (Signed)
Emergency Medicine Provider Triage Evaluation Note  Brittany [name] , a 58 y.o. female  was evaluated in triage.  Pt complains of suprapubic pain, dysuria, possible vaginal bleeding.  Patient has had a previous stroke, speech is noted to be baseline per mother who called our staff.  She is tearful during my exam, not fully forthcoming but points to her pelvic area.  She nods her head that it is uncomfortable when she pees.  She cannot tell if she is having blood in the urine or vaginal bleeding, status post tubal ligation.  Difficult historian  Review of Systems  Positive: Dysuria, suprapubic/pelvic pain Negative: Fever, back pain  Physical Exam  BP (!) 161/93 (BP Location: Right Arm)   Pulse 88   Temp 98.6 F (37 C) (Oral)   Resp 19   SpO2 99%  Gen:   Awake, tearful Resp:  Normal effort  MSK:   Moves extremities without difficulty    Medical Decision Making  Medically screening exam initiated at 12:42 PM.  Appropriate orders placed.  Brittany [name] was informed that the remainder of the evaluation will be completed by another provider, this initial triage assessment does not replace that evaluation, and the importance of remaining in the ED until their evaluation is complete.  Patient evaluated sitting up in chair, fully clothed, limited PE. Orders placed. Patient was counseled that they need to remain in the ED until the completion of their work-up including a full H&P and results of any tests.  The patient appears stable and the remainder of the encounter may be completed by another provider.   Rozelle Logan, DO 05/09/23 1245
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
## 2023-05-10 ENCOUNTER — Telehealth: Payer: Self-pay

## 2023-05-10 ENCOUNTER — Telehealth: Payer: Self-pay | Admitting: Family Medicine

## 2023-05-10 NOTE — Telephone Encounter (Signed)
Copied from CRM [id number]. Topic: Clinical - Medical Advice >> May 10, 2023  9:47 AM Alcus Dad wrote: Reason for CRM: Physical Therapist Victorino Dike from [facility] stated that patient is canceling her appt. Per mother's request. Patient has been in and out of the ER this week and patient needed some rest. Please reschedule for Next Week

## 2023-05-10 NOTE — Telephone Encounter (Signed)
 Just an [place]

## 2023-05-10 NOTE — Telephone Encounter (Signed)
Was this just an FYI or are they needing new orders? I thought patient said this was already set up at hospital/rehab discharge.

## 2023-05-10 NOTE — Telephone Encounter (Signed)
Pt has appt scheduled 05/21/23

## 2023-05-10 NOTE — Telephone Encounter (Signed)
Copied from CRM [id number]. Topic: Clinical - Home Health Verbal Orders >> May 10, 2023 10:00 AM Myrtice Lauth wrote: Caller/Agency: christy, with [facility] Callback Number: [phone] Service Requested: Speech Therapy Frequency: 1 week 8,   Any new concerns about the patient? Yes the order is to address aphasia, Disphasia, called to advise pt went to the er yesterday due to blood pressure. Pt blood pressure 182/82 the  sent pt to er to rule out stroke.

## 2023-05-11 ENCOUNTER — Telehealth: Payer: Self-pay | Admitting: Neurology

## 2023-05-11 NOTE — Telephone Encounter (Signed)
[employer] forms completed. Copy sent to scan, copy to hold. Faxed to number provided with confirmation received.

## 2023-05-14 ENCOUNTER — Other Ambulatory Visit (HOSPITAL_COMMUNITY): Payer: Self-pay | Admitting: Radiology

## 2023-05-14 ENCOUNTER — Telehealth: Payer: Self-pay | Admitting: Neurology

## 2023-05-14 NOTE — Telephone Encounter (Signed)
 Called and LVM giving okay for ST.   Copied from CRM [id number]. Topic: Clinical - Home Health Verbal Orders >> May 14, 2023  3:06 PM Marica Otter wrote: Caller/Agency: Christy/[facility] Callback Number: [phone] Secure line Service Requested: Speech Therapy Frequency: 1 time a week for 8 weeks Any new concerns about the patient? No

## 2023-05-15 ENCOUNTER — Ambulatory Visit (HOSPITAL_COMMUNITY)
Admission: RE | Admit: 2023-05-15 | Discharge: 2023-05-15 | Disposition: A | Discharge status: Home / Self Care | Payer: Medicaid Other | Source: Ambulatory Visit | Attending: Interventional Radiology | Admitting: Interventional Radiology

## 2023-05-15 ENCOUNTER — Telehealth: Payer: Self-pay | Admitting: Family Medicine

## 2023-05-15 NOTE — Telephone Encounter (Signed)
 Copied from CRM [id number]. Topic: Medical Record Request - Provider/Facility Request >> May 15, 2023  4:12 PM [place] J wrote: Reason for CRM: Patients case manager Dois Davenport from [employer] in for an update on faxes that were sent on 05/08/2023. She would like for forms to be faxed back to [phone].

## 2023-05-16 ENCOUNTER — Telehealth: Payer: Self-pay | Admitting: Neurology

## 2023-05-16 DIAGNOSIS — I82411 Acute embolism and thrombosis of right femoral vein: Secondary | ICD-10-CM | POA: Diagnosis not present

## 2023-05-16 DIAGNOSIS — E785 Hyperlipidemia, unspecified: Secondary | ICD-10-CM | POA: Diagnosis not present

## 2023-05-16 DIAGNOSIS — I119 Hypertensive heart disease without heart failure: Secondary | ICD-10-CM | POA: Diagnosis not present

## 2023-05-16 DIAGNOSIS — M24111 Other articular cartilage disorders, right shoulder: Secondary | ICD-10-CM | POA: Diagnosis not present

## 2023-05-16 DIAGNOSIS — G629 Polyneuropathy, unspecified: Secondary | ICD-10-CM | POA: Diagnosis not present

## 2023-05-16 DIAGNOSIS — E538 Deficiency of other specified B group vitamins: Secondary | ICD-10-CM | POA: Diagnosis not present

## 2023-05-16 DIAGNOSIS — I69351 Hemiplegia and hemiparesis following cerebral infarction affecting right dominant side: Secondary | ICD-10-CM | POA: Diagnosis not present

## 2023-05-16 DIAGNOSIS — N8110 Cystocele, unspecified: Secondary | ICD-10-CM | POA: Diagnosis not present

## 2023-05-16 DIAGNOSIS — M67432 Ganglion, left wrist: Secondary | ICD-10-CM | POA: Diagnosis not present

## 2023-05-16 DIAGNOSIS — I69320 Aphasia following cerebral infarction: Secondary | ICD-10-CM | POA: Diagnosis not present

## 2023-05-16 DIAGNOSIS — I10 Essential (primary) hypertension: Secondary | ICD-10-CM | POA: Diagnosis not present

## 2023-05-16 DIAGNOSIS — D649 Anemia, unspecified: Secondary | ICD-10-CM | POA: Diagnosis not present

## 2023-05-16 DIAGNOSIS — R3981 Functional urinary incontinence: Secondary | ICD-10-CM | POA: Diagnosis not present

## 2023-05-16 DIAGNOSIS — I69398 Other sequelae of cerebral infarction: Secondary | ICD-10-CM | POA: Diagnosis not present

## 2023-05-16 DIAGNOSIS — I639 Cerebral infarction, unspecified: Secondary | ICD-10-CM | POA: Diagnosis not present

## 2023-05-16 DIAGNOSIS — N179 Acute kidney failure, unspecified: Secondary | ICD-10-CM | POA: Diagnosis not present

## 2023-05-16 DIAGNOSIS — E7800 Pure hypercholesterolemia, unspecified: Secondary | ICD-10-CM | POA: Diagnosis not present

## 2023-05-16 DIAGNOSIS — M62838 Other muscle spasm: Secondary | ICD-10-CM | POA: Diagnosis not present

## 2023-05-16 DIAGNOSIS — K449 Diaphragmatic hernia without obstruction or gangrene: Secondary | ICD-10-CM | POA: Diagnosis not present

## 2023-05-16 DIAGNOSIS — H5347 Heteronymous bilateral field defects: Secondary | ICD-10-CM | POA: Diagnosis not present

## 2023-05-16 DIAGNOSIS — I700 Atherosclerosis of aorta: Secondary | ICD-10-CM | POA: Diagnosis not present

## 2023-05-16 DIAGNOSIS — M65312 Trigger thumb, left thumb: Secondary | ICD-10-CM | POA: Diagnosis not present

## 2023-05-16 DIAGNOSIS — E876 Hypokalemia: Secondary | ICD-10-CM | POA: Diagnosis not present

## 2023-05-16 DIAGNOSIS — F419 Anxiety disorder, unspecified: Secondary | ICD-10-CM | POA: Diagnosis not present

## 2023-05-16 DIAGNOSIS — M1990 Unspecified osteoarthritis, unspecified site: Secondary | ICD-10-CM | POA: Diagnosis not present

## 2023-05-16 DIAGNOSIS — R7303 Prediabetes: Secondary | ICD-10-CM | POA: Diagnosis not present

## 2023-05-16 DIAGNOSIS — K219 Gastro-esophageal reflux disease without esophagitis: Secondary | ICD-10-CM | POA: Diagnosis not present

## 2023-05-16 NOTE — Telephone Encounter (Signed)
 Faxed forms back on 05/11/2023 to the provided number with confirmation received.

## 2023-05-16 NOTE — Telephone Encounter (Signed)
 Copied from CRM [id number]. Topic: Clinical - Home Health Verbal Orders >> May 16, 2023  2:30 PM Isabell A wrote: Caller/Agency: Clydie Braun from [facility] Callback Number: [phone] Service Requested: Occupational Therapy Frequency:  Any new concerns about the patient? FYI:  OT did one time visit only -Pt is close to being independent with self care.

## 2023-05-17 DIAGNOSIS — I6930 Unspecified sequelae of cerebral infarction: Secondary | ICD-10-CM | POA: Diagnosis not present

## 2023-05-17 NOTE — Telephone Encounter (Signed)
 LVM with Clydie Braun giving verbal order for occupational therapy.

## 2023-05-18 ENCOUNTER — Telehealth: Payer: Self-pay | Admitting: Neurology

## 2023-05-18 DIAGNOSIS — I6930 Unspecified sequelae of cerebral infarction: Secondary | ICD-10-CM | POA: Diagnosis not present

## 2023-05-18 NOTE — Telephone Encounter (Signed)
 Called Dois Davenport and let her know this was faxed on 05/11/2023, she states they did not receive it. Refaxed to number provided and to a new number given [phone].   Copied from CRM [id number]. Topic: Clinical - Home Health Verbal Orders >> May 18, 2023  3:50 PM Sonny Dandy B wrote: Caller/Agency:[facility]  Callback Number:[phone] Service Requested: personal care  Frequency:  Any new concerns about the patient? No called regarding personal care form sent on 05/08/23 they are requesting the form be filled out and sent back. Faxed to [phone], need most recent offices notes as well.

## 2023-05-18 NOTE — Telephone Encounter (Signed)
 Noted.   Copied from CRM [id number]. Topic: Clinical - Home Health Verbal Orders >> May 18, 2023  2:04 PM Isabell A wrote: Caller/Agency: Wilkie Aye from [facility]  Callback Number: [phone] Service Requested: Speech Therapy Frequency: N/A Any new concerns about the patient? Yes, calling to report missed speech therapist visit - pt is moving from old house to new house. Wilkie Aye will see her 3/6.

## 2023-05-21 ENCOUNTER — Ambulatory Visit: Payer: Medicaid Other | Admitting: Family Medicine

## 2023-05-23 ENCOUNTER — Ambulatory Visit (HOSPITAL_COMMUNITY)
Admission: RE | Admit: 2023-05-23 | Discharge: 2023-05-23 | Disposition: A | Discharge status: Home / Self Care | Payer: Medicaid Other | Source: Ambulatory Visit | Attending: Interventional Radiology | Admitting: Interventional Radiology

## 2023-05-24 DIAGNOSIS — I69351 Hemiplegia and hemiparesis following cerebral infarction affecting right dominant side: Secondary | ICD-10-CM | POA: Diagnosis not present

## 2023-05-24 DIAGNOSIS — G629 Polyneuropathy, unspecified: Secondary | ICD-10-CM | POA: Diagnosis not present

## 2023-05-24 DIAGNOSIS — N179 Acute kidney failure, unspecified: Secondary | ICD-10-CM | POA: Diagnosis not present

## 2023-05-24 DIAGNOSIS — E538 Deficiency of other specified B group vitamins: Secondary | ICD-10-CM | POA: Diagnosis not present

## 2023-05-24 DIAGNOSIS — M65312 Trigger thumb, left thumb: Secondary | ICD-10-CM | POA: Diagnosis not present

## 2023-05-24 DIAGNOSIS — E876 Hypokalemia: Secondary | ICD-10-CM | POA: Diagnosis not present

## 2023-05-24 DIAGNOSIS — N8110 Cystocele, unspecified: Secondary | ICD-10-CM | POA: Diagnosis not present

## 2023-05-24 DIAGNOSIS — I69320 Aphasia following cerebral infarction: Secondary | ICD-10-CM | POA: Diagnosis not present

## 2023-05-24 DIAGNOSIS — D649 Anemia, unspecified: Secondary | ICD-10-CM | POA: Diagnosis not present

## 2023-05-24 DIAGNOSIS — M67432 Ganglion, left wrist: Secondary | ICD-10-CM | POA: Diagnosis not present

## 2023-05-24 DIAGNOSIS — H5347 Heteronymous bilateral field defects: Secondary | ICD-10-CM | POA: Diagnosis not present

## 2023-05-24 DIAGNOSIS — I82411 Acute embolism and thrombosis of right femoral vein: Secondary | ICD-10-CM | POA: Diagnosis not present

## 2023-05-24 DIAGNOSIS — I700 Atherosclerosis of aorta: Secondary | ICD-10-CM | POA: Diagnosis not present

## 2023-05-24 DIAGNOSIS — E785 Hyperlipidemia, unspecified: Secondary | ICD-10-CM | POA: Diagnosis not present

## 2023-05-24 DIAGNOSIS — R7303 Prediabetes: Secondary | ICD-10-CM | POA: Diagnosis not present

## 2023-05-24 DIAGNOSIS — M1990 Unspecified osteoarthritis, unspecified site: Secondary | ICD-10-CM | POA: Diagnosis not present

## 2023-05-24 DIAGNOSIS — I119 Hypertensive heart disease without heart failure: Secondary | ICD-10-CM | POA: Diagnosis not present

## 2023-05-24 DIAGNOSIS — K449 Diaphragmatic hernia without obstruction or gangrene: Secondary | ICD-10-CM | POA: Diagnosis not present

## 2023-05-24 DIAGNOSIS — I69398 Other sequelae of cerebral infarction: Secondary | ICD-10-CM | POA: Diagnosis not present

## 2023-05-24 DIAGNOSIS — M24111 Other articular cartilage disorders, right shoulder: Secondary | ICD-10-CM | POA: Diagnosis not present

## 2023-05-24 DIAGNOSIS — F419 Anxiety disorder, unspecified: Secondary | ICD-10-CM | POA: Diagnosis not present

## 2023-05-24 DIAGNOSIS — K219 Gastro-esophageal reflux disease without esophagitis: Secondary | ICD-10-CM | POA: Diagnosis not present

## 2023-05-24 DIAGNOSIS — M62838 Other muscle spasm: Secondary | ICD-10-CM | POA: Diagnosis not present

## 2023-05-24 DIAGNOSIS — E7800 Pure hypercholesterolemia, unspecified: Secondary | ICD-10-CM | POA: Diagnosis not present

## 2023-05-25 ENCOUNTER — Other Ambulatory Visit: Payer: Self-pay | Admitting: Family Medicine

## 2023-05-25 DIAGNOSIS — I10 Essential (primary) hypertension: Secondary | ICD-10-CM

## 2023-05-25 NOTE — Telephone Encounter (Signed)
 Copied from CRM [id number]. Topic: Clinical - Medication Refill >> May 25, 2023 12:04 PM [place] E wrote: Most Recent Primary Care Visit:  Provider: Clayborne Dana  Department: [facility]  Visit Type: OFFICE VISIT  Date: 05/07/2023  Medication: amLODipine (NORVASC) 10 MG tablet apixaban (ELIQUIS) 5 MG TABS tablet atorvastatin (LIPITOR) 80 MG tablet dicyclomine (BENTYL) 10 MG capsule ticagrelor (BRILINTA) 90 MG TABS tablet ezetimibe (ZETIA) 10 MG tablet                     Has the patient contacted their pharmacy? Yes (Agent: If no, request that the patient contact the pharmacy for the refill. If patient does not wish to contact the pharmacy document the reason why and proceed with request.) (Agent: If yes, when and what did the pharmacy advise?)  Is this the correct pharmacy for this prescription? Yes If no, delete pharmacy and type the correct one.  This is the patient's preferred pharmacy:  [facility] - [place], [place] - [address] [place] [address] Phone: [phone] Fax: [phone]   Has the prescription been filled recently? No  Is the patient out of the medication? Yes, patient's mother stated that she is out of these medications.  Has the patient been seen for an appointment in the last year OR does the patient have an upcoming appointment? Yes  Can we respond through MyChart? Yes  Agent: Please be advised that Rx refills may take up to 3 business days. We ask that you follow-up with your pharmacy.

## 2023-05-28 ENCOUNTER — Encounter: Payer: Self-pay | Admitting: Family Medicine

## 2023-05-28 ENCOUNTER — Ambulatory Visit (INDEPENDENT_AMBULATORY_CARE_PROVIDER_SITE_OTHER): Payer: Medicaid Other | Admitting: Family Medicine

## 2023-05-28 VITALS — BP 140/68 | HR 92 | Ht 62.0 in | Wt 150.0 lb

## 2023-05-28 DIAGNOSIS — R310 Gross hematuria: Secondary | ICD-10-CM

## 2023-05-28 DIAGNOSIS — R748 Abnormal levels of other serum enzymes: Secondary | ICD-10-CM | POA: Diagnosis not present

## 2023-05-28 DIAGNOSIS — I10 Essential (primary) hypertension: Secondary | ICD-10-CM | POA: Diagnosis not present

## 2023-05-28 LAB — POC URINALSYSI DIPSTICK (AUTOMATED)
Bilirubin, UA: NEGATIVE
Blood, UA: NEGATIVE
Glucose, UA: NEGATIVE
Ketones, UA: NEGATIVE
Leukocytes, UA: NEGATIVE
Nitrite, UA: NEGATIVE
Protein, UA: NEGATIVE
Spec Grav, UA: <=1.005 — AB (ref 1.010–1.025)
Urobilinogen, UA: 0.2 U/dL
pH, UA: 5 (ref 5.0–8.0)

## 2023-05-28 MED ORDER — TICAGRELOR 90 MG PO TABS: 45.0000 mg | ORAL_TABLET | Freq: Two times a day (BID) | ORAL | 0 refills | Status: DC

## 2023-05-28 MED ORDER — EZETIMIBE 10 MG PO TABS: 10.0000 mg | ORAL_TABLET | Freq: Every day | ORAL | 0 refills | Status: DC

## 2023-05-28 MED ORDER — APIXABAN 5 MG PO TABS: 5.0000 mg | ORAL_TABLET | Freq: Two times a day (BID) | ORAL | 0 refills | Status: DC

## 2023-05-28 MED ORDER — AMLODIPINE BESYLATE 10 MG PO TABS
10.0000 mg | ORAL_TABLET | Freq: Every day | ORAL | 0 refills | Status: DC
Start: 2023-05-28 — End: 2023-07-23

## 2023-05-28 MED ORDER — LISINOPRIL 2.5 MG PO TABS: 2.5000 mg | ORAL_TABLET | Freq: Two times a day (BID) | ORAL | 1 refills | Status: DC

## 2023-05-28 MED ORDER — DICYCLOMINE HCL 10 MG PO CAPS: 10.0000 mg | ORAL_CAPSULE | Freq: Three times a day (TID) | ORAL | 0 refills | Status: DC

## 2023-05-28 MED ORDER — ATORVASTATIN CALCIUM 80 MG PO TABS: 80.0000 mg | ORAL_TABLET | Freq: Every day | ORAL | 0 refills | Status: DC

## 2023-05-28 NOTE — Assessment & Plan Note (Signed)
 Blood pressure is at goal for age and co-morbidities.   Recommendations: amlodipine 10 mg daily, lisinopril 5 mg daily, hydralazine 50 mg q8h - BP goal <130/80 - monitor and log blood pressures at home - check around the same time each day in a relaxed setting - Limit salt to <2000 mg/day - Follow DASH eating plan (heart healthy diet) - limit alcohol to 2 standard drinks per day for men and 1 per day for women - avoid tobacco products - get at least 2 hours of regular aerobic exercise weekly Patient aware of signs/symptoms requiring further/urgent evaluation. Labs updated today.

## 2023-05-28 NOTE — Progress Notes (Signed)
 Established Patient Office Visit    Subjective   Patient ID: Brittany [name], female    DOB: Jul 31, 1965  Age: 58 y.o. MRN: [id number]  Chief Complaint  Patient presents with   Medical Management of Chronic Issues   Hypertension      Patient is here for blood pressure follow-up.    Hypertension: - Medications: amlodipine 10 mg daily, lisinopril 5 mg daily, hydralazine 50 mg q8h - Compliance: good - Checking BP at home: 120s/70s - Denies any SOB, recurrent headaches, CP, vision changes, LE edema, dizziness, palpitations, or medication side effects.   Wt Readings from Last 3 Encounters:  05/28/23 150 lb (68 kg)  05/07/23 150 lb (68 kg)  05/07/23 150 lb (68 kg)         ROS All review of systems negative except what is listed in the HPI    Objective:     BP (!) 140/68   Pulse 92   Ht 5\' 2"  (1.575 m)   Wt 150 lb (68 kg)   SpO2 100%   BMI 27.44 kg/m    Physical Exam Vitals reviewed.  Constitutional:      Appearance: Normal appearance.  Cardiovascular:     Rate and Rhythm: Normal rate and regular rhythm.     Heart sounds: Normal heart sounds.  Pulmonary:     Effort: Pulmonary effort is normal.     Breath sounds: Normal breath sounds.  Skin:    General: Skin is warm and dry.  Neurological:     Mental Status: She is alert and oriented to person, place, and time.     Cranial Nerves: Dysarthria present.  Psychiatric:        Mood and Affect: Mood normal.        Behavior: Behavior normal.        Thought Content: Thought content normal.        Judgment: Judgment normal.      Results for orders placed or performed in visit on 05/28/23  POCT Urinalysis Dipstick (Automated)  Result Value Ref Range   Color, UA yellow    Clarity, UA clear    Glucose, UA Negative Negative   Bilirubin, UA negative    Ketones, UA negative    Spec Grav, UA <=1.005 (A) 1.010 - 1.025   Blood, UA negative    pH, UA 5.0 5.0 - 8.0   Protein, UA Negative Negative    Urobilinogen, UA 0.2 0.2 or 1.0 E.U./dL   Nitrite, UA negative    Leukocytes, UA Negative Negative      The ASCVD Risk score (Arnett DK, et al., 2019) failed to calculate for the following reasons:   Risk score cannot be calculated because patient has a medical history suggesting prior/existing ASCVD    Assessment & Plan:   Problem List Items Addressed This Visit       Active Problems   Essential hypertension - Primary   Blood pressure is at goal for age and co-morbidities.   Recommendations: amlodipine 10 mg daily, lisinopril 5 mg daily, hydralazine 50 mg q8h - BP goal <130/80 - monitor and log blood pressures at home - check around the same time each day in a relaxed setting - Limit salt to <2000 mg/day - Follow DASH eating plan (heart healthy diet) - limit alcohol to 2 standard drinks per day for men and 1 per day for women - avoid tobacco products - get at least 2 hours of regular aerobic exercise weekly Patient aware  of signs/symptoms requiring further/urgent evaluation. Labs updated today.       Relevant Medications   lisinopril (ZESTRIL) 2.5 MG tablet   Other Visit Diagnoses       Gross hematuria     Urinalysis today showed no blood. Episode likely transient. No immediate intervention needed unless recurrence. - Advise to report further hematuria for reevaluation.   Relevant Orders   POCT Urinalysis Dipstick (Automated) (Completed)     Elevated alkaline phosphatase level     Previously elevated, will recheck today. Not fasting.    Relevant Orders   Comp Met (CMET)   Alkaline phosphatase, isoenzymes       Return in about 3 months (around 08/28/2023) for routine follow-up.    Clayborne Dana, NP

## 2023-05-29 ENCOUNTER — Ambulatory Visit (HOSPITAL_COMMUNITY)
Admission: RE | Admit: 2023-05-29 | Discharge: 2023-05-29 | Disposition: A | Discharge status: Home / Self Care | Source: Ambulatory Visit | Attending: Interventional Radiology | Admitting: Interventional Radiology

## 2023-05-29 LAB — COMPREHENSIVE METABOLIC PANEL
ALT: 31 U/L (ref 0–35)
AST: 26 U/L (ref 0–37)
Albumin: 4.7 g/dL (ref 3.5–5.2)
Alkaline Phosphatase: 141 U/L — ABNORMAL HIGH (ref 39–117)
BUN: 8 mg/dL (ref 6–23)
CO2: 23 meq/L (ref 19–32)
Calcium: 10.2 mg/dL (ref 8.4–10.5)
Chloride: 107 meq/L (ref 96–112)
Creatinine, Ser: 0.92 mg/dL (ref 0.40–1.20)
GFR: 69.11 mL/min (ref >60.00)
Glucose, Bld: 82 mg/dL (ref 70–99)
Potassium: 4.2 meq/L (ref 3.5–5.1)
Sodium: 140 meq/L (ref 135–145)
Total Bilirubin: 0.5 mg/dL (ref 0.2–1.2)
Total Protein: 7.6 g/dL (ref 6.0–8.3)

## 2023-05-31 ENCOUNTER — Telehealth: Payer: Self-pay

## 2023-05-31 DIAGNOSIS — M65312 Trigger thumb, left thumb: Secondary | ICD-10-CM | POA: Diagnosis not present

## 2023-05-31 DIAGNOSIS — M24111 Other articular cartilage disorders, right shoulder: Secondary | ICD-10-CM | POA: Diagnosis not present

## 2023-05-31 DIAGNOSIS — N179 Acute kidney failure, unspecified: Secondary | ICD-10-CM | POA: Diagnosis not present

## 2023-05-31 DIAGNOSIS — F419 Anxiety disorder, unspecified: Secondary | ICD-10-CM | POA: Diagnosis not present

## 2023-05-31 DIAGNOSIS — M67432 Ganglion, left wrist: Secondary | ICD-10-CM | POA: Diagnosis not present

## 2023-05-31 DIAGNOSIS — E876 Hypokalemia: Secondary | ICD-10-CM | POA: Diagnosis not present

## 2023-05-31 DIAGNOSIS — I69320 Aphasia following cerebral infarction: Secondary | ICD-10-CM | POA: Diagnosis not present

## 2023-05-31 DIAGNOSIS — R7303 Prediabetes: Secondary | ICD-10-CM | POA: Diagnosis not present

## 2023-05-31 DIAGNOSIS — K449 Diaphragmatic hernia without obstruction or gangrene: Secondary | ICD-10-CM | POA: Diagnosis not present

## 2023-05-31 DIAGNOSIS — N8110 Cystocele, unspecified: Secondary | ICD-10-CM | POA: Diagnosis not present

## 2023-05-31 DIAGNOSIS — M1990 Unspecified osteoarthritis, unspecified site: Secondary | ICD-10-CM | POA: Diagnosis not present

## 2023-05-31 DIAGNOSIS — I700 Atherosclerosis of aorta: Secondary | ICD-10-CM | POA: Diagnosis not present

## 2023-05-31 DIAGNOSIS — H5347 Heteronymous bilateral field defects: Secondary | ICD-10-CM | POA: Diagnosis not present

## 2023-05-31 DIAGNOSIS — E785 Hyperlipidemia, unspecified: Secondary | ICD-10-CM | POA: Diagnosis not present

## 2023-05-31 DIAGNOSIS — M62838 Other muscle spasm: Secondary | ICD-10-CM | POA: Diagnosis not present

## 2023-05-31 DIAGNOSIS — I82411 Acute embolism and thrombosis of right femoral vein: Secondary | ICD-10-CM | POA: Diagnosis not present

## 2023-05-31 DIAGNOSIS — E7800 Pure hypercholesterolemia, unspecified: Secondary | ICD-10-CM | POA: Diagnosis not present

## 2023-05-31 DIAGNOSIS — I119 Hypertensive heart disease without heart failure: Secondary | ICD-10-CM | POA: Diagnosis not present

## 2023-05-31 DIAGNOSIS — I69398 Other sequelae of cerebral infarction: Secondary | ICD-10-CM | POA: Diagnosis not present

## 2023-05-31 DIAGNOSIS — E538 Deficiency of other specified B group vitamins: Secondary | ICD-10-CM | POA: Diagnosis not present

## 2023-05-31 DIAGNOSIS — I69351 Hemiplegia and hemiparesis following cerebral infarction affecting right dominant side: Secondary | ICD-10-CM | POA: Diagnosis not present

## 2023-05-31 DIAGNOSIS — K219 Gastro-esophageal reflux disease without esophagitis: Secondary | ICD-10-CM | POA: Diagnosis not present

## 2023-05-31 DIAGNOSIS — G629 Polyneuropathy, unspecified: Secondary | ICD-10-CM | POA: Diagnosis not present

## 2023-05-31 DIAGNOSIS — D649 Anemia, unspecified: Secondary | ICD-10-CM | POA: Diagnosis not present

## 2023-05-31 NOTE — Telephone Encounter (Signed)
 She can try stopping the dicyclomine if tolerated.   Neurologist and hospitalist had her on both the Eliquis and Brilinta due to her risk/history. This does put her at higher risk of bleeding so she needs to be very careful and focus on fall prevention. Looks like she doesn't see them again for several weeks. Have her call her neurologist to confirm how long they want her to be taking both of these meds.

## 2023-05-31 NOTE — Telephone Encounter (Signed)
 Copied from CRM [id number]. Topic: Clinical - Prescription Issue >> May 31, 2023  2:41 PM Pascal Lux wrote: Reason for CRM: [facility] called to leave a message with provider because fax is not working currently. Stated current medication interactions: 1.) potassium and dicyclomine.  2.) Brilinta and Eliquis are reacting. Phone: [phone] - [phone]

## 2023-05-31 NOTE — Telephone Encounter (Signed)
 Please advise if either are a concern.

## 2023-06-01 DIAGNOSIS — K219 Gastro-esophageal reflux disease without esophagitis: Secondary | ICD-10-CM | POA: Diagnosis not present

## 2023-06-01 DIAGNOSIS — I69398 Other sequelae of cerebral infarction: Secondary | ICD-10-CM | POA: Diagnosis not present

## 2023-06-01 DIAGNOSIS — E538 Deficiency of other specified B group vitamins: Secondary | ICD-10-CM | POA: Diagnosis not present

## 2023-06-01 DIAGNOSIS — E785 Hyperlipidemia, unspecified: Secondary | ICD-10-CM | POA: Diagnosis not present

## 2023-06-01 DIAGNOSIS — M67432 Ganglion, left wrist: Secondary | ICD-10-CM | POA: Diagnosis not present

## 2023-06-01 DIAGNOSIS — M65312 Trigger thumb, left thumb: Secondary | ICD-10-CM | POA: Diagnosis not present

## 2023-06-01 DIAGNOSIS — I69320 Aphasia following cerebral infarction: Secondary | ICD-10-CM | POA: Diagnosis not present

## 2023-06-01 DIAGNOSIS — K449 Diaphragmatic hernia without obstruction or gangrene: Secondary | ICD-10-CM | POA: Diagnosis not present

## 2023-06-01 DIAGNOSIS — F419 Anxiety disorder, unspecified: Secondary | ICD-10-CM | POA: Diagnosis not present

## 2023-06-01 DIAGNOSIS — R7303 Prediabetes: Secondary | ICD-10-CM | POA: Diagnosis not present

## 2023-06-01 DIAGNOSIS — M1990 Unspecified osteoarthritis, unspecified site: Secondary | ICD-10-CM | POA: Diagnosis not present

## 2023-06-01 DIAGNOSIS — N179 Acute kidney failure, unspecified: Secondary | ICD-10-CM | POA: Diagnosis not present

## 2023-06-01 DIAGNOSIS — I119 Hypertensive heart disease without heart failure: Secondary | ICD-10-CM | POA: Diagnosis not present

## 2023-06-01 DIAGNOSIS — G629 Polyneuropathy, unspecified: Secondary | ICD-10-CM | POA: Diagnosis not present

## 2023-06-01 DIAGNOSIS — I700 Atherosclerosis of aorta: Secondary | ICD-10-CM | POA: Diagnosis not present

## 2023-06-01 DIAGNOSIS — E876 Hypokalemia: Secondary | ICD-10-CM | POA: Diagnosis not present

## 2023-06-01 DIAGNOSIS — D649 Anemia, unspecified: Secondary | ICD-10-CM | POA: Diagnosis not present

## 2023-06-01 DIAGNOSIS — I82411 Acute embolism and thrombosis of right femoral vein: Secondary | ICD-10-CM | POA: Diagnosis not present

## 2023-06-01 DIAGNOSIS — M62838 Other muscle spasm: Secondary | ICD-10-CM | POA: Diagnosis not present

## 2023-06-01 DIAGNOSIS — N8110 Cystocele, unspecified: Secondary | ICD-10-CM | POA: Diagnosis not present

## 2023-06-01 DIAGNOSIS — H5347 Heteronymous bilateral field defects: Secondary | ICD-10-CM | POA: Diagnosis not present

## 2023-06-01 DIAGNOSIS — E7800 Pure hypercholesterolemia, unspecified: Secondary | ICD-10-CM | POA: Diagnosis not present

## 2023-06-01 DIAGNOSIS — I69351 Hemiplegia and hemiparesis following cerebral infarction affecting right dominant side: Secondary | ICD-10-CM | POA: Diagnosis not present

## 2023-06-01 DIAGNOSIS — M24111 Other articular cartilage disorders, right shoulder: Secondary | ICD-10-CM | POA: Diagnosis not present

## 2023-06-01 MED ORDER — PANTOPRAZOLE SODIUM 40 MG PO TBEC: 40.0000 mg | DELAYED_RELEASE_TABLET | Freq: Every day | ORAL | 0 refills | Status: DC

## 2023-06-01 NOTE — Telephone Encounter (Signed)
 Called and spoke with patient's mother.   They will stop Dicyclomine and see how she tolerates. They are aware of bleeding risk and are following up with Neurology.

## 2023-06-04 ENCOUNTER — Other Ambulatory Visit: Payer: Self-pay | Admitting: Family Medicine

## 2023-06-04 ENCOUNTER — Telehealth: Payer: Self-pay

## 2023-06-04 DIAGNOSIS — I639 Cerebral infarction, unspecified: Secondary | ICD-10-CM

## 2023-06-04 MED ORDER — SENNOSIDES-DOCUSATE SODIUM 8.6-50 MG PO TABS: 2.0000 | ORAL_TABLET | Freq: Every day | ORAL | 0 refills | Status: DC

## 2023-06-04 NOTE — Telephone Encounter (Signed)
 Copied from CRM [id number]. Topic: General - Other >> Jun 04, 2023 11:57 AM Eunice Blase wrote: Reason for CRM: Pt's mother called regarding replacing current home health due to incompetence. Please call Mrs.Quick [phone].

## 2023-06-04 NOTE — Telephone Encounter (Signed)
 Copied from CRM [id number]. Topic: Clinical - Medication Refill >> Jun 04, 2023  9:06 AM Truddie Crumble wrote: Most Recent Primary Care Visit:  Provider: Clayborne Dana  Department: [facility]  Visit Type: OFFICE VISIT  Date: 05/28/2023  Medication: senna-docusate (SENOKOT-S) 8.6-50 MG tablet ( 90 day)  Has the patient contacted their pharmacy? No (Agent: If no, request that the patient contact the pharmacy for the refill. If patient does not wish to contact the pharmacy document the reason why and proceed with request.) (Agent: If yes, when and what did the pharmacy advise?)  Is this the correct pharmacy for this prescription? Yes If no, delete pharmacy and type the correct one.  This is the patient's preferred pharmacy:  [facility] - [place], [place] - [address] [place] [address] Phone: [phone] Fax: [phone]  Has the prescription been filled recently? No  Is the patient out of the medication? Yes  Has the patient been seen for an appointment in the last year OR does the patient have an upcoming appointment? Yes  Can we respond through MyChart? No  Agent: Please be advised that Rx refills may take up to 3 business days. We ask that you follow-up with your pharmacy.

## 2023-06-04 NOTE — Telephone Encounter (Signed)
Home Health Referral placed.
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
## 2023-06-04 NOTE — Addendum Note (Signed)
 Addended bySilvio Pate on: 06/04/2023 03:22 PM   Modules accepted: Orders

## 2023-06-04 NOTE — Telephone Encounter (Signed)
 Mrs. Nino Parsley called back and provided: [facility] on [address] [place], [place] [address]. Ph# [phone].

## 2023-06-05 DIAGNOSIS — N179 Acute kidney failure, unspecified: Secondary | ICD-10-CM | POA: Diagnosis not present

## 2023-06-05 DIAGNOSIS — E876 Hypokalemia: Secondary | ICD-10-CM | POA: Diagnosis not present

## 2023-06-05 DIAGNOSIS — F419 Anxiety disorder, unspecified: Secondary | ICD-10-CM | POA: Diagnosis not present

## 2023-06-05 DIAGNOSIS — E7800 Pure hypercholesterolemia, unspecified: Secondary | ICD-10-CM | POA: Diagnosis not present

## 2023-06-05 DIAGNOSIS — I700 Atherosclerosis of aorta: Secondary | ICD-10-CM | POA: Diagnosis not present

## 2023-06-05 DIAGNOSIS — M67432 Ganglion, left wrist: Secondary | ICD-10-CM | POA: Diagnosis not present

## 2023-06-05 DIAGNOSIS — I82411 Acute embolism and thrombosis of right femoral vein: Secondary | ICD-10-CM | POA: Diagnosis not present

## 2023-06-05 DIAGNOSIS — E785 Hyperlipidemia, unspecified: Secondary | ICD-10-CM | POA: Diagnosis not present

## 2023-06-05 DIAGNOSIS — I69320 Aphasia following cerebral infarction: Secondary | ICD-10-CM | POA: Diagnosis not present

## 2023-06-05 DIAGNOSIS — I69398 Other sequelae of cerebral infarction: Secondary | ICD-10-CM | POA: Diagnosis not present

## 2023-06-05 DIAGNOSIS — H5347 Heteronymous bilateral field defects: Secondary | ICD-10-CM | POA: Diagnosis not present

## 2023-06-05 DIAGNOSIS — I119 Hypertensive heart disease without heart failure: Secondary | ICD-10-CM | POA: Diagnosis not present

## 2023-06-05 DIAGNOSIS — N8110 Cystocele, unspecified: Secondary | ICD-10-CM | POA: Diagnosis not present

## 2023-06-05 DIAGNOSIS — K449 Diaphragmatic hernia without obstruction or gangrene: Secondary | ICD-10-CM | POA: Diagnosis not present

## 2023-06-05 DIAGNOSIS — M1990 Unspecified osteoarthritis, unspecified site: Secondary | ICD-10-CM | POA: Diagnosis not present

## 2023-06-05 DIAGNOSIS — M62838 Other muscle spasm: Secondary | ICD-10-CM | POA: Diagnosis not present

## 2023-06-05 DIAGNOSIS — G629 Polyneuropathy, unspecified: Secondary | ICD-10-CM | POA: Diagnosis not present

## 2023-06-05 DIAGNOSIS — R7303 Prediabetes: Secondary | ICD-10-CM | POA: Diagnosis not present

## 2023-06-05 DIAGNOSIS — K219 Gastro-esophageal reflux disease without esophagitis: Secondary | ICD-10-CM | POA: Diagnosis not present

## 2023-06-05 DIAGNOSIS — I69351 Hemiplegia and hemiparesis following cerebral infarction affecting right dominant side: Secondary | ICD-10-CM | POA: Diagnosis not present

## 2023-06-05 DIAGNOSIS — M65312 Trigger thumb, left thumb: Secondary | ICD-10-CM | POA: Diagnosis not present

## 2023-06-05 DIAGNOSIS — D649 Anemia, unspecified: Secondary | ICD-10-CM | POA: Diagnosis not present

## 2023-06-05 DIAGNOSIS — E538 Deficiency of other specified B group vitamins: Secondary | ICD-10-CM | POA: Diagnosis not present

## 2023-06-05 DIAGNOSIS — M24111 Other articular cartilage disorders, right shoulder: Secondary | ICD-10-CM | POA: Diagnosis not present

## 2023-06-06 DIAGNOSIS — M67432 Ganglion, left wrist: Secondary | ICD-10-CM | POA: Diagnosis not present

## 2023-06-06 DIAGNOSIS — K219 Gastro-esophageal reflux disease without esophagitis: Secondary | ICD-10-CM | POA: Diagnosis not present

## 2023-06-06 DIAGNOSIS — G629 Polyneuropathy, unspecified: Secondary | ICD-10-CM | POA: Diagnosis not present

## 2023-06-06 DIAGNOSIS — K449 Diaphragmatic hernia without obstruction or gangrene: Secondary | ICD-10-CM | POA: Diagnosis not present

## 2023-06-06 DIAGNOSIS — N179 Acute kidney failure, unspecified: Secondary | ICD-10-CM | POA: Diagnosis not present

## 2023-06-06 DIAGNOSIS — I69398 Other sequelae of cerebral infarction: Secondary | ICD-10-CM | POA: Diagnosis not present

## 2023-06-06 DIAGNOSIS — M24111 Other articular cartilage disorders, right shoulder: Secondary | ICD-10-CM | POA: Diagnosis not present

## 2023-06-06 DIAGNOSIS — I700 Atherosclerosis of aorta: Secondary | ICD-10-CM | POA: Diagnosis not present

## 2023-06-06 DIAGNOSIS — I82411 Acute embolism and thrombosis of right femoral vein: Secondary | ICD-10-CM | POA: Diagnosis not present

## 2023-06-06 DIAGNOSIS — R7303 Prediabetes: Secondary | ICD-10-CM | POA: Diagnosis not present

## 2023-06-06 DIAGNOSIS — I69320 Aphasia following cerebral infarction: Secondary | ICD-10-CM | POA: Diagnosis not present

## 2023-06-06 DIAGNOSIS — N8110 Cystocele, unspecified: Secondary | ICD-10-CM | POA: Diagnosis not present

## 2023-06-06 DIAGNOSIS — F419 Anxiety disorder, unspecified: Secondary | ICD-10-CM | POA: Diagnosis not present

## 2023-06-06 DIAGNOSIS — E538 Deficiency of other specified B group vitamins: Secondary | ICD-10-CM | POA: Diagnosis not present

## 2023-06-06 DIAGNOSIS — M65312 Trigger thumb, left thumb: Secondary | ICD-10-CM | POA: Diagnosis not present

## 2023-06-06 DIAGNOSIS — I69351 Hemiplegia and hemiparesis following cerebral infarction affecting right dominant side: Secondary | ICD-10-CM | POA: Diagnosis not present

## 2023-06-06 DIAGNOSIS — E7800 Pure hypercholesterolemia, unspecified: Secondary | ICD-10-CM | POA: Diagnosis not present

## 2023-06-06 DIAGNOSIS — M62838 Other muscle spasm: Secondary | ICD-10-CM | POA: Diagnosis not present

## 2023-06-06 DIAGNOSIS — I119 Hypertensive heart disease without heart failure: Secondary | ICD-10-CM | POA: Diagnosis not present

## 2023-06-06 DIAGNOSIS — M1990 Unspecified osteoarthritis, unspecified site: Secondary | ICD-10-CM | POA: Diagnosis not present

## 2023-06-06 DIAGNOSIS — D649 Anemia, unspecified: Secondary | ICD-10-CM | POA: Diagnosis not present

## 2023-06-06 DIAGNOSIS — E876 Hypokalemia: Secondary | ICD-10-CM | POA: Diagnosis not present

## 2023-06-06 DIAGNOSIS — H5347 Heteronymous bilateral field defects: Secondary | ICD-10-CM | POA: Diagnosis not present

## 2023-06-06 DIAGNOSIS — E785 Hyperlipidemia, unspecified: Secondary | ICD-10-CM | POA: Diagnosis not present

## 2023-06-07 ENCOUNTER — Encounter: Attending: Physical Medicine & Rehabilitation | Admitting: Physical Medicine & Rehabilitation

## 2023-06-07 ENCOUNTER — Encounter: Payer: Self-pay | Admitting: Physical Medicine & Rehabilitation

## 2023-06-07 VITALS — BP 146/87 | HR 109 | Ht 62.0 in

## 2023-06-07 DIAGNOSIS — I82411 Acute embolism and thrombosis of right femoral vein: Secondary | ICD-10-CM | POA: Insufficient documentation

## 2023-06-07 DIAGNOSIS — K5900 Constipation, unspecified: Secondary | ICD-10-CM | POA: Diagnosis not present

## 2023-06-07 DIAGNOSIS — N179 Acute kidney failure, unspecified: Secondary | ICD-10-CM | POA: Diagnosis not present

## 2023-06-07 DIAGNOSIS — I10 Essential (primary) hypertension: Secondary | ICD-10-CM | POA: Insufficient documentation

## 2023-06-07 DIAGNOSIS — E876 Hypokalemia: Secondary | ICD-10-CM | POA: Diagnosis not present

## 2023-06-07 DIAGNOSIS — I63512 Cerebral infarction due to unspecified occlusion or stenosis of left middle cerebral artery: Secondary | ICD-10-CM | POA: Diagnosis not present

## 2023-06-07 MED ORDER — SENNOSIDES-DOCUSATE SODIUM 8.6-50 MG PO TABS: 2.0000 | ORAL_TABLET | Freq: Every day | ORAL | 1 refills | Status: AC

## 2023-06-07 NOTE — Progress Notes (Signed)
 Subjective:    Patient ID: Brittany [name], female    DOB: 17-Dec-1965, 58 y.o.   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  Patient reports she continues home PT OT and SLP.  Overall doing much better with ADLs at home.  Mother reports she needs help making her meals but can wash her close, bathe, wash some dishes, toilet herself.  Patient primary focus today is that she has been having groin pain, planning to have groin vaginal region as location of her pain.  Mother reports sometimes she indicates pain on her right upper thigh region.  She was seen in the ER a few weeks ago she was seen in the ER a few weeks ago, had pelvic ultrasound and overall negative workup.  Pain worsened again today.  She has appointment with her PCP tomorrow for this issue.  Mother reports she has neurology follow-up scheduled and interventional radiology scheduled follow-up scheduled also.  She had been seen by PCP.  Having more regular bowel movements, although ran out of Senokot a few days ago and would like an order for this medication.  Pain Inventory Average Pain 6 Pain Right Now 8 My pain is constant and stabbing  LOCATION OF PAIN  Vaginal, groin, left thigh   BOWEL Number of stools per week: 14 Oral laxative use Yes  Type of laxative Senokot Enema or suppository use No  History of colostomy No  Incontinent No   BLADDER Normal     Mobility use a walker how many minutes can you walk? 5 ability to climb steps?  no Do you have any goals in this area?  yes  Function disabled: date disabled years I need assistance with the following:  meal prep, household duties, and shopping Do you have any goals in this area?  yes  Neuro/Psych weakness numbness confusion  Prior Studies Any changes since last visit?  no  Physicians involved in your  care Any changes since last visit?  no   Family History  Problem Relation Age of Onset   Hypertension Mother    Sarcoidosis Mother        currently in remission    Diverticulitis Mother    Irritable bowel syndrome Mother    Liver cancer Mother    Hypertension Father    Stomach cancer Father    Congestive Heart Failure Father    Stroke Maternal Uncle    Scoliosis Brother    Colon cancer Neg Hx    Esophageal cancer Neg Hx    Colon polyps Neg Hx    Rectal cancer Neg Hx    Social History   Socioeconomic History   Marital status: Legally Separated    Spouse name: Not on file   Number of children: Not on file   Years of education: Not on file   Highest education level: Not on file  Occupational History   Occupation: Disabled  Tobacco Use   Smoking status: Former    Current packs/day: 0.00    Types: Cigarettes    Quit date: 12/06/2015    Years since quitting: 7.5   Smokeless tobacco: Never  Vaping Use   Vaping status: Never Used  Substance and Sexual Activity   Alcohol use: Not Currently   Drug use: Not Currently    Types: Marijuana    Comment: in the past  Sexual activity: Not Currently  Other Topics Concern   Not on file  Social History Narrative   Not on file   Social Drivers of Health   Financial Resource Strain: Not on file  Food Insecurity: Food Insecurity Present (04/12/2023)   Hunger Vital Sign    Worried About Running Out of Food in the Last Year: Sometimes true    Ran Out of Food in the Last Year: Never true  Transportation Needs: Unmet Transportation Needs (04/12/2023)   PRAPARE - Administrator, Civil Service (Medical): Yes    Lack of Transportation (Non-Medical): Yes  Physical Activity: Not on file  Stress: Not on file  Social Connections: Not on file   Past Surgical History:  Procedure Laterality Date   BONE BIOPSY  2005   in her back   COLONOSCOPY     over 10 years x3   ESOPHAGOGASTRODUODENOSCOPY     incomplete-over 10 years  ago    IR CT HEAD LTD  04/16/2023   IR CT HEAD LTD  04/16/2023   IR INTRA CRAN STENT  04/16/2023   IR PERCUTANEOUS ART THROMBECTOMY/INFUSION INTRACRANIAL INC DIAG ANGIO  04/16/2023   RADIOLOGY WITH ANESTHESIA N/A 04/16/2023   Procedure: IR WITH ANESTHESIA;  Surgeon: Julieanne Cotton, MD;  Location: [facility] OR;  Service: Radiology;  Laterality: N/A;   TEE WITHOUT CARDIOVERSION N/A 12/08/2016   Procedure: TRANSESOPHAGEAL ECHOCARDIOGRAM (TEE);  Surgeon: Quintella Reichert, MD;  Location: [facility] ENDOSCOPY;  Service: Cardiovascular;  Laterality: N/A;   TUBAL LIGATION     UPPER GASTROINTESTINAL ENDOSCOPY     WISDOM TOOTH EXTRACTION     Past Medical History:  Diagnosis Date   Anemia    Arthritis    trigger finger in left hand   Complication of anesthesia    per pt, hard to wake up!   Elevated cholesterol    Female bladder prolapse    per urologist, does not have prolaspe   GERD (gastroesophageal reflux disease)    Heart murmur    pt unsure.   Hiatal hernia    Hypertension    IBS (irritable bowel syndrome)    Multiple myeloma (HCC) 2005   had partial chemo   Peripheral neuropathy    SOB (shortness of breath) on exertion    uses an inhaler   Stroke (HCC) 2017   paralysis left arm/uses a walker   BP (!) 146/87   Pulse (!) 109   Opioid Risk Score:   Fall Risk Score:  `1  Depression screen PHQ 2/9     06/07/2023    1:05 PM 01/06/2021    1:46 PM 10/27/2019    3:23 PM 08/04/2019    1:37 PM 02/04/2019    1:38 PM 10/08/2018    8:44 AM 09/06/2018   10:31 AM  Depression screen PHQ 2/9  Decreased Interest 0 0 0 0 0 0 0  Down, Depressed, Hopeless 0 0 0 0 0 0 0  PHQ - 2 Score 0 0 0 0 0 0 0  Altered sleeping 0        Tired, decreased energy 0        Change in appetite 0        Feeling bad or failure about yourself  0        Trouble concentrating 0        Moving slowly or fidgety/restless 0        Suicidal thoughts 0        PHQ-9 Score  0          Review of Systems  Gastrointestinal:  Positive  for constipation.  Neurological:  Positive for weakness and numbness.  Psychiatric/Behavioral:  Positive for confusion.   All other systems reviewed and are negative.      Objective:   Physical Exam  Gen: No acute distress HEENT: oral mucosa pink and moist, NCAT Chest: normal effort, normal rate of breathing Abd: soft, non-distended Ext: no edema Psych: Appears uncomfortable, anxious Skin: intact Neuro: Alert and awake, follows simple commands, limited verbal output and expressive greater than receptive aphasia.  Occasionally frustrated with communication difficulties..  Frequently gesturing for communication.  Slight right facial weakness RUE: 5/5 Deltoid, 5/5 Biceps, 5/5 Triceps, 5/5 Wrist Ext, 5/5 Grip LUE: 5/5 Deltoid, 5/5 Biceps, 5/5 Triceps, 5/5 Wrist Ext, 5/5 Grip RLE: 4 out of 5, pain/effort limited LLE: 4+ out of 5 No abnormal tone noted Musculoskeletal:   Right groin prior hematoma site appears improved without significant swelling or tenderness noted in this area, bruising has resolved Significant pain with right hip internal and external rotation No significant right knee TTP No significant L-spine paraspinal TTP  Patient frequently pointing to her central groin vaginal region as location of her pain      Assessment & Plan:   1. Left MCA infarct due to severe left MCA stenosis; notable Hx prior L cerebellar CVAs             -Continue home PT/OT/SLP-primarily speech deficits at this time -Continue follow-up with PCP, neurology, interventional radiology as directed -Continue Brilinta, Eliquis   2.  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  This area does not appear tender but she reports vaginal/groin area pain today.  Follow-up with PCP scheduled for tomorrow.  May need gynecology follow-up.  3. HTN -Mildly elevated today may  be related to her groin pain -Continue PCP f/u    06/07/2023    1:03 PM 06/07/2023    1:01 PM 05/28/2023    3:04 PM  Vitals with BMI  Height  5\' 2"    Weight  --   Systolic 146 163 578  Diastolic 87 89 68  Pulse  109      4.  Constipation/IBS history-reported to be having more regular bowel movements -Refill Senokot  5. Hypokalemia  -Patient had labs on 3/10 with potassium 4.2-stable  -Continue follow-up with PCP 6. Substance abuse hx: Encourage continued cessation marijuana  7. H/o MM: Acute renal failure  -Creatinine appears stable 0.92 3/10.

## 2023-06-08 ENCOUNTER — Other Ambulatory Visit (HOSPITAL_BASED_OUTPATIENT_CLINIC_OR_DEPARTMENT_OTHER): Payer: Self-pay

## 2023-06-08 ENCOUNTER — Encounter: Payer: Self-pay | Admitting: Family

## 2023-06-08 ENCOUNTER — Other Ambulatory Visit (HOSPITAL_COMMUNITY)
Admission: RE | Admit: 2023-06-08 | Discharge: 2023-06-08 | Disposition: A | Discharge status: Home / Self Care | Source: Ambulatory Visit | Attending: Family | Admitting: Family

## 2023-06-08 ENCOUNTER — Ambulatory Visit (INDEPENDENT_AMBULATORY_CARE_PROVIDER_SITE_OTHER): Admitting: Family

## 2023-06-08 VITALS — BP 162/82 | HR 100 | Temp 98.8°F | Resp 16 | Ht 62.0 in | Wt 150.0 lb

## 2023-06-08 DIAGNOSIS — I10 Essential (primary) hypertension: Secondary | ICD-10-CM | POA: Diagnosis not present

## 2023-06-08 DIAGNOSIS — N760 Acute vaginitis: Secondary | ICD-10-CM

## 2023-06-08 DIAGNOSIS — R300 Dysuria: Secondary | ICD-10-CM | POA: Diagnosis not present

## 2023-06-08 LAB — POC URINALSYSI DIPSTICK (AUTOMATED)
Bilirubin, UA: NEGATIVE
Blood, UA: NEGATIVE
Glucose, UA: NEGATIVE
Ketones, UA: NEGATIVE
Leukocytes, UA: NEGATIVE
Nitrite, UA: NEGATIVE
Protein, UA: NEGATIVE
Spec Grav, UA: 1.01 (ref 1.010–1.025)
Urobilinogen, UA: 0.2 U/dL
pH, UA: 5 (ref 5.0–8.0)

## 2023-06-08 MED ORDER — VALACYCLOVIR HCL 500 MG PO TABS
500.0000 mg | ORAL_TABLET | Freq: Two times a day (BID) | ORAL | 0 refills | Status: AC
  Filled 2023-06-08: qty 6, 3d supply, fill #0

## 2023-06-08 NOTE — Progress Notes (Signed)
 Subjective:     Patient ID: Brittany [name], female    DOB: Jan 06, 1966, 58 y.o.   MRN: [id number]  Chief Complaint  Patient presents with   Vaginal Pain    Patient complains of vaginal pain for about 2 days   Dysuria    Pain with urination     Vaginal Pain Associated symptoms include dysuria.  Dysuria     Discussed the use of AI scribe software for clinical note transcription with the patient, who gave verbal consent to proceed.  History of Present Illness  Brittany [name] is a 58 year old female who presents with vaginal pain and swelling. She is accompanied by her mother.  She has been experiencing severe vaginal pain and swelling for the past two days, which has significantly impacted her daily activities, including requiring assistance with washing. The pain is primarily localized to the vaginal area but is also associated with abdominal discomfort. There is no current concern for sexually transmitted infections, and she has not undergone a hysterectomy, retaining her uterus.  Her mother recalls a possible herpes infection approximately ten years ago, for which she received treatment. She has not had any sexual contact in a number of years.  She is experiencing constipation, likely related to her irritable bowel syndrome (IBS), which has resulted in rectal bleeding during defecation. Her history of stroke affects her ability to verbally communicate her symptoms. She is currently on two blood thinners.  BP Readings from Last 3 Encounters:  06/08/23 (!) 162/82  06/07/23 (!) 146/87  05/28/23 (!) 140/68        Health Maintenance Due  Topic Date Due   Cervical Cancer Screening (HPV/Pap Cotest)  Never done   Zoster Vaccines- Shingrix (1 of 2) Never done   MAMMOGRAM  02/27/2021   Colonoscopy  09/01/2021    Past Medical History:  Diagnosis Date   Anemia    Arthritis    trigger finger in left hand   Complication of anesthesia    per pt, hard to wake up!    Elevated cholesterol    Female bladder prolapse    per urologist, does not have prolaspe   GERD (gastroesophageal reflux disease)    Heart murmur    pt unsure.   Hiatal hernia    Hypertension    IBS (irritable bowel syndrome)    Multiple myeloma (HCC) 2005   had partial chemo   Peripheral neuropathy    SOB (shortness of breath) on exertion    uses an inhaler   Stroke (HCC) 2017   paralysis left arm/uses a walker    Past Surgical History:  Procedure Laterality Date   BONE BIOPSY  2005   in her back   COLONOSCOPY     over 10 years x3   ESOPHAGOGASTRODUODENOSCOPY     incomplete-over 10 years ago    IR CT HEAD LTD  04/16/2023   IR CT HEAD LTD  04/16/2023   IR INTRA CRAN STENT  04/16/2023   IR PERCUTANEOUS ART THROMBECTOMY/INFUSION INTRACRANIAL INC DIAG ANGIO  04/16/2023   RADIOLOGY WITH ANESTHESIA N/A 04/16/2023   Procedure: IR WITH ANESTHESIA;  Surgeon: Julieanne Cotton, MD;  Location: [facility] OR;  Service: Radiology;  Laterality: N/A;   TEE WITHOUT CARDIOVERSION N/A 12/08/2016   Procedure: TRANSESOPHAGEAL ECHOCARDIOGRAM (TEE);  Surgeon: Quintella Reichert, MD;  Location: [facility] ENDOSCOPY;  Service: Cardiovascular;  Laterality: N/A;   TUBAL LIGATION     UPPER GASTROINTESTINAL ENDOSCOPY     WISDOM TOOTH EXTRACTION  Family History  Problem Relation Age of Onset   Hypertension Mother    Sarcoidosis Mother        currently in remission    Diverticulitis Mother    Irritable bowel syndrome Mother    Liver cancer Mother    Hypertension Father    Stomach cancer Father    Congestive Heart Failure Father    Stroke Maternal Uncle    Scoliosis Brother    Colon cancer Neg Hx    Esophageal cancer Neg Hx    Colon polyps Neg Hx    Rectal cancer Neg Hx     Social History   Socioeconomic History   Marital status: Legally Separated    Spouse name: Not on file   Number of children: Not on file   Years of education: Not on file   Highest education level: Not on file  Occupational  History   Occupation: Disabled  Tobacco Use   Smoking status: Former    Current packs/day: 0.00    Types: Cigarettes    Quit date: 12/06/2015    Years since quitting: 7.5   Smokeless tobacco: Never  Vaping Use   Vaping status: Never Used  Substance and Sexual Activity   Alcohol use: Not Currently   Drug use: Not Currently    Types: Marijuana    Comment: in the past    Sexual activity: Not Currently  Other Topics Concern   Not on file  Social History Narrative   Not on file   Social Drivers of Health   Financial Resource Strain: Not on file  Food Insecurity: Food Insecurity Present (04/12/2023)   Hunger Vital Sign    Worried About Running Out of Food in the Last Year: Sometimes true    Ran Out of Food in the Last Year: Never true  Transportation Needs: Unmet Transportation Needs (04/12/2023)   PRAPARE - Administrator, Civil Service (Medical): Yes    Lack of Transportation (Non-Medical): Yes  Physical Activity: Not on file  Stress: Not on file  Social Connections: Not on file  Intimate Partner Violence: Not At Risk (04/12/2023)   Humiliation, Afraid, Rape, and Kick questionnaire    Fear of Current or Ex-Partner: No    Emotionally Abused: No    Physically Abused: No    Sexually Abused: No    Outpatient Medications Prior to Visit  Medication Sig Dispense Refill   amLODipine (NORVASC) 10 MG tablet Take 1 tablet (10 mg total) by mouth daily. 90 tablet 0   apixaban (ELIQUIS) 5 MG TABS tablet Take 1 tablet (5 mg total) by mouth 2 (two) times daily. 180 tablet 0   atorvastatin (LIPITOR) 80 MG tablet Take 1 tablet (80 mg total) by mouth daily with supper. 90 tablet 0   cholecalciferol (CHOLECALCIFEROL) 25 MCG tablet Take 1 tablet (1,000 Units total) by mouth daily.     docusate sodium (COLACE) 100 MG capsule Take 1 capsule (100 mg total) by mouth 2 (two) times daily. 60 capsule 0   ezetimibe (ZETIA) 10 MG tablet Take 1 tablet (10 mg total) by mouth daily. 90 tablet 0    gabapentin (NEURONTIN) 300 MG capsule Take 1 capsule (300 mg total) by mouth 3 (three) times daily. (Patient taking differently: Take 300 mg by mouth at bedtime.) 90 capsule 0   linaclotide (LINZESS) 72 MCG capsule Take 72 mcg by mouth every 3 (three) days.     lisinopril (ZESTRIL) 2.5 MG tablet Take 1 tablet (2.5 mg total)  by mouth in the morning and at bedtime. 180 tablet 1   potassium chloride (KLOR-CON 10) 10 MEQ tablet Take 2 tablets (20 mEq total) by mouth 2 (two) times daily. 120 tablet 3   senna-docusate (SENOKOT-S) 8.6-50 MG tablet Take 2 tablets by mouth daily at 6 (six) AM. 60 tablet 1   ticagrelor (BRILINTA) 90 MG TABS tablet Take 0.5 tablets (45 mg total) by mouth 2 (two) times daily. 180 tablet 0   hydrALAZINE (APRESOLINE) 50 MG tablet Take 1 tablet (50 mg total) by mouth every 8 (eight) hours. 90 tablet 0   pantoprazole (PROTONIX) 40 MG tablet Take 1 tablet (40 mg total) by mouth daily. (Patient not taking: Reported on 06/08/2023) 90 tablet 0   dicyclomine (BENTYL) 10 MG capsule Take 1 capsule (10 mg total) by mouth 3 (three) times daily before meals. 90 capsule 0   No facility-administered medications prior to visit.    Allergies  Allergen Reactions   Amitriptyline Other (See Comments)    Patient reported that it made her throat feel like its locking up and it also caused issues with her going to the bathroom   Duloxetine Other (See Comments)    Patient reported that it made her throat lock up and it caused her to have issue with going to the bathroom   Naproxen     Vomiting, sweating, abd spasms   Other     States can't take pain meds that end in "cet" or meds that end in "ine" Darvocet/severe vomiting   Beef-Derived Drug Products     Patient has IBS prefers no beef   Pork-Derived Products     Patient has IBS prefers no pork   Darvon [Propoxyphene] Nausea And Vomiting   Hydrocodone Nausea And Vomiting   Lactose Intolerance (Gi)     Bloating, gas, abd pain   Latex  Itching and Rash   Oxycodone Nausea And Vomiting   Percocet [Oxycodone-Acetaminophen] Nausea And Vomiting   Topamax [Topiramate]     Memory made her emotional     Review of Systems  Genitourinary:  Positive for dysuria and vaginal pain.       Objective:    Physical Exam Constitutional:      Appearance: Normal appearance.  Genitourinary:    Comments: Some irritation of mucous membranes near introitus on the right  No vulvar swelling, normal appearing vaginal discharge Neurological:     Mental Status: She is alert.     Comments: Some expressive aphasia  Psychiatric:        Mood and Affect: Mood normal.        Behavior: Behavior normal.        Thought Content: Thought content normal.        Judgment: Judgment normal.      BP (!) 162/82 (BP Location: Left Arm, Patient Position: Sitting, Cuff Size: Normal)   Pulse 100   Temp 98.8 F (37.1 C) (Oral)   Resp 16   Ht 5\' 2"  (1.575 m)   Wt 150 lb (68 kg)   SpO2 100%   BMI 27.44 kg/m  Wt Readings from Last 3 Encounters:  06/08/23 150 lb (68 kg)  05/28/23 150 lb (68 kg)  05/07/23 150 lb (68 kg)       Assessment & Plan:   Problem List Items Addressed This Visit       Unprioritized   Essential hypertension   BP elevated today. Pt was upset, so will bring back to see PCP for blood  pressure recheck.       Acute vaginitis   Urinalysis is unremarkable.  Acute vaginal pain and swelling with differential including herpes simplex virus infection. - Perform vaginal swab for laboratory analysis to rule out yeast infection/BV. - Prescribe Valtrex for potential herpes simplex virus infection. - Advise monitoring of symptoms and report if no improvement or worsening in the next week.       Relevant Orders   Cervicovaginal ancillary only( [place])   Other Visit Diagnoses       Dysuria    -  Primary   Relevant Orders   POCT Urinalysis Dipstick (Automated) (Completed)       I have discontinued Yancey Flemings.  Grether's dicyclomine. I am also having her start on valACYclovir. Additionally, I am having her maintain her hydrALAZINE, vitamin D3, docusate sodium, gabapentin, linaclotide, potassium chloride, amLODipine, apixaban, atorvastatin, ticagrelor, ezetimibe, lisinopril, pantoprazole, and senna-docusate.  Meds ordered this encounter  Medications   valACYclovir (VALTREX) 500 MG tablet    Sig: Take 1 tablet (500 mg total) by mouth 2 (two) times daily.    Dispense:  6 tablet    Refill:  0    Supervising Provider:   Danise Edge A [[id number]]

## 2023-06-08 NOTE — Assessment & Plan Note (Signed)
 BP elevated today. Pt was upset, so will bring back to see PCP for blood pressure recheck.

## 2023-06-08 NOTE — Patient Instructions (Signed)
 VISIT SUMMARY:  Today, we addressed your concerns about vaginal pain and swelling, as well as constipation with rectal bleeding. We discussed potential causes and initiated treatment plans to help alleviate your symptoms.  YOUR PLAN:  -VAGINAL PAIN AND SWELLING: You are experiencing acute vaginal pain and swelling, which may be due to a herpes simplex virus infection. We have taken a vaginal swab for lab analysis to rule out other infections. In the meantime, you have been prescribed Valtrex to treat a potential herpes infection. Please monitor your symptoms and let us know if there is no improvement or if they worsen within the next week.  INSTRUCTIONS:  Please follow up with [place] if your symptoms do not improve or worsen within the next week. Additionally, we will recheck your blood pressure before you leave today.

## 2023-06-08 NOTE — Assessment & Plan Note (Signed)
 Urinalysis is unremarkable.  Acute vaginal pain and swelling with differential including herpes simplex virus infection. - Perform vaginal swab for laboratory analysis to rule out yeast infection/BV. - Prescribe Valtrex for potential herpes simplex virus infection. - Advise monitoring of symptoms and report if no improvement or worsening in the next week.

## 2023-06-09 DIAGNOSIS — I639 Cerebral infarction, unspecified: Secondary | ICD-10-CM | POA: Diagnosis not present

## 2023-06-11 ENCOUNTER — Telehealth: Payer: Self-pay | Admitting: Family Medicine

## 2023-06-11 ENCOUNTER — Encounter: Payer: Medicaid Other | Admitting: Physical Medicine & Rehabilitation

## 2023-06-11 LAB — CERVICOVAGINAL ANCILLARY ONLY
Bacterial Vaginitis (gardnerella): NEGATIVE
Candida Glabrata: NEGATIVE
Candida Vaginitis: NEGATIVE
Comment: NEGATIVE
Comment: NEGATIVE
Comment: NEGATIVE

## 2023-06-11 NOTE — Telephone Encounter (Signed)
 Patient's mom made aware [facility] is under the [facility] umbrella. All questions answered.

## 2023-06-11 NOTE — Telephone Encounter (Signed)
 Copied from CRM [id number]. Topic: Referral - Question >> Jun 11, 2023 10:34 AM Sim Boast F wrote: Reason for CRM: Patient mother wants to confirm that the [facility] is the company that will keep coming out to help her daughter. She says that [facility] came out and they are confused. Requested a call back with an update on what is going on. Requested that we leave a detailed message on voicemail if she doesn't answer.

## 2023-06-12 ENCOUNTER — Encounter: Payer: Self-pay | Admitting: Family

## 2023-06-12 DIAGNOSIS — I69320 Aphasia following cerebral infarction: Secondary | ICD-10-CM | POA: Diagnosis not present

## 2023-06-12 DIAGNOSIS — E785 Hyperlipidemia, unspecified: Secondary | ICD-10-CM | POA: Diagnosis not present

## 2023-06-12 DIAGNOSIS — M1990 Unspecified osteoarthritis, unspecified site: Secondary | ICD-10-CM | POA: Diagnosis not present

## 2023-06-12 DIAGNOSIS — E876 Hypokalemia: Secondary | ICD-10-CM | POA: Diagnosis not present

## 2023-06-12 DIAGNOSIS — K449 Diaphragmatic hernia without obstruction or gangrene: Secondary | ICD-10-CM | POA: Diagnosis not present

## 2023-06-12 DIAGNOSIS — I69351 Hemiplegia and hemiparesis following cerebral infarction affecting right dominant side: Secondary | ICD-10-CM | POA: Diagnosis not present

## 2023-06-12 DIAGNOSIS — M62838 Other muscle spasm: Secondary | ICD-10-CM | POA: Diagnosis not present

## 2023-06-12 DIAGNOSIS — I69398 Other sequelae of cerebral infarction: Secondary | ICD-10-CM | POA: Diagnosis not present

## 2023-06-12 DIAGNOSIS — G629 Polyneuropathy, unspecified: Secondary | ICD-10-CM | POA: Diagnosis not present

## 2023-06-12 DIAGNOSIS — K219 Gastro-esophageal reflux disease without esophagitis: Secondary | ICD-10-CM | POA: Diagnosis not present

## 2023-06-12 DIAGNOSIS — I82411 Acute embolism and thrombosis of right femoral vein: Secondary | ICD-10-CM | POA: Diagnosis not present

## 2023-06-12 DIAGNOSIS — N179 Acute kidney failure, unspecified: Secondary | ICD-10-CM | POA: Diagnosis not present

## 2023-06-12 DIAGNOSIS — F419 Anxiety disorder, unspecified: Secondary | ICD-10-CM | POA: Diagnosis not present

## 2023-06-12 DIAGNOSIS — E7800 Pure hypercholesterolemia, unspecified: Secondary | ICD-10-CM | POA: Diagnosis not present

## 2023-06-12 DIAGNOSIS — M24111 Other articular cartilage disorders, right shoulder: Secondary | ICD-10-CM | POA: Diagnosis not present

## 2023-06-12 DIAGNOSIS — E538 Deficiency of other specified B group vitamins: Secondary | ICD-10-CM | POA: Diagnosis not present

## 2023-06-12 DIAGNOSIS — R7303 Prediabetes: Secondary | ICD-10-CM | POA: Diagnosis not present

## 2023-06-12 DIAGNOSIS — D649 Anemia, unspecified: Secondary | ICD-10-CM | POA: Diagnosis not present

## 2023-06-12 DIAGNOSIS — M65312 Trigger thumb, left thumb: Secondary | ICD-10-CM | POA: Diagnosis not present

## 2023-06-12 DIAGNOSIS — M67432 Ganglion, left wrist: Secondary | ICD-10-CM | POA: Diagnosis not present

## 2023-06-12 DIAGNOSIS — H5347 Heteronymous bilateral field defects: Secondary | ICD-10-CM | POA: Diagnosis not present

## 2023-06-12 DIAGNOSIS — I700 Atherosclerosis of aorta: Secondary | ICD-10-CM | POA: Diagnosis not present

## 2023-06-12 DIAGNOSIS — N8110 Cystocele, unspecified: Secondary | ICD-10-CM | POA: Diagnosis not present

## 2023-06-12 DIAGNOSIS — I119 Hypertensive heart disease without heart failure: Secondary | ICD-10-CM | POA: Diagnosis not present

## 2023-06-13 ENCOUNTER — Ambulatory Visit (HOSPITAL_COMMUNITY)
Admission: RE | Admit: 2023-06-13 | Discharge: 2023-06-13 | Disposition: A | Discharge status: Home / Self Care | Source: Ambulatory Visit | Attending: Interventional Radiology | Admitting: Interventional Radiology

## 2023-06-13 ENCOUNTER — Telehealth: Payer: Self-pay | Admitting: Neurology

## 2023-06-13 DIAGNOSIS — I771 Stricture of artery: Secondary | ICD-10-CM

## 2023-06-13 NOTE — Telephone Encounter (Signed)
 Looks like Melissa saw patient for this issue.   Copied from CRM [id number]. Topic: Clinical - Lab/Test Results >> Jun 12, 2023 10:36 AM Fredrich Romans wrote: Reason for CRM: Patients mom called in to ask about her vaginal swab test that she had done on 06/08/2023.

## 2023-06-14 ENCOUNTER — Telehealth: Payer: Self-pay | Admitting: Neurology

## 2023-06-14 ENCOUNTER — Telehealth: Payer: Self-pay

## 2023-06-14 DIAGNOSIS — H5347 Heteronymous bilateral field defects: Secondary | ICD-10-CM | POA: Diagnosis not present

## 2023-06-14 DIAGNOSIS — F419 Anxiety disorder, unspecified: Secondary | ICD-10-CM | POA: Diagnosis not present

## 2023-06-14 DIAGNOSIS — N8110 Cystocele, unspecified: Secondary | ICD-10-CM | POA: Diagnosis not present

## 2023-06-14 DIAGNOSIS — M65312 Trigger thumb, left thumb: Secondary | ICD-10-CM | POA: Diagnosis not present

## 2023-06-14 DIAGNOSIS — I82411 Acute embolism and thrombosis of right femoral vein: Secondary | ICD-10-CM | POA: Diagnosis not present

## 2023-06-14 DIAGNOSIS — E876 Hypokalemia: Secondary | ICD-10-CM | POA: Diagnosis not present

## 2023-06-14 DIAGNOSIS — E7800 Pure hypercholesterolemia, unspecified: Secondary | ICD-10-CM | POA: Diagnosis not present

## 2023-06-14 DIAGNOSIS — I700 Atherosclerosis of aorta: Secondary | ICD-10-CM | POA: Diagnosis not present

## 2023-06-14 DIAGNOSIS — I69351 Hemiplegia and hemiparesis following cerebral infarction affecting right dominant side: Secondary | ICD-10-CM | POA: Diagnosis not present

## 2023-06-14 DIAGNOSIS — K449 Diaphragmatic hernia without obstruction or gangrene: Secondary | ICD-10-CM | POA: Diagnosis not present

## 2023-06-14 DIAGNOSIS — E538 Deficiency of other specified B group vitamins: Secondary | ICD-10-CM | POA: Diagnosis not present

## 2023-06-14 DIAGNOSIS — N179 Acute kidney failure, unspecified: Secondary | ICD-10-CM | POA: Diagnosis not present

## 2023-06-14 DIAGNOSIS — I69398 Other sequelae of cerebral infarction: Secondary | ICD-10-CM | POA: Diagnosis not present

## 2023-06-14 DIAGNOSIS — G629 Polyneuropathy, unspecified: Secondary | ICD-10-CM | POA: Diagnosis not present

## 2023-06-14 DIAGNOSIS — M1990 Unspecified osteoarthritis, unspecified site: Secondary | ICD-10-CM | POA: Diagnosis not present

## 2023-06-14 DIAGNOSIS — M62838 Other muscle spasm: Secondary | ICD-10-CM | POA: Diagnosis not present

## 2023-06-14 DIAGNOSIS — E785 Hyperlipidemia, unspecified: Secondary | ICD-10-CM | POA: Diagnosis not present

## 2023-06-14 DIAGNOSIS — M67432 Ganglion, left wrist: Secondary | ICD-10-CM | POA: Diagnosis not present

## 2023-06-14 DIAGNOSIS — I69320 Aphasia following cerebral infarction: Secondary | ICD-10-CM | POA: Diagnosis not present

## 2023-06-14 DIAGNOSIS — D649 Anemia, unspecified: Secondary | ICD-10-CM | POA: Diagnosis not present

## 2023-06-14 DIAGNOSIS — K219 Gastro-esophageal reflux disease without esophagitis: Secondary | ICD-10-CM | POA: Diagnosis not present

## 2023-06-14 DIAGNOSIS — I119 Hypertensive heart disease without heart failure: Secondary | ICD-10-CM | POA: Diagnosis not present

## 2023-06-14 DIAGNOSIS — R7303 Prediabetes: Secondary | ICD-10-CM | POA: Diagnosis not present

## 2023-06-14 DIAGNOSIS — M24111 Other articular cartilage disorders, right shoulder: Secondary | ICD-10-CM | POA: Diagnosis not present

## 2023-06-14 HISTORY — PX: IR RADIOLOGIST EVAL & MGMT: IMG5224

## 2023-06-14 NOTE — Telephone Encounter (Signed)
 Mother called and was given results by [facility]

## 2023-06-14 NOTE — Telephone Encounter (Unsigned)
 Copied from CRM [id number]. Topic: Clinical - Medical Advice >> Jun 14, 2023  3:42 PM Almira Coaster wrote: Reason for CRM: Victorino Dike from [facility] is calling to advise patient is asking for discharge from home health PT. Best call back number for Victorino Dike is [phone]

## 2023-06-14 NOTE — Telephone Encounter (Signed)
 Called but n/a lvm for call back

## 2023-06-14 NOTE — Telephone Encounter (Signed)
 Copied from CRM [id number]. Topic: Clinical - Lab/Test Results >> Jun 14, 2023  1:36 PM Truddie Crumble wrote: Reason for CRM: patient mom is returning a call to the office from [employer] regarding lab results. I read the results to the patient mom Patient mom did not know if the new medication that the patient was on was the cause of it

## 2023-06-14 NOTE — Telephone Encounter (Signed)
 Patient is post stroke, results need to be given to patient's mom, care giver.  Ok to relay that all results are normal/ negative. Vaginal swab was negative, urine test showed no infection. Ok to relay.

## 2023-06-15 NOTE — Telephone Encounter (Signed)
 Called and spoke with patient's mother. She states they wanted to continue PT but she only has so many visits left and they wanted her to use them for speech therapy as this is a bigger deficit. Called Victorino Dike and made her aware okay.

## 2023-06-19 ENCOUNTER — Telehealth: Payer: Self-pay | Admitting: Neurology

## 2023-06-19 DIAGNOSIS — R102 Pelvic and perineal pain: Secondary | ICD-10-CM

## 2023-06-19 NOTE — Telephone Encounter (Signed)
 Patient recently saw Melissa for this issue. Okay to refer to GYN? (Copied to both providers).   Copied from CRM [id number]. Topic: Referral - Request for Referral >> Jun 19, 2023 11:19 AM Elizebeth Brooking wrote: Did the patient discuss referral with their provider in the last year? Yes (If No - schedule appointment) (If Yes - send message)  Appointment offered? Yes  Type of order/referral and detailed reason for visit: still bothering her in her vaginal area   Preference of office, provider, location: any gynecologist  If referral order, have you been seen by this specialty before? No (If Yes, this issue or another issue? When? Where?  Can we respond through MyChart? Yes

## 2023-06-19 NOTE — Telephone Encounter (Signed)
 I have placed an order for GYN referral.

## 2023-06-19 NOTE — Telephone Encounter (Signed)
 Called and LVM letting patient/mother know that most medications were sent recently for 90 day supplies. Instructed them to call us back and let us know if any specific medications were needed.    Copied from CRM [id number]. Topic: Clinical - Medication Question >> Jun 19, 2023 11:22 AM Brittany [name] wrote: Reason for CRM: Patient mom called in stated she wanted to know if Brittany [name] could prescribed her medications for 90 days, as she will be going out out of town. Would like them all to be sent to   [facility] - [place], [place] - [address] [place], [place] [address] Phone: [phone]  Fax: [phone]

## 2023-06-19 NOTE — Addendum Note (Signed)
 Addended by: Sandford Craze on: 06/19/2023 04:06 PM   Modules accepted: Orders

## 2023-06-20 ENCOUNTER — Encounter (HOSPITAL_BASED_OUTPATIENT_CLINIC_OR_DEPARTMENT_OTHER): Payer: Self-pay

## 2023-06-20 ENCOUNTER — Emergency Department (HOSPITAL_BASED_OUTPATIENT_CLINIC_OR_DEPARTMENT_OTHER)
Admission: EM | Admit: 2023-06-20 | Discharge: 2023-06-20 | Disposition: A | Discharge status: Home / Self Care | Attending: Emergency Medicine | Admitting: Emergency Medicine

## 2023-06-20 ENCOUNTER — Emergency Department (HOSPITAL_BASED_OUTPATIENT_CLINIC_OR_DEPARTMENT_OTHER)

## 2023-06-20 ENCOUNTER — Other Ambulatory Visit: Payer: Self-pay

## 2023-06-20 DIAGNOSIS — Z91040 Latex allergy status: Secondary | ICD-10-CM | POA: Diagnosis not present

## 2023-06-20 DIAGNOSIS — K5900 Constipation, unspecified: Secondary | ICD-10-CM | POA: Diagnosis not present

## 2023-06-20 DIAGNOSIS — R102 Pelvic and perineal pain: Secondary | ICD-10-CM | POA: Insufficient documentation

## 2023-06-20 DIAGNOSIS — Z8673 Personal history of transient ischemic attack (TIA), and cerebral infarction without residual deficits: Secondary | ICD-10-CM | POA: Diagnosis not present

## 2023-06-20 DIAGNOSIS — N898 Other specified noninflammatory disorders of vagina: Secondary | ICD-10-CM | POA: Diagnosis not present

## 2023-06-20 DIAGNOSIS — R1030 Lower abdominal pain, unspecified: Secondary | ICD-10-CM | POA: Diagnosis present

## 2023-06-20 DIAGNOSIS — K5730 Diverticulosis of large intestine without perforation or abscess without bleeding: Secondary | ICD-10-CM | POA: Diagnosis not present

## 2023-06-20 DIAGNOSIS — N281 Cyst of kidney, acquired: Secondary | ICD-10-CM | POA: Diagnosis not present

## 2023-06-20 LAB — COMPREHENSIVE METABOLIC PANEL WITH GFR
ALT: 44 U/L (ref 0–44)
AST: 41 U/L (ref 15–41)
Albumin: 4.3 g/dL (ref 3.5–5.0)
Alkaline Phosphatase: 111 U/L (ref 38–126)
Anion gap: 9 (ref 5–15)
BUN: 9 mg/dL (ref 6–20)
CO2: 21 mmol/L — ABNORMAL LOW (ref 22–32)
Calcium: 9.5 mg/dL (ref 8.9–10.3)
Chloride: 108 mmol/L (ref 98–111)
Creatinine, Ser: 0.74 mg/dL (ref 0.44–1.00)
GFR, Estimated: >60 mL/min (ref >60)
Glucose, Bld: 98 mg/dL (ref 70–99)
Potassium: 3.7 mmol/L (ref 3.5–5.1)
Sodium: 138 mmol/L (ref 135–145)
Total Bilirubin: 0.5 mg/dL (ref 0.0–1.2)
Total Protein: 7.8 g/dL (ref 6.5–8.1)

## 2023-06-20 LAB — URINALYSIS, MICROSCOPIC (REFLEX)

## 2023-06-20 LAB — CBC WITH DIFFERENTIAL/PLATELET
Abs Immature Granulocytes: 0.02 10*3/uL (ref 0.00–0.07)
Basophils Absolute: 0 10*3/uL (ref 0.0–0.1)
Basophils Relative: 0 %
Eosinophils Absolute: 0.1 10*3/uL (ref 0.0–0.5)
Eosinophils Relative: 1 %
HCT: 37.9 % (ref 36.0–46.0)
Hemoglobin: 12.7 g/dL (ref 12.0–15.0)
Immature Granulocytes: 0 %
Lymphocytes Relative: 45 %
Lymphs Abs: 4 10*3/uL (ref 0.7–4.0)
MCH: 28.8 pg (ref 26.0–34.0)
MCHC: 33.5 g/dL (ref 30.0–36.0)
MCV: 85.9 fL (ref 80.0–100.0)
Monocytes Absolute: 0.8 10*3/uL (ref 0.1–1.0)
Monocytes Relative: 9 %
Neutro Abs: 4 10*3/uL (ref 1.7–7.7)
Neutrophils Relative %: 45 %
Platelets: 193 10*3/uL (ref 150–400)
RBC: 4.41 MIL/uL (ref 3.87–5.11)
RDW: 14.7 % (ref 11.5–15.5)
WBC: 9 10*3/uL (ref 4.0–10.5)
nRBC: 0 % (ref 0.0–0.2)

## 2023-06-20 LAB — LIPASE, BLOOD: Lipase: 43 U/L (ref 11–51)

## 2023-06-20 LAB — WET PREP, GENITAL
Clue Cells Wet Prep HPF POC: NONE SEEN
Sperm: NONE SEEN
Trich, Wet Prep: NONE SEEN
WBC, Wet Prep HPF POC: <10 (ref <10)
Yeast Wet Prep HPF POC: NONE SEEN

## 2023-06-20 LAB — URINALYSIS, ROUTINE W REFLEX MICROSCOPIC
Bilirubin Urine: NEGATIVE
Glucose, UA: NEGATIVE mg/dL
Ketones, ur: NEGATIVE mg/dL
Leukocytes,Ua: NEGATIVE
Nitrite: NEGATIVE
Protein, ur: NEGATIVE mg/dL
Specific Gravity, Urine: 1.01 (ref 1.005–1.030)
pH: 5.5 (ref 5.0–8.0)

## 2023-06-20 LAB — PREGNANCY, URINE: Preg Test, Ur: NEGATIVE

## 2023-06-20 MED ORDER — IOHEXOL 300 MG/ML  SOLN
100.0000 mL | Freq: Once | INTRAMUSCULAR | Status: AC | PRN
Start: 2023-06-20 — End: 2023-06-20
  Administered 2023-06-20: 100 mL via INTRAVENOUS

## 2023-06-20 NOTE — ED Provider Notes (Signed)
 [facility] Provider Note   CSN: [id number] Arrival date & time: 06/20/23  0142     History  Chief Complaint  Patient presents with   Vaginal Pain    Brittany [name] is a 58 y.o. female.  Level 5 caveat for aphasia from previous stroke.  Mother at bedside supplements history.  Patient with previous stroke with right-sided deficits 2 months ago.  Here with vaginal pain for the past 2 days.  Has lower abdominal pain as well as pain with urination.  No vaginal bleeding or discharge.  She is not sexually active.  No bleeding.  No rashes.  No fever, chills, nausea, vomiting, constipation, diarrhea.  Mother reports similar symptoms several months ago without clear diagnosis.  Most of her pain is inside her vagina.  She has lower abdominal pain as well.  She was treated with Valtrex at 1 point for questionable herpes but this diagnosis was not confirmed.  The history is provided by the patient and a relative. The history is limited by the condition of the patient.  Vaginal Pain Associated symptoms include abdominal pain. Pertinent negatives include no headaches and no shortness of breath.       Home Medications Prior to Admission medications   Medication Sig Start Date End Date Taking? Authorizing Provider  amLODipine (NORVASC) 10 MG tablet Take 1 tablet (10 mg total) by mouth daily. 05/28/23 06/27/23  Clayborne Dana, NP  apixaban (ELIQUIS) 5 MG TABS tablet Take 1 tablet (5 mg total) by mouth 2 (two) times daily. 05/28/23   Clayborne Dana, NP  atorvastatin (LIPITOR) 80 MG tablet Take 1 tablet (80 mg total) by mouth daily with supper. 05/28/23 05/27/24  Clayborne Dana, NP  cholecalciferol (CHOLECALCIFEROL) 25 MCG tablet Take 1 tablet (1,000 Units total) by mouth daily. 04/21/23   Lynnae January, NP  docusate sodium (COLACE) 100 MG capsule Take 1 capsule (100 mg total) by mouth 2 (two) times daily. 05/03/23   Love, Evlyn Kanner, PA-C  ezetimibe (ZETIA)  10 MG tablet Take 1 tablet (10 mg total) by mouth daily. 05/28/23   Clayborne Dana, NP  gabapentin (NEURONTIN) 300 MG capsule Take 1 capsule (300 mg total) by mouth 3 (three) times daily. Patient taking differently: Take 300 mg by mouth at bedtime. 05/03/23   Love, Evlyn Kanner, PA-C  hydrALAZINE (APRESOLINE) 50 MG tablet Take 1 tablet (50 mg total) by mouth every 8 (eight) hours. 12/09/22 05/28/23  Arrien, York Ram, MD  linaclotide [facility]) 72 MCG capsule Take 72 mcg by mouth every 3 (three) days.    [provider]  lisinopril (ZESTRIL) 2.5 MG tablet Take 1 tablet (2.5 mg total) by mouth in the morning and at bedtime. 05/28/23   Clayborne Dana, NP  pantoprazole (PROTONIX) 40 MG tablet Take 1 tablet (40 mg total) by mouth daily. Patient not taking: Reported on 06/08/2023 06/01/23   Hyman Hopes B, NP  potassium chloride (KLOR-CON 10) 10 MEQ tablet Take 2 tablets (20 mEq total) by mouth 2 (two) times daily. 05/07/23   Clayborne Dana, NP  senna-docusate (SENOKOT-S) 8.6-50 MG tablet Take 2 tablets by mouth daily at 6 (six) AM. 06/07/23   Fanny Dance, MD  ticagrelor (BRILINTA) 90 MG TABS tablet Take 0.5 tablets (45 mg total) by mouth 2 (two) times daily. 05/28/23   Clayborne Dana, NP  valACYclovir (VALTREX) 500 MG tablet Take 1 tablet (500 mg total) by mouth 2 (two) times  daily. 06/08/23   Sandford Craze, NP      Allergies    Amitriptyline, Duloxetine, Naproxen, Other, Beef-derived drug products, Pork-derived products, Darvon [propoxyphene], Hydrocodone, Lactose intolerance (gi), Latex, Oxycodone, Percocet [oxycodone-acetaminophen], and Topamax [topiramate]    Review of Systems   Review of Systems  Constitutional:  Negative for activity change, appetite change and fever.  HENT:  Negative for congestion.   Respiratory:  Negative for cough, chest tightness and shortness of breath.   Gastrointestinal:  Positive for abdominal pain. Negative for vomiting.  Genitourinary:  Positive for  dysuria and vaginal pain. Negative for vaginal discharge.  Musculoskeletal:  Negative for arthralgias and myalgias.  Skin:  Negative for rash.  Neurological:  Negative for dizziness, weakness and headaches.   all other systems are negative except as noted in the HPI and PMH.    Physical Exam Updated Vital Signs BP (!) 173/87 (BP Location: Left Arm)   Pulse 92   Temp 98.3 F (36.8 C) (Oral)   Resp 18   Ht 5\' 2"  (1.575 m)   Wt 68 kg   SpO2 100%   BMI 27.42 kg/m  Physical Exam Vitals and nursing note reviewed.  Constitutional:      General: She is not in acute distress.    Appearance: She is well-developed.     Comments: Expressive aphasia at baseline  HENT:     Head: Normocephalic and atraumatic.     Mouth/Throat:     Pharynx: No oropharyngeal exudate.  Eyes:     Conjunctiva/sclera: Conjunctivae normal.     Pupils: Pupils are equal, round, and reactive to light.  Neck:     Comments: No meningismus. Cardiovascular:     Rate and Rhythm: Normal rate and regular rhythm.     Heart sounds: Normal heart sounds. No murmur heard. Pulmonary:     Effort: Pulmonary effort is normal. No respiratory distress.     Breath sounds: Normal breath sounds.  Abdominal:     Palpations: Abdomen is soft.     Tenderness: There is abdominal tenderness. There is no guarding or rebound.     Comments: Suprapubic tenderness, no guarding or rebound  Genitourinary:    Comments: Chaperone present Therapist, occupational.  Normal external genitalia.  No obvious erythema, rashes or wounds.  No obvious discharge. Musculoskeletal:        General: No tenderness. Normal range of motion.     Cervical back: Normal range of motion and neck supple.     Comments: No CVAT  Skin:    General: Skin is warm.  Neurological:     Mental Status: She is alert and oriented to person, place, and time.     Cranial Nerves: Cranial nerve deficit present.     Motor: Weakness present. No abnormal muscle tone.     Coordination:  Coordination normal.     Comments: Right-sided weakness at baseline  Psychiatric:        Behavior: Behavior normal.     ED Results / Procedures / Treatments   Labs (all labs ordered are listed, but only abnormal results are displayed) Labs Reviewed  URINALYSIS, ROUTINE W REFLEX MICROSCOPIC - Abnormal; Notable for the following components:      Result Value   Hgb urine dipstick TRACE (*)    All other components within normal limits  COMPREHENSIVE METABOLIC PANEL WITH GFR - Abnormal; Notable for the following components:   CO2 21 (*)    All other components within normal limits  URINALYSIS, MICROSCOPIC (REFLEX) -  Abnormal; Notable for the following components:   Bacteria, UA RARE (*)    All other components within normal limits  WET PREP, GENITAL  PREGNANCY, URINE  CBC WITH DIFFERENTIAL/PLATELET  LIPASE, BLOOD  GC/CHLAMYDIA PROBE AMP ([place]) NOT AT [facility]    EKG None  Radiology CT ABDOMEN PELVIS W CONTRAST Result Date: 06/20/2023 CLINICAL DATA:  Abdominal pain, acute, nonlocalized. Vaginal pain and dysuria onset yesterday with history of bladder prolapse and multiple myeloma. EXAM: CT ABDOMEN AND PELVIS WITH CONTRAST TECHNIQUE: Multidetector CT imaging of the abdomen and pelvis was performed using the standard protocol following bolus administration of intravenous contrast. RADIATION DOSE REDUCTION: This exam was performed according to the departmental dose-optimization program which includes automated exposure control, adjustment of the mA and/or kV according to patient size and/or use of iterative reconstruction technique. CONTRAST:  OMNIPAQUE IOHEXOL 300 MG/ML  SOLN COMPARISON:  CT abdomen and pelvis without contrast 04/24/2023, CT abdomen and pelvis with IV contrast 05/30/2018 . FINDINGS: Lower chest: Interval increased left ventricular and septal wall hypertrophy and mild cardiomegaly. No pericardial effusion is seen. Lung bases are clear. Mild elevation right  hemidiaphragm. Hepatobiliary: No focal liver abnormality is seen. No gallstones, gallbladder wall thickening, or biliary dilatation. Pancreas: No abnormality. Spleen: No abnormality. Adrenals/Urinary Tract: There is no adrenal mass, no solid renal mass enhancement. There is a 5.7 cm Bosniak 1 cyst of the anterior aspect of the left kidney, Hounsfield density is 8.5. No follow-up imaging is recommended. There is no urinary stone or obstruction. The bladder is unremarkable for the degree of distension. Stomach/Bowel: Unremarkable stomach and unopacified small bowel. Normal caliber retrocecal appendix. Moderate retained stool. Colonic diverticulosis is noted without evidence of colitis or diverticulitis Vascular/Lymphatic: Left greater than right pelvic venous congestion. Moderate aortoiliac atherosclerosis. On the right there is moderate irregular mixed plaque stenosis in the common iliac, internal and external iliac arteries. On the left, there is thrombosed occlusion of the common iliac and external iliac artery, with reconstitution of the left common femoral artery just past the inguinal ligament apparently from the left deep circumflex iliac artery. The bilateral visualized outflow arteries are patent. Reproductive: Uterus and bilateral adnexa are unremarkable. Other: No abdominal wall hernia or abnormality. No abdominopelvic ascites. Musculoskeletal: Slight lumbar dextroscoliosis with mild degenerative changes. Pelvic enthesopathy. No acute or other significant osseous findings. IMPRESSION: 1. No acute CT findings in the abdomen or pelvis. 2. Constipation and diverticulosis. 3. 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 4. Interval increased left ventricular and septal wall hypertrophy and mild cardiomegaly. 5.  Left greater than right pelvic venous congestion. 6. Bosniak 1 cyst of the left kidney. 7. Lumbar degenerative changes and scoliosis. Electronically Signed   By: Almira Bar M.D.   On: 06/20/2023 03:38    Procedures Procedures    Medications Ordered in ED Medications - No data to display  ED Course/ Medical Decision Making/ A&P                                 Medical Decision Making Amount and/or Complexity of Data Reviewed Labs: ordered. Decision-making details documented in ED Course. Radiology: ordered and independent interpretation performed. Decision-making details documented in ED Course. ECG/medicine tests: ordered and independent interpretation performed. Decision-making details documented in ED  Course.  Risk Prescription drug management.   Vaginal pain and lower abdominal pain.  No fever.  Some dysuria.  Vital stable.  Abdomen soft without peritoneal signs.  No obvious lesions seen on external exam.  Urinalysis is negative.  Pelvic exam is benign as above. Labs are reassuring with normal LFTs and lipase.  No leukocytosis Vaginal swabs are negative.  She did have a normal pelvic ultrasound in February. CT scan is obtained today for further assessment of lower abdominal pain and vaginal pain.  CT shows no acute findings.  Results reviewed and interpreted by me.  Does show constipation which may be contributing to her discomfort.  Occlusion of left femoral vessels noted but does have reconstitution distally.  It appears her intervention occurred on the right side.  She does have intact DP and PT pulses bilaterally and feet are warm and well-perfused.  She is already on Eliquis and brilinta.  Will refer to vascular surgery for further evaluation of her atherosclerosis of vascular occlusion.  Continue bowel regimen.  Mother states patient cannot tolerate MiraLAX will continue Colace.  Follow-up with PCP as well as gynecology for further evaluation of her pelvic pain.  Low  suspicion for ovarian torsion currently.  Referred to vascular surgery as well for further evaluation of atherosclerosis and occlusions of the left iliac vessels.  Mother reports she is been having intermittent vaginal discomfort ever since her stroke in January.  Workup thus far has been unrevealing as to source.  Will refer to gynecology as well as vascular surgery for further evaluation. Return precautions discussed.       Final Clinical Impression(s) / ED Diagnoses Final diagnoses:  Vaginal pain    Rx / DC Orders ED Discharge Orders     None         Ladene Allocca, Jeannett Senior, MD 06/20/23 [id number]

## 2023-06-20 NOTE — Discharge Instructions (Addendum)
 Testing is reassuring.  No UTI.  Does have some constipation which may be contributing to her vaginal discomfort.  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  Follow-up with the vascular surgeon for further evaluation of this and continue Eliquis.   IMPRESSION: 1. No acute CT findings in the abdomen or pelvis. 2. Constipation and diverticulosis. 3. 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 4. Interval increased left ventricular and septal wall hypertrophy and mild cardiomegaly. 5. Left greater than right pelvic venous congestion. 6. Bosniak 1 cyst of the left kidney. 7. Lumbar degenerative changes and scoliosis.

## 2023-06-20 NOTE — Telephone Encounter (Signed)
 Patient's mother (care giver) reports patient had to go to the  ED and has been schedule to see gynecology

## 2023-06-20 NOTE — ED Triage Notes (Signed)
 Pt states she started having vaginal pain yesterday +dysuria Recent CVA approx 2 months ago

## 2023-06-20 NOTE — ED Notes (Signed)
 Patient transported to CT

## 2023-06-21 ENCOUNTER — Telehealth: Payer: Self-pay | Admitting: Neurology

## 2023-06-21 LAB — GC/CHLAMYDIA PROBE AMP (~~LOC~~) NOT AT ARMC
Chlamydia: NEGATIVE
Comment: NEGATIVE
Comment: NORMAL
Neisseria Gonorrhea: NEGATIVE

## 2023-06-21 NOTE — Telephone Encounter (Signed)
 Copied from CRM [id number]. Topic: Referral - Request for Referral >> Jun 21, 2023 11:43 AM Lorin Glass B wrote: Did the patient discuss referral with their provider in the last year? Yes (If No - schedule appointment) (If Yes - send message)  Appointment offered?   Type of order/referral and detailed reason for visit: Home Health Speech Therapy  Preference of office, provider, location: [facility]  If referral order, have you been seen by this specialty before? Yes, [facility] (If Yes, this issue or another issue? When? Where?  Can we respond through MyChart? No

## 2023-06-21 NOTE — Telephone Encounter (Signed)
 Patient should already have a referral for speech therapy?

## 2023-06-25 ENCOUNTER — Other Ambulatory Visit: Payer: Self-pay | Admitting: Family Medicine

## 2023-06-25 DIAGNOSIS — G8929 Other chronic pain: Secondary | ICD-10-CM

## 2023-06-26 NOTE — Telephone Encounter (Signed)
 Just verifying that speech therapy was also ordered, but this was included on referral, so patient should not need anything.

## 2023-06-27 ENCOUNTER — Ambulatory Visit: Payer: Medicaid Other | Attending: Cardiology | Admitting: Cardiology

## 2023-06-27 ENCOUNTER — Encounter: Payer: Self-pay | Admitting: Cardiology

## 2023-06-27 ENCOUNTER — Telehealth: Payer: Self-pay | Admitting: Family Medicine

## 2023-06-27 VITALS — BP 146/82 | HR 82 | Ht 64.0 in | Wt 146.0 lb

## 2023-06-27 DIAGNOSIS — E7800 Pure hypercholesterolemia, unspecified: Secondary | ICD-10-CM | POA: Diagnosis not present

## 2023-06-27 DIAGNOSIS — I5189 Other ill-defined heart diseases: Secondary | ICD-10-CM

## 2023-06-27 DIAGNOSIS — Q2112 Patent foramen ovale: Secondary | ICD-10-CM

## 2023-06-27 DIAGNOSIS — I10 Essential (primary) hypertension: Secondary | ICD-10-CM | POA: Diagnosis not present

## 2023-06-27 DIAGNOSIS — Z8673 Personal history of transient ischemic attack (TIA), and cerebral infarction without residual deficits: Secondary | ICD-10-CM

## 2023-06-27 DIAGNOSIS — R002 Palpitations: Secondary | ICD-10-CM

## 2023-06-27 DIAGNOSIS — I69328 Other speech and language deficits following cerebral infarction: Secondary | ICD-10-CM

## 2023-06-27 DIAGNOSIS — I639 Cerebral infarction, unspecified: Secondary | ICD-10-CM

## 2023-06-27 NOTE — Progress Notes (Signed)
 Yes, please; fasting, no heavy meals the night prior

## 2023-06-27 NOTE — Telephone Encounter (Signed)
Yes, that's fine. Thank you!

## 2023-06-27 NOTE — Telephone Encounter (Signed)
 Okay for new speech therapy referral?

## 2023-06-27 NOTE — Patient Instructions (Addendum)
 Medication Instructions:  Your physician recommends that you continue on your current medications as directed. Please refer to the Current Medication list given to you today.  *If you need a refill on your cardiac medications before your next appointment, please call your pharmacy*   Lab Work: 3rd Floor   [address]   Your physician recommends that you return for lab work in:  when fasting  You need to have labs done when you are fasting.  You can come Monday through Friday 8:00 am to 11:30AM and 1:00 to 4:00. You do not need to make an appointment as the order has already been placed.    Testing/Procedures:  WHY IS MY DOCTOR PRESCRIBING ZIO? ( 2 -14 day Zio's) The Zio system is proven and trusted by physicians to detect and diagnose irregular heart rhythms -- and has been prescribed to hundreds of thousands of patients.  The FDA has cleared the Zio system to monitor for many different kinds of irregular heart rhythms. In a study, physicians were able to reach a diagnosis 90% of the time with the Zio system1.  You can wear the Zio monitor -- a small, discreet, comfortable patch -- during your normal day-to-day activity, including while you sleep, shower, and exercise, while it records every single heartbeat for analysis.  1Barrett, P., et al. Comparison of 24 Hour Holter Monitoring Versus 14 Day Novel Adhesive Patch Electrocardiographic Monitoring. American Journal of Medicine, 2014.  ZIO VS. HOLTER MONITORING The Zio monitor can be comfortably worn for up to 14 days. Holter monitors can be worn for 24 to 48 hours, limiting the time to record any irregular heart rhythms you may have. Zio is able to capture data for the 51% of patients who have their first symptom-triggered arrhythmia after 48 hours.1  LIVE WITHOUT RESTRICTIONS The Zio ambulatory cardiac monitor is a small, unobtrusive, and water-resistant patch--you might even forget you're wearing it. The Zio monitor records and stores  every beat of your heart, whether you're sleeping, working out, or showering.     Follow-Up: At [facility], you and your health needs are our priority.  As part of our continuing mission to provide you with exceptional heart care, we have created designated Provider Care Teams.  These Care Teams include your primary Cardiologist (physician) and Advanced Practice Providers (APPs -  Physician Assistants and Nurse Practitioners) who all work together to provide you with the care you need, when you need it.  We recommend signing up for the patient portal called "MyChart".  Sign up information is provided on this After Visit Summary.  MyChart is used to connect with patients for Virtual Visits (Telemedicine).  Patients are able to view lab/test results, encounter notes, upcoming appointments, etc.  Non-urgent messages can be sent to your provider as well.   To learn more about what you can do with MyChart, go to [email or web address].    Your next appointment:   2 month(s)  The format for your next appointment:   In Person  Provider:   Gypsy Balsam, MD    Other Instructions NA

## 2023-06-27 NOTE — Telephone Encounter (Signed)
 Referral placed.

## 2023-06-27 NOTE — Progress Notes (Signed)
 Cardiology Consultation:    Date:  06/27/2023   ID:  Brittany [name], Brittany [name] 03-30-1965, MRN [id number]  PCP:  Clayborne Dana, NP  Cardiologist:  Gypsy Balsam, MD   Referring MD: Coralie Keens*   Chief Complaint  Patient presents with   Stroke follow up    History of Present Illness:    Brittany [name] is a 59 y.o. female who is being seen today for the evaluation of history of CVA at the request of Arrien, York Ram*.  This is a very sad story she is a young 58 years old past medical history significant for hypertension, hyperlipidemia, multiple myeloma, IBS, GERD she presented to med Center in [facility] in January 23 of this year with 1 to 2 weeks of some aphasia and dysarthria.  She was found to have left MCA territory stroke and severe left MCA stenosis patient also has history of prior cerebellar stroke with minimal residual deficit given the fact that she got symptoms for a few days there was no need for acute intervention she was transferred to [facility] for future workup however in January 27 she developed acute onset of right gaze deviation and inability to follow commands and global aphasia CTA revealed proximal left M1 occlusion she was taken for mechanical thrombectomy after discussion with IR and consent being obtained from her son.  Revascularization was achieved with placement of rescue stent.  She was placed after that on Eliquis as well as Brilinta Eliquis was because of DVT of her right lower extremity.  Interestingly in 2018 she is being evaluated for left PICA infarct carotid Doppler was unremarkable she was found to have PFO she was discharged home on aspirin and statin, loop recorder was recommended but she never had it done.  She comes today to my office to discuss those issues.  She is doing fine she is aphasic she comes to the room with her mother who participated in the decision making she is doing Botox since patient have aphasia.   Apparently doing fine denies have any major complaints  Past Medical History:  Diagnosis Date   Anemia    Arthritis    trigger finger in left hand   Complication of anesthesia    per pt, hard to wake up!   Elevated cholesterol    Female bladder prolapse    per urologist, does not have prolaspe   GERD (gastroesophageal reflux disease)    Heart murmur    pt unsure.   Hiatal hernia    Hypertension    IBS (irritable bowel syndrome)    Multiple myeloma (HCC) 2005   had partial chemo   Peripheral neuropathy    SOB (shortness of breath) on exertion    uses an inhaler   Stroke (HCC) 2017   paralysis left arm/uses a walker    Past Surgical History:  Procedure Laterality Date   BONE BIOPSY  2005   in her back   COLONOSCOPY     over 10 years x3   ESOPHAGOGASTRODUODENOSCOPY     incomplete-over 10 years ago    IR CT HEAD LTD  04/16/2023   IR CT HEAD LTD  04/16/2023   IR INTRA CRAN STENT  04/16/2023   IR PERCUTANEOUS ART THROMBECTOMY/INFUSION INTRACRANIAL INC DIAG ANGIO  04/16/2023   IR RADIOLOGIST EVAL & MGMT  06/14/2023   RADIOLOGY WITH ANESTHESIA N/A 04/16/2023   Procedure: IR WITH ANESTHESIA;  Surgeon: Julieanne Cotton, MD;  Location: [facility] OR;  Service:  Radiology;  Laterality: N/A;   TEE WITHOUT CARDIOVERSION N/A 12/08/2016   Procedure: TRANSESOPHAGEAL ECHOCARDIOGRAM (TEE);  Surgeon: Quintella Reichert, MD;  Location: [facility] ENDOSCOPY;  Service: Cardiovascular;  Laterality: N/A;   TUBAL LIGATION     UPPER GASTROINTESTINAL ENDOSCOPY     WISDOM TOOTH EXTRACTION      Current Medications: Current Meds  Medication Sig   amLODipine (NORVASC) 10 MG tablet Take 1 tablet (10 mg total) by mouth daily.   apixaban (ELIQUIS) 5 MG TABS tablet Take 1 tablet (5 mg total) by mouth 2 (two) times daily.   atorvastatin (LIPITOR) 80 MG tablet Take 1 tablet (80 mg total) by mouth daily with supper.   cholecalciferol (CHOLECALCIFEROL) 25 MCG tablet Take 1 tablet (1,000 Units total) by mouth daily.    docusate sodium (COLACE) 100 MG capsule Take 1 capsule (100 mg total) by mouth 2 (two) times daily.   ezetimibe (ZETIA) 10 MG tablet Take 1 tablet (10 mg total) by mouth daily.   gabapentin (NEURONTIN) 300 MG capsule TAKE 1 CAPSULE (300 MG TOTAL) BY MOUTH 3 (THREE) TIMES DAILY.   hydrALAZINE (APRESOLINE) 50 MG tablet Take 1 tablet (50 mg total) by mouth every 8 (eight) hours.   linaclotide (LINZESS) 72 MCG capsule Take 72 mcg by mouth every 3 (three) days.   lisinopril (ZESTRIL) 2.5 MG tablet Take 1 tablet (2.5 mg total) by mouth in the morning and at bedtime.   pantoprazole (PROTONIX) 40 MG tablet Take 1 tablet (40 mg total) by mouth daily.   potassium chloride (KLOR-CON 10) 10 MEQ tablet Take 2 tablets (20 mEq total) by mouth 2 (two) times daily.   senna-docusate (SENOKOT-S) 8.6-50 MG tablet Take 2 tablets by mouth daily at 6 (six) AM.   ticagrelor (BRILINTA) 90 MG TABS tablet Take 0.5 tablets (45 mg total) by mouth 2 (two) times daily.   valACYclovir (VALTREX) 500 MG tablet Take 1 tablet (500 mg total) by mouth 2 (two) times daily.     Allergies:   Amitriptyline, Duloxetine, Naproxen, Other, Beef-derived drug products, Pork-derived products, Darvon [propoxyphene], Hydrocodone, Lactose intolerance (gi), Latex, Oxycodone, Percocet [oxycodone-acetaminophen], and Topamax [topiramate]   Social History   Socioeconomic History   Marital status: Legally Separated    Spouse name: Not on file   Number of children: Not on file   Years of education: Not on file   Highest education level: Not on file  Occupational History   Occupation: Disabled  Tobacco Use   Smoking status: Former    Current packs/day: 0.00    Types: Cigarettes    Quit date: 12/06/2015    Years since quitting: 7.5   Smokeless tobacco: Never  Vaping Use   Vaping status: Never Used  Substance and Sexual Activity   Alcohol use: Not Currently   Drug use: Not Currently    Types: Marijuana    Comment: in the past    Sexual  activity: Not Currently  Other Topics Concern   Not on file  Social History Narrative   Not on file   Social Drivers of Health   Financial Resource Strain: Not on file  Food Insecurity: Food Insecurity Present (04/12/2023)   Hunger Vital Sign    Worried About Running Out of Food in the Last Year: Sometimes true    Ran Out of Food in the Last Year: Never true  Transportation Needs: Unmet Transportation Needs (04/12/2023)   PRAPARE - Transportation    Lack of Transportation (Medical): Yes    Lack of  Transportation (Non-Medical): Yes  Physical Activity: Not on file  Stress: Not on file  Social Connections: Not on file     Family History: The patient's family history includes Congestive Heart Failure in her father; Diverticulitis in her mother; Hypertension in her father and mother; Irritable bowel syndrome in her mother; Liver cancer in her mother; Sarcoidosis in her mother; Scoliosis in her brother; Stomach cancer in her father; Stroke in her maternal uncle. There is no history of Colon cancer, Esophageal cancer, Colon polyps, or Rectal cancer. ROS:   Please see the history of present illness.    All 14 point review of systems negative except as described per history of present illness.  EKGs/Labs/Other Studies Reviewed:    The following studies were reviewed today Echocardiogram from April 13, 2023 showed:   1. Mild mid cavitary gradient. Peak velocity 0.94 m/s. Peak gradient 3.5  mmHg. With Valsalva, velocity increases to 1.2 m/s and peak gradient 5.6  mmHg. Left ventricular ejection fraction, by estimation, is 60 to 65%. The  left ventricle has normal  function. The left ventricle has no regional wall motion abnormalities.  There is severe left ventricular hypertrophy. Left ventricular diastolic  parameters are consistent with Grade I diastolic dysfunction (impaired  relaxation).   2. Right ventricular systolic function is normal. The right ventricular  size is normal.    3. Left atrial size was moderately dilated.   4. The mitral valve is normal in structure. No evidence of mitral valve  regurgitation. No evidence of mitral stenosis.   5. The aortic valve is normal in structure. Aortic valve regurgitation is  not visualized. No aortic stenosis is present.   6. The inferior vena cava is normal in size with greater than 50%  respiratory variability, suggesting right atrial pressure of 3 mmHg.   FINDINGS   EKG:  EKG Interpretation Date/Time:  Wednesday June 27 2023 14:45:54 EDT Ventricular Rate:  85 PR Interval:  174 QRS Duration:  72 QT Interval:  382 QTC Calculation: 454 R Axis:   23  Text Interpretation: Normal sinus rhythm with sinus arrhythmia Nonspecific T wave abnormality When compared with ECG of 07-May-2023 21:42, PREVIOUS ECG IS PRESENT Confirmed by Gypsy Balsam [phone]) on 06/27/2023 2:46:35 PM    Recent Labs: 07/05/2022: TSH 0.90 12/06/2022: B Natriuretic Peptide 51.9 04/18/2023: Magnesium 2.1 06/20/2023: ALT 44; BUN 9; Creatinine, Ser 0.74; Hemoglobin 12.7; Platelets 193; Potassium 3.7; Sodium 138  Recent Lipid Panel    Component Value Date/Time   CHOL 184 04/13/2023 0642   CHOL 213 (H) 12/01/2021 1410   TRIG 287 (H) 04/17/2023 0456   HDL 22 (L) 04/13/2023 0642   HDL 30 (L) 12/01/2021 1410   CHOLHDL 8.4 04/13/2023 0642   VLDL 38 04/13/2023 0642   LDLCALC 124 (H) 04/13/2023 0642   LDLCALC 156 (H) 12/01/2021 1410    Physical Exam:    VS:  BP (!) 146/82 (BP Location: Left Arm, Patient Position: Sitting)   Pulse 82   Ht 5\' 4"  (1.626 m)   Wt 146 lb (66.2 kg)   SpO2 100%   BMI 25.06 kg/m     Wt Readings from Last 3 Encounters:  06/27/23 146 lb (66.2 kg)  06/20/23 149 lb 14.6 oz (68 kg)  06/08/23 150 lb (68 kg)     GEN:  Well nourished, well developed in no acute distress HEENT: Normal NECK: No JVD; No carotid bruits LYMPHATICS: No lymphadenopathy CARDIAC: RRR, no murmurs, no rubs, no gallops RESPIRATORY:  Clear to  auscultation without rales, wheezing or rhonchi  ABDOMEN: Soft, non-tender, non-distended MUSCULOSKELETAL:  No edema; No deformity  SKIN: Warm and dry NEUROLOGIC:  Alert and oriented x 3 PSYCHIATRIC:  Normal affect   ASSESSMENT:    1. Essential hypertension   2. PFO (patent foramen ovale)   3. History of CVA (cerebrovascular accident)   4. Diastolic dysfunction    PLAN:    In order of problems listed above:  History of CVA multiple very complex situation.  She is anticoagulated right now with Eliquis.  She does have PFO and of course concern is that PFO could be responsible for her strokes. So I spoke to her mother about possibility of need to close the defect.  Hemodynamic is insignificant but the fact that she got strokes multiple with DVT make me suspicious that that may be source of stroke.  The question also will be how long we want to anticoagulate her.  I think we can blame her DVT on immobility of left lower extremity.  If we decide to continue indefinitely then closing PFO became less important but if we talking in the future about stopping anticoagulation then consider closing PFO. Potential etiology of stroke again PFO is the first suspected second potential atrial fibrillation she does have significant LVH which of course make her more like to have atrial fibrillation I will ask her to wear a monitor for 1 month to see if we catch any atrial fibrillation that way she did have discussion about implantable loop order in 2018 but she refused to have it.  Will continue this discussion. Essential hypertension blood pressure slightly elevated today first visit in my office will continue present management for now. History of significant hypertrophic cardiomyopathy which probably was related to longstanding high blood pressure in the future we will consider MRI. Dyslipidemia her LDL was 124 HDL 22 only this is from general 24 2025.  She has been put on Lipitor 80 and Zetia.  Will check her  fasting lipid profile   Medication Adjustments/Labs and Tests Ordered: Current medicines are reviewed at length with the patient today.  Concerns regarding medicines are outlined above.  Orders Placed This Encounter  Procedures   EKG 12-Lead   No orders of the defined types were placed in this encounter.   Signed, Georgeanna Lea, MD, [facility]. 06/27/2023 3:08 PM    [facility]

## 2023-06-27 NOTE — Telephone Encounter (Signed)
 Copied from CRM [id number]. Topic: Referral - Request for Referral >> Jun 27, 2023  9:53 AM [place] J wrote: Did the patient discuss referral with their provider in the last year? Yes (If No - schedule appointment) (If Yes - send message)  Appointment offered? Yes  Type of order/referral and detailed reason for visit:  Speech Therapy Home Health,  Original referral for speech therapy could not be done due to the speech therapist the patient was seeing switching to a different position.  Preference of office, provider, location: [facility]  If referral order, have you been seen by this specialty before? No (If Yes, this issue or another issue? When? Where?  Can we respond through MyChart? Yes  Patient's case manage called in regarding referral. [employer] Case Manager: Emeterio Reeve

## 2023-06-28 ENCOUNTER — Ambulatory Visit: Attending: Cardiology

## 2023-06-28 ENCOUNTER — Ambulatory Visit

## 2023-06-28 DIAGNOSIS — R002 Palpitations: Secondary | ICD-10-CM

## 2023-06-29 DIAGNOSIS — E538 Deficiency of other specified B group vitamins: Secondary | ICD-10-CM | POA: Diagnosis not present

## 2023-06-29 DIAGNOSIS — I69398 Other sequelae of cerebral infarction: Secondary | ICD-10-CM | POA: Diagnosis not present

## 2023-06-29 DIAGNOSIS — K449 Diaphragmatic hernia without obstruction or gangrene: Secondary | ICD-10-CM | POA: Diagnosis not present

## 2023-06-29 DIAGNOSIS — F419 Anxiety disorder, unspecified: Secondary | ICD-10-CM | POA: Diagnosis not present

## 2023-06-29 DIAGNOSIS — K219 Gastro-esophageal reflux disease without esophagitis: Secondary | ICD-10-CM | POA: Diagnosis not present

## 2023-06-29 DIAGNOSIS — R3981 Functional urinary incontinence: Secondary | ICD-10-CM | POA: Diagnosis not present

## 2023-06-29 DIAGNOSIS — R7303 Prediabetes: Secondary | ICD-10-CM | POA: Diagnosis not present

## 2023-06-29 DIAGNOSIS — I119 Hypertensive heart disease without heart failure: Secondary | ICD-10-CM | POA: Diagnosis not present

## 2023-06-29 DIAGNOSIS — M24111 Other articular cartilage disorders, right shoulder: Secondary | ICD-10-CM | POA: Diagnosis not present

## 2023-06-29 DIAGNOSIS — H5347 Heteronymous bilateral field defects: Secondary | ICD-10-CM | POA: Diagnosis not present

## 2023-06-29 DIAGNOSIS — E7800 Pure hypercholesterolemia, unspecified: Secondary | ICD-10-CM | POA: Diagnosis not present

## 2023-06-29 DIAGNOSIS — M65312 Trigger thumb, left thumb: Secondary | ICD-10-CM | POA: Diagnosis not present

## 2023-06-29 DIAGNOSIS — D649 Anemia, unspecified: Secondary | ICD-10-CM | POA: Diagnosis not present

## 2023-06-29 DIAGNOSIS — G629 Polyneuropathy, unspecified: Secondary | ICD-10-CM | POA: Diagnosis not present

## 2023-06-29 DIAGNOSIS — M67432 Ganglion, left wrist: Secondary | ICD-10-CM | POA: Diagnosis not present

## 2023-06-29 DIAGNOSIS — N8110 Cystocele, unspecified: Secondary | ICD-10-CM | POA: Diagnosis not present

## 2023-06-29 DIAGNOSIS — I69351 Hemiplegia and hemiparesis following cerebral infarction affecting right dominant side: Secondary | ICD-10-CM | POA: Diagnosis not present

## 2023-06-29 DIAGNOSIS — N179 Acute kidney failure, unspecified: Secondary | ICD-10-CM | POA: Diagnosis not present

## 2023-06-29 DIAGNOSIS — E876 Hypokalemia: Secondary | ICD-10-CM | POA: Diagnosis not present

## 2023-06-29 DIAGNOSIS — M1990 Unspecified osteoarthritis, unspecified site: Secondary | ICD-10-CM | POA: Diagnosis not present

## 2023-06-29 DIAGNOSIS — I10 Essential (primary) hypertension: Secondary | ICD-10-CM | POA: Diagnosis not present

## 2023-06-29 DIAGNOSIS — I82411 Acute embolism and thrombosis of right femoral vein: Secondary | ICD-10-CM | POA: Diagnosis not present

## 2023-06-29 DIAGNOSIS — I639 Cerebral infarction, unspecified: Secondary | ICD-10-CM | POA: Diagnosis not present

## 2023-06-29 DIAGNOSIS — E785 Hyperlipidemia, unspecified: Secondary | ICD-10-CM | POA: Diagnosis not present

## 2023-06-29 DIAGNOSIS — I69320 Aphasia following cerebral infarction: Secondary | ICD-10-CM | POA: Diagnosis not present

## 2023-06-29 DIAGNOSIS — M62838 Other muscle spasm: Secondary | ICD-10-CM | POA: Diagnosis not present

## 2023-06-29 DIAGNOSIS — I700 Atherosclerosis of aorta: Secondary | ICD-10-CM | POA: Diagnosis not present

## 2023-06-29 NOTE — Telephone Encounter (Signed)
 Referral coordinator states that this place does not have speech therapy. Called patient and LVM to see where she wants new referral to go. Awaiting call back.

## 2023-07-02 ENCOUNTER — Encounter: Admitting: Obstetrics and Gynecology

## 2023-07-03 ENCOUNTER — Telehealth: Payer: Self-pay | Admitting: *Deleted

## 2023-07-03 DIAGNOSIS — I63512 Cerebral infarction due to unspecified occlusion or stenosis of left middle cerebral artery: Secondary | ICD-10-CM | POA: Diagnosis not present

## 2023-07-03 NOTE — Telephone Encounter (Signed)
 We reached out to pt to have her return to complete the alkaline phosphatase isoenzyme level. She reports it is very difficult for her to return to the office due to her decreased mobility after previous stroke. Adel Holt said there is a mobile lab service in [place] [employer]) that could travel to her home and collect the specimen.  I verified that this company works with [facility] and [facility]. Are you ok for me to try and set this up for her?

## 2023-07-04 DIAGNOSIS — I63512 Cerebral infarction due to unspecified occlusion or stenosis of left middle cerebral artery: Secondary | ICD-10-CM | POA: Diagnosis not present

## 2023-07-04 NOTE — Telephone Encounter (Signed)
 Okay to wait until next office visit if needed. Thank you!

## 2023-07-05 ENCOUNTER — Telehealth: Payer: Self-pay | Admitting: Family Medicine

## 2023-07-05 ENCOUNTER — Other Ambulatory Visit: Payer: Self-pay | Admitting: *Deleted

## 2023-07-05 DIAGNOSIS — I63512 Cerebral infarction due to unspecified occlusion or stenosis of left middle cerebral artery: Secondary | ICD-10-CM | POA: Diagnosis not present

## 2023-07-05 DIAGNOSIS — M79606 Pain in leg, unspecified: Secondary | ICD-10-CM

## 2023-07-05 NOTE — Telephone Encounter (Signed)
 Left message for pt to return my call.  Schedule pt OV in 3 months and cancel lab appt for next week.

## 2023-07-05 NOTE — Telephone Encounter (Signed)
 Copied from CRM [id number]. Topic: General - Call Back - No Documentation >> Jul 05, 2023  4:50 PM Alpha Arts wrote: Reason for CRM: Patient's mother, Angela Barban would like a call back to discuss alkaline test  Callback #: [phone]

## 2023-07-06 DIAGNOSIS — I63512 Cerebral infarction due to unspecified occlusion or stenosis of left middle cerebral artery: Secondary | ICD-10-CM | POA: Diagnosis not present

## 2023-07-09 ENCOUNTER — Encounter: Payer: Self-pay | Admitting: *Deleted

## 2023-07-09 ENCOUNTER — Telehealth: Payer: Self-pay | Admitting: Neurology

## 2023-07-09 DIAGNOSIS — I63512 Cerebral infarction due to unspecified occlusion or stenosis of left middle cerebral artery: Secondary | ICD-10-CM | POA: Diagnosis not present

## 2023-07-09 NOTE — Telephone Encounter (Signed)
 We just received request from [facility] to approve visits for Speech therapy - so they are already set to see patient for 9 weeks.   We have discussed PT and her insurance is not approving this because they are using their visits for speech therapy. See previous notes.

## 2023-07-09 NOTE — Telephone Encounter (Signed)
 Spoke with pt's mom, she would like to keep lab appt for tomorrow.

## 2023-07-09 NOTE — Telephone Encounter (Signed)
 Called and LVM giving VO for speak therapy.   Copied from CRM [id number]. Topic: Clinical - Home Health Verbal Orders >> Jul 06, 2023  4:57 PM Keitha Pata L wrote: Caller/Agency: christie/[facility] Callback Number: [phone] Service Requested: Speech Therapy Frequency: 1 week 9 Any new concerns about the patient? No

## 2023-07-09 NOTE — Telephone Encounter (Signed)
 Notified pt's mom of Taylor's recommendation based on previous conversation and stated difficulty getting pt back in the office.  Pt's mom states she would rather go ahead and bring pt back in for lab appt tomorrow. She did schedule follow up for 10/03/23 at 2pm

## 2023-07-09 NOTE — Telephone Encounter (Signed)
 Opened in error

## 2023-07-09 NOTE — Telephone Encounter (Signed)
 Spoke with pt's mom (Mrs Hines Ludwig), she is asking for referral to another home health agency (due to [facility] not having a speech therapist currently). She doesn't know of another agency and is asking PCP to choose one "with good speech therapist" that is local to our area. Also wonders if insurance will continue to approve PT and feels pt could benefit from additional PT.  Please place referral if appropriate. I pended a new referral if appropriate.  Please review and make any necessary changes.

## 2023-07-09 NOTE — Telephone Encounter (Signed)
 Called and LVM for Brittany [name] to confirm she is going to see patient for ST. We are getting conflicting information about speech therapy from [facility] and patient.

## 2023-07-09 NOTE — Addendum Note (Signed)
 Addended by: Susa Engman A on: 07/09/2023 09:00 AM   Modules accepted: Orders

## 2023-07-10 ENCOUNTER — Telehealth: Payer: Self-pay | Admitting: Family Medicine

## 2023-07-10 ENCOUNTER — Other Ambulatory Visit: Payer: Self-pay | Admitting: Family Medicine

## 2023-07-10 ENCOUNTER — Other Ambulatory Visit

## 2023-07-10 DIAGNOSIS — R748 Abnormal levels of other serum enzymes: Secondary | ICD-10-CM

## 2023-07-10 DIAGNOSIS — I63512 Cerebral infarction due to unspecified occlusion or stenosis of left middle cerebral artery: Secondary | ICD-10-CM | POA: Diagnosis not present

## 2023-07-10 DIAGNOSIS — E7800 Pure hypercholesterolemia, unspecified: Secondary | ICD-10-CM

## 2023-07-10 NOTE — Addendum Note (Signed)
 Addended by: Susa Engman A on: 07/10/2023 01:24 PM   Modules accepted: Orders

## 2023-07-10 NOTE — Telephone Encounter (Signed)
 Not prescribed here. Okay to send?

## 2023-07-10 NOTE — Telephone Encounter (Signed)
 Pt came in office stating needing refill on linaclotide  (LINZESS ) 72 MCG capsule sent to pharmacy at:   [facility] - [place], [place] - [address] [place], [place] [address] Phone: [phone]  Fax: [phone] DEA #: [id number]  Please advise.

## 2023-07-10 NOTE — Telephone Encounter (Signed)
 Copied from CRM [id number]. Topic: Referral - Status >> Jul 10, 2023 11:59 AM Martin[place] E wrote: Reason for CRM: Shatara from [employer] called wanting a status on patient's speech therapy referral. Relayed to Talbot Factor that the referral status is "pending review," but she was wanting more clarification. Callback number [phone] to discuss.

## 2023-07-10 NOTE — Telephone Encounter (Signed)
 Awaiting call back from [facility]. See phone note from yesterday.

## 2023-07-11 DIAGNOSIS — I63512 Cerebral infarction due to unspecified occlusion or stenosis of left middle cerebral artery: Secondary | ICD-10-CM | POA: Diagnosis not present

## 2023-07-11 LAB — LIPID PANEL
Chol/HDL Ratio: 2.7 ratio (ref 0.0–4.4)
Cholesterol, Total: 92 mg/dL — ABNORMAL LOW (ref 100–199)
HDL: 34 mg/dL — ABNORMAL LOW (ref >39)
LDL Chol Calc (NIH): 39 mg/dL (ref 0–99)
Triglycerides: 99 mg/dL (ref 0–149)
VLDL Cholesterol Cal: 19 mg/dL (ref 5–40)

## 2023-07-11 LAB — ALT: ALT: 29 IU/L (ref 0–32)

## 2023-07-11 LAB — AST: AST: 24 IU/L (ref 0–40)

## 2023-07-12 ENCOUNTER — Other Ambulatory Visit: Payer: Self-pay

## 2023-07-12 ENCOUNTER — Encounter (HOSPITAL_BASED_OUTPATIENT_CLINIC_OR_DEPARTMENT_OTHER): Payer: Self-pay | Admitting: Emergency Medicine

## 2023-07-12 ENCOUNTER — Emergency Department (HOSPITAL_BASED_OUTPATIENT_CLINIC_OR_DEPARTMENT_OTHER)

## 2023-07-12 ENCOUNTER — Telehealth: Payer: Self-pay | Admitting: Cardiology

## 2023-07-12 ENCOUNTER — Emergency Department (HOSPITAL_BASED_OUTPATIENT_CLINIC_OR_DEPARTMENT_OTHER)
Admission: EM | Admit: 2023-07-12 | Discharge: 2023-07-12 | Disposition: A | Discharge status: Home / Self Care | Attending: Emergency Medicine | Admitting: Emergency Medicine

## 2023-07-12 DIAGNOSIS — I63512 Cerebral infarction due to unspecified occlusion or stenosis of left middle cerebral artery: Secondary | ICD-10-CM | POA: Diagnosis not present

## 2023-07-12 DIAGNOSIS — R059 Cough, unspecified: Secondary | ICD-10-CM | POA: Insufficient documentation

## 2023-07-12 DIAGNOSIS — F801 Expressive language disorder: Secondary | ICD-10-CM | POA: Diagnosis not present

## 2023-07-12 DIAGNOSIS — I10 Essential (primary) hypertension: Secondary | ICD-10-CM | POA: Diagnosis not present

## 2023-07-12 DIAGNOSIS — R29818 Other symptoms and signs involving the nervous system: Secondary | ICD-10-CM | POA: Diagnosis not present

## 2023-07-12 DIAGNOSIS — Z8673 Personal history of transient ischemic attack (TIA), and cerebral infarction without residual deficits: Secondary | ICD-10-CM | POA: Insufficient documentation

## 2023-07-12 DIAGNOSIS — Z7901 Long term (current) use of anticoagulants: Secondary | ICD-10-CM | POA: Diagnosis not present

## 2023-07-12 DIAGNOSIS — Z91040 Latex allergy status: Secondary | ICD-10-CM | POA: Insufficient documentation

## 2023-07-12 DIAGNOSIS — R2990 Unspecified symptoms and signs involving the nervous system: Secondary | ICD-10-CM

## 2023-07-12 DIAGNOSIS — J029 Acute pharyngitis, unspecified: Secondary | ICD-10-CM | POA: Diagnosis not present

## 2023-07-12 DIAGNOSIS — M4802 Spinal stenosis, cervical region: Secondary | ICD-10-CM | POA: Diagnosis not present

## 2023-07-12 DIAGNOSIS — M47812 Spondylosis without myelopathy or radiculopathy, cervical region: Secondary | ICD-10-CM | POA: Diagnosis not present

## 2023-07-12 DIAGNOSIS — R531 Weakness: Secondary | ICD-10-CM | POA: Diagnosis not present

## 2023-07-12 LAB — COMPREHENSIVE METABOLIC PANEL WITH GFR
ALT: 35 U/L (ref 0–44)
AST: 30 U/L (ref 15–41)
Albumin: 4.7 g/dL (ref 3.5–5.0)
Alkaline Phosphatase: 144 U/L — ABNORMAL HIGH (ref 38–126)
Anion gap: 14 (ref 5–15)
BUN: 10 mg/dL (ref 6–20)
CO2: 22 mmol/L (ref 22–32)
Calcium: 9.9 mg/dL (ref 8.9–10.3)
Chloride: 104 mmol/L (ref 98–111)
Creatinine, Ser: 1.05 mg/dL — ABNORMAL HIGH (ref 0.44–1.00)
GFR, Estimated: >60 mL/min (ref >60)
Glucose, Bld: 88 mg/dL (ref 70–99)
Potassium: 4.2 mmol/L (ref 3.5–5.1)
Sodium: 139 mmol/L (ref 135–145)
Total Bilirubin: 0.5 mg/dL (ref 0.0–1.2)
Total Protein: 8.1 g/dL (ref 6.5–8.1)

## 2023-07-12 LAB — CBC WITH DIFFERENTIAL/PLATELET
Abs Immature Granulocytes: 0.02 10*3/uL (ref 0.00–0.07)
Basophils Absolute: 0 10*3/uL (ref 0.0–0.1)
Basophils Relative: 0 %
Eosinophils Absolute: 0.1 10*3/uL (ref 0.0–0.5)
Eosinophils Relative: 1 %
HCT: 38.8 % (ref 36.0–46.0)
Hemoglobin: 12.8 g/dL (ref 12.0–15.0)
Immature Granulocytes: 0 %
Lymphocytes Relative: 46 %
Lymphs Abs: 3.8 10*3/uL (ref 0.7–4.0)
MCH: 28.4 pg (ref 26.0–34.0)
MCHC: 33 g/dL (ref 30.0–36.0)
MCV: 86.2 fL (ref 80.0–100.0)
Monocytes Absolute: 0.8 10*3/uL (ref 0.1–1.0)
Monocytes Relative: 9 %
Neutro Abs: 3.7 10*3/uL (ref 1.7–7.7)
Neutrophils Relative %: 44 %
Platelets: 223 10*3/uL (ref 150–400)
RBC: 4.5 MIL/uL (ref 3.87–5.11)
RDW: 15.2 % (ref 11.5–15.5)
WBC: 8.4 10*3/uL (ref 4.0–10.5)
nRBC: 0 % (ref 0.0–0.2)

## 2023-07-12 LAB — CBG MONITORING, ED: Glucose-Capillary: 94 mg/dL (ref 70–99)

## 2023-07-12 LAB — PROTIME-INR
INR: 1.2 (ref 0.8–1.2)
Prothrombin Time: 15.6 s — ABNORMAL HIGH (ref 11.4–15.2)

## 2023-07-12 LAB — RESP PANEL BY RT-PCR (RSV, FLU A&B, COVID)  RVPGX2
Influenza A by PCR: NEGATIVE
Influenza B by PCR: NEGATIVE
Resp Syncytial Virus by PCR: NEGATIVE
SARS Coronavirus 2 by RT PCR: NEGATIVE

## 2023-07-12 LAB — GROUP A STREP BY PCR: Group A Strep by PCR: NOT DETECTED

## 2023-07-12 MED ORDER — IOHEXOL 350 MG/ML SOLN
75.0000 mL | Freq: Once | INTRAVENOUS | Status: AC | PRN
  Administered 2023-07-12: 100 mL via INTRAVENOUS

## 2023-07-12 MED ORDER — IOHEXOL 300 MG/ML  SOLN: 75.0000 mL | Freq: Once | INTRAMUSCULAR | Status: DC | PRN

## 2023-07-12 MED ORDER — LINACLOTIDE 72 MCG PO CAPS: 72.0000 ug | ORAL_CAPSULE | ORAL | 0 refills | Status: DC

## 2023-07-12 NOTE — ED Notes (Signed)
 Pt was able to go to the bathroom without incident. Mother took her in a wheelchair.

## 2023-07-12 NOTE — Telephone Encounter (Signed)
 LVM to return call.

## 2023-07-12 NOTE — ED Provider Notes (Signed)
 [facility] Provider Note   CSN: [id number] Arrival date & time: 07/12/23  1646     History  Chief Complaint  Patient presents with   Altered Mental Status   Sore Throat    Brittany [name] is a 58 y.o. female.  Patient here with several issues.  Per family they are concerned about a mini stroke because she is more generally weak.  The cannot tell if there is more left-sided weakness than normal.  She has had some sore throat.  She has some acute on chronic neck pain, may be some pain with urination.  The concern for stroke, UTI, may be throat infection.  She denies any falls.  She had stroke couple months ago.  Left her with speech issues and some trace left-sided weakness per family.  She is on Eliquis .  Cannot really tell me when the last known normal was seems like maybe last night.  The history is provided by the patient.       Home Medications Prior to Admission medications   Medication Sig Start Date End Date Taking? Authorizing Provider  amLODipine  (NORVASC ) 10 MG tablet Take 1 tablet (10 mg total) by mouth daily. 05/28/23 06/27/23  Everlina Hock, NP  apixaban  (ELIQUIS ) 5 MG TABS tablet Take 1 tablet (5 mg total) by mouth 2 (two) times daily. 05/28/23   Everlina Hock, NP  atorvastatin  (LIPITOR ) 80 MG tablet Take 1 tablet (80 mg total) by mouth daily with supper. 05/28/23 05/27/24  Everlina Hock, NP  cholecalciferol  (CHOLECALCIFEROL ) 25 MCG tablet Take 1 tablet (1,000 Units total) by mouth daily. 04/21/23   Lehner, Erin C, NP  docusate sodium  (COLACE) 100 MG capsule Take 1 capsule (100 mg total) by mouth 2 (two) times daily. 05/03/23   Love, Renay Carota, PA-C  ezetimibe  (ZETIA ) 10 MG tablet Take 1 tablet (10 mg total) by mouth daily. 05/28/23   Everlina Hock, NP  gabapentin  (NEURONTIN ) 300 MG capsule TAKE 1 CAPSULE (300 MG TOTAL) BY MOUTH 3 (THREE) TIMES DAILY. 06/25/23   Everlina Hock, NP  hydrALAZINE  (APRESOLINE ) 50 MG tablet Take 1  tablet (50 mg total) by mouth every 8 (eight) hours. 12/09/22 06/27/23  Arrien, Curlee Doss, MD  linaclotide  (LINZESS ) 72 MCG capsule Take 1 capsule (72 mcg total) by mouth every 3 (three) days. 07/12/23   Everlina Hock, NP  lisinopril  (ZESTRIL ) 2.5 MG tablet Take 1 tablet (2.5 mg total) by mouth in the morning and at bedtime. 05/28/23   Everlina Hock, NP  pantoprazole  (PROTONIX ) 40 MG tablet Take 1 tablet (40 mg total) by mouth daily. 06/01/23   Everlina Hock, NP  potassium chloride  (KLOR-CON  10) 10 MEQ tablet Take 2 tablets (20 mEq total) by mouth 2 (two) times daily. 05/07/23   Everlina Hock, NP  senna-docusate (SENOKOT-S) 8.6-50 MG tablet Take 2 tablets by mouth daily at 6 (six) AM. 06/07/23   Lylia Sand, MD  ticagrelor  (BRILINTA ) 90 MG TABS tablet Take 0.5 tablets (45 mg total) by mouth 2 (two) times daily. 05/28/23   Everlina Hock, NP  valACYclovir  (VALTREX ) 500 MG tablet Take 1 tablet (500 mg total) by mouth 2 (two) times daily. 06/08/23   O'Sullivan, Melissa, NP      Allergies    Amitriptyline , Duloxetine , Naproxen , Other, Beef-derived drug products, Pork-derived products, Darvon [propoxyphene], Hydrocodone , Lactose intolerance (gi), Latex, Oxycodone , Percocet [oxycodone -acetaminophen ], and Topamax  [topiramate ]    Review of Systems  Review of Systems  Physical Exam Updated Vital Signs BP (!) 125/90   Pulse 67   Temp 99.1 F (37.3 C)   Resp 18   Ht 5\' 4"  (1.626 m)   Wt 67.1 kg   SpO2 98%   BMI 25.40 kg/m  Physical Exam Vitals and nursing note reviewed.  Constitutional:      General: She is not in acute distress.    Appearance: She is well-developed.  HENT:     Head: Normocephalic and atraumatic.     Right Ear: Tympanic membrane normal.     Left Ear: Tympanic membrane normal.     Nose: No congestion or rhinorrhea.     Mouth/Throat:     Mouth: Mucous membranes are moist. No oral lesions.     Pharynx: No pharyngeal swelling, oropharyngeal exudate or posterior  oropharyngeal erythema.     Tonsils: No tonsillar exudate or tonsillar abscesses.  Eyes:     Extraocular Movements:     Right eye: Normal extraocular motion.     Left eye: Normal extraocular motion.     Conjunctiva/sclera: Conjunctivae normal.  Cardiovascular:     Rate and Rhythm: Normal rate and regular rhythm.     Heart sounds: Normal heart sounds. No murmur heard. Pulmonary:     Effort: Pulmonary effort is normal. No respiratory distress.     Breath sounds: Normal breath sounds.  Abdominal:     Palpations: Abdomen is soft.     Tenderness: There is no abdominal tenderness.  Musculoskeletal:        General: No swelling.     Cervical back: Normal range of motion and neck supple.  Skin:    General: Skin is warm and dry.     Capillary Refill: Capillary refill takes less than 2 seconds.  Neurological:     General: No focal deficit present.     Mental Status: She is alert.     Comments: Trace weakness in the left upper extremity and left lower extremity, expressive aphasia but otherwise normal exam normal sensation normal visual fields, normal coordination  Psychiatric:        Mood and Affect: Mood normal.     ED Results / Procedures / Treatments   Labs (all labs ordered are listed, but only abnormal results are displayed) Labs Reviewed  COMPREHENSIVE METABOLIC PANEL WITH GFR - Abnormal; Notable for the following components:      Result Value   Creatinine, Ser 1.05 (*)    Alkaline Phosphatase 144 (*)    All other components within normal limits  PROTIME-INR - Abnormal; Notable for the following components:   Prothrombin Time 15.6 (*)    All other components within normal limits  GROUP A STREP BY PCR  RESP PANEL BY RT-PCR (RSV, FLU A&B, COVID)  RVPGX2  CBC WITH DIFFERENTIAL/PLATELET  CBG MONITORING, ED    EKG EKG Interpretation Date/Time:  Thursday July 12 2023 16:56:37 EDT Ventricular Rate:  91 PR Interval:  174 QRS Duration:  91 QT Interval:  370 QTC  Calculation: 456 R Axis:   20  Text Interpretation: Sinus rhythm Confirmed by Lowery Rue [phone]) on 07/12/2023 5:11:39 PM  Radiology CT ANGIO HEAD NECK W WO CM Result Date: 07/12/2023 CLINICAL DATA:  Acute neurologic deficit EXAM: CT ANGIOGRAPHY HEAD AND NECK WITH AND WITHOUT CONTRAST TECHNIQUE: Multidetector CT imaging of the head and neck was performed using the standard protocol during bolus administration of intravenous contrast. Multiplanar CT image reconstructions and MIPs were obtained to evaluate the  vascular anatomy. Carotid stenosis measurements (when applicable) are obtained utilizing NASCET criteria, using the distal internal carotid diameter as the denominator. RADIATION DOSE REDUCTION: This exam was performed according to the departmental dose-optimization program which includes automated exposure control, adjustment of the mA and/or kV according to patient size and/or use of iterative reconstruction technique. CONTRAST:  OMNIPAQUE  IOHEXOL  350 MG/ML SOLN COMPARISON:  None Available. FINDINGS: CT HEAD FINDINGS Brain: Old left MCA territory infarct. No acute hemorrhage. White matter hypoattenuation. Vascular: Stent in the left MCA. Skull: The visualized skull base, calvarium and extracranial soft tissues are normal. Sinuses/Orbits: No fluid levels or advanced mucosal thickening of the visualized paranasal sinuses. No mastoid or middle ear effusion. Normal orbits. CTA NECK FINDINGS Skeleton: No acute abnormality or high grade bony spinal canal stenosis. Other neck: Normal pharynx, larynx and major salivary glands. No cervical lymphadenopathy. Unremarkable thyroid  gland. Upper chest: No pneumothorax or pleural effusion. No nodules or masses. Aortic arch: There is no calcific atherosclerosis of the aortic arch. Conventional 3 vessel aortic branching pattern. RIGHT carotid system: Normal without aneurysm, dissection or stenosis. LEFT carotid system: Normal without aneurysm, dissection or  stenosis. Vertebral arteries: Right dominant configuration. 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 Normal left vertebral artery. CTA HEAD FINDINGS POSTERIOR CIRCULATION: Vertebral arteries are normal. No proximal occlusion of the anterior or inferior cerebellar arteries. Basilar artery is normal. Superior cerebellar arteries are normal. Posterior cerebral arteries are normal. ANTERIOR CIRCULATION: Intracranial internal carotid arteries are normal. Anterior cerebral arteries are normal. Patent left MCA stent. Middle cerebral arteries are normal. Venous sinuses: As permitted by contrast timing, patent. Anatomic variants: None Review of the MIP images confirms the above findings. IMPRESSION: 1. No emergent large vessel occlusion. 2. Patent left MCA stent. 3. 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 4. Old left MCA territory infarct. Electronically Signed   By: Juanetta Nordmann M.D.   On: 07/12/2023 20:43   CT C-SPINE NO CHARGE Result Date: 07/12/2023 CLINICAL DATA:  Neuro deficit stroke suspected EXAM: CT Cervical Spine without contrast TECHNIQUE: Multiplanar CT images of the cervical spine were reconstructed from contemporary CT of the Neck. RADIATION DOSE REDUCTION: This exam was performed according to the departmental dose-optimization program which includes automated exposure control, adjustment of the mA and/or kV according to patient size and/or use of iterative reconstruction technique. CONTRAST:  None or No additional COMPARISON:  04/12/2023 FINDINGS: Alignment: Reversal of cervical lordosis. No subluxation. Facet alignment within normal limits Skull base and vertebrae: Normal vertebral body stature. No obvious fracture Soft tissues and spinal canal: No prevertebral fluid or swelling. No visible canal hematoma. Disc levels:  Diffuse disc space narrowing and degenerative change. Upper chest: Lung apices are clear  Other: None IMPRESSION: Reversal of cervical lordosis with diffuse degenerative change. No acute osseous abnormality. Electronically Signed   By: Esmeralda Hedge M.D.   On: 07/12/2023 20:01   DG Chest 2 View Result Date: 07/12/2023 CLINICAL DATA:  Cough. EXAM: CHEST - 2 VIEW COMPARISON:  May 07, 2023. FINDINGS: The heart size and mediastinal contours are within normal limits. Both lungs are clear. The visualized skeletal structures are unremarkable. IMPRESSION: No active cardiopulmonary disease. Electronically Signed   By: Rosalene Colon M.D.   On: 07/12/2023 18:31    Procedures Procedures    Medications Ordered in ED Medications  iohexol  (OMNIPAQUE ) 350 MG/ML injection 75 mL (100 mLs Intravenous Contrast Given 07/12/23 1823)  ED Course/ Medical Decision Making/ A&P                                 Medical Decision Making Amount and/or Complexity of Data Reviewed Labs: ordered. Radiology: ordered.  Risk Prescription drug management.   Brittany [name] is here with multiple issues.  Normal vitals.  No fever.  Differential diagnosis possibly chronic pain process as reports that she does have some chronic pain in her neck at times but they do endorse maybe new weakness but somewhat nonfocal.  Hard to really know her baseline exam but I do appreciate may have some trace weakness in the left side, she has chronic expressive aphasia.  Differential diagnosis seems less likely to be stroke but will get a CTA of the head and neck and may be consider MRI at [facility].  May be also complaining of sore throat and urine discomfort.  Will check for COVID flu strep urine infection.  Will check basic labs.  She does not appear to have any major throat problem on exam.  There is no submandibular swelling there is no swelling of the oropharynx and overall throat exam is unremarkable.  She is controlling her secretions.  No trismus.  Per my review and interpretation of labs no significant  leukocytosis anemia or electrolyte abnormality.  No strep throat.  No COVID flu RSV.  No pneumonia or pneumothorax on chest x-ray per my review and interpretation.  EKG shows sinus rhythm.  I have no concern for cardiac process.  CTA of the head and neck unremarkable.  No emergent large vessel occlusion.  Patent left MCA stent.  No obvious acute process on that.  Looks like there is no obvious soft tissue process in the neck as well in those images.  She has no trismus no drooling.  Overall I did offer transfer to [facility] for MRI to further rule out stroke but they declined.  They understand the risks and benefits.  Overall patient was discharged.  I had prolonged discussion with family about getting MRI to further evaluate for any new stroke given her symptoms.  They declined at this time.  They understand to return if they change her mind.  Discharged in good condition.  This chart was dictated using voice recognition software.  Despite best efforts to proofread,  errors can occur which can change the documentation meaning.         Final Clinical Impression(s) / ED Diagnoses Final diagnoses:  Stroke-like symptoms  Sore throat    Rx / DC Orders ED Discharge Orders     None         Lowery Rue, DO 07/12/23 2154

## 2023-07-12 NOTE — Telephone Encounter (Signed)
 Pt mother called in stating pt had a pain in her neck and believes she may have had a mini stroke. She states pt is also fatigue. 128/75 was her bp last night when her neck was painful. Please advise.

## 2023-07-12 NOTE — ED Notes (Signed)
 Pt's urine sample is in the lab.

## 2023-07-12 NOTE — Discharge Instructions (Signed)
 Please return if you change your mind about getting MRI.  I recommend going to [facility] if you decide to change your mind about getting an MRI.

## 2023-07-12 NOTE — Telephone Encounter (Signed)
 Called [facility] directly and spoke with clinical coordinator, Starling Eck [phone]. She states their speech therapist Janalyn Me was going "PRN" but has agreed to keep [facility] Raisin for speech therapy and continue treatment. Does not need referral elsewhere for speech.   Called and LVM for [facility] making her aware. To call back with questions.

## 2023-07-12 NOTE — ED Triage Notes (Addendum)
 Pt POV with parents- parents report pt with hx of stroke 3 months ago.   C/o neck pain, coughing yesterday. Now this AM having difficulty with ambulation, poor po intake. Mother reports possible somnolence since 0730 today. LKW 2030 last night.   Pt with hx of expressive aphasia, nodding appropriately in triage.  Gestures that she is having sore throat. Denies fever.

## 2023-07-13 ENCOUNTER — Ambulatory Visit: Payer: Self-pay

## 2023-07-13 ENCOUNTER — Telehealth: Payer: Self-pay

## 2023-07-13 DIAGNOSIS — I63512 Cerebral infarction due to unspecified occlusion or stenosis of left middle cerebral artery: Secondary | ICD-10-CM | POA: Diagnosis not present

## 2023-07-13 LAB — ALKALINE PHOSPHATASE, ISOENZYMES
Alkaline Phosphatase: 159 IU/L — ABNORMAL HIGH (ref 44–121)
BONE FRACTION: 21 % (ref 14–68)
INTESTINAL FRAC.: 4 % (ref 0–18)
LIVER FRACTION: 75 % (ref 18–85)

## 2023-07-13 NOTE — Telephone Encounter (Signed)
-----   Message from Ralene Burger sent at 07/12/2023 10:05 AM EDT ----- Cholesterol good, continue present management

## 2023-07-13 NOTE — Telephone Encounter (Signed)
  Chief Complaint: recent ED visit Symptoms: left side of neck cramping, dry cough/choking, difficulty swallowing Frequency: x 2 days Pertinent Negatives: Patient denies SOB, chest pain Disposition: [] ED /[] Urgent Care (no appt availability in office) / [] Appointment(In office/virtual)/ []  [facility]/ [] Home Care/ [] Refused Recommended Disposition /[] [facility]/ [x]  Follow-up with PCP Additional Notes: Mother, Murlean Armour, calling in for triage and is upset. She states she has been on the phone for 3 hours this morning. Mother insists that the patient did not leave the ER AMA, was in ED for over 5 hours and discharged. She states they suggested that she may have had a mini stroke and they could have an MRI done outpatient but they would have to go right away. She told them they could not go at that time (she states they were wore out). She was instructed to follow up with PCP and get an MRI outpatient. Mother states she feels like they need more help taking care of their daughter at home. Patient is scheduled for May 1st with PCP. Mother began crying while on phone with triage RN. Advised we will keep follow up appointment for May 1st with PCP and clinic will follow up if she needs to be seen sooner. Mother verbalizes understanding to go straight to ED if patient has any new or worsening symptoms.  Copied from CRM [id number]. Topic: Clinical - Red Word Triage >> Jul 13, 2023 10:44 AM Allyne Areola wrote: Red Word that prompted transfer to Nurse Triage: Patient was seen in the ER yesterday due to signs of a stroke. Called [facility] and they would like her to be triaged. Reason for Disposition  [1] Follow-up call from patient regarding patient's clinical status AND [2] information urgent  Answer Assessment - Initial Assessment Questions 1. REASON FOR CALL or QUESTION: "What is your reason for calling today?" or "How can I best help you?" or "What question do you have that I can help answer?"      Mother states she did not call, the clinic called her and told her they need to rescheduled and she is unsure what the problem is. Patient was seen in ED yesterday and has appt with PCP next week. Mother in tears and states she is very stressed out and needs help taking care of her daughter at home. She states she is upset because someone from the clinic told her they left AMA from the ED and needed to speak to the nurse about her symptoms. Mother denies any new symptoms and is agreeable to go straight to ED for any new or worsening symptoms.  2. CALLER: Document the source of call. (e.g., laboratory, patient).     Mother.  Protocols used: PCP Call - No Triage-A-AH

## 2023-07-13 NOTE — Telephone Encounter (Signed)
 Pt was taken to ED for eval per ED note 07-12-23

## 2023-07-13 NOTE — Telephone Encounter (Signed)
 Mother returning call to a nurse for results. She wants to know what lab results you are calling back. [phone]

## 2023-07-13 NOTE — Telephone Encounter (Signed)
LM informing the patient of results(ok per DPR)

## 2023-07-13 NOTE — Telephone Encounter (Signed)
 Spoke with pts mother per DPR- regarding normal cholesterol blood work completed at Dr. Arlyce Lambert office on 07-10-23. She verbalized understanding and had no further questions.

## 2023-07-16 DIAGNOSIS — I63512 Cerebral infarction due to unspecified occlusion or stenosis of left middle cerebral artery: Secondary | ICD-10-CM | POA: Diagnosis not present

## 2023-07-17 ENCOUNTER — Ambulatory Visit (HOSPITAL_BASED_OUTPATIENT_CLINIC_OR_DEPARTMENT_OTHER): Payer: Medicaid Other | Admitting: Cardiology

## 2023-07-17 ENCOUNTER — Ambulatory Visit (HOSPITAL_COMMUNITY)
Admission: RE | Admit: 2023-07-17 | Discharge: 2023-07-17 | Disposition: A | Discharge status: Home / Self Care | Source: Ambulatory Visit | Attending: Vascular Surgery | Admitting: Vascular Surgery

## 2023-07-17 DIAGNOSIS — M79606 Pain in leg, unspecified: Secondary | ICD-10-CM | POA: Diagnosis not present

## 2023-07-17 DIAGNOSIS — I63512 Cerebral infarction due to unspecified occlusion or stenosis of left middle cerebral artery: Secondary | ICD-10-CM | POA: Diagnosis not present

## 2023-07-17 LAB — VAS US ABI WITH/WO TBI
Left ABI: 0.46
Right ABI: 0.76

## 2023-07-18 DIAGNOSIS — I63512 Cerebral infarction due to unspecified occlusion or stenosis of left middle cerebral artery: Secondary | ICD-10-CM | POA: Diagnosis not present

## 2023-07-19 ENCOUNTER — Other Ambulatory Visit

## 2023-07-19 ENCOUNTER — Encounter: Payer: Self-pay | Admitting: Family Medicine

## 2023-07-19 ENCOUNTER — Inpatient Hospital Stay: Admitting: Family Medicine

## 2023-07-19 ENCOUNTER — Ambulatory Visit: Admitting: Family Medicine

## 2023-07-19 VITALS — BP 133/70 | HR 81 | Ht 64.0 in | Wt 145.0 lb

## 2023-07-19 DIAGNOSIS — I10 Essential (primary) hypertension: Secondary | ICD-10-CM | POA: Diagnosis not present

## 2023-07-19 DIAGNOSIS — Z8673 Personal history of transient ischemic attack (TIA), and cerebral infarction without residual deficits: Secondary | ICD-10-CM

## 2023-07-19 DIAGNOSIS — I63512 Cerebral infarction due to unspecified occlusion or stenosis of left middle cerebral artery: Secondary | ICD-10-CM | POA: Diagnosis not present

## 2023-07-19 NOTE — Assessment & Plan Note (Signed)
Blood pressure is at goal for age and co-morbidities.   Recommendations: continue current regimen - BP goal <130/80 - monitor and log blood pressures at home - check around the same time each day in a relaxed setting - Limit salt to <2000 mg/day - Follow DASH eating plan (heart healthy diet) - limit alcohol to 2 standard drinks per day for men and 1 per day for women - avoid tobacco products - get at least 2 hours of regular aerobic exercise weekly Patient aware of signs/symptoms requiring further/urgent evaluation.

## 2023-07-19 NOTE — Assessment & Plan Note (Signed)
 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 Recent ED visit for generalized weakness, primarily left-sided. No acute findings. Speech improving gradually. Scheduled for speech therapy end of May of [facility] is not able to come. - Continue speech therapy, including online resources and home health aide support. - Advise against overexertion to prevent symptom exacerbation.  Difficulty chewing and swallowing related to stroke. Managing by cutting food into small pieces. No frequent choking episodes. Awaiting dentures. - Consume soft foods that are easy to chew and swallow. - Eat slowly to prevent choking. - Plan for speech therapy to address swallowing difficulties. - She is working to get dentures to help with chewing

## 2023-07-19 NOTE — Progress Notes (Signed)
 Acute Office Visit  Subjective:     Patient ID: Brittany [name], female    DOB: 18-Jul-1965, 58 y.o.   MRN: [id number]  Chief Complaint  Patient presents with   Medical Management of Chronic Issues    HPI Patient is in today for ED follow-up.  Discussed the use of AI scribe software for clinical note transcription with the patient, who gave verbal consent to proceed.  History of Present Illness Brittany [name] is a 58 year old female with a history of stroke who presents for follow-up after an emergency department visit for generalized weakness.  She experiences frequent episodes of generalized weakness, particularly on the left side, and soreness in the neck and throat. These episodes often occur after overexertion. She visits the emergency department approximately every two to three weeks following these episodes.  During her most recent emergency department visit on July 12, 2023, she was evaluated for generalized weakness and left-sided symptoms. Diagnostic tests, including blood work, EKG, and imaging studies such as a CTA of the head and neck, showed no acute abnormalities. Her MCA stent on the left side was noted to be in good condition.  She has ongoing symptoms of neck soreness and difficulty with speech and swallowing, attributed to her recent stroke. She manages her diet carefully, eating small, well-chewed pieces of food to avoid choking, and is working on speech therapy exercises at home with the help of a home health aide. She has HH SLP ordered, but still trying to get the first visit set up.   Her cholesterol levels have improved, and she maintains a heart healthy diet.       ROS All review of systems negative except what is listed in the HPI      Objective:    BP 133/70   Pulse 81   Ht 5\' 4"  (1.626 m)   Wt 145 lb (65.8 kg)   SpO2 100%   BMI 24.89 kg/m    Physical Exam Vitals reviewed.  Constitutional:      Appearance: Normal  appearance.  Cardiovascular:     Rate and Rhythm: Normal rate and regular rhythm.     Heart sounds: Normal heart sounds.  Pulmonary:     Effort: Pulmonary effort is normal.     Breath sounds: Normal breath sounds.  Skin:    General: Skin is warm and dry.  Neurological:     Mental Status: She is alert and oriented to person, place, and time.     Cranial Nerves: Dysarthria present.  Psychiatric:        Mood and Affect: Mood normal.        Behavior: Behavior normal.        Thought Content: Thought content normal.        Judgment: Judgment normal.     No results found for any visits on 07/19/23.      Assessment & Plan:   Problem List Items Addressed This Visit       Active Problems   Essential hypertension - Primary   Blood pressure is at goal for age and co-morbidities.   Recommendations: continue current regimen - BP goal <130/80 - monitor and log blood pressures at home - check around the same time each day in a relaxed setting - Limit salt to <2000 mg/day - Follow DASH eating plan (heart healthy diet) - limit alcohol to 2 standard drinks per day for men and 1 per day for women - avoid tobacco products -  get at least 2 hours of regular aerobic exercise weekly Patient aware of signs/symptoms requiring further/urgent evaluation.        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 Recent ED visit for generalized weakness, primarily left-sided. No acute findings. Speech improving gradually. Scheduled for speech therapy end of May of [facility] is not able to come. - Continue speech therapy, including online resources and home health aide support. - Advise against overexertion to prevent symptom exacerbation.  Difficulty chewing and swallowing related to stroke. Managing by cutting food into small pieces. No frequent choking episodes. Awaiting dentures. - Consume soft  foods that are easy to chew and swallow. - Eat slowly to prevent choking. - Plan for speech therapy to address swallowing difficulties. - She is working to get dentures to help with chewing           No orders of the defined types were placed in this encounter.   Return for  - keep next scheduled follow-up .  Everlina Hock, NP

## 2023-07-20 DIAGNOSIS — I63512 Cerebral infarction due to unspecified occlusion or stenosis of left middle cerebral artery: Secondary | ICD-10-CM | POA: Diagnosis not present

## 2023-07-23 ENCOUNTER — Other Ambulatory Visit: Payer: Self-pay | Admitting: Family Medicine

## 2023-07-23 DIAGNOSIS — I10 Essential (primary) hypertension: Secondary | ICD-10-CM

## 2023-07-23 DIAGNOSIS — I63512 Cerebral infarction due to unspecified occlusion or stenosis of left middle cerebral artery: Secondary | ICD-10-CM | POA: Diagnosis not present

## 2023-07-24 ENCOUNTER — Other Ambulatory Visit (HOSPITAL_COMMUNITY): Payer: Self-pay

## 2023-07-24 ENCOUNTER — Telehealth: Payer: Self-pay

## 2023-07-24 DIAGNOSIS — I63512 Cerebral infarction due to unspecified occlusion or stenosis of left middle cerebral artery: Secondary | ICD-10-CM | POA: Diagnosis not present

## 2023-07-24 NOTE — Telephone Encounter (Signed)
 Pharmacy Patient Advocate Encounter   Received notification from CoverMyMeds that prior authorization for Brilinta  90MG  tablets is required/requested.   Insurance verification completed.   The patient is insured through [facility] .   Per test claim: PA required; PA submitted to above mentioned insurance via CoverMyMeds Key/confirmation #/EOC [id number] Status is pending

## 2023-07-25 ENCOUNTER — Ambulatory Visit: Attending: Vascular Surgery | Admitting: Vascular Surgery

## 2023-07-25 ENCOUNTER — Other Ambulatory Visit (HOSPITAL_COMMUNITY): Payer: Self-pay

## 2023-07-25 ENCOUNTER — Encounter: Payer: Self-pay | Admitting: Vascular Surgery

## 2023-07-25 VITALS — BP 149/79 | HR 71 | Temp 97.8°F | Ht 64.0 in | Wt 145.0 lb

## 2023-07-25 DIAGNOSIS — I70213 Atherosclerosis of native arteries of extremities with intermittent claudication, bilateral legs: Secondary | ICD-10-CM | POA: Insufficient documentation

## 2023-07-25 DIAGNOSIS — I63512 Cerebral infarction due to unspecified occlusion or stenosis of left middle cerebral artery: Secondary | ICD-10-CM | POA: Diagnosis not present

## 2023-07-25 NOTE — Telephone Encounter (Signed)
 Pharmacy Patient Advocate Encounter  Received notification from [facility] MEDICAID that Prior Authorization for Brilinta  90MG  tablets has been CANCELLED due to a prior auth not needed at this time   PA #/Case ID/Reference #: [id number]

## 2023-07-25 NOTE — Progress Notes (Signed)
 Patient ID: Brittany [name], female   DOB: November 26, 1965, 58 y.o.   MRN: [id number]  Reason for Consult: New Patient (Initial Visit)   Referred by Everlina Hock, NP  Subjective:     HPI:  Brittany [name] is a 58 y.o. female with history of stroke now walking with help of a walker.  She is able to communicate but uses her father to answer most of the questions.  They state that she does have fairly short distance claudication of the left thigh.  She does not have any rest pain or tissue loss.  She has occasional symptoms in the right leg below the knee and the left leg also below the knee but really cannot walk very far at this time.  She is on Lipitor  and Brilinta  due to recent stroke.  Past Medical History:  Diagnosis Date   Anemia    Arthritis    trigger finger in left hand   Complication of anesthesia    per pt, hard to wake up!   Elevated cholesterol    Female bladder prolapse    per urologist, does not have prolaspe   GERD (gastroesophageal reflux disease)    Heart murmur    pt unsure.   Hiatal hernia    Hypertension    IBS (irritable bowel syndrome)    Multiple myeloma (HCC) 2005   had partial chemo   Peripheral neuropathy    SOB (shortness of breath) on exertion    uses an inhaler   Stroke (HCC) 2017   paralysis left arm/uses a walker   Family History  Problem Relation Age of Onset   Hypertension Mother    Sarcoidosis Mother        currently in remission    Diverticulitis Mother    Irritable bowel syndrome Mother    Liver cancer Mother    Hypertension Father    Stomach cancer Father    Congestive Heart Failure Father    Stroke Maternal Uncle    Scoliosis Brother    Colon cancer Neg Hx    Esophageal cancer Neg Hx    Colon polyps Neg Hx    Rectal cancer Neg Hx    Past Surgical History:  Procedure Laterality Date   BONE BIOPSY  2005   in her back   COLONOSCOPY     over 10 years x3   ESOPHAGOGASTRODUODENOSCOPY     incomplete-over 10 years  ago    IR CT HEAD LTD  04/16/2023   IR CT HEAD LTD  04/16/2023   IR INTRA CRAN STENT  04/16/2023   IR PERCUTANEOUS ART THROMBECTOMY/INFUSION INTRACRANIAL INC DIAG ANGIO  04/16/2023   IR RADIOLOGIST EVAL & MGMT  06/14/2023   RADIOLOGY WITH ANESTHESIA N/A 04/16/2023   Procedure: IR WITH ANESTHESIA;  Surgeon: Luellen Sages, MD;  [facility];  Service: Radiology;  Laterality: N/A;   TEE WITHOUT CARDIOVERSION N/A 12/08/2016   Procedure: TRANSESOPHAGEAL ECHOCARDIOGRAM (TEE);  Surgeon: Jacqueline Matsu, MD;  Location: [facility] ENDOSCOPY;  Service: Cardiovascular;  Laterality: N/A;   TUBAL LIGATION     UPPER GASTROINTESTINAL ENDOSCOPY     WISDOM TOOTH EXTRACTION      Short Social History:  Social History   Tobacco Use   Smoking status: Former    Current packs/day: 0.00    Types: Cigarettes    Quit date: 12/06/2015    Years since quitting: 7.6   Smokeless tobacco: Never  Substance Use Topics   Alcohol use: Not Currently  Allergies  Allergen Reactions   Amitriptyline  Other (See Comments)    Patient reported that it made her throat feel like its locking up and it also caused issues with her going to the bathroom   Duloxetine  Other (See Comments)    Patient reported that it made her throat lock up and it caused her to have issue with going to the bathroom   Naproxen      Vomiting, sweating, abd spasms   Other     States can't take pain meds that end in "cet" or meds that end in "ine" Darvocet/severe vomiting   Beef-Derived Drug Products     Patient has IBS prefers no beef   Pork-Derived Products     Patient has IBS prefers no pork   Darvon [Propoxyphene] Nausea And Vomiting   Hydrocodone  Nausea And Vomiting   Lactose Intolerance (Gi)     Bloating, gas, abd pain   Latex Itching and Rash   Oxycodone  Nausea And Vomiting   Percocet [Oxycodone -Acetaminophen ] Nausea And Vomiting   Topamax  [Topiramate ]     Memory made her emotional     Current Outpatient Medications  Medication Sig  Dispense Refill   amLODipine  (NORVASC ) 10 MG tablet TAKE 1 TABLET (10 MG TOTAL) BY MOUTH DAILY. 90 tablet 0   atorvastatin  (LIPITOR ) 80 MG tablet TAKE 1 TABLET (80 MG TOTAL) BY MOUTH DAILY WITH SUPPER. 90 tablet 0   BRILINTA  90 MG TABS tablet TAKE 0.5 TABLETS (45 MG TOTAL) BY MOUTH 2 (TWO) TIMES DAILY. 180 tablet 0   cholecalciferol  (CHOLECALCIFEROL ) 25 MCG tablet Take 1 tablet (1,000 Units total) by mouth daily.     docusate sodium  (COLACE) 100 MG capsule Take 1 capsule (100 mg total) by mouth 2 (two) times daily. 60 capsule 0   ELIQUIS  5 MG TABS tablet TAKE 1 TABLET (5 MG TOTAL) BY MOUTH 2 (TWO) TIMES DAILY. 180 tablet 0   ezetimibe  (ZETIA ) 10 MG tablet TAKE 1 TABLET (10 MG TOTAL) BY MOUTH DAILY. 90 tablet 0   gabapentin  (NEURONTIN ) 300 MG capsule TAKE 1 CAPSULE (300 MG TOTAL) BY MOUTH 3 (THREE) TIMES DAILY. 90 capsule 0   linaclotide  (LINZESS ) 72 MCG capsule Take 1 capsule (72 mcg total) by mouth every 3 (three) days. 30 capsule 0   lisinopril  (ZESTRIL ) 2.5 MG tablet Take 1 tablet (2.5 mg total) by mouth in the morning and at bedtime. 180 tablet 1   pantoprazole  (PROTONIX ) 40 MG tablet TAKE 1 TABLET (40 MG TOTAL) BY MOUTH DAILY FOR ACID REFLUX 90 tablet 0   potassium chloride  (KLOR-CON ) 10 MEQ tablet TAKE 2 TABLETS (20 MEQ TOTAL) BY MOUTH 2 (TWO) TIMES DAILY. 120 tablet 3   senna-docusate (SENOKOT-S) 8.6-50 MG tablet Take 2 tablets by mouth daily at 6 (six) AM. 60 tablet 1   valACYclovir  (VALTREX ) 500 MG tablet Take 1 tablet (500 mg total) by mouth 2 (two) times daily. 6 tablet 0   hydrALAZINE  (APRESOLINE ) 50 MG tablet Take 1 tablet (50 mg total) by mouth every 8 (eight) hours. 90 tablet 0   No current facility-administered medications for this visit.    Review of Systems  Constitutional:  Constitutional negative. HENT: HENT negative.  Eyes: Eyes negative.  Respiratory: Respiratory negative.  Cardiovascular: Positive for claudication.  GI: Gastrointestinal negative.  Musculoskeletal:  Positive for gait problem.  Skin: Skin negative.  Neurological: Positive for focal weakness.  Hematologic: Hematologic/lymphatic negative.  Psychiatric: Psychiatric negative.        Objective:  Objective  Vitals:  07/25/23 1351  BP: (!) 149/79  Pulse: 71  Temp: 97.8 F (36.6 C)  SpO2: 94%      Physical Exam HENT:     Head: Normocephalic.     Nose: Nose normal.  Eyes:     Pupils: Pupils are equal, round, and reactive to light.  Cardiovascular:     Pulses:          Femoral pulses are 0 on the right side and 0 on the left side.      Popliteal pulses are 0 on the right side and 0 on the left side.  Pulmonary:     Effort: Pulmonary effort is normal.  Abdominal:     General: Abdomen is flat.  Musculoskeletal:        General: Normal range of motion.     Right lower leg: No edema.     Left lower leg: No edema.  Skin:    General: Skin is warm.     Capillary Refill: Capillary refill takes less than 2 seconds.  Neurological:     General: No focal deficit present.     Mental Status: She is alert.  Psychiatric:        Mood and Affect: Mood normal.        Thought Content: Thought content normal.        Judgment: Judgment normal.     Data: ABI Findings:  +---------+------------------+-----+--------+  Right   Rt Pressure (mmHg)IndexWaveform  +---------+------------------+-----+--------+  Brachial 139                              +---------+------------------+-----+--------+  PTA     108               0.76 biphasic  +---------+------------------+-----+--------+  DP      89                0.63 biphasic  +---------+------------------+-----+--------+  Great Toe64                0.45 Abnormal  +---------+------------------+-----+--------+   +---------+------------------+-----+----------+  Left    Lt Pressure (mmHg)IndexWaveform    +---------+------------------+-----+----------+  Brachial 142                                 +---------+------------------+-----+----------+  PTA     66                0.46 monophasic  +---------+------------------+-----+----------+  DP      61                0.43 monophasic  +---------+------------------+-----+----------+  Great Toe36                0.25 Abnormal    +---------+------------------+-----+----------+   +-------+-----------+-----------+  ABI/TBIToday's ABIToday's TBI  +-------+-----------+-----------+  Right 0.76       0.45         +-------+-----------+-----------+  Left  0.46       0.25         +-------+-----------+-----------+     Summary:  Right: Resting right ankle-brachial index indicates moderate right lower  extremity arterial disease. The right toe-brachial index is abnormal.   Left: Resting left ankle-brachial index indicates severe left lower  extremity arterial disease. The left toe-brachial index is abnormal.       Recent CT scan reviewed with the patient and her father   Assessment/Plan:  58 year old female with history of stroke affecting her speech now walking with help of a walker with what appears to be claudication that is somewhat lifestyle limiting at this time with ABIs to support this severely depressed on the left and moderately depressed on the right.  We reviewed her previous CT scan and she does not have femoral pulses on either side.  At this time patient appears high risk for procedures and needs to get stronger and we will have discussed following up in 6 months with repeat ABIs unless she has issues prior.     Adine Hoof MD [facility]

## 2023-07-26 ENCOUNTER — Telehealth: Payer: Self-pay

## 2023-07-26 DIAGNOSIS — I63512 Cerebral infarction due to unspecified occlusion or stenosis of left middle cerebral artery: Secondary | ICD-10-CM | POA: Diagnosis not present

## 2023-07-26 NOTE — Patient Outreach (Signed)
 First telephone outreach attempt to obtain mRS. No answer. Left message for returned call.  [facility]  Population Health Care Management Assistant  Direct Dial: [phone]  Fax: [phone] Website: [email or web address]

## 2023-07-26 NOTE — Patient Outreach (Signed)
 Patient mother returned call, obtained mRS was successfully. MRS= 3  [facility] Pars[facility] Health Care Management Assistant  Direct Dial : [phone]  Fax: [phone] Website: [email or web address]

## 2023-07-27 ENCOUNTER — Telehealth: Payer: Self-pay | Admitting: Neurology

## 2023-07-27 DIAGNOSIS — I69328 Other speech and language deficits following cerebral infarction: Secondary | ICD-10-CM

## 2023-07-27 DIAGNOSIS — I639 Cerebral infarction, unspecified: Secondary | ICD-10-CM

## 2023-07-27 NOTE — Telephone Encounter (Signed)
 Called and confirmed this information because last we heard they were making an exception and were seeing her because the speech therapist had gone PRN.   She is now not seeing any patients.  New referral placed. Can we get her in with a different home health agency with Speech therapy please?   Tilman Fonder  Copied from CRM [id number]. Topic: Clinical - Home Health Verbal Orders >> Jul 27, 2023  3:48 PM Adonis Hoot wrote: Caller/Agency: [facility] Callback Number: [phone]  [facility] called in and would like to know if provider could Speech and Physical therapist  orders to another agency due to them not having a speech therapist.

## 2023-07-30 ENCOUNTER — Encounter: Payer: Self-pay | Admitting: Obstetrics and Gynecology

## 2023-07-30 ENCOUNTER — Ambulatory Visit (INDEPENDENT_AMBULATORY_CARE_PROVIDER_SITE_OTHER): Admitting: Obstetrics and Gynecology

## 2023-07-30 ENCOUNTER — Other Ambulatory Visit (HOSPITAL_COMMUNITY)
Admission: RE | Admit: 2023-07-30 | Discharge: 2023-07-30 | Disposition: A | Discharge status: Home / Self Care | Source: Ambulatory Visit | Attending: Obstetrics and Gynecology | Admitting: Obstetrics and Gynecology

## 2023-07-30 ENCOUNTER — Other Ambulatory Visit: Payer: Self-pay

## 2023-07-30 VITALS — BP 121/72 | HR 98 | Ht 61.0 in | Wt 143.0 lb

## 2023-07-30 DIAGNOSIS — N6459 Other signs and symptoms in breast: Secondary | ICD-10-CM

## 2023-07-30 DIAGNOSIS — Z124 Encounter for screening for malignant neoplasm of cervix: Secondary | ICD-10-CM | POA: Diagnosis not present

## 2023-07-30 DIAGNOSIS — Z01419 Encounter for gynecological examination (general) (routine) without abnormal findings: Secondary | ICD-10-CM | POA: Diagnosis not present

## 2023-07-30 DIAGNOSIS — Z1211 Encounter for screening for malignant neoplasm of colon: Secondary | ICD-10-CM

## 2023-07-30 DIAGNOSIS — R002 Palpitations: Secondary | ICD-10-CM

## 2023-07-30 DIAGNOSIS — I63512 Cerebral infarction due to unspecified occlusion or stenosis of left middle cerebral artery: Secondary | ICD-10-CM | POA: Diagnosis not present

## 2023-07-30 DIAGNOSIS — I70213 Atherosclerosis of native arteries of extremities with intermittent claudication, bilateral legs: Secondary | ICD-10-CM

## 2023-07-30 NOTE — Progress Notes (Signed)
 ANNUAL GYNECOLOGY VISIT Chief Complaint  Patient presents with   Gynecologic Exam     Subjective:  Brittany [name] is a 58 y.o. [phone] who presents for annual exam.  Patient is a poor historian and presents with her mother who helps provide the history. Patient has had a stroke Her mother reports newly inverted nipples as far as she knows. She doesn't think the patient has had inverted nipples before. Denies nipple discharge or skin changes. Had had occasional vaginal/groin pain from procedure she had femoral access for her stroke. Denies vaginal bleeding  Gyn History: Last pap: No results found for: "DIAGPAP", "HPV", "HPVHIGH" History of abnormal pap: No Last mammogram: due Last colonoscopy: due        06/07/2023    1:05 PM 01/06/2021    1:46 PM 10/27/2019    3:23 PM 08/04/2019    1:37 PM 02/04/2019    1:38 PM  Depression screen PHQ 2/9  Decreased Interest 0 0 0 0 0  Down, Depressed, Hopeless 0 0 0 0 0  PHQ - 2 Score 0 0 0 0 0  Altered sleeping 0      Tired, decreased energy 0      Change in appetite 0      Feeling bad or failure about yourself  0      Trouble concentrating 0      Moving slowly or fidgety/restless 0      Suicidal thoughts 0      PHQ-9 Score 0             No data to display            OB History     Gravida  3   Para  2   Term  2   Preterm      AB  1   Living  2      SAB  1   IAB      Ectopic      Multiple      Live Births  2           Past Medical History:  Diagnosis Date   Anemia    Arthritis    trigger finger in left hand   Complication of anesthesia    per pt, hard to wake up!   Elevated cholesterol    Female bladder prolapse    per urologist, does not have prolaspe   GERD (gastroesophageal reflux disease)    Heart murmur    pt unsure.   Hiatal hernia    Hypertension    IBS (irritable bowel syndrome)    Multiple myeloma (HCC) 2005   had partial chemo   Peripheral neuropathy    SOB  (shortness of breath) on exertion    uses an inhaler   Stroke (HCC) 2017   paralysis left arm/uses a walker    Past Surgical History:  Procedure Laterality Date   BONE BIOPSY  2005   in her back   COLONOSCOPY     over 10 years x3   ESOPHAGOGASTRODUODENOSCOPY     incomplete-over 10 years ago    IR CT HEAD LTD  04/16/2023   IR CT HEAD LTD  04/16/2023   IR INTRA CRAN STENT  04/16/2023   IR PERCUTANEOUS ART THROMBECTOMY/INFUSION INTRACRANIAL INC DIAG ANGIO  04/16/2023   IR RADIOLOGIST EVAL & MGMT  06/14/2023   RADIOLOGY WITH ANESTHESIA N/A 04/16/2023   Procedure: IR WITH ANESTHESIA;  Surgeon: Luellen Sages, MD;  Location: [facility] OR;  Service: Radiology;  Laterality: N/A;   TEE WITHOUT CARDIOVERSION N/A 12/08/2016   Procedure: TRANSESOPHAGEAL ECHOCARDIOGRAM (TEE);  Surgeon: Jacqueline Matsu, MD;  Location: [facility] ENDOSCOPY;  Service: Cardiovascular;  Laterality: N/A;   TUBAL LIGATION     UPPER GASTROINTESTINAL ENDOSCOPY     WISDOM TOOTH EXTRACTION      Social History   Socioeconomic History   Marital status: Legally Separated    Spouse name: Not on file   Number of children: Not on file   Years of education: Not on file   Highest education level: Not on file  Occupational History   Occupation: Disabled  Tobacco Use   Smoking status: Former    Current packs/day: 0.00    Types: Cigarettes    Quit date: 12/06/2015    Years since quitting: 7.6   Smokeless tobacco: Never  Vaping Use   Vaping status: Never Used  Substance and Sexual Activity   Alcohol use: Not Currently   Drug use: Not Currently    Types: Marijuana    Comment: in the past    Sexual activity: Not Currently  Other Topics Concern   Not on file  Social History Narrative   Not on file   Social Drivers of Health   Financial Resource Strain: Not on file  Food Insecurity: Food Insecurity Present (04/12/2023)   Hunger Vital Sign    Worried About Running Out of Food in the Last Year: Sometimes true    Ran Out of Food in  the Last Year: Never true  Transportation Needs: Unmet Transportation Needs (04/12/2023)   PRAPARE - Administrator, Civil Service (Medical): Yes    Lack of Transportation (Non-Medical): Yes  Physical Activity: Not on file  Stress: Not on file  Social Connections: Not on file    Family History  Problem Relation Age of Onset   Hypertension Mother    Sarcoidosis Mother        currently in remission    Diverticulitis Mother    Irritable bowel syndrome Mother    Liver cancer Mother    Hypertension Father    Stomach cancer Father    Congestive Heart Failure Father    Stroke Maternal Uncle    Scoliosis Brother    Colon cancer Neg Hx    Esophageal cancer Neg Hx    Colon polyps Neg Hx    Rectal cancer Neg Hx     Current Outpatient Medications on File Prior to Visit  Medication Sig Dispense Refill   amLODipine  (NORVASC ) 10 MG tablet TAKE 1 TABLET (10 MG TOTAL) BY MOUTH DAILY. 90 tablet 0   atorvastatin  (LIPITOR ) 80 MG tablet TAKE 1 TABLET (80 MG TOTAL) BY MOUTH DAILY WITH SUPPER. 90 tablet 0   BRILINTA  90 MG TABS tablet TAKE 0.5 TABLETS (45 MG TOTAL) BY MOUTH 2 (TWO) TIMES DAILY. 180 tablet 0   cholecalciferol  (CHOLECALCIFEROL ) 25 MCG tablet Take 1 tablet (1,000 Units total) by mouth daily.     docusate sodium  (COLACE) 100 MG capsule Take 1 capsule (100 mg total) by mouth 2 (two) times daily. 60 capsule 0   ELIQUIS  5 MG TABS tablet TAKE 1 TABLET (5 MG TOTAL) BY MOUTH 2 (TWO) TIMES DAILY. 180 tablet 0   ezetimibe  (ZETIA ) 10 MG tablet TAKE 1 TABLET (10 MG TOTAL) BY MOUTH DAILY. 90 tablet 0   linaclotide  (LINZESS ) 72 MCG capsule Take 1 capsule (72 mcg total) by mouth every 3 (three) days. 30 capsule 0  lisinopril  (ZESTRIL ) 2.5 MG tablet Take 1 tablet (2.5 mg total) by mouth in the morning and at bedtime. 180 tablet 1   potassium chloride  (KLOR-CON ) 10 MEQ tablet TAKE 2 TABLETS (20 MEQ TOTAL) BY MOUTH 2 (TWO) TIMES DAILY. 120 tablet 3   senna-docusate (SENOKOT-S) 8.6-50 MG  tablet Take 2 tablets by mouth daily at 6 (six) AM. 60 tablet 1   gabapentin  (NEURONTIN ) 300 MG capsule TAKE 1 CAPSULE (300 MG TOTAL) BY MOUTH 3 (THREE) TIMES DAILY. (Patient not taking: Reported on 07/30/2023) 90 capsule 0   hydrALAZINE  (APRESOLINE ) 50 MG tablet Take 1 tablet (50 mg total) by mouth every 8 (eight) hours. 90 tablet 0   pantoprazole  (PROTONIX ) 40 MG tablet TAKE 1 TABLET (40 MG TOTAL) BY MOUTH DAILY FOR ACID REFLUX (Patient not taking: Reported on 07/30/2023) 90 tablet 0   valACYclovir  (VALTREX ) 500 MG tablet Take 1 tablet (500 mg total) by mouth 2 (two) times daily. (Patient not taking: Reported on 07/30/2023) 6 tablet 0   No current facility-administered medications on file prior to visit.    Allergies  Allergen Reactions   Amitriptyline  Other (See Comments)    Patient reported that it made her throat feel like its locking up and it also caused issues with her going to the bathroom   Duloxetine  Other (See Comments)    Patient reported that it made her throat lock up and it caused her to have issue with going to the bathroom   Naproxen      Vomiting, sweating, abd spasms   Other     States can't take pain meds that end in "cet" or meds that end in "ine" Darvocet/severe vomiting   Beef-Derived Drug Products     Patient has IBS prefers no beef   Pork-Derived Products     Patient has IBS prefers no pork   Darvon [Propoxyphene] Nausea And Vomiting   Hydrocodone  Nausea And Vomiting   Lactose Intolerance (Gi)     Bloating, gas, abd pain   Latex Itching and Rash   Oxycodone  Nausea And Vomiting   Percocet [Oxycodone -Acetaminophen ] Nausea And Vomiting   Topamax  [Topiramate ]     Memory made her emotional      Objective:   Vitals:   07/30/23 1435  BP: 121/72  Pulse: 98  Weight: 143 lb 0.6 oz (64.9 kg)  Height: 5\' 1"  (1.549 m)   Physical Examination:   General appearance - well appearing, and in no distress  Mental status - alert, oriented to person, place, and  time  Psych:  normal mood and affect  Skin - warm and dry, normal color, no suspicious lesions noted  Breasts - breasts appear normal, no suspicious masses, no skin changes or  axillary nodes, +nipple inversion bilaterally  Abdomen - soft, nontender, nondistended, no masses or organomegaly  Pelvic -  VULVA: normal appearing vulva with no masses, tenderness or lesions   VAGINA: normal appearing vagina with normal color and discharge, no lesions   CERVIX: normal appearing cervix without discharge or lesions, no CMT  Thin prep pap is done with HR HPV cotesting  UTERUS: uterus is felt to be normal size, shape, consistency and nontender   ADNEXA: No adnexal masses or tenderness noted.  Extremities:  No swelling or varicosities noted  Chaperone present for exam  Assessment and Plan:  1. Well woman exam with routine gynecological exam (Primary) Pap/HPV Mammo ordered Colonoscopy referral Normal pelvic exam  2. Cervical cancer screening - Cytology - PAP( [place])  3.  Colon cancer screening - Ambulatory referral to Gastroenterology  4. Inverted nipple Will do diagnostic mammo - MM 3D DIAGNOSTIC MAMMOGRAM BILATERAL BREAST; Future   No follow-ups on file.  Future Appointments  Date Time Provider Department Center  08/01/2023 11:00 AM Lisabeth Rider, MD [facility] None  08/28/2023  2:20 PM Manfred Seed, MD [facility]PT None  10/03/2023  2:00 PM Everlina Hock, NP [facility] PEC  12/07/2023  2:00 PM Lylia Sand, MD CPR-PRMA CPR  01/23/2024  3:00 PM [facility] 2 H[facility]-ULTRA H&V  01/23/2024  4:00 PM Adine Hoof, MD [facility]HV[facility]S H&V    Marci Setter, MD, FACOG Obstetrician & Gynecologist, [facility] for [facility], [facility]

## 2023-07-30 NOTE — Telephone Encounter (Signed)
 Thank you. Hopefully someone can get her in.

## 2023-07-30 NOTE — Addendum Note (Signed)
 Addended by: Dot Gazella on: 07/30/2023 03:39 PM   Modules accepted: Orders

## 2023-08-01 ENCOUNTER — Encounter: Payer: Self-pay | Admitting: Neurology

## 2023-08-01 ENCOUNTER — Ambulatory Visit: Admitting: Neurology

## 2023-08-01 VITALS — BP 148/88 | HR 99 | Ht 64.0 in | Wt 143.0 lb

## 2023-08-01 DIAGNOSIS — I63512 Cerebral infarction due to unspecified occlusion or stenosis of left middle cerebral artery: Secondary | ICD-10-CM

## 2023-08-01 DIAGNOSIS — I82411 Acute embolism and thrombosis of right femoral vein: Secondary | ICD-10-CM

## 2023-08-01 DIAGNOSIS — I69320 Aphasia following cerebral infarction: Secondary | ICD-10-CM | POA: Diagnosis not present

## 2023-08-01 NOTE — Patient Instructions (Signed)
 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 I also advised the patient to eat a healthy diet with plenty of whole grains, cereals, fruits and vegetables, exercise regularly and maintain ideal body weight.  Check repeat lower extremity venous Doppler in 3 months and active will discontinue Eliquis  at that time.  Followup in the future with my nurse practitioner in 6 months or call earlier if necessary.  Stroke Prevention Some medical conditions and behaviors can lead to a higher chance of having a stroke. You can help prevent a stroke by eating healthy, exercising, not smoking, and managing any medical conditions you have. Stroke is a leading cause of functional impairment. Primary prevention is particularly important because a majority of strokes are first-time events. Stroke changes the lives of not only those who experience a stroke but also their family and other caregivers. How can this condition affect me? A stroke is a medical emergency and should be treated right away. A stroke can lead to brain damage and can sometimes be life-threatening. If a person gets medical treatment right away, there is a better chance of surviving and recovering from a stroke. What can increase my risk? The following medical conditions may increase your risk of a stroke: Cardiovascular disease. High blood pressure (hypertension). Diabetes. High  cholesterol. Sickle cell disease. Blood clotting disorders (hypercoagulable state). Obesity. Sleep disorders (obstructive sleep apnea). Other risk factors include: Being older than age 63. Having a history of blood clots, stroke, or mini-stroke (transient ischemic attack, TIA). Genetic factors, such as race, ethnicity, or a family history of stroke. Smoking cigarettes or using other tobacco products. Taking birth control pills, especially if you also use tobacco. Heavy use of alcohol or drugs, especially cocaine and methamphetamine. Physical inactivity. What actions can I take to prevent this? Manage your health conditions High cholesterol levels. Eating a healthy diet is important for preventing high cholesterol. If cholesterol cannot be managed through diet alone, you may need to take medicines. Take any prescribed medicines to control your cholesterol as told by your health care provider. Hypertension. To reduce your risk of stroke, try to keep your blood pressure below 130/80. Eating a healthy diet and exercising regularly are important for controlling blood pressure. If these steps are not enough to manage your blood pressure, you may need to take medicines. Take any prescribed medicines to control hypertension as told by your health care provider. Ask your health care provider if you should monitor your blood pressure at home. Have your blood pressure checked every year, even if your blood pressure is normal. Blood pressure increases with age and some medical conditions. Diabetes. Eating a healthy diet and exercising regularly are important parts of managing your blood sugar (glucose). If your blood sugar cannot be managed through diet and exercise, you may need to take medicines. Take any prescribed medicines to control your diabetes as told by your health care provider. Get evaluated for obstructive sleep apnea. Talk to your health care provider about getting a sleep evaluation if  you snore a lot or have excessive sleepiness. Make sure that any other medical conditions you have, such as atrial fibrillation or atherosclerosis, are managed. Nutrition Follow instructions from your health care provider about what to eat or drink to help manage your health condition. These instructions may include: Reducing your daily calorie intake. Limiting how much salt (sodium) you use to 1,500 milligrams (mg) each day. Using only healthy fats for cooking, such as olive oil, canola oil, or sunflower oil. Eating healthy foods. You can do this by: Choosing foods that are high in fiber, such as whole grains, and fresh fruits and vegetables. Eating at least 5 servings of fruits and vegetables a day. Try to fill one-half of your plate with fruits and vegetables at each meal. Choosing lean protein foods, such as lean cuts of meat, poultry without skin, fish, tofu, beans, and nuts. Eating low-fat dairy products. Avoiding foods that are high in sodium. This can help lower blood pressure. Avoiding foods that have saturated fat, trans fat, and cholesterol. This can help prevent high cholesterol. Avoiding processed and prepared foods. Counting your daily carbohydrate intake.  Lifestyle If you drink alcohol: Limit how much you have to: 0-1 drink a day for women who are not pregnant. 0-2 drinks a day for men. Know how much alcohol is in your drink. In the [place] one drink equals one 12 oz bottle of beer ( ), one 5 oz glass of wine ( ), or one 1 oz glass of hard liquor (44mL). Do not use any products that contain nicotine or tobacco. These products include cigarettes, chewing tobacco, and vaping devices, such as e-cigarettes. If you need help quitting, ask your health care provider. Avoid secondhand smoke. Do not use drugs. Activity  Try to stay at a healthy weight. Get at least 30 minutes of exercise on most days, such as: Fast walking. Biking. Swimming. Medicines Take  over-the-counter and prescription medicines only as told by your health care provider. Aspirin  or blood thinners (antiplatelets or anticoagulants) may be recommended to reduce your risk of forming blood clots that can lead to stroke. Avoid taking birth control pills. Talk to your health care provider about the risks of taking birth control pills if: You are over 67 years old. You smoke. You get very bad headaches. You have had a blood clot. Where to find more information American Stroke Association: www.strokeassociation.org Get help right away if: You or a loved one has any symptoms of a stroke. "BE FAST" is an easy way to remember the main warning signs of a stroke: B - Balance. Signs are dizziness, sudden trouble walking, or loss of balance. E - Eyes. Signs are trouble seeing or a sudden change in vision. F - Face. Signs are sudden weakness or numbness of the face, or the face or eyelid drooping on one side. A - Arms. Signs are weakness or numbness in an arm. This happens suddenly and usually on one side of the body. S - Speech. Signs are sudden trouble speaking, slurred speech, or trouble understanding what people say. T - Time. Time to call emergency services. Write down what time symptoms started. You or a loved one has other signs of a stroke, such as: A sudden, severe headache with no known cause. Nausea or vomiting. Seizure. These symptoms may represent a serious problem that is an emergency. Do not wait to see if the symptoms will go away. Get medical help right away. Call your local emergency services (911 in the [place] Do not  drive yourself to the hospital. Summary You can help to prevent a stroke by eating healthy, exercising, not smoking, limiting alcohol intake, and managing any medical conditions you may have. Do not use any products that contain nicotine or tobacco. These include cigarettes, chewing tobacco, and vaping devices, such as e-cigarettes. If you need help quitting,  ask your health care provider. Remember "BE FAST" for warning signs of a stroke. Get help right away if you or a loved one has any of these signs. This information is not intended to replace advice given to you by your health care provider. Make sure you discuss any questions you have with your health care provider. Document Revised: 02/06/2022 Document Reviewed: 02/06/2022 Elsevier Patient Education  2024 [employer].

## 2023-08-01 NOTE — Progress Notes (Signed)
 [facility] [address] [place]. [place] [address] [phone]       OFFICE FOLLOW-UP NOTE  [name] Date of Birth:  07-18-1965 Medical Record Number:  [id number]   HPI: [name] is a 58 year old African-American lady seen today for initial office follow-up visit following hospital admission for stroke.  She is accompanied by her father.  History is obtained from them and review of electronic medical records and I personally reviewed pertinent available imaging films in PACS.  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  She was found to have left MCA territory stroke with severe left MCA stenosis.  She had previously had a left cerebellar stroke with minimal residual deficits.  On 04/16/2023 the patient developed significant neurological worsening with acute onset right gaze deviation and an inability to follow commands and global aphasia.  CT angiogram was obtained emergently which showed proximal left M1 occlusion.  She was taken for emergent mechanical thrombectomy after discussion of risk benefits and alternatives with her son.  She underwent complete revascularization of the left MCA but it required placement of rescue left MCA stent.  She subsequently developed acute DVT in the right leg was transition from aspirin  Brilinta  to Brilinta  and initially heparin  and then Eliquis .  2D echo showed ejection fraction of 60 to 65% with moderate dilated left atrium.  LDL cholesterol 124 mg percent.  Hemoglobin A1c was 5.9.  Urine drug screen was positive for marijuana.  Patient has finished rehab and is presently living at home with her father.  She is still has significant aphasia and is able to speak only a few words  and occasional short sentences.  She can understand simple midline and one-step commands.  She is able to ambulate independently though she has mild weakness of the right hand right foot.  She uses a walker for ambulation.  She has had no falls or injuries.  She remains on Eliquis  and Brilinta  which she is tolerating well with increased bruising but no bleeding episodes.  She currently requires 24-hour supervision at home but she can get up and go to the restroom by herself and even change her clothes and make herself a meal.  ROS:   14 system review of systems is positive for aphasia, speech difficulties, difficulty understanding, weakness, bruising all other systems negative  PMH:  Past Medical History:  Diagnosis Date   Anemia    Arthritis    trigger finger in left hand   Complication of anesthesia    per pt, hard to wake up!   Elevated cholesterol    Female bladder prolapse    per urologist, does not have prolaspe   GERD (gastroesophageal reflux disease)    Heart murmur    pt unsure.   Hiatal hernia    Hypertension    IBS (irritable bowel syndrome)    Multiple myeloma (HCC) 2005   had partial chemo   Peripheral neuropathy    SOB (shortness of breath) on exertion    uses an inhaler   Stroke [facility]) 2017   paralysis left arm/uses a walker    Social History:  Social History   Socioeconomic History   Marital status: Legally Separated    Spouse name: Not on file   Number of children: Not on file  Years of education: Not on file   Highest education level: Not on file  Occupational History   Occupation: Disabled  Tobacco Use   Smoking status: Former    Current packs/day: 0.00    Types: Cigarettes    Quit date: 12/06/2015    Years since quitting: 7.6   Smokeless tobacco: Never  Vaping Use   Vaping status: Never Used  Substance and Sexual Activity   Alcohol use: Not Currently   Drug use: Not Currently    Types: Marijuana    Comment: in the past    Sexual activity: Not  Currently  Other Topics Concern   Not on file  Social History Narrative   Not on file   Social Drivers of Health   Financial Resource Strain: Not on file  Food Insecurity: Food Insecurity Present (04/12/2023)   Hunger Vital Sign    Worried About Running Out of Food in the Last Year: Sometimes true    Ran Out of Food in the Last Year: Never true  Transportation Needs: Unmet Transportation Needs (04/12/2023)   PRAPARE - Administrator, Civil Service (Medical): Yes    Lack of Transportation (Non-Medical): Yes  Physical Activity: Not on file  Stress: Not on file  Social Connections: Not on file  Intimate Partner Violence: Not At Risk (04/12/2023)   Humiliation, Afraid, Rape, and Kick questionnaire    Fear of Current or Ex-Partner: No    Emotionally Abused: No    Physically Abused: No    Sexually Abused: No    Medications:   Current Outpatient Medications on File Prior to Visit  Medication Sig Dispense Refill   amLODipine  (NORVASC ) 10 MG tablet TAKE 1 TABLET (10 MG TOTAL) BY MOUTH DAILY. 90 tablet 0   atorvastatin  (LIPITOR ) 80 MG tablet TAKE 1 TABLET (80 MG TOTAL) BY MOUTH DAILY WITH SUPPER. 90 tablet 0   BRILINTA  90 MG TABS tablet TAKE 0.5 TABLETS (45 MG TOTAL) BY MOUTH 2 (TWO) TIMES DAILY. 180 tablet 0   cholecalciferol  (CHOLECALCIFEROL ) 25 MCG tablet Take 1 tablet (1,000 Units total) by mouth daily.     docusate sodium  (COLACE) 100 MG capsule Take 1 capsule (100 mg total) by mouth 2 (two) times daily. 60 capsule 0   ELIQUIS  5 MG TABS tablet TAKE 1 TABLET (5 MG TOTAL) BY MOUTH 2 (TWO) TIMES DAILY. 180 tablet 0   ezetimibe  (ZETIA ) 10 MG tablet TAKE 1 TABLET (10 MG TOTAL) BY MOUTH DAILY. 90 tablet 0   gabapentin  (NEURONTIN ) 300 MG capsule TAKE 1 CAPSULE (300 MG TOTAL) BY MOUTH 3 (THREE) TIMES DAILY. 90 capsule 0   linaclotide  (LINZESS ) 72 MCG capsule Take 1 capsule (72 mcg total) by mouth every 3 (three) days. 30 capsule 0   lisinopril  (ZESTRIL ) 2.5 MG tablet Take 1 tablet  (2.5 mg total) by mouth in the morning and at bedtime. 180 tablet 1   pantoprazole  (PROTONIX ) 40 MG tablet TAKE 1 TABLET (40 MG TOTAL) BY MOUTH DAILY FOR ACID REFLUX 90 tablet 0   potassium chloride  (KLOR-CON ) 10 MEQ tablet TAKE 2 TABLETS (20 MEQ TOTAL) BY MOUTH 2 (TWO) TIMES DAILY. 120 tablet 3   senna-docusate (SENOKOT-S) 8.6-50 MG tablet Take 2 tablets by mouth daily at 6 (six) AM. 60 tablet 1   valACYclovir  (VALTREX ) 500 MG tablet Take 1 tablet (500 mg total) by mouth 2 (two) times daily. 6 tablet 0   hydrALAZINE  (APRESOLINE ) 50 MG tablet Take 1 tablet (50 mg total) by mouth every 8 (  eight) hours. 90 tablet 0   No current facility-administered medications on file prior to visit.    Allergies:   Allergies  Allergen Reactions   Amitriptyline  Other (See Comments)    Patient reported that it made her throat feel like its locking up and it also caused issues with her going to the bathroom   Duloxetine  Other (See Comments)    Patient reported that it made her throat lock up and it caused her to have issue with going to the bathroom   Naproxen      Vomiting, sweating, abd spasms   Other     States can't take pain meds that end in "cet" or meds that end in "ine" Darvocet/severe vomiting   Beef-Derived Drug Products     Patient has IBS prefers no beef   Pork-Derived Products     Patient has IBS prefers no pork   Darvon [Propoxyphene] Nausea And Vomiting   Hydrocodone  Nausea And Vomiting   Lactose Intolerance (Gi)     Bloating, gas, abd pain   Latex Itching and Rash   Oxycodone  Nausea And Vomiting   Percocet [Oxycodone -Acetaminophen ] Nausea And Vomiting   Topamax  [Topiramate ]     Memory made her emotional     Physical Exam General: well developed, well nourished pleasant middle-aged African-American lady, seated, in no evident distress Head: head normocephalic and atraumatic.  Neck: supple with no carotid or supraclavicular bruits Cardiovascular: regular rate and rhythm, no  murmurs Musculoskeletal: no deformity Skin:  no rash/petichiae Vascular:  Normal pulses all extremities Vitals:   08/01/23 1046  BP: (!) 148/88  Pulse: 99   Neurologic Exam Mental Status: Awake and fully alert.  Globally aphasic expressive greater than receptive.  Difficulty with naming and repetition.  Very limited spontaneous speech and comprehension also impaired.  Mood and affect appropriate.  Cranial Nerves: Fundoscopic exam reveals sharp disc margins. Pupils equal, briskly reactive to light. Extraocular movements full without nystagmus. Visual fields full to confrontation. Hearing intact. Facial sensation intact. Face, tongue, palate moves normally and symmetrically.  Motor: Normal bulk and tone. Normal strength in all tested extremity muscles mild weakness of right grip and intrinsic hand muscles.  Orbits left or right upper extremity.  Mild weakness of right ankle dorsiflexors.. Sensory.: intact to touch ,pinprick .position and vibratory sensation.  Coordination: Rapid alternating movements normal in all extremities. Finger-to-nose and heel-to-shin performed accurately bilaterally. Gait and Station: Arises from chair without difficulty. Stance is normal. Gait demonstrates slight dragging of the right leg with mild right foot drop  reflexes: 1+ and symmetric. Toes downgoing.   NIHSS  6 Modified Rankin  3   ASSESSMENT: 58 year old African-American lady with left MCA infarct due to left MCA occlusion treated with successful mechanical thrombectomy requiring rescue MCA stenting but having significant residual aphasia and mild right hemiparesis.  Vascular risk factors of hyperlipidemia, intracranial stenosis and marijuana use.     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 I also advised the patient to eat  a healthy diet with plenty of whole grains, cereals, fruits and vegetables, exercise regularly and maintain ideal body weight.  Check repeat lower extremity venous Doppler in 3 months and active will discontinue Eliquis  at that time.  Followup in the future with my nurse practitioner in 6 months or call earlier if necessary. Greater than 50% of time during this  40 minute visit was spent on counseling,explanation of diagnosis stroke, intracranial stenting, deep vein thrombosis, planning of further management, discussion with patient and family and coordination of care Ardella Beaver, MD Note: This document was prepared with digital dictation and possible smart phrase technology. Any transcriptional errors that result from this process are unintentional

## 2023-08-02 ENCOUNTER — Ambulatory Visit: Payer: Self-pay | Admitting: Obstetrics and Gynecology

## 2023-08-02 DIAGNOSIS — I63512 Cerebral infarction due to unspecified occlusion or stenosis of left middle cerebral artery: Secondary | ICD-10-CM | POA: Diagnosis not present

## 2023-08-02 LAB — CYTOLOGY - PAP
Comment: NEGATIVE
Diagnosis: NEGATIVE
High risk HPV: NEGATIVE

## 2023-08-06 DIAGNOSIS — I63512 Cerebral infarction due to unspecified occlusion or stenosis of left middle cerebral artery: Secondary | ICD-10-CM | POA: Diagnosis not present

## 2023-08-07 DIAGNOSIS — I63512 Cerebral infarction due to unspecified occlusion or stenosis of left middle cerebral artery: Secondary | ICD-10-CM | POA: Diagnosis not present

## 2023-08-08 ENCOUNTER — Ambulatory Visit: Payer: Self-pay | Admitting: Cardiology

## 2023-08-08 DIAGNOSIS — I63512 Cerebral infarction due to unspecified occlusion or stenosis of left middle cerebral artery: Secondary | ICD-10-CM | POA: Diagnosis not present

## 2023-08-08 DIAGNOSIS — I639 Cerebral infarction, unspecified: Secondary | ICD-10-CM | POA: Diagnosis not present

## 2023-08-08 DIAGNOSIS — R3981 Functional urinary incontinence: Secondary | ICD-10-CM | POA: Diagnosis not present

## 2023-08-08 DIAGNOSIS — I10 Essential (primary) hypertension: Secondary | ICD-10-CM | POA: Diagnosis not present

## 2023-08-09 ENCOUNTER — Telehealth: Payer: Self-pay

## 2023-08-09 DIAGNOSIS — I63512 Cerebral infarction due to unspecified occlusion or stenosis of left middle cerebral artery: Secondary | ICD-10-CM | POA: Diagnosis not present

## 2023-08-09 NOTE — Telephone Encounter (Signed)
 LVM per DPR- per Dr. Vanetta Shawl note regarding normal Echo results. Encouraged to call with any questions. Routed to PCP.

## 2023-08-10 DIAGNOSIS — I63512 Cerebral infarction due to unspecified occlusion or stenosis of left middle cerebral artery: Secondary | ICD-10-CM | POA: Diagnosis not present

## 2023-08-13 DIAGNOSIS — I63512 Cerebral infarction due to unspecified occlusion or stenosis of left middle cerebral artery: Secondary | ICD-10-CM | POA: Diagnosis not present

## 2023-08-14 DIAGNOSIS — I63512 Cerebral infarction due to unspecified occlusion or stenosis of left middle cerebral artery: Secondary | ICD-10-CM | POA: Diagnosis not present

## 2023-08-14 NOTE — Telephone Encounter (Signed)
 They do prefer home health as she can't drive and they can't really transport her as much as she needs to appts. Thanks.

## 2023-08-15 ENCOUNTER — Telehealth: Payer: Self-pay

## 2023-08-15 DIAGNOSIS — G8929 Other chronic pain: Secondary | ICD-10-CM

## 2023-08-15 DIAGNOSIS — I63512 Cerebral infarction due to unspecified occlusion or stenosis of left middle cerebral artery: Secondary | ICD-10-CM | POA: Diagnosis not present

## 2023-08-15 NOTE — Telephone Encounter (Signed)
 If the gabapentin  isn't helpful or is causing excessive drowsiness, it is fine to stop.  Should stay on the Protonix  while at increased risk for GI bleed due to her blood thinners.

## 2023-08-15 NOTE — Telephone Encounter (Signed)
 Copied from CRM [id number]. Topic: Clinical - Medication Question >> Aug 15, 2023 11:31 AM Albertha Alosa wrote: Reason for CRM: Patient Mom , Murlean Armour called in regarding prescriptions ,   gabapentin  (NEURONTIN ) 300 MG capsule pantoprazole  (PROTONIX ) 40 MG tablet  Stated she thought NP Minna Amass took her off of these but they are still being sent would like for Carolynne Citron or nurse to give her a callback to clarify   [phone]

## 2023-08-15 NOTE — Telephone Encounter (Signed)
 I don't see that these have been stopped? Please advise.

## 2023-08-15 NOTE — Telephone Encounter (Signed)
 LVM letting patient's mother know information from Taylor's note. To call back and let us  know if she will continue Gabapentin  or not.

## 2023-08-16 DIAGNOSIS — I63512 Cerebral infarction due to unspecified occlusion or stenosis of left middle cerebral artery: Secondary | ICD-10-CM | POA: Diagnosis not present

## 2023-08-17 DIAGNOSIS — I63512 Cerebral infarction due to unspecified occlusion or stenosis of left middle cerebral artery: Secondary | ICD-10-CM | POA: Diagnosis not present

## 2023-08-18 ENCOUNTER — Other Ambulatory Visit: Payer: Self-pay | Admitting: Family Medicine

## 2023-08-20 DIAGNOSIS — I63512 Cerebral infarction due to unspecified occlusion or stenosis of left middle cerebral artery: Secondary | ICD-10-CM | POA: Diagnosis not present

## 2023-08-20 NOTE — Telephone Encounter (Signed)
 Linzess  RX already sent this AM.    Copied from CRM [id number]. Topic: Clinical - Medication Question >> Aug 20, 2023  9:14 AM [name] wrote: Reason for CRM: Brittany [name] patients mother is calling in for a refill LINZESS  72 MCG capsule and is wanting to know if she can get her refills to be moved from 1 month refill to either 2-3 refills for all of her medications. Please contact her at [phone].

## 2023-08-21 DIAGNOSIS — I63512 Cerebral infarction due to unspecified occlusion or stenosis of left middle cerebral artery: Secondary | ICD-10-CM | POA: Diagnosis not present

## 2023-08-22 DIAGNOSIS — I63512 Cerebral infarction due to unspecified occlusion or stenosis of left middle cerebral artery: Secondary | ICD-10-CM | POA: Diagnosis not present

## 2023-08-23 ENCOUNTER — Telehealth: Payer: Self-pay

## 2023-08-23 DIAGNOSIS — K5900 Constipation, unspecified: Secondary | ICD-10-CM

## 2023-08-23 DIAGNOSIS — I63512 Cerebral infarction due to unspecified occlusion or stenosis of left middle cerebral artery: Secondary | ICD-10-CM | POA: Diagnosis not present

## 2023-08-23 MED ORDER — LINACLOTIDE 72 MCG PO CAPS
72.0000 ug | ORAL_CAPSULE | ORAL | 0 refills | Status: AC
Start: 2023-08-23 — End: ?

## 2023-08-23 NOTE — Telephone Encounter (Signed)
 Copied from CRM [id number]. Topic: Clinical - Prescription Issue >> Aug 23, 2023  1:51 PM Kita Perish H wrote: Reason for CRM: Patients mom is calling to check if refill was requested for the linaclotide  (LINZESS ) 72 MCG capsule, request in system as of 6/2 but also shows discontinued and patient is almost out. Also, request came from [facility] and mom wants prescriptions sent to the [facility] on file.  Josephine [phone]

## 2023-08-24 DIAGNOSIS — I63512 Cerebral infarction due to unspecified occlusion or stenosis of left middle cerebral artery: Secondary | ICD-10-CM | POA: Diagnosis not present

## 2023-08-28 ENCOUNTER — Ambulatory Visit: Admitting: Cardiology

## 2023-09-10 ENCOUNTER — Other Ambulatory Visit: Payer: Self-pay | Admitting: Family Medicine

## 2023-09-10 DIAGNOSIS — K5900 Constipation, unspecified: Secondary | ICD-10-CM

## 2023-09-10 DIAGNOSIS — I10 Essential (primary) hypertension: Secondary | ICD-10-CM

## 2023-09-10 NOTE — Telephone Encounter (Signed)
 Copied from CRM [id number]. Topic: Clinical - Medication Refill >> Sep 10, 2023  9:33 AM Marissa P wrote: Medication: lisinopril  (ZESTRIL ) 2.5 MG tablet,  linaclotide  (LINZESS ) 72 MCG capsule, amLODipine  (NORVASC ) 10 MG tablet, ezetimibe  (ZETIA ) 10 MG tablet, atorvastatin  (LIPITOR ) 80 MG tablet, pantoprazole  (PROTONIX ) 40 MG tablet, ELIQUIS  5 MG TABS tablet, BRILINTA  90 MG TABS tablet and potassium chloride  (KLOR-CON ) 10 MEQ tablet  Has the patient contacted their pharmacy? No (Agent: If no, request that the patient contact the pharmacy for the refill. If patient does not wish to contact the pharmacy document the reason why and proceed with request.) (Agent: If yes, when and what did the pharmacy advise?)  This is the patient's preferred pharmacy:  [id number] - [place], [place] - [address] [place] [address] Phone: [phone] Fax: [phone]  Is this the correct pharmacy for this prescription? Yes If no, delete pharmacy and type the correct one.   Has the prescription been filled recently? No  Is the patient out of the medication? No  Has the patient been seen for an appointment in the last year OR does the patient have an upcoming appointment? Yes  Can we respond through MyChart? No  Agent: Please be advised that Rx refills may take up to 3 business days. We ask that you follow-up with your pharmacy.

## 2023-09-10 NOTE — Telephone Encounter (Signed)
 Pharmacy updated in patient's chart and all other pharmacies removed.   Copied from CRM [id number]. Topic: Clinical - Medication Question >> Sep 10, 2023  9:31 AM Marissa P wrote: Reason for CRM: Patients mother called speaking on behalf of patient she's had a stroke. All her meds moving forward need to go to [id number] - [place], [place] - [address] [place] [address] Phone: [phone] Fax: [phone] Hours: Not open 24 hours  Please.

## 2023-09-13 ENCOUNTER — Telehealth: Payer: Self-pay

## 2023-09-13 DIAGNOSIS — I10 Essential (primary) hypertension: Secondary | ICD-10-CM

## 2023-09-13 DIAGNOSIS — G8929 Other chronic pain: Secondary | ICD-10-CM

## 2023-09-13 MED ORDER — PANTOPRAZOLE SODIUM 40 MG PO TBEC: 40.0000 mg | DELAYED_RELEASE_TABLET | Freq: Every day | ORAL | 1 refills | Status: AC

## 2023-09-13 MED ORDER — APIXABAN 5 MG PO TABS: 5.0000 mg | ORAL_TABLET | Freq: Two times a day (BID) | ORAL | 1 refills | Status: AC

## 2023-09-13 MED ORDER — POTASSIUM CHLORIDE ER 10 MEQ PO TBCR: 20.0000 meq | EXTENDED_RELEASE_TABLET | Freq: Two times a day (BID) | ORAL | 1 refills | Status: AC

## 2023-09-13 MED ORDER — TICAGRELOR 90 MG PO TABS
90.0000 mg | ORAL_TABLET | Freq: Two times a day (BID) | ORAL | 1 refills | Status: AC
Start: 2023-09-13 — End: ?

## 2023-09-13 MED ORDER — EZETIMIBE 10 MG PO TABS: 10.0000 mg | ORAL_TABLET | Freq: Every day | ORAL | 1 refills | Status: AC

## 2023-09-13 MED ORDER — ATORVASTATIN CALCIUM 80 MG PO TABS: 80.0000 mg | ORAL_TABLET | Freq: Every day | ORAL | 1 refills | Status: AC

## 2023-09-13 MED ORDER — AMLODIPINE BESYLATE 10 MG PO TABS: 10.0000 mg | ORAL_TABLET | Freq: Every day | ORAL | 1 refills | Status: AC

## 2023-09-13 MED ORDER — GABAPENTIN 300 MG PO CAPS: 300.0000 mg | ORAL_CAPSULE | Freq: Three times a day (TID) | ORAL | 1 refills | Status: AC

## 2023-09-13 MED ORDER — LISINOPRIL 2.5 MG PO TABS: 2.5000 mg | ORAL_TABLET | Freq: Two times a day (BID) | ORAL | 1 refills | Status: AC

## 2023-09-13 NOTE — Telephone Encounter (Signed)
 LVM with patient's mom. I let her know Pharmacy was changed in the system when we got the previous message, but they did not ask for refills of anything at that time. Let her know when transferring pharmacies, they usually contact each other to transfer prescriptions. Asked for them to call back if they need something specific refilled and we would send it.

## 2023-09-13 NOTE — Telephone Encounter (Signed)
 Copied from CRM [id number]. Topic: Clinical - Medication Question >> Sep 12, 2023 11:51 AM Charolett L wrote: Reason for CRM: Patients mother called in to verify if the patients medicine was transferred to the [facility] pharmacy [address] and wanted an update and or call back as to when the pharmacy would receive them  Please CRM routed to [facility] in error

## 2023-09-13 NOTE — Telephone Encounter (Signed)
 Copied from CRM [id number]. Topic: Clinical - Medication Question >> Sep 12, 2023 11:51 AM Charolett L wrote: Reason for CRM: Patients mother called in to verify if the patients medicine was transferred to the [facility] pharmacy [address] and wanted an update and or call back as to when the pharmacy would receive them >> Sep 13, 2023  2:17 PM Martinique E wrote: Patient's mother called back regarding this. Relayed most recent telephone encounter to mom and she stated it is all the medications that patient's PCP has prescribed that she needs switched over to the [facility].

## 2023-09-13 NOTE — Telephone Encounter (Signed)
 Went ahead and sent new prescriptions of all meds Waddell prescribes to [facility] since there is confusion.

## 2023-09-13 NOTE — Telephone Encounter (Signed)
 Pharmacy Patient Advocate Encounter   Received notification from CoverMyMeds that prior authorization for Ticagrelor  90MG  tablets is required/requested.   Insurance verification completed.   The patient is insured through [facility] .   Per test claim: PA required; PA started via CoverMyMeds. KEY [id number] . Waiting for clinical questions to populate.

## 2023-09-14 ENCOUNTER — Other Ambulatory Visit: Payer: Self-pay | Admitting: Family Medicine

## 2023-09-14 ENCOUNTER — Encounter (HOSPITAL_COMMUNITY): Payer: Self-pay | Admitting: Interventional Radiology

## 2023-09-14 DIAGNOSIS — R531 Weakness: Secondary | ICD-10-CM

## 2023-09-14 DIAGNOSIS — I639 Cerebral infarction, unspecified: Secondary | ICD-10-CM

## 2023-09-17 ENCOUNTER — Encounter

## 2023-09-17 ENCOUNTER — Other Ambulatory Visit

## 2023-10-01 ENCOUNTER — Telehealth: Payer: Self-pay | Admitting: Family Medicine

## 2023-10-01 NOTE — Telephone Encounter (Signed)
 Copied from CRM [id number]. Topic: General - Other >> Oct 01, 2023  3:47 PM Geroldine GRADE wrote: Reason for CRM: Patients mother Gailen is calling in to let us  know the patient will no longer be attending the office due to recent health problems she will be relocating to [place]    She would like a call to go over some information and the next steps [phone]

## 2023-10-02 ENCOUNTER — Telehealth: Payer: Self-pay

## 2023-10-02 ENCOUNTER — Other Ambulatory Visit (HOSPITAL_COMMUNITY): Payer: Self-pay

## 2023-10-02 NOTE — Telephone Encounter (Signed)
 FYI, I removed you as PCP

## 2023-10-02 NOTE — Telephone Encounter (Signed)
LVM to call back if needed. 

## 2023-10-02 NOTE — Telephone Encounter (Unsigned)
 Copied from CRM [id number]. Topic: General - Other >> Oct 02, 2023 11:28 AM Franky GRADE wrote: Reason for CRM: Patient's mother Gailen Aid is calling to thank the office for all the office has done for the patient. She is sad to be leaving the practice but is grateful for everything.

## 2023-10-02 NOTE — Telephone Encounter (Signed)
 Pharmacy Patient Advocate Encounter   Received notification from CoverMyMeds that prior authorization for Ticagrelor  90MG  tablets is required/requested.   Insurance verification completed.   The patient is insured through [facility]  .   Per test claim: PA required; PA submitted to above mentioned insurance via CoverMyMeds Key/confirmation #/[id number] Status is pending

## 2023-10-03 ENCOUNTER — Ambulatory Visit: Admitting: Family Medicine

## 2023-10-04 ENCOUNTER — Encounter

## 2023-10-04 ENCOUNTER — Other Ambulatory Visit

## 2023-10-04 ENCOUNTER — Other Ambulatory Visit (HOSPITAL_COMMUNITY): Payer: Self-pay

## 2023-10-04 NOTE — Telephone Encounter (Signed)
 Pharmacy Patient Advocate Encounter  Received notification from [facility] MEDICAID that Prior Authorization for Ticagrelor  90MG  tablets has been APPROVED for brand name Brilinta  90 mg tablets.   PA #/Case ID/Reference #: [id number]

## 2023-10-22 ENCOUNTER — Telehealth: Payer: Self-pay | Admitting: *Deleted

## 2023-10-22 DIAGNOSIS — I639 Cerebral infarction, unspecified: Secondary | ICD-10-CM

## 2023-10-22 DIAGNOSIS — I10 Essential (primary) hypertension: Secondary | ICD-10-CM

## 2023-10-22 NOTE — Progress Notes (Signed)
 Complex Care Management Note Care Guide Note  10/22/2023 Name: Brittany [name] MRN: [id number] DOB: Jun 09, 1965   Complex Care Management Outreach Attempts: An unsuccessful telephone outreach was attempted today to offer the patient information about available complex care management services.  Follow Up Plan:  Additional outreach attempts will be made to offer the patient complex care management information and services.   Encounter Outcome:  No Answer  Harlene Satterfield  [facility]  [facility], [facility] Guide  Direct Dial : [phone]  Fax [phone]

## 2023-10-23 NOTE — Progress Notes (Signed)
 Complex Care Management Note Care Guide Note  10/23/2023 Name: Brittany [name] MRN: [id number] DOB: April 15, 1965   Complex Care Management Outreach Attempts: A second unsuccessful outreach was attempted today to offer the patient with information about available complex care management services.  Follow Up Plan:  Additional outreach attempts will be made to offer the patient complex care management information and services.   Encounter Outcome:  No Answer  Harlene Satterfield  [facility]  [facility], [facility] Guide  Direct Dial : [phone]  Fax [phone]

## 2023-10-29 ENCOUNTER — Telehealth: Payer: Self-pay | Admitting: *Deleted

## 2023-10-29 NOTE — Progress Notes (Signed)
 Complex Care Management Note  Care Guide Note 10/29/2023 Name: Sheilla Maris MRN: [id number] DOB: 10-14-1965  Jachelle Fluty is a 58 y.o. year old female who sees No primary care provider on file. for primary care. I reached out to Chevelle Quick Ing by phone today to offer complex care management services.  Ms. Jungman was given information about Complex Care Management services today including:   The Complex Care Management services include support from the care team which includes your Nurse Care Manager, Clinical Social Worker, or Pharmacist.  The Complex Care Management team is here to help remove barriers to the health concerns and goals most important to you. Complex Care Management services are voluntary, and the patient may decline or stop services at any time by request to their care team member.   Complex Care Management Consent Status:  Patient is not eligible for CCM services she has moved to [place]  per mother and [facility] Medicaid was already notified by family.  Follow up plan:  None   Encounter Outcome: Patient not eligible.  Harlene Satterfield  [facility]  [facility], [facility] Guide  Direct Dial : [phone]  Fax [phone]

## 2023-10-29 NOTE — Progress Notes (Signed)
 Complex Care Management Note Care Guide Note  10/29/2023 Name: Brittany [name] MRN: [id number] DOB: 1966-02-01   Complex Care Management Outreach Attempts: A third unsuccessful outreach was attempted today to offer the patient with information about available complex care management services.  Follow Up Plan:  No further outreach attempts will be made at this time. We have been unable to contact the patient to offer or enroll patient in complex care management services.  Encounter Outcome:  No Answer  Harlene Satterfield  [facility]  [facility], [facility] Guide  Direct Dial : [phone]  Fax [phone]

## 2023-11-02 ENCOUNTER — Telehealth: Payer: Self-pay | Admitting: Family Medicine

## 2023-11-02 NOTE — Telephone Encounter (Signed)
 Okay to write letter

## 2023-11-02 NOTE — Telephone Encounter (Signed)
 Copied from CRM #[id number]. Topic: Medical Record Request - Other >> Nov 02, 2023  1:42 PM Deaijah H wrote: Reason for CRM: Patients mother called in stated Social Security needs letter with proof disability due to stroke, list of medications, and doctors she have seen for new doctor in [place]  due to them not knowing her history. Signed by Waddell Mon. Would like to have it by Tuesday. Please contact [phone]

## 2023-11-05 NOTE — Telephone Encounter (Signed)
 Patient's mother will pick up letter. At the front desk for pick up.

## 2023-11-05 NOTE — Telephone Encounter (Signed)
 Copied from CRM #[id number]. Topic: General - Other >> Nov 05, 2023 12:15 PM Mercedes MATSU wrote: Reason for CRM: Patients mother Josepine Quick called in requesting to speak with Jade. Patients mother is requesting a call back and can be reached at [phone]. She is also requesting to pick up some forms and wants to know when she can come and pick them up.

## 2023-11-05 NOTE — Telephone Encounter (Signed)
 Letter written. Called and LVM for mother letting her know, trying to find out how to get this to her. Encouraged her to call back and let us  know.

## 2023-11-07 NOTE — Telephone Encounter (Signed)
 Patient's mother requested more detail in note. Note addended at the front for pick up. Patient's mother made aware (LVM).

## 2023-12-07 ENCOUNTER — Encounter: Attending: Physical Medicine & Rehabilitation | Admitting: Physical Medicine & Rehabilitation

## 2024-01-01 ENCOUNTER — Other Ambulatory Visit: Payer: Self-pay | Admitting: Vascular Surgery

## 2024-01-01 DIAGNOSIS — M79606 Pain in leg, unspecified: Secondary | ICD-10-CM

## 2024-01-23 ENCOUNTER — Ambulatory Visit: Admitting: Vascular Surgery

## 2024-01-23 ENCOUNTER — Ambulatory Visit (HOSPITAL_COMMUNITY)

## 2024-02-25 NOTE — Progress Notes (Deleted)
 [facility] [address] [place]. [place] [address] [phone]       FOLLOW UP NOTE  [name] Date of Birth:  [birth year] Medical Record Number:  [id number]   Referring MD:  Laneta Calk, NP Reason for visit: Stroke follow-up   HPI:   Update 02/26/2024 JM: Patient is being seen for stroke follow-up.         Update 08/01/2023 Dr. Rosemarie: Brittany [name] is a 58 year old African-American lady seen today for initial office follow-up visit following hospital admission for stroke. She is accompanied by her father. History is obtained from them and review of electronic medical records and I personally reviewed pertinent available imaging films in PACS. 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 She was found to have left MCA territory stroke with severe left MCA stenosis. She had previously had a left cerebellar stroke with minimal residual deficits. On 04/16/2023 the patient developed significant neurological worsening with acute onset right gaze deviation and an inability to follow commands and global aphasia. CT angiogram was obtained emergently which showed proximal left M1 occlusion. She was taken for emergent mechanical thrombectomy after discussion of risk benefits and alternatives with her son. She underwent complete revascularization of the left MCA but it required placement of rescue left MCA stent. She subsequently developed acute DVT in the right leg was transition from aspirin  Brilinta  to Brilinta  and initially heparin  and then Eliquis . 2D echo showed ejection fraction of 60 to 65% with moderate dilated left atrium. LDL cholesterol 124 mg percent. Hemoglobin A1c was 5.9. Urine drug screen was positive  for marijuana. Patient has finished rehab and is presently living at home with her father. She is still has significant aphasia and is able to speak only a few words and occasional short sentences. She can understand simple midline and one-step commands. She is able to ambulate independently though she has mild weakness of the right hand right foot. She uses a walker for ambulation. She has had no falls or injuries. She remains on Eliquis  and Brilinta  which she is tolerating well with increased bruising but no bleeding episodes. She currently requires 24-hour supervision at home but she can get up and go to the restroom by herself and even change her clothes and make herself a meal.    Update 11/19/2018: [name] is being seen today for ongoing neuropathy management along with past history of left PICA stroke in 11/2016.  Lyrica  previously initiated with mild improvement therefore recommended increasing dose.  Due to left arm radiculopathy symptoms, MR cervical spine obtained on 11/05/2018 which showed multiple levels of disc bulging and moderate to severe foraminal stenosis.  She was referred to orthopedics for further evaluation and management with appointment scheduled in September.  She does continue to have numbness/tingling and increasing weakness compared to baseline left-sided weakness post stroke.  She states occasionally her hand will just give out on her and dropped object that she is holding.  She has continued on Lyrica  75 mg 3 times daily with initial mild benefit for the first 2 weeks but then symptoms returned.  She believes she had better benefit with use of gabapentin  and requests restart of gabapentin .  She was unable to tolerate amitriptyline  and duloxetine  due to anticholinergic side effects.  She remains on Robaxin .  She  continues to ambulate with a Rollator walker and is requesting use of a smaller walker as she has difficulty maneuvering current walker at her house.  She is also requesting  shower bench.  She continues to work with physical therapy for ongoing gait difficulties.  Neuropathy pain managed by Dr. Tobie at physical medicine rehab but due to COVID-19, she has not had recent follow-up visit.  She has been stable from a stroke standpoint with continuation of aspirin  325 mg daily and atorvastatin  40 mg daily without side effects.  Blood pressure today satisfactory at 127/84.  No further concerns at this time.  Update virtual visit 08/05/2018: Ms Brittany [name] is a 89 year pleasant African-American lady who was initially scheduled today for follow-up visit regarding neuropathy but due to COVID-19 safety precautions, visit transition to telemedicine via [email or web address] with patient's consent.   She endorses significant paresthesias and discomfort in her feet, hands bilaterally and left arm > right arm which has been present for greater than 10 years.  She did undergo EMG which did not show evidence of peripheral neuropathy but did show mild bilateral carpal tunnel syndrome. Patient was admitted to [facility] in September 2018 with dizziness and was found to have left posterior inferior cerebellar artery infarct which was evidenced on MRI. Initial visit on 01/23/2018 with Dr. Rosemarie and recommended initiating Topamax  and continue gabapentin  which she was previously on.  Neuropathy panel and EMG unremarkable except for mild bilateral carpal tunnel syndrome.  She unfortunately was unable to tolerate Topamax  due to memory side effects and therefore recommended initiating Lyrica  at prior office visit on 04/29/2018 with discontinuing gabapentin .  At today's visit, she does endorse mild benefit with use of Lyrica  but does continue to experience paresthesias.  She does endorse occasional subjective weakness of left hand where she will frequently drop items.  Left arm pain has been worsening where she feels a burning and numbness sensation from her shoulder down to her fingertips with left-sided neck,  trapezius and shoulder pain.  This pain has been present for as long as I can remember but has recently only been worsening.    She does endorse previously trying physical therapy for this neck pain but did not gain much benefit.  She unfortunately has not used wrist splints as recommended for carpal tunnel syndrome as she is currently on disability and unable to afford them.  She declined interest in release procedure at this time.She also endorses left>right bilateral lower extremity swelling and occasional left wrist/hand swelling.  She continues to work with her PCP in regards to the swelling and is currently participating in therapy but has not gained much benefit.    ROS:   14 system review of systems is positive for dizziness, numbness, weakness, joint pain, joint swelling, back pain, ecchymosis, muscle cramps, neck pain, neck stiffness, moles, itching, frequency of urination, abdominal pain, constipation, shortness of breath, chest pain, leg swelling, double vision, blurred vision, fatigue and excessive sweating and all other systems negative  PMH:  Past Medical History:  Diagnosis Date   Anemia    Arthritis    trigger finger in left hand   Complication of anesthesia    per pt, hard to wake up!   Elevated cholesterol    Female bladder prolapse    per urologist, does not have prolaspe   GERD (gastroesophageal reflux disease)    Heart murmur    pt unsure.   Hiatal hernia    Hypertension    IBS (irritable  bowel syndrome)    Multiple myeloma (HCC) 2005   had partial chemo   Peripheral neuropathy    SOB (shortness of breath) on exertion    uses an inhaler   Stroke (HCC) 2017   paralysis left arm/uses a walker    Social History:  Social History   Socioeconomic History   Marital status: Legally Separated    Spouse name: Not on file   Number of children: Not on file   Years of education: Not on file   Highest education level: Not on file  Occupational History    Occupation: Disabled  Tobacco Use   Smoking status: Former    Current packs/day: 0.00    Types: Cigarettes    Quit date: 12/06/2015    Years since quitting: 8.2   Smokeless tobacco: Never  Vaping Use   Vaping status: Never Used  Substance and Sexual Activity   Alcohol use: Not Currently   Drug use: Not Currently    Types: Marijuana    Comment: in the past    Sexual activity: Not Currently  Other Topics Concern   Not on file  Social History Narrative   Not on file   Social Drivers of Health   Financial Resource Strain: Not on file  Food Insecurity: Food Insecurity Present (04/12/2023)   Hunger Vital Sign    Worried About Running Out of Food in the Last Year: Sometimes true    Ran Out of Food in the Last Year: Never true  Transportation Needs: Unmet Transportation Needs (04/12/2023)   PRAPARE - Administrator, Civil Service (Medical): Yes    Lack of Transportation (Non-Medical): Yes  Physical Activity: Not on file  Stress: Not on file  Social Connections: Not on file  Intimate Partner Violence: Not At Risk (04/12/2023)   Humiliation, Afraid, Rape, and Kick questionnaire    Fear of Current or Ex-Partner: No    Emotionally Abused: No    Physically Abused: No    Sexually Abused: No    Medications:   Current Outpatient Medications on File Prior to Visit  Medication Sig Dispense Refill   amLODipine  (NORVASC ) 10 MG tablet Take 1 tablet (10 mg total) by mouth daily. 90 tablet 1   apixaban  (ELIQUIS ) 5 MG TABS tablet Take 1 tablet (5 mg total) by mouth 2 (two) times daily. 180 tablet 1   atorvastatin  (LIPITOR ) 80 MG tablet Take 1 tablet (80 mg total) by mouth daily with supper. 90 tablet 1   cholecalciferol  (CHOLECALCIFEROL ) 25 MCG tablet Take 1 tablet (1,000 Units total) by mouth daily.     docusate sodium  (COLACE) 100 MG capsule Take 1 capsule (100 mg total) by mouth 2 (two) times daily. 60 capsule 0   ezetimibe  (ZETIA ) 10 MG tablet Take 1 tablet (10 mg total) by  mouth daily. 90 tablet 1   gabapentin  (NEURONTIN ) 300 MG capsule Take 1 capsule (300 mg total) by mouth 3 (three) times daily. 270 capsule 1   hydrALAZINE  (APRESOLINE ) 50 MG tablet Take 1 tablet (50 mg total) by mouth every 8 (eight) hours. 90 tablet 0   linaclotide  (LINZESS ) 72 MCG capsule Take 1 capsule (72 mcg total) by mouth every 3 (three) days. 30 capsule 0   lisinopril  (ZESTRIL ) 2.5 MG tablet Take 1 tablet (2.5 mg total) by mouth in the morning and at bedtime. 180 tablet 1   pantoprazole  (PROTONIX ) 40 MG tablet Take 1 tablet (40 mg total) by mouth daily. 90 tablet 1   potassium  chloride (KLOR-CON ) 10 MEQ tablet Take 2 tablets (20 mEq total) by mouth 2 (two) times daily. 360 tablet 1   senna-docusate (SENOKOT-S) 8.6-50 MG tablet Take 2 tablets by mouth daily at 6 (six) AM. 60 tablet 1   ticagrelor  (BRILINTA ) 90 MG TABS tablet Take 1 tablet (90 mg total) by mouth 2 (two) times daily. 180 tablet 1   valACYclovir  (VALTREX ) 500 MG tablet Take 1 tablet (500 mg total) by mouth 2 (two) times daily. 6 tablet 0   No current facility-administered medications on file prior to visit.    Allergies:   Allergies  Allergen Reactions   Amitriptyline  Other (See Comments)    Patient reported that it made her throat feel like its locking up and it also caused issues with her going to the bathroom   Duloxetine  Other (See Comments)    Patient reported that it made her throat lock up and it caused her to have issue with going to the bathroom   Naproxen      Vomiting, sweating, abd spasms   Other     States can't take pain meds that end in cet or meds that end in ine Darvocet/severe vomiting   Bovine (Beef) Protein-Containing Drug Products     Patient has IBS prefers no beef   Porcine (Pork) Protein-Containing Drug Products     Patient has IBS prefers no pork   Darvon [Propoxyphene] Nausea And Vomiting   Hydrocodone  Nausea And Vomiting   Lactose Intolerance (Gi)     Bloating, gas, abd pain   Latex  Itching and Rash   Oxycodone  Nausea And Vomiting   Percocet [Oxycodone -Acetaminophen ] Nausea And Vomiting   Topamax  [Topiramate ]     Memory made her emotional     There were no vitals filed for this visit.  There is no height or weight on file to calculate BMI.  Physical Exam General: well developed, well nourished,  pleasant middle-aged African-American female, seated, in no evident distress Head: head normocephalic and atraumatic.   Neck: supple with no carotid or supraclavicular bruits Cardiovascular: regular rate and rhythm, no murmurs Musculoskeletal: no deformity Skin:  no rash/petichiae Vascular:  Normal pulses all extremities   Neurologic Exam Mental Status: Awake and fully alert. Oriented to place and time. Recent and remote memory intact. Attention span, concentration and fund of knowledge appropriate. Mood and affect appropriate.  Cranial Nerves: Pupils equal, briskly reactive to light. Extraocular movements full without nystagmus. Visual fields full to confrontation. Hearing intact. Facial sensation intact. Face, tongue, palate moves normally and symmetrically.  Motor: Normal bulk and tone.  Chronic left hemiparesis from prior stroke with subjective worsening at times due to cervical radiculopathy Sensory.:  Decreased sensation BLE distally and LUE Coordination: Rapid alternating movements normal in all extremities except slightly diminished in left hand. Finger-to-nose and heel-to-shin performed accurately bilaterally. Gait and Station: Arises from chair without difficulty. Stance is normal. Gait demonstrates  broad-based gait with use of Rollator walker Reflexes: 1+ and symmetric. Toes downgoing.          ASSESSMENT: 58 year old lady with L MCA stroke in 03/2023 s/p L MCA stent and placed on Brilinta  with residual aphasia and mild right hemiparesis.  Also found to have RLE DVT 03/2023 and placed on Eliquis . Remote history of left posterior inferior cerebellar artery  infarct of cryptogenic etiology in September 2018 without residual deficits.  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  severe neuropathic pain from chronic small fiber sensory peripheral neuropathy likely  of idiopathic origin.   Vascular risk factors of hypertension, hyperlipidemia and patent foramen ovale.  Mild bilateral carpal tunnel syndrome.  Due to complaints of left arm radiculopathy, MR cervical spine obtained which showed multiple levels of disc bulging and moderate to severe foraminal stenosis therefore referred to orthopedics for further management.  Difficulty tolerating duloxetine  and amitriptyline  due to anticholinergic side effects.     PLAN: -Denies benefit with use of Lyrica  and request return to gabapentin .  Lyrica  discontinued and order placed for gabapentin  300 mg 3 times daily (previously on 600 mg 3 times daily but advised her that dosage will slowly have to be increased) -Discontinued amitriptyline  and duloxetine  due to anticholinergic side effects -Initiate venlafaxine  37.5 mg for 7 days and then increase to 75 mg daily for ongoing neuropathic pain -Initial orthopedic evaluation scheduled in September for left arm likely cervical radiculopathy -Recommend rescheduling follow-up visit with Dr. Tobie as she should continue to follow with him for ongoing pain management -this was discussed with patient and verbalized agreement -Continue aspirin  and atorvastatin  for secondary stroke prevention -Requesting different walker and shower chair -  will follow-up in regards to if this was sent to physical medicine rehab by therapy prior to ordering due to ongoing balance difficulties secondary to neuropathy and residual stroke deficits  Follow-up in 4 months or call earlier if needed      Harlene Whitfield Reaper), AGNP-BC  [facility] [address] [place], [place] [address]  Phone [phone] Fax  [phone] Note: This document was prepared with digital dictation and possible smart phrase technology. Any transcriptional errors that result from this process are unintentional.

## 2024-02-26 ENCOUNTER — Encounter: Payer: Self-pay | Admitting: Adult Health

## 2024-02-26 ENCOUNTER — Ambulatory Visit: Admitting: Adult Health

## 2024-03-04 ENCOUNTER — Telehealth: Payer: Self-pay | Admitting: Neurology

## 2024-03-04 NOTE — Telephone Encounter (Signed)
 Patient's mother notified patient no longer living in state of [place] . Can cancel any appointments she may have.
